# Patient Record
Sex: Male | Born: 1960
Health system: Southern US, Community
[De-identification: ages and names within clinical notes are randomized; demographics above are authoritative.]

## PROBLEM LIST (undated history)

## (undated) DIAGNOSIS — I10 Essential (primary) hypertension: Secondary | ICD-10-CM

## (undated) DIAGNOSIS — F1910 Other psychoactive substance abuse, uncomplicated: Secondary | ICD-10-CM

## (undated) DIAGNOSIS — Z972 Presence of dental prosthetic device (complete) (partial): Secondary | ICD-10-CM

## (undated) DIAGNOSIS — G8929 Other chronic pain: Secondary | ICD-10-CM

## (undated) DIAGNOSIS — E7800 Pure hypercholesterolemia, unspecified: Secondary | ICD-10-CM

## (undated) DIAGNOSIS — K5100 Ulcerative (chronic) pancolitis without complications: Secondary | ICD-10-CM

## (undated) DIAGNOSIS — M7918 Myalgia, other site: Secondary | ICD-10-CM

## (undated) DIAGNOSIS — N189 Chronic kidney disease, unspecified: Secondary | ICD-10-CM

## (undated) HISTORY — DX: Presence of dental prosthetic device (complete) (partial): Z97.2

## (undated) HISTORY — PX: MULTIPLE TOOTH EXTRACTIONS: SHX2053

---

## 2011-08-31 ENCOUNTER — Emergency Department (HOSPITAL_COMMUNITY)
Admission: EM | Admit: 2011-08-31 | Discharge: 2011-08-31 | Disposition: A | Discharge status: Home / Self Care | Payer: Self-pay | Attending: Emergency Medicine | Admitting: Emergency Medicine

## 2011-08-31 ENCOUNTER — Encounter (HOSPITAL_COMMUNITY): Payer: Self-pay | Admitting: Emergency Medicine

## 2011-08-31 DIAGNOSIS — F1010 Alcohol abuse, uncomplicated: Secondary | ICD-10-CM | POA: Insufficient documentation

## 2011-08-31 DIAGNOSIS — E7800 Pure hypercholesterolemia, unspecified: Secondary | ICD-10-CM | POA: Insufficient documentation

## 2011-08-31 DIAGNOSIS — F10929 Alcohol use, unspecified with intoxication, unspecified: Secondary | ICD-10-CM

## 2011-08-31 DIAGNOSIS — I10 Essential (primary) hypertension: Secondary | ICD-10-CM | POA: Insufficient documentation

## 2011-08-31 DIAGNOSIS — Y92410 Unspecified street and highway as the place of occurrence of the external cause: Secondary | ICD-10-CM | POA: Insufficient documentation

## 2011-08-31 HISTORY — DX: Essential (primary) hypertension: I10

## 2011-08-31 HISTORY — DX: Pure hypercholesterolemia, unspecified: E78.00
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
## 2012-10-15 ENCOUNTER — Encounter (HOSPITAL_COMMUNITY): Payer: Self-pay

## 2012-10-15 ENCOUNTER — Emergency Department (HOSPITAL_COMMUNITY)
Admission: EM | Admit: 2012-10-15 | Discharge: 2012-10-15 | Disposition: A | Discharge status: Home Health | Payer: Self-pay | Source: Home / Self Care

## 2012-10-15 DIAGNOSIS — G589 Mononeuropathy, unspecified: Secondary | ICD-10-CM

## 2012-10-15 DIAGNOSIS — G629 Polyneuropathy, unspecified: Secondary | ICD-10-CM

## 2012-10-15 MED ORDER — PREGABALIN 50 MG PO CAPS: 50.0000 mg | ORAL_CAPSULE | Freq: Two times a day (BID) | ORAL | Status: DC

## 2012-10-15 NOTE — ED Provider Notes (Signed)
History     CSN: [id number]  Arrival date & time 10/15/12  1511   First MD Initiated Contact with Patient 10/15/12 1528      Chief Complaint  Patient presents with  . Leg Pain  . Foot Pain    (Consider location/radiation/quality/duration/timing/severity/associated sxs/prior treatment) HPI Patient is 52 year old male who presents to clinic with main concern of lower extremity numbness and tingling that initially started one year prior to this visit. He explains that this gets worse with ambulation and prolonged standing and somewhat relieved with rest. Patient denies any specific focal neurological symptoms, no recent traumas to the areas, no drug use, no medicines over-the-counter, no alcohol or tobacco use. Patient denies fevers and chills, no chest pain or shortness of breath, no abdominal or urinary concerns. Past Medical History  Diagnosis Date  . Hypertension   . High cholesterol     History reviewed. No pertinent past surgical history.  Family history of high blood pressure  History  Substance Use Topics  . Smoking status: Current Some Day Smoker  . Smokeless tobacco: Not on file  . Alcohol Use: Yes      Review of Systems  Constitutional: Negative for fever, chills, diaphoresis, activity change, appetite change and fatigue.  HENT: Negative for ear pain, nosebleeds, congestion, facial swelling, rhinorrhea, neck pain, neck stiffness and ear discharge.   Eyes: Negative for pain, discharge, redness, itching and visual disturbance.  Respiratory: Negative for cough, choking, chest tightness, shortness of breath, wheezing and stridor.   Cardiovascular: Negative for chest pain, palpitations and leg swelling.  Gastrointestinal: Negative for abdominal distention.  Genitourinary: Negative for dysuria, urgency, frequency, hematuria, flank pain, decreased urine volume, difficulty urinating and dyspareunia.  Musculoskeletal: Negative for back pain, joint swelling, arthralgias  and gait problem.  Neurological: Negative for dizziness, tremors, seizures, syncope, facial asymmetry, speech difficulty, weakness, light-headedness, and headaches.  Hematological: Negative for adenopathy. Does not bruise/bleed easily.  Psychiatric/Behavioral: Negative for hallucinations, behavioral problems, confusion, dysphoric mood, decreased concentration and agitation.    Allergies  Review of patient's allergies indicates no known allergies.  Home Medications   Current Outpatient Rx  Name  Route  Sig  Dispense  Refill  . acetaminophen (TYLENOL) 500 MG tablet   Oral   Take 1,000 mg by mouth every 6 (six) hours as needed. For pain         . Multiple Vitamin (MULITIVITAMIN WITH MINERALS) TABS   Oral   Take 1 tablet by mouth daily.         [name] OVER THE COUNTER MEDICATION   Both Eyes   Place 1-2 drops into both eyes daily as needed. For dry/red eyes  [employer] eye drops         . pregabalin (LYRICA) 50 MG capsule   Oral   Take 1 capsule (50 mg total) by mouth 2 (two) times daily.   60 capsule   1     BP 110/78  Pulse 83  Temp(Src) 98 F (36.7 C) (Oral)  Resp 16  SpO2 100%  Physical Exam  Constitutional: Appears well-developed and well-nourished. No distress.  HENT: Normocephalic. External right and left ear normal. Oropharynx is clear and moist.  Eyes: Conjunctivae and EOM are normal. PERRLA, no scleral icterus.  Neck: Normal ROM. Neck supple. No JVD. No tracheal deviation. No thyromegaly.  CVS: RRR, S1/S2 +, no murmurs, no gallops, no carotid bruit.  Pulmonary: Effort and breath sounds normal, no stridor, rhonchi, wheezes, rales.  Abdominal: Soft. BS +,  no distension, tenderness, rebound or guarding.  Musculoskeletal: Normal range of motion. No edema and no tenderness.  Lymphadenopathy: No lymphadenopathy noted, cervical, inguinal. Neuro: Alert. Normal reflexes, muscle tone coordination. No cranial nerve deficit. Skin: Skin is warm and dry. No rash  noted. Not diaphoretic. No erythema. No pallor.  Psychiatric: Normal mood and affect. Behavior, judgment, thought content normal.    ED Course  Procedures (including critical care time)  Labs Reviewed - No data to display No results found.   1. Neuropathy   - patient symptoms suggestive of neuropathy but unclear foot provoking factor is - Referral to neurology will be made, patient started on Lyrica to take twice daily for now    MDM  Neuropathy        Dorothea Ogle, MD 10/15/12 [phone]

## 2012-10-15 NOTE — ED Notes (Signed)
Patient complains of pain in both legs and feet Has been going on for about 6 months

## 2012-10-16 NOTE — ED Notes (Signed)
Patient has an appt [facility] June 26 @ 1:45pm

## 2012-12-14 ENCOUNTER — Emergency Department (HOSPITAL_COMMUNITY)
Admission: EM | Admit: 2012-12-14 | Discharge: 2012-12-14 | Disposition: A | Discharge status: Home / Self Care | Payer: Self-pay | Attending: Emergency Medicine | Admitting: Emergency Medicine

## 2012-12-14 ENCOUNTER — Encounter (HOSPITAL_COMMUNITY): Payer: Self-pay | Admitting: *Deleted

## 2012-12-14 DIAGNOSIS — I10 Essential (primary) hypertension: Secondary | ICD-10-CM | POA: Insufficient documentation

## 2012-12-14 DIAGNOSIS — M795 Residual foreign body in soft tissue: Secondary | ICD-10-CM | POA: Insufficient documentation

## 2012-12-14 DIAGNOSIS — E7800 Pure hypercholesterolemia, unspecified: Secondary | ICD-10-CM | POA: Insufficient documentation

## 2012-12-14 DIAGNOSIS — F17200 Nicotine dependence, unspecified, uncomplicated: Secondary | ICD-10-CM | POA: Insufficient documentation

## 2012-12-14 MED ORDER — CEPHALEXIN 500 MG PO CAPS: 500.0000 mg | ORAL_CAPSULE | Freq: Four times a day (QID) | ORAL | Status: DC

## 2012-12-14 MED ORDER — IBUPROFEN 800 MG PO TABS: 800.0000 mg | ORAL_TABLET | Freq: Three times a day (TID) | ORAL | Status: DC

## 2012-12-14 MED ORDER — TETANUS-DIPHTH-ACELL PERTUSSIS 5-2.5-18.5 LF-MCG/0.5 IM SUSP
0.5000 mL | Freq: Once | INTRAMUSCULAR | Status: AC
  Administered 2012-12-14: 0.5 mL via INTRAMUSCULAR
  Filled 2012-12-14: qty 0.5

## 2012-12-14 NOTE — ED Provider Notes (Signed)
History     CSN: [id number]  Arrival date & time 12/14/12  0435   First MD Initiated Contact with Patient 12/14/12 0544      Chief Complaint  Patient presents with  . Puncture Wound    (Consider location/radiation/quality/duration/timing/severity/associated sxs/prior treatment) HPI HX per PT - getting ready to go fishing ant got a fish hook stuck in his R thumb - unable to remove it at home. Sharp mod pain not radiating, last tet unk, no other injury.  Past Medical History  Diagnosis Date  . Hypertension   . High cholesterol     No past surgical history on file.  No family history on file.  History  Substance Use Topics  . Smoking status: Current Some Day Smoker  . Smokeless tobacco: Not on file  . Alcohol Use: Yes      Review of Systems  Constitutional: Negative for fever and chills.  HENT: Negative for neck pain and neck stiffness.   Respiratory: Negative for shortness of breath.   Cardiovascular: Negative for chest pain.  Gastrointestinal: Negative for abdominal pain.  Musculoskeletal: Negative for back pain.  Skin: Positive for wound. Negative for rash.  Neurological: Negative for headaches.  All other systems reviewed and are negative.    Allergies  Review of patient's allergies indicates no known allergies.  Home Medications   Current Outpatient Rx  Name  Route  Sig  Dispense  Refill  . cephALEXin (KEFLEX) 500 MG capsule   Oral   Take 1 capsule (500 mg total) by mouth 4 (four) times daily.   40 capsule   0   . ibuprofen (ADVIL,MOTRIN) 800 MG tablet   Oral   Take 1 tablet (800 mg total) by mouth 3 (three) times daily.   21 tablet   0     BP 100/62  Pulse 95  Temp(Src) 97.3 F (36.3 C) (Oral)  Resp 14  SpO2 95%  Physical Exam  Constitutional: He is oriented to person, place, and time. He appears well-developed and well-nourished.  HENT:  Head: Normocephalic and atraumatic.  Eyes: EOM are normal. Pupils are equal, round, and  reactive to light.  Neck: Trachea normal. Neck supple.  Cardiovascular: Normal rate, regular rhythm, S1 normal, S2 normal, intact distal pulses and normal pulses.   Pulses:      Radial pulses are 2+ on the right side, and 2+ on the left side.  Pulmonary/Chest: Effort normal and breath sounds normal. No respiratory distress. He has no wheezes. He has no rhonchi. He has no rales. He exhibits no tenderness.  Abdominal: Soft. Normal appearance and bowel sounds are normal. There is no tenderness. There is no CVA tenderness and negative Murphy's sign.  Musculoskeletal:  R thumb with barbed fish hook imbedded in soft tissue distal digit. Cap refill and sensorium intact.  Neurological: He is alert and oriented to person, place, and time.  Skin: Skin is warm and dry. No rash noted. He is not diaphoretic.  Psychiatric: His speech is normal.  Cooperative and appropriate    ED Course  FOREIGN BODY REMOVAL Date/Time: 12/14/2012 6:35 AM Performed by: Sunnie Nielsen Authorized by: Sunnie Nielsen Consent: Verbal consent obtained. Risks and benefits: risks, benefits and alternatives were discussed Consent given by: patient Patient understanding: patient states understanding of the procedure being performed Patient consent: the patient's understanding of the procedure matches consent given Procedure consent: procedure consent matches procedure scheduled Required items: required blood products, implants, devices, and special equipment available Patient identity confirmed: verbally  with patient Time out: Immediately prior to procedure a "time out" was called to verify the correct patient, procedure, equipment, support staff and site/side marked as required. Intake: R thumb. Anesthesia: local infiltration Local anesthetic: lidocaine 1% without epinephrine Anesthetic total: 2 ml Patient cooperative: yes Complexity: simple 1 objects recovered. Objects recovered: fish hook Post-procedure assessment: foreign  body removed Patient tolerance: Patient tolerated the procedure well with no immediate complications.   (including critical care time)  Hook cut with pliers and advanced thru skin - removed without difficulty  1. Soft tissues foreign body     Infx precautions, ABx, PT instructed to keep wound clean, dry and covered.   MDM  R thumb FB removed  Tetanus updated  VS and nursing notes reviewed/ considered        Sunnie Nielsen, MD 12/14/12 [phone]

## 2012-12-14 NOTE — ED Notes (Signed)
Got a fishing line hook stuck in his rt. Thumb, dorsal side. When getting in the truck. No bleeding.

## 2013-06-06 ENCOUNTER — Ambulatory Visit: Payer: Self-pay

## 2013-08-13 ENCOUNTER — Emergency Department (HOSPITAL_COMMUNITY)
Admission: EM | Admit: 2013-08-13 | Discharge: 2013-08-13 | Disposition: A | Discharge status: Home / Self Care | Payer: Self-pay | Attending: Emergency Medicine | Admitting: Emergency Medicine

## 2013-08-13 ENCOUNTER — Emergency Department (HOSPITAL_COMMUNITY): Payer: Self-pay

## 2013-08-13 ENCOUNTER — Encounter (HOSPITAL_COMMUNITY): Payer: Self-pay | Admitting: Emergency Medicine

## 2013-08-13 DIAGNOSIS — E7800 Pure hypercholesterolemia, unspecified: Secondary | ICD-10-CM | POA: Insufficient documentation

## 2013-08-13 DIAGNOSIS — R109 Unspecified abdominal pain: Secondary | ICD-10-CM | POA: Insufficient documentation

## 2013-08-13 DIAGNOSIS — F17200 Nicotine dependence, unspecified, uncomplicated: Secondary | ICD-10-CM | POA: Insufficient documentation

## 2013-08-13 LAB — CBC WITH DIFFERENTIAL/PLATELET
Basophils Absolute: 0 10*3/uL (ref 0.0–0.1)
Basophils Relative: 0 % (ref 0–1)
Eosinophils Absolute: 0.1 10*3/uL (ref 0.0–0.7)
Eosinophils Relative: 1 % (ref 0–5)
HCT: 35.8 % — ABNORMAL LOW (ref 39.0–52.0)
Hemoglobin: 12.3 g/dL — ABNORMAL LOW (ref 13.0–17.0)
Lymphocytes Relative: 21 % (ref 12–46)
Lymphs Abs: 1.9 10*3/uL (ref 0.7–4.0)
MCH: 32.5 pg (ref 26.0–34.0)
MCHC: 34.4 g/dL (ref 30.0–36.0)
MCV: 94.5 fL (ref 78.0–100.0)
Monocytes Absolute: 0.9 10*3/uL (ref 0.1–1.0)
Monocytes Relative: 10 % (ref 3–12)
Neutro Abs: 6.2 10*3/uL (ref 1.7–7.7)
Neutrophils Relative %: 68 % (ref 43–77)
Platelets: 161 10*3/uL (ref 150–400)
RBC: 3.79 MIL/uL — ABNORMAL LOW (ref 4.22–5.81)
RDW: 14.8 % (ref 11.5–15.5)
WBC: 9.1 10*3/uL (ref 4.0–10.5)

## 2013-08-13 LAB — URINALYSIS, ROUTINE W REFLEX MICROSCOPIC
Bilirubin Urine: NEGATIVE
Glucose, UA: NEGATIVE mg/dL
Hgb urine dipstick: NEGATIVE
Ketones, ur: NEGATIVE mg/dL
Leukocytes, UA: NEGATIVE
Nitrite: NEGATIVE
Protein, ur: NEGATIVE mg/dL
Specific Gravity, Urine: 1.012 (ref 1.005–1.030)
Urobilinogen, UA: 0.2 mg/dL (ref 0.0–1.0)
pH: 5.5 (ref 5.0–8.0)

## 2013-08-13 LAB — COMPREHENSIVE METABOLIC PANEL
ALT: 246 U/L — ABNORMAL HIGH (ref 0–53)
AST: 424 U/L — ABNORMAL HIGH (ref 0–37)
Albumin: 3.2 g/dL — ABNORMAL LOW (ref 3.5–5.2)
Alkaline Phosphatase: 32 U/L — ABNORMAL LOW (ref 39–117)
BUN: 11 mg/dL (ref 6–23)
CO2: 21 mEq/L (ref 19–32)
Calcium: 8.1 mg/dL — ABNORMAL LOW (ref 8.4–10.5)
Chloride: 88 mEq/L — ABNORMAL LOW (ref 96–112)
Creatinine, Ser: 0.68 mg/dL (ref 0.50–1.35)
GFR calc Af Amer: >90 mL/min (ref >90)
GFR calc non Af Amer: >90 mL/min (ref >90)
Glucose, Bld: 117 mg/dL — ABNORMAL HIGH (ref 70–99)
Potassium: 7.7 mEq/L (ref 3.7–5.3)
Sodium: 127 mEq/L — ABNORMAL LOW (ref 137–147)
Total Bilirubin: 1.1 mg/dL (ref 0.3–1.2)
Total Protein: 8.5 g/dL — ABNORMAL HIGH (ref 6.0–8.3)

## 2013-08-13 LAB — LIPASE, BLOOD: Lipase: 69 U/L — ABNORMAL HIGH (ref 11–59)

## 2013-08-13 MED ORDER — OXYCODONE-ACETAMINOPHEN 5-325 MG PO TABS: 1.0000 | ORAL_TABLET | ORAL | Status: DC | PRN

## 2013-08-13 MED ORDER — MORPHINE SULFATE 4 MG/ML IJ SOLN
4.0000 mg | Freq: Once | INTRAMUSCULAR | Status: AC
  Administered 2013-08-13: 4 mg via INTRAVENOUS
  Filled 2013-08-13: qty 1

## 2013-08-13 MED ORDER — SODIUM CHLORIDE 0.9 % IV BOLUS (SEPSIS)
1000.0000 mL | Freq: Once | INTRAVENOUS | Status: AC
  Administered 2013-08-13: 1000 mL via INTRAVENOUS

## 2013-08-13 MED ORDER — ONDANSETRON HCL 4 MG/2ML IJ SOLN
4.0000 mg | Freq: Once | INTRAMUSCULAR | Status: AC
  Administered 2013-08-13: 4 mg via INTRAVENOUS
  Filled 2013-08-13: qty 2

## 2013-08-13 MED ORDER — ACETAMINOPHEN 325 MG PO TABS
650.0000 mg | ORAL_TABLET | Freq: Once | ORAL | Status: AC
  Administered 2013-08-13: 650 mg via ORAL
  Filled 2013-08-13: qty 2

## 2013-08-13 NOTE — ED Notes (Signed)
Pt given urinal and made aware of need for urine specimen 

## 2013-08-13 NOTE — ED Notes (Signed)
Pt still unable to void at this time 

## 2013-08-13 NOTE — Discharge Instructions (Signed)
Flank Pain Flank pain refers to pain that is located on the side of the body between the upper abdomen and the back. The pain may occur over a short period of time (acute) or may be long-term or reoccurring (chronic). It may be mild or severe. Flank pain can be caused by many things. CAUSES  Some of the more common causes of flank pain include:  Muscle strains.   Muscle spasms.   A disease of your spine (vertebral disk disease).   A lung infection (pneumonia).   Fluid around your lungs (pulmonary edema).   A kidney infection.   Kidney stones.   A very painful skin rash caused by the chickenpox virus (shingles).   Gallbladder disease.  [place] care will depend on the cause of your pain. In general,  Rest as directed by your caregiver.  Drink enough fluids to keep your urine clear or pale yellow.  Only take over-the-counter or prescription medicines as directed by your caregiver. Some medicines may help relieve the pain.  Tell your caregiver about any changes in your pain.  Follow up with your caregiver as directed. SEEK IMMEDIATE MEDICAL CARE IF:   Your pain is not controlled with medicine.   You have new or worsening symptoms.  Your pain increases.   You have abdominal pain.   You have shortness of breath.   You have persistent nausea or vomiting.   You have swelling in your abdomen.   You feel faint or pass out.   You have blood in your urine.  You have a fever or persistent symptoms for more than 2 3 days.  You have a fever and your symptoms suddenly get worse. MAKE SURE YOU:   Understand these instructions.  Will watch your condition.  Will get help right away if you are not doing well or get worse. Document Released: 08/10/2005 Document Revised: 03/13/2012 Document Reviewed: 02/01/2012 [facility] Patient Information 2014 [place].   Emergency Department Resource Guide 1) Find a Doctor and Pay Out of  Pocket Although you won't have to find out who is covered by your insurance plan, it is a good idea to ask around and get recommendations. You will then need to call the office and see if the doctor you have chosen will accept you as a new patient and what types of options they offer for patients who are self-pay. Some doctors offer discounts or will set up payment plans for their patients who do not have insurance, but you will need to ask so you aren't surprised when you get to your appointment.  2) Contact Your Local Health Department Not all health departments have doctors that can see patients for sick visits, but many do, so it is worth a call to see if yours does. If you don't know where your local health department is, you can check in your phone book. The CDC also has a tool to help you locate your state's health department, and many state websites also have listings of all of their local health departments.  3) Find a [facility] If your illness is not likely to be very severe or complicated, you may want to try a walk in clinic. These are popping up all over the country in pharmacies, drugstores, and shopping centers. They're usually staffed by nurse practitioners or physician assistants that have been trained to treat common illnesses and complaints. They're usually fairly quick and inexpensive. However, if you have serious medical issues or chronic  medical problems, these are probably not your best option.  No Primary Care Doctor: - Call Health Connect at  [phone] - they can help you locate a primary care doctor that  accepts your insurance, provides certain services, etc. - Physician Referral Service- [phone]  Chronic Pain Problems: Organization         Address  Phone   Notes  [facility]  [phone] Patients need to be referred by their primary care doctor.   Medication Assistance: Organization         Address  Phone   Notes  [facility]  Medication [facility] [address] [place], [place] [address] [phone] --Must be a resident of [facility] -- Must have NO insurance coverage whatsoever (no Medicaid/ Medicare, etc.) -- The pt. MUST have a primary care doctor that directs their care regularly and follows them in the community   MedAssist  [phone]   [employer]  [phone]    Agencies that provide inexpensive medical care: Organization         Address  Phone   Notes  [place]  [phone]   [facility]    [phone]   [facility] [place], [place] [address] [phone]   [place] [address], [place] [phone]   Planned Parenthood    [phone]   [facility]    [phone]   [place] and [address], [place] Phone:  [phone], Fax:  [phone] Hours of Operation:  9 am - 6 pm, M-F.  Also accepts Medicaid/Medicare and self-pay.  [facility] for [place], [address], [place] Phone: [phone], Fax: [phone]. Hours of Operation:  8:30 am - 5:30 pm, M-F.  Also accepts Medicaid and self-pay.  [facility] [place] [address] [place] Phone: [phone]   [place], [place], [place] [phone], Ext. [phone] Mondays & Thursdays: 7-9 AM.  First 15 patients are seen on a first come, first serve basis.    [place] Providers:  Organization         Address  Phone   Notes  [facility] [address], [place] [phone] Also accepts self-pay patients.  [facility] [address], [place]  [phone]   [place], [address], [place] [phone]   [facility] [address], [place] [phone]   Lucianne Lei [address], [place]   [phone] Only accepts [place] Access [place] patients after they have their name applied to their card.   Self-Pay (no insurance) in [facility]:  Organization         Address  Phone   Notes  Sickle Cell Patients, [facility] [place] [phone]   [facility] [place] [phone]   [facility] [place]  [place], [place], [place] [phone]   [facility]/Dr. Osei-Bonsu  [address], [employer] or [address], [place] [phone] Phone number for both [place] and [place] locations is the same.  Urgent  [facility] [address] [place] [phone]   [facility] [address], [place] or [address] [phone]   [facility] [address] [place] [phone], phone; [phone], fax Sees patients 1st and 3rd Saturday of every month.  Must not qualify for public or private insurance (i.e. Medicaid, Medicare, [place] Health Choice, Veterans' Benefits)  Household income should be no more than 200% of the poverty level The clinic cannot treat you if you are pregnant or think you are pregnant  Sexually transmitted diseases are not treated at the clinic.    Dental Care: Organization         Address  Phone  Notes  [facility] Department of [facility] [phone] Accepts children up to age 76 who are enrolled in [place] or [place]; pregnant women with a Medicaid card; and children who have applied for Medicaid or [place] Health Choice, but were declined, whose parents can pay a reduced fee at time of service.  [facility] Department of [facility]  [address], [place]  [phone] Accepts children up to age 91 who are enrolled in [place] or [place]; pregnant women with a Medicaid card; and children who have applied for Medicaid or [place] Health Choice, but were declined, whose parents can pay a reduced fee at time of service.  [place] Adult Dental Access PROGRAM  [place] [phone] Patients are seen by appointment only. Walk-ins are not accepted. [place] will see patients 18 years of age and older. Monday - Tuesday (8am-5pm) Most Wednesdays (8:30-5pm) $30 per visit, cash only  [facility] Adult Dental Access PROGRAM  [address], [facility] [phone] Patients are seen by appointment only. Walk-ins are not accepted. [place] will see patients 88 years of age and older. One Wednesday Evening (Monthly: Volunteer Based).  $30 per visit, cash only  [place]  [phone] for adults; Children under age 29, call Graduate Pediatric Dentistry at [phone]. Children aged 52-14, please call [phone] to request a pediatric application.  Dental services are provided in all areas of dental care including fillings, crowns and bridges, complete and partial dentures, implants, gum treatment, root canals, and extractions. Preventive care is also provided. Treatment is provided to both adults and children. Patients are selected via a lottery and there is often a waiting list.   [facility] [address] [place]  [phone] [email or web address]   [facility] [address] [place], [place] [phone], Ext. [phone] Second and Fourth Thursday of each month, opens at 6:30 AM; Clinic ends at 9 AM.  Patients are seen on a first-come first-served basis, and a limited number are seen during each clinic.   [facility]  [address] Hillard Danker [place], [place] [phone]   Eligibility Requirements You must have lived in [place], [place], or [place]  counties for at least the last three months.   You cannot be eligible for state or federal sponsored [employer], including [employer], [place], or [employer].   You generally cannot be eligible for healthcare insurance through your employer.    How to apply: Eligibility screenings are held every Tuesday and Wednesday afternoon from 1:00 pm until 4:00 pm. You do not need an appointment for the interview!  [facility] [address] [place], [place]   [place]  Daniel Department  [place]  [phone]    Behavioral Health Resources in the Community: Intensive Outpatient Programs Organization         Address  Phone  Notes  [place]. [address] [place], [place] [phone]   [facility] [address] [place], [place]   ADS: Alcohol & Drug Svcs [address] [place], [place]   [place] [address]  [place], [place] or [phone]   Substance Abuse Resources Organization         Address  Phone  Notes  Alcohol and Drug Services  [phone]   [place]  [phone]   [facility] [place]   Chinita Pester  [phone]   Residential & Outpatient Substance Abuse Program  [phone]   Psychological Services Organization         Address  Phone  Notes  [facility] [place]  [place]  [phone]   [place] [address], [place] or [phone]    Mobile Crisis Teams Organization         Address  Phone  Notes  [facility], Mobile Crisis Care Unit  [phone]   Assertive Psychotherapeutic Services  [address] [place], [place]   [place] [address], [place] [phone]    Self-Help/Support Groups Organization         Address  Phone             Notes  [place]. of [place] - variety of support groups  [place] Call for more information  Narcotics Anonymous (NA), [facility] [address], [employer] [place]  2 meetings at this location   Special educational needs teacher         Address  Phone  Notes  [facility] Residential Treatment [place],    [place]  [phone]   [facility]  [address], [place] [id number], [place], [place]   [place], [place] [phone] Admissions: 8am-3pm M-F  Incentives Substance [place] [address],    [place], [place] [phone]   [facility] [address] [place], [place], [place]   [facility] [address],  [place], [place]   [facility] Programs - Intensive Outpatient [address] Kristeen Mans [address], [place], [place]   [facility] ([place] [address]  [place], [place] [phone] or [phone]   [facility]idential Treatment Services (RTS) [address], [place], [place] Accepts Medicaid  [facility] [place] [address],  [place] [phone] Substance Abuse/Addiction Treatment   [facility] Organization         Address  Phone  Notes  [facility]  [phone]   Domenic Schwab, PhD [address] Arlis Porta [place], [place]   [phone] or [phone]   Burna [place], [place] [phone]   [place] [address] [place], [place] [phone] Insurance/Medicaid/sponsorship through [employer] and Families [address], [id number]  [place], [place] [phone] [address] [place], [place] [phone]    Dr. Adele Schilder  [phone]   [facility] Dept. 1) [address] [place] 2) [place] 3)  [address], [place] [phone]  [phone]   [place] [phone] or [phone] (After Hours)

## 2013-08-13 NOTE — ED Provider Notes (Signed)
CSN: [id number]     Arrival date & time 08/13/13  2831 History   First MD Initiated Contact with Patient 08/13/13 [phone]     Chief Complaint  Patient presents with  . Abdominal Pain     (Consider location/radiation/quality/duration/timing/severity/associated sxs/prior Treatment) HPI  53 year old male with abdominal pain. Left-sided. Gradual onset on Sunday and progressively worsening. Pain is relatively constant. Worse with coughing and deep inspiration. No fevers or chills. No nausea or vomiting. Initially thought he was constipated took a laxative. He subsequently had a bowel movement without change in his symptoms. No urinary complaints. No history similar symptoms. No history of prior abdominal surgery.    Past Medical History  Diagnosis Date  . High cholesterol    History reviewed. No pertinent past surgical history. No family history on file. History  Substance Use Topics  . Smoking status: Current Some Day Smoker  . Smokeless tobacco: Not on file  . Alcohol Use: Yes    Review of Systems  All systems reviewed and negative, other than as noted in HPI.    Allergies  Review of patient's allergies indicates no known allergies.  Home Medications   Current Outpatient Rx  Name  Route  Sig  Dispense  Refill  . naproxen sodium (ANAPROX) 220 MG tablet   Oral   Take 440 mg by mouth once.          BP 129/87  Pulse 91  Temp(Src) 99.7 F (37.6 C) (Oral)  Resp 20  Ht 6' (1.829 m)  Wt 180 lb (81.647 kg)  BMI 24.41 kg/m2  SpO2 99% Physical Exam  Nursing note and vitals reviewed. Constitutional: He appears well-developed and well-nourished. No distress.  HENT:  Head: Normocephalic and atraumatic.  Eyes: Conjunctivae are normal. Right eye exhibits no discharge. Left eye exhibits no discharge.  Neck: Neck supple.  Cardiovascular: Normal rate, regular rhythm and normal heart sounds.  Exam reveals no gallop and no friction rub.   No murmur heard. Pulmonary/Chest: Effort  normal and breath sounds normal. No respiratory distress.  Abdominal: Soft. He exhibits no distension and no mass. There is tenderness. There is no rebound and no guarding.  L flank tenderness. No rebound or guarding  Genitourinary:  L CVA tenderness  Musculoskeletal: He exhibits no edema and no tenderness.  Neurological: He is alert.  Skin: Skin is warm and dry.  Psychiatric: He has a normal mood and affect. His behavior is normal. Thought content normal.    ED Course  Procedures (including critical care time) Labs Review Labs Reviewed  CBC WITH DIFFERENTIAL - Abnormal; Notable for the following:    RBC 3.79 (*)    Hemoglobin 12.3 (*)    HCT 35.8 (*)    All other components within normal limits  COMPREHENSIVE METABOLIC PANEL - Abnormal; Notable for the following:    Sodium 127 (*)    Potassium 7.7 (*)    Chloride 88 (*)    Glucose, Bld 117 (*)    Calcium 8.1 (*)    Total Protein 8.5 (*)    Albumin 3.2 (*)    AST 424 (*)    ALT 246 (*)    Alkaline Phosphatase 32 (*)    All other components within normal limits  LIPASE, BLOOD - Abnormal; Notable for the following:    Lipase 69 (*)    All other components within normal limits  URINALYSIS, ROUTINE W REFLEX MICROSCOPIC  CBC WITH DIFFERENTIAL   Imaging Review No results found.  EKG Interpretation  Date/Time:  Wednesday August 13 2013 11:06:44 EST Ventricular Rate:  72 PR Interval:  146 QRS Duration: 86 QT Interval:  419 QTC Calculation: 458 R Axis:   72 Text Interpretation:  Sinus rhythm Consider left ventricular hypertrophy ED PHYSICIAN INTERPRETATION AVAILABLE IN [facility] Reconfirmed by Wilson Singer  MD, Jayse Hodkinson ([id number]) on 08/15/2013 2:37:22 PM            MDM   Final diagnoses:  Left lateral abdominal pain    52ym with L abdominal pain. Among of diagnoses, consider renal colic, uti particularly with L CVA tenderness. Consider L lower lobe pulmonary process such as PE or pneumonia with pleuritic nature.  No respiratory complaints. Consider diverticulitis although pain more in flank.  Will check UA, labs and CXR. Symptomatic tx.   W/u significant for hyperK and abnormal LFTs. No EKG changes. Unfortunately hemolyzed specimen. Discussed with pt and that some of abnormalities my be from hemolysis, but may not account for all of it. Recommended repeat blood work. Questioning if necessary. Explained recommendations, but pt would rather go home. Pain significantly better. No R sided pain. No vomiting. I think this is reasonable but discussed the need for follow-up and repeat blood work within the week. Emergent return precautions discussed.     Virgel Manifold, MD 08/15/13 1438

## 2013-08-13 NOTE — ED Notes (Signed)
Pt states that he began to have left sided abd pain that began Sunday morning; pt states that the pain persistent and progressive since Sunday; pt states that he has vomited x 1 since Sunday; denies diarrhea; pt states he is not constipated

## 2013-08-13 NOTE — Progress Notes (Signed)
[facility] CL provided pt with a list of primary care resources, ACA information, and a [facility] [employer] application. Patient had an [employer] with [place] 11/22/12-05/25/13. CL set patient up with a re-enrollment apt on 2/25 at 11:30 am. Patient also asked CL to get him an appointment for [place]. Was unable to get patient an apt set up at this time but did provide him with contact information.

## 2013-08-27 ENCOUNTER — Ambulatory Visit: Payer: Self-pay | Attending: Internal Medicine

## 2014-03-30 ENCOUNTER — Ambulatory Visit: Payer: Self-pay | Attending: Family Medicine | Admitting: Family Medicine

## 2014-03-30 ENCOUNTER — Encounter: Payer: Self-pay | Admitting: Family Medicine

## 2014-03-30 VITALS — BP 116/76 | HR 75 | Temp 98.1°F | Resp 18 | Ht 72.0 in | Wt 203.0 lb

## 2014-03-30 DIAGNOSIS — R220 Localized swelling, mass and lump, head: Secondary | ICD-10-CM | POA: Insufficient documentation

## 2014-03-30 DIAGNOSIS — G629 Polyneuropathy, unspecified: Secondary | ICD-10-CM

## 2014-03-30 DIAGNOSIS — M79609 Pain in unspecified limb: Secondary | ICD-10-CM | POA: Insufficient documentation

## 2014-03-30 DIAGNOSIS — K055 Other periodontal diseases: Secondary | ICD-10-CM | POA: Insufficient documentation

## 2014-03-30 DIAGNOSIS — Z23 Encounter for immunization: Secondary | ICD-10-CM

## 2014-03-30 DIAGNOSIS — R221 Localized swelling, mass and lump, neck: Secondary | ICD-10-CM

## 2014-03-30 DIAGNOSIS — K047 Periapical abscess without sinus: Secondary | ICD-10-CM

## 2014-03-30 DIAGNOSIS — M545 Low back pain, unspecified: Secondary | ICD-10-CM | POA: Insufficient documentation

## 2014-03-30 DIAGNOSIS — R209 Unspecified disturbances of skin sensation: Secondary | ICD-10-CM | POA: Insufficient documentation

## 2014-03-30 DIAGNOSIS — G589 Mononeuropathy, unspecified: Secondary | ICD-10-CM

## 2014-03-30 DIAGNOSIS — K089 Disorder of teeth and supporting structures, unspecified: Secondary | ICD-10-CM | POA: Insufficient documentation

## 2014-03-30 LAB — POCT GLYCOSYLATED HEMOGLOBIN (HGB A1C): Hemoglobin A1C: 5.8

## 2014-03-30 LAB — GLUCOSE, POCT (MANUAL RESULT ENTRY): POC Glucose: 103 mg/dl — AB (ref 70–99)

## 2014-03-30 MED ORDER — AMOXICILLIN-POT CLAVULANATE 875-125 MG PO TABS: 1.0000 | ORAL_TABLET | Freq: Two times a day (BID) | ORAL | Status: DC

## 2014-03-30 MED ORDER — NAPROXEN 500 MG PO TABS: 500.0000 mg | ORAL_TABLET | Freq: Two times a day (BID) | ORAL | Status: DC

## 2014-03-30 NOTE — Progress Notes (Addendum)
   Subjective:    Patient ID: Stephen Bowers, male    DOB: [birth year], 53 y.o.   MRN: [id number] CC: establish care, Headache, dental pain with headache on L side, tingling/numbness in feet an anterior shin pain  HPI 53 year old male presents to establish care discussed the following:  #1 dental pain: Patient with 2-3 months of dental pain on the left side upper molar. Pain is associated with headache. Patient also has bleeding from his gums. Patient reports waking up in the mornings with blood in his mouth. Patient denies fever. Patient has intermittent swelling.  #2 tingling in lower extremities: Tingling and sharp pains are intermittent his both lower legs. Symptoms are from feet to anterior shins. Symptoms started one year ago and worsening. Symptoms started after patient stopped working. Patient does admit to intermittent low back pain. Low back pain is exacerbated by exercise.  Soc Hx: non smoker   Review of Systems As per HPI     Objective:   Physical Exam BP 116/76  Pulse 75  Temp(Src) 98.1 F (36.7 C) (Oral)  Resp 18  Ht 6' (1.829 m)  Wt 203 lb (92.08 kg)  BMI 27.53 kg/m2  SpO2 100% General appearance: alert, cooperative and no distress Throat: abnormal findings: dentition: multiple carries and gingivitis , L upper molar with surrounding gum swelling, gum is friable  Extremities: extremities normal, atraumatic, no cyanosis or edema  Lab Results  Component Value Date   HGBA1C 5.8 03/30/2014        Assessment & Plan:

## 2014-03-30 NOTE — Progress Notes (Signed)
Establish Care Requested Dental Referral Complaining of pain on  Rt  foot and leg, with numbness at time

## 2014-03-30 NOTE — Patient Instructions (Addendum)
Stephen Bowers,  Thank you for coming in today. It was a pleasure meeting you. I look forward to being your primary doctor.  1. For dental pain: Take augmentin due to high risk of abscess Naproxen for pain Referral place to dentistry you will be called wiht appointment details.   2. For foot and leg symptoms: I suspect low back source with referred pains Blood sugar and A1c normal I recommend increased exercise Weight loss, like 10-15 lbs  Oral antiinflammatory when needed  F/u with me when needed I do recommend you schedule a complete physical with me at your earliest convenience   Dr. Adrian Blackwater

## 2014-03-30 NOTE — Assessment & Plan Note (Signed)
1. For dental pain: Take augmentin due to high risk of abscess Naproxen for pain Referral place to dentistry you will be called wiht appointment details.

## 2014-03-30 NOTE — Assessment & Plan Note (Signed)
For foot and leg symptoms: I suspect low back source with referred pains Blood sugar and A1c normal I recommend increased exercise Weight loss, like 10-15 lbs  Oral antiinflammatory when needed

## 2014-06-19 ENCOUNTER — Emergency Department (HOSPITAL_COMMUNITY): Payer: Self-pay

## 2014-06-19 ENCOUNTER — Telehealth: Payer: Self-pay | Admitting: Family Medicine

## 2014-06-19 ENCOUNTER — Emergency Department (INDEPENDENT_AMBULATORY_CARE_PROVIDER_SITE_OTHER)
Admission: EM | Admit: 2014-06-19 | Discharge: 2014-06-19 | Disposition: A | Discharge status: Home / Self Care | Payer: Self-pay | Source: Home / Self Care | Attending: Family Medicine | Admitting: Family Medicine

## 2014-06-19 ENCOUNTER — Emergency Department (HOSPITAL_COMMUNITY)
Admission: EM | Admit: 2014-06-19 | Discharge: 2014-06-19 | Disposition: A | Discharge status: Home / Self Care | Payer: Self-pay | Attending: Emergency Medicine | Admitting: Emergency Medicine

## 2014-06-19 ENCOUNTER — Encounter (HOSPITAL_COMMUNITY): Payer: Self-pay | Admitting: Emergency Medicine

## 2014-06-19 ENCOUNTER — Encounter (HOSPITAL_COMMUNITY): Payer: Self-pay | Admitting: *Deleted

## 2014-06-19 DIAGNOSIS — R1011 Right upper quadrant pain: Secondary | ICD-10-CM

## 2014-06-19 DIAGNOSIS — R630 Anorexia: Secondary | ICD-10-CM | POA: Insufficient documentation

## 2014-06-19 DIAGNOSIS — Z8639 Personal history of other endocrine, nutritional and metabolic disease: Secondary | ICD-10-CM | POA: Insufficient documentation

## 2014-06-19 DIAGNOSIS — R109 Unspecified abdominal pain: Secondary | ICD-10-CM

## 2014-06-19 LAB — CBC WITH DIFFERENTIAL/PLATELET
BASOS ABS: 0.1 10*3/uL (ref 0.0–0.1)
Basophils Relative: 1 % (ref 0–1)
EOS PCT: 0 % (ref 0–5)
Eosinophils Absolute: 0 10*3/uL (ref 0.0–0.7)
HCT: 34.1 % — ABNORMAL LOW (ref 39.0–52.0)
Hemoglobin: 12.4 g/dL — ABNORMAL LOW (ref 13.0–17.0)
LYMPHS ABS: 2.1 10*3/uL (ref 0.7–4.0)
Lymphocytes Relative: 14 % (ref 12–46)
MCH: 29.5 pg (ref 26.0–34.0)
MCHC: 36.4 g/dL — ABNORMAL HIGH (ref 30.0–36.0)
MCV: 81.2 fL (ref 78.0–100.0)
MONOS PCT: 10 % (ref 3–12)
Monocytes Absolute: 1.5 10*3/uL — ABNORMAL HIGH (ref 0.1–1.0)
NEUTROS PCT: 75 % (ref 43–77)
Neutro Abs: 11.1 10*3/uL — ABNORMAL HIGH (ref 1.7–7.7)
PLATELETS: 188 10*3/uL (ref 150–400)
RBC: 4.2 MIL/uL — ABNORMAL LOW (ref 4.22–5.81)
RDW: 22 % — AB (ref 11.5–15.5)
WBC: 14.8 10*3/uL — AB (ref 4.0–10.5)

## 2014-06-19 LAB — URINALYSIS, ROUTINE W REFLEX MICROSCOPIC
BILIRUBIN URINE: NEGATIVE
Glucose, UA: NEGATIVE mg/dL
HGB URINE DIPSTICK: NEGATIVE
Ketones, ur: NEGATIVE mg/dL
Leukocytes, UA: NEGATIVE
NITRITE: NEGATIVE
PROTEIN: NEGATIVE mg/dL
SPECIFIC GRAVITY, URINE: 1.028 (ref 1.005–1.030)
UROBILINOGEN UA: 0.2 mg/dL (ref 0.0–1.0)
pH: 5.5 (ref 5.0–8.0)

## 2014-06-19 LAB — I-STAT CHEM 8, ED
BUN: 18 mg/dL (ref 6–23)
CREATININE: 1.1 mg/dL (ref 0.50–1.35)
Calcium, Ion: 1.04 mmol/L — ABNORMAL LOW (ref 1.12–1.23)
Chloride: 102 mEq/L (ref 96–112)
GLUCOSE: 109 mg/dL — AB (ref 70–99)
HCT: 42 % (ref 39.0–52.0)
Hemoglobin: 14.3 g/dL (ref 13.0–17.0)
POTASSIUM: 4.8 meq/L (ref 3.7–5.3)
SODIUM: 129 meq/L — AB (ref 137–147)
TCO2: 19 mmol/L (ref 0–100)

## 2014-06-19 LAB — LIPASE, BLOOD: Lipase: 224 U/L — ABNORMAL HIGH (ref 11–59)

## 2014-06-19 MED ORDER — HYDROMORPHONE HCL 1 MG/ML IJ SOLN
1.0000 mg | Freq: Once | INTRAMUSCULAR | Status: AC
  Administered 2014-06-19: 1 mg via INTRAVENOUS
  Filled 2014-06-19: qty 1

## 2014-06-19 MED ORDER — ONDANSETRON HCL 4 MG/2ML IJ SOLN
4.0000 mg | Freq: Once | INTRAMUSCULAR | Status: DC
  Filled 2014-06-19: qty 2

## 2014-06-19 MED ORDER — ONDANSETRON HCL 4 MG/2ML IJ SOLN
4.0000 mg | Freq: Once | INTRAMUSCULAR | Status: AC
  Administered 2014-06-19: 4 mg via INTRAVENOUS

## 2014-06-19 MED ORDER — HYDROCODONE-IBUPROFEN 7.5-200 MG PO TABS: 1.0000 | ORAL_TABLET | Freq: Four times a day (QID) | ORAL | Status: DC | PRN

## 2014-06-19 MED ORDER — ONDANSETRON 4 MG PO TBDP: 4.0000 mg | ORAL_TABLET | Freq: Three times a day (TID) | ORAL | Status: DC | PRN

## 2014-06-19 NOTE — ED Notes (Signed)
Reported to [place], [place] the delay in labs. Already called Ulyess Mort in main lab to run CMET stat.

## 2014-06-19 NOTE — ED Notes (Signed)
Provided chem 8 results to Dr. Johnney Killian, and reported patient request for pain medication.

## 2014-06-19 NOTE — ED Notes (Signed)
Blood specimen redrew and sent to lab for CMP and Lipase.

## 2014-06-19 NOTE — ED Provider Notes (Signed)
CSN: [id number]     Arrival date & time 06/19/14  1341 History   First MD Initiated Contact with Patient 06/19/14 1526     Chief Complaint  Patient presents with  . Abdominal Pain     (Consider location/radiation/quality/duration/timing/severity/associated sxs/prior Treatment) Patient is a 53 y.o. male presenting with abdominal pain. The history is provided by the patient. No language interpreter was used.  Abdominal Pain Pain location:  RUQ Pain quality: aching   Pain radiates to:  Does not radiate Pain severity:  Moderate Onset quality:  Gradual Duration:  2 days Timing:  Constant Progression:  Worsening Chronicity:  New Context: not sick contacts   Relieved by:  Nothing Worsened by:  Nothing tried Ineffective treatments:  None tried Associated symptoms: anorexia   Associated symptoms: no fever     Past Medical History  Diagnosis Date  . High cholesterol Dx 2011   History reviewed. No pertinent past surgical history. Family History  Problem Relation Age of Onset  . Liver disease Father   . Alcohol abuse Father   . Diabetes Sister   . Obesity Sister   . Cancer Mother     breast   . Heart disease Neg Hx    History  Substance Use Topics  . Smoking status: Never Smoker   . Smokeless tobacco: Never Used  . Alcohol Use: Yes     Comment: weekends, 3 drinks at a time     Review of Systems  Constitutional: Negative for fever.  Gastrointestinal: Positive for abdominal pain and anorexia.  All other systems reviewed and are negative.     Allergies  Review of patient's allergies indicates no known allergies.  Home Medications   Prior to Admission medications   Medication Sig Start Date End Date Taking? Authorizing Provider  ibuprofen (ADVIL,MOTRIN) 400 MG tablet Take 400 mg by mouth every 6 (six) hours as needed for mild pain or moderate pain.   Yes Historical Provider, MD  amoxicillin-clavulanate (AUGMENTIN) 875-125 MG per tablet Take 1 tablet by mouth 2  (two) times daily. Patient not taking: Reported on 06/19/2014 03/30/14   Minerva Ends, MD  naproxen (NAPROSYN) 500 MG tablet Take 1 tablet (500 mg total) by mouth 2 (two) times daily with a meal. Patient not taking: Reported on 06/19/2014 03/30/14   Josalyn C Funches, MD   BP 135/85 mmHg  Pulse 114  Temp(Src) 98.6 F (37 C) (Oral)  Resp 20  SpO2 97% Physical Exam  Constitutional: He is oriented to person, place, and time. He appears well-developed and well-nourished.  HENT:  Head: Normocephalic.  Right Ear: External ear normal.  Left Ear: External ear normal.  Eyes: Conjunctivae and EOM are normal. Pupils are equal, round, and reactive to light.  Neck: Normal range of motion.  Cardiovascular: Normal rate and normal heart sounds.   Pulmonary/Chest: Effort normal.  Abdominal: Soft. He exhibits no distension. There is tenderness.  Musculoskeletal: Normal range of motion.  Neurological: He is alert and oriented to person, place, and time.  Skin: Skin is warm.  Psychiatric: He has a normal mood and affect.  Nursing note and vitals reviewed.   ED Course  Procedures (including critical care time) Labs Review Labs Reviewed  CBC WITH DIFFERENTIAL - Abnormal; Notable for the following:    WBC 14.8 (*)    RBC 4.20 (*)    Hemoglobin 12.4 (*)    HCT 34.1 (*)    MCHC 36.4 (*)    RDW 22.0 (*)  Neutro Abs 11.1 (*)    Monocytes Absolute 1.5 (*)    All other components within normal limits  URINALYSIS, ROUTINE W REFLEX MICROSCOPIC  COMPREHENSIVE METABOLIC PANEL  LIPASE, BLOOD    Imaging Review No results found.   EKG Interpretation None      MDM  Pt sent here for evaluation of abdominal pain from Urgent care.  Pt has pain in right upper quadrant.   Final diagnoses:  Abdominal pain        Fransico Meadow, PA-C 06/19/14 [place], MD 06/19/14 [phone]

## 2014-06-19 NOTE — Discharge Instructions (Signed)
Please follow up with your primary care physician in 1-2 days. If you do not have one please call the [place] number listed above. Please follow up with the gastroenterologist for evaluation of your abdominal pain to schedule a follow up appointment. Please take pain medication and/or muscle relaxants as prescribed and as needed for pain. Please do not drive on narcotic pain medication or on muscle relaxants. Please read all discharge instructions and return precautions.    Abdominal Pain Many things can cause abdominal pain. Usually, abdominal pain is not caused by a disease and will improve without treatment. It can often be observed and treated at home. Your health care provider will do a physical exam and possibly order blood tests and X-rays to help determine the seriousness of your pain. However, in many cases, more time must pass before a clear cause of the pain can be found. Before that point, your health care provider may not know if you need more testing or further treatment. HOME CARE INSTRUCTIONS  Monitor your abdominal pain for any changes. The following actions may help to alleviate any discomfort you are experiencing:  Only take over-the-counter or prescription medicines as directed by your health care provider.  Do not take laxatives unless directed to do so by your health care provider.  Try a clear liquid diet (broth, tea, or water) as directed by your health care provider. Slowly move to a bland diet as tolerated. SEEK MEDICAL CARE IF:  You have unexplained abdominal pain.  You have abdominal pain associated with nausea or diarrhea.  You have pain when you urinate or have a bowel movement.  You experience abdominal pain that wakes you in the night.  You have abdominal pain that is worsened or improved by eating food.  You have abdominal pain that is worsened with eating fatty foods.  You have a fever. SEEK IMMEDIATE MEDICAL CARE IF:   Your pain  does not go away within 2 hours.  You keep throwing up (vomiting).  Your pain is felt only in portions of the abdomen, such as the right side or the left lower portion of the abdomen.  You pass bloody or black tarry stools. MAKE SURE YOU:  Understand these instructions.   Will watch your condition.   Will get help right away if you are not doing well or get worse.  Document Released: 03/29/2005 Document Revised: 06/24/2013 Document Reviewed: 02/26/2013 [facility] Patient Information 2015 [place], [place]. This information is not intended to replace advice given to you by your health care provider. Make sure you discuss any questions you have with your health care provider.

## 2014-06-19 NOTE — ED Notes (Addendum)
Contacted lab regarding delay in CMET results, states will be approx 10 more minutes until results are available.

## 2014-06-19 NOTE — ED Provider Notes (Signed)
Patient care acquired from Alyse Low, PA-C pending lab results with likely plan to DC home.  Results for orders placed or performed during the hospital encounter of 06/19/14  CBC with Differential  Result Value Ref Range   WBC 14.8 (H) 4.0 - 10.5 K/uL   RBC 4.20 (L) 4.22 - 5.81 MIL/uL   Hemoglobin 12.4 (L) 13.0 - 17.0 g/dL   HCT 34.1 (L) 39.0 - 52.0 %   MCV 81.2 78.0 - 100.0 fL   MCH 29.5 26.0 - 34.0 pg   MCHC 36.4 (H) 30.0 - 36.0 g/dL   RDW 22.0 (H) 11.5 - 15.5 %   Platelets 188 150 - 400 K/uL   Neutrophils Relative % 75 43 - 77 %   Lymphocytes Relative 14 12 - 46 %   Monocytes Relative 10 3 - 12 %   Eosinophils Relative 0 0 - 5 %   Basophils Relative 1 0 - 1 %   Neutro Abs 11.1 (H) 1.7 - 7.7 K/uL   Lymphs Abs 2.1 0.7 - 4.0 K/uL   Monocytes Absolute 1.5 (H) 0.1 - 1.0 K/uL   Eosinophils Absolute 0.0 0.0 - 0.7 K/uL   Basophils Absolute 0.1 0.0 - 0.1 K/uL   RBC Morphology POLYCHROMASIA PRESENT    WBC Morphology VACUOLATED NEUTROPHILS   Urinalysis, Routine w reflex microscopic  Result Value Ref Range   Color, Urine YELLOW YELLOW   APPearance HAZY (A) CLEAR   Specific Gravity, Urine 1.028 1.005 - 1.030   pH 5.5 5.0 - 8.0   Glucose, UA NEGATIVE NEGATIVE mg/dL   Hgb urine dipstick NEGATIVE NEGATIVE   Bilirubin Urine NEGATIVE NEGATIVE   Ketones, ur NEGATIVE NEGATIVE mg/dL   Protein, ur NEGATIVE NEGATIVE mg/dL   Urobilinogen, UA 0.2 0.0 - 1.0 mg/dL   Nitrite NEGATIVE NEGATIVE   Leukocytes, UA NEGATIVE NEGATIVE  Lipase, blood  Result Value Ref Range   Lipase 224 (H) 11 - 59 U/L  I-Stat Chem 8, ED  Result Value Ref Range   Sodium 129 (L) 137 - 147 mEq/L   Potassium 4.8 3.7 - 5.3 mEq/L   Chloride 102 96 - 112 mEq/L   BUN 18 6 - 23 mg/dL   Creatinine, Ser 1.10 0.50 - 1.35 mg/dL   Glucose, Bld 109 (H) 70 - 99 mg/dL   Calcium, Ion 1.04 (L) 1.12 - 1.23 mmol/L   TCO2 19 0 - 100 mmol/L   Hemoglobin 14.3 13.0 - 17.0 g/dL   HCT 42.0 39.0 - 52.0 %   US Abdomen  Complete  06/19/2014   CLINICAL DATA:  Right-sided abdominal pain for 2 days.  EXAM: ULTRASOUND ABDOMEN COMPLETE  COMPARISON:  None.  FINDINGS: Gallbladder: No gallstones or wall thickening visualized. No sonographic Murphy sign noted.  Common bile duct: Diameter: 4 mm.  Liver: No focal lesion identified. Diffusely increased parenchymal echogenicity, liver parenchymal is difficult to penetrate.  IVC: No abnormality visualized.  Pancreas: Visualized portion unremarkable.  Spleen: Size and appearance within normal limits.  Right Kidney: Length: 12.2 cm. Echogenicity within normal limits. No mass or hydronephrosis visualized.  Left Kidney: Length: 11.5 cm. Echogenicity within normal limits. No mass or hydronephrosis visualized.  Abdominal aorta: No aneurysm visualized. Mid and distal aorta are obscured.  Other findings: No ascites.  IMPRESSION: Increased hepatic echogenicity, likely related to hepatic steatosis or chronic liver disease. Normal appearance of the gallbladder.   Electronically Signed   By: Jeb Levering M.D.   On: 06/19/2014 16:33   Unable to obtain CMP,  several lab samples sent to the lab with hemolysis. Chem 8 was obtained to ensure no electrolyte abnormalities.   1. Abdominal pain     I have reviewed nursing notes, vital signs, and all appropriate lab and imaging results for this patient.  Patient requesting discharge home. Advised gastroenterology follow up for evaluation of increased hepatic echogenicity with further evaluation of LFTs. Patient is agreeable to this plan. He is able to tolerate PO intake in the ED without difficulty. Patient is stable at time of discharge   Harlow Mares, PA-C 06/19/14 [place], MD 06/22/14 [phone]

## 2014-06-19 NOTE — ED Notes (Signed)
Called main lab and spoke to [place], she reports the 2nd CMET hemolyzed after recollection.

## 2014-06-19 NOTE — ED Notes (Signed)
Provided water as requested.

## 2014-06-19 NOTE — ED Notes (Signed)
Called nieka in main lab, still running.

## 2014-06-19 NOTE — ED Provider Notes (Signed)
Stephen Bowers is a 53 y.o. male who presents to Urgent Care today for right upper quadrant abdominal pain. Pain present for 2 days and occurs off and on. Pain can be quite severe and worse after eating. No fevers or chills vomiting or diarrhea. No chest pain or palpitations.   Past Medical History  Diagnosis Date  . High cholesterol Dx 2011   No past surgical history on file. History  Substance Use Topics  . Smoking status: Never Smoker   . Smokeless tobacco: Never Used  . Alcohol Use: Yes     Comment: weekends, 3 drinks at a time    ROS as above Medications: No current facility-administered medications for this encounter.   Current Outpatient Prescriptions  Medication Sig Dispense Refill  . amoxicillin-clavulanate (AUGMENTIN) 875-125 MG per tablet Take 1 tablet by mouth 2 (two) times daily. 20 tablet 0  . naproxen (NAPROSYN) 500 MG tablet Take 1 tablet (500 mg total) by mouth 2 (two) times daily with a meal. 30 tablet 0   No Known Allergies   Exam:  BP 104/78 mmHg  Pulse 107  Temp(Src) 99.5 F (37.5 C) (Oral)  Resp 18  SpO2 100% Gen: Well NAD HEENT: EOMI,  MMM Lungs: Normal work of breathing. CTABL Heart: RRR no MRG Abd: NABS, distended abdomen with large firm liver edge palpable about 4 cm below the ribs on the right side. Patient displays a positive Murphy test with palpation of his liver. Exts: Brisk capillary refill, warm and well perfused.   No results found for this or any previous visit (from the past 24 hour(s)). No results found.  Assessment and Plan: 53 y.o. male with right upper quadrant abdominal pain with palpable hepatomegaly and tenderness. Concerning for hepatitis versus cholecystitis. We are unable to obtain a ultrasound of his abdomen at our facility. We'll transfer to the emergency room for further evaluation and management.  Discussed warning signs or symptoms. Please see discharge instructions. Patient expresses understanding.     Gregor Hams, MD 06/19/14 1324

## 2014-06-19 NOTE — Telephone Encounter (Signed)
Pt.'s wife called stating that patient has been having severe abdominal pain for the past two days, pt's wife was advised that PCP was not in clinic and would be back until Dec. 28th, patient stated that she would take her husband to urgent care but would also like to speak to nurse.[name] Please f/u with pt.

## 2014-06-19 NOTE — ED Notes (Signed)
Reported pain to [place], [place].

## 2014-06-19 NOTE — ED Notes (Signed)
Called main lab in regards to missing lab, spoke to [place]. 10 minutes left.

## 2014-06-19 NOTE — ED Notes (Signed)
C/o epigastric pain that radiates to RUQ onset 2 days Pain increases w/pressure Denies fevers, chills, urinary sx Alert, no signs of acute distress.

## 2014-06-19 NOTE — ED Notes (Signed)
Pt sent here from ucc due to right side abd pain x 2 days. Denies fever or n/v/d. ucc noted palpable hepatomegaly and tenderness. No acute distress noted at this time.

## 2014-09-16 ENCOUNTER — Ambulatory Visit: Payer: Self-pay | Attending: Family Medicine | Admitting: Family Medicine

## 2014-09-16 ENCOUNTER — Encounter: Payer: Self-pay | Admitting: Family Medicine

## 2014-09-16 VITALS — BP 141/84 | HR 86 | Temp 98.4°F | Resp 18 | Ht 72.0 in | Wt 214.0 lb

## 2014-09-16 DIAGNOSIS — H109 Unspecified conjunctivitis: Secondary | ICD-10-CM | POA: Insufficient documentation

## 2014-09-16 DIAGNOSIS — Z114 Encounter for screening for human immunodeficiency virus [HIV]: Secondary | ICD-10-CM

## 2014-09-16 DIAGNOSIS — Z0000 Encounter for general adult medical examination without abnormal findings: Secondary | ICD-10-CM | POA: Insufficient documentation

## 2014-09-16 MED ORDER — TOBRAMYCIN 0.3 % OP SOLN: 2.0000 [drp] | OPHTHALMIC | Status: DC

## 2014-09-16 NOTE — Assessment & Plan Note (Signed)
Healthcare maintenance: you are due for screening colonoscopy, referral to GI placed. You are due for screening HIV blood draw today.

## 2014-09-16 NOTE — Patient Instructions (Addendum)
Stephen Bowers,  Thank you for coming in today.  You have conjuctiviits Use tobrex drops for 5 days sent to onsite pharmacy  Take benadryl at night for itching   Return for fever, swelling, severe eye pain, lack of improvement after 3 days of treatment.   Healthcare maintenance: you are due for screening colonoscopy, referral to GI placed. You are due for screening HIV blood draw today.   F/u in 2 weeks for RN BP check  Dr. Adrian Blackwater   Conjunctivitis Conjunctivitis is commonly called "pink eye." Conjunctivitis can be caused by bacterial or viral infection, allergies, or injuries. There is usually redness of the lining of the eye, itching, discomfort, and sometimes discharge. There may be deposits of matter along the eyelids. A viral infection usually causes a watery discharge, while a bacterial infection causes a yellowish, thick discharge. Pink eye is very contagious and spreads by direct contact. You may be given antibiotic eyedrops as part of your treatment. Before using your eye medicine, remove all drainage from the eye by washing gently with warm water and cotton balls. Continue to use the medication until you have awakened 2 mornings in a row without discharge from the eye. Do not rub your eye. This increases the irritation and helps spread infection. Use separate towels from other household members. Wash your hands with soap and water before and after touching your eyes. Use cold compresses to reduce pain and sunglasses to relieve irritation from light. Do not wear contact lenses or wear eye makeup until the infection is gone. SEEK MEDICAL CARE IF:   Your symptoms are not better after 3 days of treatment.  You have increased pain or trouble seeing.  The outer eyelids become very red or swollen. Document Released: 07/27/2004 Document Revised: 09/11/2011 Document Reviewed: 06/19/2005 [facility] Patient Information 2015 [place], [place]. This information is not intended to replace advice given  to you by your health care provider. Make sure you discuss any questions you have with your health care provider.

## 2014-09-16 NOTE — Assessment & Plan Note (Addendum)
A: viral conjunctivitis P: Treat with tobrex

## 2014-09-16 NOTE — Progress Notes (Signed)
   Subjective:    Patient ID: Stephen Bowers, male    DOB: May 09, 1961, 54 y.o.   MRN: [id number] CC: pink eye, R eye   HPI Conjunctivitis Patient presents for evaluation of discharge, erythema, itching and tearing in the right eye. He has noticed the above symptoms for 3  days.  Onset was gradual. Patient denies blurred vision, foreign body sensation, photophobia and visual field deficit. There is a history of girlfriend with pink eye.  Soc Hx: non smoker  Review of Systems As per HPI     Objective:   Physical Exam BP 141/84 mmHg  Pulse 86  Temp(Src) 98.4 F (36.9 C) (Oral)  Resp 18  Ht 6' (1.829 m)  Wt 214 lb (97.07 kg)  BMI 29.02 kg/m2  SpO2 98% General appearance: alert, cooperative and no distress Head: Normocephalic, without obvious abnormality, atraumatic Eyes: positive findings: conjunctiva: 1+ injection on R, PERRLA, EOMI  Ears: normal TM's and external ear canals both ears Throat: lips, mucosa, and tongue normal; teeth and gums normal Lungs: normal WOB       Assessment & Plan:

## 2014-09-16 NOTE — Progress Notes (Signed)
Pt complaining of rt eye burning and redness  Possible pink eye  White discharge in the morning x3 days

## 2014-09-16 NOTE — Assessment & Plan Note (Signed)
Screening HIV

## 2014-09-17 ENCOUNTER — Telehealth: Payer: Self-pay | Admitting: *Deleted

## 2014-09-17 LAB — HIV ANTIBODY (ROUTINE TESTING W REFLEX): HIV 1&2 Ab, 4th Generation: NONREACTIVE

## 2014-09-17 NOTE — Telephone Encounter (Signed)
Left voice message with male to return call 

## 2014-09-17 NOTE — Telephone Encounter (Signed)
-----   Message from Boykin Nearing, MD sent at 09/17/2014  9:19 AM EDT ----- Screening HIV negative

## 2014-10-01 ENCOUNTER — Encounter: Payer: Self-pay | Admitting: Internal Medicine

## 2014-10-05 ENCOUNTER — Ambulatory Visit: Payer: Self-pay | Attending: Family Medicine | Admitting: *Deleted

## 2014-10-05 VITALS — BP 125/82 | HR 92 | Temp 98.3°F | Resp 16

## 2014-10-05 DIAGNOSIS — Z136 Encounter for screening for cardiovascular disorders: Secondary | ICD-10-CM | POA: Insufficient documentation

## 2014-10-05 DIAGNOSIS — Z0130 Encounter for examination of blood pressure without abnormal findings: Secondary | ICD-10-CM

## 2014-10-05 NOTE — Patient Instructions (Signed)
DASH Eating Plan °DASH stands for "Dietary Approaches to Stop Hypertension." The DASH eating plan is a healthy eating plan that has been shown to reduce high blood pressure (hypertension). Additional health benefits may include reducing the risk of type 2 diabetes mellitus, heart disease, and stroke. The DASH eating plan may also help with weight loss. °WHAT DO I NEED TO KNOW ABOUT THE DASH EATING PLAN? °For the DASH eating plan, you will follow these general guidelines: °· Choose foods with a percent daily value for sodium of less than 5% (as listed on the food label). °· Use salt-free seasonings or herbs instead of table salt or sea salt. °· Check with your health care provider or pharmacist before using salt substitutes. °· Eat lower-sodium products, often labeled as "lower sodium" or "no salt added." °· Eat fresh foods. °· Eat more vegetables, fruits, and low-fat dairy products. °· Choose whole grains. Look for the word "whole" as the first word in the ingredient list. °· Choose fish and skinless chicken or turkey more often than red meat. Limit fish, poultry, and meat to 6 oz (170 g) each day. °· Limit sweets, desserts, sugars, and sugary drinks. °· Choose heart-healthy fats. °· Limit cheese to 1 oz (28 g) per day. °· Eat more home-cooked food and less restaurant, buffet, and fast food. °· Limit fried foods. °· Cook foods using methods other than frying. °· Limit canned vegetables. If you do use them, rinse them well to decrease the sodium. °· When eating at a restaurant, ask that your food be prepared with less salt, or no salt if possible. °WHAT FOODS CAN I EAT? °Seek help from a dietitian for individual calorie needs. °Grains °Whole grain or whole wheat bread. Brown rice. Whole grain or whole wheat pasta. Quinoa, bulgur, and whole grain cereals. Low-sodium cereals. Corn or whole wheat flour tortillas. Whole grain cornbread. Whole grain crackers. Low-sodium crackers. °Vegetables °Fresh or frozen vegetables  (raw, steamed, roasted, or grilled). Low-sodium or reduced-sodium tomato and vegetable juices. Low-sodium or reduced-sodium tomato sauce and paste. Low-sodium or reduced-sodium canned vegetables.  °Fruits °All fresh, canned (in natural juice), or frozen fruits. °Meat and Other Protein Products °Ground beef (85% or leaner), grass-fed beef, or beef trimmed of fat. Skinless chicken or turkey. Ground chicken or turkey. Pork trimmed of fat. All fish and seafood. Eggs. Dried beans, peas, or lentils. Unsalted nuts and seeds. Unsalted canned beans. °Dairy °Low-fat dairy products, such as skim or 1% milk, 2% or reduced-fat cheeses, low-fat ricotta or cottage cheese, or plain low-fat yogurt. Low-sodium or reduced-sodium cheeses. °Fats and Oils °Tub margarines without trans fats. Light or reduced-fat mayonnaise and salad dressings (reduced sodium). Avocado. Safflower, olive, or canola oils. Natural peanut or almond butter. °Other °Unsalted popcorn and pretzels. °The items listed above may not be a complete list of recommended foods or beverages. Contact your dietitian for more options. °WHAT FOODS ARE NOT RECOMMENDED? °Grains °White bread. White pasta. White rice. Refined cornbread. Bagels and croissants. Crackers that contain trans fat. °Vegetables °Creamed or fried vegetables. Vegetables in a cheese sauce. Regular canned vegetables. Regular canned tomato sauce and paste. Regular tomato and vegetable juices. °Fruits °Dried fruits. Canned fruit in light or heavy syrup. Fruit juice. °Meat and Other Protein Products °Fatty cuts of meat. Ribs, chicken wings, bacon, sausage, bologna, salami, chitterlings, fatback, hot dogs, bratwurst, and packaged luncheon meats. Salted nuts and seeds. Canned beans with salt. °Dairy °Whole or 2% milk, cream, half-and-half, and cream cheese. Whole-fat or sweetened yogurt. Full-fat   cheeses or blue cheese. Nondairy creamers and whipped toppings. Processed cheese, cheese spreads, or cheese  curds. °Condiments °Onion and garlic salt, seasoned salt, table salt, and sea salt. Canned and packaged gravies. Worcestershire sauce. Tartar sauce. Barbecue sauce. Teriyaki sauce. Soy sauce, including reduced sodium. Steak sauce. Fish sauce. Oyster sauce. Cocktail sauce. Horseradish. Ketchup and mustard. Meat flavorings and tenderizers. Bouillon cubes. Hot sauce. Tabasco sauce. Marinades. Taco seasonings. Relishes. °Fats and Oils °Butter, stick margarine, lard, shortening, ghee, and bacon fat. Coconut, palm kernel, or palm oils. Regular salad dressings. °Other °Pickles and olives. Salted popcorn and pretzels. °The items listed above may not be a complete list of foods and beverages to avoid. Contact your dietitian for more information. °WHERE CAN I FIND MORE INFORMATION? °National Heart, Lung, and [employer] Institute: www.nhlbi.nih.gov/health/health-topics/topics/dash/ °Document Released: 06/08/2011 Document Revised: 11/03/2013 Document Reviewed: 04/23/2013 °Exit[employer]® Patient Information ©2015 [employer]. This information is not intended to replace advice given to you by your health care provider. Make sure you discuss any questions you have with your health care provider. ° °

## 2014-10-05 NOTE — Progress Notes (Signed)
Patient presents for BP check Med list reviewed; states taking no meds at present Discussed need for low sodium diet and using Mrs. Dash as alternative to salt Encouraged to choose foods with 5% or less of daily value for sodium. Discussed walking 30 minutes per day for exercise Patient denies headaches, blurred vision, chest pain or pressure C/o occassional SHOB; patient states he thinks due to being overweight and he is attempting to lose weight.  BP 125/82 P 98.3 R  16 T 98.3 oral SPO2  96%  Patient aware that he is to f/u with PCP as needed  Patient given literature on DASH Eating Plan

## 2014-11-26 ENCOUNTER — Ambulatory Visit (AMBULATORY_SURGERY_CENTER): Payer: Self-pay | Admitting: *Deleted

## 2014-11-26 VITALS — Ht 72.0 in | Wt 219.0 lb

## 2014-11-26 DIAGNOSIS — Z1211 Encounter for screening for malignant neoplasm of colon: Secondary | ICD-10-CM

## 2014-11-26 MED ORDER — NA SULFATE-K SULFATE-MG SULF 17.5-3.13-1.6 GM/177ML PO SOLN: 1.0000 | Freq: Once | ORAL | Status: DC

## 2014-11-26 NOTE — Progress Notes (Signed)
No egg or soy allergy. No anesthesia problems.  No home O2.  No diet meds.  

## 2014-12-02 HISTORY — PX: COLONOSCOPY: SHX174

## 2014-12-10 ENCOUNTER — Encounter: Payer: Self-pay | Admitting: Internal Medicine

## 2014-12-10 ENCOUNTER — Ambulatory Visit (AMBULATORY_SURGERY_CENTER): Payer: Self-pay | Admitting: Internal Medicine

## 2014-12-10 VITALS — BP 140/102 | HR 79 | Temp 97.6°F | Resp 17 | Ht 72.0 in | Wt 219.0 lb

## 2014-12-10 DIAGNOSIS — K635 Polyp of colon: Secondary | ICD-10-CM

## 2014-12-10 DIAGNOSIS — Z1211 Encounter for screening for malignant neoplasm of colon: Secondary | ICD-10-CM

## 2014-12-10 DIAGNOSIS — D120 Benign neoplasm of cecum: Secondary | ICD-10-CM

## 2014-12-10 DIAGNOSIS — D123 Benign neoplasm of transverse colon: Secondary | ICD-10-CM

## 2014-12-10 DIAGNOSIS — D122 Benign neoplasm of ascending colon: Secondary | ICD-10-CM

## 2014-12-10 DIAGNOSIS — D125 Benign neoplasm of sigmoid colon: Secondary | ICD-10-CM

## 2014-12-10 MED ORDER — SODIUM CHLORIDE 0.9 % IV SOLN: 500.0000 mL | INTRAVENOUS | Status: DC

## 2014-12-10 NOTE — Progress Notes (Signed)
Called to room to assist during endoscopic procedure.  Patient ID and intended procedure confirmed with present staff. Received instructions for my participation in the procedure from the performing physician.  

## 2014-12-10 NOTE — Op Note (Signed)
[facility]  [address] [place], [id number]   COLONOSCOPY PROCEDURE REPORT  PATIENT: Stephen, Bowers  MR#: [id number] BIRTHDATE: 1961-05-06 , 80  yrs. old GENDER: male ENDOSCOPIST: Jerene Bears, MD REFERRED QQ:[id number] Funches, MD PROCEDURE DATE:  12/10/2014 PROCEDURE:   Colonoscopy, screening, Colonoscopy with snare polypectomy, and Colonoscopy with cold biopsy polypectomy First Screening Colonoscopy - Avg.  risk and is 50 yrs.  old or older Yes.  Prior Negative Screening - Now for repeat screening. N/A  History of Adenoma - Now for follow-up colonoscopy & has been > or = to 3 yrs.  N/A  Polyps removed today? Yes ASA CLASS:   Class II INDICATIONS:Screening for colonic neoplasia and Colorectal Neoplasm Risk Assessment for this procedure is average risk. MEDICATIONS: Monitored anesthesia care and Propofol 400 mg IV  DESCRIPTION OF PROCEDURE:   After the risks benefits and alternatives of the procedure were thoroughly explained, informed consent was obtained.  The digital rectal exam revealed no rectal mass.   The LB Olympus Loaner [id number]  endoscope was introduced through the anus and advanced to the cecum, which was identified by both the appendix and ileocecal valve. No adverse events experienced.   The quality of the prep was good.  (Suprep was used) The instrument was then slowly withdrawn as the colon was fully examined. Estimated blood loss is zero unless otherwise noted in this procedure report.   COLON FINDINGS: A sessile polyp measuring 12 mm in size was found at the ileocecal valve.  A polypectomy was performed using snare cautery.  The resection was complete, the polyp tissue was completely retrieved and sent to histology.   Three sessile polyps ranging between 3-26mm in size were found at the cecum (1) and in the ascending colon (2).  Polypectomies were performed with cold forceps (1) and with a cold snare (2).  The resection was complete, the  polyp tissue was completely retrieved and sent to histology. Three sessile polyps ranging from 4 to 80mm in size were found in the sigmoid colon.  Polypectomies were performed with a cold snare. The resection was complete, the polyp tissue was completely retrieved and sent to histology.  Retroflexed views revealed no abnormalities. The time to cecum = 2.4 Withdrawal time = 17.8   The scope was withdrawn and the procedure completed.  COMPLICATIONS: There were no immediate complications.   ENDOSCOPIC IMPRESSION: 1.   Sessile polyp was found at the ileocecal valve; polypectomy was performed using snare cautery 2.   Three sessile polyps ranging between 3-61mm in size were found at the cecum and in the ascending colon; polypectomies were performed with cold forceps and with a cold snare 3.   Three sessile polyps ranging from 4 to 31mm in size were found in the sigmoid colon; polypectomies were performed with a cold snare  RECOMMENDATIONS: 1.  Hold Aspirin and all other NSAIDs for 2 weeks. 2.  Await pathology results 3.  Timing of repeat colonoscopy will be determined by pathology findings. 4.  You will receive a letter within 1-2 weeks with the results of your biopsy as well as final recommendations.  Please call my office if you have not received a letter after 3 weeks.  eSigned:  Jerene Bears, MD 12/10/2014 11:27 AM   cc: Boykin Nearing, MD and The Patient   PATIENT NAME:  Stephen, Bowers MR#: [id number]

## 2014-12-10 NOTE — Progress Notes (Signed)
Report to PACU, RN, vss, BBS= Clear.  

## 2014-12-10 NOTE — Progress Notes (Signed)
Josh Monday, CRNA was notified of pt's elevated blood pressure 161/119 rt, 150/110 left arm.  Will proceed with colonoscopy per CRNA. maw

## 2014-12-10 NOTE — Patient Instructions (Signed)
YOU HAD AN ENDOSCOPIC PROCEDURE TODAY AT [facility]:   Refer to the procedure report that was given to you for any specific questions about what was found during the examination.  If the procedure report does not answer your questions, please call your gastroenterologist to clarify.  If you requested that your care partner not be given the details of your procedure findings, then the procedure report has been included in a sealed envelope for you to review at your convenience later.  YOU SHOULD EXPECT: Some feelings of bloating in the abdomen. Passage of more gas than usual.  Walking can help get rid of the air that was put into your GI tract during the procedure and reduce the bloating. If you had a lower endoscopy (such as a colonoscopy or flexible sigmoidoscopy) you may notice spotting of blood in your stool or on the toilet paper. If you underwent a bowel prep for your procedure, you may not have a normal bowel movement for a few days.  Please Note:  You might notice some irritation and congestion in your nose or some drainage.  This is from the oxygen used during your procedure.  There is no need for concern and it should clear up in a day or so.  SYMPTOMS TO REPORT IMMEDIATELY:   Following lower endoscopy (colonoscopy or flexible sigmoidoscopy):  Excessive amounts of blood in the stool  Significant tenderness or worsening of abdominal pains  Swelling of the abdomen that is new, acute  Fever of 100F or higher  For urgent or emergent issues, a gastroenterologist can be reached at any hour by calling [phone].  DIET: Your first meal following the procedure should be a small meal and then it is ok to progress to your normal diet. Heavy or fried foods are harder to digest and may make you feel nauseous or bloated.  Likewise, meals heavy in dairy and vegetables can increase bloating.  Drink plenty of fluids but you should avoid alcoholic beverages for 24 hours.  ACTIVITY:   You should plan to take it easy for the rest of today and you should NOT DRIVE or use heavy machinery until tomorrow (because of the sedation medicines used during the test).    FOLLOW UP: Our staff will call the number listed on your records the next business day following your procedure to check on you and address any questions or concerns that you may have regarding the information given to you following your procedure. If we do not reach you, we will leave a message.  However, if you are feeling well and you are not experiencing any problems, there is no need to return our call.  We will assume that you have returned to your regular daily activities without incident.  If any biopsies were taken you will be contacted by phone or by letter within the next 1-3 weeks.  Please call us at [phone] if you have not heard about the biopsies in 3 weeks.    SIGNATURES/CONFIDENTIALITY: You and/or your care partner have signed paperwork which will be entered into your electronic medical record.  These signatures attest to the fact that that the information above on your After Visit Summary has been reviewed and is understood.  Full responsibility of the confidentiality of this discharge information lies with you and/or your care-partner.  No aspirin, aspirin containing products (BC or Goody powders), or NSAIDs (Ibuprofen, Advil, Aleve, Motrin) for 2 weeks- Tylenol is ok  Please read over handouts  about polyps  Continue your normal medications

## 2014-12-11 ENCOUNTER — Telehealth: Payer: Self-pay | Admitting: *Deleted

## 2014-12-11 NOTE — Telephone Encounter (Signed)
  Follow up Call-  Call back number 12/10/2014  Post procedure Call Back phone  # [phone] cell  Permission to leave phone message Yes     Patient questions:  Do you have a fever, pain , or abdominal swelling? No. Pain Score  0 *  Have you tolerated food without any problems? Yes.    Have you been able to return to your normal activities? Yes.    Do you have any questions about your discharge instructions: Diet   No. Medications  No. Follow up visit  No.  Do you have questions or concerns about your Care? No.  Actions: * If pain score is 4 or above: No action needed, pain <4.

## 2014-12-17 ENCOUNTER — Encounter: Payer: Self-pay | Admitting: Internal Medicine

## 2015-01-18 ENCOUNTER — Ambulatory Visit: Payer: Self-pay | Attending: Family Medicine | Admitting: Family Medicine

## 2015-01-18 ENCOUNTER — Encounter: Payer: Self-pay | Admitting: Family Medicine

## 2015-01-18 VITALS — BP 130/86 | HR 91 | Temp 98.5°F | Resp 16 | Ht 72.0 in | Wt 210.0 lb

## 2015-01-18 DIAGNOSIS — M545 Low back pain, unspecified: Secondary | ICD-10-CM

## 2015-01-18 DIAGNOSIS — E559 Vitamin D deficiency, unspecified: Secondary | ICD-10-CM

## 2015-01-18 DIAGNOSIS — Z87891 Personal history of nicotine dependence: Secondary | ICD-10-CM | POA: Insufficient documentation

## 2015-01-18 DIAGNOSIS — R208 Other disturbances of skin sensation: Secondary | ICD-10-CM

## 2015-01-18 DIAGNOSIS — R200 Anesthesia of skin: Secondary | ICD-10-CM | POA: Insufficient documentation

## 2015-01-18 LAB — POCT GLYCOSYLATED HEMOGLOBIN (HGB A1C): Hemoglobin A1C: 6

## 2015-01-18 MED ORDER — NAPROXEN 500 MG PO TABS: 500.0000 mg | ORAL_TABLET | Freq: Two times a day (BID) | ORAL | Status: DC | PRN

## 2015-01-18 NOTE — Progress Notes (Signed)
Complaining of numbness on legs and feet  Stated low energy

## 2015-01-18 NOTE — Patient Instructions (Signed)
Stephen Bowers,  Thank you for coming in today  1. Numbness in feet and legs, R worse than L Labs Lab Results  Component Value Date   HGBA1C 6.0 01/18/2015  this is normal but higher than last check, continue weight loss, low carb diet.   B12, vit D Refilled naproxen If labs normal plan for neurology referral for nerve conduction test  Avoid the following position:  Prolonged lying, leg crossing, squatting.  F/u in 6 weeks for leg numbness   For your diet:  1. Make sure to eat breakfast, lunch and dinner (also may add one snack mid morning or mid afternoon).  2. Carbs: no more than 2 servings (30 gram/2oz) per meal and 1 serving per snack.  3. Exercise such that you sweat some and your heart rate goes up most days of the week.  4. Water, water, water  5. Check blood sugar 2-3 x per week to get a feel for how different foods effect your blood sugar:  Goal fasting 70-130  Goal after eating < 200 6. Beware of hypoglycemia which is blood sugar < 70 with or without symptoms  Dr. Adrian Blackwater

## 2015-01-18 NOTE — Progress Notes (Signed)
   Subjective:    Patient ID: Stephen Bowers, male    DOB: 10-06-1960, 54 y.o.   MRN: [id number] CC: foot and leg numbness HPI 54 yo M presents for f/u visit:  1. Leg numbness: R >L. X 18 months. Naproxen helped. Weight loss had not helped. R foot feels very numb and painful in AM. Numbness is R foot up to just below R knee. L foot up to distal shin. No trauma. No weakness. Mild to moderate low back pain. Patient is walking for exercise.   Soc Hx: former smoker, quit, drink 3 12 oz beers on Saturday's while fishing   Review of Systems  Constitutional: Negative for fever, chills, fatigue and unexpected weight change.  Eyes: Negative for visual disturbance.  Respiratory: Negative for shortness of breath.   Cardiovascular: Negative for chest pain, palpitations and leg swelling.  Musculoskeletal: Positive for back pain. Negative for myalgias, arthralgias, gait problem and neck pain.  Skin: Negative for rash.  Neurological: Positive for numbness. Negative for tremors and weakness.      Objective:   Physical Exam BP 130/86 mmHg  Pulse 91  Temp(Src) 98.5 F (36.9 C) (Oral)  Resp 16  Ht 6' (1.829 m)  Wt 210 lb (95.255 kg)  BMI 28.47 kg/m2  SpO2 96%  Wt Readings from Last 3 Encounters:  01/18/15 210 lb (95.255 kg)  12/10/14 219 lb (99.338 kg)  11/26/14 219 lb (99.338 kg)  General appearance: alert, cooperative and no distress Lungs: clear to auscultation bilaterally Heart: regular rate and rhythm, S1, S2 normal, no murmur, click, rub or gallop Extremities: extremities normal, atraumatic, no cyanosis or edema thickened on long toenails of both feet  Back; Negative straight leg  Neuro: Pulse: 2+ DP pulses b/l       Assessment & Plan:

## 2015-01-18 NOTE — Assessment & Plan Note (Signed)
Numbness in feet and legs, R worse than L Labs Lab Results  Component Value Date   HGBA1C 6.0 01/18/2015  this is normal but higher than last check, continue weight loss, low carb diet.   B12, vit D Refilled naproxen If labs normal plan for neurology referral for nerve conduction test  Avoid the following position:  Prolonged lying, leg crossing, squatting.

## 2015-01-19 ENCOUNTER — Telehealth: Payer: Self-pay | Admitting: *Deleted

## 2015-01-19 DIAGNOSIS — E559 Vitamin D deficiency, unspecified: Secondary | ICD-10-CM | POA: Insufficient documentation

## 2015-01-19 LAB — VITAMIN D 25 HYDROXY (VIT D DEFICIENCY, FRACTURES): VIT D 25 HYDROXY: 6 ng/mL — AB (ref 30–100)

## 2015-01-19 LAB — VITAMIN B12: VITAMIN B 12: 515 pg/mL (ref 211–911)

## 2015-01-19 MED ORDER — VITAMIN D (ERGOCALCIFEROL) 1.25 MG (50000 UNIT) PO CAPS: 50000.0000 [IU] | ORAL_CAPSULE | ORAL | Status: DC

## 2015-01-19 NOTE — Assessment & Plan Note (Signed)
A: vit D def severe P:  Vit D ordered x 12 weeks

## 2015-01-19 NOTE — Telephone Encounter (Signed)
-----   Message from Boykin Nearing, MD sent at 01/19/2015 10:16 AM EDT ----- Vit D def, severe, vit D ordered 50 K U weekly x 12 weeks Normal B12

## 2015-01-19 NOTE — Telephone Encounter (Signed)
Pt aware of results 

## 2015-01-19 NOTE — Addendum Note (Signed)
Addended by: Boykin Nearing on: 01/19/2015 10:17 AM   Modules accepted: Orders

## 2015-03-02 ENCOUNTER — Encounter: Payer: Self-pay | Admitting: Family Medicine

## 2015-03-02 ENCOUNTER — Ambulatory Visit: Payer: Self-pay | Attending: Family Medicine | Admitting: Family Medicine

## 2015-03-02 VITALS — BP 155/94 | HR 84 | Temp 98.5°F | Resp 16 | Ht 72.0 in | Wt 211.0 lb

## 2015-03-02 DIAGNOSIS — Z87891 Personal history of nicotine dependence: Secondary | ICD-10-CM | POA: Insufficient documentation

## 2015-03-02 DIAGNOSIS — R208 Other disturbances of skin sensation: Secondary | ICD-10-CM

## 2015-03-02 DIAGNOSIS — R030 Elevated blood-pressure reading, without diagnosis of hypertension: Secondary | ICD-10-CM | POA: Insufficient documentation

## 2015-03-02 DIAGNOSIS — I10 Essential (primary) hypertension: Secondary | ICD-10-CM | POA: Insufficient documentation

## 2015-03-02 DIAGNOSIS — E559 Vitamin D deficiency, unspecified: Secondary | ICD-10-CM | POA: Insufficient documentation

## 2015-03-02 DIAGNOSIS — R200 Anesthesia of skin: Secondary | ICD-10-CM | POA: Insufficient documentation

## 2015-03-02 DIAGNOSIS — IMO0001 Reserved for inherently not codable concepts without codable children: Secondary | ICD-10-CM

## 2015-03-02 NOTE — Patient Instructions (Addendum)
Mr. Maggart,  1. Leg numbness: improving with vit D replacement Continue vitamin D for 12 total weeks Will recheck in 4 weeks after course   2. Elevated BP: Low salt diet Regular exercise for weight loss and stress reduction  F/u in September for flu shot and BP check  F/u with me in 3 months for vit D check   Dr. Adrian Blackwater

## 2015-03-02 NOTE — Progress Notes (Signed)
   Subjective:    Patient ID: Stephen Bowers, male    DOB: 10/15/1960, 54 y.o.   MRN: [id number] CC: f.u leg numbness  HPI  54 yo M with leg numbness  1. Numbness: improved. Still with slight numbness on plantar aspect of R foot. Patient found to have vitamin D deficiency and taking vitamin D. His energy is much better. He denies muscles and bone aches.   2. Elevated BP: patient w/o HA, CP, SOB. Under increased stress as his wife will have knee replacement surgery today. He is exercising. No smoking.   Social History  Substance Use Topics  . Smoking status: Former Smoker    Types: Cigarettes  . Smokeless tobacco: Never Used  . Alcohol Use: 2.4 oz/week    0 Standard drinks or equivalent, 4 Cans of beer per week     Comment: weekends, 4/week   Review of Systems  Constitutional: Negative for fever, chills, fatigue and unexpected weight change.  Eyes: Negative for visual disturbance.  Respiratory: Negative for cough and shortness of breath.   Cardiovascular: Negative for chest pain, palpitations and leg swelling.  Gastrointestinal: Negative for nausea, vomiting, abdominal pain, diarrhea, constipation and blood in stool.  Endocrine: Negative for polydipsia, polyphagia and polyuria.  Musculoskeletal: Negative for myalgias, back pain, arthralgias, gait problem and neck pain.  Skin: Negative for rash.  Allergic/Immunologic: Negative for immunocompromised state.  Neurological: Positive for numbness (plantar surface of R foot ).  Hematological: Negative for adenopathy. Does not bruise/bleed easily.  Psychiatric/Behavioral: Negative for suicidal ideas, sleep disturbance and dysphoric mood. The patient is not nervous/anxious.       Objective:   Physical Exam  Constitutional: He appears well-developed and well-nourished. No distress.  HENT:  Head: Normocephalic and atraumatic.  Cardiovascular: Intact distal pulses.   Pulmonary/Chest: Effort normal.  Musculoskeletal: He exhibits no  edema or tenderness.  Neurological: He is alert.  Skin: Skin is warm and dry. No rash noted. No erythema.  Psychiatric: He has a normal mood and affect.  BP 155/94 mmHg  Pulse 84  Temp(Src) 98.5 F (36.9 C) (Oral)  Resp 16  Ht 6' (1.829 m)  Wt 211 lb (95.709 kg)  BMI 28.61 kg/m2  SpO2 98% BP Readings from Last 3 Encounters:  03/02/15 150/99  01/18/15 130/86  12/10/14 140/102   Wt Readings from Last 3 Encounters:  03/02/15 211 lb (95.709 kg)  01/18/15 210 lb (95.255 kg)  12/10/14 219 lb (99.338 kg)          Assessment & Plan:

## 2015-03-02 NOTE — Assessment & Plan Note (Signed)
  Elevated BP: Low salt diet Regular exercise for weight loss and stress reduction  F/u in September for flu shot and BP check

## 2015-03-02 NOTE — Assessment & Plan Note (Signed)
Patient with deficiency, taking treatment

## 2015-03-02 NOTE — Assessment & Plan Note (Signed)
Leg numbness: improving with vit D replacement Continue vitamin D for 12 total weeks Will recheck in 4 weeks after course

## 2015-03-02 NOTE — Progress Notes (Signed)
F/U numbness on legs Stated better now  Medication refill

## 2015-03-23 ENCOUNTER — Encounter: Payer: Self-pay | Admitting: Pharmacist

## 2015-03-23 ENCOUNTER — Ambulatory Visit: Payer: Self-pay | Attending: Family Medicine | Admitting: Pharmacist

## 2015-03-23 VITALS — BP 123/81 | HR 90

## 2015-03-23 DIAGNOSIS — Z23 Encounter for immunization: Secondary | ICD-10-CM | POA: Insufficient documentation

## 2015-03-23 DIAGNOSIS — IMO0001 Reserved for inherently not codable concepts without codable children: Secondary | ICD-10-CM

## 2015-03-23 DIAGNOSIS — R030 Elevated blood-pressure reading, without diagnosis of hypertension: Secondary | ICD-10-CM | POA: Insufficient documentation

## 2015-03-23 MED ORDER — INFLUENZA VAC SPLIT QUAD 0.5 ML IM SUSY
0.5000 mL | PREFILLED_SYRINGE | Freq: Once | INTRAMUSCULAR | Status: AC
  Administered 2015-03-23: 0.5 mL via INTRAMUSCULAR

## 2015-03-23 NOTE — Patient Instructions (Addendum)

## 2015-03-23 NOTE — Patient Instructions (Signed)
Influenza Virus Vaccine injection What is this medicine? INFLUENZA VIRUS VACCINE (in floo EN zuh VAHY ruhs vak SEEN) helps to reduce the risk of getting influenza also known as the flu. The vaccine only helps protect you against some strains of the flu. This medicine may be used for other purposes; ask your health care Livier Hendel or pharmacist if you have questions. COMMON BRAND NAME(S): Afluria, Agriflu, Fluarix, Fluarix Quadrivalent, FLUCELVAX, Flulaval, Fluvirin, Fluzone, Fluzone High-Dose, Fluzone Intradermal What should I tell my health care Tamico Mundo before I take this medicine? They need to know if you have any of these conditions: -bleeding disorder like hemophilia -fever or infection -Guillain-Barre syndrome or other neurological problems -immune system problems -infection with the human immunodeficiency virus (HIV) or AIDS -low blood platelet counts -multiple sclerosis -an unusual or allergic reaction to influenza virus vaccine, latex, other medicines, foods, dyes, or preservatives. Different brands of vaccines contain different allergens. Some may contain latex or eggs. Talk to your doctor about your allergies to make sure that you get the right vaccine. -pregnant or trying to get pregnant -breast-feeding How should I use this medicine? This vaccine is for injection into a muscle or under the skin. It is given by a health care professional. A copy of Vaccine Information Statements will be given before each vaccination. Read this sheet carefully each time. The sheet may change frequently. Talk to your healthcare Adalaide Jaskolski to see which vaccines are right for you. Some vaccines should not be used in all age groups. Overdosage: If you think you have taken too much of this medicine contact a poison control center or emergency room at once. NOTE: This medicine is only for you. Do not share this medicine with others. What if I miss a dose? This does not apply. What may interact with this  medicine? -chemotherapy or radiation therapy -medicines that lower your immune system like etanercept, anakinra, infliximab, and adalimumab -medicines that treat or prevent blood clots like warfarin -phenytoin -steroid medicines like prednisone or cortisone -theophylline -vaccines This list may not describe all possible interactions. Give your health care Bethany Cumming a list of all the medicines, herbs, non-prescription drugs, or dietary supplements you use. Also tell them if you smoke, drink alcohol, or use illegal drugs. Some items may interact with your medicine. What should I watch for while using this medicine? Report any side effects that do not go away within 3 days to your doctor or health care professional. Call your health care Siham Bucaro if any unusual symptoms occur within 6 weeks of receiving this vaccine. You may still catch the flu, but the illness is not usually as bad. You cannot get the flu from the vaccine. The vaccine will not protect against colds or other illnesses that may cause fever. The vaccine is needed every year. What side effects may I notice from receiving this medicine? Side effects that you should report to your doctor or health care professional as soon as possible: -allergic reactions like skin rash, itching or hives, swelling of the face, lips, or tongue Side effects that usually do not require medical attention (report to your doctor or health care professional if they continue or are bothersome): -fever -headache -muscle aches and pains -pain, tenderness, redness, or swelling at the injection site -tiredness This list may not describe all possible side effects. Call your doctor for medical advice about side effects. You may report side effects to FDA at 1-800-FDA-1088. Where should I keep my medicine? The vaccine will be given by a health   care professional in a clinic, pharmacy, doctor's office, or other health care setting. You will not be given vaccine doses  to store at home. NOTE: This sheet is a summary. It may not cover all possible information. If you have questions about this medicine, talk to your doctor, pharmacist, or health care [employer] Carneiro.  2015, Elsevier/Gold Standard. (2011-12-28 13:08:28)  

## 2015-03-23 NOTE — Progress Notes (Signed)
S:    Patient arrives in good spirits for blood pressure evaluation.   O:   Last 3 Office BP readings: BP Readings from Last 3 Encounters:  03/23/15 123/81  03/02/15 155/94  01/18/15 130/86    BMET    Component Value Date/Time   NA 129* 06/19/2014 2121   K 4.8 06/19/2014 2121   CL 102 06/19/2014 2121   CO2 21 08/13/2013 0800   GLUCOSE 109* 06/19/2014 2121   BUN 18 06/19/2014 2121   CREATININE 1.10 06/19/2014 2121   CALCIUM 8.1* 08/13/2013 0800   GFRNONAA >90 08/13/2013 0800   GFRAA >90 08/13/2013 0800    A/P:  History of elevated blood pressure, currently controlled on no medications. No recommendations for any changes at this time except to continue lifestyle modifications. Results reviewed and written information provided. F/U Clinic Visit with Dr. Adrian Blackwater as directed.  Total time in face-to-face counseling 10 minutes.  Patient seen with Stephens November, PharmD Resident.

## 2015-08-02 ENCOUNTER — Other Ambulatory Visit: Payer: Self-pay | Admitting: Family Medicine

## 2015-08-02 ENCOUNTER — Encounter: Payer: Self-pay | Admitting: Family Medicine

## 2015-08-02 ENCOUNTER — Ambulatory Visit: Payer: Self-pay | Attending: Family Medicine | Admitting: Family Medicine

## 2015-08-02 VITALS — BP 144/84 | HR 86 | Temp 98.0°F | Resp 16 | Ht 72.0 in | Wt 205.0 lb

## 2015-08-02 DIAGNOSIS — I10 Essential (primary) hypertension: Secondary | ICD-10-CM | POA: Insufficient documentation

## 2015-08-02 DIAGNOSIS — Z0000 Encounter for general adult medical examination without abnormal findings: Secondary | ICD-10-CM | POA: Insufficient documentation

## 2015-08-02 DIAGNOSIS — B351 Tinea unguium: Secondary | ICD-10-CM | POA: Insufficient documentation

## 2015-08-02 DIAGNOSIS — H1013 Acute atopic conjunctivitis, bilateral: Secondary | ICD-10-CM

## 2015-08-02 DIAGNOSIS — B353 Tinea pedis: Secondary | ICD-10-CM | POA: Insufficient documentation

## 2015-08-02 DIAGNOSIS — Z79899 Other long term (current) drug therapy: Secondary | ICD-10-CM | POA: Insufficient documentation

## 2015-08-02 DIAGNOSIS — E559 Vitamin D deficiency, unspecified: Secondary | ICD-10-CM

## 2015-08-02 DIAGNOSIS — Z1159 Encounter for screening for other viral diseases: Secondary | ICD-10-CM

## 2015-08-02 LAB — POCT URINALYSIS DIPSTICK
BILIRUBIN UA: NEGATIVE
Blood, UA: NEGATIVE
Glucose, UA: NEGATIVE
Ketones, UA: NEGATIVE
LEUKOCYTES UA: NEGATIVE
NITRITE UA: NEGATIVE
Protein, UA: NEGATIVE
Spec Grav, UA: 1.01
Urobilinogen, UA: 0.2
pH, UA: 5.5

## 2015-08-02 LAB — COMPLETE METABOLIC PANEL WITH GFR
ALT: 21 U/L (ref 9–46)
AST: 29 U/L (ref 10–35)
Albumin: 4.3 g/dL (ref 3.6–5.1)
Alkaline Phosphatase: 40 U/L (ref 40–115)
BUN: 8 mg/dL (ref 7–25)
CALCIUM: 9.5 mg/dL (ref 8.6–10.3)
CHLORIDE: 102 mmol/L (ref 98–110)
CO2: 25 mmol/L (ref 20–31)
CREATININE: 0.85 mg/dL (ref 0.70–1.33)
GFR, Est African American: >89 mL/min (ref >=60)
GFR, Est Non African American: >89 mL/min (ref >=60)
Glucose, Bld: 106 mg/dL — ABNORMAL HIGH (ref 65–99)
Potassium: 4.9 mmol/L (ref 3.5–5.3)
Sodium: 139 mmol/L (ref 135–146)
TOTAL PROTEIN: 8 g/dL (ref 6.1–8.1)
Total Bilirubin: 0.2 mg/dL (ref 0.2–1.2)

## 2015-08-02 LAB — HEMOCCULT GUIAC POC 1CARD (OFFICE): Fecal Occult Blood, POC: NEGATIVE

## 2015-08-02 LAB — HEPATITIS C ANTIBODY: HCV AB: NEGATIVE

## 2015-08-02 MED ORDER — KETOTIFEN FUMARATE 0.025 % OP SOLN: 1.0000 [drp] | Freq: Two times a day (BID) | OPHTHALMIC | Status: DC

## 2015-08-02 MED ORDER — TERBINAFINE HCL 1 % EX CREA: 1.0000 "application " | TOPICAL_CREAM | Freq: Two times a day (BID) | CUTANEOUS | Status: DC

## 2015-08-02 MED ORDER — LOSARTAN POTASSIUM 25 MG PO TABS: 25.0000 mg | ORAL_TABLET | Freq: Every day | ORAL | Status: DC

## 2015-08-02 MED FILL — LOSARTAN POTASSIUM 25 MG TA: 25 | 30 days supply | Qty: 30 | Fill #0

## 2015-08-02 NOTE — Assessment & Plan Note (Signed)
A: HTN off norvasc despite weight loss P: Losartan 25 mg daily Continue low salt and weight loss

## 2015-08-02 NOTE — Progress Notes (Signed)
Patient ID: Stephen Bowers, male   DOB: 05-26-1961, 55 y.o.   MRN: [id number] SUBJECTIVE:  Stephen Bowers is a 55 y.o. male presenting for his annual checkup.   Current Outpatient Prescriptions  Medication Sig Dispense Refill  . B Complex-C (SUPER B COMPLEX PO) Take 1 tablet by mouth.    . naproxen (NAPROSYN) 500 MG tablet Take 1 tablet (500 mg total) by mouth 2 (two) times daily as needed for moderate pain (take with food). (Patient not taking: Reported on 08/02/2015) 60 tablet 3  . Vitamin D, Ergocalciferol, (DRISDOL) 50000 UNITS CAPS capsule Take 1 capsule (50,000 Units total) by mouth every 7 (seven) days. For 12 weeks (Patient not taking: Reported on 08/02/2015) 12 capsule 0   No current facility-administered medications for this visit.   Allergies: Review of patient's allergies indicates no known allergies.   ROS:  Feeling well. No dyspnea or chest pain on exertion. No abdominal pain, change in bowel habits, black or bloody stools. No urinary tract or prostatic symptoms. No neurological complaints. MSK back pain. DERM dry skin on feet and thickened and discolored toenails.   OBJECTIVE:  The patient appears well, alert, oriented x 3, in no distress.  BP 144/84 mmHg  Pulse 86  Temp(Src) 98 F (36.7 C) (Oral)  Resp 16  Ht 6' (1.829 m)  Wt 205 lb (92.987 kg)  BMI 27.80 kg/m2  SpO2 98% ENT normal.  Neck supple. No adenopathy or thyromegaly. PERLA. Lungs are clear, good air entry, no wheezes, rhonchi or rales. S1 and S2 normal, no murmurs, regular rate and rhythm. Abdomen is soft without tenderness, guarding, mass or organomegaly. GU exam: no penile lesions or discharge, no testicular masses or tenderness, no hernias, rectum normal, no masses, prostate normal size, symmetric, no nodules or tenderness, guaiac negative brown stool.  Extremities show no edema, normal peripheral pulses. He has dry skin on plantar aspect of feet and flaking between 4th and 5th toes. He has brittle and brown  toenails.  Neurological is normal without focal findings.  ASSESSMENT:  healthy adult male  PLAN:  Well adult Check vit D Start losartan for HTN

## 2015-08-02 NOTE — Patient Instructions (Addendum)
Naven was seen today for annual exam.  Diagnoses and all orders for this visit:  Healthcare maintenance -     POCT urinalysis dipstick  Vitamin D deficiency -     Vitamin D, 25-hydroxy  Need for hepatitis C screening test -     Hepatitis C antibody, reflex  Tinea pedis of both feet -     COMPLETE METABOLIC PANEL WITH GFR -     terbinafine (LAMISIL AT) 1 % cream; Apply 1 application topically 2 (two) times daily.  Onychomycosis of toenail -     COMPLETE METABOLIC PANEL WITH GFR  Essential hypertension -     losartan (COZAAR) 25 MG tablet; Take 1 tablet (25 mg total) by mouth daily.  Allergic conjunctivitis, bilateral -     ketotifen (ZADITOR) 0.025 % ophthalmic solution; Place 1 drop into both eyes 2 (two) times daily.    Please sign up for mychart for lab results  If liver function normal, I will ordered Lamisil for toenail fungus  F.u in 3 weeks for pharmacty BP check  F/u with me in 3 months for HTN   Dr. Adrian Blackwater

## 2015-08-02 NOTE — Assessment & Plan Note (Addendum)
Checking LFTs has hx of ETOH abuse. Denies current Oral lamisil if LFTs normal

## 2015-08-02 NOTE — Progress Notes (Signed)
Annual physical No tobacco user  No pain today  No suicidal thought in the past two weeks

## 2015-08-03 LAB — VITAMIN D 25 HYDROXY (VIT D DEFICIENCY, FRACTURES): Vit D, 25-Hydroxy: 13 ng/mL — ABNORMAL LOW (ref 30–100)

## 2015-08-03 MED ORDER — VITAMIN D (ERGOCALCIFEROL) 1.25 MG (50000 UNIT) PO CAPS: 50000.0000 [IU] | ORAL_CAPSULE | ORAL | Status: DC

## 2015-08-03 MED ORDER — TERBINAFINE HCL 250 MG PO TABS: 250.0000 mg | ORAL_TABLET | Freq: Every day | ORAL | Status: DC

## 2015-08-03 NOTE — Addendum Note (Signed)
Addended by: Boykin Nearing on: 08/03/2015 08:57 AM   Modules accepted: Orders, SmartSet

## 2015-08-06 ENCOUNTER — Telehealth: Payer: Self-pay | Admitting: *Deleted

## 2015-08-06 NOTE — Telephone Encounter (Signed)
-----   Message from Boykin Nearing, MD sent at 08/03/2015  8:56 AM EST ----- Vit D improved but still low at 13 Normalized LFTs lamisil ordered Vit D ordered

## 2015-08-06 NOTE — Telephone Encounter (Signed)
LVM to return call.

## 2015-08-12 MED FILL — TERBINAFINE HCL 250 MG TAB: 250 | 30 days supply | Qty: 30 | Fill #0

## 2015-08-12 MED FILL — VIT D2 1.25 MG (50,000 UNIT: 1.25 MG | 56 days supply | Qty: 8 | Fill #0

## 2015-08-25 ENCOUNTER — Ambulatory Visit: Payer: Self-pay | Attending: Family Medicine | Admitting: Pharmacist

## 2015-08-25 ENCOUNTER — Encounter: Payer: Self-pay | Admitting: Pharmacist

## 2015-08-25 VITALS — BP 136/78 | HR 88

## 2015-08-25 DIAGNOSIS — Z79899 Other long term (current) drug therapy: Secondary | ICD-10-CM | POA: Insufficient documentation

## 2015-08-25 DIAGNOSIS — I10 Essential (primary) hypertension: Secondary | ICD-10-CM | POA: Insufficient documentation

## 2015-08-25 NOTE — Patient Instructions (Signed)
Blood pressure looks better today. Continue your losartan.  Check your blood pressure occasionally at the store and let us know if you get anything higher than 140/90  Follow up with Dr. Adrian Blackwater

## 2015-08-25 NOTE — Progress Notes (Signed)
S:    Patient arrives in good spirits.    Presents to the clinic for hypertension evaluation. Patient was referred on 08/02/15.  Patient was last seen by Primary Care Provider on 08/02/15.   Patient reports adherence with medications.  Current BP Medications include:  Losartan 50 mg daily  Dietary habits include: patient has been trying to cut back on salt intake.   O:   Last 3 Office BP readings: BP Readings from Last 3 Encounters:  08/25/15 136/78  08/02/15 144/84  03/23/15 123/81    BMET    Component Value Date/Time   NA 139 08/02/2015 1127   K 4.9 08/02/2015 1127   CL 102 08/02/2015 1127   CO2 25 08/02/2015 1127   GLUCOSE 106* 08/02/2015 1127   BUN 8 08/02/2015 1127   CREATININE 0.85 08/02/2015 1127   CREATININE 1.10 06/19/2014 2121   CALCIUM 9.5 08/02/2015 1127   GFRNONAA >89 08/02/2015 1127   GFRNONAA >90 08/13/2013 0800   GFRAA >89 08/02/2015 1127   GFRAA >90 08/13/2013 0800    A/P: Hypertension currently controlled on current medications.  Continued losartan 50 mg daily. Patient instructed to get refills on time and to not miss any doses. Also instructed patient to check blood pressure out of the office, at a local store or pharmacy, and let us know if he sees any readings >140/90.   Results reviewed and written information provided.   Total time in face-to-face counseling 10 minutes.   F/U Clinic Visit with Dr. Adrian Blackwater as directed

## 2016-05-02 ENCOUNTER — Ambulatory Visit: Payer: Self-pay | Admitting: Family Medicine

## 2016-05-04 ENCOUNTER — Ambulatory Visit: Payer: Self-pay

## 2016-05-16 ENCOUNTER — Ambulatory Visit: Payer: Self-pay | Admitting: Family Medicine

## 2016-06-12 ENCOUNTER — Encounter: Payer: Self-pay | Admitting: Family Medicine

## 2016-06-12 ENCOUNTER — Ambulatory Visit: Payer: Self-pay | Attending: Family Medicine | Admitting: Family Medicine

## 2016-06-12 VITALS — BP 166/96 | HR 99 | Temp 99.5°F | Ht 72.0 in | Wt 192.0 lb

## 2016-06-12 DIAGNOSIS — M79642 Pain in left hand: Secondary | ICD-10-CM | POA: Insufficient documentation

## 2016-06-12 DIAGNOSIS — M79641 Pain in right hand: Secondary | ICD-10-CM | POA: Insufficient documentation

## 2016-06-12 DIAGNOSIS — Z87891 Personal history of nicotine dependence: Secondary | ICD-10-CM | POA: Insufficient documentation

## 2016-06-12 DIAGNOSIS — L918 Other hypertrophic disorders of the skin: Secondary | ICD-10-CM | POA: Insufficient documentation

## 2016-06-12 DIAGNOSIS — Z79899 Other long term (current) drug therapy: Secondary | ICD-10-CM | POA: Insufficient documentation

## 2016-06-12 DIAGNOSIS — I10 Essential (primary) hypertension: Secondary | ICD-10-CM | POA: Insufficient documentation

## 2016-06-12 DIAGNOSIS — E559 Vitamin D deficiency, unspecified: Secondary | ICD-10-CM | POA: Insufficient documentation

## 2016-06-12 DIAGNOSIS — R200 Anesthesia of skin: Secondary | ICD-10-CM | POA: Insufficient documentation

## 2016-06-12 LAB — POCT GLYCOSYLATED HEMOGLOBIN (HGB A1C): HEMOGLOBIN A1C: 5.5

## 2016-06-12 MED ORDER — GABAPENTIN 100 MG PO CAPS: 100.0000 mg | ORAL_CAPSULE | Freq: Every day | ORAL | 3 refills | Status: DC

## 2016-06-12 MED ORDER — LOSARTAN POTASSIUM 25 MG PO TABS: 25.0000 mg | ORAL_TABLET | Freq: Every day | ORAL | 5 refills | Status: DC

## 2016-06-12 MED ORDER — NAPROXEN 500 MG PO TABS: 500.0000 mg | ORAL_TABLET | Freq: Every day | ORAL | 5 refills | Status: DC

## 2016-06-12 MED FILL — NAPROXEN 500 MG TABLET: 500 | 30 days supply | Qty: 30 | Fill #0

## 2016-06-12 MED FILL — GABAPENTIN 100 MG CAPSULE: 100 | 30 days supply | Qty: 90 | Fill #0

## 2016-06-12 NOTE — Progress Notes (Signed)
Subjective:  Patient ID: Stephen Bowers, male    DOB: 11-Sep-1960  Age: 55 y.o. MRN: [id number]  CC: Hand Pain   HPI Stephen Bowers has HTN he presents for   1. Finger tip pain: x 2 months. 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 He is having numbness at the finger tips and achiness in his hands. He has hx of similar symptoms in his right foot last year and was found to have vit D deficiency. Symptoms improved with vitamin D supplement. He denies rash and hand weakness.   2. Skin tag: under L axilla. X 2 years. Enlarging. Painless. Firm.   3. HTN: out of losartan. Has lost weight at work. No leg swelling. No CP or SOB.   Social History  Substance Use Topics  . Smoking status: Former Smoker    Types: Cigarettes  . Smokeless tobacco: Never Used  . Alcohol use 2.4 oz/week    4 Cans of beer per week     Comment: weekends, 4/week   Outpatient Medications Prior to Visit  Medication Sig Dispense Refill  . B Complex-C (SUPER B COMPLEX PO) Take 1 tablet by mouth.    [name] ketotifen (ZADITOR) 0.025 % ophthalmic solution Place 1 drop into both eyes 2 (two) times daily. 10 mL 1  . losartan (COZAAR) 25 MG tablet Take 1 tablet (25 mg total) by mouth daily. 30 tablet 5  . terbinafine (LAMISIL AT) 1 % cream Apply 1 application topically 2 (two) times daily. 30 g 3  . terbinafine (LAMISIL) 250 MG tablet Take 1 tablet (250 mg total) by mouth daily. 30 tablet 2  . Vitamin D, Ergocalciferol, (DRISDOL) 50000 units CAPS capsule Take 1 capsule (50,000 Units total) by mouth every 7 (seven) days. For 8 weeks 8 capsule 0   No facility-administered medications prior to visit.     ROS Review of Systems  Constitutional: Negative for chills, fatigue, fever and unexpected weight change.  Eyes: Negative for visual disturbance.  Respiratory: Negative for cough and shortness of breath.   Cardiovascular: Negative for chest pain, palpitations and leg swelling.    Gastrointestinal: Negative for abdominal pain, blood in stool, constipation, diarrhea, nausea and vomiting.  Endocrine: Negative for polydipsia, polyphagia and polyuria.  Musculoskeletal: Negative for arthralgias, back pain, gait problem, myalgias and neck pain.  Skin: Negative for rash.  Allergic/Immunologic: Negative for immunocompromised state.  Neurological: Positive for numbness (fingertips ).  Hematological: Negative for adenopathy. Does not bruise/bleed easily.  Psychiatric/Behavioral: Negative for dysphoric mood, sleep disturbance and suicidal ideas. The patient is not nervous/anxious.     Objective:  BP (!) 166/96 (BP Location: Left Arm, Patient Position: Sitting, Cuff Size: Small)   Pulse 99   Temp 99.5 F (37.5 C) (Oral)   Ht 6' (1.829 m)   Wt 192 lb (87.1 kg)   SpO2 97%   BMI 26.04 kg/m   BP/Weight 06/12/2016 08/25/2015 [id number]  Systolic BP [id number] [id number]  Diastolic BP 96 78 84  Wt. (Lbs) 192 - 205  BMI 26.04 - 27.8   Wt Readings from Last 3 Encounters:  06/12/16 192 lb (87.1 kg)  08/02/15 205 lb (93 kg)  03/02/15 211 lb (95.7 kg)    Physical Exam  Constitutional: He appears well-developed and well-nourished. No distress.  HENT:  Head: Normocephalic and atraumatic.  Cardiovascular: Intact distal pulses.   Pulmonary/Chest: Effort normal.  Musculoskeletal: He exhibits no edema or tenderness.  Right hand: Normal.       Left hand: Normal.  Neurological: He is alert.  Skin: Skin is warm and dry. No rash noted. No erythema.     Psychiatric: He has a normal mood and affect.    Lab Results  Component Value Date   HGBA1C 5.5 06/12/2016    Assessment & Plan:  Stephen Bowers was seen today for hand pain.  Diagnoses and all orders for this visit:  Essential hypertension -     COMPLETE METABOLIC PANEL WITH GFR -     losartan (COZAAR) 25 MG tablet; Take 1 tablet (25 mg total) by mouth daily.  Numbness of fingers of both hands -     HgB A1c -     gabapentin  (NEURONTIN) 100 MG capsule; Take 1-3 capsules (100-300 mg total) by mouth at bedtime. -     naproxen (NAPROSYN) 500 MG tablet; Take 1 tablet (500 mg total) by mouth daily with supper.  Vitamin D deficiency -     Vitamin D, 25-hydroxy  Vitamin D insufficiency -     Vitamin D, Ergocalciferol, (DRISDOL) 50000 units CAPS capsule; Take 1 capsule (50,000 Units total) by mouth every 30 (thirty) days. For 8 weeks   There are no diagnoses linked to this encounter.  No orders of the defined types were placed in this encounter.   Follow-up: Return in about 3 weeks (around 07/03/2016) for removal of L axilla skin tag .   Boykin Nearing MD

## 2016-06-12 NOTE — Progress Notes (Signed)
Pt is here today for numbness in hands and fingertips. Pt states that he has seen blood in his seamen.  Pt is getting flu shot today.

## 2016-06-12 NOTE — Patient Instructions (Addendum)
Stephen Bowers was seen today for hand pain.  Diagnoses and all orders for this visit:  Essential hypertension -     COMPLETE METABOLIC PANEL WITH GFR -     losartan (COZAAR) 25 MG tablet; Take 1 tablet (25 mg total) by mouth daily.  Numbness of fingers of both hands -     HgB A1c -     gabapentin (NEURONTIN) 100 MG capsule; Take 1-3 capsules (100-300 mg total) by mouth at bedtime. -     naproxen (NAPROSYN) 500 MG tablet; Take 1 tablet (500 mg total) by mouth daily with supper.  Vitamin D deficiency -     Vitamin D, 25-hydroxy   Take gabapentin 100 mg nightly the first week  Then 200 mg nightly the second week  Then 300 mg nightly the third week  F/u in 3 weeks for skin tags removal under L arm, (friday afternoon preferred) 30 minute procedure visit   Dr. Adrian Blackwater

## 2016-06-14 DIAGNOSIS — R200 Anesthesia of skin: Secondary | ICD-10-CM | POA: Insufficient documentation

## 2016-06-14 DIAGNOSIS — E559 Vitamin D deficiency, unspecified: Secondary | ICD-10-CM | POA: Insufficient documentation

## 2016-06-14 LAB — COMPLETE METABOLIC PANEL WITH GFR
ALBUMIN: 4.6 g/dL (ref 3.6–5.1)
ALK PHOS: 28 U/L — AB (ref 40–115)
ALT: 46 U/L (ref 9–46)
AST: 69 U/L — ABNORMAL HIGH (ref 10–35)
BILIRUBIN TOTAL: 0.3 mg/dL (ref 0.2–1.2)
BUN: 16 mg/dL (ref 7–25)
CALCIUM: 9.6 mg/dL (ref 8.6–10.3)
CO2: 25 mmol/L (ref 20–31)
Chloride: 100 mmol/L (ref 98–110)
Creat: 0.95 mg/dL (ref 0.70–1.33)
Glucose, Bld: 92 mg/dL (ref 65–99)
POTASSIUM: 4.8 mmol/L (ref 3.5–5.3)
SODIUM: 137 mmol/L (ref 135–146)
TOTAL PROTEIN: 8.2 g/dL — AB (ref 6.1–8.1)

## 2016-06-14 LAB — VITAMIN D 25 HYDROXY (VIT D DEFICIENCY, FRACTURES): Vit D, 25-Hydroxy: 20 ng/mL — ABNORMAL LOW (ref 30–100)

## 2016-06-14 MED ORDER — VITAMIN D (ERGOCALCIFEROL) 1.25 MG (50000 UNIT) PO CAPS: 50000.0000 [IU] | ORAL_CAPSULE | ORAL | 3 refills | Status: DC

## 2016-06-14 NOTE — Assessment & Plan Note (Signed)
Elevate Med non compliant Restart losartan 25 mg daily

## 2016-06-14 NOTE — Assessment & Plan Note (Signed)
Neuropathic pains in fingers both hands  Normal A1c Low vit D  Plan: Gabapentin, naproxen Vit D supplement

## 2016-06-14 NOTE — Assessment & Plan Note (Signed)
Improved with supplement Continue q 30 days vit D

## 2016-06-16 ENCOUNTER — Telehealth: Payer: Self-pay

## 2016-06-16 NOTE — Telephone Encounter (Signed)
Pt was called but there was no VM set up to leave a message.

## 2016-06-21 ENCOUNTER — Telehealth: Payer: Self-pay

## 2016-06-21 NOTE — Telephone Encounter (Signed)
Pt was called but there is no VM set up to leave a message.

## 2016-06-30 ENCOUNTER — Encounter: Payer: Self-pay | Admitting: Family Medicine

## 2016-06-30 ENCOUNTER — Ambulatory Visit: Payer: Self-pay | Attending: Family Medicine | Admitting: Family Medicine

## 2016-06-30 ENCOUNTER — Ambulatory Visit: Payer: Self-pay | Admitting: Family Medicine

## 2016-06-30 VITALS — BP 152/84 | HR 71 | Temp 98.2°F | Ht 72.0 in | Wt 188.4 lb

## 2016-06-30 DIAGNOSIS — L821 Other seborrheic keratosis: Secondary | ICD-10-CM | POA: Insufficient documentation

## 2016-06-30 DIAGNOSIS — I10 Essential (primary) hypertension: Secondary | ICD-10-CM

## 2016-06-30 DIAGNOSIS — L918 Other hypertrophic disorders of the skin: Secondary | ICD-10-CM | POA: Insufficient documentation

## 2016-06-30 MED ORDER — LOSARTAN POTASSIUM 50 MG PO TABS: 50.0000 mg | ORAL_TABLET | Freq: Every day | ORAL | 5 refills | Status: DC

## 2016-06-30 NOTE — Progress Notes (Signed)
Skin Tag Removal Procedure Note  Pre-operative Diagnosis: Classic skin tags (acrochordon)  Post-operative Diagnosis: Classic skin tags (acrochordon)  Locations:left axilla  Indications: relief of discomfort   Anesthesia: Lidocaine 1% with epinephrine without added sodium bicarbonate  Procedure Details  The risks (including bleeding and infection) and benefits of the procedure and Written informed consent obtained. Using sterile iris scissors, multiple skin tags were snipped off at their bases after cleansing with Betadine.  Bleeding was controlled by pressure.   Findings: Hard lesion lesions sent for pathological exam.  Condition: Stable  Complications: none.  Plan: 1. Instructed to keep the wounds dry and covered for 24-48h and clean thereafter. 2. Warning signs of infection were reviewed.   3. Recommended that the patient use OTC acetaminophen and OTC ibuprofen as needed for pain.  4. Return as needed.

## 2016-06-30 NOTE — Patient Instructions (Signed)
Stephen Bowers was seen today for skin problem.  Diagnoses and all orders for this visit:  Cutaneous skin tags -     Dermatology pathology  Essential hypertension -     losartan (COZAAR) 50 MG tablet; Take 1 tablet (50 mg total) by mouth daily.   1. Keep the wounds dry and covered for 24-48h and clean thereafter. 2. Warning signs of infection were reviewed.   3. Recommended that the patient use OTC acetaminophen and OTC ibuprofen as needed for pain.  4. Return as needed.  F/u in 6 weeks for HTN  Dr. Adrian Blackwater

## 2016-06-30 NOTE — Assessment & Plan Note (Signed)
Hypertensive today Increase losartan to 50 mg daily

## 2016-07-06 ENCOUNTER — Telehealth: Payer: Self-pay

## 2016-07-06 NOTE — Telephone Encounter (Addendum)
Unsuccessful call attempt, phone rang greater than 8 times/voicemail option When  Patient calls please advise:-  ----- Benign pathology from removed skin tag

## 2016-07-07 ENCOUNTER — Telehealth: Payer: Self-pay

## 2016-07-07 NOTE — Telephone Encounter (Signed)
Pt' s wife was called and informed of lab results.

## 2016-07-21 MED FILL — GABAPENTIN 100 MG CAPSULE: 100 | 30 days supply | Qty: 90 | Fill #1

## 2016-07-21 MED FILL — NAPROXEN 500 MG TABLET: 500 | 30 days supply | Qty: 30 | Fill #1

## 2016-09-04 MED FILL — NAPROXEN 500 MG TABLET: 500 | 30 days supply | Qty: 30 | Fill #2

## 2016-09-04 MED FILL — ?GABAPENTIN 100 MG CAP: 30 days supply | Qty: 90 | Fill #2

## 2016-10-16 ENCOUNTER — Encounter (HOSPITAL_COMMUNITY): Payer: Self-pay | Admitting: Emergency Medicine

## 2016-10-16 ENCOUNTER — Observation Stay (HOSPITAL_COMMUNITY): Payer: Self-pay

## 2016-10-16 ENCOUNTER — Emergency Department (HOSPITAL_COMMUNITY): Payer: Self-pay

## 2016-10-16 ENCOUNTER — Inpatient Hospital Stay (HOSPITAL_COMMUNITY)
Admission: EM | Admit: 2016-10-16 | Discharge: 2016-10-19 | DRG: 683 | Disposition: A | Discharge status: Home / Self Care | Payer: Self-pay | Attending: Internal Medicine | Admitting: Internal Medicine

## 2016-10-16 DIAGNOSIS — Z803 Family history of malignant neoplasm of breast: Secondary | ICD-10-CM

## 2016-10-16 DIAGNOSIS — Z833 Family history of diabetes mellitus: Secondary | ICD-10-CM

## 2016-10-16 DIAGNOSIS — E871 Hypo-osmolality and hyponatremia: Secondary | ICD-10-CM | POA: Diagnosis present

## 2016-10-16 DIAGNOSIS — Z841 Family history of disorders of kidney and ureter: Secondary | ICD-10-CM

## 2016-10-16 DIAGNOSIS — D6959 Other secondary thrombocytopenia: Secondary | ICD-10-CM | POA: Diagnosis present

## 2016-10-16 DIAGNOSIS — K760 Fatty (change of) liver, not elsewhere classified: Secondary | ICD-10-CM | POA: Diagnosis present

## 2016-10-16 DIAGNOSIS — M791 Myalgia: Secondary | ICD-10-CM

## 2016-10-16 DIAGNOSIS — D649 Anemia, unspecified: Secondary | ICD-10-CM | POA: Diagnosis present

## 2016-10-16 DIAGNOSIS — N189 Chronic kidney disease, unspecified: Secondary | ICD-10-CM | POA: Diagnosis present

## 2016-10-16 DIAGNOSIS — E559 Vitamin D deficiency, unspecified: Secondary | ICD-10-CM | POA: Diagnosis present

## 2016-10-16 DIAGNOSIS — G8929 Other chronic pain: Secondary | ICD-10-CM | POA: Diagnosis present

## 2016-10-16 DIAGNOSIS — I129 Hypertensive chronic kidney disease with stage 1 through stage 4 chronic kidney disease, or unspecified chronic kidney disease: Secondary | ICD-10-CM | POA: Diagnosis present

## 2016-10-16 DIAGNOSIS — E875 Hyperkalemia: Secondary | ICD-10-CM | POA: Diagnosis present

## 2016-10-16 DIAGNOSIS — G629 Polyneuropathy, unspecified: Secondary | ICD-10-CM | POA: Diagnosis present

## 2016-10-16 DIAGNOSIS — Z87891 Personal history of nicotine dependence: Secondary | ICD-10-CM

## 2016-10-16 DIAGNOSIS — Z8379 Family history of other diseases of the digestive system: Secondary | ICD-10-CM

## 2016-10-16 DIAGNOSIS — Z811 Family history of alcohol abuse and dependence: Secondary | ICD-10-CM

## 2016-10-16 DIAGNOSIS — N179 Acute kidney failure, unspecified: Principal | ICD-10-CM | POA: Insufficient documentation

## 2016-10-16 DIAGNOSIS — E785 Hyperlipidemia, unspecified: Secondary | ICD-10-CM

## 2016-10-16 DIAGNOSIS — M7918 Myalgia, other site: Secondary | ICD-10-CM

## 2016-10-16 DIAGNOSIS — I700 Atherosclerosis of aorta: Secondary | ICD-10-CM | POA: Diagnosis present

## 2016-10-16 DIAGNOSIS — M545 Low back pain, unspecified: Secondary | ICD-10-CM | POA: Diagnosis present

## 2016-10-16 DIAGNOSIS — R1012 Left upper quadrant pain: Secondary | ICD-10-CM

## 2016-10-16 DIAGNOSIS — I10 Essential (primary) hypertension: Secondary | ICD-10-CM

## 2016-10-16 DIAGNOSIS — R7989 Other specified abnormal findings of blood chemistry: Secondary | ICD-10-CM | POA: Diagnosis present

## 2016-10-16 DIAGNOSIS — R200 Anesthesia of skin: Secondary | ICD-10-CM

## 2016-10-16 DIAGNOSIS — R109 Unspecified abdominal pain: Secondary | ICD-10-CM

## 2016-10-16 DIAGNOSIS — E7800 Pure hypercholesterolemia, unspecified: Secondary | ICD-10-CM | POA: Diagnosis present

## 2016-10-16 HISTORY — DX: Other chronic pain: G89.29

## 2016-10-16 HISTORY — DX: Myalgia, other site: M79.18

## 2016-10-16 LAB — URINALYSIS, ROUTINE W REFLEX MICROSCOPIC
BILIRUBIN URINE: NEGATIVE
Glucose, UA: NEGATIVE mg/dL
Hgb urine dipstick: NEGATIVE
KETONES UR: 5 mg/dL — AB
LEUKOCYTES UA: NEGATIVE
Nitrite: NEGATIVE
PROTEIN: 30 mg/dL — AB
Specific Gravity, Urine: 1.018 (ref 1.005–1.030)
pH: 5 (ref 5.0–8.0)

## 2016-10-16 LAB — BASIC METABOLIC PANEL
ANION GAP: 18 — AB (ref 5–15)
BUN: 19 mg/dL (ref 6–20)
CHLORIDE: 95 mmol/L — AB (ref 101–111)
CO2: 16 mmol/L — ABNORMAL LOW (ref 22–32)
Calcium: 8.5 mg/dL — ABNORMAL LOW (ref 8.9–10.3)
Creatinine, Ser: 2.35 mg/dL — ABNORMAL HIGH (ref 0.61–1.24)
GFR calc Af Amer: 34 mL/min — ABNORMAL LOW (ref >60)
GFR, EST NON AFRICAN AMERICAN: 30 mL/min — AB (ref >60)
GLUCOSE: 97 mg/dL (ref 65–99)
POTASSIUM: 5.4 mmol/L — AB (ref 3.5–5.1)
Sodium: 129 mmol/L — ABNORMAL LOW (ref 135–145)

## 2016-10-16 LAB — I-STAT CG4 LACTIC ACID, ED
LACTIC ACID, VENOUS: 1.04 mmol/L (ref 0.5–1.9)
Lactic Acid, Venous: 1.76 mmol/L (ref 0.5–1.9)

## 2016-10-16 LAB — CBC WITH DIFFERENTIAL/PLATELET
BASOS PCT: 0 %
Basophils Absolute: 0 10*3/uL (ref 0.0–0.1)
EOS ABS: 0 10*3/uL (ref 0.0–0.7)
EOS PCT: 0 %
HCT: 43.3 % (ref 39.0–52.0)
HEMOGLOBIN: 15.4 g/dL (ref 13.0–17.0)
Lymphocytes Relative: 18 %
Lymphs Abs: 1.8 10*3/uL (ref 0.7–4.0)
MCH: 34.8 pg — AB (ref 26.0–34.0)
MCHC: 35.6 g/dL (ref 30.0–36.0)
MCV: 98 fL (ref 78.0–100.0)
Monocytes Absolute: 0.5 10*3/uL (ref 0.1–1.0)
Monocytes Relative: 5 %
NEUTROS PCT: 77 %
Neutro Abs: 7.9 10*3/uL — ABNORMAL HIGH (ref 1.7–7.7)
PLATELETS: 102 10*3/uL — AB (ref 150–400)
RBC: 4.42 MIL/uL (ref 4.22–5.81)
RDW: 13.9 % (ref 11.5–15.5)
WBC: 10.3 10*3/uL (ref 4.0–10.5)

## 2016-10-16 LAB — I-STAT CHEM 8, ED
BUN: 23 mg/dL — AB (ref 6–20)
CHLORIDE: 104 mmol/L (ref 101–111)
Calcium, Ion: 1.01 mmol/L — ABNORMAL LOW (ref 1.15–1.40)
Creatinine, Ser: 2 mg/dL — ABNORMAL HIGH (ref 0.61–1.24)
GLUCOSE: 94 mg/dL (ref 65–99)
HCT: 39 % (ref 39.0–52.0)
Hemoglobin: 13.3 g/dL (ref 13.0–17.0)
POTASSIUM: 4.7 mmol/L (ref 3.5–5.1)
Sodium: 134 mmol/L — ABNORMAL LOW (ref 135–145)
TCO2: 19 mmol/L (ref 0–100)

## 2016-10-16 LAB — COMPREHENSIVE METABOLIC PANEL
ALBUMIN: 3.5 g/dL (ref 3.5–5.0)
ALT: 65 U/L — ABNORMAL HIGH (ref 17–63)
ANION GAP: 18 — AB (ref 5–15)
AST: 148 U/L — AB (ref 15–41)
Alkaline Phosphatase: 58 U/L (ref 38–126)
BUN: 18 mg/dL (ref 6–20)
CHLORIDE: 96 mmol/L — AB (ref 101–111)
CO2: 15 mmol/L — AB (ref 22–32)
Calcium: 8.7 mg/dL — ABNORMAL LOW (ref 8.9–10.3)
Creatinine, Ser: 2.37 mg/dL — ABNORMAL HIGH (ref 0.61–1.24)
GFR calc Af Amer: 34 mL/min — ABNORMAL LOW (ref >60)
GFR calc non Af Amer: 29 mL/min — ABNORMAL LOW (ref >60)
GLUCOSE: 101 mg/dL — AB (ref 65–99)
POTASSIUM: 6.2 mmol/L — AB (ref 3.5–5.1)
SODIUM: 129 mmol/L — AB (ref 135–145)
Total Bilirubin: 3.5 mg/dL — ABNORMAL HIGH (ref 0.3–1.2)

## 2016-10-16 LAB — HEPATIC FUNCTION PANEL
ALT: 57 U/L (ref 17–63)
AST: 104 U/L — AB (ref 15–41)
Albumin: 3.1 g/dL — ABNORMAL LOW (ref 3.5–5.0)
Alkaline Phosphatase: 51 U/L (ref 38–126)
BILIRUBIN DIRECT: 0.5 mg/dL (ref 0.1–0.5)
Indirect Bilirubin: 1.5 mg/dL — ABNORMAL HIGH (ref 0.3–0.9)
TOTAL PROTEIN: 7.8 g/dL (ref 6.5–8.1)
Total Bilirubin: 2 mg/dL — ABNORMAL HIGH (ref 0.3–1.2)

## 2016-10-16 LAB — PROTEIN / CREATININE RATIO, URINE
Creatinine, Urine: 430 mg/dL
Protein Creatinine Ratio: 0.08 mg/mg{Cre} (ref 0.00–0.15)
Total Protein, Urine: 33 mg/dL

## 2016-10-16 LAB — PHOSPHORUS: Phosphorus: 4 mg/dL (ref 2.5–4.6)

## 2016-10-16 LAB — PROTIME-INR
INR: 1.06
PROTHROMBIN TIME: 13.8 s (ref 11.4–15.2)

## 2016-10-16 LAB — MAGNESIUM: MAGNESIUM: 2.5 mg/dL — AB (ref 1.7–2.4)

## 2016-10-16 MED ORDER — ONDANSETRON HCL 4 MG PO TABS: 4.0000 mg | ORAL_TABLET | Freq: Four times a day (QID) | ORAL | Status: DC | PRN

## 2016-10-16 MED ORDER — VITAMIN D (ERGOCALCIFEROL) 1.25 MG (50000 UNIT) PO CAPS: 50000.0000 [IU] | ORAL_CAPSULE | ORAL | Status: DC

## 2016-10-16 MED ORDER — ONDANSETRON HCL 4 MG/2ML IJ SOLN
4.0000 mg | Freq: Once | INTRAMUSCULAR | Status: AC
  Administered 2016-10-16: 4 mg via INTRAVENOUS
  Filled 2016-10-16: qty 2

## 2016-10-16 MED ORDER — DEXTROSE 5 % IV SOLN
1.0000 g | INTRAVENOUS | Status: DC
  Administered 2016-10-16 – 2016-10-17 (×2): 1 g via INTRAVENOUS
  Filled 2016-10-16 (×2): qty 10

## 2016-10-16 MED ORDER — BISACODYL 10 MG RE SUPP: 10.0000 mg | Freq: Every day | RECTAL | Status: DC | PRN

## 2016-10-16 MED ORDER — HYDRALAZINE HCL 20 MG/ML IJ SOLN: 5.0000 mg | Freq: Three times a day (TID) | INTRAMUSCULAR | Status: DC | PRN

## 2016-10-16 MED ORDER — GUAIFENESIN ER 600 MG PO TB12: 600.0000 mg | ORAL_TABLET | Freq: Two times a day (BID) | ORAL | Status: DC | PRN

## 2016-10-16 MED ORDER — SODIUM CHLORIDE 0.9 % IV BOLUS (SEPSIS)
1000.0000 mL | Freq: Once | INTRAVENOUS | Status: AC
  Administered 2016-10-16: 1000 mL via INTRAVENOUS

## 2016-10-16 MED ORDER — HEPARIN SODIUM (PORCINE) 5000 UNIT/ML IJ SOLN: 5000.0000 [IU] | Freq: Three times a day (TID) | INTRAMUSCULAR | Status: DC

## 2016-10-16 MED ORDER — SODIUM CHLORIDE 0.9 % IV SOLN
INTRAVENOUS | Status: DC
  Administered 2016-10-16 – 2016-10-17 (×2): via INTRAVENOUS

## 2016-10-16 MED ORDER — HYDROCODONE-ACETAMINOPHEN 5-325 MG PO TABS
1.0000 | ORAL_TABLET | ORAL | Status: DC | PRN
Start: 2016-10-16 — End: 2016-10-18
  Administered 2016-10-16 – 2016-10-17 (×2): 2 via ORAL
  Filled 2016-10-16 (×2): qty 2

## 2016-10-16 MED ORDER — ACETAMINOPHEN 650 MG RE SUPP: 650.0000 mg | Freq: Four times a day (QID) | RECTAL | Status: DC | PRN

## 2016-10-16 MED ORDER — MAGNESIUM CITRATE PO SOLN: 1.0000 | Freq: Once | ORAL | Status: DC | PRN

## 2016-10-16 MED ORDER — ACETAMINOPHEN 325 MG PO TABS: 650.0000 mg | ORAL_TABLET | Freq: Four times a day (QID) | ORAL | Status: DC | PRN

## 2016-10-16 MED ORDER — GABAPENTIN 100 MG PO CAPS
300.0000 mg | ORAL_CAPSULE | Freq: Every day | ORAL | Status: DC
  Administered 2016-10-16 – 2016-10-18 (×3): 300 mg via ORAL
  Filled 2016-10-16 (×3): qty 3

## 2016-10-16 MED ORDER — LOSARTAN POTASSIUM 50 MG PO TABS: 50.0000 mg | ORAL_TABLET | Freq: Every day | ORAL | Status: DC

## 2016-10-16 MED ORDER — MORPHINE SULFATE (PF) 4 MG/ML IV SOLN
4.0000 mg | Freq: Once | INTRAVENOUS | Status: AC
  Administered 2016-10-16: 4 mg via INTRAVENOUS
  Filled 2016-10-16: qty 1

## 2016-10-16 MED ORDER — SENNOSIDES-DOCUSATE SODIUM 8.6-50 MG PO TABS
1.0000 | ORAL_TABLET | Freq: Every evening | ORAL | Status: DC | PRN
  Administered 2016-10-18: 1 via ORAL
  Filled 2016-10-16: qty 1

## 2016-10-16 MED ORDER — ONDANSETRON HCL 4 MG/2ML IJ SOLN: 4.0000 mg | Freq: Four times a day (QID) | INTRAMUSCULAR | Status: DC | PRN

## 2016-10-16 NOTE — ED Notes (Signed)
Pt states he took a laxative on Saturday due to constipation from home meds. Belly is tender LLQ. Denies fevers.

## 2016-10-16 NOTE — Progress Notes (Signed)
Pharmacy Antibiotic Note  Stephen Bowers is a 56 y.o. male admitted on 10/16/2016 with possible pyelonephritis.  Pharmacy has been consulted for ceftriaxone dosing.  Plan: 1) Ceftriaxone 1g IV q24 2) Pharmacy will sign off as no dose adjustments needed  Height: 6' (182.9 cm) Weight: 188 lb (85.3 kg) IBW/kg (Calculated) : 77.6  Temp (24hrs), Avg:97.8 F (36.6 C), Min:97.8 F (36.6 C), Max:97.8 F (36.6 C)   Recent Labs Lab 10/16/16 1106 10/16/16 1134 10/16/16 1159 10/16/16 1239 10/16/16 1338 10/16/16 1339  WBC 10.3  --   --   --   --   --   CREATININE  --   --  2.37* 2.35* 2.00*  --   LATICACIDVEN  --  1.76  --   --   --  1.04    Estimated Creatinine Clearance: 46.3 mL/min (A) (by C-G formula based on SCr of 2 mg/dL (H)).    No Known Allergies  Antimicrobials this admission: 4/16 Ceftriaxone >>  Dose adjustments this admission: n/a  Microbiology results: None  Thank you for allowing pharmacy to be a part of this patient's care.  Deboraha Sprang 10/16/2016 4:40 PM

## 2016-10-16 NOTE — Plan of Care (Signed)
Received c/s for this pt.  Briefly, AKI with baseline serum cr 0.95--> 2.00.  HTN on losartan.   Have ordered ANA complements, hep B/C (HIV already ordered), SPEP, UP/C and culture.  CT shows some perinephric stranding and cyst.    [place] pending.    Full c/s to follow.  Madelon Lips MD [facility] pgr [phone]

## 2016-10-16 NOTE — ED Triage Notes (Signed)
Pt states Friday he did not feel well and Saturday he woke up with left lower quadrant pain that goes into his left flank. Pt reports nausea and vomiting.

## 2016-10-16 NOTE — ED Provider Notes (Signed)
[facility] DEPT Provider Note   CSN: [id number] Arrival date & time: 10/16/16  1023     History   Chief Complaint Chief Complaint  Patient presents with  . Abdominal Pain    HPI Stephen Bowers is a 56 y.o. male.  The history is provided by the patient and medical records. No language interpreter was used.  Abdominal Pain   Associated symptoms include nausea, vomiting and constipation. Pertinent negatives include fever.   Stephen Bowers is a 56 y.o. male  with a PMH of HTN, HLD who presents to the Emergency Department complaining of worsening left-sided abdominal pain x 3 days. Associated with nausea and 2 episodes of emesis. He notes decreased appetite due to symptoms. He has tolerated fluids with no emesis, but after eating solid foods, threw up. No fevers/chills. Endorses frequent constipation - typically relieved with laxative. Had a normal BM with no blood on Saturday, but has not had BM since. Did take laxative on Saturday. No alleviating or aggravating factors noted.   Past Medical History:  Diagnosis Date  . High cholesterol Dx 2011    Patient Active Problem List   Diagnosis Date Noted  . Acute renal failure (ARF) ([place]) 10/16/2016  . Hyperlipidemia 10/16/2016  . Elevated serum creatinine 10/16/2016  . Abdominal pain 10/16/2016  . Vitamin D insufficiency 06/14/2016  . Numbness of fingers of both hands 06/14/2016  . Tinea pedis 08/02/2015  . Onychomycosis of toenail 08/02/2015  . HTN (hypertension) 03/02/2015  . Low back pain potentially associated with radiculopathy 03/30/2014    No past surgical history on file.     Home Medications    Prior to Admission medications   Medication Sig Start Date End Date Taking? Authorizing Provider  B Complex-C (SUPER B COMPLEX PO) Take 1 tablet by mouth.    Historical Provider, MD  gabapentin (NEURONTIN) 100 MG capsule Take 1-3 capsules (100-300 mg total) by mouth at bedtime. 06/12/16   Josalyn Funches, MD    ketotifen (ZADITOR) 0.025 % ophthalmic solution Place 1 drop into both eyes 2 (two) times daily. 08/02/15   Josalyn Funches, MD  losartan (COZAAR) 50 MG tablet Take 1 tablet (50 mg total) by mouth daily. 06/30/16   Josalyn Funches, MD  naproxen (NAPROSYN) 500 MG tablet Take 1 tablet (500 mg total) by mouth daily with supper. 06/12/16   Josalyn Funches, MD  Vitamin D, Ergocalciferol, (DRISDOL) 50000 units CAPS capsule Take 1 capsule (50,000 Units total) by mouth every 30 (thirty) days. For 8 weeks 06/14/16   Boykin Nearing, MD    Family History Family History  Problem Relation Age of Onset  . Liver disease Father   . Alcohol abuse Father   . Diabetes Sister   . Obesity Sister   . Cancer Mother     breast   . Colon cancer Neg Hx   . Esophageal cancer Neg Hx   . Rectal cancer Neg Hx   . Stomach cancer Neg Hx     Social History Social History  Substance Use Topics  . Smoking status: Former Smoker    Types: Cigarettes  . Smokeless tobacco: Never Used  . Alcohol use 2.4 oz/week    4 Cans of beer per week     Comment: weekends, 4/week     Allergies   Patient has no known allergies.   Review of Systems Review of Systems  Constitutional: Negative for fever.  Gastrointestinal: Positive for abdominal pain, constipation, nausea and vomiting. Negative for blood in stool.  All other systems reviewed and are negative.    Physical Exam Updated Vital Signs BP 101/73   Pulse 85   Temp 97.8 F (36.6 C) (Oral)   Resp 16   Ht 6' (1.829 m)   Wt 85.3 kg   SpO2 99%   BMI 25.50 kg/m   Physical Exam  Constitutional: He is oriented to person, place, and time. He appears well-developed and well-nourished. No distress.  HENT:  Head: Normocephalic and atraumatic.  Cardiovascular: Normal rate, regular rhythm and normal heart sounds.   No murmur heard. Pulmonary/Chest: Effort normal and breath sounds normal. No respiratory distress.  Abdominal: Soft. Bowel sounds are normal. He  exhibits no distension. There is tenderness. There is guarding. There is no rebound.    Musculoskeletal: He exhibits no edema.  Neurological: He is alert and oriented to person, place, and time.  Skin: Skin is warm and dry.  Nursing note and vitals reviewed.    ED Treatments / Results  Labs (all labs ordered are listed, but only abnormal results are displayed) Labs Reviewed  CBC WITH DIFFERENTIAL/PLATELET - Abnormal; Notable for the following:       Result Value   MCH 34.8 (*)    Platelets 102 (*)    Neutro Abs 7.9 (*)    All other components within normal limits  URINALYSIS, ROUTINE W REFLEX MICROSCOPIC - Abnormal; Notable for the following:    Color, Urine AMBER (*)    APPearance HAZY (*)    Ketones, ur 5 (*)    Protein, ur 30 (*)    Bacteria, UA RARE (*)    Squamous Epithelial / LPF 0-5 (*)    All other components within normal limits  COMPREHENSIVE METABOLIC PANEL - Abnormal; Notable for the following:    Sodium 129 (*)    Potassium 6.2 (*)    Chloride 96 (*)    CO2 15 (*)    Glucose, Bld 101 (*)    Creatinine, Ser 2.37 (*)    Calcium 8.7 (*)    AST 148 (*)    ALT 65 (*)    Total Bilirubin 3.5 (*)    GFR calc non Af Amer 29 (*)    GFR calc Af Amer 34 (*)    Anion gap 18 (*)    All other components within normal limits  BASIC METABOLIC PANEL - Abnormal; Notable for the following:    Sodium 129 (*)    Potassium 5.4 (*)    Chloride 95 (*)    CO2 16 (*)    Creatinine, Ser 2.35 (*)    Calcium 8.5 (*)    GFR calc non Af Amer 30 (*)    GFR calc Af Amer 34 (*)    Anion gap 18 (*)    All other components within normal limits  I-STAT CHEM 8, ED - Abnormal; Notable for the following:    Sodium 134 (*)    BUN 23 (*)    Creatinine, Ser 2.00 (*)    Calcium, Ion 1.01 (*)    All other components within normal limits  URINE CULTURE  CULTURE, BLOOD (ROUTINE X 2)  CULTURE, BLOOD (ROUTINE X 2)  PROTIME-INR  HEPATIC FUNCTION PANEL  HIV ANTIBODY (ROUTINE TESTING)    PROTEIN ELECTROPHORESIS, SERUM  IMMUNOFIXATION ELECTROPHORESIS  IMMUNOFIXATION, URINE  MAGNESIUM  PHOSPHORUS  ANTINUCLEAR ANTIBODIES, IFA  C3 COMPLEMENT  C4 COMPLEMENT  PROTEIN / CREATININE RATIO, URINE  HEPATITIS B SURFACE ANTIGEN  HEPATITIS C ANTIBODY (REFLEX)  I-STAT CG4 LACTIC  ACID, ED  I-STAT CG4 LACTIC ACID, ED    EKG  EKG Interpretation None       Radiology Ct Abdomen Pelvis Wo Contrast  Result Date: 10/16/2016 CLINICAL DATA:  56 y/o M; left lower quadrant pain radiating to the left flank. EXAM: CT ABDOMEN AND PELVIS WITHOUT CONTRAST TECHNIQUE: Multidetector CT imaging of the abdomen and pelvis was performed following the standard protocol without IV contrast. COMPARISON:  06/19/2014 abdominal ultrasound FINDINGS: Lower chest: No acute abnormality. Hepatobiliary: Well-circumscribed 13 mm low-attenuation focus within segment 4B each likely representing a cyst. Hepatic steatosis. Normal gallbladder. No intra or extrahepatic biliary ductal dilatation. Pancreas: Unremarkable. No pancreatic ductal dilatation or surrounding inflammatory changes. Spleen: Normal in size without focal abnormality. Adrenals/Urinary Tract: Mild motion artifact through the kidneys partially obscures renal parenchyma. Left kidney interpolar cortical defect with fat attenuation probably representing scarring. 13 mm intermediate attenuation well-circumscribed exophytic lesion of the left interpolar kidney. (series 301, image 33). Moderate bilateral perinephric stranding is nonspecific. No urinary stone disease or hydronephrosis. Normal bladder. Stomach/Bowel: Stomach is within normal limits. Appendix appears normal. No evidence of bowel wall thickening, distention, or inflammatory changes. Vascular/Lymphatic: Aortic atherosclerosis. No enlarged abdominal or pelvic lymph nodes. Reproductive: Prostate is unremarkable. Other: No abdominal wall hernia or abnormality. No abdominopelvic ascites. Musculoskeletal: No  fracture is seen. IMPRESSION: 1. Motion artifact at level of the kidneys partially obscures renal parenchyma. No hydronephrosis or urinary stone disease. 2. Moderate bilateral perinephric stranding of uncertain significance, possibly related to the kidney injury or infection of the renal collecting system. 3. 13 mm left kidney interpolar exophytic motion degraded indeterminate lesion, possibly hemorrhagic cyst. Consider renal ultrasound for further characterization. 4. Aortic atherosclerosis. 5. Hepatic steatosis. Electronically Signed   By: Kristine Garbe M.D.   On: 10/16/2016 14:33    Procedures Procedures (including critical care time)  Medications Ordered in ED Medications  losartan (COZAAR) tablet 50 mg (not administered)  gabapentin (NEURONTIN) capsule 100-300 mg (not administered)  Vitamin D (Ergocalciferol) (DRISDOL) capsule 50,000 Units (not administered)  0.9 %  sodium chloride infusion (not administered)  acetaminophen (TYLENOL) tablet 650 mg (not administered)    Or  acetaminophen (TYLENOL) suppository 650 mg (not administered)  HYDROcodone-acetaminophen (NORCO/VICODIN) 5-325 MG per tablet 1-2 tablet (not administered)  senna-docusate (Senokot-S) tablet 1 tablet (not administered)  bisacodyl (DULCOLAX) suppository 10 mg (not administered)  magnesium citrate solution 1 Bottle (not administered)  ondansetron (ZOFRAN) tablet 4 mg (not administered)    Or  ondansetron (ZOFRAN) injection 4 mg (not administered)  guaiFENesin (MUCINEX) 12 hr tablet 600 mg (not administered)  heparin injection 5,000 Units (not administered)  cefTRIAXone (ROCEPHIN) 1 g in dextrose 5 % 50 mL IVPB (not administered)  sodium chloride 0.9 % bolus 1,000 mL (0 mLs Intravenous Stopped 10/16/16 1610)  ondansetron (ZOFRAN) injection 4 mg (4 mg Intravenous Given 10/16/16 1250)  morphine 4 MG/ML injection 4 mg (4 mg Intravenous Given 10/16/16 1250)     Initial Impression / Assessment and Plan / ED  Course  I have reviewed the triage vital signs and the nursing notes.  Pertinent labs & imaging results that were available during my care of the patient were reviewed by me and considered in my medical decision making (see chart for details).    Stephen Bowers is a 56 y.o. male who presents to ED for left-sided abdominal abdominal pain x 3 days. Afebrile, hemodynamically stable with tenderness to palpation of left-sided abdomen. Initial CMP hemolyzed. Repeat chem-8 with normal K+. Na  124. Creatinine of 2 and BUN 23. Patient had normal Cr 0.95 in December 2017. Patient has never been told he has poor kidney function in the past. UA with 30 protein and 5 ketones - no signs of infection. CT abd/pelvis reviewed:   IMPRESSION: 1. Motion artifact at level of the kidneys partially obscures renal parenchyma. No hydronephrosis or urinary stone disease. 2. Moderate bilateral perinephric stranding of uncertain significance, possibly related to the kidney injury or infection of the renal collecting system. 3. 13 mm left kidney interpolar exophytic motion degraded indeterminate lesion, possibly hemorrhagic cyst. Consider renal ultrasound for further characterization. 4. Aortic atherosclerosis. 5. Hepatic steatosis.  Consulted hospitalist for admission given acute renal insufficiency. Will likely need renal ultrasound for further evaluation.   Consulted nephrology at hospitalist request. Spoke with Dr. Hollie Salk. Nephrology will see patient in consultation.   Final Clinical Impressions(s) / ED Diagnoses   Final diagnoses:  Left sided abdominal pain  Abdominal pain  AKI (acute kidney injury) [facility])    New Prescriptions New Prescriptions   No medications on file     [facility] Evyn Kooyman, PA-C 10/16/16 1644    Charlesetta Shanks, MD 10/17/16 1718

## 2016-10-16 NOTE — ED Notes (Signed)
First sample CMP was hemolyzied

## 2016-10-16 NOTE — ED Notes (Signed)
Lab resulted sample that was hemolyzed; RN called and spoke to lab to clarify that RN just sent a new sample and that one needs to be run now. RN updated Stephen Bowers, EDP

## 2016-10-16 NOTE — ED Notes (Signed)
Patient transported to Ultrasound 

## 2016-10-16 NOTE — H&P (Signed)
History and Physical    Stephen Bowers WUJ:[id number] DOB: 01-Dec-1960 DOA: 10/16/2016   PCP: Minerva Ends, MD   Patient coming from:  Home    Chief Complaint: Abdominal pain   HPI: Stephen Bowers is a 56 y.o. male with medical history significant for HTN, presenting with 3-4 day history of moderate to severe LUQ pain, non radiating No alleviating factors. Associated with  Intermittent nausea and 3 episodes of non bloody emesis one day prior No fevers. Able to pass gas. Denies constipation, denies diarrhea. Denies blood in the stools. Denies shortness of breath or cough. Recent trip to [place] about 6 weeks ago. No food poisoning. Eats salt rich foods. No changes in medication or over the counter products. 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 Denies a history of hepatitis or prior renal disease. +Fam hx renal disease in his mother He does not drink ETOH or recreational drug use.  Appetite is normal. Denies any dysuria or hematuria. Denies abnormal skin rashes,  Denies any bleeding issues such as epistaxis, hematemesis Denies lower extremity swelling. No confusion    ED Course:  BP 101/73   Pulse 85   Temp 97.8 F (36.6 C) (Oral)   Resp 16   Ht 6' (1.829 m)   Wt 85.3 kg (188 lb)   SpO2 99%   BMI 25.50 kg/m    Urine amber, + protein 30 CMET results are pending as prior were hemolyzed. Most recent labs showed Cr 2.35 now at 2 (was normal at 0.95 in Dec 2017)  GFR in the 30s.  Hb 13.3  Lactic acid 1.04   Review of Systems: As per HPI otherwise 10 point review of systems negative.   Past Medical History:  Diagnosis Date  . Chronic musculoskeletal pain   . High cholesterol Dx 2011  . Hypertension     No past surgical history on file.  Social History Social History   Social History  . Marital status: Married    Spouse name: N/A  . Number of children: 1   . Years of education: 58    Occupational History  . Unemployed     Social  History Main Topics  . Smoking status: Former Smoker    Types: Cigarettes  . Smokeless tobacco: Never Used  . Alcohol use 2.4 oz/week    4 Cans of beer per week     Comment: weekends, 4/week  . Drug use: No  . Sexual activity: Yes   Other Topics Concern  . Not on file   Social History Narrative   Lives in an [place], family house.    Child [facility] to [facility] in [place], 74 yo F,            No Known Allergies  Family History  Problem Relation Age of Onset  . Liver disease Father   . Alcohol abuse Father   . Diabetes Sister   . Obesity Sister   . Cancer Mother     breast   . Kidney disease Mother   . Colon cancer Neg Hx   . Esophageal cancer Neg Hx   . Rectal cancer Neg Hx   . Stomach cancer Neg Hx       Prior to Admission medications   Medication Sig Start Date End Date Taking? Authorizing Provider  B Complex-C (SUPER B COMPLEX PO) Take 1 tablet by mouth.    Historical Provider, MD  gabapentin (NEURONTIN) 100 MG capsule  Take 1-3 capsules (100-300 mg total) by mouth at bedtime. 06/12/16   Josalyn Funches, MD  ketotifen (ZADITOR) 0.025 % ophthalmic solution Place 1 drop into both eyes 2 (two) times daily. 08/02/15   Josalyn Funches, MD  losartan (COZAAR) 50 MG tablet Take 1 tablet (50 mg total) by mouth daily. 06/30/16   Josalyn Funches, MD  naproxen (NAPROSYN) 500 MG tablet Take 1 tablet (500 mg total) by mouth daily with supper. 06/12/16   Josalyn Funches, MD  Vitamin D, Ergocalciferol, (DRISDOL) 50000 units CAPS capsule Take 1 capsule (50,000 Units total) by mouth every 30 (thirty) days. For 8 weeks 06/14/16   Boykin Nearing, MD    Physical Exam:  Vitals:   10/16/16 1345 10/16/16 1600 10/16/16 1615 10/16/16 1630  BP: 100/73 107/87 105/72 101/73  Pulse: 85 87 84 85  Resp:      Temp:      TempSrc:      SpO2: 99% 100% 100% 99%  Weight:      Height:       Constitutional: NAD, calm, comfortable   Eyes: PERRL, lids and conjunctivae normal ENMT: Mucous membranes  are moist, without exudate or lesions  Neck: normal, supple, no masses, no thyromegaly Respiratory: clear to auscultation bilaterally, no wheezing, no crackles. Normal respiratory effort  Cardiovascular: Regular rate and rhythm, no murmurs, rubs or gallops. No extremity edema. 2+ pedal pulses. No carotid bruits.  Abdomen: Soft, LUQ tenderness without guarding  No hepatosplenomegaly. Bowel sounds positive.  Musculoskeletal: no clubbing / cyanosis. Moves all extremities Skin: no jaundice, No lesions.  Neurologic:Known neuropathy in all extremities  Strength equal in all extremities Psychiatric:   Alert and oriented x 3. Normal mood.     Labs on Admission: I have personally reviewed following labs and imaging studies  CBC:  Recent Labs Lab 10/16/16 1106 10/16/16 1338  WBC 10.3  --   NEUTROABS 7.9*  --   HGB 15.4 13.3  HCT 43.3 39.0  MCV 98.0  --   PLT 102*  --     Basic Metabolic Panel:  Recent Labs Lab 10/16/16 1159 10/16/16 1239 10/16/16 1338  NA 129* 129* 134*  K 6.2* 5.4* 4.7  CL 96* 95* 104  CO2 15* 16*  --   GLUCOSE 101* 97 94  BUN 18 19 23*  CREATININE 2.37* 2.35* 2.00*  CALCIUM 8.7* 8.5*  --     GFR: Estimated Creatinine Clearance: 46.3 mL/min (A) (by C-G formula based on SCr of 2 mg/dL (H)).  Liver Function Tests:  Recent Labs Lab 10/16/16 1159  AST 148*  ALT 65*  ALKPHOS 58  BILITOT 3.5*  PROT RESULTS UNAVAILABLE DUE TO INTERFERING SUBSTANCE  ALBUMIN 3.5   No results for input(s): LIPASE, AMYLASE in the last 168 hours. No results for input(s): AMMONIA in the last 168 hours.  Coagulation Profile:  Recent Labs Lab 10/16/16 1106  INR 1.06    Cardiac Enzymes: No results for input(s): CKTOTAL, CKMB, CKMBINDEX, TROPONINI in the last 168 hours.  BNP (last 3 results) No results for input(s): PROBNP in the last 8760 hours.  HbA1C: No results for input(s): HGBA1C in the last 72 hours.  CBG: No results for input(s): GLUCAP in the last 168  hours.  Lipid Profile: No results for input(s): CHOL, HDL, LDLCALC, TRIG, CHOLHDL, LDLDIRECT in the last 72 hours.  Thyroid Function Tests: No results for input(s): TSH, T4TOTAL, FREET4, T3FREE, THYROIDAB in the last 72 hours.  Anemia Panel: No results for input(s): VITAMINB12, FOLATE,  FERRITIN, TIBC, IRON, RETICCTPCT in the last 72 hours.  Urine analysis:    Component Value Date/Time   COLORURINE AMBER (A) 10/16/2016 1242   APPEARANCEUR HAZY (A) 10/16/2016 1242   LABSPEC 1.018 10/16/2016 1242   PHURINE 5.0 10/16/2016 1242   GLUCOSEU NEGATIVE 10/16/2016 1242   HGBUR NEGATIVE 10/16/2016 1242   BILIRUBINUR NEGATIVE 10/16/2016 1242   BILIRUBINUR neg 08/02/2015 1039   KETONESUR 5 (A) 10/16/2016 1242   PROTEINUR 30 (A) 10/16/2016 1242   UROBILINOGEN 0.2 08/02/2015 1039   UROBILINOGEN 0.2 06/19/2014 1556   NITRITE NEGATIVE 10/16/2016 1242   LEUKOCYTESUR NEGATIVE 10/16/2016 1242    Sepsis Labs: @LABRCNTIP (procalcitonin:4,lacticidven:4) )No results found for this or any previous visit (from the past 240 hour(s)).   Radiological Exams on Admission: Ct Abdomen Pelvis Wo Contrast  Result Date: 10/16/2016 CLINICAL DATA:  56 y/o M; left lower quadrant pain radiating to the left flank. EXAM: CT ABDOMEN AND PELVIS WITHOUT CONTRAST TECHNIQUE: Multidetector CT imaging of the abdomen and pelvis was performed following the standard protocol without IV contrast. COMPARISON:  06/19/2014 abdominal ultrasound FINDINGS: Lower chest: No acute abnormality. Hepatobiliary: Well-circumscribed 13 mm low-attenuation focus within segment 4B each likely representing a cyst. Hepatic steatosis. Normal gallbladder. No intra or extrahepatic biliary ductal dilatation. Pancreas: Unremarkable. No pancreatic ductal dilatation or surrounding inflammatory changes. Spleen: Normal in size without focal abnormality. Adrenals/Urinary Tract: Mild motion artifact through the kidneys partially obscures renal parenchyma. Left  kidney interpolar cortical defect with fat attenuation probably representing scarring. 13 mm intermediate attenuation well-circumscribed exophytic lesion of the left interpolar kidney. (series 301, image 33). Moderate bilateral perinephric stranding is nonspecific. No urinary stone disease or hydronephrosis. Normal bladder. Stomach/Bowel: Stomach is within normal limits. Appendix appears normal. No evidence of bowel wall thickening, distention, or inflammatory changes. Vascular/Lymphatic: Aortic atherosclerosis. No enlarged abdominal or pelvic lymph nodes. Reproductive: Prostate is unremarkable. Other: No abdominal wall hernia or abnormality. No abdominopelvic ascites. Musculoskeletal: No fracture is seen. IMPRESSION: 1. Motion artifact at level of the kidneys partially obscures renal parenchyma. No hydronephrosis or urinary stone disease. 2. Moderate bilateral perinephric stranding of uncertain significance, possibly related to the kidney injury or infection of the renal collecting system. 3. 13 mm left kidney interpolar exophytic motion degraded indeterminate lesion, possibly hemorrhagic cyst. Consider renal ultrasound for further characterization. 4. Aortic atherosclerosis. 5. Hepatic steatosis. Electronically Signed   By: Kristine Garbe M.D.   On: 10/16/2016 14:33    EKG: Independently reviewed.  Assessment/Plan Active Problems:   Low back pain potentially associated with radiculopathy   HTN (hypertension)   Vitamin D insufficiency   Acute renal failure (ARF) (HCC)   Hyperlipidemia   Elevated serum creatinine   Abdominal pain  Acute LUQ pain, complicated with elevated creatinine, unclear etiology, which may include infection, acute kidney injury versus renal failure, versus parenchymal abnormalities.  CT abdomen and pelvis  Negative for  hydronephrosis or urinary stone disease.  Moderate bilateral perinephric stranding seen. A 13 mm left kidney   indeterminate lesion, possibly hemorrhagic  cyst was noted .Renal ultrasound for further characterization was ordered CMET pending, as prior labs were hemolyzed  Most recent Cr was Cr 2.35 now at 2 (was normal at 0.95 in Dec 2017)  Lactic acid 1.07  GFR in the 30s. Received IVF, 1 L at the ED. Afebrile. WBC normal.  Admit to medsurg obs Renal Utrasound as above Renal consult requested.  Urine and blood cultures Mg and Phosphorus Rocephin IV  SPEP/UPEP  C3/C4, ANA , Hep  panel pending  IVF   Hypertension BP 101/73   Pulse 85    Controlled  Will hold Cozaar due to acute kidney injury  Hydralazine PRN    Chronic low back pain with neuropathy. No acute issues  Continue Neurontin   Thrombocytopenia, plt 102, this is being verified as plrior blood drawn was hemolized . No ETOH history. LFTs and Hep panel pending  No transfusion is indicated at this time No anticoagulation indicated until repeat labs confirm values    DVT prophylaxis:  SCDs  Code Status:   Full    Family Communication:  Discussed with patient Disposition Plan: Expect patient to be discharged to home after condition improves Consults called:    Renal  Admission status:  [place], Kaytlynne Neace J, PA-C [facility]   10/16/2016, 5:39 PM     Attending MD note  Patient was seen, examined,treatment plan was discussed with the  Advance Practice Provider.  I have personally reviewed the clinical findings, lab,EKG, imaging studies and management of this patient in detail.I have also reviewed the orders written for this patient which were under my direction. I agree with the documentation, as recorded by the Advance Practice Provider.   Stephen Bowers is a 56 y.o. male who presents with abdominal pain found to have AKI of unknown etiology. Appreciate Nephrology insight. Will start CTX empirically in case of infectious component not initially seen w/ UA and unclear on CT.     Cortney Beissel, [place] Congress, MD Family Medicine See Amion for pager # [phone]

## 2016-10-16 NOTE — ED Notes (Signed)
Patient transported to CT 

## 2016-10-17 ENCOUNTER — Encounter (HOSPITAL_COMMUNITY): Payer: Self-pay

## 2016-10-17 LAB — CBC
HEMATOCRIT: 32.6 % — AB (ref 39.0–52.0)
HEMOGLOBIN: 11.1 g/dL — AB (ref 13.0–17.0)
MCH: 33.5 pg (ref 26.0–34.0)
MCHC: 34 g/dL (ref 30.0–36.0)
MCV: 98.5 fL (ref 78.0–100.0)
Platelets: 97 10*3/uL — ABNORMAL LOW (ref 150–400)
RBC: 3.31 MIL/uL — AB (ref 4.22–5.81)
RDW: 13.7 % (ref 11.5–15.5)
WBC: 7.4 10*3/uL (ref 4.0–10.5)

## 2016-10-17 LAB — PROTEIN ELECTROPHORESIS, SERUM
A/G RATIO SPE: 0.7 (ref 0.7–1.7)
Albumin ELP: 3.2 g/dL (ref 2.9–4.4)
Alpha-1-Globulin: 0.4 g/dL (ref 0.0–0.4)
Alpha-2-Globulin: 0.9 g/dL (ref 0.4–1.0)
Beta Globulin: 1.5 g/dL — ABNORMAL HIGH (ref 0.7–1.3)
GLOBULIN, TOTAL: 4.7 g/dL — AB (ref 2.2–3.9)
Gamma Globulin: 1.9 g/dL — ABNORMAL HIGH (ref 0.4–1.8)
TOTAL PROTEIN ELP: 7.9 g/dL (ref 6.0–8.5)

## 2016-10-17 LAB — DIC (DISSEMINATED INTRAVASCULAR COAGULATION) PANEL
APTT: 30 s (ref 24–36)
D DIMER QUANT: 3.07 ug{FEU}/mL — AB (ref 0.00–0.50)
FIBRINOGEN: 693 mg/dL — AB (ref 210–475)
PLATELETS: 107 10*3/uL — AB (ref 150–400)
PROTHROMBIN TIME: 12.4 s (ref 11.4–15.2)

## 2016-10-17 LAB — COMPREHENSIVE METABOLIC PANEL
ALBUMIN: 2.5 g/dL — AB (ref 3.5–5.0)
ALT: 42 U/L (ref 17–63)
ANION GAP: 10 (ref 5–15)
AST: 72 U/L — AB (ref 15–41)
Alkaline Phosphatase: 49 U/L (ref 38–126)
BUN: 22 mg/dL — ABNORMAL HIGH (ref 6–20)
CHLORIDE: 101 mmol/L (ref 101–111)
CO2: 22 mmol/L (ref 22–32)
Calcium: 7.6 mg/dL — ABNORMAL LOW (ref 8.9–10.3)
Creatinine, Ser: 2.67 mg/dL — ABNORMAL HIGH (ref 0.61–1.24)
GFR calc Af Amer: 30 mL/min — ABNORMAL LOW (ref >60)
GFR, EST NON AFRICAN AMERICAN: 25 mL/min — AB (ref >60)
Glucose, Bld: 99 mg/dL (ref 65–99)
Potassium: 3.9 mmol/L (ref 3.5–5.1)
Sodium: 133 mmol/L — ABNORMAL LOW (ref 135–145)
Total Bilirubin: 1.2 mg/dL (ref 0.3–1.2)
Total Protein: 6.6 g/dL (ref 6.5–8.1)

## 2016-10-17 LAB — HEPATITIS C ANTIBODY (REFLEX)

## 2016-10-17 LAB — HEPATITIS B SURFACE ANTIGEN: HEP B S AG: NEGATIVE

## 2016-10-17 LAB — DIC (DISSEMINATED INTRAVASCULAR COAGULATION)PANEL
INR: 0.93
Smear Review: NONE SEEN

## 2016-10-17 LAB — IMMUNOFIXATION, URINE

## 2016-10-17 LAB — HIV ANTIBODY (ROUTINE TESTING W REFLEX): HIV SCREEN 4TH GENERATION: NONREACTIVE

## 2016-10-17 LAB — URINE CULTURE: Culture: NO GROWTH

## 2016-10-17 LAB — HCV COMMENT:

## 2016-10-17 LAB — C3 COMPLEMENT: C3 Complement: 194 mg/dL — ABNORMAL HIGH (ref 82–167)

## 2016-10-17 LAB — LACTATE DEHYDROGENASE: LDH: 263 U/L — ABNORMAL HIGH (ref 98–192)

## 2016-10-17 LAB — CK: CK TOTAL: 224 U/L (ref 49–397)

## 2016-10-17 LAB — ANTINUCLEAR ANTIBODIES, IFA: ANTINUCLEAR ANTIBODIES, IFA: NEGATIVE

## 2016-10-17 LAB — C4 COMPLEMENT: COMPLEMENT C4, BODY FLUID: 30 mg/dL (ref 14–44)

## 2016-10-17 NOTE — Consult Note (Signed)
HPI: Stephen Bowers is a 56 y.o. male with history of hypertension who presented to ED with two days of LLQ pain and conistipation and found to to have AKI on admission. Patient denies recent illness or new medication. Denies fever, nausea, vomiting or diarrhea. Denies history of kidney disease. Says his urine color changes frequently due to the multivitamins he takes. He reports taking losartan for his blood pressure religiously. Denies over the counter pain medicine except tylenol. Denies easy bruising or epistaxis. He reports eating and drinking as usual. Denies cough, shortness of breath or epistaxis.  Nephrology was consulted due to his elevated sCr to 2.37. His baseline sCr is about 0.9.   Past Medical History:  Diagnosis Date  . Chronic musculoskeletal pain   . High cholesterol Dx 2011  . Hypertension    History reviewed. No pertinent surgical history. Social History:  reports that he has quit smoking. His smoking use included Cigarettes. He has never used smokeless tobacco. He reports that he drinks about 2.4 oz of alcohol per week . He reports that he does not use drugs. Allergies: No Known Allergies Family History  Problem Relation Age of Onset  . Liver disease Father   . Alcohol abuse Father   . Diabetes Sister   . Obesity Sister   . Cancer Mother     breast   . Kidney disease Mother   . Colon cancer Neg Hx   . Esophageal cancer Neg Hx   . Rectal cancer Neg Hx   . Stomach cancer Neg Hx     ROS:  Review of systems negative except for pertinent positives and negatives in history of present illness above.    Blood pressure 98/69, pulse 74, temperature 98.3 F (36.8 C), temperature source Oral, resp. rate 18, height '6\' 3"'  (1.905 m), weight 166 lb 9.6 oz (75.6 kg), SpO2 100 %.  GEN: lying in bed,  appears well, no apparent distress. Eyes: conjunctiva without injection, sclera anicteric CVS: RRR, nl s1 & s2, no murmurs, no edema RESP: speaks in full sentence, no IWOB,  good air movement bilaterally, CTAB GI: BS present & normal, soft, NTND, no guarding, no rebound, no mass GU: no suprapubic MSK: no focal tenderness or notable swelling SKIN: no apparent skin lesion or jauncice NEURO: alert and oiented appropriately, no gross defecits  PSYCH: euthymic mood with congruent affect Results for orders placed or performed during the hospital encounter of 10/16/16 (from the past 48 hour(s))  CBC with Differential     Status: Abnormal   Collection Time: 10/16/16 11:06 AM  Result Value Ref Range   WBC 10.3 4.0 - 10.5 K/uL   RBC 4.42 4.22 - 5.81 MIL/uL   Hemoglobin 15.4 13.0 - 17.0 g/dL   HCT 43.3 39.0 - 52.0 %   MCV 98.0 78.0 - 100.0 fL   MCH 34.8 (H) 26.0 - 34.0 pg   MCHC 35.6 30.0 - 36.0 g/dL   RDW 13.9 11.5 - 15.5 %   Platelets 102 (L) 150 - 400 K/uL    Comment: PLATELET COUNT CONFIRMED BY SMEAR   Neutrophils Relative % 77 %   Neutro Abs 7.9 (H) 1.7 - 7.7 K/uL   Lymphocytes Relative 18 %   Lymphs Abs 1.8 0.7 - 4.0 K/uL   Monocytes Relative 5 %   Monocytes Absolute 0.5 0.1 - 1.0 K/uL   Eosinophils Relative 0 %   Eosinophils Absolute 0.0 0.0 - 0.7 K/uL   Basophils Relative 0 %   Basophils  Absolute 0.0 0.0 - 0.1 K/uL  Protime-INR     Status: None   Collection Time: 10/16/16 11:06 AM  Result Value Ref Range   Prothrombin Time 13.8 11.4 - 15.2 seconds   INR 1.06   I-Stat CG4 Lactic Acid, ED     Status: None   Collection Time: 10/16/16 11:34 AM  Result Value Ref Range   Lactic Acid, Venous 1.76 0.5 - 1.9 mmol/L  Comprehensive metabolic panel     Status: Abnormal   Collection Time: 10/16/16 11:59 AM  Result Value Ref Range   Sodium 129 (L) 135 - 145 mmol/L   Potassium 6.2 (H) 3.5 - 5.1 mmol/L    Comment: HEMOLYSIS AT THIS LEVEL MAY AFFECT RESULT   Chloride 96 (L) 101 - 111 mmol/L   CO2 15 (L) 22 - 32 mmol/L   Glucose, Bld 101 (H) 65 - 99 mg/dL   BUN 18 6 - 20 mg/dL   Creatinine, Ser 2.37 (H) 0.61 - 1.24 mg/dL   Calcium 8.7 (L) 8.9 - 10.3 mg/dL    Total Protein RESULTS UNAVAILABLE DUE TO INTERFERING SUBSTANCE 6.5 - 8.1 g/dL    Comment: HEMOLYSIS AT THIS LEVEL MAY AFFECT RESULT   Albumin 3.5 3.5 - 5.0 g/dL   AST 148 (H) 15 - 41 U/L   ALT 65 (H) 17 - 63 U/L   Alkaline Phosphatase 58 38 - 126 U/L   Total Bilirubin 3.5 (H) 0.3 - 1.2 mg/dL   GFR calc non Af Amer 29 (L) >60 mL/min   GFR calc Af Amer 34 (L) >60 mL/min    Comment: (NOTE) The eGFR has been calculated using the CKD EPI equation. This calculation has not been validated in all clinical situations. eGFR's persistently <60 mL/min signify possible Chronic Kidney Disease.    Anion gap 18 (H) 5 - 15  Basic metabolic panel     Status: Abnormal   Collection Time: 10/16/16 12:39 PM  Result Value Ref Range   Sodium 129 (L) 135 - 145 mmol/L   Potassium 5.4 (H) 3.5 - 5.1 mmol/L    Comment: HEMOLYSIS AT THIS LEVEL MAY AFFECT RESULT   Chloride 95 (L) 101 - 111 mmol/L   CO2 16 (L) 22 - 32 mmol/L   Glucose, Bld 97 65 - 99 mg/dL   BUN 19 6 - 20 mg/dL   Creatinine, Ser 2.35 (H) 0.61 - 1.24 mg/dL    Comment: REPEATED TO VERIFY   Calcium 8.5 (L) 8.9 - 10.3 mg/dL   GFR calc non Af Amer 30 (L) >60 mL/min   GFR calc Af Amer 34 (L) >60 mL/min    Comment: (NOTE) The eGFR has been calculated using the CKD EPI equation. This calculation has not been validated in all clinical situations. eGFR's persistently <60 mL/min signify possible Chronic Kidney Disease.    Anion gap 18 (H) 5 - 15  Urinalysis, Routine w reflex microscopic     Status: Abnormal   Collection Time: 10/16/16 12:42 PM  Result Value Ref Range   Color, Urine AMBER (A) YELLOW    Comment: BIOCHEMICALS MAY BE AFFECTED BY COLOR   APPearance HAZY (A) CLEAR   Specific Gravity, Urine 1.018 1.005 - 1.030   pH 5.0 5.0 - 8.0   Glucose, UA NEGATIVE NEGATIVE mg/dL   Hgb urine dipstick NEGATIVE NEGATIVE   Bilirubin Urine NEGATIVE NEGATIVE   Ketones, ur 5 (A) NEGATIVE mg/dL   Protein, ur 30 (A) NEGATIVE mg/dL   Nitrite NEGATIVE  NEGATIVE  Leukocytes, UA NEGATIVE NEGATIVE   RBC / HPF 0-5 0 - 5 RBC/hpf   WBC, UA 0-5 0 - 5 WBC/hpf   Bacteria, UA RARE (A) NONE SEEN   Squamous Epithelial / LPF 0-5 (A) NONE SEEN   Hyaline Casts, UA PRESENT   Protein / creatinine ratio, urine     Status: None   Collection Time: 10/16/16 12:42 PM  Result Value Ref Range   Creatinine, Urine 430 mg/dL    Comment: RESULTS CONFIRMED BY MANUAL DILUTION   Total Protein, Urine 33 mg/dL    Comment: NO NORMAL RANGE ESTABLISHED FOR THIS TEST   Protein Creatinine Ratio 0.08 0.00 - 0.15 mg/mg[Cre]  I-stat Chem 8, ED     Status: Abnormal   Collection Time: 10/16/16  1:38 PM  Result Value Ref Range   Sodium 134 (L) 135 - 145 mmol/L   Potassium 4.7 3.5 - 5.1 mmol/L   Chloride 104 101 - 111 mmol/L   BUN 23 (H) 6 - 20 mg/dL   Creatinine, Ser 2.00 (H) 0.61 - 1.24 mg/dL   Glucose, Bld 94 65 - 99 mg/dL   Calcium, Ion 1.01 (L) 1.15 - 1.40 mmol/L   TCO2 19 0 - 100 mmol/L   Hemoglobin 13.3 13.0 - 17.0 g/dL   HCT 39.0 39.0 - 52.0 %  I-Stat CG4 Lactic Acid, ED     Status: None   Collection Time: 10/16/16  1:39 PM  Result Value Ref Range   Lactic Acid, Venous 1.04 0.5 - 1.9 mmol/L  Hepatic function panel     Status: Abnormal   Collection Time: 10/16/16  4:16 PM  Result Value Ref Range   Total Protein 7.8 6.5 - 8.1 g/dL   Albumin 3.1 (L) 3.5 - 5.0 g/dL   AST 104 (H) 15 - 41 U/L   ALT 57 17 - 63 U/L   Alkaline Phosphatase 51 38 - 126 U/L   Total Bilirubin 2.0 (H) 0.3 - 1.2 mg/dL   Bilirubin, Direct 0.5 0.1 - 0.5 mg/dL   Indirect Bilirubin 1.5 (H) 0.3 - 0.9 mg/dL  Culture, blood (Routine X 2) w Reflex to ID Panel     Status: None (Preliminary result)   Collection Time: 10/16/16  5:26 PM  Result Value Ref Range   Specimen Description BLOOD LEFT FOREARM    Special Requests      BOTTLES DRAWN AEROBIC ONLY Blood Culture adequate volume   Culture NO GROWTH < 24 HOURS    Report Status PENDING   Culture, blood (Routine X 2) w Reflex to ID Panel      Status: None (Preliminary result)   Collection Time: 10/16/16  5:38 PM  Result Value Ref Range   Specimen Description BLOOD LEFT ANTECUBITAL    Special Requests      BOTTLES DRAWN AEROBIC ONLY Blood Culture adequate volume   Culture NO GROWTH < 24 HOURS    Report Status PENDING   HIV antibody (Routine Testing)     Status: None   Collection Time: 10/16/16  5:47 PM  Result Value Ref Range   HIV Screen 4th Generation wRfx Non Reactive Non Reactive    Comment: (NOTE) Performed At: [facility] [place], [place] [id number] Lindon Romp MD HE:[id number]   Magnesium     Status: Abnormal   Collection Time: 10/16/16  5:47 PM  Result Value Ref Range   Magnesium 2.5 (H) 1.7 - 2.4 mg/dL  Phosphorus     Status: None   Collection  Time: 10/16/16  5:47 PM  Result Value Ref Range   Phosphorus 4.0 2.5 - 4.6 mg/dL  Antinuclear Antibodies, IFA     Status: None   Collection Time: 10/16/16  5:47 PM  Result Value Ref Range   ANA Ab, IFA Negative     Comment: (NOTE)                                     Negative   <1:80                                     Borderline  1:80                                     Positive   >1:80 Performed At: [facility] [address] [place], [place] [id number] Lindon Romp MD [id number]   C3 complement     Status: Abnormal   Collection Time: 10/16/16  5:47 PM  Result Value Ref Range   C3 Complement 194 (H) 82 - 167 mg/dL    Comment: (NOTE) Performed At: [facility] [address] [place], [place] [id number] Lindon Romp MD OI:[id number]   C4 complement     Status: None   Collection Time: 10/16/16  5:47 PM  Result Value Ref Range   Complement C4, Body Fluid 30 14 - 44 mg/dL    Comment: (NOTE) Performed At: [facility] [place], [place] [id number] Lindon Romp MD CM:[id number]   Hepatitis B surface antigen     Status: None   Collection Time: 10/16/16  5:47 PM  Result Value  Ref Range   Hepatitis B Surface Ag Negative Negative    Comment: (NOTE) Performed At: [facility] [place], [place] [id number] Lindon Romp MD [id number]   Hepatitis c antibody (reflex)     Status: None   Collection Time: 10/16/16  5:47 PM  Result Value Ref Range   HCV Ab <0.1 0.0 - 0.9 s/co ratio    Comment: (NOTE) Performed At: [facility] [place], [place] [id number] Lindon Romp MD EO:[id number]   HCV Comment:     Status: None   Collection Time: 10/16/16  5:47 PM  Result Value Ref Range   Comment: Comment     Comment: (NOTE) Non reactive HCV antibody screen is consistent with no HCV infection, unless recent infection is suspected or other evidence exists to indicate HCV infection. Performed At: [facility] [place], [place] [id number] Lindon Romp MD RA:[id number]   Comprehensive metabolic panel     Status: Abnormal   Collection Time: 10/17/16  4:42 AM  Result Value Ref Range   Sodium 133 (L) 135 - 145 mmol/L   Potassium 3.9 3.5 - 5.1 mmol/L   Chloride 101 101 - 111 mmol/L   CO2 22 22 - 32 mmol/L   Glucose, Bld 99 65 - 99 mg/dL   BUN 22 (H) 6 - 20 mg/dL   Creatinine, Ser 2.67 (H) 0.61 - 1.24 mg/dL   Calcium 7.6 (L) 8.9 - 10.3 mg/dL   Total Protein 6.6 6.5 - 8.1 g/dL   Albumin 2.5 (L) 3.5 - 5.0 g/dL   AST 72 (H) 15 -  41 U/L   ALT 42 17 - 63 U/L   Alkaline Phosphatase 49 38 - 126 U/L   Total Bilirubin 1.2 0.3 - 1.2 mg/dL   GFR calc non Af Amer 25 (L) >60 mL/min   GFR calc Af Amer 30 (L) >60 mL/min    Comment: (NOTE) The eGFR has been calculated using the CKD EPI equation. This calculation has not been validated in all clinical situations. eGFR's persistently <60 mL/min signify possible Chronic Kidney Disease.    Anion gap 10 5 - 15  CBC     Status: Abnormal   Collection Time: 10/17/16  4:42 AM  Result Value Ref Range   WBC 7.4 4.0 - 10.5 K/uL   RBC 3.31 (L) 4.22 - 5.81  MIL/uL   Hemoglobin 11.1 (L) 13.0 - 17.0 g/dL   HCT 32.6 (L) 39.0 - 52.0 %   MCV 98.5 78.0 - 100.0 fL   MCH 33.5 26.0 - 34.0 pg   MCHC 34.0 30.0 - 36.0 g/dL   RDW 13.7 11.5 - 15.5 %   Platelets 97 (L) 150 - 400 K/uL    Comment: CONSISTENT WITH PREVIOUS RESULT  DIC (disseminated intravasc coag) panel     Status: Abnormal   Collection Time: 10/17/16  9:52 AM  Result Value Ref Range   Prothrombin Time 12.4 11.4 - 15.2 seconds   INR 0.93    aPTT 30 24 - 36 seconds   Fibrinogen 693 (H) 210 - 475 mg/dL   D-Dimer, Quant 3.07 (H) 0.00 - 0.50 ug/mL-FEU    Comment: (NOTE) At the manufacturer cut-off of 0.50 ug/mL FEU, this assay has been documented to exclude PE with a sensitivity and negative predictive value of 97 to 99%.  At this time, this assay has not been approved by the FDA to exclude DVT/VTE. Results should be correlated with clinical presentation.    Platelets 107 (L) 150 - 400 K/uL    Comment: CONSISTENT WITH PREVIOUS RESULT   Smear Review NO SCHISTOCYTES SEEN   Lactate dehydrogenase     Status: Abnormal   Collection Time: 10/17/16  9:52 AM  Result Value Ref Range   LDH 263 (H) 98 - 192 U/L  CK     Status: None   Collection Time: 10/17/16  9:52 AM  Result Value Ref Range   Total CK 224 49 - 397 U/L   Ct Abdomen Pelvis Wo Contrast  Result Date: 10/16/2016 CLINICAL DATA:  56 y/o M; left lower quadrant pain radiating to the left flank. EXAM: CT ABDOMEN AND PELVIS WITHOUT CONTRAST TECHNIQUE: Multidetector CT imaging of the abdomen and pelvis was performed following the standard protocol without IV contrast. COMPARISON:  06/19/2014 abdominal ultrasound FINDINGS: Lower chest: No acute abnormality. Hepatobiliary: Well-circumscribed 13 mm low-attenuation focus within segment 4B each likely representing a cyst. Hepatic steatosis. Normal gallbladder. No intra or extrahepatic biliary ductal dilatation. Pancreas: Unremarkable. No pancreatic ductal dilatation or surrounding inflammatory  changes. Spleen: Normal in size without focal abnormality. Adrenals/Urinary Tract: Mild motion artifact through the kidneys partially obscures renal parenchyma. Left kidney interpolar cortical defect with fat attenuation probably representing scarring. 13 mm intermediate attenuation well-circumscribed exophytic lesion of the left interpolar kidney. (series 301, image 33). Moderate bilateral perinephric stranding is nonspecific. No urinary stone disease or hydronephrosis. Normal bladder. Stomach/Bowel: Stomach is within normal limits. Appendix appears normal. No evidence of bowel wall thickening, distention, or inflammatory changes. Vascular/Lymphatic: Aortic atherosclerosis. No enlarged abdominal or pelvic lymph nodes. Reproductive: Prostate is unremarkable. Other: No abdominal  wall hernia or abnormality. No abdominopelvic ascites. Musculoskeletal: No fracture is seen. IMPRESSION: 1. Motion artifact at level of the kidneys partially obscures renal parenchyma. No hydronephrosis or urinary stone disease. 2. Moderate bilateral perinephric stranding of uncertain significance, possibly related to the kidney injury or infection of the renal collecting system. 3. 13 mm left kidney interpolar exophytic motion degraded indeterminate lesion, possibly hemorrhagic cyst. Consider renal ultrasound for further characterization. 4. Aortic atherosclerosis. 5. Hepatic steatosis. Electronically Signed   By: Kristine Garbe M.D.   On: 10/16/2016 14:33   US Renal  Result Date: 10/16/2016 CLINICAL DATA:  Acute renal failure. Exophytic left renal lesion on motion degraded CTs earlier today. EXAM: RENAL / URINARY TRACT ULTRASOUND COMPLETE COMPARISON:  CT scan 10/16/2016 FINDINGS: Right Kidney: Length: 13.3 cm.  No hydronephrosis. Left Kidney: Length: 12.1 cm. No hydronephrosis. Exophytic interpolar left renal lesion not well seen by ultrasound but not definitely cystic. Bladder: Decompressed. IMPRESSION: 11 mm interpolar left  exophytic renal lesion. Ultrasound imaging indeterminate. After resolution of acute symptoms and when the patient can participate in reproducible breath holding, outpatient abdominal MRI without and with contrast may prove helpful to further evaluate. Electronically Signed   By: Misty Stanley M.D.   On: 10/16/2016 17:33    Assessment/plan:  AKI/?CKD: sCr elevated to 2.37 on admission. Baseline 0.9, about 4 months ago. BUN 18. BUN/Cr ratio <20 suggestive for ATN. He has been on losartan for his blood pressure which could be an offending factor. No recent illness. CK within normal limit, so unlikely rhabdo. Urine PCR 0.08 arguing against nephrotic syndrome. Renal US with 11 mm interpolar left exophytic renal lesion.   f/u other autoimmune labs: ANCA & SPEP  Renal biopsy if no improvement in his renal function.   Continue IV NaCl.   Agree holding losartan  Avoid nephrotoxic drugs  Gabapentin dosed renally  Renal panel daily  Strict I&O  Out patient follow up for renal lesion on CT and [place].   f/u urine culture. UA not convincing for UTI. Doubt utility of antibiotics.   Hyperkalemia: resolved. K elevated to 6.2 on admission. Most recent K 3.9. HyperK Likely due to AKI.   Monitor renal function  Hyponatremia: improving. Na 129 on admission, likely beer potomania.   Daily renal function.  Transaminases: AST/ALT 148/65 on admission. This is down trending. Likely alcohol induced in the setting of normal CK. Hepatitis viral panel within normal.  Thrombocytopenia: likely alcohol induced. Alcoholic pattern of AST and ALT elevation. CT abdomen with hepatic steatosis as well.Elevated D-dimer on DIC panel likely due to his AKI vs thrombotic process as fibrinogen is elevated as well.   monitor with daily CBC Keonta Alsip T Deaven Barron 10/17/2016, 1:28 PM

## 2016-10-17 NOTE — Care Management Note (Addendum)
Case Management Note  Patient Details  Name: Stephen Bowers MRN: [id number] Date of Birth: June 13, 1961  Subjective/Objective:              Admitted with HTN, UTI. From home with wife. Pt's PCP is Dr Funches/[facility]. Pt is without insurance. States independent with ADL's, PTA, no DME usage.  Jacub Waiters (Spouse)     [phone]       Action/Plan: Plan is to d/c to home when medically stable. CM to f/u with disposition needs.  Pt's post hospital  f/u appointment scheduled for 4/24/ 2018 @3pm  with Dr. Adrian Blackwater [place]. CM made pt aware.  Expected Discharge Date:                  Expected Discharge Plan:  Home/Self Care  In-House Referral:     Discharge planning Services  CM Consult  Post Acute Care Choice:    Choice offered to:     DME Arranged:    DME Agency:     HH Arranged:    HH Agency:     Status of Service:  In process, will continue to follow  If discussed at Long Length of Stay Meetings, dates discussed:    Additional Comments:  Sharin Mons, RN 10/17/2016, 10:19 AM

## 2016-10-17 NOTE — Progress Notes (Signed)
PROGRESS NOTE    Stephen Bowers  WGY:[id number] DOB: 1960/12/08 DOA: 10/16/2016 PCP: Minerva Ends, MD    Brief Narrative:  56 y.o. male with medical history significant for HTN, presenting with 3-4 day history of moderate to severe LUQ pain, non radiating No alleviating factors. Associated with  Intermittent nausea and 3 episodes of non bloody emesis one day prior No fevers.  Currently undergoing work up for AKI and abdominal discomfort.  Assessment & Plan:   Abdominal pain - CT of abdomen and pelvis for today following : 13 mm left kidney interpolar exophytic motion degraded indeterminate lesion, possibly hemorrhagic cyst. Consider renal ultrasound for further characterization. - currently on Rocephin IV, may consider holding antibiotic soon. There is no clear evidence that patient has uti. - ? Whether this is related to current renal scenario  Active Problems:   Low back pain potentially associated with radiculopathy - Currently well controlled with when necessary medication regimen. Will continue - Ultrasound evaluation of lesion indeterminate as well. Radiologist recommending outpatient MRI evaluation    HTN (hypertension) - BP relatively well controlled off antihypertensive medication.    Vitamin D insufficiency   Acute renal failure (ARF) [facility]) - Nephrology on board and currently patient is undergoing investigation. - continue to monitor BMPs    Hyperlipidemia   Elevated serum creatinine     DVT prophylaxis: scd's Code Status: Full Family Communication: None at bedside Disposition Plan: pending improvement in condition.   Consultants:   Nephrology   Procedures: None   Antimicrobials: Rocephin   Subjective: Pt has no new complaints.  Objective: Vitals:   10/16/16 1838 10/16/16 2154 10/17/16 0535 10/17/16 1501  BP: 113/78 96/69 98/69  110/69  Pulse: 86 87 74 82  Resp: 19 18 18 20   Temp: 98.5 F (36.9 C) 99 F (37.2 C) 98.3 F (36.8 C)  98.7 F (37.1 C)  TempSrc:   Oral   SpO2: 100% 100% 100% 100%  Weight: 85.3 kg (188 lb 0.8 oz)  75.6 kg (166 lb 9.6 oz)   Height: 6\' 3"  (1.905 m)       Intake/Output Summary (Last 24 hours) at 10/17/16 1522 Last data filed at 10/17/16 0400  Gross per 24 hour  Intake             1675 ml  Output                0 ml  Net             1675 ml   Filed Weights   10/16/16 1047 10/16/16 1838 10/17/16 0535  Weight: 85.3 kg (188 lb) 85.3 kg (188 lb 0.8 oz) 75.6 kg (166 lb 9.6 oz)    Examination:  General exam: Appears calm and comfortable, in nad. Respiratory system: Clear to auscultation. Respiratory effort normal. Cardiovascular system: S1 & S2 heard, RRR. No JVD Gastrointestinal system: Abdomen is nondistended, soft and nontender.  Central nervous system: Alert and oriented. No focal neurological deficits. Extremities: Symmetric 5 x 5 power.  Skin: No rashes, lesions or ulcersOn limited exam. Psychiatry:  Mood & affect appropriate.     Data Reviewed: I have personally reviewed following labs and imaging studies  CBC:  Recent Labs Lab 10/16/16 1106 10/16/16 1338 10/17/16 0442 10/17/16 0952  WBC 10.3  --  7.4  --   NEUTROABS 7.9*  --   --   --   HGB 15.4 13.3 11.1*  --   HCT 43.3 39.0 32.6*  --  MCV 98.0  --  98.5  --   PLT 102*  --  97* 644*   Basic Metabolic Panel:  Recent Labs Lab 10/16/16 1159 10/16/16 1239 10/16/16 1338 10/16/16 1747 10/17/16 0442  NA 129* 129* 134*  --  133*  K 6.2* 5.4* 4.7  --  3.9  CL 96* 95* 104  --  101  CO2 15* 16*  --   --  22  GLUCOSE 101* 97 94  --  99  BUN 18 19 23*  --  22*  CREATININE 2.37* 2.35* 2.00*  --  2.67*  CALCIUM 8.7* 8.5*  --   --  7.6*  MG  --   --   --  2.5*  --   PHOS  --   --   --  4.0  --    GFR: Estimated Creatinine Clearance: 33.8 mL/min (A) (by C-G formula based on SCr of 2.67 mg/dL (H)). Liver Function Tests:  Recent Labs Lab 10/16/16 1159 10/16/16 1616 10/17/16 0442  AST 148* 104* 72*  ALT  65* 57 42  ALKPHOS 58 51 49  BILITOT 3.5* 2.0* 1.2  PROT RESULTS UNAVAILABLE DUE TO INTERFERING SUBSTANCE 7.8 6.6  ALBUMIN 3.5 3.1* 2.5*   No results for input(s): LIPASE, AMYLASE in the last 168 hours. No results for input(s): AMMONIA in the last 168 hours. Coagulation Profile:  Recent Labs Lab 10/16/16 1106 10/17/16 0952  INR 1.06 0.93   Cardiac Enzymes:  Recent Labs Lab 10/17/16 0952  CKTOTAL 224   BNP (last 3 results) No results for input(s): PROBNP in the last 8760 hours. HbA1C: No results for input(s): HGBA1C in the last 72 hours. CBG: No results for input(s): GLUCAP in the last 168 hours. Lipid Profile: No results for input(s): CHOL, HDL, LDLCALC, TRIG, CHOLHDL, LDLDIRECT in the last 72 hours. Thyroid Function Tests: No results for input(s): TSH, T4TOTAL, FREET4, T3FREE, THYROIDAB in the last 72 hours. Anemia Panel: No results for input(s): VITAMINB12, FOLATE, FERRITIN, TIBC, IRON, RETICCTPCT in the last 72 hours. Sepsis Labs:  Recent Labs Lab 10/16/16 1134 10/16/16 1339  LATICACIDVEN 1.76 1.04    Recent Results (from the past 240 hour(s))  Culture, Urine     Status: None   Collection Time: 10/16/16 12:42 PM  Result Value Ref Range Status   Specimen Description URINE, CLEAN CATCH  Final   Special Requests NONE  Final   Culture NO GROWTH  Final   Report Status 10/17/2016 FINAL  Final  Culture, blood (Routine X 2) w Reflex to ID Panel     Status: None (Preliminary result)   Collection Time: 10/16/16  5:26 PM  Result Value Ref Range Status   Specimen Description BLOOD LEFT FOREARM  Final   Special Requests   Final    BOTTLES DRAWN AEROBIC ONLY Blood Culture adequate volume   Culture NO GROWTH < 24 HOURS  Final   Report Status PENDING  Incomplete  Culture, blood (Routine X 2) w Reflex to ID Panel     Status: None (Preliminary result)   Collection Time: 10/16/16  5:38 PM  Result Value Ref Range Status   Specimen Description BLOOD LEFT ANTECUBITAL   Final   Special Requests   Final    BOTTLES DRAWN AEROBIC ONLY Blood Culture adequate volume   Culture NO GROWTH < 24 HOURS  Final   Report Status PENDING  Incomplete         Radiology Studies: Ct Abdomen Pelvis Wo Contrast  Result Date: 10/16/2016  CLINICAL DATA:  56 y/o M; left lower quadrant pain radiating to the left flank. EXAM: CT ABDOMEN AND PELVIS WITHOUT CONTRAST TECHNIQUE: Multidetector CT imaging of the abdomen and pelvis was performed following the standard protocol without IV contrast. COMPARISON:  06/19/2014 abdominal ultrasound FINDINGS: Lower chest: No acute abnormality. Hepatobiliary: Well-circumscribed 13 mm low-attenuation focus within segment 4B each likely representing a cyst. Hepatic steatosis. Normal gallbladder. No intra or extrahepatic biliary ductal dilatation. Pancreas: Unremarkable. No pancreatic ductal dilatation or surrounding inflammatory changes. Spleen: Normal in size without focal abnormality. Adrenals/Urinary Tract: Mild motion artifact through the kidneys partially obscures renal parenchyma. Left kidney interpolar cortical defect with fat attenuation probably representing scarring. 13 mm intermediate attenuation well-circumscribed exophytic lesion of the left interpolar kidney. (series 301, image 33). Moderate bilateral perinephric stranding is nonspecific. No urinary stone disease or hydronephrosis. Normal bladder. Stomach/Bowel: Stomach is within normal limits. Appendix appears normal. No evidence of bowel wall thickening, distention, or inflammatory changes. Vascular/Lymphatic: Aortic atherosclerosis. No enlarged abdominal or pelvic lymph nodes. Reproductive: Prostate is unremarkable. Other: No abdominal wall hernia or abnormality. No abdominopelvic ascites. Musculoskeletal: No fracture is seen. IMPRESSION: 1. Motion artifact at level of the kidneys partially obscures renal parenchyma. No hydronephrosis or urinary stone disease. 2. Moderate bilateral perinephric  stranding of uncertain significance, possibly related to the kidney injury or infection of the renal collecting system. 3. 13 mm left kidney interpolar exophytic motion degraded indeterminate lesion, possibly hemorrhagic cyst. Consider renal ultrasound for further characterization. 4. Aortic atherosclerosis. 5. Hepatic steatosis. Electronically Signed   By: Kristine Garbe M.D.   On: 10/16/2016 14:33   US Renal  Result Date: 10/16/2016 CLINICAL DATA:  Acute renal failure. Exophytic left renal lesion on motion degraded CTs earlier today. EXAM: RENAL / URINARY TRACT ULTRASOUND COMPLETE COMPARISON:  CT scan 10/16/2016 FINDINGS: Right Kidney: Length: 13.3 cm.  No hydronephrosis. Left Kidney: Length: 12.1 cm. No hydronephrosis. Exophytic interpolar left renal lesion not well seen by ultrasound but not definitely cystic. Bladder: Decompressed. IMPRESSION: 11 mm interpolar left exophytic renal lesion. Ultrasound imaging indeterminate. After resolution of acute symptoms and when the patient can participate in reproducible breath holding, outpatient abdominal MRI without and with contrast may prove helpful to further evaluate. Electronically Signed   By: Misty Stanley M.D.   On: 10/16/2016 17:33   Scheduled Meds: . gabapentin  300 mg Oral QHS   Continuous Infusions: . sodium chloride 75 mL/hr at 10/16/16 1737  . cefTRIAXone (ROCEPHIN)  IV 1 g (10/16/16 1739)     LOS: 0 days   Time spent: > 35 minutes  Velvet Bathe, MD [facility] Pager [phone]  If 7PM-7AM, please contact night-coverage [email or web address] Password [email or web address] 10/17/2016, 3:22 PM

## 2016-10-18 DIAGNOSIS — D696 Thrombocytopenia, unspecified: Secondary | ICD-10-CM

## 2016-10-18 DIAGNOSIS — D649 Anemia, unspecified: Secondary | ICD-10-CM

## 2016-10-18 LAB — BASIC METABOLIC PANEL
Anion gap: 6 (ref 5–15)
BUN: 22 mg/dL — ABNORMAL HIGH (ref 6–20)
CO2: 22 mmol/L (ref 22–32)
CREATININE: 3.34 mg/dL — AB (ref 0.61–1.24)
Calcium: 7.6 mg/dL — ABNORMAL LOW (ref 8.9–10.3)
Chloride: 102 mmol/L (ref 101–111)
GFR calc Af Amer: 23 mL/min — ABNORMAL LOW (ref >60)
GFR, EST NON AFRICAN AMERICAN: 19 mL/min — AB (ref >60)
GLUCOSE: 111 mg/dL — AB (ref 65–99)
Potassium: 3.5 mmol/L (ref 3.5–5.1)
SODIUM: 130 mmol/L — AB (ref 135–145)

## 2016-10-18 LAB — HAPTOGLOBIN: HAPTOGLOBIN: 148 mg/dL (ref 34–200)

## 2016-10-18 MED ORDER — SODIUM CHLORIDE 0.9 % IV SOLN
INTRAVENOUS | Status: DC
  Administered 2016-10-18: 18:00:00 via INTRAVENOUS

## 2016-10-18 NOTE — Progress Notes (Signed)
[facility] Progress Note    Assessment/ Plan:   AKI/?CKD: sCr rose to 3.34; 2.37 on admission. Baseline 0.9, about 4 months ago. BUN 18-.>22. He has been on losartan for his blood pressure. He also admitted using BC powder about two times a week. CK, autoimmune labs, UA and urine sediments not revealing much. UOP 0.53ml./kg/hr but her reports urinating more.  Renal US with 11 mm interpolar left exophytic renal lesion. Doubt this is contributing to his AKI.   Continue IV NaCl@75 . He has good PO intake as well.  Agree holding losartan  Avoid nephrotoxic drugs  Gabapentin dosed renally  Renal panel daily  Strict I&O  Renal biopsy if no improvement in his renal function.   Out patient follow up for renal lesion on CT and [place].   f/u urine culture. UA not convincing for UTI. Agree discontinuing Antibiotics.   Hyperkalemia: resolved.    Monitor renal function  Hyponatremia: improving. Na 130.  Continue IVF as above  Daily renal function.  Transaminases: improved. AST/ALT 148/65-->42/42.. Likely alcohol induced in the setting of normal CK. Hepatitis viral panel within normal.  Thrombocytopenia: likely alcohol induced. Alcoholic pattern of AST and ALT elevation. CT abdomen with hepatic steatosis as well.Elevated D-dimer on DIC panel likely due to his AKI vs thrombotic process as fibrinogen is elevated as well.   Check CBC  Subjective:   No complaint this morning. Feels well. He was standing and shaving. Wife at bedside. Question answered.    Objective:   BP 105/74 (BP Location: Left Arm)   Pulse 74   Temp 98.5 F (36.9 C)   Resp 18   Ht 6\' 3"  (1.905 m)   Wt 166 lb 9.6 oz (75.6 kg)   SpO2 100%   BMI 20.82 kg/m   Intake/Output Summary (Last 24 hours) at 10/18/16 0938 Last data filed at 10/17/16 2158  Gross per 24 hour  Intake           1212.5 ml  Output              275 ml  Net            937.5 ml   Weight change:   Physical Exam: GEN: up by  water faucet shaving.  CVS: RRR, nl s1 & s2, no murmurs, no edema RESP: speaks in full sentence, no IWOB GI: BS present & normal, soft, NTND, no guarding, no rebound, no mass MSK: no focal tenderness or notable swelling SKIN: no apparent skin lesion or jauncice NEURO: alert and oiented appropriately, no gross defecits  PSYCH: euthymic mood with congruent affect Imaging: Ct Abdomen Pelvis Wo Contrast  Result Date: 10/16/2016 CLINICAL DATA:  56 y/o M; left lower quadrant pain radiating to the left flank. EXAM: CT ABDOMEN AND PELVIS WITHOUT CONTRAST TECHNIQUE: Multidetector CT imaging of the abdomen and pelvis was performed following the standard protocol without IV contrast. COMPARISON:  06/19/2014 abdominal ultrasound FINDINGS: Lower chest: No acute abnormality. Hepatobiliary: Well-circumscribed 13 mm low-attenuation focus within segment 4B each likely representing a cyst. Hepatic steatosis. Normal gallbladder. No intra or extrahepatic biliary ductal dilatation. Pancreas: Unremarkable. No pancreatic ductal dilatation or surrounding inflammatory changes. Spleen: Normal in size without focal abnormality. Adrenals/Urinary Tract: Mild motion artifact through the kidneys partially obscures renal parenchyma. Left kidney interpolar cortical defect with fat attenuation probably representing scarring. 13 mm intermediate attenuation well-circumscribed exophytic lesion of the left interpolar kidney. (series 301, image 33). Moderate bilateral perinephric stranding is nonspecific. No urinary stone disease  or hydronephrosis. Normal bladder. Stomach/Bowel: Stomach is within normal limits. Appendix appears normal. No evidence of bowel wall thickening, distention, or inflammatory changes. Vascular/Lymphatic: Aortic atherosclerosis. No enlarged abdominal or pelvic lymph nodes. Reproductive: Prostate is unremarkable. Other: No abdominal wall hernia or abnormality. No abdominopelvic ascites. Musculoskeletal: No fracture is  seen. IMPRESSION: 1. Motion artifact at level of the kidneys partially obscures renal parenchyma. No hydronephrosis or urinary stone disease. 2. Moderate bilateral perinephric stranding of uncertain significance, possibly related to the kidney injury or infection of the renal collecting system. 3. 13 mm left kidney interpolar exophytic motion degraded indeterminate lesion, possibly hemorrhagic cyst. Consider renal ultrasound for further characterization. 4. Aortic atherosclerosis. 5. Hepatic steatosis. Electronically Signed   By: Kristine Garbe M.D.   On: 10/16/2016 14:33   US Renal  Result Date: 10/16/2016 CLINICAL DATA:  Acute renal failure. Exophytic left renal lesion on motion degraded CTs earlier today. EXAM: RENAL / URINARY TRACT ULTRASOUND COMPLETE COMPARISON:  CT scan 10/16/2016 FINDINGS: Right Kidney: Length: 13.3 cm.  No hydronephrosis. Left Kidney: Length: 12.1 cm. No hydronephrosis. Exophytic interpolar left renal lesion not well seen by ultrasound but not definitely cystic. Bladder: Decompressed. IMPRESSION: 11 mm interpolar left exophytic renal lesion. Ultrasound imaging indeterminate. After resolution of acute symptoms and when the patient can participate in reproducible breath holding, outpatient abdominal MRI without and with contrast may prove helpful to further evaluate. Electronically Signed   By: Misty Stanley M.D.   On: 10/16/2016 17:33    Labs: BMET  Recent Labs Lab 10/16/16 1159 10/16/16 1239 10/16/16 1338 10/16/16 1747 10/17/16 0442 10/18/16 0514  NA 129* 129* 134*  --  133* 130*  K 6.2* 5.4* 4.7  --  3.9 3.5  CL 96* 95* 104  --  101 102  CO2 15* 16*  --   --  22 22  GLUCOSE 101* 97 94  --  99 111*  BUN 18 19 23*  --  22* 22*  CREATININE 2.37* 2.35* 2.00*  --  2.67* 3.34*  CALCIUM 8.7* 8.5*  --   --  7.6* 7.6*  PHOS  --   --   --  4.0  --   --    CBC  Recent Labs Lab 10/16/16 1106 10/16/16 1338 10/17/16 0442 10/17/16 0952  WBC 10.3  --  7.4  --    NEUTROABS 7.9*  --   --   --   HGB 15.4 13.3 11.1*  --   HCT 43.3 39.0 32.6*  --   MCV 98.0  --  98.5  --   PLT 102*  --  97* 107*    Medications:    . gabapentin  300 mg Oral QHS      Wendee Beavers, MD.  PGY-2 Pager [phone] 10/18/16  9:38 AM

## 2016-10-18 NOTE — Progress Notes (Signed)
PROGRESS NOTE   Stephen Bowers  RWE:[id number]    DOB: 1961/04/28    DOA: 10/16/2016  PCP: Minerva Ends, MD   I have briefly reviewed patients previous medical records in [facility].  Brief Narrative:  56 year old male with PMH of HTN, HLD, works moving heavy objects daily, presented with 3-4 days history of gradual onset of moderate to severe LUQ pain without radiation, ? worse with movement, mild nausea and self-limited 3 episodes of nonbloody emesis without fever or chills, dysuria, urinary frequency, decreased urine output or change in urinary color. Takes NSAIDs/Naprosyn for neuropathy and twice-weekly BC powder. Lab work consistent with acute kidney injury. Etiology of LUQ pain unclear but resolved. Nephrology consulting.   Assessment & Plan:   Active Problems:   Low back pain potentially associated with radiculopathy   HTN (hypertension)   Vitamin D insufficiency   Acute renal failure (ARF) (HCC)   Hyperlipidemia   Elevated serum creatinine   Abdominal pain   1. LUQ abdominal pain: Urine microscopy and symptomatology not consistent with UTI. Urine culture negative. CT abdomen and pelvis without contrast 4/16: Motion artifact at level of kidneys partially obscures renal parenchyma but no hydronephrosis or urinary stone disease. Moderate bilateral perinephric stranding of unclear significance. 13 mm left kidney interpolar exophytic motion degraded indeterminate lesion, possibly hemorrhagic cyst. Abdominal pain resolved.? Related to acute kidney injury.? Musculoskeletal pain related to occupation. Rest incidental findings. 2. Left exophytic renal lesion: On CT abdomen: 13 mm left kidney interpolar exophytic lesion, possibly hemorrhagic cyst. On renal ultrasound, 11 mm interpolar left exophytic renal lesion and indeterminate. Outpatient abdominal MRI without and with contrast recommended. 3. Acute kidney injury: Normal creatinine of 0.95 in December 2017. Now presented with  creatinine of 2.37 which has increased to 3.34. Urine microscopy shows hyaline casts. No hydronephrosis by imaging. CK and haptoglobin normal. LDH mildly elevated. HCV antibody and hepatitis B surface antigen negative. C3 elevated. C4 normal. ANA negative. SPEP: Polyclonal increase in Her globulin. UPEP: Pending. HIV antibodies: Nonreactive. Blood cultures 2: Negative. Urine culture: Negative. Discontinued ceftriaxone.? Related to ARB/NSAIDs-held. IV fluids. No hydronephrosis. Nephrology consultation and follow-up appreciated and considering renal biopsy if no improvement. Follow BMP. 4. Hyperkalemia: Resolved. 5. Hyponatremia: Clinically euvolemic.? Related to acute kidney injury. Stable. 6. Essential hypertension: Controlled. 7. Anemia: Follow CBCs. 8. Thrombocytopenia: Unclear history. No alcohol history. HCV antibody and hepatitis B surface antigen negative. Transient bilirubin elevation, resolved and mild AST elevation of unclear significance. Stable. No schistocytes on smear. Not suggestive of hemolysis. Fibrinogen elevated. Coags normal. 9. Chronic low back pain with neuropathy: Continue Neurontin. 10. Fatty liver: Seen on imaging.   DVT prophylaxis: SCDs Code Status: Full Family Communication: None at bedside Disposition: DC home when medically improved   Consultants:  Nephrology   Procedures:  None  Antimicrobials:  IV ceftriaxone-discontinued    Subjective: States no abdominal pain for greater than 48 hours. No recurrence of nausea and vomiting in the hospital. Tolerating diet. Good urine output. Denies any other complaints.   ROS: No urinary frequency, dysuria or change in normal urinary bladder. Volunteers to taking BC powder twice weekly. States that he lifts and moves heavy objects at his workplace and may have sprained his left side and has happened similarly in the past.  Objective:  Vitals:   10/18/16 0244 10/18/16 0516 10/18/16 0900 10/18/16 1354  BP: 120/78  105/74 116/78 118/77  Pulse: 78 74 84 78  Resp:  18 18 20   Temp: 97.5 F (  36.4 C) 98.5 F (36.9 C) 97.5 F (36.4 C) 99.8 F (37.7 C)  TempSrc: Oral  Oral Oral  SpO2: 100% 100% 99% 100%  Weight:      Height:        Examination:  General exam: Pleasant middle-aged male sitting up comfortably in bed. Respiratory system: Clear to auscultation. Respiratory effort normal. Cardiovascular system: S1 & S2 heard, RRR. No JVD, murmurs, rubs, gallops or clicks. No pedal edema. Gastrointestinal system: Abdomen is nondistended, soft and nontender. No organomegaly or masses felt. Normal bowel sounds heard. Central nervous system: Alert and oriented. No focal neurological deficits. Extremities: Symmetric 5 x 5 power. Skin: No rashes, lesions or ulcers Psychiatry: Judgement and insight appear normal. Mood & affect appropriate.     Data Reviewed: I have personally reviewed following labs and imaging studies  CBC:  Recent Labs Lab 10/16/16 1106 10/16/16 1338 10/17/16 0442 10/17/16 0952  WBC 10.3  --  7.4  --   NEUTROABS 7.9*  --   --   --   HGB 15.4 13.3 11.1*  --   HCT 43.3 39.0 32.6*  --   MCV 98.0  --  98.5  --   PLT 102*  --  97* 401*   Basic Metabolic Panel:  Recent Labs Lab 10/16/16 1159 10/16/16 1239 10/16/16 1338 10/16/16 1747 10/17/16 0442 10/18/16 0514  NA 129* 129* 134*  --  133* 130*  K 6.2* 5.4* 4.7  --  3.9 3.5  CL 96* 95* 104  --  101 102  CO2 15* 16*  --   --  22 22  GLUCOSE 101* 97 94  --  99 111*  BUN 18 19 23*  --  22* 22*  CREATININE 2.37* 2.35* 2.00*  --  2.67* 3.34*  CALCIUM 8.7* 8.5*  --   --  7.6* 7.6*  MG  --   --   --  2.5*  --   --   PHOS  --   --   --  4.0  --   --    Liver Function Tests:  Recent Labs Lab 10/16/16 1159 10/16/16 1616 10/17/16 0442  AST 148* 104* 72*  ALT 65* 57 42  ALKPHOS 58 51 49  BILITOT 3.5* 2.0* 1.2  PROT RESULTS UNAVAILABLE DUE TO INTERFERING SUBSTANCE 7.8 6.6  ALBUMIN 3.5 3.1* 2.5*   Coagulation  Profile:  Recent Labs Lab 10/16/16 1106 10/17/16 0952  INR 1.06 0.93   Cardiac Enzymes:  Recent Labs Lab 10/17/16 0952  CKTOTAL 224     Recent Results (from the past 240 hour(s))  Culture, Urine     Status: None   Collection Time: 10/16/16 12:42 PM  Result Value Ref Range Status   Specimen Description URINE, CLEAN CATCH  Final   Special Requests NONE  Final   Culture NO GROWTH  Final   Report Status 10/17/2016 FINAL  Final  Culture, blood (Routine X 2) w Reflex to ID Panel     Status: None (Preliminary result)   Collection Time: 10/16/16  5:26 PM  Result Value Ref Range Status   Specimen Description BLOOD LEFT FOREARM  Final   Special Requests   Final    BOTTLES DRAWN AEROBIC ONLY Blood Culture adequate volume   Culture NO GROWTH < 24 HOURS  Final   Report Status PENDING  Incomplete  Culture, blood (Routine X 2) w Reflex to ID Panel     Status: None (Preliminary result)   Collection Time: 10/16/16  5:38  PM  Result Value Ref Range Status   Specimen Description BLOOD LEFT ANTECUBITAL  Final   Special Requests   Final    BOTTLES DRAWN AEROBIC ONLY Blood Culture adequate volume   Culture NO GROWTH < 24 HOURS  Final   Report Status PENDING  Incomplete         Radiology Studies: US Renal  Result Date: 10/16/2016 CLINICAL DATA:  Acute renal failure. Exophytic left renal lesion on motion degraded CTs earlier today. EXAM: RENAL / URINARY TRACT ULTRASOUND COMPLETE COMPARISON:  CT scan 10/16/2016 FINDINGS: Right Kidney: Length: 13.3 cm.  No hydronephrosis. Left Kidney: Length: 12.1 cm. No hydronephrosis. Exophytic interpolar left renal lesion not well seen by ultrasound but not definitely cystic. Bladder: Decompressed. IMPRESSION: 11 mm interpolar left exophytic renal lesion. Ultrasound imaging indeterminate. After resolution of acute symptoms and when the patient can participate in reproducible breath holding, outpatient abdominal MRI without and with contrast may prove  helpful to further evaluate. Electronically Signed   By: Misty Stanley M.D.   On: 10/16/2016 17:33        Scheduled Meds: . gabapentin  300 mg Oral QHS   Continuous Infusions:   LOS: 1 day     HONGALGI,ANAND, MD, FACP, FHM. [facility] Pager [phone]  If 7PM-7AM, please contact night-coverage [email or web address] Password [email or web address] 10/18/2016, 2:51 PM

## 2016-10-19 LAB — CBC
HCT: 30.1 % — ABNORMAL LOW (ref 39.0–52.0)
Hemoglobin: 10.2 g/dL — ABNORMAL LOW (ref 13.0–17.0)
MCH: 33.4 pg (ref 26.0–34.0)
MCHC: 33.9 g/dL (ref 30.0–36.0)
MCV: 98.7 fL (ref 78.0–100.0)
PLATELETS: 147 10*3/uL — AB (ref 150–400)
RBC: 3.05 MIL/uL — AB (ref 4.22–5.81)
RDW: 13.1 % (ref 11.5–15.5)
WBC: 5.2 10*3/uL (ref 4.0–10.5)

## 2016-10-19 LAB — COMPREHENSIVE METABOLIC PANEL
ALBUMIN: 2.2 g/dL — AB (ref 3.5–5.0)
ALT: 32 U/L (ref 17–63)
AST: 47 U/L — ABNORMAL HIGH (ref 15–41)
Alkaline Phosphatase: 45 U/L (ref 38–126)
Anion gap: 9 (ref 5–15)
BUN: 20 mg/dL (ref 6–20)
CHLORIDE: 101 mmol/L (ref 101–111)
CO2: 19 mmol/L — AB (ref 22–32)
Calcium: 7.7 mg/dL — ABNORMAL LOW (ref 8.9–10.3)
Creatinine, Ser: 2.8 mg/dL — ABNORMAL HIGH (ref 0.61–1.24)
GFR calc Af Amer: 28 mL/min — ABNORMAL LOW (ref >60)
GFR calc non Af Amer: 24 mL/min — ABNORMAL LOW (ref >60)
Glucose, Bld: 109 mg/dL — ABNORMAL HIGH (ref 65–99)
Potassium: 3.7 mmol/L (ref 3.5–5.1)
SODIUM: 129 mmol/L — AB (ref 135–145)
TOTAL PROTEIN: 5.8 g/dL — AB (ref 6.5–8.1)
Total Bilirubin: 0.6 mg/dL (ref 0.3–1.2)

## 2016-10-19 LAB — IMMUNOFIXATION ELECTROPHORESIS
IGA: 511 mg/dL — AB (ref 90–386)
IGG (IMMUNOGLOBIN G), SERUM: 1445 mg/dL (ref 700–1600)
IGM, SERUM: 457 mg/dL — AB (ref 20–172)
TOTAL PROTEIN ELP: 7.5 g/dL (ref 6.0–8.5)

## 2016-10-19 MED ORDER — GABAPENTIN 100 MG PO CAPS: 300.0000 mg | ORAL_CAPSULE | Freq: Every day | ORAL | Status: DC

## 2016-10-19 NOTE — Discharge Summary (Signed)
Physician Discharge Summary  Stephen Bowers MLY:[id number] DOB: 1960/10/16  PCP: Minerva Ends, MD  Admit date: 10/16/2016 Discharge date: 10/19/2016  Recommendations for Outpatient Follow-up:  1. Dr. Riki Rusk, PCP at the [facility] on 10/24/16 at 3 PM. To be seen with repeat labs (CBC & BMP). Please follow final blood cultures that were sent from the hospital. Will need further evaluation of renal lesion seen on CT/ultrasound while hospitalized. 2. Dr. Madelon Lips, Nephrology: MDs office will arrange follow-up in 4-6 weeks including evaluation of renal lesion.  Home Health: None Equipment/Devices: None    Discharge Condition: Improved and stable  CODE STATUS: Full  Diet recommendation: Heart healthy diet.  Discharge Diagnoses:  Active Problems:   Low back pain potentially associated with radiculopathy   HTN (hypertension)   Vitamin D insufficiency   Acute renal failure (ARF) (HCC)   Hyperlipidemia   Elevated serum creatinine   Abdominal pain   Brief Summary: 56 year old male with PMH of HTN, HLD, works moving heavy objects daily, presented with 3-4 days history of gradual onset of moderate to severe LUQ pain without radiation, ? worse with movement, mild nausea and self-limited 3 episodes of nonbloody emesis without fever or chills, dysuria, urinary frequency, decreased urine output or change in urinary color. Takes NSAIDs/Naprosyn for neuropathy and twice-weekly BC powder. Lab work consistent with acute kidney injury. Etiology of LUQ pain unclear but resolved. Nephrology consulted.   Assessment & Plan:   1. LUQ abdominal pain: Urine microscopy and symptomatology not consistent with UTI. Urine culture negative. CT abdomen and pelvis without contrast 4/16: Motion artifact at level of kidneys partially obscures renal parenchyma but no hydronephrosis or urinary stone disease. Moderate bilateral perinephric stranding of unclear  significance. 13 mm left kidney interpolar exophytic motion degraded indeterminate lesion, possibly hemorrhagic cyst. Abdominal pain resolved.? Related to acute kidney injury.? Musculoskeletal pain related to occupation.  2. Left exophytic renal lesion: On CT abdomen: 13 mm left kidney interpolar exophytic lesion, possibly hemorrhagic cyst. On renal ultrasound, 11 mm interpolar left exophytic renal lesion and indeterminate. Outpatient abdominal MRI without and with contrast recommended. I discussed imaging results with urologist on call on 4/18 and he recommended OP follow up imaging as above. Discussed with nephrologist at DC and will arrange OP follow in office where will further evaluate. 3. Acute kidney injury: Normal creatinine of 0.95 in December 2017. Now presented with creatinine of 2.37 which had increased to 3.34. Urine microscopy showed hyaline casts. No hydronephrosis by imaging. CK and haptoglobin normal. LDH mildly elevated. HCV antibody and hepatitis B surface antigen negative. C3 elevated. C4 normal. ANA negative. SPEP: Polyclonal increase in gamma globulin. 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 HIV antibodies: Nonreactive. Blood cultures 2: Negative to date. Urine culture: Negative. Discontinued ceftriaxone.? Related to ARB/NSAIDs-held. IV fluids. Creatinine improved to 2.8. As per Nephrology follow up and discussion with Dr. Hollie Salk, [place] to [place] home with OP follow up.  4. Hyperkalemia: Resolved. 5. Hyponatremia: Clinically euvolemic.? Related to acute kidney injury. Stable. 6. Essential hypertension: Controlled. ARB DC'ed. Currently controlled off medications> follow up in office. May consider Amlodipine if needed. 7. Anemia: Gradual drop in Hb. No overt bleeding reported. Follow CBCs.  8. Thrombocytopenia: Unclear history. No alcohol history. HCV antibody and hepatitis B surface antigen negative. Transient bilirubin elevation,  resolved and mild AST elevation of unclear significance. Stable. No schistocytes on smear. Not suggestive of hemolysis. Fibrinogen elevated.  Coags normal. Improving. 9. Chronic low back pain with neuropathy: Continue Neurontin > renally dosed. 10. Fatty liver: Seen on imaging. OP follow up    Consultants:  Nephrology   Procedures:  None   Discharge Instructions  Discharge Instructions    Call MD for:  difficulty breathing, headache or visual disturbances    Complete by:  As directed    Call MD for:  extreme fatigue    Complete by:  As directed    Call MD for:  persistant dizziness or light-headedness    Complete by:  As directed    Call MD for:  persistant nausea and vomiting    Complete by:  As directed    Call MD for:  severe uncontrolled pain    Complete by:  As directed    Diet - low sodium heart healthy    Complete by:  As directed    Increase activity slowly    Complete by:  As directed        Medication List    STOP taking these medications   losartan 50 MG tablet Commonly known as:  COZAAR   naproxen 500 MG tablet Commonly known as:  NAPROSYN   Vitamin D (Ergocalciferol) 50000 units Caps capsule Commonly known as:  DRISDOL     TAKE these medications   acetaminophen 500 MG tablet Commonly known as:  TYLENOL Take 500-1,000 mg by mouth every 6 (six) hours as needed (for pain or headaches).   gabapentin 100 MG capsule Commonly known as:  NEURONTIN Take 3 capsules (300 mg total) by mouth at bedtime.   ketotifen 0.025 % ophthalmic solution Commonly known as:  ZADITOR Place 1 drop into both eyes 2 (two) times daily.      Follow-up Information    [facility]. Go on 10/24/2016.   Why:  post hospital follow up appointment scheduled for 10/24/2016 with Dr.Funches at 3:00pm. To be seen with repeat labs (CBC & BMP). Will need further evaluation of renal lesion seen on CT/ultrasound while hospitalized.` Contact  information: [place] [address] [phone]       Madelon Lips, MD Follow up.   Specialty:  Nephrology Why:  MDs office will arrange follow-up in 4-6 weeks. Contact information: [place] [address] [phone]          No Known Allergies    Procedures/Studies: Ct Abdomen Pelvis Wo Contrast  Result Date: 10/16/2016 CLINICAL DATA:  56 y/o M; left lower quadrant pain radiating to the left flank. EXAM: CT ABDOMEN AND PELVIS WITHOUT CONTRAST TECHNIQUE: Multidetector CT imaging of the abdomen and pelvis was performed following the standard protocol without IV contrast. COMPARISON:  06/19/2014 abdominal ultrasound FINDINGS: Lower chest: No acute abnormality. Hepatobiliary: Well-circumscribed 13 mm low-attenuation focus within segment 4B each likely representing a cyst. Hepatic steatosis. Normal gallbladder. No intra or extrahepatic biliary ductal dilatation. Pancreas: Unremarkable. No pancreatic ductal dilatation or surrounding inflammatory changes. Spleen: Normal in size without focal abnormality. Adrenals/Urinary Tract: Mild motion artifact through the kidneys partially obscures renal parenchyma. Left kidney interpolar cortical defect with fat attenuation probably representing scarring. 13 mm intermediate attenuation well-circumscribed exophytic lesion of the left interpolar kidney. (series 301, image 33). Moderate bilateral perinephric stranding is nonspecific. No urinary stone disease or hydronephrosis. Normal bladder. Stomach/Bowel: Stomach is within normal limits. Appendix appears normal. No evidence of bowel wall thickening, distention, or inflammatory changes. Vascular/Lymphatic: Aortic atherosclerosis. No enlarged abdominal or pelvic lymph nodes. Reproductive: Prostate  is unremarkable. Other: No abdominal wall hernia or abnormality. No abdominopelvic ascites. Musculoskeletal: No fracture is seen. IMPRESSION: 1. Motion artifact at  level of the kidneys partially obscures renal parenchyma. No hydronephrosis or urinary stone disease. 2. Moderate bilateral perinephric stranding of uncertain significance, possibly related to the kidney injury or infection of the renal collecting system. 3. 13 mm left kidney interpolar exophytic motion degraded indeterminate lesion, possibly hemorrhagic cyst. Consider renal ultrasound for further characterization. 4. Aortic atherosclerosis. 5. Hepatic steatosis. Electronically Signed   By: Kristine Garbe M.D.   On: 10/16/2016 14:33   US Renal  Result Date: 10/16/2016 CLINICAL DATA:  Acute renal failure. Exophytic left renal lesion on motion degraded CTs earlier today. EXAM: RENAL / URINARY TRACT ULTRASOUND COMPLETE COMPARISON:  CT scan 10/16/2016 FINDINGS: Right Kidney: Length: 13.3 cm.  No hydronephrosis. Left Kidney: Length: 12.1 cm. No hydronephrosis. Exophytic interpolar left renal lesion not well seen by ultrasound but not definitely cystic. Bladder: Decompressed. IMPRESSION: 11 mm interpolar left exophytic renal lesion. Ultrasound imaging indeterminate. After resolution of acute symptoms and when the patient can participate in reproducible breath holding, outpatient abdominal MRI without and with contrast may prove helpful to further evaluate. Electronically Signed   By: Misty Stanley M.D.   On: 10/16/2016 17:33      Subjective: No pain reported . Denies any complaints.  Discharge Exam:  Vitals:   10/19/16 0848 10/19/16 0900 10/19/16 1401 10/19/16 1433  BP:  120/78 122/85 134/88  Pulse: 70 78 78 76  Resp:  20 20   Temp:  99.1 F (37.3 C) 98.6 F (37 C) 98.3 F (36.8 C)  TempSrc:  Oral  Oral  SpO2: 99% 100% 100% 100%  Weight:  83.1 kg (183 lb 3.2 oz)    Height:        General exam: Pleasant middle-aged male sitting up comfortably in bed. Respiratory system: Clear to auscultation. Respiratory effort normal. Cardiovascular system: S1 & S2 heard, RRR. No JVD, murmurs,  rubs, gallops or clicks. No pedal edema. Gastrointestinal system: Abdomen is nondistended, soft and nontender. No organomegaly or masses felt. Normal bowel sounds heard. Central nervous system: Alert and oriented. No focal neurological deficits. Extremities: Symmetric 5 x 5 power. Skin: No rashes, lesions or ulcers Psychiatry: Judgement and insight appear normal. Mood & affect appropriate.     The results of significant diagnostics from this hospitalization (including imaging, microbiology, ancillary and laboratory) are listed below for reference.     Microbiology: Recent Results (from the past 240 hour(s))  Culture, Urine     Status: None   Collection Time: 10/16/16 12:42 PM  Result Value Ref Range Status   Specimen Description URINE, CLEAN CATCH  Final   Special Requests NONE  Final   Culture NO GROWTH  Final   Report Status 10/17/2016 FINAL  Final  Culture, blood (Routine X 2) w Reflex to ID Panel     Status: None (Preliminary result)   Collection Time: 10/16/16  5:26 PM  Result Value Ref Range Status   Specimen Description BLOOD LEFT FOREARM  Final   Special Requests   Final    BOTTLES DRAWN AEROBIC ONLY Blood Culture adequate volume   Culture NO GROWTH 3 DAYS  Final   Report Status PENDING  Incomplete  Culture, blood (Routine X 2) w Reflex to ID Panel     Status: None (Preliminary result)   Collection Time: 10/16/16  5:38 PM  Result Value Ref Range Status   Specimen Description BLOOD LEFT  ANTECUBITAL  Final   Special Requests   Final    BOTTLES DRAWN AEROBIC ONLY Blood Culture adequate volume   Culture NO GROWTH 3 DAYS  Final   Report Status PENDING  Incomplete     Labs: CBC:  Recent Labs Lab 10/16/16 1106 10/16/16 1338 10/17/16 0442 10/17/16 0952 10/19/16 0350  WBC 10.3  --  7.4  --  5.2  NEUTROABS 7.9*  --   --   --   --   HGB 15.4 13.3 11.1*  --  10.2*  HCT 43.3 39.0 32.6*  --  30.1*  MCV 98.0  --  98.5  --  98.7  PLT 102*  --  97* 107* 147*   Basic  Metabolic Panel:  Recent Labs Lab 10/16/16 1159 10/16/16 1239 10/16/16 1338 10/16/16 1747 10/17/16 0442 10/18/16 0514 10/19/16 0350  NA 129* 129* 134*  --  133* 130* 129*  K 6.2* 5.4* 4.7  --  3.9 3.5 3.7  CL 96* 95* 104  --  101 102 101  CO2 15* 16*  --   --  22 22 19*  GLUCOSE 101* 97 94  --  99 111* 109*  BUN 18 19 23*  --  22* 22* 20  CREATININE 2.37* 2.35* 2.00*  --  2.67* 3.34* 2.80*  CALCIUM 8.7* 8.5*  --   --  7.6* 7.6* 7.7*  MG  --   --   --  2.5*  --   --   --   PHOS  --   --   --  4.0  --   --   --    Liver Function Tests:  Recent Labs Lab 10/16/16 1159 10/16/16 1616 10/17/16 0442 10/19/16 0350  AST 148* 104* 72* 47*  ALT 65* 57 42 32  ALKPHOS 58 51 49 45  BILITOT 3.5* 2.0* 1.2 0.6  PROT RESULTS UNAVAILABLE DUE TO INTERFERING SUBSTANCE 7.8 6.6 5.8*  ALBUMIN 3.5 3.1* 2.5* 2.2*   Cardiac Enzymes:  Recent Labs Lab 10/17/16 0952  CKTOTAL 224   Urinalysis    Component Value Date/Time   COLORURINE AMBER (A) 10/16/2016 1242   APPEARANCEUR HAZY (A) 10/16/2016 1242   LABSPEC 1.018 10/16/2016 1242   PHURINE 5.0 10/16/2016 1242   GLUCOSEU NEGATIVE 10/16/2016 1242   HGBUR NEGATIVE 10/16/2016 1242   BILIRUBINUR NEGATIVE 10/16/2016 1242   BILIRUBINUR neg 08/02/2015 1039   KETONESUR 5 (A) 10/16/2016 1242   PROTEINUR 30 (A) 10/16/2016 1242   UROBILINOGEN 0.2 08/02/2015 1039   UROBILINOGEN 0.2 06/19/2014 1556   NITRITE NEGATIVE 10/16/2016 1242   LEUKOCYTESUR NEGATIVE 10/16/2016 1242      Time coordinating discharge: Over 30 minutes  SIGNED:  Vernell Leep, MD, FACP, FHM. [facility] Pager [phone]  If 7PM-7AM, please contact night-coverage [email or web address] Password [facility] 10/19/2016, 3:28 PM

## 2016-10-19 NOTE — Progress Notes (Signed)
[facility] Progress Note    Assessment/ Plan:   MPN:TIRWERXV. sCr 0.9 (b/l)-->2.37 (admission)-->3.34-->2.8. AKI likely due to ARB, BC powder and hypoperfusion. CK, autoimmune labs, UA and urine sediments not revealing much. UOP 0.51ml./kg/hr but he reports good UOP.  Renal US with 11 mm interpolar left exophytic renal lesion. Doubt this is contributing to his AKI. UA and Urine culture are negative. Antibiotics d/cd 4/18.   Stop IVF. He has good PO intake.  Agree holding losartan  Avoid nephrotoxic drugs  Gabapentin dosed renally  Renal biopsy if no improvement in his renal function.   Out patient follow up for renal lesion on CT and [place].   Can be discharged and followed outpatient from renal stand point.   Hypertension: normotensive this hospitalization.   No ACEi or ARB until follow up with PCP  Recommend starting with amlodipine if he needs BP control  Hyperkalemia: resolved.    Hyponatremia: hypotonic hypoNa. Stable. Na 130.   Transaminases: resolved.  AST/ALT 148/65-->47/32.. Likely alcohol induced in the setting of normal CK. Hepatitis viral panel within normal.  Thrombocytopenia: improved. Plt 147. Likely alcohol induced. Alcoholic pattern of AST and ALT elevation on admission although he denies drinking recently. CT abdomen with hepatic steatosis as well.  Check CBC as needed  Subjective:   No complaint this morning. Feels well. Abdominal pain resolved. Reports good urine output. Very eager to go home.    Objective:   BP 128/79 (BP Location: Left Arm)   Pulse 70   Temp 99.6 F (37.6 C)   Resp 18   Ht 6\' 3"  (1.905 m)   Wt 183 lb 3.2 oz (83.1 kg)   SpO2 99%   BMI 22.90 kg/m   Intake/Output Summary (Last 24 hours) at 10/19/16 0919 Last data filed at 10/18/16 1838  Gross per 24 hour  Intake              934 ml  Output              300 ml  Net              634 ml   Weight change:   Physical Exam: GEN: sitting on the edge of the bed  eating his breakfast. CVS: RRR, nl s1 & s2, no murmurs, no edema RESP: speaks in full sentence, no IWOB GI: BS present & normal, soft, NTND, no guarding, no rebound, no mass MSK: no focal tenderness or notable swelling SKIN: no apparent skin lesion or jauncice NEURO: alert and oiented appropriately, no gross defecits  PSYCH: euthymic mood with congruent affect  Imaging: No results found.  Labs: BMET  Recent Labs Lab 10/16/16 1159 10/16/16 1239 10/16/16 1338 10/16/16 1747 10/17/16 0442 10/18/16 0514 10/19/16 0350  NA 129* 129* 134*  --  133* 130* 129*  K 6.2* 5.4* 4.7  --  3.9 3.5 3.7  CL 96* 95* 104  --  101 102 101  CO2 15* 16*  --   --  22 22 19*  GLUCOSE 101* 97 94  --  99 111* 109*  BUN 18 19 23*  --  22* 22* 20  CREATININE 2.37* 2.35* 2.00*  --  2.67* 3.34* 2.80*  CALCIUM 8.7* 8.5*  --   --  7.6* 7.6* 7.7*  PHOS  --   --   --  4.0  --   --   --    CBC  Recent Labs Lab 10/16/16 1106 10/16/16 1338 10/17/16 0442 10/17/16 0952 10/19/16  0350  WBC 10.3  --  7.4  --  5.2  NEUTROABS 7.9*  --   --   --   --   HGB 15.4 13.3 11.1*  --  10.2*  HCT 43.3 39.0 32.6*  --  30.1*  MCV 98.0  --  98.5  --  98.7  PLT 102*  --  97* 107* 147*    Medications:    . gabapentin  300 mg Oral QHS    Wendee Beavers, MD.  PGY-2 Pager [phone] 10/19/16  9:19 AM

## 2016-10-21 LAB — CULTURE, BLOOD (ROUTINE X 2)
Culture: NO GROWTH
Culture: NO GROWTH
SPECIAL REQUESTS: ADEQUATE
Special Requests: ADEQUATE

## 2016-10-24 ENCOUNTER — Encounter: Payer: Self-pay | Admitting: Family Medicine

## 2016-10-24 ENCOUNTER — Ambulatory Visit: Payer: Self-pay | Attending: Family Medicine | Admitting: Family Medicine

## 2016-10-24 VITALS — BP 143/87 | HR 79 | Temp 98.2°F | Ht 72.0 in | Wt 190.6 lb

## 2016-10-24 DIAGNOSIS — R1012 Left upper quadrant pain: Secondary | ICD-10-CM

## 2016-10-24 DIAGNOSIS — G8929 Other chronic pain: Secondary | ICD-10-CM | POA: Insufficient documentation

## 2016-10-24 DIAGNOSIS — Z87891 Personal history of nicotine dependence: Secondary | ICD-10-CM | POA: Insufficient documentation

## 2016-10-24 DIAGNOSIS — D649 Anemia, unspecified: Secondary | ICD-10-CM | POA: Insufficient documentation

## 2016-10-24 DIAGNOSIS — R200 Anesthesia of skin: Secondary | ICD-10-CM | POA: Insufficient documentation

## 2016-10-24 DIAGNOSIS — N179 Acute kidney failure, unspecified: Secondary | ICD-10-CM | POA: Insufficient documentation

## 2016-10-24 DIAGNOSIS — I10 Essential (primary) hypertension: Secondary | ICD-10-CM | POA: Insufficient documentation

## 2016-10-24 DIAGNOSIS — H1013 Acute atopic conjunctivitis, bilateral: Secondary | ICD-10-CM | POA: Insufficient documentation

## 2016-10-24 DIAGNOSIS — E559 Vitamin D deficiency, unspecified: Secondary | ICD-10-CM | POA: Insufficient documentation

## 2016-10-24 MED ORDER — AMLODIPINE BESYLATE 2.5 MG PO TABS: 2.5000 mg | ORAL_TABLET | Freq: Every day | ORAL | 3 refills | Status: DC

## 2016-10-24 MED ORDER — ACETAMINOPHEN-CODEINE #3 300-30 MG PO TABS: 1.0000 | ORAL_TABLET | Freq: Two times a day (BID) | ORAL | 0 refills | Status: DC | PRN

## 2016-10-24 MED ORDER — KETOTIFEN FUMARATE 0.025 % OP SOLN: 1.0000 [drp] | Freq: Two times a day (BID) | OPHTHALMIC | 1 refills | Status: DC

## 2016-10-24 NOTE — Progress Notes (Signed)
Pt needs refill on all medications.

## 2016-10-24 NOTE — Patient Instructions (Addendum)
Stephen Bowers was seen today for hospitalization follow-up.  Diagnoses and all orders for this visit:  Essential hypertension -     amLODipine (NORVASC) 2.5 MG tablet; Take 1 tablet (2.5 mg total) by mouth daily.  Allergic conjunctivitis, bilateral -     ketotifen (ZADITOR) 0.025 % ophthalmic solution; Place 1 drop into both eyes 2 (two) times daily.  Numbness of fingers of both hands -     acetaminophen-codeine (TYLENOL #3) 300-30 MG tablet; Take 1 tablet by mouth 2 (two) times daily as needed for moderate pain.  AKI (acute kidney injury) ([place]) -     Basic Metabolic Panel -     CBC  Vitamin D deficiency -     Vitamin D, 25-hydroxy   f/u in  6 weeks for HTN and acute kidney injury  Dr. Adrian Blackwater

## 2016-10-24 NOTE — Progress Notes (Signed)
Subjective:  Patient ID: Stephen Bowers, male    DOB: [birth year]  Age: 56 y.o. MRN: [id number]  CC: Hospitalization Follow-up   HPI Stephen Bowers presents for   1. Acute kidney injury: patient hospitalized from 4/16-4/19/18 for abdominal pain. Found to have acute kidney injury with Cr of 2.4 up from baseline of 0.9 and GFR of 34, potassium 6.2,  in setting of  Losartan 50 mg daily,  Naproxen 50 mg nightly use and abdominal pain. He denies high dose NSAID, emesis and diarrhea. CT abdomen/pelvis without contrast and renal ultrasound done. A 13 mm L kidney lesion consistent with hemorrhagic cyst noted. His losartan 50 mg was held. He was advised to avoid NSAIDs.   Today he reports abdominal pain has resolved. He denies HA, CP, SOB and leg swelling. He has f/u with nephrology. He is avoiding NSAIDs. He has chronic pain in his hands.   Social History  Substance Use Topics  . Smoking status: Former Smoker    Types: Cigarettes  . Smokeless tobacco: Never Used  . Alcohol use 2.4 oz/week    4 Cans of beer per week     Comment: weekends, 4/week    Outpatient Medications Prior to Visit  Medication Sig Dispense Refill  . acetaminophen (TYLENOL) 500 MG tablet Take 500-1,000 mg by mouth every 6 (six) hours as needed (for pain or headaches).    . gabapentin (NEURONTIN) 100 MG capsule Take 3 capsules (300 mg total) by mouth at bedtime.    [name] ketotifen (ZADITOR) 0.025 % ophthalmic solution Place 1 drop into both eyes 2 (two) times daily. 10 mL 1   No facility-administered medications prior to visit.     ROS Review of Systems  Constitutional: Negative for chills, fatigue, fever and unexpected weight change.  Eyes: Negative for visual disturbance.  Respiratory: Negative for cough and shortness of breath.   Cardiovascular: Negative for chest pain, palpitations and leg swelling.  Gastrointestinal: Negative for abdominal pain, blood in stool, constipation, diarrhea, nausea and vomiting.    Endocrine: Negative for polydipsia, polyphagia and polyuria.  Musculoskeletal: Negative for arthralgias, back pain, gait problem, myalgias and neck pain.  Skin: Negative for rash.  Allergic/Immunologic: Negative for immunocompromised state.  Hematological: Negative for adenopathy. Does not bruise/bleed easily.  Psychiatric/Behavioral: Negative for dysphoric mood, sleep disturbance and suicidal ideas. The patient is not nervous/anxious.     Objective:  BP (!) 143/87   Pulse 79   Temp 98.2 F (36.8 C) (Oral)   Ht 6' (1.829 m)   Wt 190 lb 9.6 oz (86.5 kg)   SpO2 100%   BMI 25.85 kg/m   BP/Weight 10/24/2016 10/19/2016 63/78/5885  Systolic BP 027 741 287  Diastolic BP 87 88 84  Wt. (Lbs) 190.6 183.2 188.4  BMI 25.85 22.9 25.55    Physical Exam  Constitutional: He appears well-developed and well-nourished. No distress.  HENT:  Head: Normocephalic and atraumatic.  Eyes: Left conjunctiva is injected.  Neck: Normal range of motion. Neck supple.  Cardiovascular: Normal rate, regular rhythm, normal heart sounds and intact distal pulses.   Pulmonary/Chest: Effort normal and breath sounds normal.  Musculoskeletal: He exhibits no edema.  Neurological: He is alert.  Skin: Skin is warm and dry. No rash noted. No erythema.  Psychiatric: He has a normal mood and affect.     Chemistry      Component Value Date/Time   NA 136 10/24/2016 1631   K 4.8 10/24/2016 1631   CL 98 10/24/2016 1631  CO2 22 10/24/2016 1631   BUN 16 10/24/2016 1631   CREATININE 1.60 (H) 10/24/2016 1631   CREATININE 0.95 06/12/2016 1733      Component Value Date/Time   CALCIUM 9.1 10/24/2016 1631   ALKPHOS 45 10/19/2016 0350   AST 47 (H) 10/19/2016 0350   ALT 32 10/19/2016 0350   BILITOT 0.6 10/19/2016 0350     Lab Results  Component Value Date   WBC 7.9 10/24/2016   HGB 10.2 (L) 10/19/2016   HCT 29.3 (L) 10/24/2016   MCV 95 10/24/2016   PLT 693 (H) 10/24/2016     Assessment & Plan:  Stephen Bowers was  seen today for hospitalization follow-up.  Diagnoses and all orders for this visit:  Essential hypertension -     amLODipine (NORVASC) 2.5 MG tablet; Take 1 tablet (2.5 mg total) by mouth daily.  Allergic conjunctivitis, bilateral -     ketotifen (ZADITOR) 0.025 % ophthalmic solution; Place 1 drop into both eyes 2 (two) times daily.  Numbness of fingers of both hands -     acetaminophen-codeine (TYLENOL #3) 300-30 MG tablet; Take 1 tablet by mouth 2 (two) times daily as needed for moderate pain.  AKI (acute kidney injury) ([place]) -     Basic Metabolic Panel -     CBC  Vitamin D deficiency -     Vitamin D, 25-hydroxy -     Vitamin D, Ergocalciferol, (DRISDOL) 50000 units CAPS capsule; Take 1 capsule (50,000 Units total) by mouth every 7 (seven) days. For 8 weeks  Anemia, unspecified type -     CBC -     ferrous sulfate 325 (65 FE) MG tablet; Take 1 tablet (325 mg total) by mouth 2 (two) times daily with a meal.   There are no diagnoses linked to this encounter.  No orders of the defined types were placed in this encounter.   Follow-up: Return in about 6 weeks (around 12/05/2016) for HTN and AKI.   Boykin Nearing MD

## 2016-10-25 LAB — CBC
HEMATOCRIT: 29.3 % — AB (ref 37.5–51.0)
HEMOGLOBIN: 10.1 g/dL — AB (ref 13.0–17.7)
MCH: 32.9 pg (ref 26.6–33.0)
MCHC: 34.5 g/dL (ref 31.5–35.7)
MCV: 95 fL (ref 79–97)
Platelets: 693 10*3/uL — ABNORMAL HIGH (ref 150–379)
RBC: 3.07 x10E6/uL — AB (ref 4.14–5.80)
RDW: 13.6 % (ref 12.3–15.4)
WBC: 7.9 10*3/uL (ref 3.4–10.8)

## 2016-10-25 LAB — BASIC METABOLIC PANEL
BUN/Creatinine Ratio: 10 (ref 9–20)
BUN: 16 mg/dL (ref 6–24)
CALCIUM: 9.1 mg/dL (ref 8.7–10.2)
CO2: 22 mmol/L (ref 18–29)
CREATININE: 1.6 mg/dL — AB (ref 0.76–1.27)
Chloride: 98 mmol/L (ref 96–106)
GFR calc Af Amer: 56 mL/min/{1.73_m2} — ABNORMAL LOW (ref >59)
GFR calc non Af Amer: 48 mL/min/{1.73_m2} — ABNORMAL LOW (ref >59)
Glucose: 103 mg/dL — ABNORMAL HIGH (ref 65–99)
Potassium: 4.8 mmol/L (ref 3.5–5.2)
Sodium: 136 mmol/L (ref 134–144)

## 2016-10-25 LAB — VITAMIN D 25 HYDROXY (VIT D DEFICIENCY, FRACTURES): Vit D, 25-Hydroxy: 18.7 ng/mL — ABNORMAL LOW (ref 30.0–100.0)

## 2016-10-26 MED ORDER — FERROUS SULFATE 325 (65 FE) MG PO TABS: 325.0000 mg | ORAL_TABLET | Freq: Two times a day (BID) | ORAL | 3 refills | Status: DC

## 2016-10-26 MED ORDER — VITAMIN D (ERGOCALCIFEROL) 1.25 MG (50000 UNIT) PO CAPS: 50000.0000 [IU] | ORAL_CAPSULE | ORAL | 0 refills | Status: DC

## 2016-10-26 NOTE — Assessment & Plan Note (Signed)
Resolving Plan to treat mild HTN with norvasc Continue to avoid NSAIDs, no ACE/ARB

## 2016-10-26 NOTE — Assessment & Plan Note (Signed)
Chronic Will treat mild anemia with iron Tylenol #3 for associated pain  Avoiding NSAIDs

## 2016-10-26 NOTE — Assessment & Plan Note (Signed)
Resolved

## 2016-10-26 NOTE — Assessment & Plan Note (Signed)
Mild elevated BP norvasc 2.5 mg daily

## 2016-11-01 ENCOUNTER — Telehealth: Payer: Self-pay

## 2016-11-01 NOTE — Telephone Encounter (Signed)
Pt was called and informed of lab results. 

## 2016-11-15 ENCOUNTER — Encounter: Payer: Self-pay | Admitting: Family Medicine

## 2016-11-29 MED FILL — VIT D2 1.25 MG (50,000 UNIT: 1.25 MG | 84 days supply | Qty: 8 | Fill #0

## 2016-11-29 MED FILL — FERROUS SULFATE 325 MG TAB: 325 (65 FE) | 30 days supply | Qty: 60 | Fill #0

## 2016-11-29 MED FILL — LOSARTAN POTASSIUM 25 MG TA: 25 | 30 days supply | Qty: 30 | Fill #0

## 2016-11-29 MED FILL — ACETAMINOPHEN/COD #3 TABLET: 300-30 | 30 days supply | Qty: 60 | Fill #0

## 2016-11-29 MED FILL — ?AMLODIPINE BESYLATE 2.5MG: 2.5 | 30 days supply | Qty: 30 | Fill #0

## 2016-12-01 ENCOUNTER — Other Ambulatory Visit: Payer: Self-pay | Admitting: Nephrology

## 2016-12-01 DIAGNOSIS — N179 Acute kidney failure, unspecified: Secondary | ICD-10-CM

## 2016-12-24 ENCOUNTER — Ambulatory Visit
Admission: RE | Admit: 2016-12-24 | Discharge: 2016-12-24 | Disposition: A | Discharge status: Home / Self Care | Payer: No Typology Code available for payment source | Source: Ambulatory Visit | Attending: Nephrology | Admitting: Nephrology

## 2016-12-24 DIAGNOSIS — N179 Acute kidney failure, unspecified: Secondary | ICD-10-CM

## 2016-12-24 MED ORDER — GADOBENATE DIMEGLUMINE 529 MG/ML IV SOLN
18.0000 mL | Freq: Once | INTRAVENOUS | Status: AC | PRN
  Administered 2016-12-24: 18 mL via INTRAVENOUS

## 2017-03-20 ENCOUNTER — Telehealth: Payer: Self-pay

## 2017-03-20 DIAGNOSIS — I10 Essential (primary) hypertension: Secondary | ICD-10-CM

## 2017-03-20 DIAGNOSIS — R200 Anesthesia of skin: Secondary | ICD-10-CM

## 2017-03-20 DIAGNOSIS — D649 Anemia, unspecified: Secondary | ICD-10-CM

## 2017-03-20 DIAGNOSIS — E559 Vitamin D deficiency, unspecified: Secondary | ICD-10-CM

## 2017-03-20 MED ORDER — GABAPENTIN 100 MG PO CAPS: 300.0000 mg | ORAL_CAPSULE | Freq: Every day | ORAL | 0 refills | Status: DC

## 2017-03-20 MED ORDER — FERROUS SULFATE 325 (65 FE) MG PO TABS: 325.0000 mg | ORAL_TABLET | Freq: Two times a day (BID) | ORAL | 0 refills | Status: DC

## 2017-03-20 MED ORDER — AMLODIPINE BESYLATE 2.5 MG PO TABS: 2.5000 mg | ORAL_TABLET | Freq: Every day | ORAL | 1 refills | Status: DC

## 2017-03-20 NOTE — Telephone Encounter (Signed)
Pt contacted the office and is requesting medication refill for all medicines. Pt is requesting rx's to be sent to our pharmacy. Please f/u

## 2017-04-26 ENCOUNTER — Ambulatory Visit: Payer: No Typology Code available for payment source | Admitting: Internal Medicine

## 2017-06-08 ENCOUNTER — Encounter: Payer: Self-pay | Admitting: Internal Medicine

## 2017-06-08 ENCOUNTER — Ambulatory Visit: Payer: Self-pay | Attending: Internal Medicine | Admitting: Internal Medicine

## 2017-06-08 VITALS — BP 133/74 | HR 85 | Temp 98.7°F | Resp 18 | Ht 72.0 in | Wt 191.0 lb

## 2017-06-08 DIAGNOSIS — E785 Hyperlipidemia, unspecified: Secondary | ICD-10-CM | POA: Insufficient documentation

## 2017-06-08 DIAGNOSIS — N289 Disorder of kidney and ureter, unspecified: Secondary | ICD-10-CM

## 2017-06-08 DIAGNOSIS — I10 Essential (primary) hypertension: Secondary | ICD-10-CM

## 2017-06-08 DIAGNOSIS — N179 Acute kidney failure, unspecified: Secondary | ICD-10-CM | POA: Insufficient documentation

## 2017-06-08 DIAGNOSIS — Z79899 Other long term (current) drug therapy: Secondary | ICD-10-CM | POA: Insufficient documentation

## 2017-06-08 DIAGNOSIS — E559 Vitamin D deficiency, unspecified: Secondary | ICD-10-CM

## 2017-06-08 DIAGNOSIS — Z87891 Personal history of nicotine dependence: Secondary | ICD-10-CM | POA: Insufficient documentation

## 2017-06-08 DIAGNOSIS — Z23 Encounter for immunization: Secondary | ICD-10-CM

## 2017-06-08 DIAGNOSIS — D649 Anemia, unspecified: Secondary | ICD-10-CM

## 2017-06-08 DIAGNOSIS — G629 Polyneuropathy, unspecified: Secondary | ICD-10-CM | POA: Insufficient documentation

## 2017-06-08 DIAGNOSIS — R200 Anesthesia of skin: Secondary | ICD-10-CM

## 2017-06-08 MED ORDER — CARVEDILOL 12.5 MG PO TABS: 12.5000 mg | ORAL_TABLET | Freq: Two times a day (BID) | ORAL | 3 refills | Status: DC

## 2017-06-08 MED ORDER — FERROUS SULFATE 325 (65 FE) MG PO TABS: 325.0000 mg | ORAL_TABLET | Freq: Every day | ORAL | 1 refills | Status: DC

## 2017-06-08 MED ORDER — VITAMIN D 1000 UNITS PO TABS: 1000.0000 [IU] | ORAL_TABLET | Freq: Every day | ORAL | 1 refills | Status: DC

## 2017-06-08 MED ORDER — AMLODIPINE BESYLATE 10 MG PO TABS: 10.0000 mg | ORAL_TABLET | Freq: Every day | ORAL | 3 refills | Status: DC

## 2017-06-08 MED FILL — ?CARVEDILOL 12.5 MG TABLET: 12.5 | 30 days supply | Qty: 60 | Fill #0

## 2017-06-08 MED FILL — FERROUS SULFATE 325 MG TAB: 325 (65 FE) | 30 days supply | Qty: 30 | Fill #0

## 2017-06-08 MED FILL — AMLODIPINE BESYLATE 10 MG T: 10 | 30 days supply | Qty: 30 | Fill #0

## 2017-06-08 NOTE — Progress Notes (Signed)
Patient ID: Stephen Bowers, male    DOB: 07-Apr-1961  MRN: [id number]  CC: Hypertension   Subjective: Stephen Bowers is a 56 y.o. male who presents for chronic ds management. Last saw Dr. Adrian Blackwater 10/2016 His concerns today include:  HTN, AKI, vit D def, unspecified anemia , neuropathy in fingers  HTN:  Patient was hospitalized in April of this year with acute kidney injury questionably secondary to Naprosyn and ARB.  Also found to have left kidney lesion that turned out to be a left hemorrhagic kidney cyst on MRI, no further follow-up recommended.Stephen Bowers  His creatinine and GFR improved.  He patient has been seeing Dr. Hollie Salk at [facility]  Last seen 05/16/2017.  Blood pressure was found to be elevated.  Norvasc increased to 10 mg and started Coreg 12.5 mg twice daily -Patient would like to get these medicines at our pharmacy  -just purchase device to check BP. Checks BP twice a day.  Reports levels have been good. -no CP/LE/SOB/HA/ dizziness  2.  Numbness in hands have resolved.  No longer on gabapentin or Tylenol #3 Patient Active Problem List   Diagnosis Date Noted  . Hyperlipidemia 10/16/2016  . Elevated serum creatinine 10/16/2016  . AKI (acute kidney injury) ([place])   . Chronic musculoskeletal pain   . Numbness of fingers of both hands 06/14/2016  . Tinea pedis 08/02/2015  . Onychomycosis of toenail 08/02/2015  . HTN (hypertension) 03/02/2015  . Vitamin D deficiency 01/19/2015  . Low back pain potentially associated with radiculopathy 03/30/2014     Current Outpatient Medications on File Prior to Visit  Medication Sig Dispense Refill  . acetaminophen (TYLENOL) 500 MG tablet Take 500-1,000 mg by mouth every 6 (six) hours as needed (for pain or headaches).    Stephen Bowers acetaminophen-codeine (TYLENOL #3) 300-30 MG tablet Take 1 tablet by mouth 2 (two) times daily as needed for moderate pain. 60 tablet 0  . amLODipine (NORVASC) 2.5 MG tablet Take 1 tablet (2.5 mg total) by  mouth daily. 30 tablet 1  . ferrous sulfate 325 (65 FE) MG tablet Take 1 tablet (325 mg total) by mouth 2 (two) times daily with a meal. 60 tablet 0  . gabapentin (NEURONTIN) 100 MG capsule Take 3 capsules (300 mg total) by mouth at bedtime. 30 capsule 0  . ketotifen (ZADITOR) 0.025 % ophthalmic solution Place 1 drop into both eyes 2 (two) times daily. 10 mL 1  . Vitamin D, Ergocalciferol, (DRISDOL) 50000 units CAPS capsule Take 1 capsule (50,000 Units total) by mouth every 7 (seven) days. For 8 weeks 8 capsule 0   No current facility-administered medications on file prior to visit.     No Known Allergies  Social History   Socioeconomic History  . Marital status: Married    Spouse name: Not on file  . Number of children: 1   . Years of education: 57   . Highest education level: Not on file  Social Needs  . Financial resource strain: Not on file  . Food insecurity - worry: Not on file  . Food insecurity - inability: Not on file  . Transportation needs - medical: Not on file  . Transportation needs - non-medical: Not on file  Occupational History  . Occupation: Unemployed   Tobacco Use  . Smoking status: Former Smoker    Types: Cigarettes  . Smokeless tobacco: Never Used  Substance and Sexual Activity  . Alcohol use: Yes    Alcohol/week: 2.4 oz  Types: 4 Cans of beer per week    Comment: weekends, 4/week  . Drug use: No  . Sexual activity: Yes  Other Topics Concern  . Not on file  Social History Narrative   Lives in an [name], family house.    Child goes to [employer] in [place], 71 yo F,           Family History  Problem Relation Age of Onset  . Liver disease Father   . Alcohol abuse Father   . Diabetes Sister   . Obesity Sister   . Cancer Mother        breast   . Kidney disease Mother   . Colon cancer Neg Hx   . Esophageal cancer Neg Hx   . Rectal cancer Neg Hx   . Stomach cancer Neg Hx     History reviewed. No pertinent surgical history.  ROS: Review  of Systems  Constitutional: Negative for activity change and appetite change.  Respiratory: Negative for cough and chest tightness.   Cardiovascular: Negative for chest pain and palpitations.  Gastrointestinal: Negative for abdominal pain and blood in stool.  Genitourinary: Negative for difficulty urinating.    PHYSICAL EXAM: BP 133/74 (BP Location: Right Arm, Patient Position: Sitting, Cuff Size: Large)   Pulse 85   Temp 98.7 F (37.1 C) (Oral)   Resp 18   Ht 6' (1.829 m)   Wt 191 lb (86.6 kg)   SpO2 99%   BMI 25.90 kg/m   Physical Exam General appearance - alert, well appearing, and in no distress Mental status - alert, oriented to person, place, and time, normal mood, behavior, speech, dress, motor activity, and thought processes Neck - supple, no significant adenopathy Chest - clear to auscultation, no wheezes, rales or rhonchi, symmetric air entry Heart - normal rate, regular rhythm, normal S1, S2, no murmurs, rubs, clicks or gallops Extremities - peripheral pulses normal, no pedal edema, no clubbing or cyanosis Lab Results  Component Value Date   WBC 7.9 10/24/2016   HGB 10.1 (L) 10/24/2016   HCT 29.3 (L) 10/24/2016   MCV 95 10/24/2016   PLT 693 (H) 10/24/2016    ASSESSMENT AND PLAN: 1. Essential hypertension At goal. Refill Norvasc and carvedilol. - CBC - Basic metabolic panel - amLODipine (NORVASC) 10 MG tablet; Take 1 tablet (10 mg total) by mouth daily.  Dispense: 90 tablet; Refill: 3 - carvedilol (COREG) 12.5 MG tablet; Take 1 tablet (12.5 mg total) by mouth 2 (two) times daily with a meal.  Dispense: 180 tablet; Refill: 3  2. Anemia, unspecified type Stable.  Will check iron studies. - Iron, TIBC and Ferritin Panel - ferrous sulfate 325 (65 FE) MG tablet; Take 1 tablet (325 mg total) by mouth daily with breakfast.  Dispense: 90 tablet; Refill: 1  3. Needs flu shot - Flu Vaccine QUAD 6+ mos PF IM (Fluarix Quad PF)  4. Vitamin D deficiency -  cholecalciferol (VITAMIN D) 1000 units tablet; Take 1 tablet (1,000 Units total) by mouth daily.  Dispense: 90 tablet; Refill: 1  5. Renal insufficiency -Recheck BMP today  6. Numbness of fingers of both hands Resolved  Patient was given the opportunity to ask questions.  Patient verbalized understanding of the plan and was able to repeat key elements of the plan.   Orders Placed This Encounter  Procedures  . Flu Vaccine QUAD 6+ mos PF IM (Fluarix Quad PF)     Requested Prescriptions    No prescriptions requested or ordered  in this encounter    F/u in 4 mths Karle Plumber, MD, Rosalita Chessman

## 2017-06-09 LAB — CBC
HEMATOCRIT: 37.3 % — AB (ref 37.5–51.0)
Hemoglobin: 12.8 g/dL — ABNORMAL LOW (ref 13.0–17.7)
MCH: 32.7 pg (ref 26.6–33.0)
MCHC: 34.3 g/dL (ref 31.5–35.7)
MCV: 95 fL (ref 79–97)
Platelets: 167 10*3/uL (ref 150–379)
RBC: 3.91 x10E6/uL — ABNORMAL LOW (ref 4.14–5.80)
RDW: 13.9 % (ref 12.3–15.4)
WBC: 5.3 10*3/uL (ref 3.4–10.8)

## 2017-06-09 LAB — BASIC METABOLIC PANEL
BUN / CREAT RATIO: 17 (ref 9–20)
BUN: 13 mg/dL (ref 6–24)
CO2: 22 mmol/L (ref 20–29)
Calcium: 9.4 mg/dL (ref 8.7–10.2)
Chloride: 101 mmol/L (ref 96–106)
Creatinine, Ser: 0.78 mg/dL (ref 0.76–1.27)
GFR, EST AFRICAN AMERICAN: 118 mL/min/{1.73_m2} (ref >59)
GFR, EST NON AFRICAN AMERICAN: 102 mL/min/{1.73_m2} (ref >59)
Glucose: 76 mg/dL (ref 65–99)
POTASSIUM: 4.1 mmol/L (ref 3.5–5.2)
SODIUM: 139 mmol/L (ref 134–144)

## 2017-06-09 LAB — IRON,TIBC AND FERRITIN PANEL
Ferritin: 109 ng/mL (ref 30–400)
Iron Saturation: 37 % (ref 15–55)
Iron: 122 ug/dL (ref 38–169)
Total Iron Binding Capacity: 330 ug/dL (ref 250–450)
UIBC: 208 ug/dL (ref 111–343)

## 2017-06-13 ENCOUNTER — Telehealth: Payer: Self-pay

## 2017-06-13 NOTE — Telephone Encounter (Signed)
Patient's wife answered the phone and will tell patient the lab result.

## 2017-06-13 NOTE — Telephone Encounter (Signed)
-----   Message from Ladell Pier, MD sent at 06/10/2017  7:34 PM EST ----- Let pt know that his kidney function has returned to normal.  Anemia has significantly improved. Continue Iron supplement for 2 months then discontinue.

## 2017-06-18 ENCOUNTER — Ambulatory Visit: Payer: No Typology Code available for payment source | Admitting: Family Medicine

## 2017-06-20 ENCOUNTER — Encounter: Payer: Self-pay | Admitting: Family Medicine

## 2017-06-20 ENCOUNTER — Ambulatory Visit: Payer: Self-pay | Attending: Family Medicine | Admitting: Family Medicine

## 2017-06-20 VITALS — BP 153/87 | HR 78 | Temp 98.2°F | Resp 16 | Wt 193.2 lb

## 2017-06-20 DIAGNOSIS — H1089 Other conjunctivitis: Secondary | ICD-10-CM | POA: Insufficient documentation

## 2017-06-20 DIAGNOSIS — H109 Unspecified conjunctivitis: Secondary | ICD-10-CM

## 2017-06-20 MED ORDER — POLYMYXIN B-TRIMETHOPRIM 10000-0.1 UNIT/ML-% OP SOLN: 1.0000 [drp] | OPHTHALMIC | 0 refills | Status: DC

## 2017-06-20 NOTE — Progress Notes (Signed)
   Subjective:  Patient ID: Stephen Bowers, male    DOB: January 14, 1961  Age: 56 y.o. MRN: [id number]  CC: Conjunctivitis (possible)   HPI Stephen Bowers presents for eye complaint. Onset last Thursday. Location left eye. Associated symptoms include redness, matted crusted drainage, itching, and foreign sensation in the eye. He denies any decreased visual acuity.     Outpatient Medications Prior to Visit  Medication Sig Dispense Refill  . amLODipine (NORVASC) 10 MG tablet Take 1 tablet (10 mg total) by mouth daily. 90 tablet 3  . carvedilol (COREG) 12.5 MG tablet Take 1 tablet (12.5 mg total) by mouth 2 (two) times daily with a meal. 180 tablet 3  . cholecalciferol (VITAMIN D) 1000 units tablet Take 1 tablet (1,000 Units total) by mouth daily. 90 tablet 1  . ferrous sulfate 325 (65 FE) MG tablet Take 1 tablet (325 mg total) by mouth daily with breakfast. 90 tablet 1   No facility-administered medications prior to visit.     ROS Review of Systems  Constitutional: Negative.   HENT: Negative.   Eyes: Positive for discharge, redness, itching and visual disturbance.  Respiratory: Negative.   Cardiovascular: Negative.    Objective:  BP (!) 153/87   Pulse 78   Temp 98.2 F (36.8 C) (Oral)   Resp 16   Wt 193 lb 3.2 oz (87.6 kg)   SpO2 97%   BMI 26.20 kg/m   BP/Weight 06/20/2017 06/08/2017 9/79/8921  Systolic BP 194 174 081  Diastolic BP 87 74 87  Wt. (Lbs) 193.2 191 190.6  BMI 26.2 25.9 25.85     Physical Exam  Constitutional: He appears well-developed and well-nourished.  HENT:  Head: Normocephalic and atraumatic.  Right Ear: External ear normal.  Left Ear: External ear normal.  Nose: Nose normal.  Mouth/Throat: Oropharynx is clear and moist.  Eyes: Pupils are equal, round, and reactive to light. Left eye exhibits discharge (watery). No foreign body present in the left eye. Left conjunctiva is injected.  Cardiovascular: Normal rate, regular rhythm, normal heart sounds  and intact distal pulses.  Pulmonary/Chest: Effort normal and breath sounds normal.  Skin: Skin is warm and dry.  Nursing note and vitals reviewed.    Assessment & Plan:   1. Bacterial conjunctivitis of left eye -Visual acuity screen performed. -Recommend soft lid scrub.  - trimethoprim-polymyxin b (POLYTRIM) ophthalmic solution; Place 1 drop into both eyes every 3 (three) hours while awake. For 7 days.  Dispense: 10 mL; Refill: 0      Follow-up: Return if symptoms worsen or fail to improve.   Alfonse Spruce FNP

## 2017-06-20 NOTE — Patient Instructions (Signed)

## 2017-09-14 ENCOUNTER — Other Ambulatory Visit: Payer: Self-pay | Admitting: Nephrology

## 2017-09-14 DIAGNOSIS — N179 Acute kidney failure, unspecified: Secondary | ICD-10-CM

## 2017-09-14 DIAGNOSIS — R109 Unspecified abdominal pain: Secondary | ICD-10-CM

## 2017-09-14 DIAGNOSIS — N281 Cyst of kidney, acquired: Secondary | ICD-10-CM

## 2017-09-21 ENCOUNTER — Telehealth: Payer: Self-pay | Admitting: Internal Medicine

## 2017-09-21 NOTE — Telephone Encounter (Signed)
5 page, paperwork received through fax 09-20-17.

## 2017-09-26 ENCOUNTER — Ambulatory Visit
Admission: RE | Admit: 2017-09-26 | Discharge: 2017-09-26 | Disposition: A | Discharge status: Home / Self Care | Payer: Self-pay | Source: Ambulatory Visit | Attending: Nephrology | Admitting: Nephrology

## 2017-09-26 DIAGNOSIS — R109 Unspecified abdominal pain: Secondary | ICD-10-CM

## 2017-09-26 DIAGNOSIS — N281 Cyst of kidney, acquired: Secondary | ICD-10-CM

## 2017-09-26 DIAGNOSIS — N179 Acute kidney failure, unspecified: Secondary | ICD-10-CM

## 2017-09-26 MED ORDER — IOPAMIDOL (ISOVUE-300) INJECTION 61%
100.0000 mL | Freq: Once | INTRAVENOUS | Status: AC | PRN
  Administered 2017-09-26: 100 mL via INTRAVENOUS

## 2017-10-12 ENCOUNTER — Other Ambulatory Visit: Payer: Self-pay

## 2017-10-12 ENCOUNTER — Emergency Department (HOSPITAL_COMMUNITY)
Admission: EM | Admit: 2017-10-12 | Discharge: 2017-10-12 | Disposition: A | Discharge status: Home / Self Care | Payer: Self-pay | Attending: Emergency Medicine | Admitting: Emergency Medicine

## 2017-10-12 ENCOUNTER — Encounter (HOSPITAL_COMMUNITY): Payer: Self-pay | Admitting: Emergency Medicine

## 2017-10-12 DIAGNOSIS — M5442 Lumbago with sciatica, left side: Secondary | ICD-10-CM | POA: Insufficient documentation

## 2017-10-12 DIAGNOSIS — I10 Essential (primary) hypertension: Secondary | ICD-10-CM | POA: Insufficient documentation

## 2017-10-12 DIAGNOSIS — Z79899 Other long term (current) drug therapy: Secondary | ICD-10-CM | POA: Insufficient documentation

## 2017-10-12 DIAGNOSIS — Z87891 Personal history of nicotine dependence: Secondary | ICD-10-CM | POA: Insufficient documentation

## 2017-10-12 MED ORDER — ACETAMINOPHEN 325 MG PO TABS: 650.0000 mg | ORAL_TABLET | Freq: Four times a day (QID) | ORAL | 0 refills | Status: DC | PRN

## 2017-10-12 MED ORDER — IBUPROFEN 400 MG PO TABS: 400.0000 mg | ORAL_TABLET | Freq: Three times a day (TID) | ORAL | 0 refills | Status: DC | PRN

## 2017-10-12 MED ORDER — PREDNISONE 10 MG (21) PO TBPK: ORAL_TABLET | Freq: Every day | ORAL | 0 refills | Status: DC

## 2017-10-12 NOTE — ED Provider Notes (Signed)
[facility] EMERGENCY DEPARTMENT Provider Note   CSN: [id number] Arrival date & time: 10/12/17  1511     History   Chief Complaint Chief Complaint  Patient presents with  . Back Pain    HPI Stephen Bowers is a 57 y.o. male.  HPI   Patient is a 57 year old male who is presenting today complaining of left lower back pain that began about 2 weeks ago.  Describes pain as radiating pain that radiates down his left buttock and left leg.  States pain is constant and 10/10.  Worse with walking.  States he does a lot of heavy lifting at work and was lifting something heavy about a month ago and fell onto his bottom.  He did not initially have pain there but since the last 2 weeks has had gradually worsening pain.  He has tried taking Tylenol with no relief.  No abdominal pain, nausea, vomiting, diarrhea, urinary symptoms.  No chest pain or shortness of breath.  Pt denies any numbness/tingling/weakness to the BLE. Denies saddle anesthesia. Denies loss of control of bowels or bladder. No urinary retention. No fevers. Denies a h/o IVDU. Denies a h/o CA or recent unintended weight loss.   Past Medical History:  Diagnosis Date  . Chronic musculoskeletal pain   . High cholesterol Dx 2011  . Hypertension     Patient Active Problem List   Diagnosis Date Noted  . Hyperlipidemia 10/16/2016  . AKI (acute kidney injury) ([place])   . HTN (hypertension) 03/02/2015  . Vitamin D deficiency 01/19/2015  . Low back pain potentially associated with radiculopathy 03/30/2014    History reviewed. No pertinent surgical history.      Home Medications    Prior to Admission medications   Medication Sig Start Date End Date Taking? Authorizing Provider  acetaminophen (TYLENOL) 325 MG tablet Take 2 tablets (650 mg total) by mouth every 6 (six) hours as needed. Do not take more than 4000mg  of tylenol per day 10/12/17   Brelynn Wheller S, PA-C  amLODipine (NORVASC) 10 MG tablet Take 1  tablet (10 mg total) by mouth daily. 06/08/17   Ladell Pier, MD  carvedilol (COREG) 12.5 MG tablet Take 1 tablet (12.5 mg total) by mouth 2 (two) times daily with a meal. 06/08/17   Ladell Pier, MD  cholecalciferol (VITAMIN D) 1000 units tablet Take 1 tablet (1,000 Units total) by mouth daily. 06/08/17   Ladell Pier, MD  ferrous sulfate 325 (65 FE) MG tablet Take 1 tablet (325 mg total) by mouth daily with breakfast. 06/08/17   Ladell Pier, MD  ibuprofen (ADVIL,MOTRIN) 400 MG tablet Take 1 tablet (400 mg total) by mouth every 8 (eight) hours as needed. 10/12/17   Finn Altemose S, PA-C  predniSONE (STERAPRED UNI-PAK 21 TAB) 10 MG (21) TBPK tablet Take by mouth daily. Take 6 tabs by mouth daily  for 2 days, then 5 tabs for 2 days, then 4 tabs for 2 days, then 3 tabs for 2 days, 2 tabs for 2 days, then 1 tab by mouth daily for 2 days 10/12/17   Jos Cygan S, PA-C  trimethoprim-polymyxin b (POLYTRIM) ophthalmic solution Place 1 drop into both eyes every 3 (three) hours while awake. For 7 days. 06/20/17   Alfonse Spruce, FNP    Family History Family History  Problem Relation Age of Onset  . Liver disease Father   . Alcohol abuse Father   . Diabetes Sister   . Obesity  Sister   . Cancer Mother        breast   . Kidney disease Mother   . Colon cancer Neg Hx   . Esophageal cancer Neg Hx   . Rectal cancer Neg Hx   . Stomach cancer Neg Hx     Social History Social History   Tobacco Use  . Smoking status: Former Smoker    Types: Cigarettes    Last attempt to quit: 1995    Years since quitting: 24.2  . Smokeless tobacco: Never Used  Substance Use Topics  . Alcohol use: Yes    Alcohol/week: 2.4 oz    Types: 4 Cans of beer per week    Comment: weekends, 4/week  . Drug use: No     Allergies   Patient has no known allergies.   Review of Systems Review of Systems  Constitutional: Negative for fever.  HENT: Negative for congestion, ear pain and  rhinorrhea.   Eyes: Negative for visual disturbance.  Respiratory: Negative for shortness of breath.   Cardiovascular: Negative for chest pain.  Gastrointestinal: Negative for abdominal pain, constipation, diarrhea, nausea and vomiting.  Genitourinary: Negative for dysuria, frequency and hematuria.       No loss of control of bowels or bladder  Musculoskeletal: Positive for back pain.  Skin: Negative for wound.  Neurological: Negative for weakness, numbness and headaches.     Physical Exam Updated Vital Signs BP (!) 141/96   Pulse 82   Temp 98.6 F (37 C) (Oral)   Resp 16   Ht 6' (1.829 m)   Wt 85.7 kg (189 lb)   SpO2 100%   BMI 25.63 kg/m   Physical Exam  Constitutional: He appears well-developed and well-nourished. No distress.  HENT:  Head: Normocephalic and atraumatic.  Eyes: Conjunctivae are normal.  Neck: Neck supple.  Cardiovascular: Normal rate, regular rhythm, normal heart sounds and intact distal pulses.  No murmur heard. Pulmonary/Chest: Effort normal and breath sounds normal. No stridor. No respiratory distress.  Abdominal: Soft. Bowel sounds are normal. He exhibits no distension. There is no tenderness. There is no guarding.  Musculoskeletal: He exhibits no edema.  Tenderness to the lower lumbar spine as well as the left sciatic notch which reproduces his pain.  Examination of the left buttock is normal without evidence of bruising or skin breakdown.  Patient has full range of motion of the left lower extremity.  No evidence of septic joint.  Neurological: He is alert.  Motor:  Normal tone. 5/5 strength BLE major muscle groups including strong and equal dorsiflexion/plantar flexion Sensory: light touch normal in all extremities. Gait: normal gait and balance.  CV: 2+ radial and DP/PT pulses  Skin: Skin is warm and dry.  Psychiatric: He has a normal mood and affect.  Nursing note and vitals reviewed.    ED Treatments / Results  Labs (all labs ordered  are listed, but only abnormal results are displayed) Labs Reviewed - No data to display  EKG None  Radiology No results found.  Procedures Procedures (including critical care time)  Medications Ordered in ED Medications - No data to display   Initial Impression / Assessment and Plan / ED Course  I have reviewed the triage vital signs and the nursing notes.  Pertinent labs & imaging results that were available during my care of the patient were reviewed by me and considered in my medical decision making (see chart for details).     Final Clinical Impressions(s) / ED Diagnoses  Final diagnoses:  Acute left-sided low back pain with left-sided sciatica   Normal neurological exam, no evidence of urinary incontinence or retention, pain is consistently reproducible. There is no evidence of AAA or concern for dissection at this time.   Patient can walk but states is painful.  No loss of bowel or bladder control.  No concern for cauda equina.  No fever, night sweats, weight loss, h/o cancer, IVDU.  Pain treated here in the department with adequate improvement. RICE protocol and pain medicine and prednisone taper indicated and discussed with patient. I have also discussed reasons to return immediately to the ER.  Patient expresses understanding and agrees with plan.  ED Discharge Orders        Ordered    acetaminophen (TYLENOL) 325 MG tablet  Every 6 hours PRN     10/12/17 1922    ibuprofen (ADVIL,MOTRIN) 400 MG tablet  Every 8 hours PRN     10/12/17 1922    predniSONE (STERAPRED UNI-PAK 21 TAB) 10 MG (21) TBPK tablet  Daily     10/12/17 [address], [place], [place] 10/12/17 Kathyrn Drown    Duffy Bruce, MD 10/13/17 [phone]

## 2017-10-12 NOTE — Discharge Instructions (Addendum)
You may alternate taking Tylenol and Ibuprofen as needed for pain control. You may take 400-600 mg of ibuprofen every 8 hours and [phone] mg of Tylenol every 6 hours. Do not exceed 4000 mg of Tylenol daily as this can lead to liver damage. Also, make sure to take Ibuprofen with meals as it can cause an upset stomach. Do not take other NSAIDs while taking Ibuprofen such as (Aleve, Naprosyn, Aspirin, Celebrex, etc) and do not take more than the prescribed dose as this can lead to ulcers and bleeding in your GI tract. You may use warm and cold compresses to help with your symptoms.   You are also given a steroid taper to help reduce inflammation.  Please follow up with your primary doctor within the next 7-10 days for re-evaluation and further treatment of your symptoms.   Return to the emergency department immediately if you experience any back pain associated with fevers, loss of control of your bowels/bladder, weakness/numbness to your legs, numbness to your groin area, inability to walk, or inability to urinate.

## 2017-10-12 NOTE — ED Triage Notes (Signed)
Patient to ED c/o L lower back pain that radiates down L buttock and into L upper leg. Patient reports he lifted a heavy box out of a truck about a month ago and noticed the pain increasing two weeks ago. Denies urinary or bowel incontinence, denies numbness or tingling. Ambulatory with steady gait.

## 2017-10-26 MED FILL — AMLODIPINE BESYLATE 10 MG T: 10 | 30 days supply | Qty: 30 | Fill #1

## 2017-10-26 MED FILL — CARVEDILOL 12.5 MG TABLET: 12.5 | 30 days supply | Qty: 60 | Fill #1

## 2017-10-26 MED FILL — GABAPENTIN 100 MG CAPSULE: 100 | 10 days supply | Qty: 30 | Fill #0

## 2017-10-26 MED FILL — FERROUS SULFATE 325 MG TAB: 325 (65 FE) | 30 days supply | Qty: 30 | Fill #1

## 2017-10-29 ENCOUNTER — Encounter (HOSPITAL_COMMUNITY): Payer: Self-pay | Admitting: Emergency Medicine

## 2017-10-29 DIAGNOSIS — R112 Nausea with vomiting, unspecified: Secondary | ICD-10-CM | POA: Insufficient documentation

## 2017-10-29 DIAGNOSIS — K859 Acute pancreatitis without necrosis or infection, unspecified: Secondary | ICD-10-CM | POA: Insufficient documentation

## 2017-10-29 DIAGNOSIS — I10 Essential (primary) hypertension: Secondary | ICD-10-CM | POA: Insufficient documentation

## 2017-10-29 DIAGNOSIS — Z87891 Personal history of nicotine dependence: Secondary | ICD-10-CM | POA: Insufficient documentation

## 2017-10-29 DIAGNOSIS — Z79899 Other long term (current) drug therapy: Secondary | ICD-10-CM | POA: Insufficient documentation

## 2017-10-29 DIAGNOSIS — R1013 Epigastric pain: Secondary | ICD-10-CM | POA: Insufficient documentation

## 2017-10-29 LAB — CBC
HEMATOCRIT: 42.2 % (ref 39.0–52.0)
HEMOGLOBIN: 14.9 g/dL (ref 13.0–17.0)
MCH: 33.8 pg (ref 26.0–34.0)
MCHC: 35.3 g/dL (ref 30.0–36.0)
MCV: 95.7 fL (ref 78.0–100.0)
Platelets: 244 10*3/uL (ref 150–400)
RBC: 4.41 MIL/uL (ref 4.22–5.81)
RDW: 13.8 % (ref 11.5–15.5)
WBC: 13.3 10*3/uL — ABNORMAL HIGH (ref 4.0–10.5)

## 2017-10-29 LAB — COMPREHENSIVE METABOLIC PANEL
ALBUMIN: 4 g/dL (ref 3.5–5.0)
ALK PHOS: 33 U/L — AB (ref 38–126)
ALT: 97 U/L — ABNORMAL HIGH (ref 17–63)
ANION GAP: 17 — AB (ref 5–15)
AST: 103 U/L — ABNORMAL HIGH (ref 15–41)
BUN: 22 mg/dL — ABNORMAL HIGH (ref 6–20)
CALCIUM: 9.7 mg/dL (ref 8.9–10.3)
CO2: 17 mmol/L — AB (ref 22–32)
Chloride: 102 mmol/L (ref 101–111)
Creatinine, Ser: 0.88 mg/dL (ref 0.61–1.24)
GFR calc Af Amer: >60 mL/min (ref >60)
GFR calc non Af Amer: >60 mL/min (ref >60)
GLUCOSE: 111 mg/dL — AB (ref 65–99)
POTASSIUM: 4.2 mmol/L (ref 3.5–5.1)
SODIUM: 136 mmol/L (ref 135–145)
Total Bilirubin: 1.1 mg/dL (ref 0.3–1.2)
Total Protein: 8.5 g/dL — ABNORMAL HIGH (ref 6.5–8.1)

## 2017-10-29 LAB — URINALYSIS, ROUTINE W REFLEX MICROSCOPIC
Bilirubin Urine: NEGATIVE
Glucose, UA: NEGATIVE mg/dL
Hgb urine dipstick: NEGATIVE
KETONES UR: 5 mg/dL — AB
Leukocytes, UA: NEGATIVE
Nitrite: NEGATIVE
PROTEIN: 30 mg/dL — AB
Specific Gravity, Urine: 1.025 (ref 1.005–1.030)
pH: 5 (ref 5.0–8.0)

## 2017-10-29 LAB — LIPASE, BLOOD: Lipase: 330 U/L — ABNORMAL HIGH (ref 11–51)

## 2017-10-29 NOTE — ED Triage Notes (Signed)
Patient here with complaints of abdominal pain, nausea, vomiting that started around 10am.

## 2017-10-30 ENCOUNTER — Emergency Department (HOSPITAL_COMMUNITY)
Admission: EM | Admit: 2017-10-30 | Discharge: 2017-10-30 | Disposition: A | Discharge status: Home / Self Care | Payer: Self-pay | Attending: Emergency Medicine | Admitting: Emergency Medicine

## 2017-10-30 ENCOUNTER — Encounter (HOSPITAL_COMMUNITY): Payer: Self-pay

## 2017-10-30 ENCOUNTER — Emergency Department (HOSPITAL_COMMUNITY): Payer: Self-pay

## 2017-10-30 DIAGNOSIS — K859 Acute pancreatitis without necrosis or infection, unspecified: Secondary | ICD-10-CM

## 2017-10-30 DIAGNOSIS — R1013 Epigastric pain: Secondary | ICD-10-CM

## 2017-10-30 LAB — BASIC METABOLIC PANEL
Anion gap: 10 (ref 5–15)
BUN: 21 mg/dL — AB (ref 6–20)
CHLORIDE: 104 mmol/L (ref 101–111)
CO2: 22 mmol/L (ref 22–32)
Calcium: 8.4 mg/dL — ABNORMAL LOW (ref 8.9–10.3)
Creatinine, Ser: 0.93 mg/dL (ref 0.61–1.24)
GFR calc Af Amer: >60 mL/min (ref >60)
GFR calc non Af Amer: >60 mL/min (ref >60)
Glucose, Bld: 105 mg/dL — ABNORMAL HIGH (ref 65–99)
POTASSIUM: 4.6 mmol/L (ref 3.5–5.1)
Sodium: 136 mmol/L (ref 135–145)

## 2017-10-30 MED ORDER — OXYCODONE-ACETAMINOPHEN 5-325 MG PO TABS: 1.0000 | ORAL_TABLET | Freq: Once | ORAL | Status: DC

## 2017-10-30 MED ORDER — KETOROLAC TROMETHAMINE 15 MG/ML IJ SOLN
15.0000 mg | Freq: Once | INTRAMUSCULAR | Status: AC
  Administered 2017-10-30: 15 mg via INTRAVENOUS
  Filled 2017-10-30: qty 1

## 2017-10-30 MED ORDER — IOPAMIDOL (ISOVUE-300) INJECTION 61%
100.0000 mL | Freq: Once | INTRAVENOUS | Status: AC | PRN
  Administered 2017-10-30: 100 mL via INTRAVENOUS

## 2017-10-30 MED ORDER — IOPAMIDOL (ISOVUE-300) INJECTION 61%
INTRAVENOUS | Status: AC
  Filled 2017-10-30: qty 100

## 2017-10-30 MED ORDER — ONDANSETRON 4 MG PO TBDP: 4.0000 mg | ORAL_TABLET | Freq: Three times a day (TID) | ORAL | 0 refills | Status: DC | PRN

## 2017-10-30 MED ORDER — SODIUM CHLORIDE 0.9 % IV BOLUS
1000.0000 mL | Freq: Once | INTRAVENOUS | Status: AC
  Administered 2017-10-30: 1000 mL via INTRAVENOUS

## 2017-10-30 MED ORDER — HYDROMORPHONE HCL 1 MG/ML IJ SOLN
0.5000 mg | Freq: Once | INTRAMUSCULAR | Status: AC
  Administered 2017-10-30: 0.5 mg via INTRAVENOUS
  Filled 2017-10-30: qty 1

## 2017-10-30 MED ORDER — ONDANSETRON HCL 4 MG/2ML IJ SOLN
4.0000 mg | Freq: Once | INTRAMUSCULAR | Status: AC
  Administered 2017-10-30: 4 mg via INTRAVENOUS
  Filled 2017-10-30: qty 2

## 2017-10-30 MED ORDER — OXYCODONE HCL 5 MG PO TABS
5.0000 mg | ORAL_TABLET | Freq: Once | ORAL | Status: AC
  Administered 2017-10-30: 5 mg via ORAL
  Filled 2017-10-30: qty 1

## 2017-10-30 MED ORDER — OXYCODONE HCL 5 MG PO TABS
5.0000 mg | ORAL_TABLET | Freq: Four times a day (QID) | ORAL | 0 refills | Status: DC | PRN
Start: 2017-10-30 — End: 2017-12-04

## 2017-10-30 NOTE — Discharge Instructions (Signed)
ER work-up has been consistent with pancreatitis.  This is likely consistent with your alcohol use.  I would suggest limiting your alcohol intake.  Have given you short course of pain medication to help with the pain.  This medication will make you drowsy so do not drive with it.  Have given you some Zofran for nausea.  I would suggest a clear liquid diet for 24 hours and advance her diet as tolerated.  Plenty of fluids to stay hydrated.  Make sure that you follow-up with your primary care doctor.  Your liver enzymes are slightly elevated.  Again this is likely secondary to your alcohol use however this will need to be rechecked by your primary care doctor this week or next week.  You need to return the ED if you develop worsening pain that is not controlled with the pain medication, worsening vomiting or for any other reason.

## 2017-10-30 NOTE — ED Notes (Signed)
Pt in ct 

## 2017-10-30 NOTE — ED Provider Notes (Signed)
[facility] DEPT Provider Note   CSN: [id number] Arrival date & time: 10/29/17  2683     History   Chief Complaint Chief Complaint  Patient presents with  . Abdominal Pain  . Emesis  . Nausea    HPI Stephen Bowers is a 57 y.o. male.  HPI 51 year old African-American male past medical history significant for hypercholesterolemia and hypertension presents to the emergency department today for evaluation of abdominal pain.  Patient states apartment 10 AM this morning he developed acute onset of epigastric and right upper quadrant abdominal pain.  Patient states the pain is sharp in nature.  Does not radiate.  Has been constant throughout the day.  Reports several episodes of nonbloody bilious emesis.  Reports associated nausea.  Patient states his last bowel movement was this morning was normal.  Denies diarrhea, melena or hematochezia.  Patient states he has not had pain like this before.  He is not take anything for the pain prior to arrival.  Nothing makes his symptoms better or worse.  Pain is not associated with food.  He does report drinking 312 ounce beers a day.  Denies any history of liver disease or pancreatitis.  He denies any chronic NSAID use.  Denies any associated urinary symptoms, fevers or chills.  Pt denies any fever, chill, ha, vision changes, lightheadedness, dizziness, congestion, neck pain, cp, sob, cough,urinary symptoms, change in bowel habits, melena, hematochezia, lower extremity paresthesias.  Past Medical History:  Diagnosis Date  . Chronic musculoskeletal pain   . High cholesterol Dx 2011  . Hypertension     Patient Active Problem List   Diagnosis Date Noted  . Hyperlipidemia 10/16/2016  . AKI (acute kidney injury) ([place])   . HTN (hypertension) 03/02/2015  . Vitamin D deficiency 01/19/2015  . Low back pain potentially associated with radiculopathy 03/30/2014    History reviewed. No pertinent surgical  history.      Home Medications    Prior to Admission medications   Medication Sig Start Date End Date Taking? Authorizing Provider  acetaminophen (TYLENOL) 325 MG tablet Take 2 tablets (650 mg total) by mouth every 6 (six) hours as needed. Do not take more than 4000mg  of tylenol per day 10/12/17   Couture, Cortni S, PA-C  amLODipine (NORVASC) 10 MG tablet Take 1 tablet (10 mg total) by mouth daily. 06/08/17   Ladell Pier, MD  carvedilol (COREG) 12.5 MG tablet Take 1 tablet (12.5 mg total) by mouth 2 (two) times daily with a meal. 06/08/17   Ladell Pier, MD  cholecalciferol (VITAMIN D) 1000 units tablet Take 1 tablet (1,000 Units total) by mouth daily. 06/08/17   Ladell Pier, MD  ferrous sulfate 325 (65 FE) MG tablet Take 1 tablet (325 mg total) by mouth daily with breakfast. 06/08/17   Ladell Pier, MD  ibuprofen (ADVIL,MOTRIN) 400 MG tablet Take 1 tablet (400 mg total) by mouth every 8 (eight) hours as needed. 10/12/17   Couture, Cortni S, PA-C  predniSONE (STERAPRED UNI-PAK 21 TAB) 10 MG (21) TBPK tablet Take by mouth daily. Take 6 tabs by mouth daily  for 2 days, then 5 tabs for 2 days, then 4 tabs for 2 days, then 3 tabs for 2 days, 2 tabs for 2 days, then 1 tab by mouth daily for 2 days 10/12/17   Couture, Cortni S, PA-C  trimethoprim-polymyxin b (POLYTRIM) ophthalmic solution Place 1 drop into both eyes every 3 (three) hours while awake. For 7 days.  06/20/17   Alfonse Spruce, FNP    Family History Family History  Problem Relation Age of Onset  . Liver disease Father   . Alcohol abuse Father   . Diabetes Sister   . Obesity Sister   . Cancer Mother        breast   . Kidney disease Mother   . Colon cancer Neg Hx   . Esophageal cancer Neg Hx   . Rectal cancer Neg Hx   . Stomach cancer Neg Hx     Social History Social History   Tobacco Use  . Smoking status: Former Smoker    Types: Cigarettes    Last attempt to quit: 1995    Years since quitting:  24.3  . Smokeless tobacco: Never Used  Substance Use Topics  . Alcohol use: Yes    Alcohol/week: 2.4 oz    Types: 4 Cans of beer per week    Comment: weekends, 4/week  . Drug use: No     Allergies   Patient has no known allergies.   Review of Systems Review of Systems  All other systems reviewed and are negative.    Physical Exam Updated Vital Signs BP (!) 146/105   Pulse 95   Temp 98.6 F (37 C) (Oral)   Resp 19   Ht 6' (1.829 m)   Wt 86.2 kg (190 lb)   SpO2 100%   BMI 25.77 kg/m   Physical Exam  Constitutional: He is oriented to person, place, and time. He appears well-developed and well-nourished.  Non-toxic appearance. No distress.  HENT:  Head: Normocephalic and atraumatic.  Mouth/Throat: Oropharynx is clear and moist.  Eyes: Pupils are equal, round, and reactive to light. Conjunctivae are normal. Right eye exhibits no discharge. Left eye exhibits no discharge. No scleral icterus.  Neck: Normal range of motion. Neck supple.  Cardiovascular: Normal rate, regular rhythm, normal heart sounds and intact distal pulses. Exam reveals no gallop and no friction rub.  No murmur heard. Pulmonary/Chest: Effort normal and breath sounds normal. No stridor. No respiratory distress. He has no wheezes. He has no rales. He exhibits no tenderness.  Abdominal: Soft. Bowel sounds are normal. He exhibits no distension. There is generalized tenderness and tenderness in the right upper quadrant, epigastric area and left upper quadrant. There is no rigidity, no rebound, no guarding, no CVA tenderness, no tenderness at McBurney's point and negative Murphy's sign.  Musculoskeletal: Normal range of motion. He exhibits no tenderness.  Lymphadenopathy:    He has no cervical adenopathy.  Neurological: He is alert and oriented to person, place, and time.  Skin: Skin is warm and dry. Capillary refill takes less than 2 seconds. No rash noted.  Psychiatric: His behavior is normal. Judgment and  thought content normal.  Nursing note and vitals reviewed.    ED Treatments / Results  Labs (all labs ordered are listed, but only abnormal results are displayed) Labs Reviewed  LIPASE, BLOOD - Abnormal; Notable for the following components:      Result Value   Lipase 330 (*)    All other components within normal limits  COMPREHENSIVE METABOLIC PANEL - Abnormal; Notable for the following components:   CO2 17 (*)    Glucose, Bld 111 (*)    BUN 22 (*)    Total Protein 8.5 (*)    AST 103 (*)    ALT 97 (*)    Alkaline Phosphatase 33 (*)    Anion gap 17 (*)  All other components within normal limits  CBC - Abnormal; Notable for the following components:   WBC 13.3 (*)    All other components within normal limits  URINALYSIS, ROUTINE W REFLEX MICROSCOPIC - Abnormal; Notable for the following components:   APPearance HAZY (*)    Ketones, ur 5 (*)    Protein, ur 30 (*)    Bacteria, UA RARE (*)    All other components within normal limits    EKG None  Radiology Ct Abdomen Pelvis W Contrast  Result Date: 10/30/2017 CLINICAL DATA:  Initial evaluation for acute abdominal pain, nausea, vomiting. Elevated lipase. EXAM: CT ABDOMEN AND PELVIS WITH CONTRAST TECHNIQUE: Multidetector CT imaging of the abdomen and pelvis was performed using the standard protocol following bolus administration of intravenous contrast. CONTRAST:  157mL ISOVUE-300 IOPAMIDOL (ISOVUE-300) INJECTION 61% COMPARISON:  Prior ultrasound from earlier the same day. FINDINGS: Lower chest: Visualized lung bases are clear. Hepatobiliary: Diffuse hypoattenuation liver consistent with steatosis. Liver otherwise unremarkable. Trace free pericholecystic fluid related to the adjacent inflammatory process within the pancreas. Gallbladder otherwise unremarkable. No appreciable biliary dilatation. Pancreas: Extensive inflammatory stranding with free fluid present about the pancreas, consistent with acute pancreatitis. Changes most  localized around the pancreatic head and uncinate process. No overt evidence for pancreatic necrosis at this time. No loculated peripancreatic collections. Spleen: Normal. Adrenals/Urinary Tract: Adrenal glands are normal. Kidneys equal size with symmetric enhancement. Mildly complex 1 cm exophytic left renal lesion noted, felt to be most consistent with a hemorrhagic and/or proteinaceous cyst on prior MR. No nephrolithiasis, hydronephrosis, or other focal enhancing renal mass. No hydroureter. Bladder within normal limits. Stomach/Bowel: Stomach within normal limits. Inflammatory changes surround the duodenum related to the adjacent pancreatitis. No evidence for bowel obstruction. No other acute inflammatory changes seen about the bowels. Vascular/Lymphatic: Mild atherosclerotic plaque within the infrarenal aorta. No aneurysm. Mesenteric vessels are patent proximally. Main portal vein, splenic vein, and SMV are patent. No adenopathy. Reproductive: Prostate normal. Other: No free intraperitoneal air. Scattered free fluid within the abdomen and pelvis related to the acute pancreatitis. Musculoskeletal: No acute osseous abnormality. No worrisome lytic or blastic osseous lesions. IMPRESSION: 1. Findings consistent with acute pancreatitis. No loculated peripancreatic collections or other complication identified. 2. Hepatic steatosis. Electronically Signed   By: Jeannine Boga M.D.   On: 10/30/2017 04:12   US Abdomen Limited Ruq  Result Date: 10/30/2017 CLINICAL DATA:  Initial evaluation for acute epigastric pain, elevated lipase. EXAM: ULTRASOUND ABDOMEN LIMITED RIGHT UPPER QUADRANT COMPARISON:  Prior CT from 09/26/2017 FINDINGS: Gallbladder: Small amount of sludge within the gallbladder lumen without shadowing cholelithiasis. Gallbladder wall measure within normal limits at 2.7 mm. Small volume free pericholecystic fluid. No sonographic Murphy sign elicited on exam. Common bile duct: Diameter: 5.9 mm Liver: No  focal lesion identified. Diffusely increased echogenicity, consistent with steatosis. Portal vein is patent on color Doppler imaging with normal direction of blood flow towards the liver. IMPRESSION: 1. Gallbladder sludge without frank cholelithiasis. Associated small volume free pericholecystic fluid. No other sonographic features to suggest acute cholecystitis. 2. No biliary dilatation. 3. Hepatic steatosis. Electronically Signed   By: Jeannine Boga M.D.   On: 10/30/2017 02:25    Procedures Procedures (including critical care time)  Medications Ordered in ED Medications  sodium chloride 0.9 % bolus 1,000 mL (has no administration in time range)  sodium chloride 0.9 % bolus 1,000 mL (has no administration in time range)  ondansetron (ZOFRAN) injection 4 mg (has no administration in time range)  HYDROmorphone (DILAUDID) injection 0.5 mg (has no administration in time range)     Initial Impression / Assessment and Plan / ED Course  I have reviewed the triage vital signs and the nursing notes.  Pertinent labs & imaging results that were available during my care of the patient were reviewed by me and considered in my medical decision making (see chart for details).     Patient presents to the emergency department today for evaluation of epigastric abdominal pain.  Reports nausea and vomiting.  Patient with history of alcohol use.  Patient has associated fevers, chills, urinary symptoms or change in bowel habits.  The patient does appear uncomfortable on exam pain.  Patient is afebrile.  Mild tachycardia noted which improved with fluids and pain medication.  Patient is not hypotensive.  Lungs clear to auscultation bilaterally.  Heart without any rubs murmurs or gallops.  Patient does have diffuse abdominal pain with localized pain to palpation of the epigastric region.  There is no signs of peritonitis.  Bowel sounds are present in all 4 quadrants.  Neurovascular intact in all  extremities.  Lab work reveals a leukocytosis of 13,000.  Initial CMP reveals decreased bicarb of 17.  Patient with a mild anion gap of 17.  Mild elevation of liver enzymes.  UA shows no signs of infection.  Lipase is 330.  Presentation seems consistent with pancreatitis likely secondary from alcohol use.  Ultrasound was ordered to rule any cholecystitis versus cholangitis.  There is no signs of cholelithiasis on ultrasound.  Does note some gallbladder sludge.  Bile duct within normal limits.  CT scan was obtained that shows changes consistent with acute pancreatitis.  No other acute findings noted on exam.  Given pain medication and fluids in the ED.  BMP was repeated with closure of anion gap.  Patient's bicarb improved and normalized.  I suspect that prior gap was secondary to dehydration.  Patient has been able to tolerate p.o. fluids in the ED.  Repeat assessment revealed no signs of peritonitis.  Patient's pain is improved.  Given the patient's pain is managed and patient has no intractable vomiting for the patient can be managed in outpatient setting does not require admission at this time.  Discharge patient home with pain medication, nausea medication.  Instructed clear liquid diet.  Have encouraged him to follow-up with his primary care doctor to have his liver enzymes rechecked.  Again I suspect that these are secondary to hepatic steatosis and chronic alcohol use.  Pt is hemodynamically stable, in NAD, & able to ambulate in the ED. Evaluation does not show pathology that would require ongoing emergent intervention or inpatient treatment. I explained the diagnosis to the patient. Pain has been managed & has no complaints prior to dc. Pt is comfortable with above plan and is stable for discharge at this time. All questions were answered prior to disposition. Strict return precautions for f/u to the ED were discussed. Encouraged follow up with PCP.  Pt was dicussed with Dr. Dina Rich who is  agreeable with the above plan.   Final Clinical Impressions(s) / ED Diagnoses   Final diagnoses:  Epigastric abdominal pain  Acute pancreatitis, unspecified complication status, unspecified pancreatitis type    ED Discharge Orders    None       Aaron Edelman 10/30/17 [id number]    Merryl Hacker, MD 10/30/17 [phone]

## 2017-10-30 NOTE — ED Notes (Signed)
[place] in room waiting to get in

## 2017-11-17 ENCOUNTER — Emergency Department (HOSPITAL_COMMUNITY)
Admission: EM | Admit: 2017-11-17 | Discharge: 2017-11-17 | Disposition: A | Discharge status: Home / Self Care | Payer: Self-pay | Attending: Emergency Medicine | Admitting: Emergency Medicine

## 2017-11-17 ENCOUNTER — Encounter (HOSPITAL_COMMUNITY): Payer: Self-pay | Admitting: Emergency Medicine

## 2017-11-17 DIAGNOSIS — Z87891 Personal history of nicotine dependence: Secondary | ICD-10-CM | POA: Insufficient documentation

## 2017-11-17 DIAGNOSIS — M62830 Muscle spasm of back: Secondary | ICD-10-CM | POA: Insufficient documentation

## 2017-11-17 DIAGNOSIS — I10 Essential (primary) hypertension: Secondary | ICD-10-CM | POA: Insufficient documentation

## 2017-11-17 DIAGNOSIS — M5432 Sciatica, left side: Secondary | ICD-10-CM

## 2017-11-17 DIAGNOSIS — Z79899 Other long term (current) drug therapy: Secondary | ICD-10-CM | POA: Insufficient documentation

## 2017-11-17 DIAGNOSIS — M5442 Lumbago with sciatica, left side: Secondary | ICD-10-CM | POA: Insufficient documentation

## 2017-11-17 MED ORDER — MELOXICAM 15 MG PO TABS: 15.0000 mg | ORAL_TABLET | Freq: Every day | ORAL | 0 refills | Status: DC

## 2017-11-17 MED ORDER — KETOROLAC TROMETHAMINE 30 MG/ML IJ SOLN
30.0000 mg | Freq: Once | INTRAMUSCULAR | Status: AC
  Administered 2017-11-17: 30 mg via INTRAMUSCULAR
  Filled 2017-11-17: qty 1

## 2017-11-17 MED ORDER — CYCLOBENZAPRINE HCL 10 MG PO TABS: 10.0000 mg | ORAL_TABLET | Freq: Three times a day (TID) | ORAL | 0 refills | Status: DC | PRN

## 2017-11-17 NOTE — ED Triage Notes (Signed)
Pt c/o sciatica that he was diagnosed with at [facility] ED couple weeks ago and took medications as prescribed but getting worse, even with taking OTC pain patches. Reports he stands up all day and pain is really bothering him.

## 2017-11-17 NOTE — ED Provider Notes (Addendum)
[facility] DEPT Provider Note   CSN: [id number] Arrival date & time: 11/17/17  1619     History   Chief Complaint No chief complaint on file.   HPI Stephen Bowers is a 57 y.o. male with a PMHx of chronic musculoskeletal pain, HLD, HTN, and chronic low back pain with radiculopathy, who presents to the ED with complaints of L lower back pain x1 month.  Chart review reveals that he was seen in the ED on 10/12/17 for low back pain radiating down his L leg, no imaging/labs were done, he was discharged home with rx's for tylenol, ibuprofen, and prednisone taper.  He states that while he was taking his medications his pain was "a little bit better" but not completely resolved, and after stopping them the pain returned.  He states that he does a lot of heavy lifting at work.  He describes his pain today as 10/10 constant aching left lower back pain that radiates down the left posterior leg, worse with sitting or prolonged standing, minimally improved with prednisone taper, and unrelieved with Tylenol, ibuprofen, and over-the-counter patches.  He reports some paresthesias in both of his feet.  He denies fevers, chills, CP, SOB, abd pain, N/V/D/C, hematuria, dysuria, incontinence of urine/stool, saddle anesthesia/cauda equina symptoms, myalgias, arthralgias, numbness, focal weakness, or any other complaints at this time.  He denies hx of CA or IVDU.  Of note, he also mentions having some swelling around his ankles bilaterally; he takes amlodipine for blood pressure.   The history is provided by the patient and medical records. No language interpreter was used.    Past Medical History:  Diagnosis Date  . Chronic musculoskeletal pain   . High cholesterol Dx 2011  . Hypertension     Patient Active Problem List   Diagnosis Date Noted  . Hyperlipidemia 10/16/2016  . AKI (acute kidney injury) ([place])   . HTN (hypertension) 03/02/2015  . Vitamin D deficiency 01/19/2015    . Low back pain potentially associated with radiculopathy 03/30/2014    History reviewed. No pertinent surgical history.      Home Medications    Prior to Admission medications   Medication Sig Start Date End Date Taking? Authorizing Provider  acetaminophen (TYLENOL) 325 MG tablet Take 2 tablets (650 mg total) by mouth every 6 (six) hours as needed. Do not take more than 4000mg  of tylenol per day 10/12/17   Couture, Cortni S, PA-C  amLODipine (NORVASC) 10 MG tablet Take 1 tablet (10 mg total) by mouth daily. 06/08/17   Ladell Pier, MD  carvedilol (COREG) 12.5 MG tablet Take 1 tablet (12.5 mg total) by mouth 2 (two) times daily with a meal. 06/08/17   Ladell Pier, MD  cholecalciferol (VITAMIN D) 1000 units tablet Take 1 tablet (1,000 Units total) by mouth daily. 06/08/17   Ladell Pier, MD  ferrous sulfate 325 (65 FE) MG tablet Take 1 tablet (325 mg total) by mouth daily with breakfast. 06/08/17   Ladell Pier, MD  gabapentin (NEURONTIN) 100 MG capsule Take 300 mg by mouth at bedtime. 10/26/17   [provider]  ibuprofen (ADVIL,MOTRIN) 400 MG tablet Take 1 tablet (400 mg total) by mouth every 8 (eight) hours as needed. Patient taking differently: Take 400 mg by mouth every 8 (eight) hours as needed for moderate pain.  10/12/17   Couture, Cortni S, PA-C  ondansetron (ZOFRAN ODT) 4 MG disintegrating tablet Take 1 tablet (4 mg total) by mouth every  8 (eight) hours as needed for nausea or vomiting. 10/30/17   Leaphart, Zack Seal, PA-C  oxyCODONE (ROXICODONE) 5 MG immediate release tablet Take 1 tablet (5 mg total) by mouth every 6 (six) hours as needed for severe pain. 10/30/17   Doristine Devoid, PA-C  predniSONE (STERAPRED UNI-PAK 21 TAB) 10 MG (21) TBPK tablet Take by mouth daily. Take 6 tabs by mouth daily  for 2 days, then 5 tabs for 2 days, then 4 tabs for 2 days, then 3 tabs for 2 days, 2 tabs for 2 days, then 1 tab by mouth daily for 2 days Patient not  taking: Reported on 10/30/2017 10/12/17   Couture, Cortni S, PA-C  trimethoprim-polymyxin b (POLYTRIM) ophthalmic solution Place 1 drop into both eyes every 3 (three) hours while awake. For 7 days. Patient not taking: Reported on 10/30/2017 06/20/17   Alfonse Spruce, FNP    Family History Family History  Problem Relation Age of Onset  . Liver disease Father   . Alcohol abuse Father   . Diabetes Sister   . Obesity Sister   . Cancer Mother        breast   . Kidney disease Mother   . Colon cancer Neg Hx   . Esophageal cancer Neg Hx   . Rectal cancer Neg Hx   . Stomach cancer Neg Hx     Social History Social History   Tobacco Use  . Smoking status: Former Smoker    Types: Cigarettes    Last attempt to quit: 1995    Years since quitting: 24.3  . Smokeless tobacco: Never Used  Substance Use Topics  . Alcohol use: Yes    Alcohol/week: 2.4 oz    Types: 4 Cans of beer per week    Comment: weekends, 4/week  . Drug use: No     Allergies   Patient has no known allergies.   Review of Systems Review of Systems  Constitutional: Negative for chills and fever.  Respiratory: Negative for shortness of breath.   Cardiovascular: Negative for chest pain.  Gastrointestinal: Negative for abdominal pain, constipation, diarrhea, nausea and vomiting.  Genitourinary: Negative for difficulty urinating (no incontinence), dysuria and hematuria.  Musculoskeletal: Positive for back pain. Negative for arthralgias and myalgias.  Skin: Negative for color change.  Allergic/Immunologic: Negative for immunocompromised state.  Neurological: Negative for weakness and numbness.  Psychiatric/Behavioral: Negative for confusion.   All other systems reviewed and are negative for acute change except as noted in the HPI.    Physical Exam Updated Vital Signs BP 124/84 (BP Location: Right Arm)   Pulse 86   Temp 97.8 F (36.6 C) (Oral)   Resp 17   SpO2 98%   Physical Exam  Constitutional: He is  oriented to person, place, and time. Vital signs are normal. He appears well-developed and well-nourished.  Non-toxic appearance. No distress.  Afebrile, nontoxic, NAD  HENT:  Head: Normocephalic and atraumatic.  Mouth/Throat: Mucous membranes are normal.  Eyes: Conjunctivae and EOM are normal. Right eye exhibits no discharge. Left eye exhibits no discharge.  Neck: Normal range of motion. Neck supple.  Cardiovascular: Normal rate and intact distal pulses.  Pulmonary/Chest: Effort normal. No respiratory distress.  Abdominal: Normal appearance. He exhibits no distension.  Musculoskeletal: Normal range of motion.       Lumbar back: He exhibits tenderness and spasm. He exhibits normal range of motion, no bony tenderness and no deformity.       Back:  Lumbosacral spine with  FROM intact without spinous process TTP, no bony stepoffs or deformities, with mild L sided paraspinous muscle TTP extending into the mid-buttock region, with palpable muscle spasms. Strength and sensation grossly intact in all extremities, +SLR on left leg but neg on right, gait steady and nonantalgic. No overlying skin changes. Distal pulses intact.  Trace edema around the ankles bilaterally, no calf swelling and neg homan's sign bilaterally.   Neurological: He is alert and oriented to person, place, and time. He has normal strength. No sensory deficit.  Skin: Skin is warm, dry and intact. No rash noted.  Psychiatric: He has a normal mood and affect.  Nursing note and vitals reviewed.    ED Treatments / Results  Labs (all labs ordered are listed, but only abnormal results are displayed) Labs Reviewed - No data to display  EKG None  Radiology No results found.  Procedures Procedures (including critical care time)  Medications Ordered in ED Medications  ketorolac (TORADOL) 30 MG/ML injection 30 mg (30 mg Intramuscular Given 11/17/17 1821)     Initial Impression / Assessment and Plan / ED Course  I have  reviewed the triage vital signs and the nursing notes.  Pertinent labs & imaging results that were available during my care of the patient were reviewed by me and considered in my medical decision making (see chart for details).     57 y.o. male here with L lower back pain x1 month, was seen 10/12/17 for similar complaints and diagnosed with sciatica, given rx for prednisone taper which helped slightly but pain returned after stopping that. Does a lot of heavy lifting for work. On exam, moderate TTP to L lumbar paraspinous muscles into mid-buttock area on L side, +SLR on L side, no midline spinal tenderness. No red flag s/s of low back pain. No s/s of central cord compression or cauda equina. Lower extremities are neurovascularly intact and patient is ambulating without difficulty. No urinary complaints. Doubt need for imaging/labs, likely sciatica and possible muscular strain/tightness from heavy lifting.  Patient was counseled on back pain precautions and told to do activity as tolerated but do not lift, push, or pull heavy objects more than 10 pounds for the next week. Patient counseled to use ice or heat on back for no longer than 20 minutes every hour.   Rx given for muscle relaxer and counseled on proper use of muscle relaxant medication. Urged patient not to drink alcohol, drive, or perform any other activities that requires focus while taking these medications. Rx for mobic given. Advised tylenol use as well.   Doubt need for repeat steroid dosing.  Of note, he also mentions b/l ankle swelling; he's on amlodipine, this is likely a side effect of that medication; he denies any other complaints related to this. Exam reveals trace swelling around ankles but none into the calf and neg homan's sign bilaterally. Discussed f/up with PCP regarding this side effect from his amlodipine.  Patient urged to follow-up with orthopedist in 1-2wks for recheck of symptoms and ongoing management of his somewhat  chronic low back pain, or sooner if pain does not improve with treatment and rest or if pain becomes recurrent. Urged to return with worsening severe pain, loss of bowel or bladder control, trouble walking, or other worsening of symptoms; explicit return precautions given. The patient verbalizes understanding and agrees with the plan, I have answered their questions. Discharge instructions concerning home care and prescriptions have been given. The patient is STABLE and is discharged to  home in good condition.     Final Clinical Impressions(s) / ED Diagnoses   Final diagnoses:  Acute left-sided low back pain with left-sided sciatica  Back muscle spasm  Sciatica of left side    ED Discharge Orders        Ordered    meloxicam (MOBIC) 15 MG tablet  Daily     11/17/17 1817    cyclobenzaprine (FLEXERIL) 10 MG tablet  3 times daily PRN     11/17/17 [address] [place], [place] 11/17/17 1827    Lajean Saver, MD 11/17/17 [id number]

## 2017-11-17 NOTE — Discharge Instructions (Signed)
Back Pain: Your back pain should be treated with medicines such as ibuprofen or aleve and this back pain should get better over the next 2-4 weeks.  However if you develop severe or worsening pain, low back pain with fever, numbness, weakness or inability to walk or urinate, you should return to the ER immediately.  Please follow up with your doctor this week for a recheck if still having symptoms.  Avoid heavy lifting over 10 pounds over the next two weeks.  Low back pain is discomfort in the lower back that may be due to injuries to muscles and ligaments around the spine.  Occasionally, it may be caused by a a problem to a part of the spine called a disc.  The pain may last several days or a week;  However, most patients get completely well in 4-6 weeks.  Self - care:  The application of heat can help soothe the pain.  Maintaining your daily activities, including walking, is encourged, as it will help you get better faster than just staying in bed. Perform gentle stretching as discussed. Drink plenty of fluids.  Medications are also useful to help with pain control.  A commonly prescribed medication includes over the counter Tylenol; take as directed on the bottle.   Non steroidal anti inflammatory medications including mobic;  These medications help both pain and swelling and are very useful in treating back pain.  They should be taken with food, as they can cause stomach upset, and more seriously, stomach bleeding.  Use mobic daily as directed, don't use additional NSAIDs like ibuprofen/aleve/etc while taking mobic.     Muscle relaxants:  These medications can help with muscle tightness that is a cause of lower back pain.  Most of these medications can cause drowsiness, and it is not safe to drive or use dangerous machinery while taking them.  SEEK IMMEDIATE MEDICAL ATTENTION IF: New numbness, tingling, weakness, or problem with the use of your arms or legs.  Severe back pain not relieved with  medications.  Difficulty with or loss of control of your bowel or bladder control.  Increasing pain in any areas of the body (such as chest or abdominal pain).  Shortness of breath, dizziness or fainting.  Nausea (feeling sick to your stomach), vomiting, fever, or sweats.  You will need to follow up with  the orthopedist in 1-2 weeks for reassessment and ongoing management of your back pain.

## 2017-11-17 NOTE — ED Notes (Signed)
Patient declined d/c vitals.

## 2017-11-20 ENCOUNTER — Encounter: Payer: Self-pay | Admitting: Internal Medicine

## 2017-12-04 ENCOUNTER — Ambulatory Visit: Payer: Self-pay | Attending: Internal Medicine | Admitting: Internal Medicine

## 2017-12-04 ENCOUNTER — Encounter: Payer: Self-pay | Admitting: Internal Medicine

## 2017-12-04 VITALS — BP 126/91 | HR 110 | Temp 98.5°F | Resp 16 | Wt 190.8 lb

## 2017-12-04 DIAGNOSIS — K859 Acute pancreatitis without necrosis or infection, unspecified: Secondary | ICD-10-CM | POA: Insufficient documentation

## 2017-12-04 DIAGNOSIS — Z811 Family history of alcohol abuse and dependence: Secondary | ICD-10-CM | POA: Insufficient documentation

## 2017-12-04 DIAGNOSIS — G629 Polyneuropathy, unspecified: Secondary | ICD-10-CM | POA: Insufficient documentation

## 2017-12-04 DIAGNOSIS — I10 Essential (primary) hypertension: Secondary | ICD-10-CM

## 2017-12-04 DIAGNOSIS — R7989 Other specified abnormal findings of blood chemistry: Secondary | ICD-10-CM

## 2017-12-04 DIAGNOSIS — E559 Vitamin D deficiency, unspecified: Secondary | ICD-10-CM | POA: Insufficient documentation

## 2017-12-04 DIAGNOSIS — Z8379 Family history of other diseases of the digestive system: Secondary | ICD-10-CM | POA: Insufficient documentation

## 2017-12-04 DIAGNOSIS — D649 Anemia, unspecified: Secondary | ICD-10-CM | POA: Insufficient documentation

## 2017-12-04 DIAGNOSIS — Z833 Family history of diabetes mellitus: Secondary | ICD-10-CM | POA: Insufficient documentation

## 2017-12-04 DIAGNOSIS — M5432 Sciatica, left side: Secondary | ICD-10-CM

## 2017-12-04 DIAGNOSIS — Z860100 Personal history of colon polyps, unspecified: Secondary | ICD-10-CM

## 2017-12-04 DIAGNOSIS — Z79899 Other long term (current) drug therapy: Secondary | ICD-10-CM | POA: Insufficient documentation

## 2017-12-04 DIAGNOSIS — Z841 Family history of disorders of kidney and ureter: Secondary | ICD-10-CM | POA: Insufficient documentation

## 2017-12-04 DIAGNOSIS — R945 Abnormal results of liver function studies: Secondary | ICD-10-CM

## 2017-12-04 DIAGNOSIS — Z87891 Personal history of nicotine dependence: Secondary | ICD-10-CM | POA: Insufficient documentation

## 2017-12-04 MED ORDER — CARVEDILOL 6.25 MG PO TABS: 6.2500 mg | ORAL_TABLET | Freq: Two times a day (BID) | ORAL | 6 refills | Status: DC

## 2017-12-04 MED ORDER — MELOXICAM 15 MG PO TABS: 15.0000 mg | ORAL_TABLET | Freq: Every day | ORAL | 2 refills | Status: DC

## 2017-12-04 MED ORDER — CYCLOBENZAPRINE HCL 10 MG PO TABS: 10.0000 mg | ORAL_TABLET | Freq: Two times a day (BID) | ORAL | 0 refills | Status: DC | PRN

## 2017-12-04 NOTE — Patient Instructions (Addendum)
Restart Carvedilol for your blood pressure.  Avoid any heavy lifting.

## 2017-12-04 NOTE — Progress Notes (Signed)
Patient ID: Stephen Bowers, male    DOB: 10-Jan-1961  MRN: [id number]  CC: Hospitalization Follow-up (ed)   Subjective: Stephen Bowers is a 57 y.o. male who presents for ER f/u His concerns today include:  HTN, AKI, vit D def, unspecified anemia , neuropathy in fingers, alcoholic pancreatitis  Patient seen in the emergency room on 10/30/2017 with abdominal pain thought to be due to mild pancreatitis.  Is noted to have lipase in the 300s and elevated AST and ALT.  He reported drinking 3 16 oz beers daily.  Gallbladder ultrasound was negative for gallstones.  CT scan of the abdomen revealed changes consistent with acute pancreatitis.  He has not had any further abdominal pains.  Reports that he is drinking less. Now 2-3 16 oz beers several times a week.  Main complaint today is sciatic nerve pain in the left leg.  He works in a [employer] and several weeks ago he was pulling on a mattress and broke the handle.  He immediately felt some soreness in the left buttock got progressively worse over 3 weeks.  He started having some shooting pains down the back of the left leg.  No numbness.  He was seen in the emergency room and was given ibuprofen and prednisone taper.  The pain decreased while he was on prednisone but returned after he finished the taper.  He was given a note to be out of work for 3 days but he took only 1 day off because he could not afford to be out for days.  He was seen again in the ER on 11/17/2017 for the same.  This time he was placed on meloxicam and Flexeril.  He reports that the pain is better and not as constant as it was before.  However with certain movements he gets some tightness in the left calf and still has some soreness at the base of the left buttock.  He denies any lower back pain.  HTN: He has been off of Norvasc and carvedilol x1 month.  He states that 1 of the medicines was discontinued by the ER doctor because it was causing swelling in his lower legs.  He  could not remember which one so he stopped taking both.  He denies any chest pains or shortness of breath at this time  Patient received a letter from Dr. [name] informing him that he is due for repeat colonoscopy.  He has history of polyps Patient Active Problem List   Diagnosis Date Noted  . Hyperlipidemia 10/16/2016  . AKI (acute kidney injury) ([place])   . HTN (hypertension) 03/02/2015  . Vitamin D deficiency 01/19/2015  . Low back pain potentially associated with radiculopathy 03/30/2014     Current Outpatient Medications on File Prior to Visit  Medication Sig Dispense Refill  . trimethoprim-polymyxin b (POLYTRIM) ophthalmic solution Place 1 drop into both eyes every 3 (three) hours while awake. For 7 days. 10 mL 0  . acetaminophen (TYLENOL) 325 MG tablet Take 2 tablets (650 mg total) by mouth every 6 (six) hours as needed. Do not take more than 4000mg  of tylenol per day 30 tablet 0  . cholecalciferol (VITAMIN D) 1000 units tablet Take 1 tablet (1,000 Units total) by mouth daily. 90 tablet 1  . ferrous sulfate 325 (65 FE) MG tablet Take 1 tablet (325 mg total) by mouth daily with breakfast. 90 tablet 1  . gabapentin (NEURONTIN) 100 MG capsule Take 300 mg by mouth at bedtime.  0  No current facility-administered medications on file prior to visit.     No Known Allergies  Social History   Socioeconomic History  . Marital status: Married    Spouse name: Not on file  . Number of children: 1   . Years of education: 40   . Highest education level: Not on file  Occupational History  . Occupation: Unemployed   Social Needs  . Financial resource strain: Not on file  . Food insecurity:    Worry: Not on file    Inability: Not on file  . Transportation needs:    Medical: Not on file    Non-medical: Not on file  Tobacco Use  . Smoking status: Former Smoker    Types: Cigarettes    Last attempt to quit: 1995    Years since quitting: 24.4  . Smokeless tobacco: Never Used    Substance and Sexual Activity  . Alcohol use: Yes    Alcohol/week: 2.4 oz    Types: 4 Cans of beer per week    Comment: weekends, 4/week  . Drug use: No  . Sexual activity: Yes  Lifestyle  . Physical activity:    Days per week: Not on file    Minutes per session: Not on file  . Stress: Not on file  Relationships  . Social connections:    Talks on phone: Not on file    Gets together: Not on file    Attends religious service: Not on file    Active member of club or organization: Not on file    Attends meetings of clubs or organizations: Not on file    Relationship status: Not on file  . Intimate partner violence:    Fear of current or ex partner: Not on file    Emotionally abused: Not on file    Physically abused: Not on file    Forced sexual activity: Not on file  Other Topics Concern  . Not on file  Social History Narrative   Lives in an [place], family house.    Child [facility] to [employer] in [place], 39 yo F,           Family History  Problem Relation Age of Onset  . Liver disease Father   . Alcohol abuse Father   . Diabetes Sister   . Obesity Sister   . Cancer Mother        breast   . Kidney disease Mother   . Colon cancer Neg Hx   . Esophageal cancer Neg Hx   . Rectal cancer Neg Hx   . Stomach cancer Neg Hx     No past surgical history on file.  ROS: Review of Systems Negative except as stated above PHYSICAL EXAM: BP (!) 126/91   Pulse (!) 110   Temp 98.5 F (36.9 C) (Oral)   Resp 16   Wt 190 lb 12.8 oz (86.5 kg)   SpO2 97%   BMI 25.88 kg/m   Physical Exam Repeat blood pressure 130/100   General appearance - alert, well appearing, middle-aged African-American male in NAD and in no distress Mental status - alert, oriented to person, place, and time Neck - supple, no significant adenopathy Chest - clear to auscultation, no wheezes, rales or rhonchi, symmetric air entry Heart -mild tachycardia.  Regular rhythm Neurological -power in both lower  extremities 5/5 bilaterally.  Gross sensation intact.  Knee jerk reflexes are normal bilaterally. Musculoskeletal -no tenderness on palpation over lumbar spine. Extremities -no lower extremity edema.  ASSESSMENT AND PLAN: 1. Essential hypertension Not at goal.  Restart carvedilol but at a lower dose - carvedilol (COREG) 6.25 MG tablet; Take 1 tablet (6.25 mg total) by mouth 2 (two) times daily with a meal.  Dispense: 60 tablet; Refill: 6  2. Sciatica of left side By his report this is improving.  Try to avoid any heavy lifting or aggravating activities - meloxicam (MOBIC) 15 MG tablet; Take 1 tablet (15 mg total) by mouth daily. TAKE WITH MEALS  Dispense: 30 tablet; Refill: 2 - cyclobenzaprine (FLEXERIL) 10 MG tablet; Take 1 tablet (10 mg total) by mouth 2 (two) times daily as needed for muscle spasms.  Dispense: 30 tablet; Refill: 0  3. Abnormal LFTs Likely EtOH induced.  Encourage patient to cut back on the amount that he drinks especially in light of ER visit in April for pancreatitis - Hepatic Function Panel - Hepatitis C Antibody  4. History of colon polyps - Ambulatory referral to Gastroenterology  Patient was given the opportunity to ask questions.  Patient verbalized understanding of the plan and was able to repeat key elements of the plan.   Orders Placed This Encounter  Procedures  . Hepatic Function Panel  . Hepatitis C Antibody  . Ambulatory referral to Gastroenterology     Requested Prescriptions   Signed Prescriptions Disp Refills  . carvedilol (COREG) 6.25 MG tablet 60 tablet 6    Sig: Take 1 tablet (6.25 mg total) by mouth 2 (two) times daily with a meal.  . meloxicam (MOBIC) 15 MG tablet 30 tablet 2    Sig: Take 1 tablet (15 mg total) by mouth daily. TAKE WITH MEALS  . cyclobenzaprine (FLEXERIL) 10 MG tablet 30 tablet 0    Sig: Take 1 tablet (10 mg total) by mouth 2 (two) times daily as needed for muscle spasms.    Return in about 2 months (around  02/03/2018).  Karle Plumber, MD, FACP

## 2017-12-05 ENCOUNTER — Telehealth: Payer: Self-pay

## 2017-12-05 LAB — HEPATIC FUNCTION PANEL
ALT: 32 IU/L (ref 0–44)
AST: 51 IU/L — AB (ref 0–40)
Albumin: 4.6 g/dL (ref 3.5–5.5)
Alkaline Phosphatase: 39 IU/L (ref 39–117)
BILIRUBIN TOTAL: 0.2 mg/dL (ref 0.0–1.2)
BILIRUBIN, DIRECT: 0.09 mg/dL (ref 0.00–0.40)
Total Protein: 8.1 g/dL (ref 6.0–8.5)

## 2017-12-05 LAB — HEPATITIS C ANTIBODY: Hep C Virus Ab: <0.1 s/co ratio (ref 0.0–0.9)

## 2017-12-05 NOTE — Telephone Encounter (Signed)
Contacted pt to go over lab results pt is aware of results and doesn't have any questions or concerns 

## 2017-12-20 ENCOUNTER — Encounter (HOSPITAL_COMMUNITY): Payer: Self-pay | Admitting: Emergency Medicine

## 2017-12-20 ENCOUNTER — Other Ambulatory Visit: Payer: Self-pay

## 2017-12-20 ENCOUNTER — Inpatient Hospital Stay (HOSPITAL_COMMUNITY)
Admission: EM | Admit: 2017-12-20 | Discharge: 2017-12-24 | DRG: 438 | Disposition: A | Discharge status: Home / Self Care | Payer: Self-pay | Attending: Internal Medicine | Admitting: Internal Medicine

## 2017-12-20 ENCOUNTER — Emergency Department (HOSPITAL_COMMUNITY): Payer: Self-pay

## 2017-12-20 DIAGNOSIS — E872 Acidosis: Secondary | ICD-10-CM

## 2017-12-20 DIAGNOSIS — K852 Alcohol induced acute pancreatitis without necrosis or infection: Principal | ICD-10-CM | POA: Diagnosis present

## 2017-12-20 DIAGNOSIS — Z79899 Other long term (current) drug therapy: Secondary | ICD-10-CM

## 2017-12-20 DIAGNOSIS — Z6826 Body mass index (BMI) 26.0-26.9, adult: Secondary | ICD-10-CM

## 2017-12-20 DIAGNOSIS — E876 Hypokalemia: Secondary | ICD-10-CM | POA: Diagnosis present

## 2017-12-20 DIAGNOSIS — J9811 Atelectasis: Secondary | ICD-10-CM | POA: Diagnosis present

## 2017-12-20 DIAGNOSIS — N179 Acute kidney failure, unspecified: Secondary | ICD-10-CM

## 2017-12-20 DIAGNOSIS — K760 Fatty (change of) liver, not elsewhere classified: Secondary | ICD-10-CM | POA: Diagnosis present

## 2017-12-20 DIAGNOSIS — K859 Acute pancreatitis without necrosis or infection, unspecified: Secondary | ICD-10-CM | POA: Diagnosis present

## 2017-12-20 DIAGNOSIS — E7800 Pure hypercholesterolemia, unspecified: Secondary | ICD-10-CM | POA: Diagnosis present

## 2017-12-20 DIAGNOSIS — R6520 Severe sepsis without septic shock: Secondary | ICD-10-CM

## 2017-12-20 DIAGNOSIS — Z841 Family history of disorders of kidney and ureter: Secondary | ICD-10-CM

## 2017-12-20 DIAGNOSIS — K567 Ileus, unspecified: Secondary | ICD-10-CM | POA: Diagnosis present

## 2017-12-20 DIAGNOSIS — R571 Hypovolemic shock: Secondary | ICD-10-CM | POA: Diagnosis present

## 2017-12-20 DIAGNOSIS — E781 Pure hyperglyceridemia: Secondary | ICD-10-CM | POA: Diagnosis present

## 2017-12-20 DIAGNOSIS — R6511 Systemic inflammatory response syndrome (SIRS) of non-infectious origin with acute organ dysfunction: Secondary | ICD-10-CM | POA: Diagnosis present

## 2017-12-20 DIAGNOSIS — E874 Mixed disorder of acid-base balance: Secondary | ICD-10-CM | POA: Diagnosis present

## 2017-12-20 DIAGNOSIS — Z811 Family history of alcohol abuse and dependence: Secondary | ICD-10-CM

## 2017-12-20 DIAGNOSIS — E8729 Other acidosis: Secondary | ICD-10-CM | POA: Insufficient documentation

## 2017-12-20 DIAGNOSIS — A419 Sepsis, unspecified organism: Secondary | ICD-10-CM

## 2017-12-20 DIAGNOSIS — E785 Hyperlipidemia, unspecified: Secondary | ICD-10-CM | POA: Diagnosis present

## 2017-12-20 DIAGNOSIS — Z791 Long term (current) use of non-steroidal anti-inflammatories (NSAID): Secondary | ICD-10-CM

## 2017-12-20 DIAGNOSIS — I10 Essential (primary) hypertension: Secondary | ICD-10-CM

## 2017-12-20 DIAGNOSIS — F10239 Alcohol dependence with withdrawal, unspecified: Secondary | ICD-10-CM | POA: Diagnosis not present

## 2017-12-20 DIAGNOSIS — Z87891 Personal history of nicotine dependence: Secondary | ICD-10-CM

## 2017-12-20 DIAGNOSIS — I129 Hypertensive chronic kidney disease with stage 1 through stage 4 chronic kidney disease, or unspecified chronic kidney disease: Secondary | ICD-10-CM | POA: Diagnosis present

## 2017-12-20 DIAGNOSIS — E669 Obesity, unspecified: Secondary | ICD-10-CM | POA: Diagnosis present

## 2017-12-20 HISTORY — DX: Chronic kidney disease, unspecified: N18.9

## 2017-12-20 LAB — CBC WITH DIFFERENTIAL/PLATELET
BASOS PCT: 0 %
Basophils Absolute: 0 10*3/uL (ref 0.0–0.1)
EOS PCT: 0 %
Eosinophils Absolute: 0 10*3/uL (ref 0.0–0.7)
HEMATOCRIT: 51.4 % (ref 39.0–52.0)
Hemoglobin: 17.9 g/dL — ABNORMAL HIGH (ref 13.0–17.0)
LYMPHS ABS: 1.8 10*3/uL (ref 0.7–4.0)
Lymphocytes Relative: 14 %
MCH: 34.4 pg — ABNORMAL HIGH (ref 26.0–34.0)
MCHC: 34.8 g/dL (ref 30.0–36.0)
MCV: 98.7 fL (ref 78.0–100.0)
Monocytes Absolute: 0.6 10*3/uL (ref 0.1–1.0)
Monocytes Relative: 5 %
Neutro Abs: 10.5 10*3/uL — ABNORMAL HIGH (ref 1.7–7.7)
Neutrophils Relative %: 81 %
Platelets: 378 10*3/uL (ref 150–400)
RBC: 5.21 MIL/uL (ref 4.22–5.81)
RDW: 14 % (ref 11.5–15.5)
WBC MORPHOLOGY: INCREASED
WBC: 12.9 10*3/uL — AB (ref 4.0–10.5)

## 2017-12-20 LAB — I-STAT ARTERIAL BLOOD GAS, ED
ACID-BASE DEFICIT: 11 mmol/L — AB (ref 0.0–2.0)
Bicarbonate: 15.2 mmol/L — ABNORMAL LOW (ref 20.0–28.0)
O2 SAT: 95 %
PH ART: 7.252 — AB (ref 7.350–7.450)
TCO2: 16 mmol/L — AB (ref 22–32)
pCO2 arterial: 34.9 mmHg (ref 32.0–48.0)
pO2, Arterial: 89 mmHg (ref 83.0–108.0)

## 2017-12-20 LAB — COMPREHENSIVE METABOLIC PANEL
ALT: 132 U/L — AB (ref 17–63)
AST: 386 U/L — AB (ref 15–41)
Albumin: 3.8 g/dL (ref 3.5–5.0)
Alkaline Phosphatase: 28 U/L — ABNORMAL LOW (ref 38–126)
Anion gap: 23 — ABNORMAL HIGH (ref 5–15)
BUN: 12 mg/dL (ref 6–20)
CHLORIDE: 100 mmol/L — AB (ref 101–111)
CO2: 11 mmol/L — AB (ref 22–32)
Calcium: 8.1 mg/dL — ABNORMAL LOW (ref 8.9–10.3)
Creatinine, Ser: 3.03 mg/dL — ABNORMAL HIGH (ref 0.61–1.24)
GFR calc Af Amer: 25 mL/min — ABNORMAL LOW (ref >60)
GFR, EST NON AFRICAN AMERICAN: 22 mL/min — AB (ref >60)
Glucose, Bld: 143 mg/dL — ABNORMAL HIGH (ref 65–99)
Potassium: 6.2 mmol/L — ABNORMAL HIGH (ref 3.5–5.1)
SODIUM: 134 mmol/L — AB (ref 135–145)
Total Bilirubin: 4.6 mg/dL — ABNORMAL HIGH (ref 0.3–1.2)

## 2017-12-20 LAB — PROTIME-INR
INR: 1.12
PROTHROMBIN TIME: 14.3 s (ref 11.4–15.2)

## 2017-12-20 LAB — I-STAT VENOUS BLOOD GAS, ED
ACID-BASE DEFICIT: 9 mmol/L — AB (ref 0.0–2.0)
BICARBONATE: 18.2 mmol/L — AB (ref 20.0–28.0)
O2 Saturation: 52 %
PH VEN: 7.237 — AB (ref 7.250–7.430)
TCO2: 20 mmol/L — ABNORMAL LOW (ref 22–32)
pCO2, Ven: 42.9 mmHg — ABNORMAL LOW (ref 44.0–60.0)
pO2, Ven: 32 mmHg (ref 32.0–45.0)

## 2017-12-20 LAB — BASIC METABOLIC PANEL
Anion gap: 17 — ABNORMAL HIGH (ref 5–15)
BUN: 12 mg/dL (ref 6–20)
CHLORIDE: 106 mmol/L (ref 101–111)
CO2: 14 mmol/L — AB (ref 22–32)
CREATININE: 2.74 mg/dL — AB (ref 0.61–1.24)
Calcium: 7.6 mg/dL — ABNORMAL LOW (ref 8.9–10.3)
GFR calc Af Amer: 28 mL/min — ABNORMAL LOW (ref >60)
GFR calc non Af Amer: 24 mL/min — ABNORMAL LOW (ref >60)
Glucose, Bld: 126 mg/dL — ABNORMAL HIGH (ref 65–99)
POTASSIUM: 4.3 mmol/L (ref 3.5–5.1)
SODIUM: 137 mmol/L (ref 135–145)

## 2017-12-20 LAB — I-STAT CG4 LACTIC ACID, ED
LACTIC ACID, VENOUS: 9.24 mmol/L — AB (ref 0.5–1.9)
Lactic Acid, Venous: 7.35 mmol/L (ref 0.5–1.9)

## 2017-12-20 LAB — CBG MONITORING, ED: Glucose-Capillary: 110 mg/dL — ABNORMAL HIGH (ref 65–99)

## 2017-12-20 LAB — I-STAT TROPONIN, ED: Troponin i, poc: 0.09 ng/mL (ref 0.00–0.08)

## 2017-12-20 LAB — ETHANOL

## 2017-12-20 LAB — LIPASE, BLOOD: LIPASE: 1753 U/L — AB (ref 11–51)

## 2017-12-20 MED ORDER — ALBUTEROL SULFATE (2.5 MG/3ML) 0.083% IN NEBU
10.0000 mg | INHALATION_SOLUTION | Freq: Once | RESPIRATORY_TRACT | Status: AC
  Administered 2017-12-20: 10 mg via RESPIRATORY_TRACT
  Filled 2017-12-20: qty 12

## 2017-12-20 MED ORDER — SODIUM BICARBONATE 8.4 % IV SOLN
50.0000 meq | Freq: Once | INTRAVENOUS | Status: AC
  Administered 2017-12-20: 50 meq via INTRAVENOUS
  Filled 2017-12-20: qty 50

## 2017-12-20 MED ORDER — INSULIN ASPART 100 UNIT/ML IV SOLN
10.0000 [IU] | Freq: Once | INTRAVENOUS | Status: AC
  Administered 2017-12-20: 10 [IU] via INTRAVENOUS
  Filled 2017-12-20: qty 0.1

## 2017-12-20 MED ORDER — DEXTROSE 50 % IV SOLN
1.0000 | Freq: Once | INTRAVENOUS | Status: AC
  Administered 2017-12-20: 50 mL via INTRAVENOUS
  Filled 2017-12-20: qty 50

## 2017-12-20 MED ORDER — DEXTROSE 10 % IV SOLN
Freq: Once | INTRAVENOUS | Status: AC
  Administered 2017-12-20: 21:00:00 via INTRAVENOUS

## 2017-12-20 MED ORDER — ASPIRIN 81 MG PO CHEW: 324.0000 mg | CHEWABLE_TABLET | ORAL | Status: AC

## 2017-12-20 MED ORDER — INSULIN ASPART 100 UNIT/ML ~~LOC~~ SOLN: 1.0000 [IU] | SUBCUTANEOUS | Status: DC

## 2017-12-20 MED ORDER — THIAMINE HCL 100 MG/ML IJ SOLN
Freq: Once | INTRAVENOUS | Status: AC
  Administered 2017-12-21: via INTRAVENOUS
  Filled 2017-12-20: qty 1000

## 2017-12-20 MED ORDER — FENTANYL CITRATE (PF) 100 MCG/2ML IJ SOLN
50.0000 ug | Freq: Once | INTRAMUSCULAR | Status: AC
Start: 2017-12-20 — End: 2017-12-20
  Administered 2017-12-20: 50 ug via INTRAVENOUS
  Filled 2017-12-20: qty 2

## 2017-12-20 MED ORDER — ASPIRIN 300 MG RE SUPP: 300.0000 mg | RECTAL | Status: AC

## 2017-12-20 MED ORDER — SODIUM CHLORIDE 0.9 % IV BOLUS
1000.0000 mL | Freq: Once | INTRAVENOUS | Status: AC
  Administered 2017-12-20: 1000 mL via INTRAVENOUS

## 2017-12-20 MED ORDER — PIPERACILLIN-TAZOBACTAM 3.375 G IVPB 30 MIN
3.3750 g | Freq: Once | INTRAVENOUS | Status: AC
Start: 2017-12-20 — End: 2017-12-20
  Administered 2017-12-20: 3.375 g via INTRAVENOUS
  Filled 2017-12-20: qty 50

## 2017-12-20 MED ORDER — DEXTROSE-NACL 5-0.45 % IV SOLN
INTRAVENOUS | Status: AC
  Administered 2017-12-21: 01:00:00 via INTRAVENOUS

## 2017-12-20 MED ORDER — SODIUM CHLORIDE 0.9 % IV SOLN: 250.0000 mL | INTRAVENOUS | Status: DC | PRN

## 2017-12-20 MED ORDER — SODIUM CHLORIDE 0.9 % IV BOLUS: 1000.0000 mL | Freq: Once | INTRAVENOUS | Status: DC

## 2017-12-20 MED ORDER — CALCIUM GLUCONATE 10 % IV SOLN
1.0000 g | Freq: Once | INTRAVENOUS | Status: AC
Start: 2017-12-20 — End: 2017-12-20
  Administered 2017-12-20: 1 g via INTRAVENOUS
  Filled 2017-12-20: qty 10

## 2017-12-20 MED ORDER — HEPARIN SODIUM (PORCINE) 5000 UNIT/ML IJ SOLN
5000.0000 [IU] | Freq: Three times a day (TID) | INTRAMUSCULAR | Status: DC
  Administered 2017-12-21 – 2017-12-24 (×10): 5000 [IU] via SUBCUTANEOUS
  Filled 2017-12-20 (×10): qty 1

## 2017-12-20 MED ORDER — SODIUM CHLORIDE 0.9 % IV BOLUS
30.0000 mL/kg | Freq: Once | INTRAVENOUS | Status: AC
Start: 2017-12-20 — End: 2017-12-20
  Administered 2017-12-20: 2586 mL via INTRAVENOUS

## 2017-12-20 NOTE — ED Provider Notes (Signed)
[facility] EMERGENCY DEPARTMENT Provider Note   CSN: [id number] Arrival date & time: 12/20/17  1931     History   Chief Complaint Chief Complaint  Patient presents with  . Abdominal Pain  . Emesis    HPI Stephen Bowers is a 57 y.o. male.  The history is provided by the patient. No language interpreter was used.  Abdominal Pain   This is a new problem. Associated symptoms include vomiting.  Emesis   Associated symptoms include abdominal pain.   Stephen Bowers is a 57 y.o. male who presents to the Emergency Department complaining of abdominal pain. He reports severe abdominal pain that began this morning. Pain is across his lower abdomen and radiates to his right lower quadrant. It was sudden onset and he has associated nausea and vomiting - four episodes of nonbloody emesis. He denies any fevers, chest pain, diarrhea, dysuria. He has a history of hypertension. No prior similar symptoms. He does drink alcohol, about four beers a week. Denies any drug use.  Pain is constant and worsening.   Past Medical History:  Diagnosis Date  . Chronic musculoskeletal pain   . High cholesterol Dx 2011  . Hypertension     Patient Active Problem List   Diagnosis Date Noted  . Acute pancreatitis 12/20/2017  . Increased anion gap metabolic acidosis   . Severe sepsis ([place])   . Abnormal LFTs 12/04/2017  . Pancreatitis 12/04/2017  . Hyperlipidemia 10/16/2016  . AKI (acute kidney injury) ([place])   . HTN (hypertension) 03/02/2015  . Vitamin D deficiency 01/19/2015  . Low back pain potentially associated with radiculopathy 03/30/2014    History reviewed. No pertinent surgical history.      Home Medications    Prior to Admission medications   Medication Sig Start Date End Date Taking? Authorizing Provider  acetaminophen (TYLENOL) 325 MG tablet Take 2 tablets (650 mg total) by mouth every 6 (six) hours as needed. Do not take more than 4000mg  of tylenol per day 10/12/17   Yes Couture, Cortni S, PA-C  amLODipine (NORVASC) 10 MG tablet Take 10 mg by mouth daily.   Yes [provider]  carvedilol (COREG) 6.25 MG tablet Take 1 tablet (6.25 mg total) by mouth 2 (two) times daily with a meal. 12/04/17  Yes Ladell Pier, MD  cyclobenzaprine (FLEXERIL) 10 MG tablet Take 1 tablet (10 mg total) by mouth 2 (two) times daily as needed for muscle spasms. Patient taking differently: Take 10 mg by mouth 3 (three) times daily as needed for muscle spasms.  12/04/17  Yes Ladell Pier, MD  ferrous sulfate 325 (65 FE) MG tablet Take 1 tablet (325 mg total) by mouth daily with breakfast. 06/08/17  Yes Ladell Pier, MD  meloxicam (MOBIC) 15 MG tablet Take 1 tablet (15 mg total) by mouth daily. TAKE WITH MEALS 12/04/17  Yes Ladell Pier, MD  trimethoprim-polymyxin b (POLYTRIM) ophthalmic solution Place 1 drop into both eyes every 3 (three) hours while awake. For 7 days. Patient taking differently: Place 1 drop into both eyes every 3 (three) hours as needed (bacterial conjunctivitis).  06/20/17  Yes Fredia Beets R, FNP  cholecalciferol (VITAMIN D) 1000 units tablet Take 1 tablet (1,000 Units total) by mouth daily. Patient not taking: Reported on 12/20/2017 06/08/17   Ladell Pier, MD    Family History Family History  Problem Relation Age of Onset  . Liver disease Father   . Alcohol abuse Father   .  Diabetes Sister   . Obesity Sister   . Cancer Mother        breast   . Kidney disease Mother   . Colon cancer Neg Hx   . Esophageal cancer Neg Hx   . Rectal cancer Neg Hx   . Stomach cancer Neg Hx     Social History Social History   Tobacco Use  . Smoking status: Former Smoker    Types: Cigarettes    Last attempt to quit: 1995    Years since quitting: 24.4  . Smokeless tobacco: Never Used  Substance Use Topics  . Alcohol use: Yes    Alcohol/week: 2.4 oz    Types: 4 Cans of beer per week    Comment: weekends, 4/week  . Drug use: No      Allergies   Patient has no known allergies.   Review of Systems Review of Systems  Gastrointestinal: Positive for abdominal pain and vomiting.  All other systems reviewed and are negative.    Physical Exam Updated Vital Signs BP 101/76   Pulse (!) 117   Temp (!) 97.5 F (36.4 C) (Oral)   Resp (!) 24   Wt 88 kg (194 lb 0.1 oz)   SpO2 96%   BMI 26.31 kg/m   Physical Exam  Constitutional: He is oriented to person, place, and time. He appears well-developed and well-nourished. He appears distressed.  HENT:  Head: Normocephalic and atraumatic.  Cardiovascular: Regular rhythm.  No murmur heard. tachycardic  Pulmonary/Chest: Effort normal and breath sounds normal. No respiratory distress.  Abdominal:  Distended abdomen, severe diffuse tenderness with voluntary guarding  Musculoskeletal: He exhibits no edema or tenderness.  Neurological: He is alert and oriented to person, place, and time.  Skin: Skin is warm. He is diaphoretic.  Psychiatric: He has a normal mood and affect. His behavior is normal.  Nursing note and vitals reviewed.    ED Treatments / Results  Labs (all labs ordered are listed, but only abnormal results are displayed) Labs Reviewed  COMPREHENSIVE METABOLIC PANEL - Abnormal; Notable for the following components:      Result Value   Sodium 134 (*)    Potassium 6.2 (*)    Chloride 100 (*)    CO2 11 (*)    Glucose, Bld 143 (*)    Creatinine, Ser 3.03 (*)    Calcium 8.1 (*)    AST 386 (*)    ALT 132 (*)    Alkaline Phosphatase 28 (*)    Total Bilirubin 4.6 (*)    GFR calc non Af Amer 22 (*)    GFR calc Af Amer 25 (*)    Anion gap 23 (*)    All other components within normal limits  CBC WITH DIFFERENTIAL/PLATELET - Abnormal; Notable for the following components:   WBC 12.9 (*)    Hemoglobin 17.9 (*)    MCH 34.4 (*)    Neutro Abs 10.5 (*)    All other components within normal limits  LIPASE, BLOOD - Abnormal; Notable for the following  components:   Lipase 1,753 (*)    All other components within normal limits  BASIC METABOLIC PANEL - Abnormal; Notable for the following components:   CO2 14 (*)    Glucose, Bld 126 (*)    Creatinine, Ser 2.74 (*)    Calcium 7.6 (*)    GFR calc non Af Amer 24 (*)    GFR calc Af Amer 28 (*)    Anion gap 17 (*)  All other components within normal limits  LACTIC ACID, PLASMA - Abnormal; Notable for the following components:   Lactic Acid, Venous 5.5 (*)    All other components within normal limits  POTASSIUM - Abnormal; Notable for the following components:   Potassium 5.8 (*)    All other components within normal limits  GLUCOSE, CAPILLARY - Abnormal; Notable for the following components:   Glucose-Capillary 144 (*)    All other components within normal limits  I-STAT CG4 LACTIC ACID, ED - Abnormal; Notable for the following components:   Lactic Acid, Venous 9.24 (*)    All other components within normal limits  I-STAT TROPONIN, ED - Abnormal; Notable for the following components:   Troponin i, poc 0.09 (*)    All other components within normal limits  CBG MONITORING, ED - Abnormal; Notable for the following components:   Glucose-Capillary 110 (*)    All other components within normal limits  I-STAT CG4 LACTIC ACID, ED - Abnormal; Notable for the following components:   Lactic Acid, Venous 7.35 (*)    All other components within normal limits  I-STAT VENOUS BLOOD GAS, ED - Abnormal; Notable for the following components:   pH, Ven 7.237 (*)    pCO2, Ven 42.9 (*)    Bicarbonate 18.2 (*)    TCO2 20 (*)    Acid-base deficit 9.0 (*)    All other components within normal limits  I-STAT ARTERIAL BLOOD GAS, ED - Abnormal; Notable for the following components:   pH, Arterial 7.252 (*)    Bicarbonate 15.2 (*)    TCO2 16 (*)    Acid-base deficit 11.0 (*)    All other components within normal limits  CULTURE, BLOOD (ROUTINE X 2)  CULTURE, BLOOD (ROUTINE X 2)  MRSA PCR SCREENING    PROTIME-INR  ETHANOL  URINALYSIS, ROUTINE W REFLEX MICROSCOPIC  HIV ANTIBODY (ROUTINE TESTING)  BLOOD GAS, ARTERIAL  LACTIC ACID, PLASMA  LIPID PANEL  I-STAT CHEM 8, ED  I-STAT CG4 LACTIC ACID, ED  I-STAT CG4 LACTIC ACID, ED    EKG EKG Interpretation  Date/Time:  Thursday December 20 2017 19:49:38 EDT Ventricular Rate:  125 PR Interval:    QRS Duration: 86 QT Interval:  337 QTC Calculation: 486 R Axis:   68 Text Interpretation:  A-V dual-paced complexes w/ some inhibition No further analysis attempted due to paced rhythm Confirmed by Quintella Reichert [phone] on 12/20/2017 8:00:52 PM   Radiology Ct Abdomen Pelvis Wo Contrast  Result Date: 12/20/2017 CLINICAL DATA:  57 year old male with vomiting and abdominal pain since 0300 hours. EXAM: CT ABDOMEN AND PELVIS WITHOUT CONTRAST TECHNIQUE: Multidetector CT imaging of the abdomen and pelvis was performed following the standard protocol without IV contrast. COMPARISON:  CT Abdomen and Pelvis 10/30/2017 and earlier. FINDINGS: Lower chest: Dependent and subsegmental atelectasis in both lower lobes. No pleural effusion. No pericardial effusion. Calcified coronary artery atherosclerosis is evident. Hepatobiliary: Hepatic steatosis. No discrete liver lesion. The gallbladder is mildly dilated. Pancreas: Tremendous inflammatory stranding in the upper abdomen which seems to be emanating from the pancreas and lesser sac (series 3, image 37). In the absence of contrast the pancreatic parenchyma is highly indistinct. Layering and irregular fluid tracking around the spleen, in the left gutter and about both pararenal spaces. Severe stranding at the root of the mesentery. Spleen: Negative aside from surrounding fluid and inflammation. Adrenals/Urinary Tract: Normal adrenal glands aside from surrounding fluid and inflammation. Bilateral pararenal space inflammation and/or fluid. The kidneys appear stable, including  a small chronic hyperdense left renal midpole  cyst. No hydronephrosis or proximal hydroureter. Unremarkable urinary bladder. Stomach/Bowel: Decompressed rectosigmoid colon. The sigmoid is redundant. Decompressed descending colon and splenic flexure with mild to moderate wall thickening which appears secondary to the peripancreatic inflammation (series 3, image 40). The mid transverse colon appears normal. There is similar decompression and mild wall thickening at the hepatic flexure which appears secondary. The right colon has a more normal appearance. The cecum is on a lax mesentery located anteriorly. Negative terminal ileum. Normal appendix aside from the presence of appendicoliths seen on coronal image 52. Fluid-filled small bowel at the upper limits of normal throughout much of the abdomen. Mild secondary appearing gastric and duodenal wall thickening. No abdominal free air. Vascular/Lymphatic: Aortoiliac calcified atherosclerosis. Vascular patency is not evaluated in the absence of IV contrast. No definite lymphadenopathy. Reproductive: Negative. Other: Small volume of free fluid in the pelvis with simple fluid density (series 3, image 90). Musculoskeletal: No acute osseous abnormality identified. IMPRESSION: 1. Severe Acute Pancreatitis. Tremendous inflammatory stranding emanating from the pancreas in the upper abdomen. Scattered bilateral abdominal and pelvic free fluid, with no organized or drainable fluid collection at this time. 2. Secondary inflammation of bowel with associated ileus. 3. Fatty liver disease. 4. Lung base atelectasis. 5.  Aortic Atherosclerosis (ICD10-I70.0). Electronically Signed   By: Genevie Ann M.D.   On: 12/20/2017 21:54   Dg Chest Port 1 View  Result Date: 12/20/2017 CLINICAL DATA:  Fever vomiting EXAM: PORTABLE CHEST 1 VIEW COMPARISON:  08/13/2013 FINDINGS: Decreased lung volume with mild right lower lobe atelectasis. Negative for heart failure or pneumonia. Negative for pleural effusion IMPRESSION: Mild right lower lobe  atelectasis. Electronically Signed   By: Franchot Gallo M.D.   On: 12/20/2017 20:04    Procedures Procedures (including critical care time) CRITICAL CARE Performed by: Quintella Reichert   Total critical care time: 45 minutes  Critical care time was exclusive of separately billable procedures and treating other patients.  Critical care was necessary to treat or prevent imminent or life-threatening deterioration.  Critical care was time spent personally by me on the following activities: development of treatment plan with patient and/or surrogate as well as nursing, discussions with consultants, evaluation of patient's response to treatment, examination of patient, obtaining history from patient or surrogate, ordering and performing treatments and interventions, ordering and review of laboratory studies, ordering and review of radiographic studies, pulse oximetry and re-evaluation of patient's condition.  Medications Ordered in ED Medications  sodium chloride 0.9 % bolus 1,000 mL (has no administration in time range)  0.9 %  sodium chloride infusion (has no administration in time range)  aspirin chewable tablet 324 mg (has no administration in time range)    Or  aspirin suppository 300 mg (has no administration in time range)  heparin injection 5,000 Units (has no administration in time range)  dextrose 5 %-0.45 % sodium chloride infusion ( Intravenous New Bag/Given 12/21/17 0031)  lactated ringers bolus 2,000 mL (2,000 mLs Intravenous New Bag/Given 12/21/17 0012)  norepinephrine (LEVOPHED) 4mg  in D5W 252mL premix infusion (has no administration in time range)  fentaNYL (SUBLIMAZE) injection 25-100 mcg (has no administration in time range)  sodium chloride 0.9 % bolus 2,586 mL (0 mL/kg  86.2 kg Intravenous Stopped 12/20/17 2143)  piperacillin-tazobactam (ZOSYN) IVPB 3.375 g (0 g Intravenous Stopped 12/20/17 2143)  calcium gluconate inj 10% (1 g) URGENT USE ONLY! (1 g Intravenous Given 12/20/17  2030)  albuterol (PROVENTIL) (2.5 MG/3ML) 0.083% nebulizer  solution 10 mg (10 mg Nebulization Given 12/20/17 2047)  insulin aspart (novoLOG) injection 10 Units (10 Units Intravenous Given 12/20/17 2044)  dextrose 50 % solution 50 mL (50 mLs Intravenous Given 12/20/17 2034)  dextrose 10 % infusion ( Intravenous Stopped 12/21/17 0030)  sodium bicarbonate injection 50 mEq (50 mEq Intravenous Given 12/20/17 2037)  fentaNYL (SUBLIMAZE) injection 50 mcg (50 mcg Intravenous Given 12/20/17 2152)  sodium chloride 0.9 % bolus 1,000 mL (1,000 mLs Intravenous Transfusing/Transfer 12/20/17 2323)  sodium chloride 0.9 % 1,000 mL with thiamine 507 mg, folic acid 1 mg, multivitamins adult 10 mL infusion ( Intravenous New Bag/Given 12/21/17 0023)     Initial Impression / Assessment and Plan / ED Course  I have reviewed the triage vital signs and the nursing notes.  Pertinent labs & imaging results that were available during my care of the patient were reviewed by me and considered in my medical decision making (see chart for details).     Here for evaluation of abdominal pain starting this morning. Patient hypotensive on ED arrival with severe abdominal tenderness. He was treated with aggressive IV fluid hydration, antibiotics for possible infection. Initial labs demonstrate significant hyperkalemia, he was treated with calcium, insulin, dextrose, sodium bicarbonate, albuterol. CT demonstrates acute severe pancreatitis. Critical care consulted for admission for further treatment. Patient updated findings of studies recommendation for admission and he is in agreement with treatment plan.  Final Clinical Impressions(s) / ED Diagnoses   Final diagnoses:  None    ED Discharge Orders    None       Quintella Reichert, MD 12/21/17 [phone]

## 2017-12-20 NOTE — ED Notes (Signed)
Dr. Ralene Bathe states it is ok to start antibiotics before second set of blood cultures drawn

## 2017-12-20 NOTE — H&P (Addendum)
.. ..  Name: Stephen Bowers MRN: [id number] DOB: December 25, 1960    ADMISSION DATE:  12/20/2017 CONSULTATION DATE:  12/20/17  REFERRING MD :  Ralene Bathe MD  CHIEF COMPLAINT:  Abdominal pain  BRIEF PATIENT DESCRIPTION: 57 yr old male with PMHx HTN, HLD, p/w abdominal pain, nausea, vomiting and fevers. CTA/P shows severe pancreatitis. LA 9. PCCM consulted.  SIGNIFICANT EVENTS  Hypotension  STUDIES:  CT Ab/pelvis:  1. Severe Acute Pancreatitis. Tremendous inflammatory stranding emanating from the pancreas in the upper abdomen. Scattered bilateral abdominal and pelvic free fluid, with no organized or drainable fluid collection at this time. 2. Secondary inflammation of bowel with associated ileus. 3. Fatty liver disease. 4. Lung base atelectasis. 5.  Aortic Atherosclerosis    HISTORY OF PRESENT ILLNESS:  57 yr old male with PMHx of HTN, HLD,Low back pain with radiculopathy, presents to [facility] with complaints of vomiting and abdominal pain that began at 3AM on 12/20/17.  He describes that abdominal pain as sharp and located in his lowerr abdomen. He states that he has had a similar episode like this several months ago.  Pt describes non bloody non bilious vomiting and reports 3 episodes preceded by nausea.  He was only able to keep water down throughout the day.  He denies any reports of diarrhea, constipation, melena, or dysuria.  He denies any headaches, visual changes, changes in taste, numbness or tingling.He denies sick contacts or travel.  He denies h/o tobacco use but states that he drinks ETOH daily mostly beer and brandy but had his last drink 2 days ago. He is accompanied by his nephew.  PAST MEDICAL HISTORY :   has a past medical history of Chronic musculoskeletal pain, High cholesterol (Dx 2011), and Hypertension.  has no past surgical history on file. Prior to Admission medications   Medication Sig Start Date End Date Taking? Authorizing Provider  acetaminophen (TYLENOL) 325 MG tablet  Take 2 tablets (650 mg total) by mouth every 6 (six) hours as needed. Do not take more than 4000mg  of tylenol per day 10/12/17  Yes Couture, Cortni S, PA-C  amLODipine (NORVASC) 10 MG tablet Take 10 mg by mouth daily.   Yes [provider]  carvedilol (COREG) 6.25 MG tablet Take 1 tablet (6.25 mg total) by mouth 2 (two) times daily with a meal. 12/04/17  Yes Ladell Pier, MD  cyclobenzaprine (FLEXERIL) 10 MG tablet Take 1 tablet (10 mg total) by mouth 2 (two) times daily as needed for muscle spasms. Patient taking differently: Take 10 mg by mouth 3 (three) times daily as needed for muscle spasms.  12/04/17  Yes Ladell Pier, MD  ferrous sulfate 325 (65 FE) MG tablet Take 1 tablet (325 mg total) by mouth daily with breakfast. 06/08/17  Yes Ladell Pier, MD  meloxicam (MOBIC) 15 MG tablet Take 1 tablet (15 mg total) by mouth daily. TAKE WITH MEALS 12/04/17  Yes Ladell Pier, MD  trimethoprim-polymyxin b (POLYTRIM) ophthalmic solution Place 1 drop into both eyes every 3 (three) hours while awake. For 7 days. Patient taking differently: Place 1 drop into both eyes every 3 (three) hours as needed (bacterial conjunctivitis).  06/20/17  Yes Fredia Beets R, FNP  cholecalciferol (VITAMIN D) 1000 units tablet Take 1 tablet (1,000 Units total) by mouth daily. Patient not taking: Reported on 12/20/2017 06/08/17   Ladell Pier, MD   No Known Allergies  FAMILY HISTORY:  family history includes Alcohol abuse in his father; Cancer in his  mother; Diabetes in his sister; Kidney disease in his mother; Liver disease in his father; Obesity in his sister. SOCIAL HISTORY:  reports that he quit smoking about 24 years ago. His smoking use included cigarettes. He has never used smokeless tobacco. He reports that he drinks about 2.4 oz of alcohol per week. He reports that he does not use drugs.  REVIEW OF SYSTEMS:  ( bolded items are pertinent positives) Constitutional: Negative for  fever, chills, weight loss, malaise/fatigue and diaphoresis.  HENT: Negative for hearing loss, ear pain, nosebleeds, congestion, sore throat, neck pain, tinnitus and ear discharge.   Eyes: Negative for blurred vision, double vision, photophobia, pain, discharge and redness.  Respiratory: Negative for cough, hemoptysis, sputum production, shortness of breath, wheezing and stridor.   Cardiovascular: Negative for chest pain, palpitations, orthopnea, claudication, leg swelling and PND.  Gastrointestinal: Negative for heartburn, nausea, vomiting, abdominal pain, diarrhea, constipation, blood in stool and melena.  Genitourinary: Negative for dysuria, urgency, frequency, hematuria and flank pain.  Musculoskeletal: Negative for myalgias, back pain, joint pain and falls.  Skin: Negative for itching and rash.  Neurological: Negative for dizziness, tingling, tremors, sensory change, speech change, focal weakness, seizures, loss of consciousness, weakness and headaches.  Endo/Heme/Allergies: Negative for environmental allergies and polydipsia. Does not bruise/bleed easily.  SUBJECTIVE:   VITAL SIGNS: Temp:  [100.4 F (38 C)] 100.4 F (38 C) (06/20 1935) Pulse Rate:  [32-124] 118 (06/20 2230) Resp:  [19-30] 23 (06/20 2230) BP: (60-115)/(49-78) 109/78 (06/20 2230) SpO2:  [95 %-100 %] 98 % (06/20 2230) Weight:  [86.2 kg (190 lb)] 86.2 kg (190 lb) (06/20 1944)  PHYSICAL EXAMINATION: General:  Alert, pleasant male in no distress just mild discomfort Neuro:  GCS 15 no focal deficits HEENT:  NCAT, PERRLA Cardiovascular:  S1 and S2 RRR no M/R/G appreciated Lungs:  B/l air entry decreased at right base no rhonci or wheezing appreciated Abdomen:  Diffusely tender obese Musculoskeletal:  No lower ext edema, no gross deformities Skin:  Grossly intact  Recent Labs  Lab 12/20/17 1945  NA 134*  K 6.2*  CL 100*  CO2 11*  BUN 12  CREATININE 3.03*  GLUCOSE 143*   Recent Labs  Lab 12/20/17 1945  HGB  17.9*  HCT 51.4  WBC 12.9*  PLT 378   Ct Abdomen Pelvis Wo Contrast  Result Date: 12/20/2017 CLINICAL DATA:  57 year old male with vomiting and abdominal pain since 0300 hours. EXAM: CT ABDOMEN AND PELVIS WITHOUT CONTRAST TECHNIQUE: Multidetector CT imaging of the abdomen and pelvis was performed following the standard protocol without IV contrast. COMPARISON:  CT Abdomen and Pelvis 10/30/2017 and earlier. FINDINGS: Lower chest: Dependent and subsegmental atelectasis in both lower lobes. No pleural effusion. No pericardial effusion. Calcified coronary artery atherosclerosis is evident. Hepatobiliary: Hepatic steatosis. No discrete liver lesion. The gallbladder is mildly dilated. Pancreas: Tremendous inflammatory stranding in the upper abdomen which seems to be emanating from the pancreas and lesser sac (series 3, image 37). In the absence of contrast the pancreatic parenchyma is highly indistinct. Layering and irregular fluid tracking around the spleen, in the left gutter and about both pararenal spaces. Severe stranding at the root of the mesentery. Spleen: Negative aside from surrounding fluid and inflammation. Adrenals/Urinary Tract: Normal adrenal glands aside from surrounding fluid and inflammation. Bilateral pararenal space inflammation and/or fluid. The kidneys appear stable, including a small chronic hyperdense left renal midpole cyst. No hydronephrosis or proximal hydroureter. Unremarkable urinary bladder. Stomach/Bowel: Decompressed rectosigmoid colon. The sigmoid is redundant. Decompressed  descending colon and splenic flexure with mild to moderate wall thickening which appears secondary to the peripancreatic inflammation (series 3, image 40). The mid transverse colon appears normal. There is similar decompression and mild wall thickening at the hepatic flexure which appears secondary. The right colon has a more normal appearance. The cecum is on a lax mesentery located anteriorly. Negative terminal  ileum. Normal appendix aside from the presence of appendicoliths seen on coronal image 52. Fluid-filled small bowel at the upper limits of normal throughout much of the abdomen. Mild secondary appearing gastric and duodenal wall thickening. No abdominal free air. Vascular/Lymphatic: Aortoiliac calcified atherosclerosis. Vascular patency is not evaluated in the absence of IV contrast. No definite lymphadenopathy. Reproductive: Negative. Other: Small volume of free fluid in the pelvis with simple fluid density (series 3, image 90). Musculoskeletal: No acute osseous abnormality identified. IMPRESSION: 1. Severe Acute Pancreatitis. Tremendous inflammatory stranding emanating from the pancreas in the upper abdomen. Scattered bilateral abdominal and pelvic free fluid, with no organized or drainable fluid collection at this time. 2. Secondary inflammation of bowel with associated ileus. 3. Fatty liver disease. 4. Lung base atelectasis. 5.  Aortic Atherosclerosis (ICD10-I70.0). Electronically Signed   By: Genevie Ann M.D.   On: 12/20/2017 21:54   Dg Chest Port 1 View  Result Date: 12/20/2017 CLINICAL DATA:  Fever vomiting EXAM: PORTABLE CHEST 1 VIEW COMPARISON:  08/13/2013 FINDINGS: Decreased lung volume with mild right lower lobe atelectasis. Negative for heart failure or pneumonia. Negative for pleural effusion IMPRESSION: Mild right lower lobe atelectasis. Electronically Signed   By: Franchot Gallo M.D.   On: 12/20/2017 20:04    ASSESSMENT / PLAN: NEURO: High risk for ETOH withdrawal\ 48 hrs since last ETOH use GCS 15 Not on sedation Analgesia with morphine prn  No seizures, fasciculations or hallucinations on my evaluation Start CIWA protocol Prn Ativan if CIWA is elevated   CARDIAC: Severe sepsis--> Shock source most likely severe pancreatitis IVF resuscitation- fluid responsive MAP goal >59mmHg If pt remains hypotensive despite fluids will start Vasopressors 2D ECHO to assess LVEF EKG reviewed  paced rhythm  QTC 486   PULMONARY: ABG: 7.266/33.4/83/15 Anion Gap Me\tabolic acidosis with respiratory alkalosis On nasal cannula Not currently in resp distress Mild right lower lobe atelectasis most likely due to abdominal pain Continue to monitor.  ID: Severe Sepsis--> Shock 1st set of blood cx obtained in ED prior to abx Patient given empiric Zosyn IV UA negative Lactate 9 will continue to trend  Most likely secondary to hypoperfusion from sepsis 2/2 pancreatitis  Endocrine: Check TSH Check cortisol No H/o insulin dependence if BG exceeds 180mg /dl will start on Phase 1 glycemic protocol  GI: Severe Alcoholic Pancreatitis Tremendous inflammatory stranding emanating from the pancreas in the upper abdomen. Scattered bilateral abdominal and pelvic free fluid, with no organized or drainable fluid collection at this time. No signs of necrosis mentioned. BUN 12 Galbladder distended but no stones mentioned.  Liver- hepatic steatosis Plan: Keep NPO Daily lipase levels Continues on IVF resuscitation  Secondary inflammation of bowel with associated ileus Plan: Insert NGT On Pepcid for prophylaxis  Heme: No acute issues ..hemoglobin 17.9 ..platelets 378 CBC appears hemoconcentrated No active signs of bleeding If Hgb<7 type and cross and  transfuse PRBCs No  h/o coagulopathy DVT PPx->heparin [place] and SCDs  RENAL Acute Kidney Injury Elevated creatinine from baseline Most likely dehydration plus hypoperfusion Continues on IVF Avoid nephrotoxic medications  Hyperkalemia-6.2 Specimen was hemolyzed Repeat potassium  Lab Results  Component Value  Date   CREATININE 3.03 (H) 12/20/2017   CREATININE 0.93 10/30/2017   CREATININE 0.88 10/29/2017  Anion Gap Metabolic Acidosis AG 23 Secondary to Lactate + ketones Lactic acidosis Alcoholic ketoacidosis Trend LA post IVF resuscitation     I, Dr Seward Carol have personally reviewed patient's available data,  including medical history, events of note, physical examination and test results as part of my evaluation. I have discussed with PA Shearon Stalls and other care providers such as pharmacist, RN and Elink. The patient is critically ill with multiple organ systems failure and requires high complexity decision making for assessment and support, frequent evaluation and titration of therapies, application of advanced monitoring technologies and extensive interpretation of multiple databases. Critical Care Time devoted to patient care services described in this note is 67 Minutes. This time reflects time of care of this signee Dr Seward Carol. This critical care time does not reflect procedure time, or teaching time or supervisory time of PA but could involve care discussion time    DISPOSITION: ICU  CC TIME: 66 ins PROGNOSIS: Guarded FAMILY:Nephew at bedside at tie of evaluation CODE STATUS: Full   Signed Dr Seward Carol Pulmonary Critical Care Locums 12/20/2017, 10:41 PM

## 2017-12-20 NOTE — ED Triage Notes (Signed)
Pt here for vomiting and abdominal pain since 3 am today. Denies taking HTN meds. HR 130 on arrival and unable to palpate BP. CBG 116. AO x 4. Elevation and depression in leads per EMS, but didn't call it here in ED. States pt denies being sick lately.

## 2017-12-21 ENCOUNTER — Encounter (HOSPITAL_COMMUNITY): Payer: Self-pay | Admitting: Pulmonary Disease

## 2017-12-21 ENCOUNTER — Inpatient Hospital Stay (HOSPITAL_COMMUNITY): Payer: Self-pay

## 2017-12-21 ENCOUNTER — Ambulatory Visit: Payer: Self-pay | Admitting: Internal Medicine

## 2017-12-21 DIAGNOSIS — I5030 Unspecified diastolic (congestive) heart failure: Secondary | ICD-10-CM

## 2017-12-21 LAB — LIPID PANEL
CHOLESTEROL: 378 mg/dL — AB (ref 0–200)
HDL: 19 mg/dL — ABNORMAL LOW (ref >40)
LDL Cholesterol: UNDETERMINED mg/dL (ref 0–99)
TRIGLYCERIDES: 1948 mg/dL — AB (ref <150)
VLDL: UNDETERMINED mg/dL (ref 0–40)

## 2017-12-21 LAB — BASIC METABOLIC PANEL
ANION GAP: 14 (ref 5–15)
BUN: 16 mg/dL (ref 6–20)
CO2: 16 mmol/L — ABNORMAL LOW (ref 22–32)
Calcium: 6.7 mg/dL — ABNORMAL LOW (ref 8.9–10.3)
Chloride: 106 mmol/L (ref 101–111)
Creatinine, Ser: 2.47 mg/dL — ABNORMAL HIGH (ref 0.61–1.24)
GFR calc Af Amer: 32 mL/min — ABNORMAL LOW (ref >60)
GFR, EST NON AFRICAN AMERICAN: 28 mL/min — AB (ref >60)
Glucose, Bld: 88 mg/dL (ref 65–99)
Potassium: 4.4 mmol/L (ref 3.5–5.1)
SODIUM: 136 mmol/L (ref 135–145)

## 2017-12-21 LAB — ECHOCARDIOGRAM COMPLETE
HEIGHTINCHES: 72 in
WEIGHTICAEL: 3104.08 [oz_av]

## 2017-12-21 LAB — HIV ANTIBODY (ROUTINE TESTING W REFLEX): HIV Screen 4th Generation wRfx: NONREACTIVE

## 2017-12-21 LAB — GLUCOSE, CAPILLARY
GLUCOSE-CAPILLARY: 104 mg/dL — AB (ref 65–99)
Glucose-Capillary: 144 mg/dL — ABNORMAL HIGH (ref 65–99)

## 2017-12-21 LAB — LACTIC ACID, PLASMA
Lactic Acid, Venous: 4.8 mmol/L (ref 0.5–1.9)
Lactic Acid, Venous: 5.5 mmol/L (ref 0.5–1.9)

## 2017-12-21 LAB — MRSA PCR SCREENING: MRSA BY PCR: POSITIVE — AB

## 2017-12-21 LAB — POTASSIUM: POTASSIUM: 5.8 mmol/L — AB (ref 3.5–5.1)

## 2017-12-21 MED ORDER — NOREPINEPHRINE 4 MG/250ML-% IV SOLN
0.0000 ug/min | INTRAVENOUS | Status: DC
  Filled 2017-12-21: qty 250

## 2017-12-21 MED ORDER — LACTATED RINGERS IV SOLN
INTRAVENOUS | Status: DC
  Administered 2017-12-21 – 2017-12-23 (×6): via INTRAVENOUS

## 2017-12-21 MED ORDER — LACTATED RINGERS IV BOLUS
2000.0000 mL | Freq: Once | INTRAVENOUS | Status: AC
  Administered 2017-12-21: 2000 mL via INTRAVENOUS

## 2017-12-21 MED ORDER — CHLORHEXIDINE GLUCONATE CLOTH 2 % EX PADS
6.0000 | MEDICATED_PAD | Freq: Every day | CUTANEOUS | Status: DC
  Administered 2017-12-21 – 2017-12-24 (×4): 6 via TOPICAL

## 2017-12-21 MED ORDER — FENTANYL CITRATE (PF) 100 MCG/2ML IJ SOLN
25.0000 ug | INTRAMUSCULAR | Status: DC | PRN
  Administered 2017-12-21 (×2): 100 ug via INTRAVENOUS
  Administered 2017-12-21 (×3): 50 ug via INTRAVENOUS
  Administered 2017-12-22 – 2017-12-23 (×4): 100 ug via INTRAVENOUS
  Filled 2017-12-21 (×9): qty 2

## 2017-12-21 MED ORDER — THIAMINE HCL 100 MG/ML IJ SOLN
100.0000 mg | Freq: Every day | INTRAMUSCULAR | Status: DC
Start: 2017-12-21 — End: 2017-12-23
  Administered 2017-12-22: 100 mg via INTRAVENOUS
  Filled 2017-12-21 (×2): qty 2

## 2017-12-21 MED ORDER — MUPIROCIN 2 % EX OINT
1.0000 "application " | TOPICAL_OINTMENT | Freq: Two times a day (BID) | CUTANEOUS | Status: DC
  Administered 2017-12-21 – 2017-12-24 (×8): 1 via NASAL
  Filled 2017-12-21 (×2): qty 22

## 2017-12-21 MED ORDER — LORAZEPAM 2 MG/ML IJ SOLN
1.0000 mg | Freq: Three times a day (TID) | INTRAMUSCULAR | Status: DC
  Administered 2017-12-21: 1 mg via INTRAVENOUS
  Filled 2017-12-21: qty 1

## 2017-12-21 MED ORDER — DEXMEDETOMIDINE HCL IN NACL 400 MCG/100ML IV SOLN
0.2000 ug/kg/h | INTRAVENOUS | Status: DC
  Administered 2017-12-21: 0.7 ug/kg/h via INTRAVENOUS
  Administered 2017-12-22: 0.6 ug/kg/h via INTRAVENOUS
  Filled 2017-12-21 (×2): qty 100

## 2017-12-21 NOTE — Progress Notes (Signed)
PULMONARY / CRITICAL CARE MEDICINE   Name: Stephen Bowers MRN: [id number] DOB: 04-09-1961    ADMISSION DATE:  12/20/2017 CONSULTATION DATE:  12/20/2017  REFERRING MD:  Ralene Bathe MD  CHIEF COMPLAINT:  Abdominal pain  HISTORY OF PRESENT ILLNESS:   57 y/o male with hypertension admitted for severe acute alcoholic pancreatitis.     SUBJECTIVE:  Says that his belly pain is better but pain meds not quite strong enough Making urine Not anxious   VITAL SIGNS: BP 112/84   Pulse (!) 120   Temp 99.4 F (37.4 C) (Oral)   Resp (!) 27   Ht 6' (1.829 m)   Wt 88 kg (194 lb 0.1 oz)   SpO2 93%   BMI 26.31 kg/m   HEMODYNAMICS:    VENTILATOR SETTINGS:    INTAKE / OUTPUT: I/O last 3 completed shifts: In: 3526.6 [I.V.:1526.6; IV Piggyback:2000] Out: 250 [Urine:250]  PHYSICAL EXAMINATION:  General:  Resting comfortably in bed HENT: NCAT OP clear PULM: CTA B, normal effort CV: Tachy, no mgr GI: BS+, soft, nontender MSK: normal bulk and tone Neuro: awake, alert, no distress, MAEW   LABS:  BMET Recent Labs  Lab 12/20/17 1945 12/20/17 2204 12/20/17 2345  NA 134* 137  --   K 6.2* 4.3 5.8*  CL 100* 106  --   CO2 11* 14*  --   BUN 12 12  --   CREATININE 3.03* 2.74*  --   GLUCOSE 143* 126*  --     Electrolytes Recent Labs  Lab 12/20/17 1945 12/20/17 2204  CALCIUM 8.1* 7.6*    CBC Recent Labs  Lab 12/20/17 1945  WBC 12.9*  HGB 17.9*  HCT 51.4  PLT 378    Coag's Recent Labs  Lab 12/20/17 1945  INR 1.12    Sepsis Markers Recent Labs  Lab 12/20/17 2228 12/20/17 2345 12/21/17 0232  LATICACIDVEN 7.35* 5.5* 4.8*    ABG Recent Labs  Lab 12/20/17 2240  PHART 7.252*  PCO2ART 34.9  PO2ART 89.0    Liver Enzymes Recent Labs  Lab 12/20/17 1945  AST 386*  ALT 132*  ALKPHOS 28*  BILITOT 4.6*  ALBUMIN 3.8    Cardiac Enzymes No results for input(s): TROPONINI, PROBNP in the last 168 hours.  Glucose Recent Labs  Lab 12/20/17 2037  12/21/17 0043  GLUCAP 110* 144*    Imaging Ct Abdomen Pelvis Wo Contrast  Result Date: 12/20/2017 CLINICAL DATA:  56 year old male with vomiting and abdominal pain since 0300 hours. EXAM: CT ABDOMEN AND PELVIS WITHOUT CONTRAST TECHNIQUE: Multidetector CT imaging of the abdomen and pelvis was performed following the standard protocol without IV contrast. COMPARISON:  CT Abdomen and Pelvis 10/30/2017 and earlier. FINDINGS: Lower chest: Dependent and subsegmental atelectasis in both lower lobes. No pleural effusion. No pericardial effusion. Calcified coronary artery atherosclerosis is evident. Hepatobiliary: Hepatic steatosis. No discrete liver lesion. The gallbladder is mildly dilated. Pancreas: Tremendous inflammatory stranding in the upper abdomen which seems to be emanating from the pancreas and lesser sac (series 3, image 37). In the absence of contrast the pancreatic parenchyma is highly indistinct. Layering and irregular fluid tracking around the spleen, in the left gutter and about both pararenal spaces. Severe stranding at the root of the mesentery. Spleen: Negative aside from surrounding fluid and inflammation. Adrenals/Urinary Tract: Normal adrenal glands aside from surrounding fluid and inflammation. Bilateral pararenal space inflammation and/or fluid. The kidneys appear stable, including a small chronic hyperdense left renal midpole cyst. No hydronephrosis or proximal hydroureter. Unremarkable  urinary bladder. Stomach/Bowel: Decompressed rectosigmoid colon. The sigmoid is redundant. Decompressed descending colon and splenic flexure with mild to moderate wall thickening which appears secondary to the peripancreatic inflammation (series 3, image 40). The mid transverse colon appears normal. There is similar decompression and mild wall thickening at the hepatic flexure which appears secondary. The right colon has a more normal appearance. The cecum is on a lax mesentery located anteriorly. Negative  terminal ileum. Normal appendix aside from the presence of appendicoliths seen on coronal image 52. Fluid-filled small bowel at the upper limits of normal throughout much of the abdomen. Mild secondary appearing gastric and duodenal wall thickening. No abdominal free air. Vascular/Lymphatic: Aortoiliac calcified atherosclerosis. Vascular patency is not evaluated in the absence of IV contrast. No definite lymphadenopathy. Reproductive: Negative. Other: Small volume of free fluid in the pelvis with simple fluid density (series 3, image 90). Musculoskeletal: No acute osseous abnormality identified. IMPRESSION: 1. Severe Acute Pancreatitis. Tremendous inflammatory stranding emanating from the pancreas in the upper abdomen. Scattered bilateral abdominal and pelvic free fluid, with no organized or drainable fluid collection at this time. 2. Secondary inflammation of bowel with associated ileus. 3. Fatty liver disease. 4. Lung base atelectasis. 5.  Aortic Atherosclerosis (ICD10-I70.0). Electronically Signed   By: Genevie Ann M.D.   On: 12/20/2017 21:54   Dg Chest Port 1 View  Result Date: 12/20/2017 CLINICAL DATA:  Fever vomiting EXAM: PORTABLE CHEST 1 VIEW COMPARISON:  08/13/2013 FINDINGS: Decreased lung volume with mild right lower lobe atelectasis. Negative for heart failure or pneumonia. Negative for pleural effusion IMPRESSION: Mild right lower lobe atelectasis. Electronically Signed   By: Franchot Gallo M.D.   On: 12/20/2017 20:04     STUDIES:  6/20 CT Ab/pelvis: 1. Severe Acute Pancreatitis. Tremendous inflammatory stranding emanating from the pancreas in the upper abdomen. Scattered bilateral abdominal and pelvic free fluid, with no organized or drainable fluid collection at this time. 2. Secondary inflammation of bowel with associated ileus. 3. Fatty liver disease. 4. Lung base atelectasis. 5. Aortic Atherosclerosis     CULTURES: 9/20 blood >   ANTIBIOTICS:   SIGNIFICANT  EVENTS:   LINES/TUBES:   DISCUSSION: 57 y/o male with severe acute alcoholic pancreatitis.  ASSESSMENT / PLAN:  PULMONARY A: No acute issues P:   Monitor respiratory status  CARDIOVASCULAR A:  Shock due to hypovolemia, no sign of infection P:  Repeat lactic acid Add back crystallid  RENAL A:   AKI due to shock, hypoperfusion Baseline CKD P:   Monitor BMET and UOP Replace electrolytes as needed Continue fluids If renal function worse consider calling renal as they have followed him in the past  GASTROINTESTINAL A:   Severe acute pancreatitis P:   NPO except sips/chips Pain control IV fluids   HEMATOLOGIC A:   No acute issues P:  Monitor for bleeding Transfuse PRBC for Hgb < 7 gm/dL   INFECTIOUS A:   Systemic inflammatory response due to acute pancreatitis P:   Monitor off of antibiotics  ENDOCRINE A:   No acute issues P:   Monitor glucose  NEUROLOGIC A:   EtOH abuse, at risk for withdrawal P:   Thiamine Folate CIWA with prn ativan Add scheduled ativan Continue fentanyl prn   FAMILY  - Updates: wife, daughters, mother and aunts updated bedside   - Inter-disciplinary family meet or Palliative Care meeting due by:  Day 7  To [facility]  Pulmonary and [place] Pager: [phone]  12/21/2017, 11:34 AM

## 2017-12-21 NOTE — Progress Notes (Signed)
Pt again attempting to get OOB . Attempting to pull out PIVs and had already pulled pulse Ox, BP Cuff and EKG wires.   Pt was oriented to person place and time. But was unsure why he was attempting to get OOB. Pt reoriented and promised to not get up without assistance  Pt assisted back onto bed and reconnected to monitor where HR was greater tahn 155.

## 2017-12-21 NOTE — Progress Notes (Signed)
[facility] PCCM  Became agitated after ativan  On exam no tremuolousness, no diaphoresis, so doubt EtOH withdrawal, suspect this was ativan related  Hold ativan  If agitation again> precedex  Roselie Awkward, MD [place] P[facility] Pager: [phone] Cell: [phone] After 3pm or if no response, call [phone]

## 2017-12-21 NOTE — Progress Notes (Signed)
eLink Physician-Brief Progress Note Patient Name: Stephen Bowers DOB: 11/03/1960 MRN: [id number]   Date of Service  12/21/2017  HPI/Events of Note  abd pain from pancreatitis  eICU Interventions  fent prn     Intervention Category Intermediate Interventions: Pain - evaluation and management  Brand Males 12/21/2017, 12:47 AM

## 2017-12-21 NOTE — Progress Notes (Signed)
  Echocardiogram 2D Echocardiogram has been performed.  Darlina Sicilian M 12/21/2017, 9:19 AM

## 2017-12-21 NOTE — Progress Notes (Signed)
eLink Physician-Brief Progress Note Patient Name: Stephen Bowers DOB: [birth year] MRN: [id number]   Date of Service  12/21/2017  HPI/Events of Note  hypotnesive  eICU Interventions  2L LR Bolus Start levophed     Intervention Category Major Interventions: Shock - evaluation and management  Philopateer Strine 12/21/2017, 12:08 AM

## 2017-12-21 NOTE — Progress Notes (Signed)
Pt attempting to get OOB . Pulled out 2x PIVs one was just disconnected at the hub of the catheter causing the patient to bleed from an open vein (IV catheter).  Pt also pulled off pulse Ox, BP Cuff and EKG wires.   Pt was oriented to person place and time. But was unsure why he was attempting to get OOB. Pt reoriented and promised to not get up without assistance  Pt assisted back onto bed and reconnected to monitor where HR was in the 145-150 range

## 2017-12-22 LAB — BASIC METABOLIC PANEL
Anion gap: 13 (ref 5–15)
BUN: 18 mg/dL (ref 6–20)
CALCIUM: 6.9 mg/dL — AB (ref 8.9–10.3)
CO2: 17 mmol/L — ABNORMAL LOW (ref 22–32)
Chloride: 107 mmol/L (ref 101–111)
Creatinine, Ser: 2.05 mg/dL — ABNORMAL HIGH (ref 0.61–1.24)
GFR calc Af Amer: 40 mL/min — ABNORMAL LOW (ref >60)
GFR, EST NON AFRICAN AMERICAN: 35 mL/min — AB (ref >60)
GLUCOSE: 99 mg/dL (ref 65–99)
Potassium: 3.3 mmol/L — ABNORMAL LOW (ref 3.5–5.1)
SODIUM: 137 mmol/L (ref 135–145)

## 2017-12-22 LAB — CBC WITH DIFFERENTIAL/PLATELET
BASOS ABS: 0.1 10*3/uL (ref 0.0–0.1)
Basophils Relative: 1 %
EOS ABS: 0.1 10*3/uL (ref 0.0–0.7)
Eosinophils Relative: 1 %
HCT: 32 % — ABNORMAL LOW (ref 39.0–52.0)
Hemoglobin: 10.9 g/dL — ABNORMAL LOW (ref 13.0–17.0)
LYMPHS ABS: 1.1 10*3/uL (ref 0.7–4.0)
Lymphocytes Relative: 11 %
MCH: 32.9 pg (ref 26.0–34.0)
MCHC: 34.1 g/dL (ref 30.0–36.0)
MCV: 96.7 fL (ref 78.0–100.0)
MONO ABS: 0.5 10*3/uL (ref 0.1–1.0)
Monocytes Relative: 5 %
NEUTROS ABS: 8.4 10*3/uL — AB (ref 1.7–7.7)
Neutrophils Relative %: 82 %
PLATELETS: 145 10*3/uL — AB (ref 150–400)
RBC: 3.31 MIL/uL — ABNORMAL LOW (ref 4.22–5.81)
RDW: 12.9 % (ref 11.5–15.5)
WBC Morphology: INCREASED
WBC: 10.2 10*3/uL (ref 4.0–10.5)

## 2017-12-22 MED ORDER — IBUPROFEN 100 MG/5ML PO SUSP: 400.0000 mg | Freq: Once | ORAL | Status: DC

## 2017-12-22 MED ORDER — SODIUM CHLORIDE 0.9 % IV BOLUS: 1000.0000 mL | Freq: Once | INTRAVENOUS | Status: DC

## 2017-12-22 MED ORDER — IBUPROFEN 200 MG PO TABS
400.0000 mg | ORAL_TABLET | Freq: Once | ORAL | Status: AC
  Administered 2017-12-22: 400 mg via ORAL
  Filled 2017-12-22: qty 2

## 2017-12-22 NOTE — Progress Notes (Addendum)
PULMONARY / CRITICAL CARE MEDICINE   Name: Stephen Bowers MRN: [id number] DOB: 1961-02-24    ADMISSION DATE:  12/20/2017 CONSULTATION DATE:  12/20/2017  REFERRING MD:  Ralene Bathe MD  CHIEF COMPLAINT:  Abdominal pain  HISTORY OF PRESENT ILLNESS:   57 y/o male with hypertension admitted for severe acute alcoholic pancreatitis.     SUBJECTIVE:  Agitated overnight, placed on precedex > has periods of explosive confusion, typically calm most of the night Making urine Some diarrhea  VITAL SIGNS: BP 109/79   Pulse 91   Temp (!) 97.4 F (36.3 C) (Oral)   Resp (!) 33   Ht 6' (1.829 m)   Wt 89.8 kg (197 lb 15.6 oz)   SpO2 94%   BMI 26.85 kg/m   HEMODYNAMICS:    VENTILATOR SETTINGS:    INTAKE / OUTPUT: I/O last 3 completed shifts: In: 6124.2 [I.V.:4124.2; IV Piggyback:2000] Out: 800 [Urine:800]  PHYSICAL EXAMINATION:  General: Sitting up on bedside commode HENT: NCAT OP clear PULM: respirations even, non-labored CV: extremities well perfused GI: non distended MSK: normal bulk and tone Neuro: awake, alert, no distress    LABS:  BMET Recent Labs  Lab 12/20/17 2204 12/20/17 2345 12/21/17 1216 12/22/17 0413  NA 137  --  136 137  K 4.3 5.8* 4.4 3.3*  CL 106  --  106 107  CO2 14*  --  16* 17*  BUN 12  --  16 18  CREATININE 2.74*  --  2.47* 2.05*  GLUCOSE 126*  --  88 99    Electrolytes Recent Labs  Lab 12/20/17 2204 12/21/17 1216 12/22/17 0413  CALCIUM 7.6* 6.7* 6.9*    CBC Recent Labs  Lab 12/20/17 1945 12/22/17 0637  WBC 12.9* 10.2  HGB 17.9* 10.9*  HCT 51.4 32.0*  PLT 378 145*    Coag's Recent Labs  Lab 12/20/17 1945  INR 1.12    Sepsis Markers Recent Labs  Lab 12/20/17 2228 12/20/17 2345 12/21/17 0232  LATICACIDVEN 7.35* 5.5* 4.8*    ABG Recent Labs  Lab 12/20/17 2240  PHART 7.252*  PCO2ART 34.9  PO2ART 89.0    Liver Enzymes Recent Labs  Lab 12/20/17 1945  AST 386*  ALT 132*  ALKPHOS 28*  BILITOT 4.6*   ALBUMIN 3.8    Cardiac Enzymes No results for input(s): TROPONINI, PROBNP in the last 168 hours.  Glucose Recent Labs  Lab 12/20/17 2037 12/21/17 0043 12/21/17 1938  GLUCAP 110* 144* 104*    Imaging No results found.   STUDIES:  6/20 CT Ab/pelvis: 1. Severe Acute Pancreatitis. Tremendous inflammatory stranding emanating from the pancreas in the upper abdomen. Scattered bilateral abdominal and pelvic free fluid, with no organized or drainable fluid collection at this time. 2. Secondary inflammation of bowel with associated ileus. 3. Fatty liver disease. 4. Lung base atelectasis. 5. Aortic Atherosclerosis     CULTURES: 9/20 blood >   ANTIBIOTICS:   SIGNIFICANT EVENTS:   LINES/TUBES:   DISCUSSION: 57 y/o male with severe acute alcoholic pancreatitis.  ASSESSMENT / PLAN:  PULMONARY A: No acute issues P:   Monitor respiratory status  CARDIOVASCULAR A:  Shock due to hypovolemia, no sign of infection > resolved P:  Tele Continue fluids  RENAL A:   AKI improved Baseline CKD P:   Monitor BMET and UOP Replace electrolytes as needed Continue LR  GASTROINTESTINAL A:   Severe acute pancreatitis > improving P:   Try clears today Continue IV pain control and IV fluids   HEMATOLOGIC  A:   No acute issues P:  Monitor for bleeding Transfuse PRBC for Hgb < 7 gm/dL   INFECTIOUS A:   Systemic inflammatory response due to acute pancreatitis P:   Monitor off of antibiotics  ENDOCRINE A:   No acute issues P:   Monitor glucose  NEUROLOGIC A:   EtOH abuse> intermittent agitation P:   Thiamine Folate precedex > try to wean off   FAMILY  - Updates: wife, daughters updated bedside  - Inter-disciplinary family meet or Palliative Care meeting due by:  Day 7  My cc time 33 minutes  Pulmonary and [place] Pager: [phone]  12/22/2017, 10:32 AM

## 2017-12-23 ENCOUNTER — Other Ambulatory Visit: Payer: Self-pay

## 2017-12-23 DIAGNOSIS — F10239 Alcohol dependence with withdrawal, unspecified: Secondary | ICD-10-CM

## 2017-12-23 DIAGNOSIS — E876 Hypokalemia: Secondary | ICD-10-CM

## 2017-12-23 MED ORDER — FOLIC ACID 1 MG PO TABS
1.0000 mg | ORAL_TABLET | Freq: Every day | ORAL | Status: DC
  Administered 2017-12-23 – 2017-12-24 (×2): 1 mg via ORAL
  Filled 2017-12-23 (×2): qty 1

## 2017-12-23 MED ORDER — VITAMIN B-1 100 MG PO TABS
100.0000 mg | ORAL_TABLET | Freq: Every day | ORAL | Status: DC
  Administered 2017-12-23 – 2017-12-24 (×2): 100 mg via ORAL
  Filled 2017-12-23 (×2): qty 1

## 2017-12-23 MED ORDER — CARVEDILOL 6.25 MG PO TABS
6.2500 mg | ORAL_TABLET | Freq: Two times a day (BID) | ORAL | Status: DC
  Administered 2017-12-23 – 2017-12-24 (×2): 6.25 mg via ORAL
  Filled 2017-12-23 (×2): qty 1

## 2017-12-23 MED ORDER — POTASSIUM CHLORIDE CRYS ER 20 MEQ PO TBCR
40.0000 meq | EXTENDED_RELEASE_TABLET | Freq: Once | ORAL | Status: AC
  Administered 2017-12-23: 40 meq via ORAL
  Filled 2017-12-23: qty 2

## 2017-12-23 MED ORDER — FENTANYL CITRATE (PF) 100 MCG/2ML IJ SOLN
25.0000 ug | INTRAMUSCULAR | Status: DC | PRN
  Administered 2017-12-23 – 2017-12-24 (×5): 50 ug via INTRAVENOUS
  Filled 2017-12-23 (×5): qty 2

## 2017-12-23 MED ORDER — AMLODIPINE BESYLATE 10 MG PO TABS
10.0000 mg | ORAL_TABLET | Freq: Every day | ORAL | Status: DC
  Administered 2017-12-23 – 2017-12-24 (×2): 10 mg via ORAL
  Filled 2017-12-23 (×2): qty 1

## 2017-12-23 NOTE — Progress Notes (Addendum)
Patient received from 80M. VSS. Contact isolation in place for positive MRSA screen. Telemetry box 21 placed on patient. Skin assessed by 2 RNs. Will continue to monitor. Bartholomew Crews, RN

## 2017-12-23 NOTE — Progress Notes (Signed)
RN called to give report and was asked to leave name and number for return call.

## 2017-12-23 NOTE — Progress Notes (Signed)
Report called to nurse, Abigail Butts. Patient's vitals are stable. Patient will be transferred in wheelchair. Wife at bedside and will come along on transfer.

## 2017-12-23 NOTE — Progress Notes (Signed)
[facility] TEAM 1 - Stepdown/ICU TEAM  Stephen Bowers  QBH:[id number] DOB: August 22, 1960 DOA: 12/20/2017 PCP: Ladell Pier, MD    Brief Narrative:  57 y/o M w/ a hx of HTN who was admitted for severe acute alcoholic pancreatitis.   Significant Events: 6/20 admit 6/21 TTE - EF 65-70% - no WMA - grade 1 DD 6/23 [facility] assumed care   Subjective: The patient is resting comfortably in bed.  He reports some mild generalized crampy-type abdominal pain but denies sharp epigastric pain.  He denies chest pain shortness breath fever or chills.  He is tolerating a clear liquid diet without any difficulty.  He is alert oriented and not presently agitated.  Assessment & Plan:  SIRS due to Severe acute pancreatitis SIRS physiology has resolved - clinically stabilized - slowly advancing diet - mobilize  Shock due to hypovolemia Resolved with volume resuscitation  Acute kidney failure  Baseline crt 0.88 as of April 2019 - crt is slowly improving - likely related to hypovolemic shock  Recent Labs  Lab 12/20/17 1945 12/20/17 2204 12/21/17 1216 12/22/17 0413  CREATININE 3.03* 2.74* 2.47* 2.05*    Hypokalemia Supplement and follow - check magnesium  EtOH abuse w/ withdrawal No evidence of active withdrawal at this time - monitor   HTN Blood pressure climbing - adjust medical treatment and follow  MRSA screen +  DVT prophylaxis: SQ heparin  Code Status: FULL CODE Family Communication: no family present at time of exam  Disposition Plan: transfer to tele bed   Consultants:  PCCM  Antimicrobials:  Zosyn 6/20  Objective: Blood pressure (!) 143/89, pulse 90, temperature 98.4 F (36.9 C), temperature source Oral, resp. rate (!) 31, height 6' (1.829 m), weight 89.7 kg (197 lb 12 oz), SpO2 91 %.  Intake/Output Summary (Last 24 hours) at 12/23/2017 0937 Last data filed at 12/23/2017 0900 Gross per 24 hour  Intake 3124.47 ml  Output 2350 ml  Net 774.47 ml   Filed Weights   12/21/17 0438 12/22/17 0437 12/23/17 0258  Weight: 88 kg (194 lb 0.1 oz) 89.8 kg (197 lb 15.6 oz) 89.7 kg (197 lb 12 oz)    Examination: General: No acute respiratory distress Lungs: Clear to auscultation bilaterally without wheezes or crackles Cardiovascular: Regular rate and rhythm without murmur gallop or rub normal S1 and S2 Abdomen: Mildly tender diffusely, nondistended, soft, bowel sounds positive, no rebound, no ascites, no appreciable mass Extremities: No significant cyanosis, clubbing, or edema bilateral lower extremities  CBC: Recent Labs  Lab 12/20/17 1945 12/22/17 0637  WBC 12.9* 10.2  NEUTROABS 10.5* 8.4*  HGB 17.9* 10.9*  HCT 51.4 32.0*  MCV 98.7 96.7  PLT 378 097*   Basic Metabolic Panel: Recent Labs  Lab 12/20/17 2204 12/20/17 2345 12/21/17 1216 12/22/17 0413  NA 137  --  136 137  K 4.3 5.8* 4.4 3.3*  CL 106  --  106 107  CO2 14*  --  16* 17*  GLUCOSE 126*  --  88 99  BUN 12  --  16 18  CREATININE 2.74*  --  2.47* 2.05*  CALCIUM 7.6*  --  6.7* 6.9*   GFR: Estimated Creatinine Clearance: 44.7 mL/min (A) (by C-G formula based on SCr of 2.05 mg/dL (H)).  Liver Function Tests: Recent Labs  Lab 12/20/17 1945  AST 386*  ALT 132*  ALKPHOS 28*  BILITOT 4.6*  PROT RESULTS UNAVAILABLE DUE TO INTERFERING SUBSTANCE  ALBUMIN 3.8   Recent Labs  Lab 12/20/17 1945  LIPASE 1,753*    Coagulation Profile: Recent Labs  Lab 12/20/17 1945  INR 1.12    Recent Results (from the past 240 hour(s))  Blood Culture (routine x 2)     Status: None (Preliminary result)   Collection Time: 12/20/17  9:01 PM  Result Value Ref Range Status   Specimen Description BLOOD LEFT ANTECUBITAL  Final   Special Requests   Final    BOTTLES DRAWN AEROBIC AND ANAEROBIC Blood Culture adequate volume   Culture   Final    NO GROWTH 2 DAYS Performed at [facility], [address] [place], [place] [address]    Report Status PENDING  Incomplete  Blood Culture (routine x  2)     Status: None (Preliminary result)   Collection Time: 12/20/17  9:01 PM  Result Value Ref Range Status   Specimen Description BLOOD SITE NOT SPECIFIED  Final   Special Requests   Final    BOTTLES DRAWN AEROBIC AND ANAEROBIC Blood Culture results may not be optimal due to an inadequate volume of blood received in culture bottles   Culture   Final    NO GROWTH 1 DAY Performed at [facility], [place] [address] [place], [place] [address]    Report Status PENDING  Incomplete  MRSA PCR Screening     Status: Abnormal   Collection Time: 12/21/17  1:03 AM  Result Value Ref Range Status   MRSA by PCR POSITIVE (A) NEGATIVE Final    Comment: RESULT CALLED TO, READ BACK BY AND VERIFIED WITH: Ellison Carwin RN 12/21/17 0226 JDW        The GeneXpert MRSA Assay (FDA approved for NASAL specimens only), is one component of a comprehensive MRSA colonization surveillance program. It is not intended to diagnose MRSA infection nor to guide or monitor treatment for MRSA infections. Performed at [facility], [place] [address] [place], [place] [address]      Scheduled Meds: . Chlorhexidine Gluconate Cloth  6 each Topical Q0600  . heparin  5,000 Units Subcutaneous Q8H  . mupirocin ointment  1 application Nasal BID  . thiamine  100 mg Intravenous Daily     LOS: 3 days   Cherene Altes, MD [facility] Office  [phone] Pager - Text Page per Shea Evans as per below:  On-Call/Text Page:      [email or web address]      password [email or web address]  If 7PM-7AM, please contact night-coverage [email or web address] Password [email or web address] 12/23/2017, 9:37 AM

## 2017-12-24 DIAGNOSIS — K859 Acute pancreatitis without necrosis or infection, unspecified: Secondary | ICD-10-CM

## 2017-12-24 DIAGNOSIS — I10 Essential (primary) hypertension: Secondary | ICD-10-CM

## 2017-12-24 LAB — FOLATE RBC
Folate, Hemolysate: 416.8 ng/mL
Folate, RBC: 1077 ng/mL (ref >498)
HEMATOCRIT: 38.7 % (ref 37.5–51.0)

## 2017-12-24 LAB — COMPREHENSIVE METABOLIC PANEL
ALT: 35 U/L (ref 17–63)
AST: 73 U/L — AB (ref 15–41)
Albumin: 2.9 g/dL — ABNORMAL LOW (ref 3.5–5.0)
Alkaline Phosphatase: 27 U/L — ABNORMAL LOW (ref 38–126)
Anion gap: 13 (ref 5–15)
BILIRUBIN TOTAL: 1.2 mg/dL (ref 0.3–1.2)
BUN: 9 mg/dL (ref 6–20)
CHLORIDE: 103 mmol/L (ref 101–111)
CO2: 21 mmol/L — ABNORMAL LOW (ref 22–32)
CREATININE: 1.17 mg/dL (ref 0.61–1.24)
Calcium: 8.7 mg/dL — ABNORMAL LOW (ref 8.9–10.3)
GFR calc Af Amer: >60 mL/min (ref >60)
Glucose, Bld: 92 mg/dL (ref 65–99)
Potassium: 2.9 mmol/L — ABNORMAL LOW (ref 3.5–5.1)
Sodium: 137 mmol/L (ref 135–145)
TOTAL PROTEIN: 6.9 g/dL (ref 6.5–8.1)

## 2017-12-24 LAB — POTASSIUM: POTASSIUM: 3.9 mmol/L (ref 3.5–5.1)

## 2017-12-24 LAB — CBC
HEMATOCRIT: 32.8 % — AB (ref 39.0–52.0)
HEMOGLOBIN: 11.3 g/dL — AB (ref 13.0–17.0)
MCH: 32.5 pg (ref 26.0–34.0)
MCHC: 34.5 g/dL (ref 30.0–36.0)
MCV: 94.3 fL (ref 78.0–100.0)
Platelets: 149 10*3/uL — ABNORMAL LOW (ref 150–400)
RBC: 3.48 MIL/uL — AB (ref 4.22–5.81)
RDW: 12.2 % (ref 11.5–15.5)
WBC: 9.5 10*3/uL (ref 4.0–10.5)

## 2017-12-24 LAB — MAGNESIUM: MAGNESIUM: 1.5 mg/dL — AB (ref 1.7–2.4)

## 2017-12-24 MED ORDER — POTASSIUM CHLORIDE 10 MEQ/100ML IV SOLN
10.0000 meq | INTRAVENOUS | Status: AC
  Administered 2017-12-24 (×2): 10 meq via INTRAVENOUS
  Filled 2017-12-24 (×2): qty 100

## 2017-12-24 MED ORDER — ROSUVASTATIN CALCIUM 10 MG PO TABS: 10.0000 mg | ORAL_TABLET | Freq: Every day | ORAL | Status: DC

## 2017-12-24 MED ORDER — BISACODYL 5 MG PO TBEC
5.0000 mg | DELAYED_RELEASE_TABLET | Freq: Every day | ORAL | Status: DC | PRN
  Administered 2017-12-24: 5 mg via ORAL
  Filled 2017-12-24: qty 1

## 2017-12-24 MED ORDER — SODIUM CHLORIDE 0.9 % IV SOLN
INTRAVENOUS | Status: DC | PRN
  Administered 2017-12-24: 08:00:00 via INTRAVENOUS

## 2017-12-24 MED ORDER — POTASSIUM CHLORIDE CRYS ER 20 MEQ PO TBCR
40.0000 meq | EXTENDED_RELEASE_TABLET | ORAL | Status: AC
  Administered 2017-12-24 (×2): 40 meq via ORAL
  Filled 2017-12-24 (×2): qty 2

## 2017-12-24 MED ORDER — THIAMINE HCL 100 MG PO TABS: 100.0000 mg | ORAL_TABLET | Freq: Every day | ORAL | 0 refills | Status: DC

## 2017-12-24 MED ORDER — ONDANSETRON 4 MG PO TBDP: 4.0000 mg | ORAL_TABLET | Freq: Three times a day (TID) | ORAL | 0 refills | Status: DC | PRN

## 2017-12-24 MED ORDER — ROSUVASTATIN CALCIUM 10 MG PO TABS: 10.0000 mg | ORAL_TABLET | Freq: Every day | ORAL | 3 refills | Status: DC

## 2017-12-24 MED ORDER — POTASSIUM CHLORIDE ER 20 MEQ PO TBCR: 20.0000 meq | EXTENDED_RELEASE_TABLET | Freq: Every day | ORAL | 0 refills | Status: DC

## 2017-12-24 MED ORDER — FOLIC ACID 1 MG PO TABS: 1.0000 mg | ORAL_TABLET | Freq: Every day | ORAL | 0 refills | Status: DC

## 2017-12-24 MED ORDER — MAGNESIUM OXIDE -MG SUPPLEMENT 400 (240 MG) MG PO TABS: 1.0000 | ORAL_TABLET | Freq: Every day | ORAL | 0 refills | Status: AC

## 2017-12-24 MED ORDER — GEMFIBROZIL 600 MG PO TABS: 600.0000 mg | ORAL_TABLET | Freq: Two times a day (BID) | ORAL | Status: DC

## 2017-12-24 MED ORDER — CARVEDILOL 12.5 MG PO TABS: 12.5000 mg | ORAL_TABLET | Freq: Two times a day (BID) | ORAL | Status: DC

## 2017-12-24 MED ORDER — FENOFIBRATE 160 MG PO TABS: 160.0000 mg | ORAL_TABLET | Freq: Every day | ORAL | 4 refills | Status: DC

## 2017-12-24 MED ORDER — FENOFIBRATE 160 MG PO TABS: 160.0000 mg | ORAL_TABLET | Freq: Every day | ORAL | Status: DC

## 2017-12-24 MED ORDER — CARVEDILOL 12.5 MG PO TABS: 12.5000 mg | ORAL_TABLET | Freq: Two times a day (BID) | ORAL | 3 refills | Status: DC

## 2017-12-24 MED ORDER — TRAMADOL HCL 50 MG PO TABS: 50.0000 mg | ORAL_TABLET | Freq: Four times a day (QID) | ORAL | 0 refills | Status: AC | PRN

## 2017-12-24 MED ORDER — DEXTROSE 5 % IV SOLN
3.0000 g | Freq: Once | INTRAVENOUS | Status: AC
  Administered 2017-12-24: 3 g via INTRAVENOUS
  Filled 2017-12-24: qty 6

## 2017-12-24 MED FILL — ROSUVASTATIN CALCIUM 10 MG: 10 | 30 days supply | Qty: 30 | Fill #0

## 2017-12-24 MED FILL — CARVEDILOL 12.5 MG TABLET: 12.5 | 30 days supply | Qty: 60 | Fill #2

## 2017-12-24 MED FILL — FENOFIBRATE 160 MG TABLET: 160 | 30 days supply | Qty: 30 | Fill #0

## 2017-12-24 MED FILL — POTASSIUM CL ER 20 MEQ TAB: 20 | 3 days supply | Qty: 3 | Fill #0

## 2017-12-24 MED FILL — POLYMYXIN B/TMP EYE DROPS: 10000-0.1 | 7 days supply | Qty: 10 | Fill #0

## 2017-12-24 MED FILL — MAGNESIUM OXIDE 400 MG TAB: 400 (240 MG | 7 days supply | Qty: 7 | Fill #0

## 2017-12-24 MED FILL — CYCLOBENZAPRINE 10 MG TAB: 10 | 15 days supply | Qty: 30 | Fill #0

## 2017-12-24 MED FILL — FOLIC ACID 1 MG TABS: 1 | 30 days supply | Qty: 30 | Fill #0

## 2017-12-24 MED FILL — traMADol HCL 50 MG TABS: 50 | 5 days supply | Qty: 20 | Fill #0

## 2017-12-24 MED FILL — MELOXICAM 15 MG TABLET: 15 | 30 days supply | Qty: 30 | Fill #0

## 2017-12-24 NOTE — Discharge Summary (Signed)
Physician Discharge Summary   Patient ID: Stephen Bowers MRN: [id number] DOB/AGE: [birth year] 55 y.o.  Admit date: 12/20/2017 Discharge date: 12/24/2017  Primary Care Physician:  Ladell Pier, MD   Recommendations for Outpatient Follow-up:  1. Follow up with PCP in 1-2 weeks 2. Please obtain BMP at the follow-up appointment 3. Please check LFTs, lipid panel at the follow-up appointment and in 1 month  Home Health: None  Equipment/Devices: None  Discharge Condition: stable  CODE STATUS: FULL  Diet recommendation: Heart healthy diet   Discharge Diagnoses:    . Acute severe pancreatitis likely due to alcohol and  hypertriglyceridemia SIRS due to severe acute pancreatitis with hypovolemic shock resolved:  Acute kidney injury Hypokalemia Hypomagnesemia   alcohol dependence with acute withdrawal Essential hypertension Severe hypertriglyceridemia  Consults:  Patient was admitted by critical care medicine  Allergies:  No Known Allergies   DISCHARGE MEDICATIONS: Allergies as of 12/24/2017   No Known Allergies     Medication List    TAKE these medications   acetaminophen 325 MG tablet Commonly known as:  TYLENOL Take 2 tablets (650 mg total) by mouth every 6 (six) hours as needed. Do not take more than 4021m of tylenol per day   amLODipine 10 MG tablet Commonly known as:  NORVASC Take 10 mg by mouth daily.   carvedilol 12.5 MG tablet Commonly known as:  COREG Take 1 tablet (12.5 mg total) by mouth 2 (two) times daily with a meal. What changed:    medication strength  how much to take   cholecalciferol 1000 units tablet Commonly known as:  VITAMIN D Take 1 tablet (1,000 Units total) by mouth daily.   cyclobenzaprine 10 MG tablet Commonly known as:  FLEXERIL Take 1 tablet (10 mg total) by mouth 2 (two) times daily as needed for muscle spasms. What changed:  when to take this   fenofibrate 160 MG tablet Take 1 tablet (160 mg total) by mouth  daily.   ferrous sulfate 325 (65 FE) MG tablet Take 1 tablet (325 mg total) by mouth daily with breakfast.   folic acid 1 MG tablet Commonly known as:  FOLVITE Take 1 tablet (1 mg total) by mouth daily. Start taking on:  12/25/2017   Magnesium Oxide 400 (240 Mg) MG Tabs Take 1 tablet (400 mg total) by mouth daily for 7 days.   meloxicam 15 MG tablet Commonly known as:  MOBIC Take 1 tablet (15 mg total) by mouth daily. TAKE WITH MEALS   ondansetron 4 MG disintegrating tablet Commonly known as:  ZOFRAN ODT Take 1 tablet (4 mg total) by mouth every 8 (eight) hours as needed for nausea or vomiting.   Potassium Chloride ER 20 MEQ Tbcr Take 20 mEq by mouth daily for 3 days.   rosuvastatin 10 MG tablet Commonly known as:  CRESTOR Take 1 tablet (10 mg total) by mouth at bedtime.   thiamine 100 MG tablet Take 1 tablet (100 mg total) by mouth daily. Start taking on:  12/25/2017   traMADol 50 MG tablet Commonly known as:  ULTRAM Take 1 tablet (50 mg total) by mouth every 6 (six) hours as needed for up to 5 days for moderate pain or severe pain.   trimethoprim-polymyxin b ophthalmic solution Commonly known as:  POLYTRIM Place 1 drop into both eyes every 3 (three) hours while awake. For 7 days. What changed:    when to take this  reasons to take this  additional instructions  Brief H and P: For complete details please refer to admission H and P, but in brief patient is a 56 year old male with hypertension, hyperlipidemia, alcohol use presented with abdominal pain, nausea, vomiting and fevers.  CT angiogram o abdomen and pelvis showed severe acute pancreatitis.  Describes abdominal pain as sharp, located in the lower abdomen, nonbloody nonbilious vomiting, only able to keep water down throughout the day.  Drinks alcohol daily mostly beer and brandy, last drink 2 days prior to admission.  At the time of admission 100.4 degree Fahrenheit, heart rate 118, respiratory rate 23,  BP 64/52 at the time of admission.  Patient was admitted to ICU by critical care medicine.  Hospital Course:  Acute pancreatitis likely due to alcohol abuse and hypertriglyceridemia, SIRS with hypovolemic shock Lipase 1753 at the time of admission, patient met Sirs criteria with fevers, hypotension, tachycardia, source likely due to pancreatitis CT abdomen showed severe acute pancreatitis with tremendous inflammatory stranding emanating from the pancreas in the upper abdomen, no drainable fluid collection.  Secondary inflammation of bowel with associated ileus. Currently has improved, patient was aggressively resuscitated with IV fluids.  Triglycerides 1948, cholesterol 378, LDL unable to calculate, placed on fenofibrate 160 mg daily with Crestor 10 mg daily Currently tolerating solid diet without any difficulty  Acute kidney injury Patient presented with creatinine functional 3.0 due to hypovolemic shock from acute pancreatitis Patient was aggressively hydrated with IV fluids, creatinine has improved to 1.1 at the time of discharge  Acute transaminitis: Due to acute pancreatitis, fatty liver, alcohol use - improved, AST elevated at 73 at discharge, patient recommended to quit alcohol  Acute hypertriglyceridemia -Unclear what type, triglycerides 1948, placed on fenofibrate 160 mg daily, Crestor 10 mg daily  Essential hypertension BP somewhat uncontrolled, increased Coreg to 12.5 mg twice daily and continue amlodipine  Alcohol abuse with intermittent agitation/withdrawals Currently resolved, patient was placed on thiamine, folate, Precedex drip and admitted to ICU Currently alert and oriented x3, family at the bedside   Day of Discharge S: Tolerating solid diet without any difficulty, eager to go home  BP (!) 149/96 (BP Location: Right Arm)   Pulse (!) 110   Temp 98.7 F (37.1 C) (Oral)   Resp 18   Ht 6' (1.829 m)   Wt 89.7 kg (197 lb 12 oz)   SpO2 95%   BMI 26.82 kg/m    Physical Exam: General: Alert and awake oriented x3 not in any acute distress. HEENT: anicteric sclera, pupils reactive to light and accommodation CVS: S1-S2 clear no murmur rubs or gallops Chest: clear to auscultation bilaterally, no wheezing rales or rhonchi Abdomen: soft nontender, nondistended, normal bowel sounds Extremities: no cyanosis, clubbing or edema noted bilaterally Neuro: Cranial nerves II-XII intact, no focal neurological deficits   The results of significant diagnostics from this hospitalization (including imaging, microbiology, ancillary and laboratory) are listed below for reference.      Procedures/Studies:  Study Conclusions  - Left ventricle: The cavity size was normal. Systolic function was   vigorous. The estimated ejection fraction was in the range of 65%   to 70%. Wall motion was normal; there were no regional wall   motion abnormalities. Doppler parameters are consistent with   abnormal left ventricular relaxation (grade 1 diastolic   dysfunction). - Ventricular septum: Septal motion showed abnormal function and   dyssynergy. - Aortic valve: No obvious vegetation seen. Consider TEE for   further clarification.  Ct Abdomen Pelvis Wo Contrast  Result Date: 12/20/2017 CLINICAL  DATA:  57 year old male with vomiting and abdominal pain since 0300 hours. EXAM: CT ABDOMEN AND PELVIS WITHOUT CONTRAST TECHNIQUE: Multidetector CT imaging of the abdomen and pelvis was performed following the standard protocol without IV contrast. COMPARISON:  CT Abdomen and Pelvis 10/30/2017 and earlier. FINDINGS: Lower chest: Dependent and subsegmental atelectasis in both lower lobes. No pleural effusion. No pericardial effusion. Calcified coronary artery atherosclerosis is evident. Hepatobiliary: Hepatic steatosis. No discrete liver lesion. The gallbladder is mildly dilated. Pancreas: Tremendous inflammatory stranding in the upper abdomen which seems to be emanating from the  pancreas and lesser sac (series 3, image 37). In the absence of contrast the pancreatic parenchyma is highly indistinct. Layering and irregular fluid tracking around the spleen, in the left gutter and about both pararenal spaces. Severe stranding at the root of the mesentery. Spleen: Negative aside from surrounding fluid and inflammation. Adrenals/Urinary Tract: Normal adrenal glands aside from surrounding fluid and inflammation. Bilateral pararenal space inflammation and/or fluid. The kidneys appear stable, including a small chronic hyperdense left renal midpole cyst. No hydronephrosis or proximal hydroureter. Unremarkable urinary bladder. Stomach/Bowel: Decompressed rectosigmoid colon. The sigmoid is redundant. Decompressed descending colon and splenic flexure with mild to moderate wall thickening which appears secondary to the peripancreatic inflammation (series 3, image 40). The mid transverse colon appears normal. There is similar decompression and mild wall thickening at the hepatic flexure which appears secondary. The right colon has a more normal appearance. The cecum is on a lax mesentery located anteriorly. Negative terminal ileum. Normal appendix aside from the presence of appendicoliths seen on coronal image 52. Fluid-filled small bowel at the upper limits of normal throughout much of the abdomen. Mild secondary appearing gastric and duodenal wall thickening. No abdominal free air. Vascular/Lymphatic: Aortoiliac calcified atherosclerosis. Vascular patency is not evaluated in the absence of IV contrast. No definite lymphadenopathy. Reproductive: Negative. Other: Small volume of free fluid in the pelvis with simple fluid density (series 3, image 90). Musculoskeletal: No acute osseous abnormality identified. IMPRESSION: 1. Severe Acute Pancreatitis. Tremendous inflammatory stranding emanating from the pancreas in the upper abdomen. Scattered bilateral abdominal and pelvic free fluid, with no organized or  drainable fluid collection at this time. 2. Secondary inflammation of bowel with associated ileus. 3. Fatty liver disease. 4. Lung base atelectasis. 5.  Aortic Atherosclerosis (ICD10-I70.0). Electronically Signed   By: Genevie Ann M.D.   On: 12/20/2017 21:54   Dg Chest Port 1 View  Result Date: 12/20/2017 CLINICAL DATA:  Fever vomiting EXAM: PORTABLE CHEST 1 VIEW COMPARISON:  08/13/2013 FINDINGS: Decreased lung volume with mild right lower lobe atelectasis. Negative for heart failure or pneumonia. Negative for pleural effusion IMPRESSION: Mild right lower lobe atelectasis. Electronically Signed   By: Franchot Gallo M.D.   On: 12/20/2017 20:04      LAB RESULTS: Basic Metabolic Panel: Recent Labs  Lab 12/22/17 0413 12/24/17 0515 12/24/17 1137  NA 137 137  --   K 3.3* 2.9* 3.9  CL 107 103  --   CO2 17* 21*  --   GLUCOSE 99 92  --   BUN 18 9  --   CREATININE 2.05* 1.17  --   CALCIUM 6.9* 8.7*  --   MG  --  1.5*  --    Liver Function Tests: Recent Labs  Lab 12/20/17 1945 12/24/17 0515  AST 386* 73*  ALT 132* 35  ALKPHOS 28* 27*  BILITOT 4.6* 1.2  PROT RESULTS UNAVAILABLE DUE TO INTERFERING SUBSTANCE 6.9  ALBUMIN 3.8 2.9*  Recent Labs  Lab 12/20/17 1945  LIPASE 1,753*   No results for input(s): AMMONIA in the last 168 hours. CBC: Recent Labs  Lab 12/22/17 0637 12/24/17 0515  WBC 10.2 9.5  NEUTROABS 8.4*  --   HGB 10.9* 11.3*  HCT 32.0* 32.8*  MCV 96.7 94.3  PLT 145* 149*   Cardiac Enzymes: No results for input(s): CKTOTAL, CKMB, CKMBINDEX, TROPONINI in the last 168 hours. BNP: Invalid input(s): POCBNP CBG: Recent Labs  Lab 12/21/17 0043 12/21/17 1938  GLUCAP 144* 104*      Disposition and Follow-up: Discharge Instructions    Diet - low sodium heart healthy   Complete by:  As directed    Increase activity slowly   Complete by:  As directed        DISPOSITION: [place] Follow up.   Why:  January 08, 2018 at 1:50 pm Contact information: [address] [place] [address] [phone]           Time coordinating discharge:  35 minutes Signed:   Estill Cotta M.D. [facility] 12/24/2017, 12:32 PM Pager: [phone]

## 2017-12-24 NOTE — Care Management Note (Signed)
Case Management Note  Patient Details  Name: Stephen Bowers MRN: [id number] Date of Birth: [birth year]  Subjective/Objective:                    Action/Plan:  Patient active with [facility] has appointment January 08, 2018 at 1:50 pm. Can use [facility] for assistance to have prescriptions filled.  Expected Discharge Date:  12/24/17               Expected Discharge Plan:  Home/Self Care  In-House Referral:  Financial Counselor  Discharge planning Services     Post Acute Care Choice:  NA Choice offered to:  NA  DME Arranged:  N/A DME Agency:  NA  HH Arranged:  NA HH Agency:  NA  Status of Service:  Completed, signed off  If discussed at [place] of Stay Meetings, dates discussed:    Additional Comments:  Marilu Favre, RN 12/24/2017, 10:32 AM

## 2017-12-25 LAB — CULTURE, BLOOD (ROUTINE X 2)
Culture: NO GROWTH
SPECIAL REQUESTS: ADEQUATE

## 2017-12-26 LAB — CULTURE, BLOOD (ROUTINE X 2): Culture: NO GROWTH

## 2017-12-31 ENCOUNTER — Encounter: Payer: Self-pay | Admitting: Internal Medicine

## 2018-01-02 ENCOUNTER — Ambulatory Visit: Payer: Self-pay

## 2018-01-08 ENCOUNTER — Inpatient Hospital Stay: Payer: Self-pay | Admitting: Internal Medicine

## 2018-01-22 MED FILL — ROSUVASTATIN CALCIUM 10 MG: 10 | 30 days supply | Qty: 30 | Fill #1

## 2018-01-22 MED FILL — ONDANSETRON ODT 4 MG TABLET: 4 | 6 days supply | Qty: 20 | Fill #0

## 2018-01-22 MED FILL — FENOFIBRATE 160 MG TABLET: 160 | 30 days supply | Qty: 30 | Fill #1

## 2018-01-29 ENCOUNTER — Other Ambulatory Visit: Payer: Self-pay

## 2018-01-30 MED ORDER — FOLIC ACID 1 MG PO TABS: 1.0000 mg | ORAL_TABLET | Freq: Every day | ORAL | 0 refills | Status: DC

## 2018-02-15 ENCOUNTER — Inpatient Hospital Stay: Payer: Self-pay | Admitting: Internal Medicine

## 2018-02-21 ENCOUNTER — Ambulatory Visit (AMBULATORY_SURGERY_CENTER): Payer: Self-pay | Admitting: *Deleted

## 2018-02-21 ENCOUNTER — Other Ambulatory Visit: Payer: Self-pay | Admitting: Internal Medicine

## 2018-02-21 ENCOUNTER — Other Ambulatory Visit: Payer: Self-pay

## 2018-02-21 ENCOUNTER — Encounter (INDEPENDENT_AMBULATORY_CARE_PROVIDER_SITE_OTHER): Payer: Self-pay

## 2018-02-21 VITALS — Ht 70.0 in | Wt 186.2 lb

## 2018-02-21 DIAGNOSIS — M5432 Sciatica, left side: Secondary | ICD-10-CM

## 2018-02-21 DIAGNOSIS — Z86010 Personal history of colonic polyps: Secondary | ICD-10-CM

## 2018-02-21 MED ORDER — PEG 3350-KCL-NABCB-NACL-NASULF 236 G PO SOLR: 4000.0000 mL | Freq: Once | ORAL | 0 refills | Status: AC

## 2018-02-21 MED FILL — CYCLOBENZAPRINE 10 MG TAB: 10 | 15 days supply | Qty: 30 | Fill #0

## 2018-02-21 MED FILL — MELOXICAM 15 MG TABLET: 15 | 30 days supply | Qty: 30 | Fill #1

## 2018-02-21 MED FILL — CARVEDILOL 12.5 MG TABLET: 12.5 | 30 days supply | Qty: 60 | Fill #3

## 2018-02-21 MED FILL — FENOFIBRATE 160 MG TABLET: 160 | 30 days supply | Qty: 30 | Fill #2

## 2018-02-21 MED FILL — GOLYTELY SOLUTION: 236 | 1 days supply | Qty: 4000 | Fill #0

## 2018-02-21 MED FILL — ROSUVASTATIN CALCIUM 10 MG: 10 | 30 days supply | Qty: 30 | Fill #2

## 2018-02-21 NOTE — Progress Notes (Signed)
Patient denies any allergies to egg or soy products. Patient denies complications with anesthesia/sedation.  Patient denies oxygen use at home and denies diet medications. Pamphlet given on colonoscopy. 

## 2018-03-07 ENCOUNTER — Ambulatory Visit (AMBULATORY_SURGERY_CENTER): Payer: Self-pay | Admitting: Internal Medicine

## 2018-03-07 ENCOUNTER — Encounter: Payer: Self-pay | Admitting: Internal Medicine

## 2018-03-07 VITALS — BP 127/95 | HR 67 | Temp 99.1°F | Resp 11 | Ht 70.0 in | Wt 186.0 lb

## 2018-03-07 DIAGNOSIS — Z86010 Personal history of colonic polyps: Secondary | ICD-10-CM

## 2018-03-07 DIAGNOSIS — K621 Rectal polyp: Secondary | ICD-10-CM

## 2018-03-07 DIAGNOSIS — D128 Benign neoplasm of rectum: Secondary | ICD-10-CM

## 2018-03-07 DIAGNOSIS — K635 Polyp of colon: Secondary | ICD-10-CM

## 2018-03-07 DIAGNOSIS — D123 Benign neoplasm of transverse colon: Secondary | ICD-10-CM

## 2018-03-07 DIAGNOSIS — D125 Benign neoplasm of sigmoid colon: Secondary | ICD-10-CM

## 2018-03-07 DIAGNOSIS — D127 Benign neoplasm of rectosigmoid junction: Secondary | ICD-10-CM

## 2018-03-07 DIAGNOSIS — D129 Benign neoplasm of anus and anal canal: Secondary | ICD-10-CM

## 2018-03-07 MED ORDER — SODIUM CHLORIDE 0.9 % IV SOLN: 500.0000 mL | Freq: Once | INTRAVENOUS | Status: DC

## 2018-03-07 NOTE — Progress Notes (Signed)
Alert and oriented x3, pleased with MAC, report to RN  

## 2018-03-07 NOTE — Progress Notes (Signed)
Called to room to assist during endoscopic procedure.  Patient ID and intended procedure confirmed with present staff. Received instructions for my participation in the procedure from the performing physician.  

## 2018-03-07 NOTE — Progress Notes (Signed)
Pt's states no medical or surgical changes since previsit or office visit. 

## 2018-03-07 NOTE — Patient Instructions (Signed)
Handouts: Polyps  YOU HAD AN ENDOSCOPIC PROCEDURE TODAY AT THE [facility]:   Refer to the procedure report that was given to you for any specific questions about what was found during the examination.  If the procedure report does not answer your questions, please call your gastroenterologist to clarify.  If you requested that your care partner not be given the details of your procedure findings, then the procedure report has been included in a sealed envelope for you to review at your convenience later.  YOU SHOULD EXPECT: Some feelings of bloating in the abdomen. Passage of more gas than usual.  Walking can help get rid of the air that was put into your GI tract during the procedure and reduce the bloating. If you had a lower endoscopy (such as a colonoscopy or flexible sigmoidoscopy) you may notice spotting of blood in your stool or on the toilet paper. If you underwent a bowel prep for your procedure, you may not have a normal bowel movement for a few days.  Please Note:  You might notice some irritation and congestion in your nose or some drainage.  This is from the oxygen used during your procedure.  There is no need for concern and it should clear up in a day or so.  SYMPTOMS TO REPORT IMMEDIATELY:   Following lower endoscopy (colonoscopy or flexible sigmoidoscopy):  Excessive amounts of blood in the stool  Significant tenderness or worsening of abdominal pains  Swelling of the abdomen that is new, acute  Fever of 100F or higher  For urgent or emergent issues, a gastroenterologist can be reached at any hour by calling [phone].   DIET:  We do recommend a small meal at first, but then you may proceed to your regular diet.  Drink plenty of fluids but you should avoid alcoholic beverages for 24 hours.  ACTIVITY:  You should plan to take it easy for the rest of today and you should NOT DRIVE or use heavy machinery until tomorrow (because of the sedation medicines used  during the test).    FOLLOW UP: Our staff will call the number listed on your records the next business day following your procedure to check on you and address any questions or concerns that you may have regarding the information given to you following your procedure. If we do not reach you, we will leave a message.  However, if you are feeling well and you are not experiencing any problems, there is no need to return our call.  We will assume that you have returned to your regular daily activities without incident.  If any biopsies were taken you will be contacted by phone or by letter within the next 1-3 weeks.  Please call us at [phone] if you have not heard about the biopsies in 3 weeks.    SIGNATURES/CONFIDENTIALITY: You and/or your care partner have signed paperwork which will be entered into your electronic medical record.  These signatures attest to the fact that that the information above on your After Visit Summary has been reviewed and is understood.  Full responsibility of the confidentiality of this discharge information lies with you and/or your care-partner. 

## 2018-03-07 NOTE — Op Note (Signed)
[facility] Patient Name: Stephen Bowers Procedure Date: 03/07/2018 4:25 PM MRN: [id number] Endoscopist: Jerene Bears , MD Age: 57 Referring MD:  Date of Birth: 1960/11/19 Gender: Male Account #: [id number] Procedure:                Colonoscopy Indications:              Surveillance: Personal history of adenomatous                            polyps on last colonoscopy 3 years ago Medicines:                Monitored Anesthesia Care Procedure:                Pre-Anesthesia Assessment:                           - Prior to the procedure, a History and Physical                            was performed, and patient medications and                            allergies were reviewed. The patient's tolerance of                            previous anesthesia was also reviewed. The risks                            and benefits of the procedure and the sedation                            options and risks were discussed with the patient.                            All questions were answered, and informed consent                            was obtained. Prior Anticoagulants: The patient has                            taken no previous anticoagulant or antiplatelet                            agents. ASA Grade Assessment: II - A patient with                            mild systemic disease. After reviewing the risks                            and benefits, the patient was deemed in                            satisfactory condition to undergo the procedure.  After obtaining informed consent, the colonoscope                            was passed under direct vision. Throughout the                            procedure, the patient's blood pressure, pulse, and                            oxygen saturations were monitored continuously. The                            Colonoscope was introduced through the anus and                            advanced to the cecum,  identified by appendiceal                            orifice and ileocecal valve. The colonoscopy was                            performed without difficulty. The patient tolerated                            the procedure well. The quality of the bowel                            preparation was fair clearing to adequate after                            copious irrigation and lavage in the right colon                            (though noted the patient ate solid food                            yesterday). The ileocecal valve, appendiceal                            orifice, and rectum were photographed. Scope In: 4:35:21 PM Scope Out: 4:50:30 PM Scope Withdrawal Time: 0 hours 11 minutes 53 seconds  Total Procedure Duration: 0 hours 15 minutes 9 seconds  Findings:                 The digital rectal exam was normal.                           Three sessile polyps were found in the rectum,                            sigmoid colon and transverse colon. The polyps were                            2 to 5 mm in size. These polyps  were removed with a                            cold snare. Resection and retrieval were complete.                           Internal hemorrhoids were found during                            retroflexion. The hemorrhoids were small. Complications:            No immediate complications. Estimated Blood Loss:     Estimated blood loss was minimal. Impression:               - Three 2 to 5 mm polyps in the rectum, in the                            sigmoid colon and in the transverse colon, removed                            with a cold snare. Resected and retrieved.                           - Small internal hemorrhoids. Recommendation:           - Patient has a contact number available for                            emergencies. The signs and symptoms of potential                            delayed complications were discussed with the                            patient.  Return to normal activities tomorrow.                            Written discharge instructions were provided to the                            patient.                           - Resume previous diet.                           - Continue present medications.                           - Await pathology results.                           - Repeat colonoscopy is recommended for                            surveillance. The colonoscopy date will be  determined after pathology results from today's                            exam become available for review. Jerene Bears, MD 03/07/2018 4:59:06 PM This report has been signed electronically.

## 2018-03-08 ENCOUNTER — Telehealth: Payer: Self-pay | Admitting: *Deleted

## 2018-03-08 ENCOUNTER — Telehealth: Payer: Self-pay

## 2018-03-08 NOTE — Telephone Encounter (Signed)
No answer for post procedure call back. Left message and will attempt to call back later this afternoon. SM 

## 2018-03-08 NOTE — Telephone Encounter (Signed)
Second post procedure follow up call, no answer 

## 2018-03-12 ENCOUNTER — Encounter: Payer: Self-pay | Admitting: Internal Medicine

## 2018-03-25 MED FILL — CYCLOBENZAPRINE 10 MG TAB: 10 | 15 days supply | Qty: 30 | Fill #1

## 2018-04-01 MED FILL — CARVEDILOL 12.5 MG TABLET: 12.5 | 30 days supply | Qty: 60 | Fill #4

## 2018-05-06 MED FILL — CYCLOBENZAPRINE 10 MG TAB: 10 | 15 days supply | Qty: 30 | Fill #1

## 2018-05-06 MED FILL — ROSUVASTATIN CALCIUM 10 MG: 10 | 30 days supply | Qty: 30 | Fill #3

## 2018-05-06 MED FILL — MELOXICAM 15 MG TABLET: 15 | 30 days supply | Qty: 30 | Fill #2

## 2018-05-06 MED FILL — CARVEDILOL 12.5 MG TABLET: 12.5 | 30 days supply | Qty: 60 | Fill #5

## 2018-05-06 MED FILL — FENOFIBRATE 160 MG TABLET: 160 | 30 days supply | Qty: 30 | Fill #3

## 2018-06-09 IMAGING — CT CT ABD-PELV W/O CM
2 of 3 series · 11 of 46 positions shown, 12 images · non-contrast
Comparison: 06/19/2014 abdominal ultrasound

CLINICAL DATA: 56 y/o M; left lower quadrant pain radiating to the
left flank.

EXAM:
CT ABDOMEN AND PELVIS WITHOUT CONTRAST
TECHNIQUE: Multidetector CT imaging of the abdomen and pelvis was performed
following the standard protocol without IV contrast.

[Series 301: routine, idose (2) · axial · 0.78mm/px · z∈[-911,-506]mm · 8 of 95 slices shown, 9 images]
[im 7/95  soft-tissue]
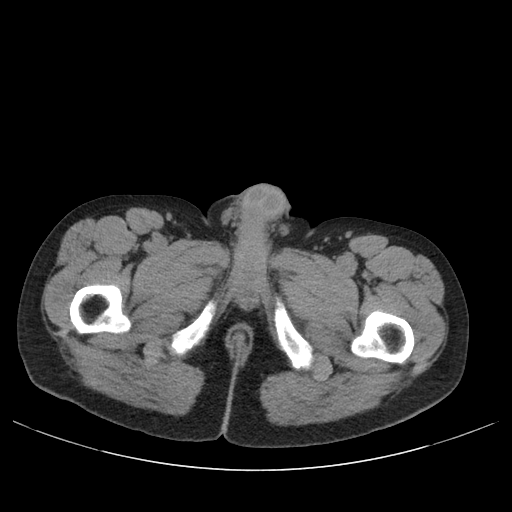
[im 7/95  bone]
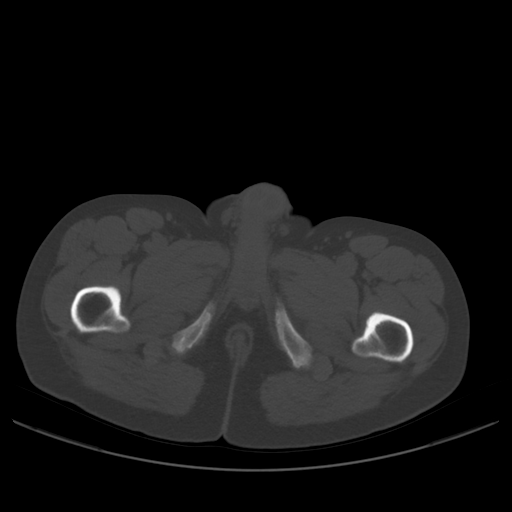
[im 19/95  soft-tissue]
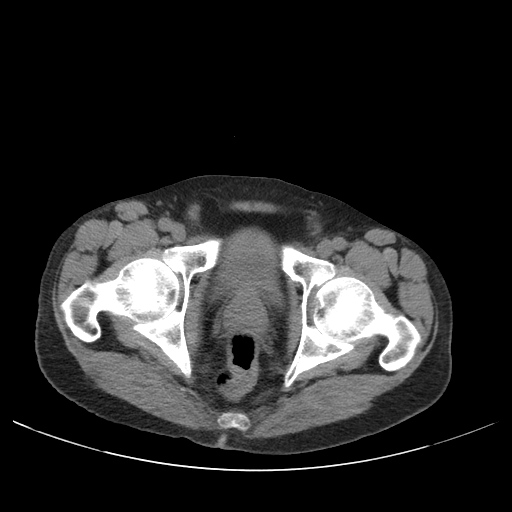
[im 31/95  soft-tissue]
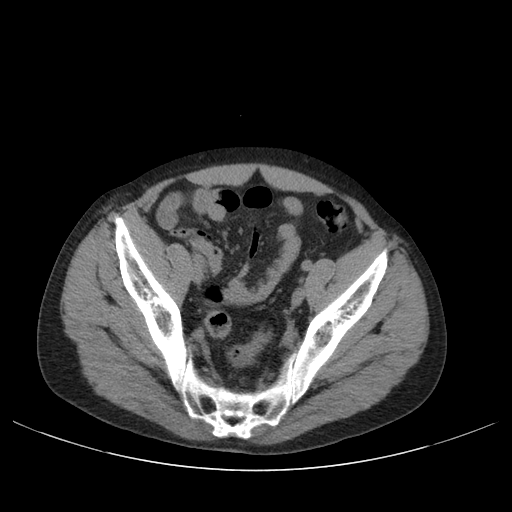
[im 43/95  soft-tissue]
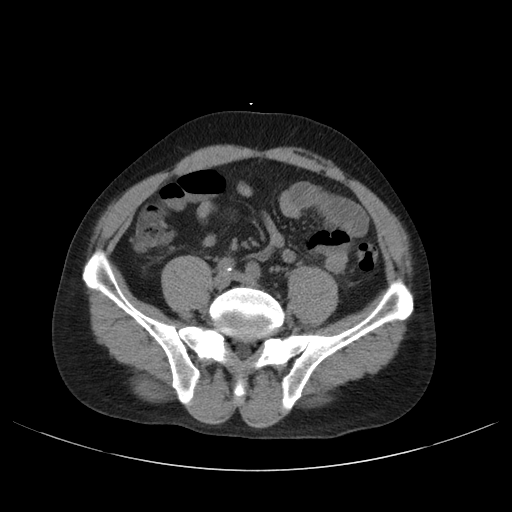
[im 52/95  soft-tissue]
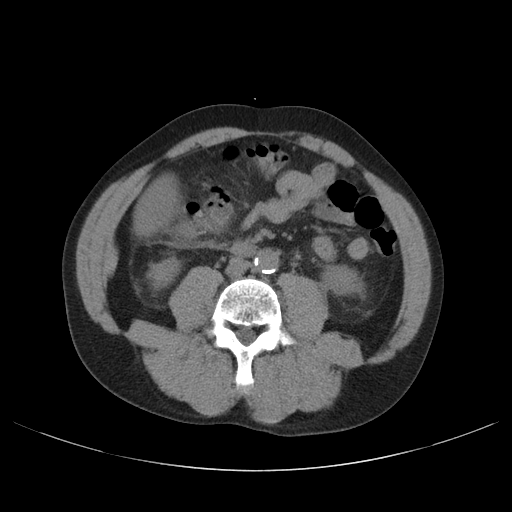
[im 64/95  soft-tissue]
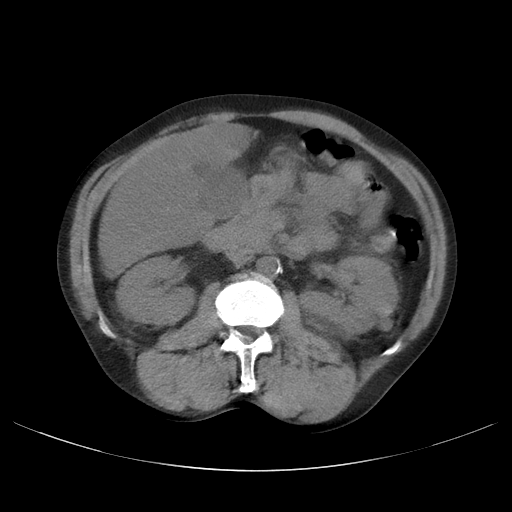
[im 76/95  soft-tissue]
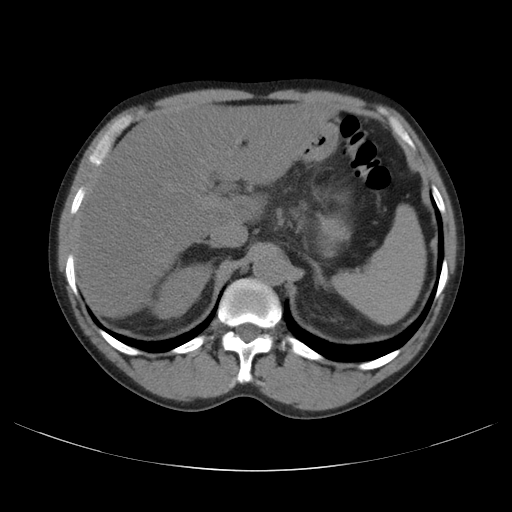
[im 88/95  soft-tissue]
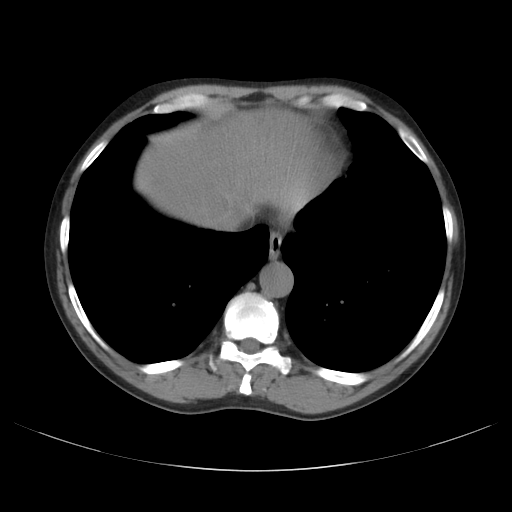

[Series 303: coronals, idose (2) · coronal · 0.45mm/px · 3 of 128 slices shown]
[im 43/128  soft-tissue]
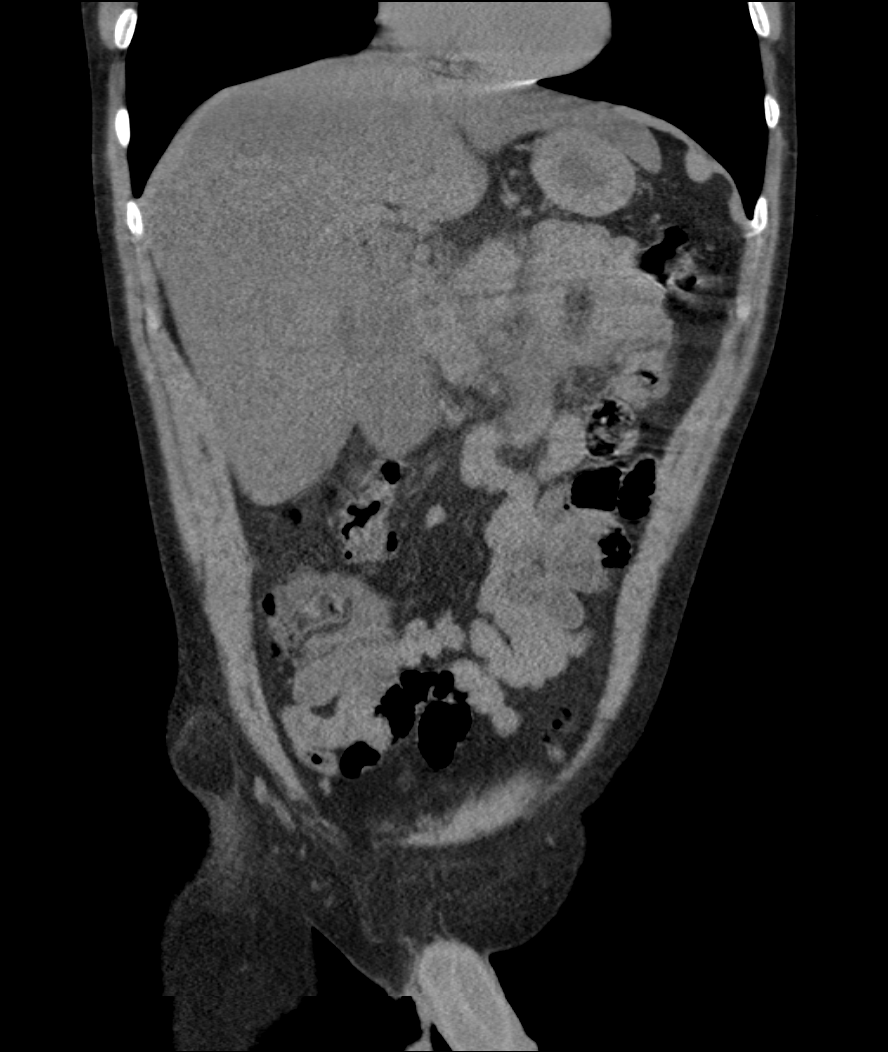
[im 57/128  soft-tissue]
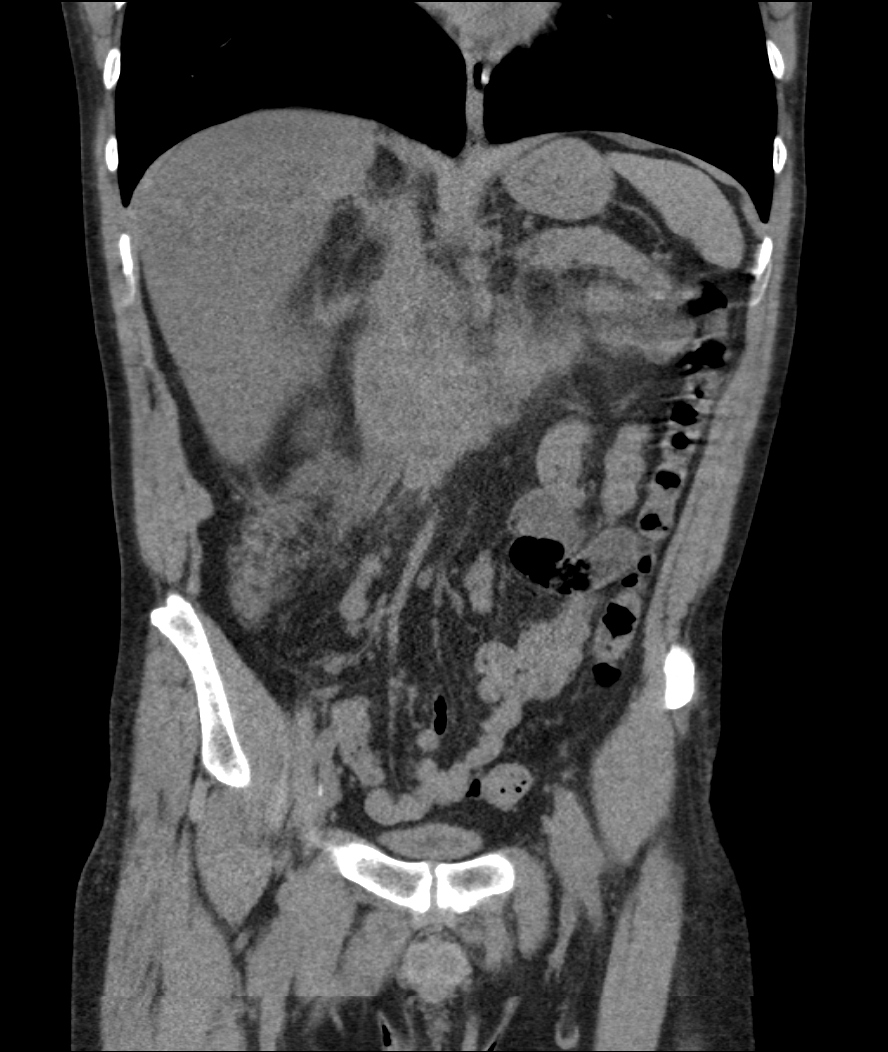
[im 71/128  soft-tissue]
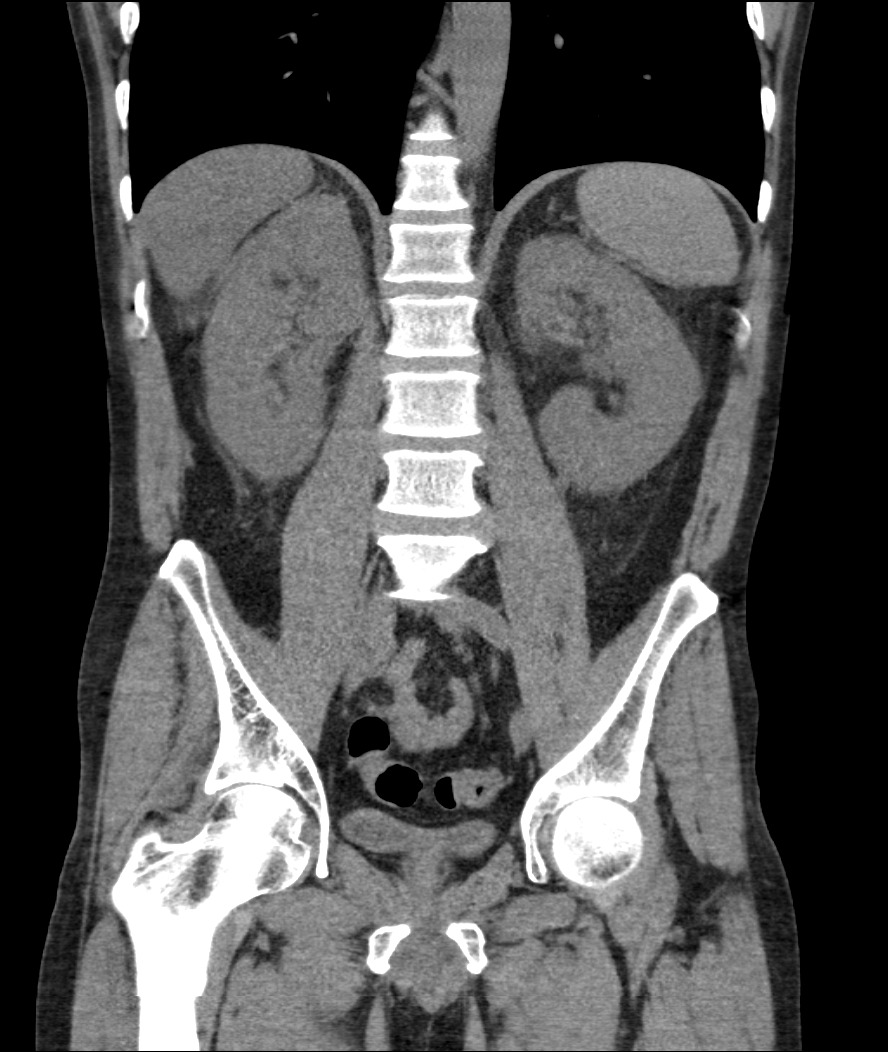

[11 of 46 positions shown; findings below may reference images not displayed]

FINDINGS: Lower chest: No acute abnormality.

Hepatobiliary: Well-circumscribed 13 mm low-attenuation focus within
segment 4B each likely representing a cyst. Hepatic steatosis.
Normal gallbladder. No intra or extrahepatic biliary ductal
dilatation.

Pancreas: Unremarkable. No pancreatic ductal dilatation or
surrounding inflammatory changes.

Spleen: Normal in size without focal abnormality.

Adrenals/Urinary Tract: Mild motion artifact through the kidneys
partially obscures renal parenchyma. Left kidney interpolar cortical
defect with fat attenuation probably representing scarring. 13 mm
intermediate attenuation well-circumscribed exophytic lesion of the
left interpolar kidney. (series 301, image 33). Moderate bilateral
perinephric stranding is nonspecific. No urinary stone disease or
hydronephrosis. Normal bladder.

Stomach/Bowel: Stomach is within normal limits. Appendix appears
normal. No evidence of bowel wall thickening, distention, or
inflammatory changes.

Vascular/Lymphatic: Aortic atherosclerosis. No enlarged abdominal or
pelvic lymph nodes.

Reproductive: Prostate is unremarkable.

Other: No abdominal wall hernia or abnormality. No abdominopelvic
ascites.

Musculoskeletal: No fracture is seen.
IMPRESSION: 1. Motion artifact at level of the kidneys partially obscures renal
parenchyma. No hydronephrosis or urinary stone disease.
2. Moderate bilateral perinephric stranding of uncertain
significance, possibly related to the kidney injury or infection of
the renal collecting system.
3. 13 mm left kidney interpolar exophytic motion degraded
indeterminate lesion, possibly hemorrhagic cyst. Consider renal
ultrasound for further characterization.
4. Aortic atherosclerosis.
5. Hepatic steatosis.

By: Bet Tennyson M.D.

## 2018-06-17 ENCOUNTER — Other Ambulatory Visit: Payer: Self-pay | Admitting: Internal Medicine

## 2018-06-17 DIAGNOSIS — M5432 Sciatica, left side: Secondary | ICD-10-CM

## 2018-06-19 ENCOUNTER — Other Ambulatory Visit: Payer: Self-pay

## 2018-06-22 MED ORDER — ROSUVASTATIN CALCIUM 10 MG PO TABS: 10.0000 mg | ORAL_TABLET | Freq: Every day | ORAL | 1 refills | Status: DC

## 2018-06-24 MED FILL — ROSUVASTATIN CALCIUM 10 MG: 10 | 30 days supply | Qty: 30 | Fill #0

## 2018-06-24 NOTE — Telephone Encounter (Signed)
Called 3 times line is busy

## 2018-06-24 NOTE — Telephone Encounter (Signed)
Schedule pt an appointment

## 2018-07-01 MED FILL — CARVEDILOL 12.5 MG TABLET: 12.5 | 30 days supply | Qty: 60 | Fill #0

## 2018-07-15 MED FILL — ROSUVASTATIN CALCIUM 10 MG: 10 | 30 days supply | Qty: 30 | Fill #0

## 2018-07-15 MED FILL — CARVEDILOL 12.5 MG TABLET: 12.5 | 30 days supply | Qty: 60 | Fill #0

## 2018-09-10 MED FILL — CARVEDILOL 12.5 MG TABLET: 12.5 | 30 days supply | Qty: 60 | Fill #1

## 2018-09-10 MED FILL — ROSUVASTATIN CALCIUM 10 MG: 10 | 30 days supply | Qty: 30 | Fill #1

## 2018-10-03 ENCOUNTER — Ambulatory Visit: Payer: Self-pay | Admitting: Internal Medicine

## 2018-11-13 ENCOUNTER — Other Ambulatory Visit: Payer: Self-pay | Admitting: Internal Medicine

## 2018-11-13 MED FILL — CARVEDILOL 12.5 MG TABLET: 12.5 | 30 days supply | Qty: 60 | Fill #2

## 2018-11-13 MED FILL — FENOFIBRATE 160 MG TABLET: 160 | 30 days supply | Qty: 30 | Fill #4

## 2018-11-22 ENCOUNTER — Encounter: Payer: Self-pay | Admitting: Family Medicine

## 2018-11-22 ENCOUNTER — Telehealth: Payer: Self-pay | Admitting: *Deleted

## 2018-11-22 ENCOUNTER — Ambulatory Visit: Payer: Self-pay | Attending: Family Medicine | Admitting: Family Medicine

## 2018-11-22 ENCOUNTER — Other Ambulatory Visit: Payer: Self-pay

## 2018-11-22 VITALS — BP 126/92 | HR 96 | Temp 99.9°F | Ht 70.0 in | Wt 189.8 lb

## 2018-11-22 DIAGNOSIS — R1030 Lower abdominal pain, unspecified: Secondary | ICD-10-CM

## 2018-11-22 DIAGNOSIS — R630 Anorexia: Secondary | ICD-10-CM

## 2018-11-22 DIAGNOSIS — R509 Fever, unspecified: Secondary | ICD-10-CM

## 2018-11-22 DIAGNOSIS — R350 Frequency of micturition: Secondary | ICD-10-CM

## 2018-11-22 LAB — POCT URINALYSIS DIP (CLINITEK)
Blood, UA: NEGATIVE
Glucose, UA: NEGATIVE mg/dL
Leukocytes, UA: NEGATIVE
Nitrite, UA: NEGATIVE
POC PROTEIN,UA: 30 — AB
Spec Grav, UA: 1.015
Urobilinogen, UA: 1 U/dL
pH, UA: 5.5

## 2018-11-22 NOTE — Telephone Encounter (Signed)
Spoke with patient wife to see if patient went to the ED like he was told to. Per patient wife, when they left the office, they went home. Per pt wife, patient never told her when they left the office that they needed to go to the ED. Per pt wife, she would like to know what's going on because she's his wife and is on his Emergency Contact and she's the one that will be taking him to the ED. Informed patient wife that when patient saw the doctor this morning, patient was c/o of left lower stomach pain and patient also had a fever. Patient wife then stated she will get patient to the ED immediately as soon as she can. Staff informed provider with information that wife stated.

## 2018-11-22 NOTE — Progress Notes (Signed)
Established Patient Office Visit  Subjective:  Patient ID: Stephen Bowers, male    DOB: [birth year]  Age: 58 y.o. MRN: [id number]  CC:  Chief Complaint  Patient presents with  . Abdominal Pain  . Medication Refill    HPI Stephen Bowers presents due to the complaint of lower abdominal pain for at least a week. Pain is in his left lower abdomen and the pain comes and goes. Yesterday and today, pain was a 10 on a 0-10 scale. Pain lasts at least 2 hours and nothing makes the pain better or worse. He denies N/V/D/C. He has had loss of appetite and claims to not have eaten any solid foods for the past 2 days. He has also had a fever for 2 days. He states that he did not tell the person at the clinic entrance about his fever. He has been out of work all week. He thinks that the issues is his kidneys as he has been urinating frequently and has complaint of pain in his left mid-back for about 3 days. Back pain is dull. He denies any chest pain or palpitations, no SOB or cough. No sick contacts.   Past Medical History:  Diagnosis Date  . Chronic musculoskeletal pain   . CKD (chronic kidney disease)    follows with [facility])  . High cholesterol Dx 2011  . Hypertension   . Wears partial dentures    upper only  On review of chart, patient with hospitalization 12/20/2017 due to acute severe pancreatitis likely secondary to alcohol and hypertriglyceridemia, acute kidney injury, hypokalemia, hypomagnesemia, alcohol dependence with acute withdrawal, essential hypertension and severe hypertriglyceridemia.  Patient also with anemia on CBC done on 12/24/2017 with hemoglobin of 11.3 and reduced platelet count of 149.  Past Surgical History:  Procedure Laterality Date  . COLONOSCOPY  12/2014   hx polyps - Pyrtle  . MULTIPLE TOOTH EXTRACTIONS      Family History  Problem Relation Age of Onset  . Liver disease Father   . Alcohol abuse Father   . Diabetes Sister   . Obesity Sister   .  Cancer Mother        breast   . Kidney disease Mother   . Colon cancer Neg Hx   . Esophageal cancer Neg Hx   . Rectal cancer Neg Hx   . Stomach cancer Neg Hx   . Colon polyps Neg Hx     Social History   Tobacco Use  . Smoking status: Former Smoker    Packs/day: 0.25    Years: 6.00    Pack years: 1.50    Types: Cigarettes    Last attempt to quit: 1995    Years since quitting: 25.4  . Smokeless tobacco: Never Used  Substance Use Topics  . Alcohol use: Yes    Alcohol/week: 6.0 standard drinks    Types: 6 Cans of beer per week    Comment: 6 drinks Beer /Liquor  . Drug use: No    Outpatient Medications Prior to Visit  Medication Sig Dispense Refill  . acetaminophen (TYLENOL) 325 MG tablet Take 2 tablets (650 mg total) by mouth every 6 (six) hours as needed. Do not take more than 4000mg  of tylenol per day 30 tablet 0  . carvedilol (COREG) 12.5 MG tablet Take 1 tablet (12.5 mg total) by mouth 2 (two) times daily with a meal. 60 tablet 3  . cholecalciferol (VITAMIN D) 1000 units tablet Take 1 tablet (1,000 Units  total) by mouth daily. 90 tablet 1  . diphenhydramine-acetaminophen (TYLENOL PM) 25-500 MG TABS tablet Take 1 tablet by mouth at bedtime as needed.    . ferrous sulfate 325 (65 FE) MG tablet Take 1 tablet (325 mg total) by mouth daily with breakfast. 90 tablet 1  . trimethoprim-polymyxin b (POLYTRIM) ophthalmic solution Place 1 drop into both eyes every 3 (three) hours while awake. For 7 days. 10 mL 0  . amLODipine (NORVASC) 10 MG tablet Take 10 mg by mouth daily.    . bisacodyl (BISACODYL) 5 MG EC tablet Take 5 mg by mouth daily as needed for moderate constipation. Dulcolax 5 mg tab take as directed for colonoscopy prep.    . fenofibrate 160 MG tablet Take 1 tablet (160 mg total) by mouth daily. (Patient not taking: Reported on 11/22/2018) 30 tablet 4  . folic acid (FOLVITE) 1 MG tablet Take 1 tablet (1 mg total) by mouth daily. (Patient not taking: Reported on 02/21/2018) 90  tablet 0  . meloxicam (MOBIC) 15 MG tablet Take 1 tablet (15 mg total) by mouth daily. TAKE WITH MEALS (Patient not taking: Reported on 02/21/2018) 30 tablet 2  . potassium chloride 20 MEQ TBCR Take 20 mEq by mouth daily for 3 days. 3 tablet 0  . rosuvastatin (CRESTOR) 10 MG tablet Take 1 tablet (10 mg total) by mouth at bedtime. (Patient not taking: Reported on 11/22/2018) 30 tablet 1  . thiamine 100 MG tablet Take 1 tablet (100 mg total) by mouth daily. (Patient not taking: Reported on 02/21/2018) 30 tablet 0   No facility-administered medications prior to visit.     No Known Allergies  ROS Review of Systems  Constitutional: Positive for appetite change, diaphoresis, fatigue and fever. Negative for chills.  HENT: Negative for sore throat and trouble swallowing.   Respiratory: Negative for cough and shortness of breath.   Cardiovascular: Negative for chest pain, palpitations and leg swelling.  Gastrointestinal: Positive for abdominal pain. Negative for blood in stool, constipation, diarrhea, nausea and vomiting.  Endocrine: Positive for polyuria. Negative for polydipsia and polyphagia.  Genitourinary: Positive for frequency. Negative for dysuria.  Musculoskeletal: Positive for back pain. Negative for gait problem.  Neurological: Positive for dizziness, light-headedness and headaches.  Hematological: Negative for adenopathy. Does not bruise/bleed easily.      Objective:    Physical Exam  Constitutional: He is oriented to person, place, and time. He appears well-developed and well-nourished.  Patient became diaphoretic during his visit; patient was wearing a mask but mask was recurrently below his nose and he had to be asked to pull up his mask; no cough  Neck: Neck supple.  Cardiovascular: Normal rate and regular rhythm.  Pulmonary/Chest: Effort normal and breath sounds normal.  Abdominal: Soft. There is no abdominal tenderness (no reproducible left lower quadrant pain; abdomen was  slightly firm throughtout). There is no rebound and no guarding.  Recurrent burping  Musculoskeletal:        General: Tenderness (minimal left mid back discomfort with percussion) present.  Lymphadenopathy:    He has no cervical adenopathy.  Neurological: He is alert and oriented to person, place, and time.  Skin: Skin is warm. He is diaphoretic.  Psychiatric: He has a normal mood and affect. His behavior is normal.  Nursing note and vitals reviewed.   BP (!) 126/92 (BP Location: Right Arm, Patient Position: Sitting, Cuff Size: Normal)   Pulse 96   Temp 99.9 F (37.7 C) (Oral)   Ht 5\' 10"  (  1.778 m)   Wt 189 lb 12.8 oz (86.1 kg)   SpO2 99%   BMI 27.23 kg/m   Initial temperature of 100.5 with recheck of 99.9  Wt Readings from Last 3 Encounters:  03/07/18 186 lb (84.4 kg)  02/21/18 186 lb 3.2 oz (84.5 kg)  12/23/17 197 lb 12 oz (89.7 kg)       No results found for: TSH Lab Results  Component Value Date   WBC 9.5 12/24/2017   HGB 11.3 (L) 12/24/2017   HCT 32.8 (L) 12/24/2017   MCV 94.3 12/24/2017   PLT 149 (L) 12/24/2017   Lab Results  Component Value Date   NA 137 12/24/2017   K 3.9 12/24/2017   CO2 21 (L) 12/24/2017   GLUCOSE 92 12/24/2017   BUN 9 12/24/2017   CREATININE 1.17 12/24/2017   BILITOT 1.2 12/24/2017   ALKPHOS 27 (L) 12/24/2017   AST 73 (H) 12/24/2017   ALT 35 12/24/2017   PROT 6.9 12/24/2017   ALBUMIN 2.9 (L) 12/24/2017   CALCIUM 8.7 (L) 12/24/2017   ANIONGAP 13 12/24/2017   Lab Results  Component Value Date   CHOL 378 (H) 12/20/2017   Lab Results  Component Value Date   HDL 19 (L) 12/20/2017   Lab Results  Component Value Date   LDLCALC UNABLE TO CALCULATE IF TRIGLYCERIDE OVER 400 mg/dL 12/20/2017   Lab Results  Component Value Date   TRIG 1,948 (H) 12/20/2017   Lab Results  Component Value Date   CHOLHDL NOT REPORTED DUE TO HIGH TRIGLYCERIDES 12/20/2017   Lab Results  Component Value Date   HGBA1C 5.5 06/12/2016        Assessment & Plan:   1. Lower abdominal pain Patient with complaint of abdominal pain that is a 10 on a 0-10 scale at times and has occurred over the past week. In exam room, patient is diaphoretic but reports no significant pain. UA did not have leukocytes or nitrites, and no hematuria. He denies constipation or other GI issues and no history of diverticulitis. Patient was asked to go to the ED for further evaluation as he will likely need labs and imaging done within a reasonable time frame which cannot be accomplished here in this office.  - POCT URINALYSIS DIP (CLINITEK) - Urine Culture  2. Fever, unspecified fever cause UA negative for infection. He may also need COVID-19 testing. Due to his fever and left lower quadrant pain, he has been asked to go to the ED for further evaluation - Urine Culture  3. Urinary frequency UA done due to patient's back pain and urinary frequency. UA did not have leukocytes, nitirites or blood. Urine will be sent for culture. - Urine Culture  4. Loss of appetite Due to his loss of appetite, fever and abdominal pain he was asked to go to the ED for further evaluation. Patient also likely with dehydration as he reports being dizzy/lightheaded.     Follow-up: Return for go to the Emergency Department for further evaluation.   Antony Blackbird, MD

## 2018-11-22 NOTE — Progress Notes (Signed)
Per pt his kidneys hurt, per pt the left lower part of his stomach hurts for the past 3 days now

## 2018-11-23 ENCOUNTER — Inpatient Hospital Stay (HOSPITAL_COMMUNITY)
Admission: EM | Admit: 2018-11-23 | Discharge: 2018-11-24 | DRG: 439 | Disposition: A | Discharge status: Home / Self Care | Payer: Self-pay | Attending: Family Medicine | Admitting: Family Medicine

## 2018-11-23 ENCOUNTER — Encounter (HOSPITAL_COMMUNITY): Payer: Self-pay | Admitting: Emergency Medicine

## 2018-11-23 ENCOUNTER — Other Ambulatory Visit: Payer: Self-pay

## 2018-11-23 ENCOUNTER — Emergency Department (HOSPITAL_COMMUNITY): Payer: Self-pay

## 2018-11-23 DIAGNOSIS — D696 Thrombocytopenia, unspecified: Secondary | ICD-10-CM | POA: Diagnosis present

## 2018-11-23 DIAGNOSIS — K760 Fatty (change of) liver, not elsewhere classified: Secondary | ICD-10-CM | POA: Diagnosis present

## 2018-11-23 DIAGNOSIS — E785 Hyperlipidemia, unspecified: Secondary | ICD-10-CM | POA: Diagnosis present

## 2018-11-23 DIAGNOSIS — Z811 Family history of alcohol abuse and dependence: Secondary | ICD-10-CM

## 2018-11-23 DIAGNOSIS — K859 Acute pancreatitis without necrosis or infection, unspecified: Secondary | ICD-10-CM

## 2018-11-23 DIAGNOSIS — R7401 Elevation of levels of liver transaminase levels: Secondary | ICD-10-CM

## 2018-11-23 DIAGNOSIS — N189 Chronic kidney disease, unspecified: Secondary | ICD-10-CM | POA: Diagnosis present

## 2018-11-23 DIAGNOSIS — I129 Hypertensive chronic kidney disease with stage 1 through stage 4 chronic kidney disease, or unspecified chronic kidney disease: Secondary | ICD-10-CM | POA: Diagnosis present

## 2018-11-23 DIAGNOSIS — R7303 Prediabetes: Secondary | ICD-10-CM | POA: Diagnosis present

## 2018-11-23 DIAGNOSIS — Z79899 Other long term (current) drug therapy: Secondary | ICD-10-CM

## 2018-11-23 DIAGNOSIS — K852 Alcohol induced acute pancreatitis without necrosis or infection: Principal | ICD-10-CM | POA: Diagnosis present

## 2018-11-23 DIAGNOSIS — F1010 Alcohol abuse, uncomplicated: Secondary | ICD-10-CM | POA: Diagnosis present

## 2018-11-23 DIAGNOSIS — Z1159 Encounter for screening for other viral diseases: Secondary | ICD-10-CM

## 2018-11-23 DIAGNOSIS — Z833 Family history of diabetes mellitus: Secondary | ICD-10-CM

## 2018-11-23 DIAGNOSIS — E871 Hypo-osmolality and hyponatremia: Secondary | ICD-10-CM | POA: Diagnosis present

## 2018-11-23 DIAGNOSIS — K709 Alcoholic liver disease, unspecified: Secondary | ICD-10-CM | POA: Diagnosis present

## 2018-11-23 DIAGNOSIS — Z87891 Personal history of nicotine dependence: Secondary | ICD-10-CM

## 2018-11-23 DIAGNOSIS — E7800 Pure hypercholesterolemia, unspecified: Secondary | ICD-10-CM | POA: Diagnosis present

## 2018-11-23 DIAGNOSIS — E876 Hypokalemia: Secondary | ICD-10-CM | POA: Diagnosis present

## 2018-11-23 HISTORY — DX: Ulcerative (chronic) pancolitis without complications: K51.00

## 2018-11-23 LAB — COMPREHENSIVE METABOLIC PANEL
ALT: 130 U/L — ABNORMAL HIGH (ref 0–44)
AST: 192 U/L — ABNORMAL HIGH (ref 15–41)
Albumin: 3.5 g/dL (ref 3.5–5.0)
Alkaline Phosphatase: 51 U/L (ref 38–126)
Anion gap: 15 (ref 5–15)
BUN: 11 mg/dL (ref 6–20)
CO2: 21 mmol/L — ABNORMAL LOW (ref 22–32)
Calcium: 9.2 mg/dL (ref 8.9–10.3)
Chloride: 95 mmol/L — ABNORMAL LOW (ref 98–111)
Creatinine, Ser: 0.82 mg/dL (ref 0.61–1.24)
GFR calc Af Amer: >60 mL/min (ref >60)
GFR calc non Af Amer: >60 mL/min (ref >60)
Glucose, Bld: 128 mg/dL — ABNORMAL HIGH (ref 70–99)
Potassium: 3.4 mmol/L — ABNORMAL LOW (ref 3.5–5.1)
Sodium: 131 mmol/L — ABNORMAL LOW (ref 135–145)
Total Bilirubin: 1.3 mg/dL — ABNORMAL HIGH (ref 0.3–1.2)
Total Protein: 8.5 g/dL — ABNORMAL HIGH (ref 6.5–8.1)

## 2018-11-23 LAB — CBC WITH DIFFERENTIAL/PLATELET
Abs Immature Granulocytes: 0.12 10*3/uL — ABNORMAL HIGH (ref 0.00–0.07)
Basophils Absolute: 0 10*3/uL (ref 0.0–0.1)
Basophils Relative: 1 %
Eosinophils Absolute: 0.1 10*3/uL (ref 0.0–0.5)
Eosinophils Relative: 1 %
HCT: 36.1 % — ABNORMAL LOW (ref 39.0–52.0)
Hemoglobin: 13 g/dL (ref 13.0–17.0)
Immature Granulocytes: 1 %
Lymphocytes Relative: 13 %
Lymphs Abs: 1.1 10*3/uL (ref 0.7–4.0)
MCH: 33.6 pg (ref 26.0–34.0)
MCHC: 36 g/dL (ref 30.0–36.0)
MCV: 93.3 fL (ref 80.0–100.0)
Monocytes Absolute: 1.1 10*3/uL — ABNORMAL HIGH (ref 0.1–1.0)
Monocytes Relative: 13 %
Neutro Abs: 5.9 10*3/uL (ref 1.7–7.7)
Neutrophils Relative %: 71 %
Platelets: 126 10*3/uL — ABNORMAL LOW (ref 150–400)
RBC: 3.87 MIL/uL — ABNORMAL LOW (ref 4.22–5.81)
RDW: 12.2 % (ref 11.5–15.5)
WBC: 8.3 10*3/uL (ref 4.0–10.5)
nRBC: 0 % (ref 0.0–0.2)

## 2018-11-23 LAB — URINALYSIS, ROUTINE W REFLEX MICROSCOPIC
Bilirubin Urine: NEGATIVE
Glucose, UA: NEGATIVE mg/dL
Hgb urine dipstick: NEGATIVE
Ketones, ur: NEGATIVE mg/dL
Leukocytes,Ua: NEGATIVE
Nitrite: NEGATIVE
Protein, ur: 30 mg/dL — AB
Specific Gravity, Urine: 1.018 (ref 1.005–1.030)
pH: 5 (ref 5.0–8.0)

## 2018-11-23 LAB — LIPASE, BLOOD: Lipase: 188 U/L — ABNORMAL HIGH (ref 11–51)

## 2018-11-23 LAB — SARS CORONAVIRUS 2 BY RT PCR (HOSPITAL ORDER, PERFORMED IN ~~LOC~~ HOSPITAL LAB): SARS Coronavirus 2: NEGATIVE

## 2018-11-23 MED ORDER — ACETAMINOPHEN 325 MG PO TABS: 650.0000 mg | ORAL_TABLET | Freq: Four times a day (QID) | ORAL | Status: DC | PRN

## 2018-11-23 MED ORDER — HYDROMORPHONE HCL 1 MG/ML IJ SOLN
1.0000 mg | Freq: Once | INTRAMUSCULAR | Status: AC
  Administered 2018-11-23: 1 mg via INTRAVENOUS
  Filled 2018-11-23: qty 1

## 2018-11-23 MED ORDER — POTASSIUM CHLORIDE CRYS ER 20 MEQ PO TBCR
40.0000 meq | EXTENDED_RELEASE_TABLET | Freq: Once | ORAL | Status: AC
  Administered 2018-11-23: 40 meq via ORAL
  Filled 2018-11-23: qty 2

## 2018-11-23 MED ORDER — MORPHINE SULFATE (PF) 4 MG/ML IV SOLN
4.0000 mg | Freq: Once | INTRAVENOUS | Status: AC
  Administered 2018-11-23: 4 mg via INTRAVENOUS
  Filled 2018-11-23: qty 1

## 2018-11-23 MED ORDER — HYDROMORPHONE HCL 1 MG/ML IJ SOLN
0.5000 mg | INTRAMUSCULAR | Status: DC | PRN
  Administered 2018-11-24: 0.5 mg via INTRAVENOUS
  Filled 2018-11-23: qty 1

## 2018-11-23 MED ORDER — SODIUM CHLORIDE 0.9 % IV BOLUS
1000.0000 mL | Freq: Once | INTRAVENOUS | Status: AC
  Administered 2018-11-23: 1000 mL via INTRAVENOUS

## 2018-11-23 MED ORDER — THIAMINE HCL 100 MG/ML IJ SOLN: 100.0000 mg | Freq: Every day | INTRAMUSCULAR | Status: DC

## 2018-11-23 MED ORDER — VITAMIN B-1 100 MG PO TABS
100.0000 mg | ORAL_TABLET | Freq: Every day | ORAL | Status: DC
  Administered 2018-11-24: 100 mg via ORAL
  Filled 2018-11-23: qty 1

## 2018-11-23 MED ORDER — POLYETHYLENE GLYCOL 3350 17 G PO PACK: 17.0000 g | PACK | Freq: Every day | ORAL | Status: DC | PRN

## 2018-11-23 MED ORDER — IOHEXOL 300 MG/ML  SOLN
100.0000 mL | Freq: Once | INTRAMUSCULAR | Status: AC | PRN
  Administered 2018-11-23: 100 mL via INTRAVENOUS

## 2018-11-23 MED ORDER — ENOXAPARIN SODIUM 40 MG/0.4ML ~~LOC~~ SOLN
40.0000 mg | SUBCUTANEOUS | Status: DC
  Administered 2018-11-23: 40 mg via SUBCUTANEOUS
  Filled 2018-11-23: qty 0.4

## 2018-11-23 MED ORDER — LORAZEPAM 1 MG PO TABS: 0.0000 mg | ORAL_TABLET | Freq: Two times a day (BID) | ORAL | Status: DC

## 2018-11-23 MED ORDER — HYDROMORPHONE HCL 1 MG/ML IJ SOLN: 0.5000 mg | INTRAMUSCULAR | Status: DC

## 2018-11-23 MED ORDER — LORAZEPAM 1 MG PO TABS
0.0000 mg | ORAL_TABLET | Freq: Four times a day (QID) | ORAL | Status: DC
  Administered 2018-11-23: 1 mg via ORAL
  Filled 2018-11-23: qty 1

## 2018-11-23 MED ORDER — ADULT MULTIVITAMIN W/MINERALS CH
1.0000 | ORAL_TABLET | Freq: Every day | ORAL | Status: DC
  Administered 2018-11-24: 1 via ORAL
  Filled 2018-11-23: qty 1

## 2018-11-23 MED ORDER — CARVEDILOL 12.5 MG PO TABS
12.5000 mg | ORAL_TABLET | Freq: Two times a day (BID) | ORAL | Status: DC
  Administered 2018-11-24 (×2): 12.5 mg via ORAL
  Filled 2018-11-23 (×2): qty 1

## 2018-11-23 MED ORDER — LORAZEPAM 1 MG PO TABS: 1.0000 mg | ORAL_TABLET | Freq: Four times a day (QID) | ORAL | Status: DC | PRN

## 2018-11-23 MED ORDER — FOLIC ACID 1 MG PO TABS
1.0000 mg | ORAL_TABLET | Freq: Every day | ORAL | Status: DC
  Administered 2018-11-24: 1 mg via ORAL
  Filled 2018-11-23: qty 1

## 2018-11-23 MED ORDER — ACETAMINOPHEN 650 MG RE SUPP: 650.0000 mg | Freq: Four times a day (QID) | RECTAL | Status: DC | PRN

## 2018-11-23 MED ORDER — TETRAHYDROZOLINE HCL 0.05 % OP SOLN
1.0000 [drp] | Freq: Every day | OPHTHALMIC | Status: DC
  Administered 2018-11-24: 1 [drp] via OPHTHALMIC
  Filled 2018-11-23: qty 15

## 2018-11-23 MED ORDER — ONDANSETRON HCL 4 MG PO TABS: 4.0000 mg | ORAL_TABLET | ORAL | Status: DC | PRN

## 2018-11-23 MED ORDER — LORAZEPAM 2 MG/ML IJ SOLN: 1.0000 mg | Freq: Four times a day (QID) | INTRAMUSCULAR | Status: DC | PRN

## 2018-11-23 MED ORDER — AMLODIPINE BESYLATE 10 MG PO TABS
10.0000 mg | ORAL_TABLET | Freq: Every day | ORAL | Status: DC
  Administered 2018-11-24: 10 mg via ORAL
  Filled 2018-11-23: qty 1

## 2018-11-23 NOTE — ED Notes (Signed)
Report given to [phone]

## 2018-11-23 NOTE — ED Triage Notes (Addendum)
Pt here for eval of 3 days of lower back pain. Pt denies dysuria. Pt also reports LLQ abdominal tenderness, denies hx of diverticulitis, no diarrhea. PT diaphoretic at check in. States "I have been sweating like this." febrile at doctors office. [phone] Sheryl.   *Per wife pt does have pancolitis.

## 2018-11-23 NOTE — ED Provider Notes (Signed)
[facility] Provider Note   CSN: [id number] Arrival date & time: 11/23/18  1607    History   Chief Complaint Chief Complaint  Patient presents with   Abdominal Pain   Back Pain    HPI    Stephen Bowers is a 58 y.o. male with a PMHx of CKD, chronic musculoskeletal pain, HTN, HLD, pancolitis, pancreatitis, and other conditions listed below, who presents to the ED with complaints of LLQ pain that began 3 days ago.  He describes his pain as 7/10 intermittent aching left lower quadrant pain that radiates into the left lower back, worse with bending or lifting anything, and unrelieved with Tylenol.  He reports associated increased urinary frequency.  He denies having any fevers, chills, cough, URI symptoms, chest pain, shortness of breath, nausea, vomiting, diarrhea, constipation, melena, hematochezia, obstipation, dysuria, hematuria, malodorous urine, numbness, tingling, focal weakness, or any other complaints at this time.  Of note, chart review reveals he was seen at the [facility] (PCP) yesterday for these complaints, had a temp of 99.9 there, they performed a U/A which was not indicative of UTI, UCx was sent and is in process, they instructed him to go to the ED. He states he could not come at that time, but came today instead.  He mentions he had 5 bowel movements this morning which were all "normal", denies diarrhea.  He drank 2-3 beers 3 days ago, but denies drinking frequently, states drinks "weekly".  He denies any recent travel, sick contacts, suspicious food intake, NSAID use, or prior abdominal surgeries.  He denies history of diverticulitis.    The history is provided by the patient and medical records. No language interpreter was used.  Abdominal Pain  Associated symptoms: no chest pain, no chills, no constipation, no cough, no diarrhea, no dysuria, no fever, no hematuria, no nausea, no shortness of breath, no sore throat and no vomiting   Back  Pain  Associated symptoms: abdominal pain   Associated symptoms: no chest pain, no dysuria, no fever, no numbness and no weakness     Past Medical History:  Diagnosis Date   Chronic musculoskeletal pain    CKD (chronic kidney disease)    follows with [facility] Hollie Salk)   High cholesterol Dx 2011   Hypertension    Pancolitis ([place])    Wears partial dentures    upper only    Patient Active Problem List   Diagnosis Date Noted   Acute pancreatitis 12/20/2017   Increased anion gap metabolic acidosis    Severe sepsis ([facility])    Abnormal LFTs 12/04/2017   Pancreatitis 12/04/2017   Hyperlipidemia 10/16/2016   AKI (acute kidney injury) ([place])    HTN (hypertension) 03/02/2015   Vitamin D deficiency 01/19/2015   Low back pain potentially associated with radiculopathy 03/30/2014    Past Surgical History:  Procedure Laterality Date   COLONOSCOPY  12/2014   hx polyps - [place]rt[place]   MULTIPLE TOOTH EXTRACTIONS          Home Medications    Prior to Admission medications   Medication Sig Start Date End Date Taking? Authorizing Provider  acetaminophen (TYLENOL) 325 MG tablet Take 2 tablets (650 mg total) by mouth every 6 (six) hours as needed. Do not take more than 4000mg  of tylenol per day 10/12/17   Couture, Cortni S, PA-C  amLODipine (NORVASC) 10 MG tablet Take 10 mg by mouth daily.    [provider]  bisacodyl (BISACODYL) 5 MG EC  tablet Take 5 mg by mouth daily as needed for moderate constipation. Dulcolax 5 mg tab take as directed for colonoscopy prep.    [provider]  carvedilol (COREG) 12.5 MG tablet Take 1 tablet (12.5 mg total) by mouth 2 (two) times daily with a meal. 12/24/17   Rai, Ripudeep K, MD  cholecalciferol (VITAMIN D) 1000 units tablet Take 1 tablet (1,000 Units total) by mouth daily. 06/08/17   Ladell Pier, MD  diphenhydramine-acetaminophen (TYLENOL PM) 25-500 MG TABS tablet Take 1 tablet by mouth at bedtime as needed.     [provider]  fenofibrate 160 MG tablet Take 1 tablet (160 mg total) by mouth daily. Patient not taking: Reported on 11/22/2018 12/24/17   Mendel Corning, MD  ferrous sulfate 325 (65 FE) MG tablet Take 1 tablet (325 mg total) by mouth daily with breakfast. 06/08/17   Ladell Pier, MD  folic acid (FOLVITE) 1 MG tablet Take 1 tablet (1 mg total) by mouth daily. Patient not taking: Reported on 02/21/2018 01/30/18   Ladell Pier, MD  meloxicam (MOBIC) 15 MG tablet Take 1 tablet (15 mg total) by mouth daily. TAKE WITH MEALS Patient not taking: Reported on 02/21/2018 12/04/17   Ladell Pier, MD  potassium chloride 20 MEQ TBCR Take 20 mEq by mouth daily for 3 days. 12/24/17 12/27/17  Rai, Vernelle Emerald, MD  rosuvastatin (CRESTOR) 10 MG tablet Take 1 tablet (10 mg total) by mouth at bedtime. Patient not taking: Reported on 11/22/2018 06/22/18   Ladell Pier, MD  thiamine 100 MG tablet Take 1 tablet (100 mg total) by mouth daily. Patient not taking: Reported on 02/21/2018 12/25/17   Mendel Corning, MD  trimethoprim-polymyxin b (POLYTRIM) ophthalmic solution Place 1 drop into both eyes every 3 (three) hours while awake. For 7 days. 06/20/17   Alfonse Spruce, FNP    Family History Family History  Problem Relation Age of Onset   Liver disease Father    Alcohol abuse Father    Diabetes Sister    Obesity Sister    Cancer Mother        breast    Kidney disease Mother    Colon cancer Neg Hx    Esophageal cancer Neg Hx    Rectal cancer Neg Hx    Stomach cancer Neg Hx    Colon polyps Neg Hx     Social History Social History   Tobacco Use   Smoking status: Former Smoker    Packs/day: 0.25    Years: 6.00    Pack years: 1.50    Types: Cigarettes    Last attempt to quit: 1995    Years since quitting: 25.4   Smokeless tobacco: Never Used  Substance Use Topics   Alcohol use: Yes    Alcohol/week: 6.0 standard drinks    Types: 6 Cans of beer per week     Comment: 6 drinks Beer /Liquor   Drug use: No     Allergies   Patient has no known allergies.   Review of Systems Review of Systems  Constitutional: Negative for chills and fever.  HENT: Negative for rhinorrhea and sore throat.   Respiratory: Negative for cough and shortness of breath.   Cardiovascular: Negative for chest pain.  Gastrointestinal: Positive for abdominal pain. Negative for blood in stool, constipation, diarrhea, nausea and vomiting.  Genitourinary: Positive for frequency. Negative for dysuria and hematuria.       No malodorous urine  Musculoskeletal: Positive  for back pain. Negative for arthralgias and myalgias.  Skin: Negative for color change.  Allergic/Immunologic: Negative for immunocompromised state.  Neurological: Negative for weakness and numbness.  Psychiatric/Behavioral: Negative for confusion.   All other systems reviewed and are negative for acute change except as noted in the HPI.    Physical Exam Updated Vital Signs BP (!) 138/94    Pulse 99    Temp 98.7 F (37.1 C) (Oral)    Resp 18    Ht 5\' 11"  (1.803 m)    Wt 85.7 kg    SpO2 99%    BMI 26.36 kg/m   Physical Exam Vitals signs and nursing note reviewed.  Constitutional:      General: He is not in acute distress.    Appearance: Normal appearance. He is well-developed. He is not toxic-appearing.     Comments: Afebrile, nontoxic, NAD, not diaphoretic on exam, well appearing  HENT:     Head: Normocephalic and atraumatic.  Eyes:     General:        Right eye: No discharge.        Left eye: No discharge.     Conjunctiva/sclera: Conjunctivae normal.  Neck:     Musculoskeletal: Normal range of motion and neck supple.  Cardiovascular:     Rate and Rhythm: Normal rate and regular rhythm.     Pulses: Normal pulses.     Heart sounds: Normal heart sounds, S1 normal and S2 normal. No murmur. No friction rub. No gallop.   Pulmonary:     Effort: Pulmonary effort is normal. No respiratory distress.      Breath sounds: Normal breath sounds. No decreased breath sounds, wheezing, rhonchi or rales.  Abdominal:     General: Bowel sounds are normal. There is no distension.     Palpations: Abdomen is soft. Abdomen is not rigid.     Tenderness: There is abdominal tenderness in the left lower quadrant. There is guarding. There is no right CVA tenderness, left CVA tenderness or rebound. Negative signs include Murphy's sign and McBurney's sign.       Comments: Soft, protuberant but nondistended, +BS throughout, with moderate LLQ TTP tracking towards the L flank area with some slight guarding but no rebound or rigidity, neg murphy's, neg mcburney's, no focal CVA TTP but tenderness extends to the flank region.    Musculoskeletal: Normal range of motion.  Skin:    General: Skin is warm and dry.     Findings: No rash.  Neurological:     Mental Status: He is alert and oriented to person, place, and time.     Sensory: Sensation is intact. No sensory deficit.     Motor: Motor function is intact.  Psychiatric:        Mood and Affect: Mood and affect normal.        Behavior: Behavior normal.      ED Treatments / Results  Labs (all labs ordered are listed, but only abnormal results are displayed) Labs Reviewed  CBC WITH DIFFERENTIAL/PLATELET - Abnormal; Notable for the following components:      Result Value   RBC 3.87 (*)    HCT 36.1 (*)    Platelets 126 (*)    Monocytes Absolute 1.1 (*)    Abs Immature Granulocytes 0.12 (*)    All other components within normal limits  COMPREHENSIVE METABOLIC PANEL - Abnormal; Notable for the following components:   Sodium 131 (*)    Potassium 3.4 (*)    Chloride  95 (*)    CO2 21 (*)    Glucose, Bld 128 (*)    Total Protein 8.5 (*)    AST 192 (*)    ALT 130 (*)    Total Bilirubin 1.3 (*)    All other components within normal limits  LIPASE, BLOOD - Abnormal; Notable for the following components:   Lipase 188 (*)    All other components within normal  limits  URINALYSIS, ROUTINE W REFLEX MICROSCOPIC - Abnormal; Notable for the following components:   Color, Urine AMBER (*)    APPearance HAZY (*)    Protein, ur 30 (*)    Bacteria, UA RARE (*)    Non Squamous Epithelial 0-5 (*)    All other components within normal limits  URINE CULTURE    EKG None  Radiology Ct Abdomen Pelvis W Contrast  Result Date: 11/23/2018 CLINICAL DATA:  Three days of low back pain and LEFT lower quadrant abdominal tenderness. EXAM: CT ABDOMEN AND PELVIS WITH CONTRAST TECHNIQUE: Multidetector CT imaging of the abdomen and pelvis was performed using the standard protocol following bolus administration of intravenous contrast. CONTRAST:  143mL OMNIPAQUE IOHEXOL 300 MG/ML  SOLN COMPARISON:  CT abdomen dated 12/20/2017. FINDINGS: Lower chest: No acute abnormality. Hepatobiliary: Liver is diffusely low in density indicating fatty infiltration. Gallbladder appears normal. No bile duct dilatation. Pancreas: Ill-defined fluid/edema about the pancreatic body and pancreatic tail, consistent with acute pancreatitis. Spleen: Spleen appears normal. Adrenals/Urinary Tract: Adrenal glands appear normal. Kidneys are unremarkable without mass, stone or hydronephrosis. No ureteral or bladder calculi identified. Bladder appears normal. Stomach/Bowel: No dilated large or small bowel loops. No evidence of bowel wall inflammation. Appendix is normal. Stomach is unremarkable. Vascular/Lymphatic: Aortic atherosclerosis. No acute appearing vascular abnormality. No enlarged lymph nodes seen. Reproductive: Prostate is unremarkable. Other: No abscess collection seen. No free intraperitoneal air. Musculoskeletal: No acute or suspicious osseous finding. IMPRESSION: 1. Acute pancreatitis. Ill-defined fluid/edema about the pancreatic body and pancreatic tail. No evidence of pancreatic necrosis. No pseudocyst formation seen. 2. Fatty infiltration of the liver. Aortic Atherosclerosis (ICD10-I70.0).  Electronically Signed   By: Franki Cabot M.D.   On: 11/23/2018 18:25    Procedures Procedures (including critical care time)  Medications Ordered in ED Medications  HYDROmorphone (DILAUDID) injection 1 mg (has no administration in time range)  morphine 4 MG/ML injection 4 mg (4 mg Intravenous Given 11/23/18 1720)  sodium chloride 0.9 % bolus 1,000 mL (0 mLs Intravenous Stopped 11/23/18 1853)  iohexol (OMNIPAQUE) 300 MG/ML solution 100 mL (100 mLs Intravenous Contrast Given 11/23/18 1810)     Initial Impression / Assessment and Plan / ED Course  I have reviewed the triage vital signs and the nursing notes.  Pertinent labs & imaging results that were available during my care of the patient were reviewed by me and considered in my medical decision making (see chart for details).        58 y.o. male here with left lower quadrant pain that began 3 days ago radiating into his left lower back.  Was seen by his PCP yesterday and had a temp of 99.9, urinalysis was not indicative of UTI, urine culture pending.  They advised him to come to the ER at that time but he did not, he decided to come today.  On exam, afebrile, nontoxic-appearing, with moderate LLQ TTP with slight guarding, no rebound or rigidity, mild tenderness tracking towards the left flank area but no CVA tenderness.  Differential diagnosis includes diverticulitis versus kidney  stone versus pancreatitis, etc. Will get labs and CT abd/pelv, give fluids/pain meds, and reassess shortly.   7:23 PM CBC w/diff with mild thrombocytopenia similar to prior (plt 126) but otherwise unremarkable. CMP with Na 131, K 3.4, Cl 95, AST 192, ALT 130, Tbili 1.3. Lipase 188. U/A without evidence of UTI. CT abd/pelv with acute pancreatitis and ill defined fluid/edema about the pancreatic body and tail, no evidence of necrosis, no pseudocyst formation seen. Pt still having pain. Will give more pain meds but admit for pain control of his acute pancreatitis with  ill defined fluid/edema and also hyponatremia. Discussed case with my attending Dr. Sedonia Small who agrees with plan.   7:34 PM Dr. Andy Gauss of [facility] residency returning page and will admit. Holding orders to be placed by admitting team. Please see their notes for further documentation of care. I appreciate their help with this pleasant pt's care. Pt stable at time of admission.    Final Clinical Impressions(s) / ED Diagnoses   Final diagnoses:  Alcohol-induced acute pancreatitis, unspecified complication status  Transaminitis  Hyponatremia  Thrombocytopenia [facility])    ED Discharge Orders    [address], [place], [place] 11/23/18 1934    Maudie Flakes, MD 11/24/18 1104

## 2018-11-23 NOTE — H&P (Addendum)
[facility] Admission History and Physical Service Pager: [phone]  Patient name: Stephen Bowers Medical record number: [id number] Date of birth: 1960/09/22 Age: 58 y.o. Gender: male  Primary Care Provider: Ladell Pier, MD Consultants: none Code Status: Full Preferred Emergency Contact: [phone]  Chief Complaint: Left sided abdominal pain  Assessment and Plan: Stephen Bowers is a 58 y.o. male replacement history significant for hypertension, hyperlipidemia, history of pancreatitis, alcohol abuse who presented today complaining of left-sided abdominal pain radiating to his back consistent with acute presentation of pancreatitis.  #Left-sided abdominal pain 2/2 acute pancreatitis Patient presents with 3 days of left-sided abdominal pain radiating to his back.  Patient reports decreased p.o. intake and has been drinking more water without any significant change in abdominal pain.  Denies any nausea, vomiting, diarrhea but does report increased bowel movements.  Patient has been taking Tylenol with no relief in pain.  On admission lipase was elevated to 188 and CT abdomen showed fluid and edema around the pancreatic body and tail with no evidence of necrosis or pseudocyst.  Finding consistent with acute pancreatitis.  No other red flags concerning for systemic infection except for some mild tachycardia initially.  WBC 8.3.  Patient endorses history of alcohol use which is most likely etiology.  AST and ALT as well as bilirubin level were mildly elevated consistent with fatty liver with possible component of alcoholic liver disease. Lipid panel from June 2019 showed a fairly elevated triglyceride level 1948.  Suspect presentation is likely multifactorial with alcohol abuse and likely abnormal lipid panel with possible hypertriglyceridemia. Patient still complaining of 7/10 abdominal pain.  Will admit for pain control.  --Admit to FMTS, admitting physician Dr.  Owens Shark --Place patient MedSurg --Clear liquid diet as patient is requesting food, will advance as tolerated --Status post Dilaudid and morphine in the ED --Dilaudid 0.5 q4 prn for 3 doses, likely transition to p.o. in the morning --Follow-up on lipid panel, CMP, CBC, A1c, TSH --MiraLAX as needed --Acetaminophen 650 mg q6 as needed --Zofran 4 mg every 4 as needed  #Hypertension, chronic Patient with history of hypertension currently on amlodipine and carvedilol at home.  On admission blood pressure was 143/107 and continue to be elevated throughout the evening.  Patient did not take blood pressure meds this morning.  Suspect elevated blood pressure is secondary to medication nonadherence and pain. --Amlodipine 10 mg nightly --Carvedilol 12.5 mg twice daily restarted on 5/24 --Continue to monitor  #Transaminitis AST 192, ALT 133 bili 1.3.  Evidence of fatty liver on CT.  Patient also with a history of alcohol use.  Transaminitis likely secondary to acute illness in the setting of pancreatitis in combination with fatty liver disease.  No evidence of jaundice, asterixis or other stigmata of ALD. --Follow-up on a.m. CMP  #Electrolyte abnormalities Patient with initial sodium of 131 and potassium of 3.4.  Patient reports that he has had very poor p.o. intake for the past 3 to 4 days since the pain started.  He has been drinking a lot of water to make up for decreased p.o. intake.  Suspect hyponatremia is likely secondary to dilution.  Mild hypokalemia in the setting of poor p.o. --K-Dur 40 mEq x 1 --Follow-up on magnesium and phosphorus --Follow-up on a.m. CMP and CBC --Repeat as needed  #Thrombocytopenia, mild Patient is platelets of 126 on admission.  Remaining CBC within normal limits.  Most likely etiology alcohol abuse.  Also given our differential possible progression and fatty liver  disease, dillution. --Follow-up on a.m. CBC   #Hyperlipidemia Patient last lipid panel showed a  triglyceride of 1948 and a cholesterol of 378.  Patient was previously on Crestor and fenofibrate but reports that PCP took him off because he was improving.  Given history of pancreatitis and acute presentation suspect lipid panel to be likely abnormal. --Follow-up on lipid panel --Follow-up on TSH  #Alcohol abuse Patient reports that he drinks 3 beer and some brandy 3-4 times a week.  He has been doing so for many years.  No history of alcohol withdrawal.  Last drink was Thursday evening. --We will place patient on CIWA protocol --Alcohol cessation counseling prior to discharge   FEN/GI: Clear liquid diet Prophylaxis: Lovenox  Disposition: Home pending clinical improvement  History of Present Illness:  Stephen Bowers is a 58 y.o. male with a past medical history significant for hypertension, hyperlipidemia, history of pancreatitis, alcohol abuse who presents today complaining of left-sided abdominal pain.  Patient reports that symptoms started 3 days ago.  Pain is mostly located on the left side of his abdomen radiating to his back.  Patient rates pain 10/10 on admission.  Patient does report a history of pancreatitis with most recent admission a year ago in June 2019.Patient has tried Tylenol for pain control with no relief in symptoms.  Patient also endorses poor p.o. intake but has been drinking plenty of water.  He denies any nausea, vomiting, diarrhea but reports increased bowel movement of normal consistency.  Patient last alcohol use was on Thursday evening (3 beers and small glass of brandy).  Patient denies any fevers, chills, chest pain, shortness of breath, dizziness or headache. In the ED, patient received morphine and Dilaudid for pain.  Abdominal CT showed evidence of fluid and edema around the body and tail of the pancreas with no evidence of pseudocyst or necrosis.  Patient with mild electrolytes abnormalities and mildly elevated lipase.  COVID was negative.  Patient was also  mildly elevated blood pressure with some initial tachycardia.  Patient was admitted to medicine team for pain control and further management of acute pancreatitis. Review Of Systems: Per HPI with the following additions  ROS  Patient Active Problem List   Diagnosis Date Noted  . Acute pancreatitis 12/20/2017  . Increased anion gap metabolic acidosis   . Severe sepsis ([place])   . Abnormal LFTs 12/04/2017  . Pancreatitis 12/04/2017  . Hyperlipidemia 10/16/2016  . AKI (acute kidney injury) ([place])   . HTN (hypertension) 03/02/2015  . Vitamin D deficiency 01/19/2015  . Low back pain potentially associated with radiculopathy 03/30/2014    Past Medical History: Past Medical History:  Diagnosis Date  . Chronic musculoskeletal pain   . CKD (chronic kidney disease)    follows with [facility])  . High cholesterol Dx 2011  . Hypertension   . Pancolitis ([place])   . Wears partial dentures    upper only    Past Surgical History: Past Surgical History:  Procedure Laterality Date  . COLONOSCOPY  12/2014   hx polyps - [place]  . MULTIPLE TOOTH EXTRACTIONS      Social History: Social History   Tobacco Use  . Smoking status: Former Smoker    Packs/day: 0.25    Years: 6.00    Pack years: 1.50    Types: Cigarettes    Last attempt to quit: 1995    Years since quitting: 25.4  . Smokeless tobacco: Never Used  Substance Use Topics  . Alcohol use:  Yes    Alcohol/week: 6.0 standard drinks    Types: 6 Cans of beer per week    Comment: 6 drinks Beer /Liquor  . Drug use: No   Additional social history:  Please also refer to relevant sections of EMR.  Family History: Family History  Problem Relation Age of Onset  . Liver disease Father   . Alcohol abuse Father   . Diabetes Sister   . Obesity Sister   . Cancer Mother        breast   . Kidney disease Mother   . Colon cancer Neg Hx   . Esophageal cancer Neg Hx   . Rectal cancer Neg Hx   . Stomach cancer Neg Hx   . Colon  polyps Neg Hx    (If not completed, MUST add something in)  Allergies and Medications: No Known Allergies No current facility-administered medications on file prior to encounter.    Current Outpatient Medications on File Prior to Encounter  Medication Sig Dispense Refill  . acetaminophen (TYLENOL) 500 MG tablet Take 1,000 mg by mouth daily.    [name] amLODipine (NORVASC) 10 MG tablet Take 10 mg by mouth daily.    . carvedilol (COREG) 12.5 MG tablet Take 1 tablet (12.5 mg total) by mouth 2 (two) times daily with a meal. 60 tablet 3  . cholecalciferol (VITAMIN D) 1000 units tablet Take 1 tablet (1,000 Units total) by mouth daily. (Patient taking differently: Take 1,000 Units by mouth 2 (two) times a week. ) 90 tablet 1  . diphenhydramine-acetaminophen (TYLENOL PM) 25-500 MG TABS tablet Take 1 tablet by mouth at bedtime.     . Multiple Vitamin (MULTIVITAMIN WITH MINERALS) TABS tablet Take 1 tablet by mouth daily.    . Tetrahydrozoline HCl (VISINE OP) Place 1 drop into both eyes daily.    [name] acetaminophen (TYLENOL) 325 MG tablet Take 2 tablets (650 mg total) by mouth every 6 (six) hours as needed. Do not take more than 4000mg  of tylenol per day (Patient not taking: Reported on 11/23/2018) 30 tablet 0  . fenofibrate 160 MG tablet Take 1 tablet (160 mg total) by mouth daily. (Patient not taking: Reported on 11/22/2018) 30 tablet 4  . ferrous sulfate 325 (65 FE) MG tablet Take 1 tablet (325 mg total) by mouth daily with breakfast. (Patient not taking: Reported on 11/23/2018) 90 tablet 1  . folic acid (FOLVITE) 1 MG tablet Take 1 tablet (1 mg total) by mouth daily. (Patient not taking: Reported on 02/21/2018) 90 tablet 0  . meloxicam (MOBIC) 15 MG tablet Take 1 tablet (15 mg total) by mouth daily. TAKE WITH MEALS (Patient not taking: Reported on 02/21/2018) 30 tablet 2  . potassium chloride 20 MEQ TBCR Take 20 mEq by mouth daily for 3 days. (Patient not taking: Reported on 11/23/2018) 3 tablet 0  . rosuvastatin  (CRESTOR) 10 MG tablet Take 1 tablet (10 mg total) by mouth at bedtime. (Patient not taking: Reported on 11/22/2018) 30 tablet 1  . thiamine 100 MG tablet Take 1 tablet (100 mg total) by mouth daily. (Patient not taking: Reported on 02/21/2018) 30 tablet 0    Objective: BP 129/87   Pulse 83   Temp 98.7 F (37.1 C) (Oral)   Resp 18   Ht 5\' 11"  (1.803 m)   Wt 85.7 kg   SpO2 98%   BMI 26.36 kg/m    Physical exam: General: Mildly diaphoretic, NAD, pleasant, able to participate in exam Cardiac: RRR, normal heart sounds,  no murmurs. 2+ radial and PT pulses bilaterally Respiratory: CTAB, normal effort, No wheezes, rales or rhonchi Abdomen: Left upper quadrant tenderness on palpation, mildly distended, no hepatic or splenomegaly, +BS Extremities: no edema or cyanosis. WWP. Skin: warm and dry, no rashes noted Neuro: alert and oriented x4, no focal deficits Psych: Normal affect and mood   Labs and Imaging: CBC BMET  Recent Labs  Lab 11/23/18 1715  WBC 8.3  HGB 13.0  HCT 36.1*  PLT 126*   Recent Labs  Lab 11/23/18 1715  NA 131*  K 3.4*  CL 95*  CO2 21*  BUN 11  CREATININE 0.82  GLUCOSE 128*  CALCIUM 9.2     Lipase 188 AST 192 ALT 130 T bili 1.3  Ct Abdomen Pelvis W Contrast Impression: Acute pancreatitis. Ill-defined fluid/edema about the pancreatic body and pancreatic tail. No evidence of pancreatic necrosis. No pseudocyst formation seen. 2. Fatty infiltration of the liver. Aortic Atherosclerosis (ICD10-I70.0). Electronically Signed   By: Franki Cabot M.D.   On: 11/23/2018 18:25     Marjie Skiff, MD 11/23/2018, 9:36 PM PGY-3, [place] Intern pager: [phone], text pages welcome

## 2018-11-23 NOTE — ED Notes (Signed)
Please call Armari Fussell (Daughter) at [phone] to give her an update on patient.

## 2018-11-24 ENCOUNTER — Inpatient Hospital Stay (HOSPITAL_COMMUNITY): Payer: Self-pay

## 2018-11-24 ENCOUNTER — Encounter (HOSPITAL_COMMUNITY): Payer: Self-pay | Admitting: Family Medicine

## 2018-11-24 DIAGNOSIS — D696 Thrombocytopenia, unspecified: Secondary | ICD-10-CM

## 2018-11-24 DIAGNOSIS — K852 Alcohol induced acute pancreatitis without necrosis or infection: Principal | ICD-10-CM

## 2018-11-24 DIAGNOSIS — E871 Hypo-osmolality and hyponatremia: Secondary | ICD-10-CM

## 2018-11-24 DIAGNOSIS — E785 Hyperlipidemia, unspecified: Secondary | ICD-10-CM

## 2018-11-24 DIAGNOSIS — I10 Essential (primary) hypertension: Secondary | ICD-10-CM

## 2018-11-24 DIAGNOSIS — R740 Nonspecific elevation of levels of transaminase and lactic acid dehydrogenase [LDH]: Secondary | ICD-10-CM

## 2018-11-24 LAB — CBC
HCT: 32.2 % — ABNORMAL LOW (ref 39.0–52.0)
Hemoglobin: 11.1 g/dL — ABNORMAL LOW (ref 13.0–17.0)
MCH: 33.1 pg (ref 26.0–34.0)
MCHC: 34.5 g/dL (ref 30.0–36.0)
MCV: 96.1 fL (ref 80.0–100.0)
Platelets: 135 10*3/uL — ABNORMAL LOW (ref 150–400)
RBC: 3.35 MIL/uL — ABNORMAL LOW (ref 4.22–5.81)
RDW: 13 % (ref 11.5–15.5)
WBC: 7.5 10*3/uL (ref 4.0–10.5)
nRBC: 0 % (ref 0.0–0.2)

## 2018-11-24 LAB — COMPREHENSIVE METABOLIC PANEL
ALT: 98 U/L — ABNORMAL HIGH (ref 0–44)
AST: 132 U/L — ABNORMAL HIGH (ref 15–41)
Albumin: 3 g/dL — ABNORMAL LOW (ref 3.5–5.0)
Alkaline Phosphatase: 41 U/L (ref 38–126)
Anion gap: 13 (ref 5–15)
BUN: 8 mg/dL (ref 6–20)
CO2: 23 mmol/L (ref 22–32)
Calcium: 8.8 mg/dL — ABNORMAL LOW (ref 8.9–10.3)
Chloride: 96 mmol/L — ABNORMAL LOW (ref 98–111)
Creatinine, Ser: 0.74 mg/dL (ref 0.61–1.24)
GFR calc Af Amer: >60 mL/min (ref >60)
GFR calc non Af Amer: >60 mL/min (ref >60)
Glucose, Bld: 98 mg/dL (ref 70–99)
Potassium: 3.7 mmol/L (ref 3.5–5.1)
Sodium: 132 mmol/L — ABNORMAL LOW (ref 135–145)
Total Bilirubin: 1.3 mg/dL — ABNORMAL HIGH (ref 0.3–1.2)
Total Protein: 7.2 g/dL (ref 6.5–8.1)

## 2018-11-24 LAB — LIPID PANEL
Cholesterol: 289 mg/dL — ABNORMAL HIGH (ref 0–200)
HDL: 37 mg/dL — ABNORMAL LOW (ref >40)
LDL Cholesterol: 188 mg/dL — ABNORMAL HIGH (ref 0–99)
Total CHOL/HDL Ratio: 7.8 RATIO
Triglycerides: 319 mg/dL — ABNORMAL HIGH (ref <150)
VLDL: 64 mg/dL — ABNORMAL HIGH (ref 0–40)

## 2018-11-24 LAB — TSH: TSH: 3.926 u[IU]/mL (ref 0.350–4.500)

## 2018-11-24 LAB — HEMOGLOBIN A1C
Hgb A1c MFr Bld: 6.1 % — ABNORMAL HIGH (ref 4.8–5.6)
Mean Plasma Glucose: 128.37 mg/dL

## 2018-11-24 LAB — MAGNESIUM: Magnesium: 2.1 mg/dL (ref 1.7–2.4)

## 2018-11-24 LAB — MRSA PCR SCREENING: MRSA by PCR: NEGATIVE

## 2018-11-24 LAB — PHOSPHORUS: Phosphorus: 2.3 mg/dL — ABNORMAL LOW (ref 2.5–4.6)

## 2018-11-24 MED ORDER — ROSUVASTATIN CALCIUM 20 MG PO TABS: 20.0000 mg | ORAL_TABLET | Freq: Every day | ORAL | 0 refills | Status: DC

## 2018-11-24 MED ORDER — ROSUVASTATIN CALCIUM 20 MG PO TABS
20.0000 mg | ORAL_TABLET | Freq: Every day | ORAL | Status: DC
  Administered 2018-11-24: 20 mg via ORAL
  Filled 2018-11-24: qty 1

## 2018-11-24 MED ORDER — LACTATED RINGERS IV SOLN
INTRAVENOUS | Status: DC
  Administered 2018-11-24: 13:00:00 via INTRAVENOUS

## 2018-11-24 MED ORDER — OXYCODONE HCL 5 MG PO TABS
5.0000 mg | ORAL_TABLET | ORAL | Status: DC | PRN
  Administered 2018-11-24: 5 mg via ORAL
  Filled 2018-11-24: qty 1

## 2018-11-24 NOTE — Discharge Summary (Signed)
Stephen Bowers Discharge Summary  Patient name: Stephen Bowers Medical record number: [id number] Date of birth: 06-14-1961 Age: 58 y.o. Gender: male Date of Admission: 11/23/2018  Date of Discharge: 11/24/2018 Admitting Physician: Martyn Malay, MD  Primary Care Provider: Ladell Pier, MD Consultants: none  Indication for Hospitalization:  Acute pancreatitis  Discharge Diagnoses/Problem List:  Acute pancreatitis Upper quadrant pain Hypertension Transaminitis Hyponatremia Thrombocytopenia HLD Alcohol abuse Prediabetes  Disposition: Home  Discharge Condition: Stable  Discharge Exam:  General: NAD, pleasant, able to participate in exam Cardiac: RRR, normal heart sounds, no murmurs. 2+ radial and PT pulses bilaterally Respiratory: CTAB, normal effort, No wheezes, rales or rhonchi Abdomen: soft, LUQ and RUQ tenderness to palpation, nondistended, no hepatic or splenomegaly, +BS Extremities: no edema or cyanosis. WWP. Skin: warm and dry, no rashes noted Neuro: alert and oriented x4, no focal deficits Psych: Normal affect and mood  Exam per Dr. Carmelina Dane a.m. progress note  Brief Bowers Course:  58 year old African-American male who presented on 11/23/2018 with 3-day history of left-sided abdominal pain radiating to his back.  On admission lipase noted to be 188 and CT scan of abdomen showing fluid and edema around the pancreatic body and tail  Acute pancreatitis Patient started on clear liquid diet, initially received IV Dilaudid for 3 doses and then was transitioned to oxycodone 5 mg.  Patient able to tolerate lunch with no issue and but the afternoon of 5/24 he was feeling great.  Patient does report drinking 3-4 beers per day and brandy "3-4 times per week".  Patient also history of hyperlipidemia.  Lipid panel from June showed triglycerides in the thousand range.  Repeat cholesterol panel at admission shows LDL 188, TG 319, VLDL 64.  Right  upper quadrant ultrasound was performed which showed no biliary stones or sludge.  Patient was restarted on Crestor 20 mg/day discharge.  He is to follow-up with his PCP to discuss if he needs restart his Zetia.  He is able to walk and tolerate dinner with no issues prior to leaving.  Hypertension BP ranging in the 132G systolic range.  Continued on home amlodipine and carvedilol.  Can follow-up with PCP for further management  Transaminitis AST 182, ALT 133 on admission.  Evidence of fatty liver on CT, and hepatic steatosis on right upper quadrant ultrasound.  Likely secondary to chronic alcohol abuse.  Counseled patient on alcohol cessation.  Thrombocytopenia Platelet 126 on admission.  Likely secondary to fatty liver disease secondary to alcohol abuse.  Alcohol abuse Per patient report he drinks 3 beers per day and brandy 3-4 times per week.  Gave alcohol cessation counseling.  Issues for Follow Up:  1. Consider restarting Zetia on top of Crestor given elevated LDL and cholesterol and TG 2. Follow-up blood pressure, consider increasing dose of current medications if still high 3. To follow-up PCP for continued alcohol cessation measures  Significant Procedures:   Significant Labs and Imaging:  Recent Labs  Lab 11/23/18 1715 11/24/18 0606  WBC 8.3 7.5  HGB 13.0 11.1*  HCT 36.1* 32.2*  PLT 126* 135*   Recent Labs  Lab 11/23/18 1715 11/24/18 0606  NA 131* 132*  K 3.4* 3.7  CL 95* 96*  CO2 21* 23  GLUCOSE 128* 98  BUN 11 8  CREATININE 0.82 0.74  CALCIUM 9.2 8.8*  MG  --  2.1  PHOS  --  2.3*  ALKPHOS 51 41  AST 192* 132*  ALT 130* 98*  ALBUMIN 3.5  3.0*    Results/Tests Pending at Time of Discharge:  Discharge Medications:  Allergies as of 11/24/2018   No Known Allergies     Medication List    STOP taking these medications   diphenhydramine-acetaminophen 25-500 MG Tabs tablet Commonly known as:  TYLENOL PM   fenofibrate 160 MG tablet   ferrous sulfate 325  (65 FE) MG tablet   folic acid 1 MG tablet Commonly known as:  FOLVITE   meloxicam 15 MG tablet Commonly known as:  MOBIC   Potassium Chloride ER 20 MEQ Tbcr     TAKE these medications   acetaminophen 500 MG tablet Commonly known as:  TYLENOL Take 1,000 mg by mouth daily.   acetaminophen 325 MG tablet Commonly known as:  Tylenol Take 2 tablets (650 mg total) by mouth every 6 (six) hours as needed. Do not take more than 4000mg  of tylenol per day   amLODipine 10 MG tablet Commonly known as:  NORVASC Take 10 mg by mouth daily.   carvedilol 12.5 MG tablet Commonly known as:  COREG Take 1 tablet (12.5 mg total) by mouth 2 (two) times daily with a meal.   cholecalciferol 1000 units tablet Commonly known as:  VITAMIN D Take 1 tablet (1,000 Units total) by mouth daily. What changed:  when to take this   multivitamin with minerals Tabs tablet Take 1 tablet by mouth daily.   rosuvastatin 20 MG tablet Commonly known as:  CRESTOR Take 1 tablet (20 mg total) by mouth daily at 6 PM. Start taking on:  Nov 25, 2018 What changed:    medication strength  how much to take  when to take this   thiamine 100 MG tablet Take 1 tablet (100 mg total) by mouth daily.   VISINE OP Place 1 drop into both eyes daily.       Discharge Instructions: Please refer to Patient Instructions section of EMR for full details.  Patient was counseled important signs and symptoms that should prompt return to medical care, changes in medications, dietary instructions, activity restrictions, and follow up appointments.   Follow-Up Appointments:   Guadalupe Dawn, MD 11/24/2018, 4:17 PM PGY-2, [place]

## 2018-11-24 NOTE — Progress Notes (Addendum)
Family Medicine Teaching Service Daily Progress Note Intern Pager: [phone]  Patient name: Stephen Bowers Medical record number: [id number] Date of birth: 1961/05/08 Age: 58 y.o. Gender: male  Primary Care Provider: Ladell Pier, MD Consultants: None Code Status: Full  Pt Overview and Major Events to Date:  Admitted 5/24  Assessment and Plan: Stephen Bowers is a 58 y.o. male replacement history significant for hypertension, hyperlipidemia, history of pancreatitis, alcohol abuse who presented today complaining of left-sided abdominal pain radiating to his back consistent with acute presentation of pancreatitis.  #Acute pancreatitis, improving Patient admitted for left-sided abdominal pain radiating to his back with elevated lipase and CT abdomen findings showing edema and inflammation of the pancreas consistent with pancreatitis.  Overnight pain has improved, and patient has been able to tolerate clear liquid diet.  Lipid panel showed cholesterol 289, LDL 188, Triglycerides 319. No nausea, vomiting reported overnight. --Transition patient to oxycodone 5 mg every 4 as needed --NPO see below  --Continue acetaminophen 650 mg every 6 as needed  --Continue Zofran 4 mg every 4 as needed --MiraLAX 17 g daily as needed  #RUQ, acute Paitient with RUQ tenderness this am on palpation. Given history of alcohol use pain could be secondary to possible cholelithiasis. CT has poor sensitivity for cholelithiasis, will order RUQ [place] to further characterize pain.  --F/u on RUQ pain --NPO, reassess in the afternoon  #Hypertension BP this morning is 129/99.  Will continue home regimen. --Amlodipine 10 mg nightly --Carvedilol 12.5 mg twice daily  #Transaminitis, improving This a.m. AST s 132, ALT 98.  T bili 1.3.  Improvement noted, likely multifactorial from alcoholic and fatty liver disease.   --Follow up on am CMP  #Electrolyte abnormalities This a.m. Na+132,K+3.7.  Suspect poor p.o. and  fluid dilution as reason for abnormalities on initial presentation.  Should continue to improve with increased p.o. intake and repletion. --Follow-up on a.m. CMP  #Thrombocytopenia, mild, improving This a.m. platelets 135.  Likely secondary to alcohol abuse, liver disease and acute illness.  We will continue to monitor.  No signs of bleeding --Follow-up on a.m. CBC  #Hyperlipidemia Lipid panel showed cholesterol 289, LDL 188, HDL 37, Triglycerides 319.  Patient previously on statin therapy which was discontinued by PCP per patient.  Given recurrent bout of pancreatitis and elevated triglyceride patient will need to resume statin therapy. --Restart Crestor 40 mg daily --Discuss restarting fenofibrate with PCP  #Alcohol abuse CIWA score overnight 8.   We will continue to monitor --Continue CIWA scoring --Ativan prn --Counseling prior to discharge  #Prediabetes A1c 6.1. Will likely need to be started on metformin in the outpatient setting in addition to therapeutic lifestyle changes.  FEN/GI: Full liquid advance to soft PPx: Lovenox  Disposition: Improving, likely home on 5/25  Subjective:  Pain has improved overnight. Patient able to tolerate clear liquid diet and requesting solids. No fever, palpitations, diaphoresis overnight. Patient is ambulating.  Objective: Temp:  [98.7 F (37.1 C)] 98.7 F (37.1 C) (05/23 2154) Pulse Rate:  [83-116] 89 (05/23 2154) Resp:  [18-19] 19 (05/23 2154) BP: (107-160)/(76-109) 130/102 (05/23 2154) SpO2:  [94 %-100 %] 100 % (05/23 2154) Weight:  [85.7 kg] 85.7 kg (05/23 1615)   Physical Exam: General: NAD, pleasant, able to participate in exam Cardiac: RRR, normal heart sounds, no murmurs. 2+ radial and PT pulses bilaterally Respiratory: CTAB, normal effort, No wheezes, rales or rhonchi Abdomen: soft, LUQ and RUQ tenderness to palpation, nondistended, no hepatic or splenomegaly, +BS Extremities: no edema  or cyanosis. WWP. Skin: warm and dry,  no rashes noted Neuro: alert and oriented x4, no focal deficits Psych: Normal affect and mood   Laboratory: Recent Labs  Lab 11/23/18 1715  WBC 8.3  HGB 13.0  HCT 36.1*  PLT 126*   Recent Labs  Lab 11/23/18 1715  NA 131*  K 3.4*  CL 95*  CO2 21*  BUN 11  CREATININE 0.82  CALCIUM 9.2  PROT 8.5*  BILITOT 1.3*  ALKPHOS 51  ALT 130*  AST 192*  GLUCOSE 128*     Imaging/Diagnostic Tests: Ct Abdomen Pelvis W Contrast IMPRESSION: 1. Acute pancreatitis. Ill-defined fluid/edema about the pancreatic body and pancreatic tail. No evidence of pancreatic necrosis. No pseudocyst formation seen. 2. Fatty infiltration of the liver. Aortic Atherosclerosis (ICD10-I70.0). Electronically Signed   By: Franki Cabot M.D.   On: 11/23/2018 18:25    Marjie Skiff, MD 11/24/2018, 12:05 AM PGY-3, [place] Intern pager: [phone], text pages welcome

## 2018-11-24 NOTE — Progress Notes (Signed)
DISCHARGE NOTE HOME Stephen Bowers to be discharged Home per MD order. Discussed prescriptions and follow up appointments with the patient. Prescriptions given to patient; medication list explained in detail. Patient verbalized understanding.  Skin clean, dry and intact without evidence of skin break down, no evidence of skin tears noted. IV catheter discontinued intact. Site without signs and symptoms of complications. Dressing and pressure applied. Pt denies pain at the site currently. No complaints noted.  Patient free of lines, drains, and wounds.   An After Visit Summary (AVS) was printed and given to the patient. Patient escorted via wheelchair, and discharged home via private auto.  Arlyss Repress, RN

## 2018-11-25 LAB — URINE CULTURE: Culture: NO GROWTH

## 2018-11-26 ENCOUNTER — Other Ambulatory Visit: Payer: Self-pay | Admitting: Internal Medicine

## 2018-11-26 MED FILL — ROSUVASTATIN CALCIUM 20 MG: 20 | 30 days supply | Qty: 30 | Fill #0

## 2018-12-03 MED FILL — ?AMLODIPINE BESYLATE 10 MG: 10 | 30 days supply | Qty: 30 | Fill #0

## 2018-12-16 ENCOUNTER — Telehealth: Payer: Self-pay | Admitting: Internal Medicine

## 2018-12-16 NOTE — Telephone Encounter (Signed)
Called to make the hospital fu and lvm for the pt to call back set for 2 weeks

## 2018-12-16 NOTE — Telephone Encounter (Signed)
-----   Message from Ladell Pier, MD sent at 11/27/2018 11:11 AM EDT ----- Regarding: needs hosp f/u in person within the next 1-2 wks

## 2018-12-24 ENCOUNTER — Inpatient Hospital Stay: Payer: Self-pay | Admitting: Internal Medicine

## 2019-01-01 ENCOUNTER — Other Ambulatory Visit: Payer: Self-pay

## 2019-01-01 DIAGNOSIS — I10 Essential (primary) hypertension: Secondary | ICD-10-CM

## 2019-01-01 MED ORDER — ROSUVASTATIN CALCIUM 20 MG PO TABS: 20.0000 mg | ORAL_TABLET | Freq: Every day | ORAL | 0 refills | Status: DC

## 2019-01-01 MED ORDER — CARVEDILOL 12.5 MG PO TABS: 12.5000 mg | ORAL_TABLET | Freq: Two times a day (BID) | ORAL | 0 refills | Status: DC

## 2019-01-01 MED FILL — ?AMLODIPINE BESYLATE 10 MG: 10 | 30 days supply | Qty: 30 | Fill #1

## 2019-01-01 MED FILL — CARVEDILOL 12.5 MG TABLET: 12.5 | 30 days supply | Qty: 60 | Fill #0

## 2019-01-01 MED FILL — ?ROSUVASTATIN CALCIUM 20MG: 20 | 30 days supply | Qty: 30 | Fill #0

## 2019-01-01 NOTE — Telephone Encounter (Signed)
Patient's caregiver contacted the pharmacy for refills on the following medications, she stated that the patient was in the hospital and set to be discharged soon. He does not seem to be at a [facility] facility and I cannot seem to access his CareEverywhere to see where he is admitted. If appropriate please authorize the refills requested.

## 2019-01-16 ENCOUNTER — Inpatient Hospital Stay: Payer: Self-pay | Admitting: Internal Medicine

## 2019-02-24 ENCOUNTER — Encounter: Payer: Self-pay | Admitting: Internal Medicine

## 2019-02-24 ENCOUNTER — Other Ambulatory Visit: Payer: Self-pay

## 2019-02-24 ENCOUNTER — Ambulatory Visit: Payer: Self-pay | Attending: Internal Medicine | Admitting: Internal Medicine

## 2019-02-24 VITALS — BP 105/74 | HR 80 | Temp 98.3°F | Resp 16 | Wt 174.6 lb

## 2019-02-24 DIAGNOSIS — R7303 Prediabetes: Secondary | ICD-10-CM | POA: Insufficient documentation

## 2019-02-24 DIAGNOSIS — D649 Anemia, unspecified: Secondary | ICD-10-CM | POA: Insufficient documentation

## 2019-02-24 DIAGNOSIS — K700 Alcoholic fatty liver: Secondary | ICD-10-CM

## 2019-02-24 DIAGNOSIS — E782 Mixed hyperlipidemia: Secondary | ICD-10-CM

## 2019-02-24 DIAGNOSIS — I10 Essential (primary) hypertension: Secondary | ICD-10-CM

## 2019-02-24 DIAGNOSIS — Z23 Encounter for immunization: Secondary | ICD-10-CM

## 2019-02-24 DIAGNOSIS — R5383 Other fatigue: Secondary | ICD-10-CM

## 2019-02-24 DIAGNOSIS — K760 Fatty (change of) liver, not elsewhere classified: Secondary | ICD-10-CM | POA: Insufficient documentation

## 2019-02-24 DIAGNOSIS — F1010 Alcohol abuse, uncomplicated: Secondary | ICD-10-CM

## 2019-02-24 DIAGNOSIS — E559 Vitamin D deficiency, unspecified: Secondary | ICD-10-CM

## 2019-02-24 MED ORDER — CARVEDILOL 12.5 MG PO TABS: 12.5000 mg | ORAL_TABLET | Freq: Two times a day (BID) | ORAL | 6 refills | Status: DC

## 2019-02-24 MED ORDER — AMLODIPINE BESYLATE 10 MG PO TABS: ORAL_TABLET | ORAL | 6 refills | Status: DC

## 2019-02-24 MED ORDER — ROSUVASTATIN CALCIUM 20 MG PO TABS: 20.0000 mg | ORAL_TABLET | Freq: Every day | ORAL | 6 refills | Status: DC

## 2019-02-24 MED FILL — ?CARVEDILOL 12.5 MG TABLET: 12.5 | 30 days supply | Qty: 60 | Fill #0

## 2019-02-24 MED FILL — ?AMLODIPINE BESYLATE 10 MG: 10 | 30 days supply | Qty: 30 | Fill #2

## 2019-02-24 MED FILL — ?ROSUVASTATIN CALCIUM 20MG: 20 | 30 days supply | Qty: 30 | Fill #0

## 2019-02-24 NOTE — Progress Notes (Signed)
Patient ID: TAJMIR GLOMB, male    DOB: 1960-11-27  MRN: [id number]  CC: Fatigue   Subjective: Stephen Bowers is a 58 y.o. male who presents for chronic ds management. His concerns today include:  HTN, vit Ddef, unspecified anemia , neuropathy in fingers, alcoholic pancreatitis, ETOH use disorder, HL, pre-DM  EtOH pancreatitis: Patient hospitalized 11/2018 with acute pancreatitis.  CT scan revealed acute pancreatitis with ill-defined fluid about the pancreatic body and pancreatic tail.  No evidence of pancreatic necrosis or pseudocyst was seen.  Did show fatty infiltration of the liver. - since hosp, he reports low energy; wonders about vit that may help.  He currently takes an over-the-counter One-A-Day men's vitamin. -sleeping 7-8 hrs at nights.  Gets in bed about 8-9 p.m, gets up at 5 a.m for work.  Does not snore and wakes feeling refresh.  Works 12-13 hrs a day.  He feels working the long hours contributes to his feeling of fatigue.  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  He does a lot of lifting and unloading -cut back on ETOH consumption.  Down to 2 shots and 2 beers 2 days on the wkend.  Does not drink during the week because he has to go to work.  He plans to quit completely -former smoker.  Quit 30 yrs ago  HYPERTENSION Currently taking: see medication list Med Adherence: [x]  Yes -with carvedilol and amlodipine Medication side effects: []  Yes    [x]  No Adherence with salt restriction: [x]  Yes    []  No Home Monitoring?: [x]  Yes    []  No Monitoring Frequency:  Wife checks daily Home BP results range: reports # have been good SOB? [x]  Yes -a times Chest Pain?: []  Yes    [x]  No Leg swelling?: []  Yes    [x]  No Headaches?: []  Yes    [x]  No Dizziness? []  Yes    [x]  No Comments:  No PND or ortthopnea  HL/TG:  Reports he is taking the Crestor.  Not sure if he is out of it.  Wife fills med box.  Last RF sent 01/01/2019 without RF so he should be out of it.  Patient Active  Problem List   Diagnosis Date Noted  . Acute pancreatitis 12/20/2017  . Increased anion gap metabolic acidosis   . Severe sepsis ([place])   . Abnormal LFTs 12/04/2017  . Pancreatitis 12/04/2017  . Hyperlipidemia 10/16/2016  . AKI (acute kidney injury) ([place])   . HTN (hypertension) 03/02/2015  . Vitamin D deficiency 01/19/2015  . Low back pain potentially associated with radiculopathy 03/30/2014     Current Outpatient Medications on File Prior to Visit  Medication Sig Dispense Refill  . acetaminophen (TYLENOL) 325 MG tablet Take 2 tablets (650 mg total) by mouth every 6 (six) hours as needed. Do not take more than 4000mg  of tylenol per day (Patient not taking: Reported on 11/23/2018) 30 tablet 0  . acetaminophen (TYLENOL) 500 MG tablet Take 1,000 mg by mouth daily.    [name] amLODipine (NORVASC) 10 MG tablet TAKE 1 TABLET (10 MG TOTAL) BY MOUTH DAILY. 30 tablet 3  . carvedilol (COREG) 12.5 MG tablet Take 1 tablet (12.5 mg total) by mouth 2 (two) times daily with a meal. 60 tablet 0  . cholecalciferol (VITAMIN D) 1000 units tablet Take 1 tablet (1,000 Units total) by mouth daily. (Patient taking differently: Take 1,000 Units by mouth 2 (two) times a week. ) 90 tablet 1  . Multiple Vitamin (MULTIVITAMIN WITH MINERALS)  TABS tablet Take 1 tablet by mouth daily.    . rosuvastatin (CRESTOR) 20 MG tablet Take 1 tablet (20 mg total) by mouth daily at 6 PM. 30 tablet 0  . Tetrahydrozoline HCl (VISINE OP) Place 1 drop into both eyes daily.    [name] thiamine 100 MG tablet Take 1 tablet (100 mg total) by mouth daily. (Patient not taking: Reported on 02/21/2018) 30 tablet 0   No current facility-administered medications on file prior to visit.     No Known Allergies  Social History   Socioeconomic History  . Marital status: Married    Spouse name: Malachy Mood  . Number of children: 1   . Years of education: 33   . Highest education level: Not on file  Occupational History  . Occupation: Unemployed   Social  Needs  . Financial resource strain: Not on file  . Food insecurity    Worry: Not on file    Inability: Not on file  . Transportation needs    Medical: Not on file    Non-medical: Not on file  Tobacco Use  . Smoking status: Former Smoker    Packs/day: 0.25    Years: 6.00    Pack years: 1.50    Types: Cigarettes    Quit date: 1995    Years since quitting: 25.6  . Smokeless tobacco: Never Used  Substance and Sexual Activity  . Alcohol use: Yes    Alcohol/week: 6.0 standard drinks    Types: 6 Cans of beer per week    Comment: 6 drinks Beer /Liquor  . Drug use: No  . Sexual activity: Yes  Lifestyle  . Physical activity    Days per week: Not on file    Minutes per session: Not on file  . Stress: Not on file  Relationships  . Social Herbalist on phone: Not on file    Gets together: Not on file    Attends religious service: Not on file    Active member of club or organization: Not on file    Attends meetings of clubs or organizations: Not on file    Relationship status: Not on file  . Intimate partner violence    Fear of current or ex partner: Not on file    Emotionally abused: Not on file    Physically abused: Not on file    Forced sexual activity: Not on file  Other Topics Concern  . Not on file  Social History Narrative   Lives in an [place], family house.    Child goes to [employer] in [place], 24 yo F,           Family History  Problem Relation Age of Onset  . Liver disease Father   . Alcohol abuse Father   . Diabetes Sister   . Obesity Sister   . Cancer Mother        breast   . Kidney disease Mother   . Colon cancer Neg Hx   . Esophageal cancer Neg Hx   . Rectal cancer Neg Hx   . Stomach cancer Neg Hx   . Colon polyps Neg Hx     Past Surgical History:  Procedure Laterality Date  . COLONOSCOPY  12/2014   hx polyps - Pyrtle  . MULTIPLE TOOTH EXTRACTIONS      ROS: Review of Systems Negative except as stated above  PHYSICAL EXAM: BP  105/74   Pulse 80   Temp 98.3 F (36.8 C) (Oral)  Resp 16   Wt 174 lb 9.6 oz (79.2 kg)   SpO2 98%   BMI 24.35 kg/m   Wt Readings from Last 3 Encounters:  02/24/19 174 lb 9.6 oz (79.2 kg)  11/23/18 189 lb 6 oz (85.9 kg)  11/22/18 189 lb 12.8 oz (86.1 kg)   Physical Exam  General appearance - alert, well appearing, middle-aged African-American male and in no distress Mental status - normal mood, behavior, speech, dress, motor activity, and thought processes Eyes - pupils equal and reactive, extraocular eye movements intact Mouth - mucous membranes moist, pharynx normal without lesions Neck - supple, no significant adenopathy Chest - clear to auscultation, no wheezes, rales or rhonchi, symmetric air entry Heart - normal rate, regular rhythm, normal S1, S2, no murmurs, rubs, clicks or gallops Abdomen: nl bowel sounds, non-distended, no hepatomegaly Extremities - peripheral pulses normal, no pedal edema, no clubbing or cyanosis  Lab Results  Component Value Date   HGBA1C 6.1 (H) 11/23/2018    CMP Latest Ref Rng & Units 11/24/2018 11/23/2018 12/24/2017  Glucose 70 - 99 mg/dL 98 128(H) -  BUN 6 - 20 mg/dL 8 11 -  Creatinine 0.61 - 1.24 mg/dL 0.74 0.82 -  Sodium 135 - 145 mmol/L 132(L) 131(L) -  Potassium 3.5 - 5.1 mmol/L 3.7 3.4(L) 3.9  Chloride 98 - 111 mmol/L 96(L) 95(L) -  CO2 22 - 32 mmol/L 23 21(L) -  Calcium 8.9 - 10.3 mg/dL 8.8(L) 9.2 -  Total Protein 6.5 - 8.1 g/dL 7.2 8.5(H) -  Total Bilirubin 0.3 - 1.2 mg/dL 1.3(H) 1.3(H) -  Alkaline Phos 38 - 126 U/L 41 51 -  AST 15 - 41 U/L 132(H) 192(H) -  ALT 0 - 44 U/L 98(H) 130(H) -   Lipid Panel     Component Value Date/Time   CHOL 289 (H) 11/23/2018 2230   TRIG 319 (H) 11/23/2018 2230   HDL 37 (L) 11/23/2018 2230   CHOLHDL 7.8 11/23/2018 2230   VLDL 64 (H) 11/23/2018 2230   LDLCALC 188 (H) 11/23/2018 2230    CBC    Component Value Date/Time   WBC 7.5 11/24/2018 0606   RBC 3.35 (L) 11/24/2018 0606   HGB 11.1 (L)  11/24/2018 0606   HGB 12.8 (L) 06/08/2017 1601   HCT 32.2 (L) 11/24/2018 0606   HCT 38.7 12/21/2017 1830   PLT 135 (L) 11/24/2018 0606   PLT 167 06/08/2017 1601   MCV 96.1 11/24/2018 0606   MCV 95 06/08/2017 1601   MCH 33.1 11/24/2018 0606   MCHC 34.5 11/24/2018 0606   RDW 13.0 11/24/2018 0606   RDW 13.9 06/08/2017 1601   LYMPHSABS 1.1 11/23/2018 1715   MONOABS 1.1 (H) 11/23/2018 1715   EOSABS 0.1 11/23/2018 1715   BASOSABS 0.0 11/23/2018 1715    ASSESSMENT AND PLAN: 1. Essential hypertension At goal Cont current meds and DASH diet - carvedilol (COREG) 12.5 MG tablet; Take 1 tablet (12.5 mg total) by mouth 2 (two) times daily with a meal.  Dispense: 60 tablet; Refill: 6 - amLODipine (NORVASC) 10 MG tablet; TAKE 1 TABLET (10 MG TOTAL) BY MOUTH DAILY.  Dispense: 30 tablet; Refill: 6 - Comprehensive metabolic panel  2. Alcohol use disorder, mild, abuse 3. Alcoholic fatty liver -commended him on cutting back.  Encouraged him to quit completely to prevent further injury to liver and pancreas   4. Prediabetes Discussed dx and importance of healthy eating habits and regular exercise.  Dietary counseling given.  Printed info given  5. Mixed hyperlipidemia - rosuvastatin (CRESTOR) 20 MG tablet; Take 1 tablet (20 mg total) by mouth daily at 6 PM.  Dispense: 30 tablet; Refill: 6  6. Fatigue, unspecified type - TSH - Testosterone  7. Normocytic anemia - CBC - Iron, TIBC and Ferritin Panel - Vitamin B12 - Folate  8. Need for influenza vaccination given  9. Vitamin D deficiency - VITAMIN D 25 Hydroxy (Vit-D Deficiency, Fractures)   Patient was given the opportunity to ask questions.  Patient verbalized understanding of the plan and was able to repeat key elements of the plan.   No orders of the defined types were placed in this encounter.    Requested Prescriptions    No prescriptions requested or ordered in this encounter    No follow-ups on file.  Karle Plumber, MD, FACP

## 2019-02-24 NOTE — Patient Instructions (Addendum)
Prediabetes Prediabetes is the condition of having a blood sugar (blood glucose) level that is higher than it should be, but not high enough for you to be diagnosed with type 2 diabetes. Having prediabetes puts you at risk for developing type 2 diabetes (type 2 diabetes mellitus). Prediabetes may be called impaired glucose tolerance or impaired fasting glucose. Prediabetes usually does not cause symptoms. Your health care provider can diagnose this condition with blood tests. You may be tested for prediabetes if you are overweight and if you have at least one other risk factor for prediabetes. What is blood glucose, and how is it measured? Blood glucose refers to the amount of glucose in your bloodstream. Glucose comes from eating foods that contain sugars and starches (carbohydrates), which the body breaks down into glucose. Your blood glucose level may be measured in mg/dL (milligrams per deciliter) or mmol/L (millimoles per liter). Your blood glucose may be checked with one or more of the following blood tests:  A fasting blood glucose (FBG) test. You will not be allowed to eat (you will fast) for 8 hours or longer before a blood sample is taken. ? A normal range for FBG is 70-100 mg/dl (3.9-5.6 mmol/L).  An A1c (hemoglobin A1c) blood test. This test provides information about blood glucose control over the previous 2?3months.  An oral glucose tolerance test (OGTT). This test measures your blood glucose at two times: ? After fasting. This is your baseline level. ? Two hours after you drink a beverage that contains glucose. You may be diagnosed with prediabetes:  If your FBG is 100?125 mg/dL (5.6-6.9 mmol/L).  If your A1c level is 5.7?6.4%.  If your OGTT result is 140?199 mg/dL (7.8-11 mmol/L). These blood tests may be repeated to confirm your diagnosis. How can this condition affect me? The pancreas produces a hormone (insulin) that helps to move glucose from the bloodstream into cells.  When cells in the body do not respond properly to insulin that the body makes (insulin resistance), excess glucose builds up in the blood instead of going into cells. As a result, high blood glucose (hyperglycemia) can develop, which can cause many complications. Hyperglycemia is a symptom of prediabetes. Having high blood glucose for a long time is dangerous. Too much glucose in your blood can damage your nerves and blood vessels. Long-term damage can lead to complications from diabetes, which may include:  Heart disease.  Stroke.  Blindness.  Kidney disease.  Depression.  Poor circulation in the feet and legs, which could lead to surgical removal (amputation) in severe cases. What can increase my risk? Risk factors for prediabetes include:  Having a family member with type 2 diabetes.  Being overweight or obese.  Being older than age 45.  Being of American Indian, African-American, Hispanic/Latino, or Asian/Pacific Islander descent.  Having an inactive (sedentary) lifestyle.  Having a history of heart disease.  History of gestational diabetes or polycystic ovary syndrome (PCOS), in women.  Having low levels of good cholesterol (HDL-C) or high levels of blood fats (triglycerides).  Having high blood pressure. What actions can I take to prevent diabetes?      Be physically active. ? Do moderate-intensity physical activity for 30 or more minutes on 5 or more days of the week, or as much as told by your health care provider. This could be brisk walking, biking, or water aerobics. ? Ask your health care provider what activities are safe for you. A mix of physical activities may be best, such as   walking, swimming, cycling, and strength training.  Lose weight as told by your health care provider. ? Losing 5-7% of your body weight can reverse insulin resistance. ? Your health care provider can determine how much weight loss is best for you and can help you lose weight  safely.  Follow a healthy meal plan. This includes eating lean proteins, complex carbohydrates, fresh fruits and vegetables, low-fat dairy products, and healthy fats. ? Follow instructions from your health care provider about eating or drinking restrictions. ? Make an appointment to see a diet and nutrition specialist (registered dietitian) to help you create a healthy eating plan that is right for you.  Do not smoke or use any tobacco products, such as cigarettes, chewing tobacco, and e-cigarettes. If you need help quitting, ask your health care provider.  Take over-the-counter and prescription medicines as told by your health care provider. You may be prescribed medicines that help lower the risk of type 2 diabetes.  Keep all follow-up visits as told by your health care provider. This is important. Summary  Prediabetes is the condition of having a blood sugar (blood glucose) level that is higher than it should be, but not high enough for you to be diagnosed with type 2 diabetes.  Having prediabetes puts you at risk for developing type 2 diabetes (type 2 diabetes mellitus).  To help prevent type 2 diabetes, make lifestyle changes such as being physically active and eating a healthy diet. Lose weight as told by your health care provider. This information is not intended to replace advice given to you by your health care provider. Make sure you discuss any questions you have with your health care provider. Document Released: 10/11/2015 Document Revised: 10/11/2018 Document Reviewed: 08/10/2015 Elsevier Patient Education  [place].   Influenza Virus Vaccine injection (Fluarix) What is this medicine? INFLUENZA VIRUS VACCINE (in floo EN zuh VAHY ruhs vak SEEN) helps to reduce the risk of getting influenza also known as the flu. This medicine may be used for other purposes; ask your health care provider or pharmacist if you have questions. COMMON BRAND NAME(S): Fluarix, Fluzone What  should I tell my health care provider before I take this medicine? They need to know if you have any of these conditions:  bleeding disorder like hemophilia  fever or infection  Guillain-Barre syndrome or other neurological problems  immune system problems  infection with the human immunodeficiency virus (HIV) or AIDS  low blood platelet counts  multiple sclerosis  an unusual or allergic reaction to influenza virus vaccine, eggs, chicken proteins, latex, gentamicin, other medicines, foods, dyes or preservatives  pregnant or trying to get pregnant  breast-feeding How should I use this medicine? This vaccine is for injection into a muscle. It is given by a health care professional. A copy of Vaccine Information Statements will be given before each vaccination. Read this sheet carefully each time. The sheet may change frequently. Talk to your pediatrician regarding the use of this medicine in children. Special care may be needed. Overdosage: If you think you have taken too much of this medicine contact a poison control center or emergency room at once. NOTE: This medicine is only for you. Do not share this medicine with others. What if I miss a dose? This does not apply. What may interact with this medicine?  chemotherapy or radiation therapy  medicines that lower your immune system like etanercept, anakinra, infliximab, and adalimumab  medicines that treat or prevent blood clots like warfarin  phenytoin  steroid medicines like prednisone or cortisone  theophylline  vaccines This list may not describe all possible interactions. Give your health care provider a list of all the medicines, herbs, non-prescription drugs, or dietary supplements you use. Also tell them if you smoke, drink alcohol, or use illegal drugs. Some items may interact with your medicine. What should I watch for while using this medicine? Report any side effects that do not go away within 3 days to your  doctor or health care professional. Call your health care provider if any unusual symptoms occur within 6 weeks of receiving this vaccine. You may still catch the flu, but the illness is not usually as bad. You cannot get the flu from the vaccine. The vaccine will not protect against colds or other illnesses that may cause fever. The vaccine is needed every year. What side effects may I notice from receiving this medicine? Side effects that you should report to your doctor or health care professional as soon as possible:  allergic reactions like skin rash, itching or hives, swelling of the face, lips, or tongue Side effects that usually do not require medical attention (report to your doctor or health care professional if they continue or are bothersome):  fever  headache  muscle aches and pains  pain, tenderness, redness, or swelling at site where injected  weak or tired This list may not describe all possible side effects. Call your doctor for medical advice about side effects. You may report side effects to FDA at 1-800-FDA-1088. Where should I keep my medicine? This vaccine is only given in a clinic, pharmacy, doctor's office, or other health care setting and will not be stored at home. NOTE: This sheet is a summary. It may not cover all possible information. If you have questions about this medicine, talk to your doctor, pharmacist, or health care provider.  2020 Elsevier/Gold Standard (2008-01-15 09:30:40)

## 2019-02-25 LAB — CBC
Hematocrit: 36.1 % — ABNORMAL LOW (ref 37.5–51.0)
Hemoglobin: 12.6 g/dL — ABNORMAL LOW (ref 13.0–17.7)
MCH: 33.1 pg — ABNORMAL HIGH (ref 26.6–33.0)
MCHC: 34.9 g/dL (ref 31.5–35.7)
MCV: 95 fL (ref 79–97)
Platelets: 607 10*3/uL — ABNORMAL HIGH (ref 150–450)
RBC: 3.81 x10E6/uL — ABNORMAL LOW (ref 4.14–5.80)
RDW: 13.1 % (ref 11.6–15.4)
WBC: 7.9 10*3/uL (ref 3.4–10.8)

## 2019-02-25 LAB — COMPREHENSIVE METABOLIC PANEL
ALT: 53 IU/L — ABNORMAL HIGH (ref 0–44)
AST: 68 IU/L — ABNORMAL HIGH (ref 0–40)
Albumin/Globulin Ratio: 1 — ABNORMAL LOW (ref 1.2–2.2)
Albumin: 4 g/dL (ref 3.8–4.9)
Alkaline Phosphatase: 42 IU/L (ref 39–117)
BUN/Creatinine Ratio: 13 (ref 9–20)
BUN: 8 mg/dL (ref 6–24)
Bilirubin Total: 0.3 mg/dL (ref 0.0–1.2)
CO2: 19 mmol/L — ABNORMAL LOW (ref 20–29)
Calcium: 9.7 mg/dL (ref 8.7–10.2)
Chloride: 101 mmol/L (ref 96–106)
Creatinine, Ser: 0.6 mg/dL — ABNORMAL LOW (ref 0.76–1.27)
GFR calc Af Amer: 130 mL/min/{1.73_m2} (ref >59)
GFR calc non Af Amer: 112 mL/min/{1.73_m2} (ref >59)
Globulin, Total: 3.9 g/dL (ref 1.5–4.5)
Glucose: 112 mg/dL — ABNORMAL HIGH (ref 65–99)
Potassium: 4.3 mmol/L (ref 3.5–5.2)
Sodium: 135 mmol/L (ref 134–144)
Total Protein: 7.9 g/dL (ref 6.0–8.5)

## 2019-02-25 LAB — IRON,TIBC AND FERRITIN PANEL
Ferritin: 594 ng/mL — ABNORMAL HIGH (ref 30–400)
Iron Saturation: 37 % (ref 15–55)
Iron: 88 ug/dL (ref 38–169)
Total Iron Binding Capacity: 235 ug/dL — ABNORMAL LOW (ref 250–450)
UIBC: 147 ug/dL (ref 111–343)

## 2019-02-25 LAB — VITAMIN B12: Vitamin B-12: 1504 pg/mL — ABNORMAL HIGH (ref 232–1245)

## 2019-02-25 LAB — TESTOSTERONE: Testosterone: 869 ng/dL (ref 264–916)

## 2019-02-25 LAB — TSH: TSH: 1.39 u[IU]/mL (ref 0.450–4.500)

## 2019-02-25 LAB — FOLATE: Folate: 10.9 ng/mL (ref >3.0)

## 2019-02-25 LAB — VITAMIN D 25 HYDROXY (VIT D DEFICIENCY, FRACTURES): Vit D, 25-Hydroxy: 27.5 ng/mL — ABNORMAL LOW (ref 30.0–100.0)

## 2019-02-26 ENCOUNTER — Telehealth: Payer: Self-pay

## 2019-02-26 NOTE — Telephone Encounter (Signed)
Contacted pt to go over lab results pt didn't answer lvm asking pt to give me a call at her earliest convenience  

## 2019-03-05 ENCOUNTER — Telehealth: Payer: Self-pay | Admitting: *Deleted

## 2019-03-05 NOTE — Telephone Encounter (Signed)
Patient has received his flu vaccine this season

## 2019-04-14 MED FILL — ?CARVEDILOL 12.5 MG TABLET: 12.5 | 30 days supply | Qty: 60 | Fill #1

## 2019-04-14 MED FILL — ?AMLODIPINE BESYLATE 10 MG: 10 | 30 days supply | Qty: 30 | Fill #3

## 2019-05-26 ENCOUNTER — Ambulatory Visit: Payer: Self-pay | Attending: Internal Medicine | Admitting: Internal Medicine

## 2019-05-26 ENCOUNTER — Other Ambulatory Visit: Payer: Self-pay

## 2019-05-26 DIAGNOSIS — E559 Vitamin D deficiency, unspecified: Secondary | ICD-10-CM

## 2019-05-26 DIAGNOSIS — R945 Abnormal results of liver function studies: Secondary | ICD-10-CM

## 2019-05-26 DIAGNOSIS — F1010 Alcohol abuse, uncomplicated: Secondary | ICD-10-CM

## 2019-05-26 DIAGNOSIS — R7989 Other specified abnormal findings of blood chemistry: Secondary | ICD-10-CM

## 2019-05-26 DIAGNOSIS — D75839 Thrombocytosis, unspecified: Secondary | ICD-10-CM | POA: Insufficient documentation

## 2019-05-26 DIAGNOSIS — E782 Mixed hyperlipidemia: Secondary | ICD-10-CM

## 2019-05-26 DIAGNOSIS — D473 Essential (hemorrhagic) thrombocythemia: Secondary | ICD-10-CM

## 2019-05-26 DIAGNOSIS — I10 Essential (primary) hypertension: Secondary | ICD-10-CM

## 2019-05-26 NOTE — Progress Notes (Signed)
Virtual Visit via Telephone Note Due to current restrictions/limitations of in-office visits due to the COVID-19 pandemic, this scheduled clinical appointment was converted to a telehealth visit  I connected with Stephen Bowers on 05/26/19 at 2:35 p.m by telephone and verified that I am speaking with the correct person using two identifiers. I am in my office.  The patient is at home.  Only the patient and myself participated in this encounter.  I discussed the limitations, risks, security and privacy concerns of performing an evaluation and management service by telephone and the availability of in person appointments. I also discussed with the patient that there may be a patient responsible charge related to this service. The patient expressed understanding and agreed to proceed.   History of Present Illness: HTN, vit Ddef, unspecified anemia , neuropathy in fingers,alcoholic pancreatitis, ETOH use disorder, HL, pre-DM.  Patient was last seen in August of this year.  Purpose of today's visit is chronic disease management.  HYPERTENSION Currently taking: see medication list Med Adherence: [x]  Yes - Norvasc, Coreg   []  No Medication side effects: []  Yes    [x]  No Adherence with salt restriction: []  Yes    [x]  No Home Monitoring?: [x]  Yes    Monitoring Frequency:  Wife checks for him 1-2 x a wk. Reports readings have been good but does not have his log with him to share Home BP results range: []  Yes    []  No SOB? []  Yes    [x]  No Chest Pain?: []  Yes    [x]  No Leg swelling?: []  Yes    [x]  No Headaches?: []  Yes    [x]  No Dizziness? []  Yes    [x]  No Comments:    ETOH:  "I have slowed it down to almost nothing."  Has 2 beers about 4 x a wk  Vit D def: last vit d level  was 27.5.  Taking OTC Vit 400 IU 2 daily.   PLT count elev at 607 on last CBC.  Anemia had improved  HL:  Has RF on Crestor but says he is out of the.  His wife fills his med box.  On last blood test, AST and ALT were  still elevated but had decreased compared to previous.   Outpatient Encounter Medications as of 05/26/2019  Medication Sig  . acetaminophen (TYLENOL) 500 MG tablet Take 1,000 mg by mouth daily.  [name] amLODipine (NORVASC) 10 MG tablet TAKE 1 TABLET (10 MG TOTAL) BY MOUTH DAILY.  . carvedilol (COREG) 12.5 MG tablet Take 1 tablet (12.5 mg total) by mouth 2 (two) times daily with a meal.  . cholecalciferol (VITAMIN D) 1000 units tablet Take 1 tablet (1,000 Units total) by mouth daily. (Patient taking differently: Take 1,000 Units by mouth 2 (two) times a week. )  . Multiple Vitamin (MULTIVITAMIN WITH MINERALS) TABS tablet Take 1 tablet by mouth daily.  . rosuvastatin (CRESTOR) 20 MG tablet Take 1 tablet (20 mg total) by mouth daily at 6 PM.  . Tetrahydrozoline HCl (VISINE OP) Place 1 drop into both eyes daily.  [name] thiamine 100 MG tablet Take 1 tablet (100 mg total) by mouth daily. (Patient not taking: Reported on 02/21/2018)   No facility-administered encounter medications on file as of 05/26/2019.     Observations/Objective:  Results for orders placed or performed in visit on 02/24/19  CBC  Result Value Ref Range   WBC 7.9 3.4 - 10.8 x10E3/uL   RBC 3.81 (L) 4.14 - 5.80 x10E6/uL  Hemoglobin 12.6 (L) 13.0 - 17.7 g/dL   Hematocrit 36.1 (L) 37.5 - 51.0 %   MCV 95 79 - 97 fL   MCH 33.1 (H) 26.6 - 33.0 pg   MCHC 34.9 31.5 - 35.7 g/dL   RDW 13.1 11.6 - 15.4 %   Platelets 607 (H) 150 - 450 x10E3/uL  Iron, TIBC and Ferritin Panel  Result Value Ref Range   Total Iron Binding Capacity 235 (L) 250 - 450 ug/dL   UIBC 147 111 - 343 ug/dL   Iron 88 38 - 169 ug/dL   Iron Saturation 37 15 - 55 %   Ferritin 594 (H) 30 - 400 ng/mL  VITAMIN D 25 Hydroxy (Vit-D Deficiency, Fractures)  Result Value Ref Range   Vit D, 25-Hydroxy 27.5 (L) 30.0 - 100.0 ng/mL  TSH  Result Value Ref Range   TSH 1.390 0.450 - 4.500 uIU/mL  Vitamin B12  Result Value Ref Range   Vitamin B-12 1,504 (H) 232 - 1,245 pg/mL   Folate  Result Value Ref Range   Folate 10.9 >3.0 ng/mL  Comprehensive metabolic panel  Result Value Ref Range   Glucose 112 (H) 65 - 99 mg/dL   BUN 8 6 - 24 mg/dL   Creatinine, Ser 0.60 (L) 0.76 - 1.27 mg/dL   GFR calc non Af Amer 112 >59 mL/min/1.73   GFR calc Af Amer 130 >59 mL/min/1.73   BUN/Creatinine Ratio 13 9 - 20   Sodium 135 134 - 144 mmol/L   Potassium 4.3 3.5 - 5.2 mmol/L   Chloride 101 96 - 106 mmol/L   CO2 19 (L) 20 - 29 mmol/L   Calcium 9.7 8.7 - 10.2 mg/dL   Total Protein 7.9 6.0 - 8.5 g/dL   Albumin 4.0 3.8 - 4.9 g/dL   Globulin, Total 3.9 1.5 - 4.5 g/dL   Albumin/Globulin Ratio 1.0 (L) 1.2 - 2.2   Bilirubin Total 0.3 0.0 - 1.2 mg/dL   Alkaline Phosphatase 42 39 - 117 IU/L   AST 68 (H) 0 - 40 IU/L   ALT 53 (H) 0 - 44 IU/L  Testosterone  Result Value Ref Range   Testosterone 869 264 - 916 ng/dL    Assessment and Plan: 1. Essential hypertension Patient reports blood pressure have been at goal.  Continue to check blood pressure at least twice a week with goal being 130/80 or lower.  Advised to write down the readings to share with me on next visit.  2. Alcohol use disorder, mild, abuse Strongly advised to quit given his history of pancreatitis.  3. Abnormal LFTs Most likely due to continued EtOH use.  Advised to abstain completely - Hepatic Function Panel; Future  4. Thrombocytosis (HCC) - CBC; Future  5. Mixed hyperlipidemia Hold off on taking Crestor given his continued use of EtOH and elevated liver enzymes.  I have told patient to let his wife know not to put it in his med box anymore  6. Vitamin D deficiency Advised to cut back to 400 IU daily   Follow Up Instructions: 4 mths   I discussed the assessment and treatment plan with the patient. The patient was provided an opportunity to ask questions and all were answered. The patient agreed with the plan and demonstrated an understanding of the instructions.   The patient was advised to call  back or seek an in-person evaluation if the symptoms worsen or if the condition fails to improve as anticipated.  I provided 10 minutes of non-face-to-face time  during this encounter.   Karle Plumber, MD

## 2019-05-27 ENCOUNTER — Ambulatory Visit: Payer: Self-pay | Admitting: Internal Medicine

## 2019-05-27 ENCOUNTER — Telehealth: Payer: Self-pay

## 2019-05-27 NOTE — Telephone Encounter (Signed)
Contacted pt to schedule 4 month f/u and lab visit for Tuesday pt didn't answer lvm asking pt to give a call back to schedule

## 2019-06-03 MED FILL — ?CARVEDILOL 12.5 MG TABLET: 12.5 | 30 days supply | Qty: 60 | Fill #2

## 2019-06-17 MED FILL — ?CARVEDILOL 12.5 MG TABLET: 12.5 | 30 days supply | Qty: 60 | Fill #2

## 2019-06-17 MED FILL — ?AMLODIPINE BESYLATE 10 MG: 10 | 30 days supply | Qty: 30 | Fill #0

## 2019-06-23 IMAGING — US US ABDOMEN LIMITED
1 series · 14 of 25 positions shown · non-contrast
Comparison: Prior CT from 09/26/2017

CLINICAL DATA: Initial evaluation for acute epigastric pain,
elevated lipase.

EXAM:
ULTRASOUND ABDOMEN LIMITED RIGHT UPPER QUADRANT

[Series 1: us abdomen limited · 14 of 66 slices shown]
[im 1/66]
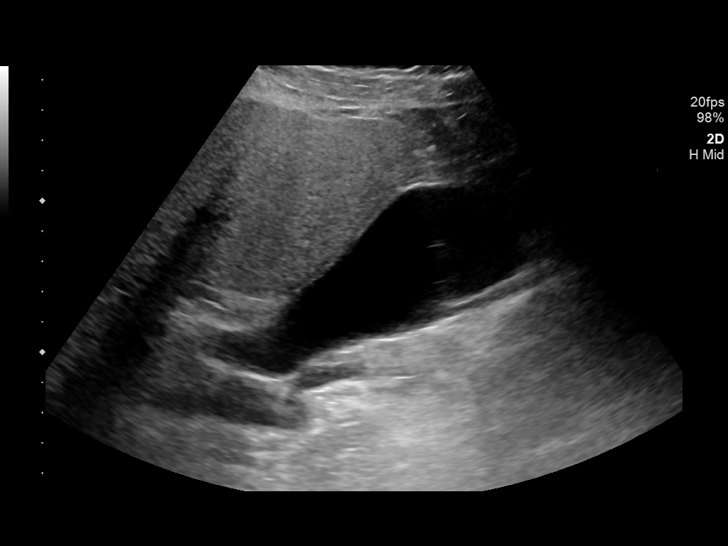
[im 6/66]
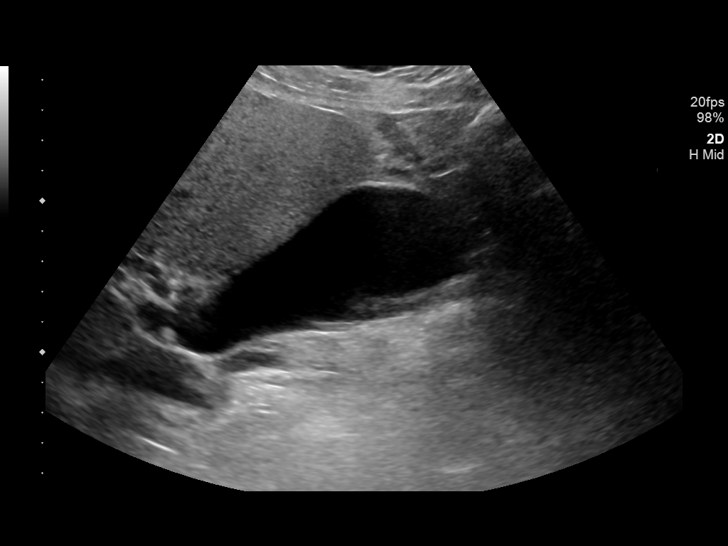
[im 11/66]
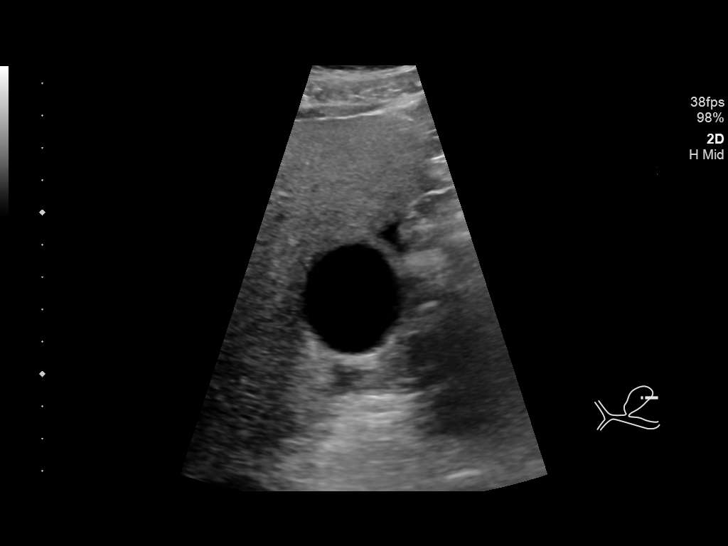
[im 17/66]
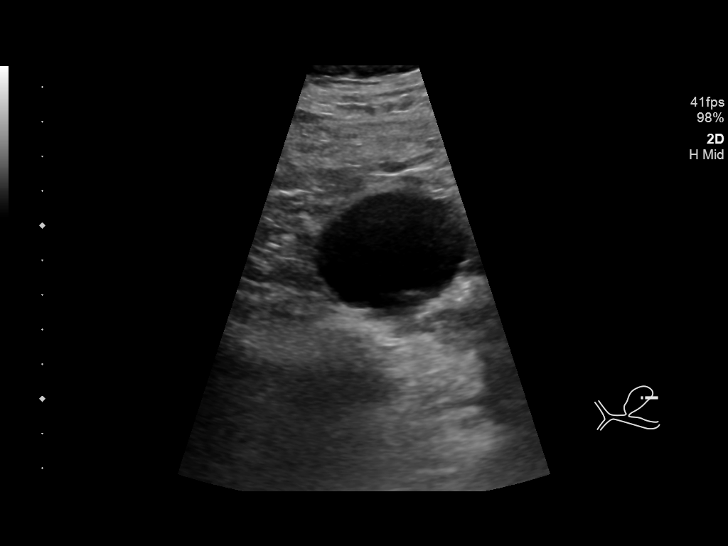
[im 22/66]
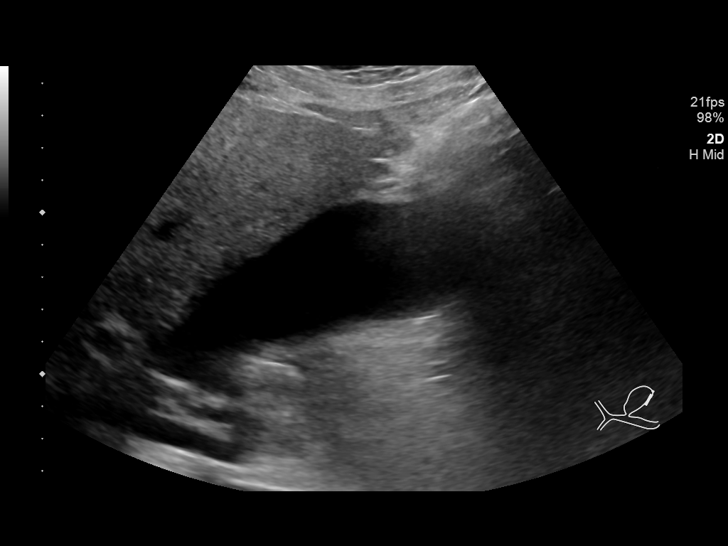
[im 25/66]
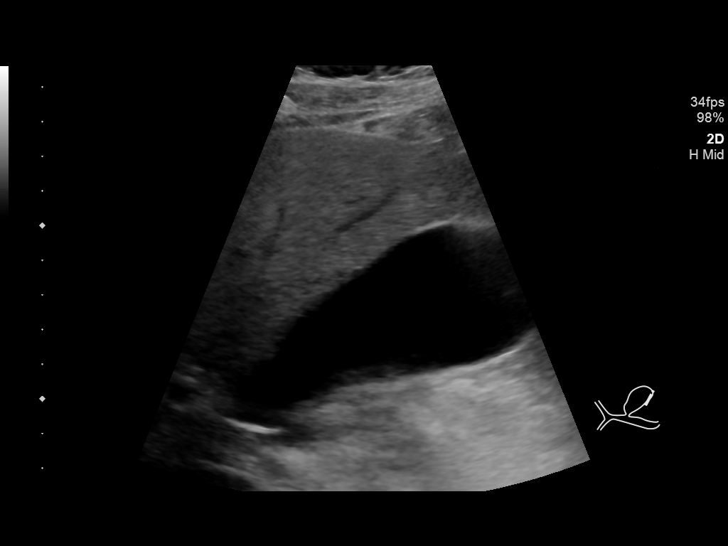
[im 30/66]
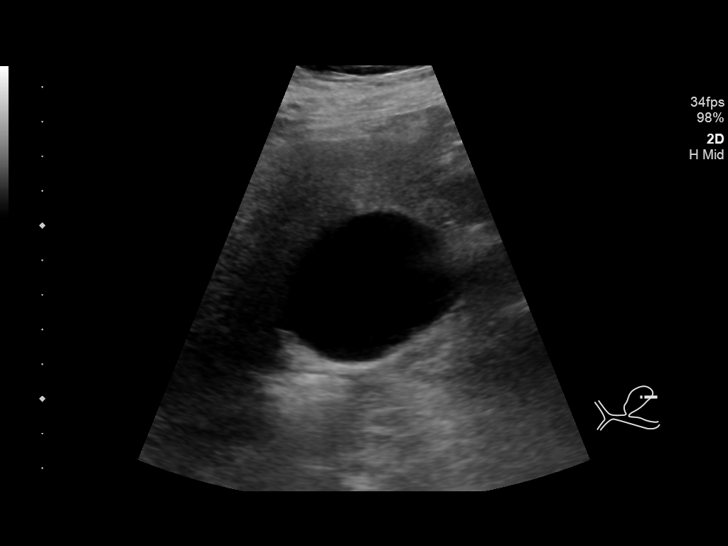
[im 36/66]
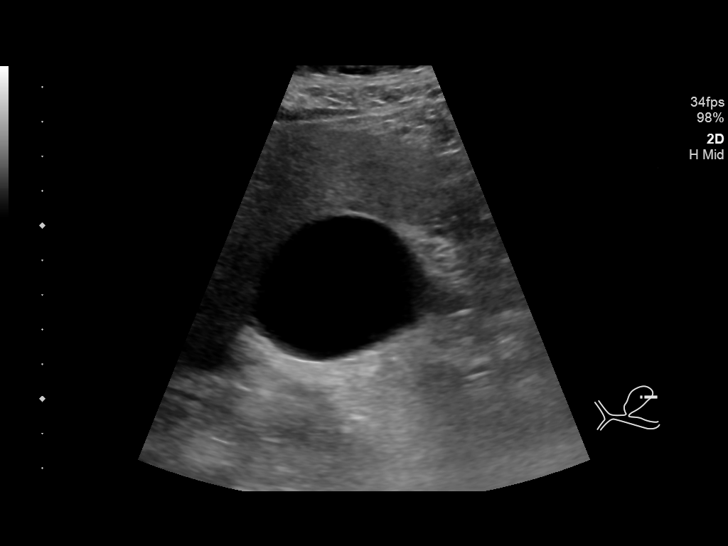
[im 41/66]
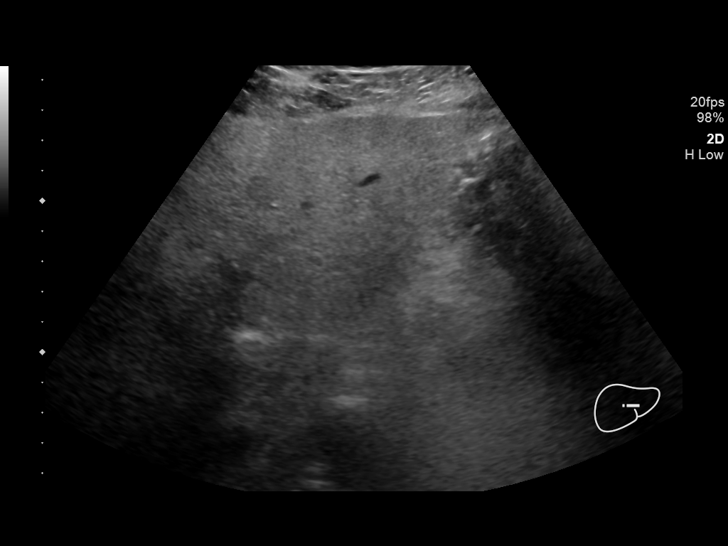
[im 44/66]
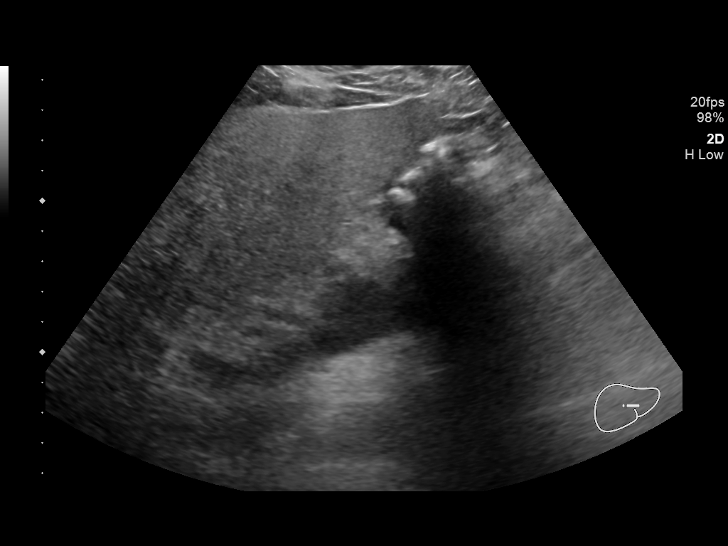
[im 49/66]
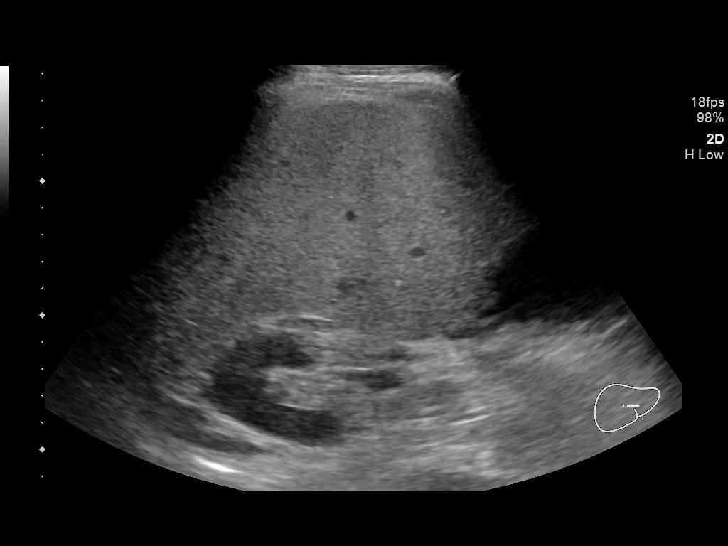
[im 55/66]
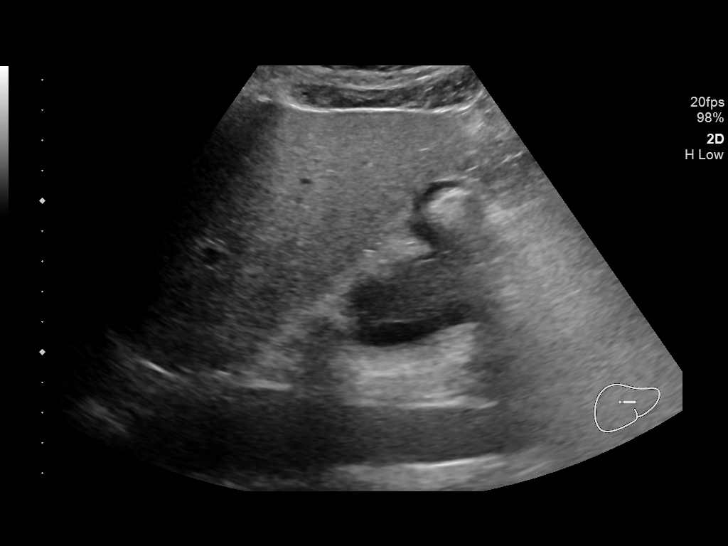
[im 60/66]
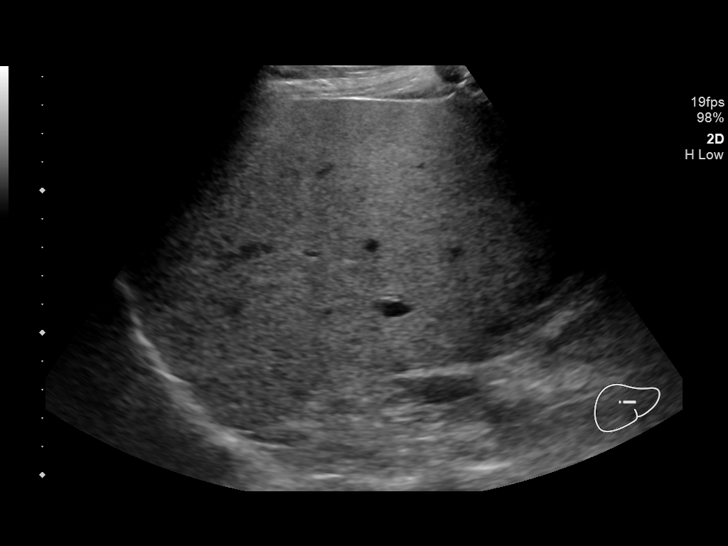
[im 66/66]
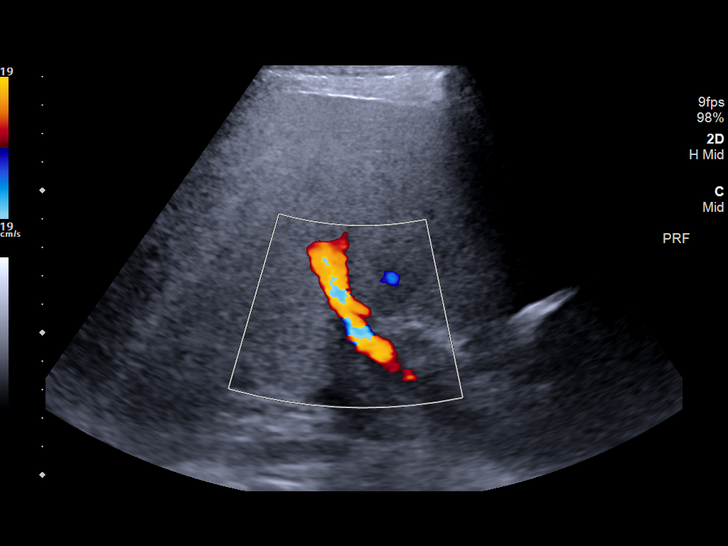

[14 of 25 positions shown; findings below may reference images not displayed]

FINDINGS: Gallbladder:

Small amount of sludge within the gallbladder lumen without
shadowing cholelithiasis. Gallbladder wall measure within normal
limits at 2.7 mm. Small volume free pericholecystic fluid. No
sonographic Murphy sign elicited on exam.

Common bile duct:

Diameter: 5.9 mm

Liver:

No focal lesion identified. Diffusely increased echogenicity,
consistent with steatosis. Portal vein is patent on color Doppler
imaging with normal direction of blood flow towards the liver.
IMPRESSION: 1. Gallbladder sludge without frank cholelithiasis. Associated small
volume free pericholecystic fluid. No other sonographic features to
suggest acute cholecystitis.
2. No biliary dilatation.
3. Hepatic steatosis.

## 2019-07-28 MED FILL — AMLODIPINE BESYLATE 10 MG T: 10 | 30 days supply | Qty: 30 | Fill #1

## 2019-07-28 MED FILL — ?CARVEDILOL 12.5 MG TABLET: 12.5 | 30 days supply | Qty: 60 | Fill #3

## 2019-09-08 MED FILL — ?CARVEDILOL 12.5 MG TABLET: 12.5 | 30 days supply | Qty: 60 | Fill #4

## 2019-09-08 MED FILL — AMLODIPINE BESYLATE 10 MG T: 10 | 30 days supply | Qty: 30 | Fill #2

## 2019-10-23 MED FILL — AMLODIPINE BESYLATE 10 MG T: 10 | 30 days supply | Qty: 30 | Fill #3

## 2019-10-23 MED FILL — ?CARVEDILOL 12.5 MG TABLET: 12.5 | 30 days supply | Qty: 60 | Fill #5

## 2020-01-08 ENCOUNTER — Ambulatory Visit: Payer: Self-pay | Admitting: Internal Medicine

## 2020-01-20 MED FILL — ?CARVEDILOL 12.5 MG TABLET: 12.5 | 30 days supply | Qty: 60 | Fill #6

## 2020-01-20 MED FILL — AMLODIPINE BESYLATE 10 MG T: 10 | 30 days supply | Qty: 30 | Fill #4

## 2020-04-11 ENCOUNTER — Other Ambulatory Visit: Payer: Self-pay

## 2020-04-11 ENCOUNTER — Encounter (HOSPITAL_COMMUNITY): Payer: Self-pay | Admitting: Emergency Medicine

## 2020-04-11 ENCOUNTER — Inpatient Hospital Stay (HOSPITAL_COMMUNITY)
Admission: EM | Admit: 2020-04-11 | Discharge: 2020-04-17 | DRG: 897 | Disposition: A | Discharge status: Home / Self Care | Payer: Self-pay | Attending: Family Medicine | Admitting: Family Medicine

## 2020-04-11 ENCOUNTER — Emergency Department (HOSPITAL_COMMUNITY): Payer: Self-pay

## 2020-04-11 DIAGNOSIS — F10230 Alcohol dependence with withdrawal, uncomplicated: Secondary | ICD-10-CM

## 2020-04-11 DIAGNOSIS — M25521 Pain in right elbow: Secondary | ICD-10-CM

## 2020-04-11 DIAGNOSIS — R600 Localized edema: Secondary | ICD-10-CM

## 2020-04-11 DIAGNOSIS — Z841 Family history of disorders of kidney and ureter: Secondary | ICD-10-CM

## 2020-04-11 DIAGNOSIS — E785 Hyperlipidemia, unspecified: Secondary | ICD-10-CM | POA: Diagnosis present

## 2020-04-11 DIAGNOSIS — Z9119 Patient's noncompliance with other medical treatment and regimen: Secondary | ICD-10-CM

## 2020-04-11 DIAGNOSIS — I129 Hypertensive chronic kidney disease with stage 1 through stage 4 chronic kidney disease, or unspecified chronic kidney disease: Secondary | ICD-10-CM | POA: Diagnosis present

## 2020-04-11 DIAGNOSIS — R569 Unspecified convulsions: Principal | ICD-10-CM

## 2020-04-11 DIAGNOSIS — N189 Chronic kidney disease, unspecified: Secondary | ICD-10-CM | POA: Diagnosis present

## 2020-04-11 DIAGNOSIS — R443 Hallucinations, unspecified: Secondary | ICD-10-CM | POA: Diagnosis present

## 2020-04-11 DIAGNOSIS — Y900 Blood alcohol level of less than 20 mg/100 ml: Secondary | ICD-10-CM | POA: Diagnosis present

## 2020-04-11 DIAGNOSIS — E559 Vitamin D deficiency, unspecified: Secondary | ICD-10-CM | POA: Diagnosis present

## 2020-04-11 DIAGNOSIS — E871 Hypo-osmolality and hyponatremia: Secondary | ICD-10-CM

## 2020-04-11 DIAGNOSIS — R509 Fever, unspecified: Secondary | ICD-10-CM

## 2020-04-11 DIAGNOSIS — I10 Essential (primary) hypertension: Secondary | ICD-10-CM

## 2020-04-11 DIAGNOSIS — E7800 Pure hypercholesterolemia, unspecified: Secondary | ICD-10-CM | POA: Diagnosis present

## 2020-04-11 DIAGNOSIS — F1010 Alcohol abuse, uncomplicated: Secondary | ICD-10-CM

## 2020-04-11 DIAGNOSIS — R7303 Prediabetes: Secondary | ICD-10-CM | POA: Diagnosis present

## 2020-04-11 DIAGNOSIS — F10239 Alcohol dependence with withdrawal, unspecified: Principal | ICD-10-CM | POA: Diagnosis present

## 2020-04-11 DIAGNOSIS — G312 Degeneration of nervous system due to alcohol: Secondary | ICD-10-CM | POA: Diagnosis present

## 2020-04-11 DIAGNOSIS — E860 Dehydration: Secondary | ICD-10-CM | POA: Diagnosis present

## 2020-04-11 DIAGNOSIS — M10021 Idiopathic gout, right elbow: Secondary | ICD-10-CM | POA: Diagnosis present

## 2020-04-11 DIAGNOSIS — D696 Thrombocytopenia, unspecified: Secondary | ICD-10-CM

## 2020-04-11 DIAGNOSIS — R4182 Altered mental status, unspecified: Secondary | ICD-10-CM

## 2020-04-11 DIAGNOSIS — Z87891 Personal history of nicotine dependence: Secondary | ICD-10-CM

## 2020-04-11 DIAGNOSIS — E876 Hypokalemia: Secondary | ICD-10-CM | POA: Diagnosis present

## 2020-04-11 DIAGNOSIS — Z811 Family history of alcohol abuse and dependence: Secondary | ICD-10-CM

## 2020-04-11 DIAGNOSIS — D638 Anemia in other chronic diseases classified elsewhere: Secondary | ICD-10-CM | POA: Diagnosis present

## 2020-04-11 DIAGNOSIS — F10939 Alcohol use, unspecified with withdrawal, unspecified: Secondary | ICD-10-CM

## 2020-04-11 DIAGNOSIS — N179 Acute kidney failure, unspecified: Secondary | ICD-10-CM | POA: Diagnosis present

## 2020-04-11 DIAGNOSIS — Z20822 Contact with and (suspected) exposure to covid-19: Secondary | ICD-10-CM | POA: Diagnosis present

## 2020-04-11 DIAGNOSIS — Z781 Physical restraint status: Secondary | ICD-10-CM

## 2020-04-11 DIAGNOSIS — R441 Visual hallucinations: Secondary | ICD-10-CM | POA: Diagnosis not present

## 2020-04-11 DIAGNOSIS — Z833 Family history of diabetes mellitus: Secondary | ICD-10-CM

## 2020-04-11 DIAGNOSIS — M109 Gout, unspecified: Secondary | ICD-10-CM

## 2020-04-11 DIAGNOSIS — K700 Alcoholic fatty liver: Secondary | ICD-10-CM | POA: Diagnosis present

## 2020-04-11 DIAGNOSIS — D649 Anemia, unspecified: Secondary | ICD-10-CM | POA: Diagnosis present

## 2020-04-11 DIAGNOSIS — Z79899 Other long term (current) drug therapy: Secondary | ICD-10-CM

## 2020-04-11 DIAGNOSIS — R000 Tachycardia, unspecified: Secondary | ICD-10-CM | POA: Diagnosis present

## 2020-04-11 DIAGNOSIS — D6959 Other secondary thrombocytopenia: Secondary | ICD-10-CM | POA: Diagnosis present

## 2020-04-11 DIAGNOSIS — E872 Acidosis: Secondary | ICD-10-CM | POA: Diagnosis present

## 2020-04-11 DIAGNOSIS — M25421 Effusion, right elbow: Secondary | ICD-10-CM | POA: Diagnosis present

## 2020-04-11 DIAGNOSIS — K219 Gastro-esophageal reflux disease without esophagitis: Secondary | ICD-10-CM | POA: Diagnosis present

## 2020-04-11 LAB — COMPREHENSIVE METABOLIC PANEL
ALT: 73 U/L — ABNORMAL HIGH (ref 0–44)
AST: 242 U/L — ABNORMAL HIGH (ref 15–41)
Albumin: 3.7 g/dL (ref 3.5–5.0)
Alkaline Phosphatase: 39 U/L (ref 38–126)
Anion gap: 19 — ABNORMAL HIGH (ref 5–15)
BUN: 13 mg/dL (ref 6–20)
CO2: 20 mmol/L — ABNORMAL LOW (ref 22–32)
Calcium: 8.6 mg/dL — ABNORMAL LOW (ref 8.9–10.3)
Chloride: 89 mmol/L — ABNORMAL LOW (ref 98–111)
Creatinine, Ser: 1.97 mg/dL — ABNORMAL HIGH (ref 0.61–1.24)
GFR, Estimated: 37 mL/min — ABNORMAL LOW (ref >60)
Glucose, Bld: 144 mg/dL — ABNORMAL HIGH (ref 70–99)
Potassium: 3.3 mmol/L — ABNORMAL LOW (ref 3.5–5.1)
Sodium: 128 mmol/L — ABNORMAL LOW (ref 135–145)
Total Bilirubin: 1.4 mg/dL — ABNORMAL HIGH (ref 0.3–1.2)
Total Protein: 8.2 g/dL — ABNORMAL HIGH (ref 6.5–8.1)

## 2020-04-11 LAB — CBC WITH DIFFERENTIAL/PLATELET
Abs Immature Granulocytes: 0.05 10*3/uL (ref 0.00–0.07)
Basophils Absolute: 0.1 10*3/uL (ref 0.0–0.1)
Basophils Relative: 1 %
Eosinophils Absolute: 0 10*3/uL (ref 0.0–0.5)
Eosinophils Relative: 0 %
HCT: 33.8 % — ABNORMAL LOW (ref 39.0–52.0)
Hemoglobin: 11.7 g/dL — ABNORMAL LOW (ref 13.0–17.0)
Immature Granulocytes: 1 %
Lymphocytes Relative: 9 %
Lymphs Abs: 0.7 10*3/uL (ref 0.7–4.0)
MCH: 33.3 pg (ref 26.0–34.0)
MCHC: 34.6 g/dL (ref 30.0–36.0)
MCV: 96.3 fL (ref 80.0–100.0)
Monocytes Absolute: 0.9 10*3/uL (ref 0.1–1.0)
Monocytes Relative: 11 %
Neutro Abs: 6.4 10*3/uL (ref 1.7–7.7)
Neutrophils Relative %: 78 %
Platelets: 50 10*3/uL — ABNORMAL LOW (ref 150–400)
RBC: 3.51 MIL/uL — ABNORMAL LOW (ref 4.22–5.81)
RDW: 13.6 % (ref 11.5–15.5)
WBC: 8.2 10*3/uL (ref 4.0–10.5)
nRBC: 0.5 % — ABNORMAL HIGH (ref 0.0–0.2)

## 2020-04-11 LAB — BASIC METABOLIC PANEL
Anion gap: 19 — ABNORMAL HIGH (ref 5–15)
BUN: 13 mg/dL (ref 6–20)
CO2: 19 mmol/L — ABNORMAL LOW (ref 22–32)
Calcium: 8.3 mg/dL — ABNORMAL LOW (ref 8.9–10.3)
Chloride: 94 mmol/L — ABNORMAL LOW (ref 98–111)
Creatinine, Ser: 1.27 mg/dL — ABNORMAL HIGH (ref 0.61–1.24)
GFR, Estimated: >60 mL/min (ref >60)
Glucose, Bld: 100 mg/dL — ABNORMAL HIGH (ref 70–99)
Potassium: 3.2 mmol/L — ABNORMAL LOW (ref 3.5–5.1)
Sodium: 132 mmol/L — ABNORMAL LOW (ref 135–145)

## 2020-04-11 LAB — LIPASE, BLOOD: Lipase: 31 U/L (ref 11–51)

## 2020-04-11 LAB — RESPIRATORY PANEL BY RT PCR (FLU A&B, COVID)
Influenza A by PCR: NEGATIVE
Influenza B by PCR: NEGATIVE
SARS Coronavirus 2 by RT PCR: NEGATIVE

## 2020-04-11 LAB — PROTIME-INR
INR: 1.2 (ref 0.8–1.2)
Prothrombin Time: 15 seconds (ref 11.4–15.2)

## 2020-04-11 LAB — HEPATITIS PANEL, ACUTE
HCV Ab: NONREACTIVE
Hep A IgM: NONREACTIVE
Hep B C IgM: NONREACTIVE
Hepatitis B Surface Ag: NONREACTIVE

## 2020-04-11 LAB — APTT: aPTT: 31 seconds (ref 24–36)

## 2020-04-11 LAB — ETHANOL: Alcohol, Ethyl (B): <10 mg/dL (ref <10)

## 2020-04-11 LAB — LACTIC ACID, PLASMA: Lactic Acid, Venous: 1.4 mmol/L (ref 0.5–1.9)

## 2020-04-11 LAB — CBG MONITORING, ED: Glucose-Capillary: 169 mg/dL — ABNORMAL HIGH (ref 70–99)

## 2020-04-11 MED ORDER — HEPARIN SODIUM (PORCINE) 5000 UNIT/ML IJ SOLN
5000.0000 [IU] | Freq: Three times a day (TID) | INTRAMUSCULAR | Status: DC
  Administered 2020-04-11: 5000 [IU] via SUBCUTANEOUS
  Filled 2020-04-11: qty 1

## 2020-04-11 MED ORDER — LACTATED RINGERS IV SOLN: INTRAVENOUS | Status: DC

## 2020-04-11 MED ORDER — THIAMINE HCL 100 MG/ML IJ SOLN
100.0000 mg | Freq: Every day | INTRAMUSCULAR | Status: DC
  Administered 2020-04-11 – 2020-04-12 (×3): 100 mg via INTRAVENOUS
  Filled 2020-04-11 (×4): qty 2

## 2020-04-11 MED ORDER — SODIUM CHLORIDE 0.9 % IV BOLUS
1000.0000 mL | Freq: Once | INTRAVENOUS | Status: AC
  Administered 2020-04-11: 1000 mL via INTRAVENOUS

## 2020-04-11 MED ORDER — LORAZEPAM 2 MG/ML IJ SOLN: 1.0000 mg | Freq: Four times a day (QID) | INTRAMUSCULAR | Status: DC

## 2020-04-11 MED ORDER — THIAMINE HCL 100 MG PO TABS
100.0000 mg | ORAL_TABLET | Freq: Every day | ORAL | Status: DC
  Administered 2020-04-13 – 2020-04-17 (×5): 100 mg via ORAL
  Filled 2020-04-11 (×5): qty 1

## 2020-04-11 MED ORDER — POLYETHYLENE GLYCOL 3350 17 G PO PACK: 17.0000 g | PACK | Freq: Every day | ORAL | Status: DC | PRN

## 2020-04-11 MED ORDER — AMLODIPINE BESYLATE 10 MG PO TABS
10.0000 mg | ORAL_TABLET | Freq: Every day | ORAL | Status: DC
  Administered 2020-04-11 – 2020-04-17 (×6): 10 mg via ORAL
  Filled 2020-04-11 (×3): qty 1
  Filled 2020-04-11 (×2): qty 2
  Filled 2020-04-11 (×2): qty 1

## 2020-04-11 MED ORDER — ACETAMINOPHEN 650 MG RE SUPP: 650.0000 mg | Freq: Four times a day (QID) | RECTAL | Status: DC | PRN

## 2020-04-11 MED ORDER — ACETAMINOPHEN 325 MG PO TABS
650.0000 mg | ORAL_TABLET | Freq: Four times a day (QID) | ORAL | Status: DC | PRN
  Administered 2020-04-16 – 2020-04-17 (×3): 650 mg via ORAL
  Filled 2020-04-11 (×3): qty 2

## 2020-04-11 MED ORDER — LORAZEPAM 2 MG/ML IJ SOLN: 0.0000 mg | Freq: Two times a day (BID) | INTRAMUSCULAR | Status: DC

## 2020-04-11 MED ORDER — LORAZEPAM 1 MG PO TABS: 0.0000 mg | ORAL_TABLET | Freq: Two times a day (BID) | ORAL | Status: DC

## 2020-04-11 MED ORDER — FOLIC ACID 1 MG PO TABS
1.0000 mg | ORAL_TABLET | Freq: Every day | ORAL | Status: DC
  Administered 2020-04-11 – 2020-04-17 (×6): 1 mg via ORAL
  Filled 2020-04-11 (×6): qty 1

## 2020-04-11 MED ORDER — PANTOPRAZOLE SODIUM 20 MG PO TBEC
20.0000 mg | DELAYED_RELEASE_TABLET | Freq: Every day | ORAL | Status: DC
  Administered 2020-04-11: 20 mg via ORAL
  Filled 2020-04-11 (×2): qty 1

## 2020-04-11 MED ORDER — ADULT MULTIVITAMIN W/MINERALS CH
1.0000 | ORAL_TABLET | Freq: Every day | ORAL | Status: DC
  Administered 2020-04-11 – 2020-04-17 (×6): 1 via ORAL
  Filled 2020-04-11 (×6): qty 1

## 2020-04-11 MED ORDER — LORAZEPAM 2 MG/ML IJ SOLN
0.0000 mg | Freq: Four times a day (QID) | INTRAMUSCULAR | Status: DC
  Administered 2020-04-11: 1 mg via INTRAVENOUS
  Filled 2020-04-11: qty 1

## 2020-04-11 MED ORDER — LORAZEPAM 2 MG/ML IJ SOLN
2.0000 mg | Freq: Four times a day (QID) | INTRAMUSCULAR | Status: AC
  Administered 2020-04-11 – 2020-04-12 (×3): 2 mg via INTRAVENOUS
  Filled 2020-04-11 (×4): qty 1

## 2020-04-11 MED ORDER — LORAZEPAM 1 MG PO TABS: 0.0000 mg | ORAL_TABLET | Freq: Four times a day (QID) | ORAL | Status: DC

## 2020-04-11 NOTE — ED Notes (Signed)
Pt urinated himself. Pt now cleaned. New gown and bed sheets provided. Pt placed on male purwick.

## 2020-04-11 NOTE — ED Provider Notes (Addendum)
[facility] EMERGENCY DEPARTMENT Provider Note   CSN: [id number] Arrival date & time: 04/11/20  1233     History Chief Complaint  Patient presents with  . Seizures    Stephen Bowers is a 59 y.o. male with a past medical history of hypertension, CKD, alcohol abuse presenting to the ED with a chief complaint of seizure. History provided by wife at the bedside provides history. States that patient drinks alcohol daily, "a pint of liquor and a couple beers every day." This morning he drank 1 beer, she thought he was going to drink liquor as well but then he "put it back, I will think he drank it." She witness a seizure-like episode where "his whole body was shaking while he was sitting down." States that afterwards he was "confused, asking me where he was. We have been married for over 20 years and he has never had a seizure before so I was worried." She then called 911. Has noticed for the past 2 days, several episodes of nonbloody, nonbilious emesis and decreased appetite. Patient states that he remembers sleeping and then "next thing I knew I was in the ambulance."  Denies daily alcohol use, states that he only drinks on the weekend.  Drink about 4 beers yesterday and had 1 beer this morning at 10 to 10:30 AM.  No history of alcohol withdrawal seizures or other seizures in the past.  Denies any benzo use.  He denies chest pain, abdominal pain, diarrhea or sick contacts with similar symptoms.  HPI     Past Medical History:  Diagnosis Date  . Chronic musculoskeletal pain   . CKD (chronic kidney disease)    follows with [facility])  . High cholesterol Dx 2011  . Hypertension   . Pancolitis ([place])   . Wears partial dentures    upper only    Patient Active Problem List   Diagnosis Date Noted  . Seizure-like activity ([place]) 04/11/2020  . Thrombocytosis 05/26/2019  . Alcohol use disorder, mild, abuse 02/24/2019  . Alcoholic fatty liver 03/47/4259  .  Prediabetes 02/24/2019  . Normocytic anemia 02/24/2019  . Acute pancreatitis 12/20/2017  . Increased anion gap metabolic acidosis   . Severe sepsis ([place])   . Abnormal LFTs 12/04/2017  . Pancreatitis 12/04/2017  . Hyperlipidemia 10/16/2016  . AKI (acute kidney injury) ([place])   . HTN (hypertension) 03/02/2015  . Vitamin D deficiency 01/19/2015  . Low back pain potentially associated with radiculopathy 03/30/2014    Past Surgical History:  Procedure Laterality Date  . COLONOSCOPY  12/2014   hx polyps - [place]  . MULTIPLE TOOTH EXTRACTIONS         Family History  Problem Relation Age of Onset  . Liver disease Father   . Alcohol abuse Father   . Diabetes Sister   . Obesity Sister   . Cancer Mother        breast   . Kidney disease Mother   . Colon cancer Neg Hx   . Esophageal cancer Neg Hx   . Rectal cancer Neg Hx   . Stomach cancer Neg Hx   . Colon polyps Neg Hx     Social History   Tobacco Use  . Smoking status: Former Smoker    Packs/day: 0.25    Years: 6.00    Pack years: 1.50    Types: Cigarettes    Quit date: 1995    Years since quitting: 26.7  . Smokeless tobacco: Never Used  Vaping Use  . Vaping Use: Never used  Substance Use Topics  . Alcohol use: Yes    Alcohol/week: 6.0 standard drinks    Types: 6 Cans of beer per week    Comment: 6 drinks Beer /Liquor  . Drug use: No    Home Medications Prior to Admission medications   Medication Sig Start Date End Date Taking? Authorizing Provider  acetaminophen (TYLENOL) 500 MG tablet Take 1,000 mg by mouth daily.    [provider]  amLODipine (NORVASC) 10 MG tablet TAKE 1 TABLET (10 MG TOTAL) BY MOUTH DAILY. 02/24/19   Ladell Pier, MD  carvedilol (COREG) 12.5 MG tablet Take 1 tablet (12.5 mg total) by mouth 2 (two) times daily with a meal. 02/24/19   Ladell Pier, MD  Multiple Vitamin (MULTIVITAMIN WITH MINERALS) TABS tablet Take 1 tablet by mouth daily.    [provider]    Tetrahydrozoline HCl (VISINE OP) Place 1 drop into both eyes daily.    [provider]    Allergies    Patient has no known allergies.  Review of Systems   Review of Systems  Constitutional: Positive for appetite change. Negative for chills and fever.  HENT: Negative for ear pain, rhinorrhea, sneezing and sore throat.   Eyes: Negative for photophobia and visual disturbance.  Respiratory: Negative for cough, chest tightness, shortness of breath and wheezing.   Cardiovascular: Negative for chest pain and palpitations.  Gastrointestinal: Positive for nausea and vomiting. Negative for abdominal pain, blood in stool, constipation and diarrhea.  Genitourinary: Negative for dysuria, hematuria and urgency.  Musculoskeletal: Negative for myalgias.  Skin: Negative for rash.  Neurological: Positive for seizures. Negative for dizziness, weakness and light-headedness.    Physical Exam Updated Vital Signs BP 122/83   Pulse (!) 109   Temp 99 F (37.2 C) (Oral)   Resp 18   Ht 5\' 11"  (1.803 m)   Wt 85.7 kg   SpO2 96%   BMI 26.36 kg/m   Physical Exam Vitals and nursing note reviewed.  Constitutional:      General: He is not in acute distress.    Appearance: He is well-developed.  HENT:     Head: Normocephalic and atraumatic.     Nose: Nose normal.  Eyes:     General: No scleral icterus.       Right eye: No discharge.        Left eye: No discharge.     Conjunctiva/sclera: Conjunctivae normal.     Pupils: Pupils are equal, round, and reactive to light.  Cardiovascular:     Rate and Rhythm: Regular rhythm. Tachycardia present.     Heart sounds: Normal heart sounds. No murmur heard.  No friction rub. No gallop.   Pulmonary:     Effort: Pulmonary effort is normal. No respiratory distress.     Breath sounds: Normal breath sounds.  Abdominal:     General: Bowel sounds are normal. There is no distension.     Palpations: Abdomen is soft.     Tenderness: There is no abdominal  tenderness. There is no guarding.  Musculoskeletal:        General: Normal range of motion.     Cervical back: Normal range of motion and neck supple.  Skin:    General: Skin is warm and dry.     Findings: No rash.  Neurological:     General: No focal deficit present.     Mental Status: He is alert and oriented  to person, place, and time.     Cranial Nerves: No cranial nerve deficit.     Sensory: No sensory deficit.     Motor: No weakness or abnormal muscle tone.     Coordination: Coordination normal.     Comments: Pupils reactive. No facial asymmetry noted. Cranial nerves appear grossly intact. Sensation intact to light touch on face, BUE and BLE. Strength 5/5 in BUE and BLE.      ED Results / Procedures / Treatments   Labs (all labs ordered are listed, but only abnormal results are displayed) Labs Reviewed  CBC WITH DIFFERENTIAL/PLATELET - Abnormal; Notable for the following components:      Result Value   RBC 3.51 (*)    Hemoglobin 11.7 (*)    HCT 33.8 (*)    Platelets 50 (*)    nRBC 0.5 (*)    All other components within normal limits  COMPREHENSIVE METABOLIC PANEL - Abnormal; Notable for the following components:   Sodium 128 (*)    Potassium 3.3 (*)    Chloride 89 (*)    CO2 20 (*)    Glucose, Bld 144 (*)    Creatinine, Ser 1.97 (*)    Calcium 8.6 (*)    Total Protein 8.2 (*)    AST 242 (*)    ALT 73 (*)    Total Bilirubin 1.4 (*)    GFR, Estimated 37 (*)    Anion gap 19 (*)    All other components within normal limits  CBG MONITORING, ED - Abnormal; Notable for the following components:   Glucose-Capillary 169 (*)    All other components within normal limits  RESPIRATORY PANEL BY RT PCR (FLU A&B, COVID)  ETHANOL    EKG EKG Interpretation  Date/Time:  Sunday April 11 2020 12:45:37 EDT Ventricular Rate:  120 PR Interval:    QRS Duration: 83 QT Interval:  344 QTC Calculation: 486 R Axis:   65 Text Interpretation: Sinus tachycardia Atrial premature  complex Probable left atrial enlargement Probable left ventricular hypertrophy Borderline prolonged QT interval Since last tracing rate faster Confirmed by Isla Pence [phone]) on 04/11/2020 1:15:23 PM   Radiology CT Head Wo Contrast  Result Date: 04/11/2020 CLINICAL DATA:  Seizure EXAM: CT HEAD WITHOUT CONTRAST TECHNIQUE: Contiguous axial images were obtained from the base of the skull through the vertex without intravenous contrast. COMPARISON:  None. FINDINGS: Brain: No evidence of acute infarction, hemorrhage, hydrocephalus, extra-axial collection or mass lesion/mass effect. Vascular: No hyperdense vessel or unexpected calcification. Skull: Normal. Negative for fracture or focal lesion. Sinuses/Orbits: Mucosal thickening of the RIGHT maxillary sinus. Other: None. IMPRESSION: 1. No acute intracranial abnormality. Electronically Signed   By: Valentino Saxon MD   On: 04/11/2020 14:45    Procedures .Critical Care Performed by: Delia Heady, PA-C Authorized by: Delia Heady, PA-C   Critical care provider statement:    Critical care time (minutes):  35   Critical care was necessary to treat or prevent imminent or life-threatening deterioration of the following conditions:  Circulatory failure, CNS failure or compromise and renal failure   Critical care was time spent personally by me on the following activities:  Development of treatment plan with patient or surrogate, discussions with consultants, evaluation of patient's response to treatment, examination of patient, obtaining history from patient or surrogate, ordering and performing treatments and interventions, ordering and review of laboratory studies, ordering and review of radiographic studies, pulse oximetry, re-evaluation of patient's condition and review of old charts  I assumed direction of critical care for this patient from another provider in my specialty: no     (including critical care time)  Medications Ordered in  ED Medications  LORazepam (ATIVAN) injection 0-4 mg (2 mg Intravenous Given 04/11/20 1301)    Or  LORazepam (ATIVAN) tablet 0-4 mg ( Oral See Alternative 04/11/20 1301)  LORazepam (ATIVAN) injection 0-4 mg (has no administration in time range)    Or  LORazepam (ATIVAN) tablet 0-4 mg (has no administration in time range)  thiamine tablet 100 mg ( Oral See Alternative 04/11/20 1304)    Or  thiamine (B-1) injection 100 mg (100 mg Intravenous Given 04/11/20 1304)  sodium chloride 0.9 % bolus 1,000 mL (has no administration in time range)    ED Course  I have reviewed the triage vital signs and the nursing notes.  Pertinent labs & imaging results that were available during my care of the patient were reviewed by me and considered in my medical decision making (see chart for details).  Clinical Course as of Apr 11 1500  Sun Apr 11, 2020  1431 Baseline is less than 1  Creatinine(!): 1.97 [HK]  1432 Anion gap(!): 19 [HK]    Clinical Course User Index [HK] Delia Heady, PA-C   MDM Rules/Calculators/A&P                          59 year old male with past medical history of hypertension, alcohol abuse presenting to the ED with a chief complaint of seizure.  Wife witnessed seizure-like activity that lasted for about 1 minute just prior to arrival.  No history of seizures in the past.  Patient drinks alcohol including liquor and several beers daily.  Patient does not recall this event.  Appears that he was postictal.  Last drink was at 10 AM this morning.  Patient without any neurological deficits here.  He is alert and oriented x4.  No signs of trauma.  EKG shows sinus tachycardia without any ischemic changes.  Will need to obtain lab work and imaging.  Suspect seizure in the setting of alcohol withdrawal.  CT of the head is negative for acute abnormality.  Lab work significant for AKI, creatinine of 1.9 with his baseline is around 0.8.  He also has elevation in LFTs slightly higher than usual.   Elevated anion gap of 19.  CBC unremarkable.  Ethanol level is 0.  Have ordered IV fluids and CIWA protocol.  Patient will need to be admitted for ongoing management of his alcohol withdrawal seizures and AKI.  Consult to neuro: Dr. Cheral Marker who will contact family medicine admitting service for recommendations.  All imaging, if done today, including plain films, CT scans, and ultrasounds, independently reviewed by me, and interpretations confirmed via formal radiology reads.   Portions of this note were generated with L[employer]. Dictation errors may occur despite best attempts at proofreading.  Final Clinical Impression(s) / ED Diagnoses Final diagnoses:  Seizure-like activity ([place])  Alcohol abuse  AKI (acute kidney injury) [facility])    Rx / [place] Orders ED Discharge Orders    None         Delia Heady, PA-C 04/11/20 1539    Isla Pence, MD 04/11/20 1658    Isla Pence, MD 04/11/20 1659

## 2020-04-11 NOTE — ED Notes (Signed)
Labs not collect by previous clinician. Collected Lactic, BMP, and HIV labs at this time

## 2020-04-11 NOTE — H&P (Addendum)
[facility] Admission History and Physical Service Pager: [phone]  Patient name: Stephen Bowers Medical record number: [id number] Date of birth: Aug 28, 1960 Age: 59 y.o. Gender: male  Primary Care Provider: Ladell Pier, MD Consultants: neurology Code Status: FULL Emergency contact: [phone], Malachy Mood (wife)  Chief Complaint: Seizure-like activity  Assessment and Plan: Stephen Bowers is a 59 y.o. male presenting with seizure-like activity. PMH is significant for alcohol use disorder, pancreatitis, HTN, CKD, HLD.  Seizure-like activity Suspect alcohol withdrawal seizure given history of alcohol abuse and ethanol normal on admission.  Though there is question of postictal state, do not suspect primary seizure given absence of other signs (tongue biting, incontinence) and seizure lasting less than 1 minute. He reports tremors in his right hand sometimes which is improved with alcohol (though this could also represent essential tremor rather than alcohol withdrawal symptoms).  No prior history of alcohol withdrawal seizures. Pt is afebrile and without signs/sx of meningitis. Denies recent trauma/falls. No intracranial bleed or mass on CT head. BUN 13 and Glucose 144. Not likely uremia or hypoglycemic. No UDS obtained. Neurology curbsided, Dr. Cheral Marker who did not recommended seizure work up as cause is most likely secondary to alcohol withdrawal. He recommended scheduled lorazepam for the first 3 days, then transition to as needed dosing per CIWA protocol. Patient wants to stop drinking EtOH. Currently intact neurologically and appears to be at baseline. - admit to FPTS, med-tele, Dr. Owens Shark attending - labs: lipase, PT/INR, aPTT, hepatitis panel - scheduled IV lorazepam 2 mg q6h x 4 doses, then 1 mg q6h x 8 doses (per neurology recommendation), then prn per CIWA - monitor CIWA - seizure precautions - up with assistance - vitals per unit routine - PT / OT  evaluate and treat  - TOC consult: EtOH substance abuse - if seizure activity returns: consider EEG - Consider UDS   AKI Cr 1.97 on admission, baseline appears to be around 0.8.  Suspect prerenal, though could represent rhabdomyolysis given seizure (though not prolonged). - gentle IVF  - CK - AM CMP - avoid nephrotoxic agents  - s/p 1L in ED  - gentle IVF @ 1/2 maintenance  - encourage oral hydration   Anion gap metabolic acidosis With bicarb of 20 and anion gap of 19 on admission.  Ethanol induced lactic acidosis is a possibility. - follow up lactic acid   Hyponatremia Na 128 on admission.  Suspect component of dehydration given history of vomiting and beer potomania. - monitor on CMP  Hypokalemia K 3.3 on admission. Suspect GI losses secondary to vomiting. - monitor on CMP  HTN Home meds: amlodipine 10 mg, carvedilol 12.5 mg BID.  Per med rec, these were last refilled 7/20 for a 30-day supply and wife reporting noncompliance. BP on admission 136/92. - continue home amlodipine - holding carvedilol for now, consider restarting if BP still elevated  Transaminitis AST/ALT 242/73 on admission, likely alcohol-related given AST:ALT ratio > 2.  Also with history of alcoholic fatty liver. MELD score 23.  - f/u hepatitis panel, PT/INR, aPTT - Consider RUQ ultrasound vs CT ABD - f/u lipase  Thrombocytopenia Platelets 50 on admission, likely secondary to alcohol use.  Baseline appears to be 125-150.  Will continue to monitor. - AM CBC - Hold VTE ppx - SCDs  Normocytic anemia Hemoglobin 11.7 on admission. MCV 96.3.  Appears to be near his baseline.  Suspect component of myelosuppression from alcohol use.  Last colonoscopy in September 2019 with findings of  3 sessile polyps (2 to 5 mm) in the rectum, sigmoid colon, and transverse colon.  Repeat in 5 years. - labs: ferritin, iron, TIBC, vitamin B12  Alcohol use disorder Per wife, patient drinks alcohol daily (a pint of liquor and  a couple of beers every day). Pt experiences tremors and admitted new onset seizure 2/2 to alcohol withdrawl.  Patient does express interest in quitting alcohol. - folate, thiamine, multivitamin - alcohol cessation counseling - consider starting medication for alcohol cessation such as naltrexone -SW consult for EtOH abuse resources   Covid vaccine Unvaccinated, interested in receiving. - will coordinate vaccination  FEN/GI: heart healthy diet, LR @ 1/2 maintenance Prophylaxis: SCDs  Disposition: med-tele  History of Present Illness:  Stephen Bowers is a 59 y.o. male presenting with seizure-like activity.  History provided by ED provider and patient.   Patient states he was sleep and right hand was twitching.  Witnessed by wife and apparently his whole body was shaking and was confused afterwards.  Sx lasted about a minute. He states he was not confused or disoriented; he remembers gaining consciousness in the ambulance and reported feeling fine. Denies tongue biting, bowel and bladder incontinence. He drank 2-3 airplane bottles of brandy and 4-5 beers on Saturday night (last night). Denies drinking any EtOH today. No auditory or visual hallucinations. This is a typical day for him. Drinks "not too tough" on the weekdays. About 1-2 beers 1-2 days during the week. Nothing like this previously. Right hand tremors but resolves after drinking alcohol. Has had some vomiting. Denies recent sick contacts. Denies history of seizures.  Patient is not vaccinated against COVID but would like to be vaccinated.   Denies illicit drug use. Quit smoking >30 years ago.    Review Of Systems: Per HPI with the following additions:   Review of Systems  Constitutional: Negative for fever and weight loss.  HENT: Negative for sore throat.   Eyes: Negative for blurred vision.  Respiratory: Negative for cough and shortness of breath.   Cardiovascular: Negative for chest pain.  Gastrointestinal: Positive  for vomiting. Negative for abdominal pain, diarrhea and nausea.  Genitourinary: Negative for dysuria.  Musculoskeletal: Negative for falls.  Skin: Negative for rash.  Neurological: Positive for tremors and seizures. Negative for dizziness and headaches.       Hiccups  Psychiatric/Behavioral: Positive for substance abuse.    Patient Active Problem List   Diagnosis Date Noted  . Seizure-like activity ([place]) 04/11/2020  . Thrombocytosis 05/26/2019  . Alcohol use disorder, mild, abuse 02/24/2019  . Alcoholic fatty liver 51/70/0174  . Prediabetes 02/24/2019  . Normocytic anemia 02/24/2019  . Acute pancreatitis 12/20/2017  . Increased anion gap metabolic acidosis   . Severe sepsis ([place])   . Abnormal LFTs 12/04/2017  . Pancreatitis 12/04/2017  . Hyperlipidemia 10/16/2016  . AKI (acute kidney injury) ([place])   . HTN (hypertension) 03/02/2015  . Vitamin D deficiency 01/19/2015  . Low back pain potentially associated with radiculopathy 03/30/2014    Past Medical History: Past Medical History:  Diagnosis Date  . Chronic musculoskeletal pain   . CKD (chronic kidney disease)    follows with [facility]ie [facility]alk)  . High cholesterol Dx 2011  . Hypertension   . Pancolitis ([place])   . Wears partial dentures    upper only    Past Surgical History: Past Surgical History:  Procedure Laterality Date  . COLONOSCOPY  12/2014   hx polyps - [place]  . MULTIPLE TOOTH EXTRACTIONS  Social History: Social History   Tobacco Use  . Smoking status: Former Smoker    Packs/day: 0.25    Years: 6.00    Pack years: 1.50    Types: Cigarettes    Quit date: 1995    Years since quitting: 26.7  . Smokeless tobacco: Never Used  Vaping Use  . Vaping Use: Never used  Substance Use Topics  . Alcohol use: Yes    Alcohol/week: 6.0 standard drinks    Types: 6 Cans of beer per week    Comment: 6 drinks Beer /Liquor  . Drug use: No   Additional social history:  Please also refer to relevant  sections of EMR.  Family History: Family History  Problem Relation Age of Onset  . Liver disease Father   . Alcohol abuse Father   . Diabetes Sister   . Obesity Sister   . Cancer Mother        breast   . Kidney disease Mother   . Colon cancer Neg Hx   . Esophageal cancer Neg Hx   . Rectal cancer Neg Hx   . Stomach cancer Neg Hx   . Colon polyps Neg Hx     Allergies and Medications: No Known Allergies No current facility-administered medications on file prior to encounter.   Current Outpatient Medications on File Prior to Encounter  Medication Sig Dispense Refill  . acetaminophen (TYLENOL) 500 MG tablet Take 1,000 mg by mouth daily.    [name] amLODipine (NORVASC) 10 MG tablet TAKE 1 TABLET (10 MG TOTAL) BY MOUTH DAILY. 30 tablet 6  . carvedilol (COREG) 12.5 MG tablet Take 1 tablet (12.5 mg total) by mouth 2 (two) times daily with a meal. 60 tablet 6  . Multiple Vitamin (MULTIVITAMIN WITH MINERALS) TABS tablet Take 1 tablet by mouth daily.    . Tetrahydrozoline HCl (VISINE OP) Place 1 drop into both eyes daily.      Objective: BP 122/83   Pulse (!) 109   Temp 99 F (37.2 C) (Oral)   Resp 18   Ht 5\' 11"  (1.803 m)   Wt 85.7 kg   SpO2 96%   BMI 26.36 kg/m  Exam: General: alert and oriented, Middle-aged male, NAD Eyes: PERRL, EOMI, no scleral icterus ENTM: MMM, posterior oropharynx clear, no visible tongue trauma Neck: supple, normal ROM, no midline C-spine tenderness Cardiovascular: RRR, no murmurs Respiratory: CTAB, no increased work of breathing Gastrointestinal: soft, non-tender, mildly distended, +BS, no heptomegaly MSK: moves all extremities, no LE edema Derm: no rash seen, warm, well perfused, no telangiectasias Neuro: alert, oriented, conversant, CN II-XII intact, 5/5 strength bilaterally upper and lower extremities Psych: affect appropriate  Labs and Imaging: CBC BMET  Recent Labs  Lab 04/11/20 1346  WBC 8.2  HGB 11.7*  HCT 33.8*  PLT 50*   Recent Labs   Lab 04/11/20 1346  NA 128*  K 3.3*  CL 89*  CO2 20*  BUN 13  CREATININE 1.97*  GLUCOSE 144*  CALCIUM 8.6*     CT Head Wo Contrast  Result Date: 04/11/2020 CLINICAL DATA:  Seizure EXAM: CT HEAD WITHOUT CONTRAST TECHNIQUE: Contiguous axial images were obtained from the base of the skull through the vertex without intravenous contrast. COMPARISON:  None. FINDINGS: Brain: No evidence of acute infarction, hemorrhage, hydrocephalus, extra-axial collection or mass lesion/mass effect. Vascular: No hyperdense vessel or unexpected calcification. Skull: Normal. Negative for fracture or focal lesion. Sinuses/Orbits: Mucosal thickening of the RIGHT maxillary sinus. Other: None. IMPRESSION: 1. No acute  intracranial abnormality. Electronically Signed   By: Valentino Saxon MD   On: 04/11/2020 14:45     Zola Button, MD 04/11/2020, 3:15 PM PGY-1, [place] Intern pager: [phone], text pages welcome   FPTS Upper-Level Resident Addendum   I have independently interviewed and examined the patient. I have discussed the above with the original author and agree with their documentation. My edits for correction/addition/clarification are in blue Please see also any attending notes.   Lyndee Hensen, DO PGY-2, [facility] 04/11/2020 6:21 PM  FPTS Service pager: [phone] (text pages welcome through [place])

## 2020-04-11 NOTE — ED Triage Notes (Signed)
Arrived via EMS patient at home sitting on a couch family witnessed seizure like activity for 3-5 minutes. EMS reported initial BP by fire department 82/50 confused and EMS arrived repeat BP 120/76 ax4. Patient alert answering and following commands appropriate upon arrival. Denies pain states drink beer on weekends only and had 3 beers yesterday and 1 today.

## 2020-04-11 NOTE — Progress Notes (Signed)
   FMTS Attending Note: Stephen Singh, MD  Personal pager:  [phone] [place] Service Pager:  [phone]   I  have seen and examined this patient, reviewed their chart. I have discussed this patient with the resident. Will sign history and physical as available.   59 year old man presenting with new onset seizure at home likely due to alcohol withdrawal.   Patient seen alone. He reports a many year history of alcohol use. He has quit before--for as long as a month. Prior complications of alcohol use include pancreatitis and severe withdrawal requiring Precedex. He endorses several days of feeling ill, starting Thursday. He has several episodes of non-blood emesis. He decided to cut down on alcohol and reports he had only a two beers yesterday. He has been limiting oral intake due to nausea. Denies severe abdominal pain, chest pain, fevers, chills, sick exposures, headaches, new medications, falls, or substance use. He denies a family history of seizures.   PMH- HTN, HLD, hypertriglyceridemia in setting of pancreatitis, alcohol induced pancreatitis, hepatic steatosis, CKD (unclear stage)   PSH-Colonoscopy with Dr. [name] in past  FHx- no family history of seizures Social history- children are grown, lives with wife   HEENT: Sclera with slight icterus + under tongue . Dentition is moderate. Appears well hydrated. Neck: Supple Cardiac: Regular rate and rhythm. Normal S1/S2. No murmurs, rubs, or gallops appreciated. Lungs: Clear bilaterally to ascultation.  Abdomen: Normoactive bowel sounds distended, no fluid wave, non tender  Extremities: Warm, well perfused without edema.   Labs reviewed Notable for  Hyponatremia, hypokalemia, elevated creatinine, anion gap acidosis, elevated AST>>ALT, mildly elevated bilirubin, thrombocytopenia, INR negative, lipase negative.   EKG shows LVH, NSR.    1. New onset seizure, likely due to alcohol withdrawal. He has stopped alcohol before without seizures. CT  negative for acute findings, will discuss with Neurology--we appreciate their guidance. No fevers or signs of meningitis. No new medications or other substance ingestion.  2. History of complicated withdrawal--CIWAs, standing ativan, has previously required Precedex for agitation. If persistently >20 despite lorazepam, will discuss with CCM.   3. Hyponatremia--likely due to low solute + dehydration. Repeat in BMP, monitor closely. 4. Hypokalemia- replete 5. Thrombocytopenia-likely due to underlying liver disease, monitor for bleeding, hold heparin if platelets <50,000.  6. Alcohol use disorder- discussed, recommend AA and consideration of either gabapentin or naltrexone. He is amenable.    Will sign resident note as available.

## 2020-04-11 NOTE — ED Notes (Signed)
Pt confused asking where he is and attempting to exit bed. Removed male external cathater and urinated on self. Pt cleaned. New gown and pad provided. Reattached to external catheter and suction, mesh brief placed to secure device. Pt given fall risk bracelet, yellow socks, and fall risk magnet placed outside room. Pt visible from nurses station.

## 2020-04-11 NOTE — Progress Notes (Addendum)
FPTS Interim Progress Note  S: Patient denies any generalized or localized pain. States that he has been hospitalized for alcohol withdrawal in the past but has never required an ICU admission, per patient. This seems inconsistent with previous records demonstrated that patient was previously hospitalized for alcohol withdrawal and placed in the ICU requiring Precedex. Endorses last alcohol consumption was yesterday (10/9) but per ED note wife states that last drink was earlier this morning. Denies dyspnea or other symptoms. Has no concerns at this time.   O: BP (!) 131/98   Pulse (!) 101   Temp 99 F (37.2 C) (Oral)   Resp 16   Ht 5\' 11"  (1.803 m)   Wt 85.7 kg   SpO2 97%   BMI 26.36 kg/m   General: Patient sitting upright in bed, in no acute distress. HEENT: normocephalic, atraumatic, PERRLA  Resp: breathing comfortably on room air without signs of respiratory distress MSK: brief, mild twitching of right foot  Neuro: AOx3, no evidence of asterixis, mumbling speech, appropriately conversational, follows all commands appropriately  Psych: mood appropriate, no aggression noted   A/P: -continue current plan in place   Donney Dice, DO 04/11/2020, 9:13 PM PGY-1, [place] pager [phone]

## 2020-04-12 ENCOUNTER — Other Ambulatory Visit (HOSPITAL_COMMUNITY): Payer: Self-pay

## 2020-04-12 ENCOUNTER — Inpatient Hospital Stay (HOSPITAL_COMMUNITY): Payer: Self-pay

## 2020-04-12 ENCOUNTER — Encounter (HOSPITAL_COMMUNITY): Payer: Self-pay | Admitting: Family Medicine

## 2020-04-12 DIAGNOSIS — F10231 Alcohol dependence with withdrawal delirium: Secondary | ICD-10-CM

## 2020-04-12 DIAGNOSIS — E871 Hypo-osmolality and hyponatremia: Secondary | ICD-10-CM

## 2020-04-12 DIAGNOSIS — F1010 Alcohol abuse, uncomplicated: Secondary | ICD-10-CM

## 2020-04-12 LAB — LIPID PANEL
Cholesterol: 293 mg/dL — ABNORMAL HIGH (ref 0–200)
HDL: 95 mg/dL (ref >40)
LDL Cholesterol: 181 mg/dL — ABNORMAL HIGH (ref 0–99)
Total CHOL/HDL Ratio: 3.1 RATIO
Triglycerides: 87 mg/dL (ref <150)
VLDL: 17 mg/dL (ref 0–40)

## 2020-04-12 LAB — COMPREHENSIVE METABOLIC PANEL
ALT: 80 U/L — ABNORMAL HIGH (ref 0–44)
AST: 257 U/L — ABNORMAL HIGH (ref 15–41)
Albumin: 3.7 g/dL (ref 3.5–5.0)
Alkaline Phosphatase: 40 U/L (ref 38–126)
Anion gap: 21 — ABNORMAL HIGH (ref 5–15)
BUN: 9 mg/dL (ref 6–20)
CO2: 18 mmol/L — ABNORMAL LOW (ref 22–32)
Calcium: 8.7 mg/dL — ABNORMAL LOW (ref 8.9–10.3)
Chloride: 95 mmol/L — ABNORMAL LOW (ref 98–111)
Creatinine, Ser: 1.17 mg/dL (ref 0.61–1.24)
GFR, Estimated: >60 mL/min (ref >60)
Glucose, Bld: 102 mg/dL — ABNORMAL HIGH (ref 70–99)
Potassium: 3.1 mmol/L — ABNORMAL LOW (ref 3.5–5.1)
Sodium: 134 mmol/L — ABNORMAL LOW (ref 135–145)
Total Bilirubin: 1.8 mg/dL — ABNORMAL HIGH (ref 0.3–1.2)
Total Protein: 8.1 g/dL (ref 6.5–8.1)

## 2020-04-12 LAB — I-STAT ARTERIAL BLOOD GAS, ED
Acid-base deficit: 3 mmol/L — ABNORMAL HIGH (ref 0.0–2.0)
Bicarbonate: 21 mmol/L (ref 20.0–28.0)
Calcium, Ion: 1.13 mmol/L — ABNORMAL LOW (ref 1.15–1.40)
HCT: 35 % — ABNORMAL LOW (ref 39.0–52.0)
Hemoglobin: 11.9 g/dL — ABNORMAL LOW (ref 13.0–17.0)
O2 Saturation: 97 %
Patient temperature: 98.3
Potassium: 3.5 mmol/L (ref 3.5–5.1)
Sodium: 134 mmol/L — ABNORMAL LOW (ref 135–145)
TCO2: 22 mmol/L (ref 22–32)
pCO2 arterial: 31.7 mmHg — ABNORMAL LOW (ref 32.0–48.0)
pH, Arterial: 7.428 (ref 7.350–7.450)
pO2, Arterial: 87 mmHg (ref 83.0–108.0)

## 2020-04-12 LAB — CBC
HCT: 34.5 % — ABNORMAL LOW (ref 39.0–52.0)
Hemoglobin: 11.5 g/dL — ABNORMAL LOW (ref 13.0–17.0)
MCH: 32.8 pg (ref 26.0–34.0)
MCHC: 33.3 g/dL (ref 30.0–36.0)
MCV: 98.3 fL (ref 80.0–100.0)
Platelets: 47 10*3/uL — ABNORMAL LOW (ref 150–400)
RBC: 3.51 MIL/uL — ABNORMAL LOW (ref 4.22–5.81)
RDW: 13.9 % (ref 11.5–15.5)
WBC: 8.7 10*3/uL (ref 4.0–10.5)
nRBC: 0.2 % (ref 0.0–0.2)

## 2020-04-12 LAB — RETICULOCYTES
Immature Retic Fract: 24.5 % — ABNORMAL HIGH (ref 2.3–15.9)
RBC.: 3.51 MIL/uL — ABNORMAL LOW (ref 4.22–5.81)
Retic Count, Absolute: 78.3 10*3/uL (ref 19.0–186.0)
Retic Ct Pct: 2.2 % (ref 0.4–3.1)

## 2020-04-12 LAB — FERRITIN: Ferritin: 165 ng/mL (ref 24–336)

## 2020-04-12 LAB — IRON AND TIBC
Iron: 66 ug/dL (ref 45–182)
Saturation Ratios: 20 % (ref 17.9–39.5)
TIBC: 326 ug/dL (ref 250–450)
UIBC: 260 ug/dL

## 2020-04-12 LAB — MAGNESIUM: Magnesium: 1.5 mg/dL — ABNORMAL LOW (ref 1.7–2.4)

## 2020-04-12 LAB — HEMOGLOBIN A1C
Hgb A1c MFr Bld: 5.8 % — ABNORMAL HIGH (ref 4.8–5.6)
Mean Plasma Glucose: 119.76 mg/dL

## 2020-04-12 LAB — CBG MONITORING, ED: Glucose-Capillary: 103 mg/dL — ABNORMAL HIGH (ref 70–99)

## 2020-04-12 LAB — HIV ANTIBODY (ROUTINE TESTING W REFLEX): HIV Screen 4th Generation wRfx: NONREACTIVE

## 2020-04-12 LAB — VITAMIN B12: Vitamin B-12: 1087 pg/mL — ABNORMAL HIGH (ref 180–914)

## 2020-04-12 LAB — CK: Total CK: 645 U/L — ABNORMAL HIGH (ref 49–397)

## 2020-04-12 MED ORDER — MAGNESIUM SULFATE 2 GM/50ML IV SOLN: 2.0000 g | Freq: Once | INTRAVENOUS | Status: DC

## 2020-04-12 MED ORDER — LORAZEPAM 1 MG PO TABS
1.0000 mg | ORAL_TABLET | ORAL | Status: AC | PRN
  Administered 2020-04-12 – 2020-04-13 (×3): 1 mg via ORAL
  Filled 2020-04-12 (×3): qty 1

## 2020-04-12 MED ORDER — MAGNESIUM SULFATE 4 GM/100ML IV SOLN
4.0000 g | Freq: Once | INTRAVENOUS | Status: AC
  Administered 2020-04-12: 4 g via INTRAVENOUS
  Filled 2020-04-12 (×2): qty 100

## 2020-04-12 MED ORDER — POTASSIUM CHLORIDE 10 MEQ/100ML IV SOLN
10.0000 meq | INTRAVENOUS | Status: AC
  Administered 2020-04-12 (×4): 10 meq via INTRAVENOUS
  Filled 2020-04-12: qty 100

## 2020-04-12 MED ORDER — PANTOPRAZOLE SODIUM 40 MG IV SOLR
40.0000 mg | Freq: Every day | INTRAVENOUS | Status: DC
  Administered 2020-04-12 – 2020-04-13 (×2): 40 mg via INTRAVENOUS
  Filled 2020-04-12 (×2): qty 40

## 2020-04-12 MED ORDER — INFLUENZA VAC SPLIT QUAD 0.5 ML IM SUSY: 0.5000 mL | PREFILLED_SYRINGE | INTRAMUSCULAR | Status: DC | PRN

## 2020-04-12 MED ORDER — LORAZEPAM 2 MG/ML IJ SOLN
1.0000 mg | INTRAMUSCULAR | Status: AC | PRN
  Administered 2020-04-12: 4 mg via INTRAVENOUS
  Administered 2020-04-12: 1 mg via INTRAVENOUS
  Administered 2020-04-12 (×3): 2 mg via INTRAVENOUS
  Administered 2020-04-12: 3 mg via INTRAVENOUS
  Administered 2020-04-13 (×2): 2 mg via INTRAVENOUS
  Administered 2020-04-13: 1 mg via INTRAVENOUS
  Administered 2020-04-14 (×2): 4 mg via INTRAVENOUS
  Administered 2020-04-14 (×2): 2 mg via INTRAVENOUS
  Filled 2020-04-12 (×10): qty 1
  Filled 2020-04-12: qty 2
  Filled 2020-04-12 (×4): qty 1

## 2020-04-12 NOTE — Hospital Course (Addendum)
Stephen Bowers is a 59 y.o. male presenting with seizure-like activity. PMH is significant for alcohol use disorder, pancreatitis, HTN, CKD, HLD.  Seizure-like activity likely 2/2 alcohol withdrawal  Patient has a history of alcohol abuse. Normal EtOH on admission. Suspect alcohol withdrawal seizure lasting less than 1 minute given history. Question of postictal state, but unlikely d/t lack of any signs of seizure such as tongue biting or incontinence. CT head was negative for any intracranial bleed or mass lesion. Neurology did not recommend seizure work up as cause was most likely secondary to alcohol withdrawal. Neurology advised to place on scheduled lorazepam initially and then transition to prn with CIWA protocol. Patient became combative overnight and demonstrated harmful behaviors towards himself, pulling at condom catheter and at IV lines. He was placed in restraints and then moved to soft restraints the next morning. On 10/11, he remained somnolent throughout the day and was unable to cooperate with questions or exam. Scheduled Ativan was discontinued. CBGs were normal. EEG was ordered d/t concern for subclinical seizure. EEG, CT head, CXR, and ABG were all WNL. RUQ ultrasound significant for fatty liver. Overnight he became much more responsive. He denied any dyspnea, pain, auditory or visual hallucinations. Patient continued to clinically improve over the course of the following day. On 10/14, he only required 1 mg of Ativan over the course of the entire 24 hours.   Fever of unknown origin Fever initially on 10/14. Noted at 100.1>99.9>100.6 before returning to baseline. CXR and UA unremarkable. Urine and blood cultures showing no growth at 24 hours. Patient fevering this morning at 102.2>102.4. Treated with tylenol 650 mg. Patient evaluated overnight with no symptoms. He continues to deny N/V, congestion, rhinorrhea, cough, chills, or any pain this morning on exam. No active skin lesions on  extensive exam.  RUE pain All RUE imaging negative for acute fractures. IV transferred to left arm. Patient notes tenderness over the lateral epicondyle. Swollen, erythematous, warm to the touch, and tender to palpation on exam. RUE DVT US ordered on 10/15.  AKI, resolved Cr 1.97 on admission. Patient's baseline appears to be around 0.8. CK 645. Patient received 1 L NS in ED. Patient was started on LR at 63 mL/hr for one day. Repeat CMP showed Cr 0.95. Patient became much more arousable and was able to tolerate po. IVF were discontinued and he continued to clinically improve. Last Cr measured 0.65 on 10/15.  Hypokalemia, resolved K 3.3 on admission. Likely secondary to GI losses d/t vomiting. Repeat 3.1. Repleted with IV KCl 10 mEq x4. Repeat CMP the next day showed K 3.4. Repleted once again on 10/12 with Klor-con 40 mEq since he was tolerating po well. CMP on 10/15 measured 3.6.  Hyponatremia, resolved Na 128 on admission. Likely secondary to dehydration with history of vomiting and beer potomania. Patient received 1 L NS in ED. He was started on LR at 63 mL/hr for one day. His Na improved and was last measured at 131 on 10/15.  HTN Home medications include amlodipine 10 mg qd and carvedilol 12.5 mg BID. Patient's wife reports that he has been noncompliant with taking his antihypertensive medications. BP on admission measured 136/92. His home medications were held d/t his NPO status initially. As he became more arousable, his amlodipine was restarted. His BP and heart rate continued to be elevated. He was restarted on his home Coreg 12.5 mg BID on 10/15.  Transaminitis, improving AST/ALT 242/73 on admission, likely alcohol-related given AST:ALT ratio > 2. AST/ALT  elevated most recently at 95/69 on 10/15. MELD score 23. Lipase 31. Hepatitis panel negative. RUQ ultrasound significant for fatty liver, but no focal lesions or biliary pathology.  Thrombocytopenia, improving Platelets 50 on  admission, likely secondary to alcohol use.  Baseline appears to be 125-150. Platelets most recently 142 on 10/15, returned to baseline.  Normocytic anemia Hemoglobin 11.7 on admission. Appears to be near his baseline. Suspect component of myelosuppression from alcohol use. Hgb 11.5 today. Last colonoscopy in September 2019 with findings of 3 sessile polyps (2 to 5 mm) in the rectum, sigmoid colon, and transverse colon.  Repeat in 5 years. Iron 66, TIBC 326, ferritin 165, B12 1087.      Follow up  - prediabetes a1c 5.8

## 2020-04-12 NOTE — Progress Notes (Signed)
EEG complete - results pending 

## 2020-04-12 NOTE — ED Notes (Addendum)
Paged family medicine resident [facility]) about increased confusion. Also paged Larae Grooms, Family Medicine Intern about same

## 2020-04-12 NOTE — ED Notes (Signed)
Pt restraints removed briefly, 2 at a time, to change pt bedding.

## 2020-04-12 NOTE — ED Notes (Signed)
The pt opens his eyes when spoken to all 4 restraints non restricting no wounds

## 2020-04-12 NOTE — Progress Notes (Signed)
Checked on patient periodically throughout the afternoon.  He has remained somnolent throughout most of the morning.  Would briefly respond to sternal rub but did not open eyes.  Moving all extremities.  No focal neural deficits appreciated.  Pupils were equal bilaterally.  Vitals improving.  Most likely a result of the amount of Ativan in his system that was needed for his severe withdrawals overnight.  CBGs were normal.  Unlikely to be new onset stroke.  Possibility this is a subclinical seizure.  We will get ABG, CT head, CXR, and EEG.  Will stop standing Ativan.  Will reevaluate patient throughout the day.

## 2020-04-12 NOTE — Progress Notes (Signed)
Came to do EEG but patient is not in room and secretary is unsure. Will attempt later as schedule permits.

## 2020-04-12 NOTE — ED Notes (Signed)
Spoke with family medicine.  Will attempt soft restraints.  Requested IV meds over oral meds.

## 2020-04-12 NOTE — ED Notes (Signed)
Report received from [place], [place] remains in 4 point restraints, no injury noted. Pt is oriented to self, place and timing. He is anxious, resisting against restraints, attempting to pull at wires. He has no complaints. Tachy on the monitor.

## 2020-04-12 NOTE — ED Notes (Addendum)
Pt second dose of ATIVAN 3 mg verified with Admitting DO [place] and pharmacy.

## 2020-04-12 NOTE — ED Notes (Signed)
Pt is now resting, eyes closed, mumbling

## 2020-04-12 NOTE — Progress Notes (Addendum)
FPTS Interim Progress Note  S:Patient seen and examined at bedside. Denies dyspnea and chest pain. Denies any generalized or localized pain. Denies auditory or visual hallucinations. Reports no complaints or concerns at this time. Expresses that he wants to go home and feels that he is ready to do so. Explained to him that he requires more monitoring and is still on medication. He reports that his wife came to visit him earlier in the day.   O: BP (!) 151/106 (BP Location: Right Arm) Comment: Not. Nurse  Pulse 100   Temp 98.6 F (37 C) (Oral)   Resp 20   Ht 5\' 11"  (1.803 m)   Wt 85.7 kg   SpO2 98%   BMI 26.36 kg/m   General: Patient laying comfortably in bed, in no acute distress. HEENT: normocephalic, atraumatic, PERRLA CV: RRR, no murmurs or gallops auscultated  Resp: lungs clear to auscultation bilaterally, breathing comfortably on room air Ext: radial pulses strong and equal bilaterally  Neuro: AOx4, mumbling speech, no evidence of asterixis, follows all commands appropriately Psych: mood appropriate, no agitation noted   A/P: -continue to monitor CIWAs -Ativan per CIWA protocol -only if indicated, consider Ativan 1mg  q6h for agitation, per attending  -continue current plan   Donney Dice, DO 04/12/2020, 8:24 PM PGY-1, [place] [facility] Service pager [phone]

## 2020-04-12 NOTE — ED Notes (Signed)
Pt to CT

## 2020-04-12 NOTE — Progress Notes (Addendum)
FPTS Interim Progress Note  Went to reassess patient after receiving additional 3mg  dose of ativan for CIWA score of 18. He is currently calm and appears to be resting comfortably. Occasionally wakes up and looks around and then falls back to sleep. No longer diaphoretic. No obvious tremors on assessment. Continues to be tachycardic.   Still currently no sitter available but ED nurses are working to arrange one for patient in hopes to remove soft restraints as soon as able.    Plan: Per my assessment, CIWA score of ~5 at this time. Will hold of on additional ativan dose. Recommend reassessment of CIWA per protocol or if change in clinical status.  Will hold off on CCM consult at this time given improvement, however low threshold to consult if severe symptoms recur. Plan for removal of soft restraints once sitter can be arranged, sooner if clinically improves and patient no longer deemed unsafe to himself or others. Will continue to frequently assess indication.   Mina Marble Winchester, DO 04/12/2020, 3:01 AM PGY-3, [place] [facility] pager [phone]

## 2020-04-12 NOTE — ED Notes (Signed)
Per Dr Tarry Kos at bedside continue CIWA protocol repeat CIWA at 2 am if CIWA >20 please page admitting. Currently pt continues to be agitated become diaphoretic as he attempts to pull self from restraints. Admitting aware of pt's behavior.    Continue violent restraints d/t pt risk for injury to self and others. Goal to continue restraints till safely able to discontinue and have Air cabin crew. Order for sitter placed at this time.

## 2020-04-12 NOTE — ED Notes (Signed)
Mullis, DO at bedside. Pt is resting and calm at this time. DO recommends holding off next dose of ativan.

## 2020-04-12 NOTE — ED Notes (Signed)
Updated spouse.  Notified Family medicine that I did not give 11:30 ativan d/t pt CIWA being low, vs improving, etc.  Await response.

## 2020-04-12 NOTE — Progress Notes (Addendum)
FPTS Interim Progress Note  S: Received page from nurse that patient was having increased agitation, confusion, hallucination, and tachycardia. He is attempting to get out of bed and is pulling off all wires and IV's. They note that it was requiring 6 people to keep him safe. They fear he is going to hurt himself. They note that there is no current sitter available for the time being. They are requesting some sort of restraints to help ensure safety of patient.   O: BP (!) 158/109    Pulse (!) 147    Temp 99 F (37.2 C) (Oral)    Resp (!) 21    Ht 5\' 11"  (1.803 m)    Wt 85.7 kg    SpO2 99%    BMI 26.36 kg/m   On assessment, patient is attempting to repeatedly get out of bed.  He is only A&Ox1.  He is agitated and confused.  Diaphoretic. Obvious tremors throughout.  He is not redirectable.  He occasionally mumbles coherent speech.   A/P: Alcohol Withdrawal with seizures: Clinically worsening since evening evaluation. CIWA noted to be 24 on most recent check (previously 5-6). Now only alert to person. Agitated, diaphoretic, tremulous, tachycardic, hypertensive, and noted to have hallucinations. He is at high risk for development of seizure. Last drink on 10/9. Received scheduled 2mg  Ativan at 11:30pm, additional 2mg  at 12:30pm and then an additional 2mg  at 1AM (sum of 4mg  for elevated CIWA score). Given lack of sitter available for patient at time and fear of patient and staff safety, it was decided to initiate soft restraints until a sitter could become available. Per chart review, was admitted to ICU in June 2020 requiring Precedex for periods of explosive confusion. Will plan to reassess CIWA at 2am. If remains elevated >20 or if remains agitated, will consult CCM for further assistance.  Will also transfer patient to progressive status with more frequent CIWA monitoring.   Danna Hefty, DO 04/12/2020, 1:25 AM PGY-3, [place] [facility] Service pager [phone]

## 2020-04-12 NOTE — Progress Notes (Addendum)
Family Medicine Teaching Service Daily Progress Note Intern Pager: [phone]  Patient name: Stephen Bowers Medical record number: [id number] Date of birth: [birth year] Age: 59 y.o. Gender: male  Primary Care Provider: Ladell Pier, MD Consultants: Neurology Code Status: Full  Pt Overview and Major Events to Date:  Patient admitted 10/10  Assessment and Plan:  Stephen Bowers is a 59 y.o. male presenting with seizure-like activity. PMH is significant for alcohol use disorder, pancreatitis, HTN, CKD, HLD.  Seizure-like activity likely 2/2 alcohol withdrawal Patient has a history of alcohol abuse. EtOH was normal on admission. No incontinence or tongue biting. Seizure lasting less than 1 minute. He previously reported tremors in his right hand that improves with alcohol. No history of alcohol withdrawal seizures. Neurology did not recommend seizure work up as cause is most likely secondary to alcohol withdrawal. CT negative for any acute intracranial findings. Patient non responsive to questioning this morning at the bedside. Moans on sternal rub. Most recent CIWA 45, RN notes just giving patient scheduled Ativan prior to exam. Soft restraints in place. Labs this morning significant for Na 134, K 3.1, glucose 102, A1c 5.8, AST 257, ALT 80, T. Bili 1.8, CK 645, Cholesterol 293, HDL 95, LDL 181, triglycerides 87.  - Continue monitoring CIWA - Seizure precautions - PT/OT consult - Consider EEG if seizure activity resumes - TOC consult  - Continue scheduled Ativan per CIWA protocol  AKI, resolving Cr 1.97 on admission. Baseline appears to be around 0.8. CK 645. Repeat CMP shows Cr 1.17 and improving. - Continue gentile IVF with LR 63 mL/hr  Anion gap metabolic acidosis Bicarb at 20 and gap of 19 on admission. Ethanol induced lactic acidosis initially suspected. Lactic acid came back at 1.4. Anion gap at 21 this morning.  - Continue to monitor  Hypokalemia K 3.3 on admission. Likely  secondary to GI losses d/t vomiting. Most recent K 3.1 this morning. - Replete with IV KCl 10 mEq x4  Hyponatremia, resolving Na 128 on admission. Likely secondary to dehydration with history or vomiting and beer potomania. Last Na 134 this morning - NS bolus given in ED - Continue gentle IVF with LR 63 mL/hr - Continue to monitor  HTN Home medications include amlodipine 10 mg qd and carvedilol 12.5 mg BID. Wife reports that patient has been noncompliant. BP on admission 136/92. Last BP 161/97. - holding home meds while NPO  Tansaminitis AST/ALT 242/73 on admission, likely alcohol-related given AST:ALT ratio > 2. AST elevated most recently at 257. MELD score 23. Lipase 31. Hepatitis panel negative.  - RUQ ultrasound - Repeat am CMP  Thrombocytopenia Platelets 50 on admission, likely secondary to alcohol use.  Baseline appears to be 125-150. Platelets most recently 18. Will continue to monitor clinically. - Repeat am CBC - SCDs   Normocytic anemia Hemoglobin 11.7 on admission. Appears to be near his baseline. Suspect component of myelosuppression from alcohol use. Hgb 11.5 today. Last colonoscopy in September 2019 with findings of 3 sessile polyps (2 to 5 mm) in the rectum, sigmoid colon, and transverse colon.  Repeat in 5 years. Iron 66, TIBC 326, ferritin 165, B12 1087. Will continue to monitor clinically. - Repeat am CBC  Alcohol use disorder Per wife, patient drinks alcohol daily (a pint of liquor and a couple of beers every day).  - Patient not currently able to tolerate po intake - Holding po medications and supplements - IV thiamine 100 mg qd - Alcohol cessation counseling - Consider starting  medication for alcohol cessation such as naltrexone - Consult SW regarding EtOH use  GERD Patient home medications include pantoprazole 40 mg qd. Will continue home medications. - IV Protonix 40 mg qd  FEN/GI: NPO PPx: SCDs  Disposition: Med-tele  Subjective:  Patient with  increased agitation, confusion, hallucinations, and tachycardia overnight. He was attempting to get out of bed and pulling at wires and IVs. RN noted requiring 6 people to keep him safe, with fears that he is going to hurt himself. No sitters currently available. Soft restraints were put in place. Patient was initially unresponsive to verbal commands this morning on evaluation. RN noted that he was just recently given 1 mg Ativan d/t agitation. Repeat evaluation after rounds preformed. Patient still only arousable to sternal rub and only mumbling no. Not able to answer questions or comply with full exam. Soft restraints still in place. Objective: Temp:  [98.3 F (36.8 C)-98.6 F (37 C)] 98.3 F (36.8 C) (10/11 0402) Pulse Rate:  [89-148] 104 (10/11 1239) Resp:  [10-25] 21 (10/11 1100) BP: (113-177)/(80-146) 151/95 (10/11 1239) SpO2:  [92 %-100 %] 99 % (10/11 1100) Physical Exam: General: Patient is a somnolent male, lying supine in bed, soft restraints in place, in no apparent distress. Cardiovascular:  Normal S1 and S2 with no murmurs, rubs, or gallops. RRR. Respiratory:  CTA in all fields with no wheezing, rales, or rhonchi. Chest rise is symmetrical with no increased labor of breathing.  Abdomen:  Soft, non distended. No hepatosplenomegaly appreciated. Bowel sounds normoactive. Extremities:  No lower extremity edema or cyanosis appreciated. Soft restraints in place. Neurological: Somnolent and nonrespondent on exam.  Laboratory: Recent Labs  Lab 04/11/20 1346 04/12/20 0646  WBC 8.2 8.7  HGB 11.7* 11.5*  HCT 33.8* 34.5*  PLT 50* 47*   Recent Labs  Lab 04/11/20 1346 04/11/20 2017 04/12/20 0646  NA 128* 132* 134*  K 3.3* 3.2* 3.1*  CL 89* 94* 95*  CO2 20* 19* 18*  BUN 13 13 9   CREATININE 1.97* 1.27* 1.17  CALCIUM 8.6* 8.3* 8.7*  PROT 8.2*  --  8.1  BILITOT 1.4*  --  1.8*  ALKPHOS 39  --  40  ALT 73*  --  80*  AST 242*  --  257*  GLUCOSE 144* 100* 102*     Imaging/Diagnostic Tests: CT Head Wo Contrast  Result Date: 04/11/2020 CLINICAL DATA:  Seizure EXAM: CT HEAD WITHOUT CONTRAST TECHNIQUE: Contiguous axial images were obtained from the base of the skull through the vertex without intravenous contrast. COMPARISON:  None. FINDINGS: Brain: No evidence of acute infarction, hemorrhage, hydrocephalus, extra-axial collection or mass lesion/mass effect. Vascular: No hyperdense vessel or unexpected calcification. Skull: Normal. Negative for fracture or focal lesion. Sinuses/Orbits: Mucosal thickening of the RIGHT maxillary sinus. Other: None. IMPRESSION: 1. No acute intracranial abnormality. Electronically Signed   By: Valentino Saxon MD   On: 04/11/2020 14:45     Beryle Lathe, Medical Student 04/12/2020, 12:52 PM [place] Intern pager: [phone], text pages welcome  Resident Addendum I have separately seen and examined the patient.  I have discussed the findings and exam with the student and agree with the above note.  I helped develop the management plan that is described in the student's note and I agree with the content.     Addison Naegeli, MD PGY-3 [facility] residency program

## 2020-04-12 NOTE — Progress Notes (Signed)
FPTS Interim Progress Note  S:Received page from nurse, Claiborne Billings, regarding plan for patient. Updated nurse on plan thus far. She shares that patient is waking up and trying to resist against the soft restraints placed. Tachycardic in 140s. Patient is still not able to get sitter at this time.Upon my arrival, patient sleeping with improved tachycardia in the 120s.  O: BP (!) 163/103   Pulse (!) 142   Temp 98.3 F (36.8 C) (Oral)   Resp (!) 21   Ht 5\' 11"  (1.803 m)   Wt 85.7 kg   SpO2 98%   BMI 26.36 kg/m   General: Patient comfortably sleeping, in no acute distress. Resp: breathing comfortably on room air and satting at 99% Ext: soft restraints in place Neuro: asleep but easily arousable to voice, mild tremor of right hand   A/P: -continue with current plan -CIWAs improving, continue to monitor  -Ativan scheduled and prn per CIWA protocol -soft restraints reordered, will remove once patient is able to get a sitter and safe to himself and others   Donney Dice, DO 04/12/2020, 5:41 AM PGY-1, [place] pager [phone]

## 2020-04-12 NOTE — ED Notes (Signed)
Pt resiting against restraints, saying "given me my keys, give me my keys". He says he is at the doctors office, able to tell me his name. Mumbling intermittently. Skin is moist. Tachycardic. Updated admitting team on status, requested reorder of restraints, and update on plan

## 2020-04-12 NOTE — ED Notes (Signed)
Larae Grooms, Family Medicine Intern spoke with this RN. Family Medicine coming down to evaluate patient and increase CIWA checks. Larae Grooms made aware of increased confusion, hallucinations and agitation.  This RN is at bedside with the pt at this time.

## 2020-04-12 NOTE — Discharge Summary (Addendum)
[facility] Discharge Summary  Patient name: Stephen Bowers Medical record number: [id number] Date of birth: [birth year] Age: 59 y.o. Gender: male Date of Admission: 04/11/2020  Date of Discharge: 04/17/20 Admitting Physician: Danna Hefty, DO  Primary Care Provider: Ladell Pier, MD Consultants: Neurology  Indication for Hospitalization: Seizure-like activity 2/2 alcohol withdrawl  Discharge Diagnoses/Problem List:  Alcohol withdrawal AKI Anion gap metabolic acidosis Hypokalemia Hyponatremia HTN Transaminitis Thrombocytopenia Normocytic anemia Alcohol use disorder GERD HLD Alcoholic fatty liver Prediabetes  Disposition: Home  Discharge Condition: Stable and improved  Discharge Exam:   Physical Exam: BP 137/88 (BP Location: Left Arm)   Pulse 99   Temp 99.4 F (37.4 C) (Oral)   Resp 18   Ht 5\' 11"  (1.803 m)   Wt 85.7 kg   SpO2 97%   BMI 26.36 kg/m   General: Patient sitting upright in bed, in no acute distress. Cardiovascular: RRR, no murmurs or gallops  Respiratory: lungs clear to auscultation bilaterally, breathing comfortably on room air  Abdomen: soft, nontender, presence of active bowel sounds Neuro: AOx4, appropriately conversational  Extremities: radial and distal pulses intact bilaterally, no LE edema noted  Taken from Dr. Gloriajean Dell progress note on the day of discharge.   Brief Hospital Course:  Stephen Bowers a 44 y.o.malepresenting with seizure-like activity. PMH is significant foralcohol use disorder, pancreatitis, HTN, CKD, HLD.  Seizure-like activity likely 2/2 alcohol withdrawal  Patient has a history of alcohol abuse. Normal EtOH on admission. Suspect alcohol withdrawal seizure lasting less than 1 minute given history. CT head was negative for any intracranial bleed or mass lesion. Neurology did not recommend seizure work up as cause was most likely secondary to alcohol withdrawal. Neurology  advised to place on scheduled lorazepam initially and then transition to prn with CIWA protocol. EEG was ordered d/t concern for subclinical seizure. EEG, CT head, CXR, and ABG were all WNL. RUQ ultrasound significant for fatty liver. Patient required no Ativan >24 hours prior to discharge.   AKI Cr 1.97 on admission. Patient's baseline appears to be around 0.8. Last creatine on the day of discharge.   Hyponatremia Hyponatremia secondary to dehydration with history of vomiting and beer potomania. His Na improved and was last measured at 131 on 10/15.  HTN Home medications include amlodipine 10 mg qd and carvedilol 12.5 mg BID. Medications were re-started and prescribed at discharge.   Transaminitis AST/ALT 242/73 on admission, likely alcohol-related. Most recently at95/69 on 10/15. Hepatitis panel negative.RUQ ultrasound significant for fatty liver, but no focal lesions or biliary pathology.  Thrombocytopenia Platelets 50 on admission, likely secondary to alcohol use.Baseline appears to be 125-150.Platelets most recently142 on 10/15,returned to baseline.  Normocytic anemia Hemoglobin 11.7 on admission which is near his baseline. Suspect component of myelosuppression from alcohol use.  Anemia panel revealedIron 66, TIBC 326, ferritin 165, B12 1087.Hemoglobin stable on the day of discharge.  Issues for Follow Up:  1. Follow up with PCP regarding LFTs and hemoglobin. 2. Continue to encourage alcohol cessation.  3. F/u pending blood culture results. Assess pt for fever/signs of infection.  4. Consider CBC and BMP  Significant Procedures:  None  Significant Labs and Imaging:  Recent Labs  Lab 04/15/20 0328 04/16/20 0220 04/17/20 0205  WBC 8.7 10.0 8.7  HGB 11.7* 11.5* 10.2*  HCT 33.2* 32.5* 29.1*  PLT 108* 142* 171   Recent Labs  Lab 04/12/20 0646 04/12/20 1512 04/13/20 0122 04/13/20 0122 04/14/20 0524 04/14/20 0524 04/14/20 1244 04/15/20  5364 04/15/20 0328  04/16/20 0220 04/17/20 0205  NA 134*   < > 134*  --  131*  --   --  131*  --  131* 132*  K 3.1*   < > 3.4*   < > 3.4*   < >  --  3.3*   < > 3.6 3.4*  CL 95*  --  97*  --  96*  --   --  97*  --  98 97*  CO2 18*  --  21*  --  19*  --   --  22  --  23 23  GLUCOSE 102*  --  92  --  148*  --   --  119*  --  118* 112*  BUN 9  --  6  --  7  --   --  7  --  5* 7  CREATININE 1.17  --  0.95  --  0.87  --   --  0.68  --  0.65 0.64  CALCIUM 8.7*  --  8.7*  --  9.0  --   --  8.8*  --  9.0 9.1  MG 1.5*  --   --   --  1.3*  --   --  1.9  --   --   --   PHOS  --   --   --   --   --   --  3.2  --   --   --   --   ALKPHOS 40  --  43  --  38  --   --  41  --  39 32*  AST 257*  --  259*  --  234*  --   --  126*  --  95* 74*  ALT 80*  --  93*  --  104*  --   --  83*  --  69* 55*  ALBUMIN 3.7  --  3.4*  --  3.3*  --   --  2.9*  --  2.9* 2.7*   < > = values in this interval not displayed.    DG Chest 2 View  Result Date: 04/15/2020 CLINICAL DATA:  Fever. EXAM: CHEST - 2 VIEW COMPARISON:  April 12, 2020. FINDINGS: The heart size and mediastinal contours are within normal limits. Both lungs are clear. The visualized skeletal structures are unremarkable. IMPRESSION: No active cardiopulmonary disease. Electronically Signed   By: Marijo Conception M.D.   On: 04/15/2020 08:51   DG Shoulder Right  Result Date: 04/14/2020 CLINICAL DATA:  Right upper extremity edema.  Pain.  No known injury EXAM: RIGHT SHOULDER - 2+ VIEW COMPARISON:  None. FINDINGS: No acute bony abnormality. Specifically, no fracture, subluxation, or dislocation. Slightly high-riding appearance of the right humeral head could reflect some underlying rotator cuff insufficiency or possibly related to nonstandard projection given patient difficulty with positioning. Mild glenohumeral and acromioclavicular arthrosis. Mild edematous changes in the soft tissues. Included portion of the right chest is unremarkable. IMPRESSION: 1. No acute osseous abnormality.  2. Mildly high-riding appearance of the right humeral head could reflect some underlying rotator cuff insufficiency or possibly projectional given patient difficulty with positioning. Electronically Signed   By: Lovena Le M.D.   On: 04/14/2020 15:55   DG ELBOW COMPLETE RIGHT (3+VIEW)  Result Date: 04/16/2020 CLINICAL DATA:  Pain and redness EXAM: RIGHT ELBOW - COMPLETE 3+ VIEW COMPARISON:  None. FINDINGS: Frontal, lateral, and bilateral oblique views were obtained. There is no  fracture or dislocation. Equivocal joint effusion. No joint space narrowing or erosion. IMPRESSION: Equivocal joint effusion. This is a finding that potentially may have arthropathic etiology or could be secondary to recent trauma. No fracture is evident. No dislocation. No joint space narrowing or erosion. Electronically Signed   By: Lowella Grip III M.D.   On: 04/16/2020 12:26   DG Wrist Complete Right  Result Date: 04/14/2020 CLINICAL DATA:  Upper extremity swelling, no injury EXAM: RIGHT WRIST - COMPLETE 3+ VIEW COMPARISON:  None. FINDINGS: No acute bony abnormality. Specifically, no fracture, subluxation, or dislocation. Minimal degenerative arthrosis without features of an underlying arthropathy. Remote posttraumatic deformity of the fifth metacarpal. Soft tissues are unremarkable. IMPRESSION: 1. No acute osseous abnormality. 2. Minimal degenerative arthrosis without features of an underlying arthropathy. 3. Remote posttraumatic deformity of the fifth metacarpal. Electronically Signed   By: Lovena Le M.D.   On: 04/14/2020 15:51   CT HEAD WO CONTRAST  Result Date: 04/12/2020 CLINICAL DATA:  Altered mental status. EXAM: CT HEAD WITHOUT CONTRAST TECHNIQUE: Contiguous axial images were obtained from the base of the skull through the vertex without intravenous contrast. COMPARISON:  April 11, 2020. FINDINGS: Brain: No evidence of acute infarction, hemorrhage, hydrocephalus, extra-axial collection or mass  lesion/mass effect. Vascular: No hyperdense vessel or unexpected calcification. Skull: Normal. Negative for fracture or focal lesion. Sinuses/Orbits: Right maxillary mucous retention cyst is noted. Other: None. IMPRESSION: No acute intracranial abnormality seen. Electronically Signed   By: Marijo Conception M.D.   On: 04/12/2020 16:49   CT Head Wo Contrast  Result Date: 04/11/2020 CLINICAL DATA:  Seizure EXAM: CT HEAD WITHOUT CONTRAST TECHNIQUE: Contiguous axial images were obtained from the base of the skull through the vertex without intravenous contrast. COMPARISON:  None. FINDINGS: Brain: No evidence of acute infarction, hemorrhage, hydrocephalus, extra-axial collection or mass lesion/mass effect. Vascular: No hyperdense vessel or unexpected calcification. Skull: Normal. Negative for fracture or focal lesion. Sinuses/Orbits: Mucosal thickening of the RIGHT maxillary sinus. Other: None. IMPRESSION: 1. No acute intracranial abnormality. Electronically Signed   By: Valentino Saxon MD   On: 04/11/2020 14:45   DG CHEST PORT 1 VIEW  Result Date: 04/12/2020 CLINICAL DATA:  59 year old male with altered mental status and seizure. EXAM: PORTABLE CHEST 1 VIEW COMPARISON:  Chest radiograph dated 12/20/2017 FINDINGS: Mild chronic interstitial coarsening. No focal consolidation, pleural effusion, or pneumothorax. The cardiac silhouette is within limits. No acute osseous pathology. IMPRESSION: No active disease. Electronically Signed   By: Anner Crete M.D.   On: 04/12/2020 15:05   DG Humerus Right  Result Date: 04/14/2020 CLINICAL DATA:  Right upper extremity edema, no known injury EXAM: RIGHT HUMERUS - 2+ VIEW COMPARISON:  None. FINDINGS: Diffuse mild edematous changes of the soft tissues. Humerus is intact. No acute bony abnormality. Specifically, no fracture, subluxation, or dislocation. IV cannula seen in the medial to the elbow with focal thickening and small focus of soft tissue gas near the  insertion site. IMPRESSION: 1. No acute osseous abnormality. 2. Diffuse mild edematous changes of the soft tissues. 3. IV cannula seen in the medial antecubital fossa with focal thickening and small focus of soft tissue gas near the insertion site. Correlate with visual inspection. Electronically Signed   By: Lovena Le M.D.   On: 04/14/2020 15:50   EEG adult  Result Date: 04/13/2020 Lora Havens, MD     04/13/2020  9:42 AM Patient Name: DIRON HADDON MRN: [id number] Epilepsy Attending: Lora Havens Referring Physician/Provider:  Dr. Addison Naegeli Date: 04/12/2020 Duration: 23.58 minutes Patient history: 59 year old male with alcohol withdrawal seizures.  EEG to evaluate for seizures. Level of alertness: Awake, asleep AEDs during EEG study: Ativan Technical aspects: This EEG study was done with scalp electrodes positioned according to the 10-20 International system of electrode placement. Electrical activity was acquired at a sampling rate of 500Hz  and reviewed with a high frequency filter of 70Hz  and a low frequency filter of 1Hz . EEG data were recorded continuously and digitally stored. Description: During awake state, no clear posterior dominant rhythm was seen.  Sleep was characterized by vertex waves, sleep spindles (12 to 14 Hz), maximal frontocentral region.  There is an excessive amount of 15 to 18 Hz beta activity distributed symmetrically and diffusely.  Hyperventilation and photic stimulation were not performed.   ABNORMALITY -Excessive beta, generalized IMPRESSION: This study is within normal limits. No seizures or epileptiform discharges were seen throughout the recording. The excessive beta activity seen in the background is most likely due to the effect of benzodiazepine and is a benign EEG pattern. Priyanka O Yadav   VAS [place] UPPER EXTREMITY VENOUS DUPLEX  Result Date: 04/17/2020 UPPER VENOUS STUDY  Indications: Pain Limitations: Poor ultrasound/tissue interface and patient  positioning. Comparison Study: No prior studies. Performing Technologist: Oliver Hum RVT  Examination Guidelines: A complete evaluation includes B-mode imaging, spectral Doppler, color Doppler, and power Doppler as needed of all accessible portions of each vessel. Bilateral testing is considered an integral part of a complete examination. Limited examinations for reoccurring indications may be performed as noted.  Right Findings: +----------+------------+---------+-----------+----------+-------+ RIGHT     CompressiblePhasicitySpontaneousPropertiesSummary +----------+------------+---------+-----------+----------+-------+ IJV           Full       Yes       Yes                      +----------+------------+---------+-----------+----------+-------+ Subclavian    Full       Yes       Yes                      +----------+------------+---------+-----------+----------+-------+ Axillary      Full       Yes       Yes                      +----------+------------+---------+-----------+----------+-------+ Brachial      Full       Yes       Yes                      +----------+------------+---------+-----------+----------+-------+ Radial        Full                                          +----------+------------+---------+-----------+----------+-------+ Ulnar         Full                                          +----------+------------+---------+-----------+----------+-------+ Cephalic      Full                                          +----------+------------+---------+-----------+----------+-------+  Basilic       Full                                          +----------+------------+---------+-----------+----------+-------+  Left Findings: +----------+------------+---------+-----------+----------+-------+ LEFT      CompressiblePhasicitySpontaneousPropertiesSummary +----------+------------+---------+-----------+----------+-------+ Subclavian    Full        Yes       Yes                      +----------+------------+---------+-----------+----------+-------+  Summary:  Right: No evidence of deep vein thrombosis in the upper extremity. No evidence of superficial vein thrombosis in the upper extremity.  Left: No evidence of thrombosis in the subclavian.  *See table(s) above for measurements and observations.  Diagnosing physician: Monica Martinez MD Electronically signed by Monica Martinez MD on 04/17/2020 at 3:31:47 PM.    Final    US Abdomen Limited RUQ  Result Date: 04/12/2020 CLINICAL DATA:  Alcohol abuse. EXAM: ULTRASOUND ABDOMEN LIMITED RIGHT UPPER QUADRANT COMPARISON:  Ultrasound abdomen 11/24/2018 FINDINGS: Gallbladder: No gallstones or wall thickening visualized. No sonographic Murphy sign noted by sonographer. Common bile duct: Diameter: 5 mm Liver: Diffusely increased echogenicity of the liver without focal liver lesion. Portal vein is patent on color Doppler imaging with normal direction of blood flow towards the liver. Other: Negative for ascites IMPRESSION: Negative for gallstones. Fatty infiltration of the liver without focal liver lesion. Electronically Signed   By: Franchot Gallo M.D.   On: 04/12/2020 14:56     Results/Tests Pending at Time of Discharge:   Discharge Medications:  Allergies as of 04/17/2020   No Known Allergies     Medication List    TAKE these medications   acetaminophen 650 MG CR tablet Commonly known as: TYLENOL Take 1,300 mg by mouth every 8 (eight) hours as needed for pain.   amLODipine 10 MG tablet Commonly known as: NORVASC TAKE 1 TABLET (10 MG TOTAL) BY MOUTH DAILY. What changed:   how much to take  how to take this  when to take this  additional instructions   carvedilol 12.5 MG tablet Commonly known as: COREG Take 1 tablet (12.5 mg total) by mouth 2 (two) times daily with a meal.   cholecalciferol 25 MCG (1000 UNIT) tablet Commonly known as: VITAMIN D3 Take 1,000 Units by  mouth daily.   colchicine 0.6 MG tablet Take 1 tablet (0.6 mg total) by mouth 2 (two) times daily for 2 doses.   Cyanocobalamin 3000 MCG Caps Take 3,000 mcg by mouth daily. Vitamin B12   ELDERBERRY PO Take 1 tablet by mouth daily.   multivitamin with minerals Tabs tablet Take 1 tablet by mouth daily.   NON-ASPIRIN PM PO Take 1 tablet by mouth at bedtime.   omeprazole 20 MG tablet Commonly known as: PRILOSEC OTC Take 20 mg by mouth daily as needed (acid reflux/indigestion).   TUMS PO Take 2-3 tablets by mouth 3 (three) times daily as needed (acid reflux/indigestion).   VISINE OP Place 1 drop into both eyes daily as needed (dry eyes).   VITAMIN C PO Take 1 tablet by mouth daily.   vitamin E 1000 UNIT capsule Take 1,000 Units by mouth daily.   XYZAL PO Take 1 tablet by mouth daily as needed (allergies).   ZINC PO Take 1 tablet by mouth daily.  Durable Medical Equipment  (From admission, onward)         Start     Ordered   04/17/20 1534  For home use only DME 3 n 1  Once        04/17/20 1534   04/17/20 1531  For home use only DME Walker  Once       Question:  Patient needs a walker to treat with the following condition  Answer:  Seizure ([place])   04/17/20 1530   04/17/20 1518  For home use only DME Shower stool  Once        04/17/20 1523          Discharge Instructions: Please refer to Patient Instructions section of EMR for full details.  Patient was counseled important signs and symptoms that should prompt return to medical care, changes in medications, dietary instructions, activity restrictions, and follow up appointments.   Follow-Up Appointments:  Follow-up Information    [facility] Follow up.   Why: RW, 3/1 Contact information: [address] [place] [address] [phone]        Ladell Pier, MD. Schedule an appointment as soon as possible for a visit.   Specialty: Internal Medicine Contact  information: [place] [address] [phone]               Lyndee Hensen, DO 04/17/2020, 4:13 PM

## 2020-04-13 DIAGNOSIS — D696 Thrombocytopenia, unspecified: Secondary | ICD-10-CM

## 2020-04-13 DIAGNOSIS — R4182 Altered mental status, unspecified: Secondary | ICD-10-CM

## 2020-04-13 LAB — CBC
HCT: 33.1 % — ABNORMAL LOW (ref 39.0–52.0)
Hemoglobin: 11.5 g/dL — ABNORMAL LOW (ref 13.0–17.0)
MCH: 33.9 pg (ref 26.0–34.0)
MCHC: 34.7 g/dL (ref 30.0–36.0)
MCV: 97.6 fL (ref 80.0–100.0)
Platelets: 60 10*3/uL — ABNORMAL LOW (ref 150–400)
RBC: 3.39 MIL/uL — ABNORMAL LOW (ref 4.22–5.81)
RDW: 14 % (ref 11.5–15.5)
WBC: 8 10*3/uL (ref 4.0–10.5)
nRBC: 0 % (ref 0.0–0.2)

## 2020-04-13 LAB — COMPREHENSIVE METABOLIC PANEL
ALT: 93 U/L — ABNORMAL HIGH (ref 0–44)
AST: 259 U/L — ABNORMAL HIGH (ref 15–41)
Albumin: 3.4 g/dL — ABNORMAL LOW (ref 3.5–5.0)
Alkaline Phosphatase: 43 U/L (ref 38–126)
Anion gap: 16 — ABNORMAL HIGH (ref 5–15)
BUN: 6 mg/dL (ref 6–20)
CO2: 21 mmol/L — ABNORMAL LOW (ref 22–32)
Calcium: 8.7 mg/dL — ABNORMAL LOW (ref 8.9–10.3)
Chloride: 97 mmol/L — ABNORMAL LOW (ref 98–111)
Creatinine, Ser: 0.95 mg/dL (ref 0.61–1.24)
GFR, Estimated: >60 mL/min (ref >60)
Glucose, Bld: 92 mg/dL (ref 70–99)
Potassium: 3.4 mmol/L — ABNORMAL LOW (ref 3.5–5.1)
Sodium: 134 mmol/L — ABNORMAL LOW (ref 135–145)
Total Bilirubin: 1.9 mg/dL — ABNORMAL HIGH (ref 0.3–1.2)
Total Protein: 7.8 g/dL (ref 6.5–8.1)

## 2020-04-13 MED ORDER — ENOXAPARIN SODIUM 40 MG/0.4ML ~~LOC~~ SOLN
40.0000 mg | SUBCUTANEOUS | Status: DC
  Administered 2020-04-13 – 2020-04-17 (×5): 40 mg via SUBCUTANEOUS
  Filled 2020-04-13 (×4): qty 0.4

## 2020-04-13 MED ORDER — PANTOPRAZOLE SODIUM 40 MG PO TBEC
40.0000 mg | DELAYED_RELEASE_TABLET | Freq: Every day | ORAL | Status: DC
  Administered 2020-04-14 – 2020-04-17 (×4): 40 mg via ORAL
  Filled 2020-04-13 (×4): qty 1

## 2020-04-13 MED ORDER — POTASSIUM CHLORIDE 20 MEQ PO PACK
40.0000 meq | PACK | Freq: Once | ORAL | Status: AC
  Administered 2020-04-13: 40 meq via ORAL
  Filled 2020-04-13: qty 2

## 2020-04-13 NOTE — Progress Notes (Signed)
Patient assessed.  Reviewed LPN documentation, and agree with the findings.  

## 2020-04-13 NOTE — Progress Notes (Addendum)
FPTS Interim Progress Note  S:Per chart review, CIWAs increased from 1 to 11. Went to go check on patient. Patient sleeping comfortably but awakens upon my arrival. Denies any nausea or vomiting. Denies any generalized or localized pain. Denies both visual or auditory hallucinations. Has no other concerns at this time. Expresses wanting to call his wife so she can pick him up to go home.   O: BP 140/88 (BP Location: Left Arm)   Pulse (!) 102   Temp 98.7 F (37.1 C) (Oral)   Resp 20   Ht 5\' 11"  (1.803 m)   Wt 85.7 kg   SpO2 97%   BMI 26.36 kg/m   General" Laying comfortably in bed, in no acute distress. No diaphoresis visualized. HEENT: normocephalic, atraumatic CV: mildly tachycardic, no murmurs or gallops auscultated  Resp: lungs clear to auscultation bilaterally, no signs of respiratory distress, breathing comfortably on room air Ext: radial pulses intact bilaterally  Neuro: AOx3, able to tell me that he is at [facility] but states that it is located in [place]. Aware that he is here for a seizure. Patient unable to complete cerebellar testing. Mumbling speech but appropriate conversation. Psych: mood appropriate , does not appear anxious, denies visual and auditory hallucinations, no agitation or aggression observed  A/P: -continue to monitor CIWAs -continue with current plan   Donney Dice, DO 04/13/2020, 11:09 PM PGY-1, [facility] pager [phone]

## 2020-04-13 NOTE — Procedures (Signed)
Patient Name: Stephen Bowers  MRN: [id number]  Epilepsy Attending: Lora Havens  Referring Physician/Provider: Dr. Addison Naegeli Date: 04/12/2020 Duration: 23.58 minutes  Patient history: 59 year old male with alcohol withdrawal seizures.  EEG to evaluate for seizures.  Level of alertness: Awake, asleep  AEDs during EEG study: Ativan  Technical aspects: This EEG study was done with scalp electrodes positioned according to the 10-20 International system of electrode placement. Electrical activity was acquired at a sampling rate of 500Hz  and reviewed with a high frequency filter of 70Hz  and a low frequency filter of 1Hz . EEG data were recorded continuously and digitally stored.   Description: During awake state, no clear posterior dominant rhythm was seen.  Sleep was characterized by vertex waves, sleep spindles (12 to 14 Hz), maximal frontocentral region.  There is an excessive amount of 15 to 18 Hz beta activity distributed symmetrically and diffusely.  Hyperventilation and photic stimulation were not performed.     ABNORMALITY -Excessive beta, generalized  IMPRESSION: This study is within normal limits. No seizures or epileptiform discharges were seen throughout the recording. The excessive beta activity seen in the background is most likely due to the effect of benzodiazepine and is a benign EEG pattern.   Kallen Delatorre Barbra Sarks

## 2020-04-13 NOTE — Evaluation (Addendum)
Occupational Therapy Evaluation Patient Details Name: Stephen Bowers MRN: [id number] DOB: [birth year] Today's Date: 04/13/2020    History of Present Illness Pt is a 59 y/o male presenting with seizure like activity likely due to alcohol withdrawl.  CIWA protocol. Found with AKI, hypokalemia. EEG negative.  PMH significant for alcohol use disorder, pancreatitis, HTN, CKD, HLD.    Clinical Impression   PTA patient reports independent, working and driving. Admitted for above and limited by problem list below, including impaired activity tolerance, elevated HR with minimal activity, impaired cognition.  Patient disoriented to time (date), follows simple commands but requires increased time for processing and initiation, presenting with decreased STM and perseverating on asking therapist to find out if his IV can come out. Session limited to EOB, HR increasing from 104 to 135 with minimal activity; requires min guard to min assist for bed mobility, setup to mod assist for ADLs, transfers not tested.  He reports living at home with his spouse who is available PRN.  Based on performance today, he will benefit from continued OT services while admitted and after dc at [facility] level, given 24/7 support and pending progress, to optimize independence and safety with ADls, mobility. Will follow.     Follow Up Recommendations  Home health OT;Supervision/Assistance - 24 hour (pending progress- may need SNF if doesn't progress  )    Equipment Recommendations  3 in 1 bedside commode    Recommendations for Other Services       Precautions / Restrictions Precautions Precautions: Fall Precaution Comments: watch HR  Restrictions Weight Bearing Restrictions: No      Mobility Bed Mobility Overal bed mobility: Needs Assistance Bed Mobility: Supine to Sit;Sit to Supine     Supine to sit: Min guard Sit to supine: Min assist   General bed mobility comments: min guard with increased time to EOB, returned to  supine with min assist for LB mgmt   Transfers                 General transfer comment: deferred due to elevated HR with minimal activity     Balance Overall balance assessment: Needs assistance Sitting-balance support: No upper extremity supported;Feet supported Sitting balance-Leahy Scale: Fair                                     ADL either performed or assessed with clinical judgement   ADL Overall ADL's : Needs assistance/impaired     Grooming: Set up;Bed level;Wash/dry face   Upper Body Bathing: Supervision/ safety;Sitting   Lower Body Bathing: Moderate assistance;Sitting/lateral leans   Upper Body Dressing : Set up;Sitting   Lower Body Dressing: Moderate assistance;Sitting/lateral leans     Toilet Transfer Details (indicate cue type and reason): deferred          Functional mobility during ADLs: Min guard (limited to EOB ) General ADL Comments: pt limited by elevated HR with activity, decreased activity tolerance, cognition     Vision Baseline Vision/History: Wears glasses Wears Glasses: Reading only Patient Visual Report: No change from baseline Vision Assessment?: No apparent visual deficits     Perception     Praxis      Pertinent Vitals/Pain Pain Assessment: No/denies pain     Hand Dominance Right   Extremity/Trunk Assessment Upper Extremity Assessment Upper Extremity Assessment: Overall WFL for tasks assessed   Lower Extremity Assessment Lower Extremity Assessment: Defer to PT evaluation  Communication Communication Communication: Other (comment) (slurred speech )   Cognition Arousal/Alertness: Awake/alert Behavior During Therapy: WFL for tasks assessed/performed Overall Cognitive Status: Impaired/Different from baseline Area of Impairment: Orientation;Awareness;Problem solving;Memory                 Orientation Level: Disoriented to;Time   Memory: Decreased short-term memory     Awareness:  Emergent Problem Solving: Slow processing;Difficulty sequencing;Requires verbal cues;Decreased initiation General Comments: pt with decreased orientation to time (date), follows simple commands but requries increased time to process and initate; perseverating on asking therapist to take his IV out    General Comments  HR ranged from 104- 135 while seated EOB, maintained HR at EOB     Exercises     Shoulder Instructions      Home Living Family/patient expects to be discharged to:: Private residence Living Arrangements: Spouse/significant other Available Help at Discharge: Family;Available PRN/intermittently Type of Home: Apartment Home Access: Level entry     Home Layout: One level     Bathroom Shower/Tub: Occupational psychologist: Standard     Home Equipment: None          Prior Functioning/Environment Level of Independence: Independent        Comments: working, drives        OT Problem List: Decreased strength;Impaired balance (sitting and/or standing);Decreased activity tolerance;Decreased safety awareness;Decreased cognition;Decreased knowledge of precautions;Decreased knowledge of use of DME or AE;Cardiopulmonary status limiting activity      OT Treatment/Interventions: Self-care/ADL training;DME and/or AE instruction;Therapeutic activities;Cognitive remediation/compensation;Balance training;Patient/family education;Energy conservation    OT Goals(Current goals can be found in the care plan section) Acute Rehab OT Goals Patient Stated Goal: to get home  OT Goal Formulation: With patient Time For Goal Achievement: 04/27/20 Potential to Achieve Goals: Good  OT Frequency: Min 2X/week   Barriers to D/C:            Co-evaluation              AM-PAC OT "6 Clicks" Daily Activity     Outcome Measure Help from another person eating meals?: A Little Help from another person taking care of personal grooming?: A Little Help from another person  toileting, which includes using toliet, bedpan, or urinal?: A Lot Help from another person bathing (including washing, rinsing, drying)?: A Lot Help from another person to put on and taking off regular upper body clothing?: A Little Help from another person to put on and taking off regular lower body clothing?: A Lot 6 Click Score: 15   End of Session Nurse Communication: Mobility status  Activity Tolerance: Other (comment) (elevated HR ) Patient left: in bed;with call bell/phone within reach;with bed alarm set  OT Visit Diagnosis: Other abnormalities of gait and mobility (R26.89);Muscle weakness (generalized) (M62.81);Other symptoms and signs involving cognitive function                Time: 7654-6503 OT Time Calculation (min): 18 min Charges:  OT General Charges $OT Visit: 1 Visit OT Evaluation $OT Eval Moderate Complexity: 1 Mod  Jolaine Artist, OT Acute Rehabilitation Services Pager [phone] Office [phone]   Delight Stare 04/13/2020, 2:07 PM

## 2020-04-13 NOTE — Progress Notes (Addendum)
Notified MD of tachycardia of 120-130 and bp 126/84, no new orders at this time, will continue with CIWA protocol per MD order.

## 2020-04-13 NOTE — Evaluation (Signed)
Physical Therapy Evaluation Patient Details Name: Stephen Bowers MRN: [id number] DOB: June 19, 1961 Today's Date: 04/13/2020   History of Present Illness  Pt is a 59 y/o male presenting with seizure like activity likely due to alcohol withdrawl.  CIWA protocol. Found with AKI, hypokalemia. EEG negative.  PMH significant for alcohol use disorder, pancreatitis, HTN, CKD, HLD.   Clinical Impression  Pt was assessed after nursing had assisted him to the chair, and was a bit lethargic and sleepy.  He asked to return to bed with PT, but is requiring repetitive and dense cues to sequence and get up to just stand.  Further, he is demonstrating a struggle to move LLE to step on walker to the bed.  Pt is unsafe, and unable to control his standing balance right now.  Recommending rehab to give pt time to recover his balance and restore mobility with safer motor planning and functional use of equipment for his deficits.  Resting HR was 113 and post steps to bed was 143.    Follow Up Recommendations SNF    Equipment Recommendations  None recommended by PT    Recommendations for Other Services       Precautions / Restrictions Precautions Precautions: Fall Precaution Comments: watch HR  Restrictions Weight Bearing Restrictions: No      Mobility  Bed Mobility Overal bed mobility: Needs Assistance Bed Mobility: Sit to Supine     Supine to sit: Min assist Sit to supine: Min assist   General bed mobility comments: min guard with increased time to EOB, returned to supine with min assist for LB mgmt   Transfers Overall transfer level: Needs assistance Equipment used: Rolling walker (2 wheeled);1 person hand held assist Transfers: Sit to/from Stand Sit to Stand: Mod assist         General transfer comment: had elevation of HR to 143 with the effort to return to bed  Ambulation/Gait Ambulation/Gait assistance: Mod assist Gait Distance (Feet): 4 Feet Assistive device: Rolling walker (2  wheeled);1 person hand held assist;2 person hand held assist Gait Pattern/deviations: Step-to pattern;Decreased stride length;Decreased weight shift to left;Shuffle;Wide base of support Gait velocity: reduced   General Gait Details: struggled to move and step with walker, could barely move LLE to get back to bed from chair  Stairs            Wheelchair Mobility    Modified Rankin (Stroke Patients Only)       Balance Overall balance assessment: Needs assistance Sitting-balance support: Feet supported Sitting balance-Leahy Scale: Fair     Standing balance support: Bilateral upper extremity supported;During functional activity Standing balance-Leahy Scale: Poor                               Pertinent Vitals/Pain Pain Assessment: No/denies pain    Home Living Family/patient expects to be discharged to:: Private residence Living Arrangements: Spouse/significant other Available Help at Discharge: Family;Available PRN/intermittently Type of Home: Apartment Home Access: Level entry     Home Layout: One level Home Equipment: None Additional Comments: working delivering mattresses    Prior Function Level of Independence: Independent         Comments: working, drives     Journalist, newspaper   Dominant Hand: Right    Extremity/Trunk Assessment   Upper Extremity Assessment Upper Extremity Assessment: Defer to OT evaluation    Lower Extremity Assessment Lower Extremity Assessment: Overall WFL for tasks assessed    Cervical /  Trunk Assessment Cervical / Trunk Assessment: Normal  Communication   Communication: Other (comment) (slurred speech )  Cognition Arousal/Alertness: Awake/alert Behavior During Therapy: WFL for tasks assessed/performed Overall Cognitive Status: Impaired/Different from baseline Area of Impairment: Orientation;Following commands;Safety/judgement;Problem solving;Awareness;Attention                 Orientation Level:  Time Current Attention Level: Selective Memory: Decreased short-term memory Following Commands: Follows one step commands inconsistently;Follows one step commands with increased time Safety/Judgement: Decreased awareness of safety;Decreased awareness of deficits Awareness: Anticipatory Problem Solving: Slow processing;Decreased initiation;Difficulty sequencing;Requires verbal cues;Requires tactile cues General Comments: pt with decreased orientation to time (date), follows simple commands but requries increased time to process and initate; perseverating on asking therapist to take his IV out       General Comments General comments (skin integrity, edema, etc.): HR was initially 113 in chair but elevated to 143 while returning to bed    Exercises     Assessment/Plan    PT Assessment Patient needs continued PT services  PT Problem List Decreased strength;Decreased range of motion;Decreased activity tolerance;Decreased balance;Decreased mobility;Decreased coordination;Decreased cognition;Decreased knowledge of use of DME;Decreased safety awareness;Decreased knowledge of precautions;Cardiopulmonary status limiting activity       PT Treatment Interventions DME instruction;Gait training;Stair training;Functional mobility training;Therapeutic activities;Therapeutic exercise;Neuromuscular re-education;Balance training;Patient/family education    PT Goals (Current goals can be found in the Care Plan section)  Acute Rehab PT Goals Patient Stated Goal: to get home  PT Goal Formulation: With patient Time For Goal Achievement: 04/27/20 Potential to Achieve Goals: Good    Frequency Min 3X/week   Barriers to discharge Decreased caregiver support home with just wife to assist him    Co-evaluation               AM-PAC PT "6 Clicks" Mobility  Outcome Measure Help needed turning from your back to your side while in a flat bed without using bedrails?: A Little Help needed moving from  lying on your back to sitting on the side of a flat bed without using bedrails?: A Little Help needed moving to and from a bed to a chair (including a wheelchair)?: A Lot Help needed standing up from a chair using your arms (e.g., wheelchair or bedside chair)?: A Lot Help needed to walk in hospital room?: A Lot Help needed climbing 3-5 steps with a railing? : Total 6 Click Score: 13    End of Session Equipment Utilized During Treatment: Gait belt Activity Tolerance: Patient limited by fatigue;Treatment limited secondary to medical complications (Comment) Patient left: in bed;with call bell/phone within reach;with bed alarm set Nurse Communication: Mobility status PT Visit Diagnosis: Unsteadiness on feet (R26.81);Muscle weakness (generalized) (M62.81);Difficulty in walking, not elsewhere classified (R26.2);Adult, failure to thrive (R62.7)    Time: 4650-3546 PT Time Calculation (min) (ACUTE ONLY): 22 min   Charges:   PT Evaluation $PT Eval Moderate Complexity: 1 Mod         Ramond Dial 04/13/2020, 2:44 PM  Mee Hives, PT MS Acute Rehab Dept. Number: [place]

## 2020-04-13 NOTE — Progress Notes (Addendum)
FMTS Attending Daily Note: [name], MD  Team Pager [phone] Pager [phone]  I have seen and examined this patient, reviewed their chart. I have discussed this patient with the resident. I agree with the resident's findings, assessment and care plan.   PT recommending SNF---I suspect he will improve enough to go home.  Has exertional tachycardia. Likely in part related to withdrawal. Monitor, repeat EKG. If persists after withdrawal resolves, consider CTPA.   Of note restraints have been off since 10/11 at 415 PM.   Status is: Inpatient  Remains inpatient appropriate because:IV treatments appropriate due to intensity of illness or inability to take PO   Dispo:  Patient From: Home  Planned Disposition: Home  Expected discharge date: 04/15/20  Medically stable for discharge: No         Family Medicine Teaching Service Daily Progress Note Intern Pager: [phone]  Patient name: Stephen Bowers Medical record number: [id number] Date of birth: Dec 01, 1960 Age: 59 y.o. Gender: male  Primary Care Provider: Ladell Pier, MD Consultants: Neurology Code Status: Full  Pt Overview and Major Events to Date:  Patient admitted 10/10  Assessment and Plan:  [name] a 59 y.o.malepresenting with seizure likely related to alcohol withdrawal. PMH is significant foralcohol use disorder, pancreatitis, HTN, CKD, HLD.  Seizure-like activity likely 2/2 alcohol withdrawal Patient with history of alcohol abuse. No initial EEG per neurology recommendation. Symptoms were most likely related to alcohol withdrawal and no history of seizures. Patient remained largely unresponsive to questioning throughout the day yesterday. Likely d/t Ativan dose required to control withdrawal symptoms. EEG ordered to rule out subclinical seizure. Results pending. Repeat CT head and CXR unremarkable. RUQ ultrasound significant for fatty liver and no biliary pathology. ABG without hypercarbia.  PRN Ativan discontinued d/t somnolence yesterday. Per night team, patient more arousable overnight and expresses interest in going home.  - PRN Ativan per CIWA protocol - PT/OT consult - TOC consult - Continue thiamine 100 mg qd   AKI, resolving Cr 1.97 on admission. Baseline appears to be around 0.8. CK 645. Repeat CMP shows Cr 0.95. - Discontinue LR  - Encourage po  - Continue to monitor clinically - am CMP  Anion gap metabolic acidosis Bicarb at 20 and gap of 19 on admission. Ethanol induced lactic acidosis initially suspected. Lactic acid came back at 1.4. Anion gap at 16 this morning.  - Continue to monitor clinically - am CMP  Hypokalemia K 3.3 on admission, likely 2/2 GI losses and vomiting, and dropped to 3.1 yesterday. Repleted with IV KCl 10 mEq x4. Most recent K 3.4 this morning. - Continue to monitor clinically - am CMP  Hyponatremia, resolving Na 128 on admission. Likely secondary to dehydration with history or vomiting and beer potomania. Last Na 134 this morning - NS bolus given in ED - Discontinued LR - Encourage po - Continue to monitor clinically - am CMP  HTN Home medications include amlodipine 10 mg qd and carvedilol 12.5 mg BID. Wife reports that patient has been noncompliant with medications. BP on admission 136/92. Last BP 164/102. - Restart home amlodipine 10 mg qd  Transaminitis AST/ALT 242/73 on admission, likely alcohol-related given AST:ALT ratio > 2. AST elevated most recently at 259. MELD score 23. Lipase 31. Hepatitis panel negative. RUQ ultrasound significant for fatty liver, but no focal lesions or biliary pathology. - Continue to monitor clinically - am CMP  Thrombocytopenia Platelets 50 on admission, likely secondary to alcohol use.Baseline appears to be 125-150. Platelets  most recently 31, improving.  - am CBC [facility]   Normocytic anemia Hemoglobin 11.7 on admission.Appears to be near his baseline. Suspect component of  myelosuppression from alcohol use. Hgb 11.5 today and unchanged from yesterday. Last colonoscopy in September 2019 with findings of 3 sessile polyps (2 to 5 mm) in the rectum, sigmoid colon, and transverse colon.Repeat in 5 years. Iron 66, TIBC 326, ferritin 165, B12 1087. Will continue to monitor clinically. - am CBC  Alcohol use disorder Per wife, patient drinks alcohol daily (a pint of liquor and a couple of beers every day).  - Thiamine 100 mg qd - Alcohol cessation counseling - Consider starting medication for alcohol cessation such as naltrexone - Consult SW regarding EtOH use  GERD Patient home medications include pantoprazole 40 mg qd. Will continue home medications. - Protonix 40 mg qd  FEN/GI: Heart healthy diet PPx: [facility]   Disposition: Home pending continued clinical improvement.  Subjective:  No acute overnight events. Per night team, patient more arousable, able to answer questions, and participate with exam. Patient evaluated at the bedside this morning. He is respondent and eager to be discharged home. He states that he is feeling much better than he did on admission. He is eager to eat and denies any difficulty swallowing.   Objective: Temp:  [98 F (36.7 C)-99.2 F (37.3 C)] 98 F (36.7 C) (10/12 0308) Pulse Rate:  [94-140] 99 (10/12 0308) Resp:  [12-26] 19 (10/12 0551) BP: (113-173)/(80-117) 164/102 (10/12 0400) SpO2:  [91 %-100 %] 97 % (10/12 0308) Physical Exam: General:  Patient is a pleasant male, lying supine in bed, resting comfortably, in no apparent distress. Cardiovascular:  Normal S1 and S2 with no murmurs, rubs, or gallops. Tachycardic. Regular rhythm. Distal pulses 2+ and symmetrical. Respiratory:  CTA in all fields with no wheezing, rales, or rhonchi. Chest rise is symmetrical with no increased labor of breathing. Abdomen:  Soft, non tender, non distended. No hepatosplenomegaly appreciated. Bowel sounds normoactive. Extremities:  No lower  extremity edema or cyanosis. Neurological: Alert and oriented x4. Normal mentation.   Laboratory: Recent Labs  Lab 04/11/20 1346 04/11/20 1346 04/12/20 0646 04/12/20 1512 04/13/20 0122  WBC 8.2  --  8.7  --  8.0  HGB 11.7*   < > 11.5* 11.9* 11.5*  HCT 33.8*   < > 34.5* 35.0* 33.1*  PLT 50*  --  47*  --  60*   < > = values in this interval not displayed.   Recent Labs  Lab 04/11/20 1346 04/11/20 1346 04/11/20 2017 04/11/20 2017 04/12/20 0646 04/12/20 1512 04/13/20 0122  NA 128*   < > 132*   < > 134* 134* 134*  K 3.3*   < > 3.2*   < > 3.1* 3.5 3.4*  CL 89*   < > 94*  --  95*  --  97*  CO2 20*   < > 19*  --  18*  --  21*  BUN 13   < > 13  --  9  --  6  CREATININE 1.97*   < > 1.27*  --  1.17  --  0.95  CALCIUM 8.6*   < > 8.3*  --  8.7*  --  8.7*  PROT 8.2*  --   --   --  8.1  --  7.8  BILITOT 1.4*  --   --   --  1.8*  --  1.9*  ALKPHOS 39  --   --   --  40  --  43  ALT 73*  --   --   --  80*  --  93*  AST 242*  --   --   --  257*  --  259*  GLUCOSE 144*   < > 100*  --  102*  --  92   < > = values in this interval not displayed.    Imaging/Diagnostic Tests: CT HEAD WO CONTRAST  Result Date: 04/12/2020 CLINICAL DATA:  Altered mental status. EXAM: CT HEAD WITHOUT CONTRAST TECHNIQUE: Contiguous axial images were obtained from the base of the skull through the vertex without intravenous contrast. COMPARISON:  April 11, 2020. FINDINGS: Brain: No evidence of acute infarction, hemorrhage, hydrocephalus, extra-axial collection or mass lesion/mass effect. Vascular: No hyperdense vessel or unexpected calcification. Skull: Normal. Negative for fracture or focal lesion. Sinuses/Orbits: Right maxillary mucous retention cyst is noted. Other: None. IMPRESSION: No acute intracranial abnormality seen. Electronically Signed   By: Marijo Conception M.D.   On: 04/12/2020 16:49   DG CHEST PORT 1 VIEW  Result Date: 04/12/2020 CLINICAL DATA:  59 year old male with altered mental status and  seizure. EXAM: PORTABLE CHEST 1 VIEW COMPARISON:  Chest radiograph dated 12/20/2017 FINDINGS: Mild chronic interstitial coarsening. No focal consolidation, pleural effusion, or pneumothorax. The cardiac silhouette is within limits. No acute osseous pathology. IMPRESSION: No active disease. Electronically Signed   By: Anner Crete M.D.   On: 04/12/2020 15:05   US Abdomen Limited RUQ  Result Date: 04/12/2020 CLINICAL DATA:  Alcohol abuse. EXAM: ULTRASOUND ABDOMEN LIMITED RIGHT UPPER QUADRANT COMPARISON:  Ultrasound abdomen 11/24/2018 FINDINGS: Gallbladder: No gallstones or wall thickening visualized. No sonographic Murphy sign noted by sonographer. Common bile duct: Diameter: 5 mm Liver: Diffusely increased echogenicity of the liver without focal liver lesion. Portal vein is patent on color Doppler imaging with normal direction of blood flow towards the liver. Other: Negative for ascites IMPRESSION: Negative for gallstones. Fatty infiltration of the liver without focal liver lesion. Electronically Signed   By: Franchot Gallo M.D.   On: 04/12/2020 14:56     Cherre Blanc T, Medical Student 04/13/2020, 7:25 AM FPTS Intern pager: [phone], text pages welcome

## 2020-04-14 ENCOUNTER — Inpatient Hospital Stay (HOSPITAL_COMMUNITY): Payer: Self-pay

## 2020-04-14 DIAGNOSIS — R410 Disorientation, unspecified: Secondary | ICD-10-CM

## 2020-04-14 LAB — COMPREHENSIVE METABOLIC PANEL
ALT: 104 U/L — ABNORMAL HIGH (ref 0–44)
AST: 234 U/L — ABNORMAL HIGH (ref 15–41)
Albumin: 3.3 g/dL — ABNORMAL LOW (ref 3.5–5.0)
Alkaline Phosphatase: 38 U/L (ref 38–126)
Anion gap: 16 — ABNORMAL HIGH (ref 5–15)
BUN: 7 mg/dL (ref 6–20)
CO2: 19 mmol/L — ABNORMAL LOW (ref 22–32)
Calcium: 9 mg/dL (ref 8.9–10.3)
Chloride: 96 mmol/L — ABNORMAL LOW (ref 98–111)
Creatinine, Ser: 0.87 mg/dL (ref 0.61–1.24)
GFR, Estimated: >60 mL/min (ref >60)
Glucose, Bld: 148 mg/dL — ABNORMAL HIGH (ref 70–99)
Potassium: 3.4 mmol/L — ABNORMAL LOW (ref 3.5–5.1)
Sodium: 131 mmol/L — ABNORMAL LOW (ref 135–145)
Total Bilirubin: 2.1 mg/dL — ABNORMAL HIGH (ref 0.3–1.2)
Total Protein: 7.5 g/dL (ref 6.5–8.1)

## 2020-04-14 LAB — PHOSPHORUS: Phosphorus: 3.2 mg/dL (ref 2.5–4.6)

## 2020-04-14 LAB — CBC
HCT: 31.5 % — ABNORMAL LOW (ref 39.0–52.0)
Hemoglobin: 11.3 g/dL — ABNORMAL LOW (ref 13.0–17.0)
MCH: 34.2 pg — ABNORMAL HIGH (ref 26.0–34.0)
MCHC: 35.9 g/dL (ref 30.0–36.0)
MCV: 95.5 fL (ref 80.0–100.0)
Platelets: 84 10*3/uL — ABNORMAL LOW (ref 150–400)
RBC: 3.3 MIL/uL — ABNORMAL LOW (ref 4.22–5.81)
RDW: 13.4 % (ref 11.5–15.5)
WBC: 8.8 10*3/uL (ref 4.0–10.5)
nRBC: 0 % (ref 0.0–0.2)

## 2020-04-14 LAB — MAGNESIUM: Magnesium: 1.3 mg/dL — ABNORMAL LOW (ref 1.7–2.4)

## 2020-04-14 MED ORDER — MAGNESIUM SULFATE 4 GM/100ML IV SOLN
4.0000 g | Freq: Once | INTRAVENOUS | Status: AC
  Administered 2020-04-14: 4 g via INTRAVENOUS
  Filled 2020-04-14: qty 100

## 2020-04-14 MED ORDER — LACTATED RINGERS IV SOLN: INTRAVENOUS | Status: DC

## 2020-04-14 MED ORDER — LORAZEPAM 2 MG/ML IJ SOLN
1.0000 mg | Freq: Every day | INTRAMUSCULAR | Status: AC
  Filled 2020-04-14: qty 1

## 2020-04-14 MED ORDER — LORAZEPAM 1 MG PO TABS
1.0000 mg | ORAL_TABLET | Freq: Every day | ORAL | Status: AC
  Administered 2020-04-14 – 2020-04-15 (×2): 1 mg via ORAL
  Filled 2020-04-14 (×2): qty 1

## 2020-04-14 NOTE — Progress Notes (Signed)
Pt increasingly agitated with increased CIWA scores. Pt medicated consistently per [facility] according to CIWA scale. BP and HR elevated d/t increased agitated, but pt remains stable and maintaining airway. Pt answers to name, but when asked his name, pt states, "Nikki." Pt unable to tell me where he currently is or why and speech unintelligible. Charge RN, Francee Nodal aware of pt status. Will continue to monitor pt.

## 2020-04-14 NOTE — Progress Notes (Signed)
CSW attempted to meet with pt.  Pt very drowsy, mumbling, unable to participate in answering questions. Lurline Idol, MSW, LCSW 10/13/202110:34 AM

## 2020-04-14 NOTE — Progress Notes (Addendum)
Family Medicine Teaching Service Daily Progress Note Intern Pager: [phone]  Patient name: Stephen Bowers Medical record number: [id number] Date of birth: 12-08-1960 Age: 59 y.o. Gender: male  Primary Care Provider: Ladell Pier, MD Consultants: Neurology Code Status: Full  Pt Overview and Major Events to Date:  Patient admitted 10/10  Assessment and Plan:  [name] a 59 y.o.malepresenting with seizure likely related to alcohol withdrawal. PMH is significant foralcohol use disorder, pancreatitis, HTN, CKD, HLD.  Seizure-like activity likely 2/2 alcohol withdrawal Per night team, patient became much more agitated overnight. CIWA scores trended 1>11>14>13>20>15>20 through the night. He received 6 doses of PRN ativan per CIWA protocol. Of note, patient has received 16 mg Ativan in past 12 hours. Patient somnolent on exam this morning, soft mittens in place. He is able to mumble in response to questioning, but is not able to respond with discernable answers.  - PRN Ativan per CIWA protocol - Start scheduled Ativan 1 mg qhs to prevent agitation  - PT/OT consult - TOC consult - Continue thiamine 100 mg qd   RUE swelling His right arm appears slightly swollen this morning. No real erythema appreciated. He does withdraw from palpation of the arm and moans during exam. May have injured while agitated. Less likely to be DVT given how agitated/active he has been.  - Right humerus xray - Right shoulder xray - Right wrist xray   AKI, resolving Cr 1.97 on admission. Baseline appears to be around 0.8. CK 645. Repeat CMP shows Cr 0.87 this morning. - Continue to encourage po - Continue to monitor clinically - am CMP  Anion gap metabolic acidosis Bicarb at 20 and gap of 19 on admission. Ethanol induced lactic acidosis initially suspected. Lactic acid came back at 1.4. Anion gap at 16 this morning, unchanged from yesterday. - Continue to monitor clinically - am  CMP  Hypokalemia K 3.3 on admission, likely 2/2 GI losses and vomiting, and dropped to 3.1 on 10/11. Repleted with IV KCl 10 mEq x4. Most recent K 3.4 this morning. - Continue to monitor clinically - am CMP - Mg level today   Hyponatremia, resolving Na 128 on admission. Likely secondary to dehydration with history or vomiting and beer potomania. Last Na 131 corrected to 132 this morning. - Continue to encourage po intake - Continue to monitor clinically - am CMP  HTN Home medications include amlodipine 10 mg qd and carvedilol 12.5 mg BID. Wife reports that patient has been noncompliant with medications. BP on admission 136/92. Last BP 121/82 this morning. - Continue amlodipine 10 mg   Transaminitis AST/ALT 242/73 on admission, likely alcohol-related given AST:ALT ratio > 2.AST/ALT elevated most recently at 234/104. MELD score 23. Lipase 31. Hepatitis panel negative. RUQ ultrasound significant for fatty liver, but no focal lesions or biliary pathology. - Continue to monitor clinically - am CMP  Thrombocytopenia Platelets 50 on admission, likely secondary to alcohol use.Baseline appears to be 125-150.Platelets most recently 57, continuing to improve. - Continue to monitor clinically - am CBC - SQ heparin  Normocytic anemia Hemoglobin 11.7 on admission.Appears to be near his baseline. Suspect component of myelosuppression from alcohol use. Hgb 11.3 today.Last colonoscopy in September 2019 with findings of 3 sessile polyps (2 to 5 mm) in the rectum, sigmoid colon, and transverse colon.Repeat in 5 years.Iron 66, TIBC 326, ferritin 165, B12 1087.  - Will continue to monitor clinically. - am CBC  Alcohol use disorder Per wife, patient drinks alcohol daily (a pint of  liquor and a couple of beers every day). -Thiamine 100 mg qd -Alcohol cessation counseling -Consider starting medication for alcohol cessation such as naltrexone at discharge - Consult SW regarding EtOH  use  GERD Patient home medications include pantoprazole 40 mg qd. Will continue home medications. - Protonix 40 mg qd  FEN/GI: Heart healthy diet PPx: Lovenox  Disposition: Home pending clinical improvement. Patient is not currently medically stable for discharge.  Subjective:  Patient became more agitated overnight. Was given prn Ativan per CIWA protocol. Patient is arousable to questioning this morning, but not able to make intelligible responses. He moans in response to questions. He shakes his head no when asked if he is in any pain. He does moan and withdraw from palpation of his right arm.   Objective: Temp:  [98.6 F (37 C)-99.7 F (37.6 C)] 99.7 F (37.6 C) (10/13 0327) Pulse Rate:  [100-119] 110 (10/13 0327) Resp:  [14-22] 18 (10/13 0348) BP: (121-178)/(80-111) 121/82 (10/13 0327) SpO2:  [97 %-100 %] 100 % (10/13 0327) Physical Exam: General: Patient is a somnolent male, lying supine in bed, in no apparent distress. Cardiovascular: Normal S1 and S2 with no murmurs, rubs, or gallops. Tachycardic, regular rhythm. Distal pulses 2+ and symmetrical. Respiratory: CTA in all fields with no wheezing, rales, or rhonchi. Chest rise is symmetrical with no increased labor of breathing. Abdominal: Soft, non distended, non tender to palpation. No hepatosplenomegaly appreciated. Bowel sounds normoactive. Skin: No active rashes or lesions on exam. Extremities: No LE edema or cyanosis. Right arm with trace swelling. Warm to the touch. No erythema appreciated. Tender to palpation. Soft mittens in place. Neurological: Somnolent on exam. Only able to respond with moans. Is responsive to verbal questions.   Laboratory: Recent Labs  Lab 04/12/20 0646 04/12/20 0646 04/12/20 1512 04/13/20 0122 04/14/20 0524  WBC 8.7  --   --  8.0 8.8  HGB 11.5*   < > 11.9* 11.5* 11.3*  HCT 34.5*   < > 35.0* 33.1* 31.5*  PLT 47*  --   --  60* 84*   < > = values in this interval not displayed.    Recent Labs  Lab 04/12/20 0646 04/12/20 0646 04/12/20 1512 04/13/20 0122 04/14/20 0524  NA 134*   < > 134* 134* 131*  K 3.1*   < > 3.5 3.4* 3.4*  CL 95*  --   --  97* 96*  CO2 18*  --   --  21* 19*  BUN 9  --   --  6 7  CREATININE 1.17  --   --  0.95 0.87  CALCIUM 8.7*  --   --  8.7* 9.0  PROT 8.1  --   --  7.8 7.5  BILITOT 1.8*  --   --  1.9* 2.1*  ALKPHOS 40  --   --  43 38  ALT 80*  --   --  93* 104*  AST 257*  --   --  259* 234*  GLUCOSE 102*  --   --  92 148*   < > = values in this interval not displayed.    Imaging/Diagnostic Tests: EEG adult  Result Date: 04/13/2020 Lora Havens, MD     04/13/2020  9:42 AM Patient Name: JUNO BOZARD MRN: [id number] Epilepsy Attending: Lora Havens Referring Physician/Provider: Dr. Addison Naegeli Date: 04/12/2020 Duration: 23.58 minutes Patient history: 59 year old male with alcohol withdrawal seizures.  EEG to evaluate for seizures. Level of alertness: Awake, asleep AEDs during  EEG study: Ativan Technical aspects: This EEG study was done with scalp electrodes positioned according to the 10-20 International system of electrode placement. Electrical activity was acquired at a sampling rate of 500Hz  and reviewed with a high frequency filter of 70Hz  and a low frequency filter of 1Hz . EEG data were recorded continuously and digitally stored. Description: During awake state, no clear posterior dominant rhythm was seen.  Sleep was characterized by vertex waves, sleep spindles (12 to 14 Hz), maximal frontocentral region.  There is an excessive amount of 15 to 18 Hz beta activity distributed symmetrically and diffusely.  Hyperventilation and photic stimulation were not performed.   ABNORMALITY -Excessive beta, generalized IMPRESSION: This study is within normal limits. No seizures or epileptiform discharges were seen throughout the recording. The excessive beta activity seen in the background is most likely due to the effect of benzodiazepine  and is a benign EEG pattern. Priyanka Thompson Grayer, Medical Student 04/14/2020, 7:29 AM FPTS Intern pager: [phone], text pages welcome  Resident Addendum I have separately seen and examined the patient.  I have discussed the findings and exam with the student and agree with the above note.  I helped develop the management plan that is described in the student's note and I agree with the content.  Changes have been made in BLUE.   I spoke with the patient at around 11:45am.  He was awake but drowsy.  He answer questions appropriately. Was able to follow commands.  He had equal strength in his hands and feet bilaterally.    Addison Naegeli, MD PGY-3 [facility] residency program
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
## 2020-04-15 ENCOUNTER — Inpatient Hospital Stay (HOSPITAL_COMMUNITY): Payer: Self-pay

## 2020-04-15 DIAGNOSIS — F1010 Alcohol abuse, uncomplicated: Secondary | ICD-10-CM

## 2020-04-15 DIAGNOSIS — R509 Fever, unspecified: Secondary | ICD-10-CM

## 2020-04-15 LAB — CBC
HCT: 33.2 % — ABNORMAL LOW (ref 39.0–52.0)
Hemoglobin: 11.7 g/dL — ABNORMAL LOW (ref 13.0–17.0)
MCH: 33.8 pg (ref 26.0–34.0)
MCHC: 35.2 g/dL (ref 30.0–36.0)
MCV: 96 fL (ref 80.0–100.0)
Platelets: 108 10*3/uL — ABNORMAL LOW (ref 150–400)
RBC: 3.46 MIL/uL — ABNORMAL LOW (ref 4.22–5.81)
RDW: 13.8 % (ref 11.5–15.5)
WBC: 8.7 10*3/uL (ref 4.0–10.5)
nRBC: 0 % (ref 0.0–0.2)

## 2020-04-15 LAB — COMPREHENSIVE METABOLIC PANEL
ALT: 83 U/L — ABNORMAL HIGH (ref 0–44)
AST: 126 U/L — ABNORMAL HIGH (ref 15–41)
Albumin: 2.9 g/dL — ABNORMAL LOW (ref 3.5–5.0)
Alkaline Phosphatase: 41 U/L (ref 38–126)
Anion gap: 12 (ref 5–15)
BUN: 7 mg/dL (ref 6–20)
CO2: 22 mmol/L (ref 22–32)
Calcium: 8.8 mg/dL — ABNORMAL LOW (ref 8.9–10.3)
Chloride: 97 mmol/L — ABNORMAL LOW (ref 98–111)
Creatinine, Ser: 0.68 mg/dL (ref 0.61–1.24)
GFR, Estimated: >60 mL/min (ref >60)
Glucose, Bld: 119 mg/dL — ABNORMAL HIGH (ref 70–99)
Potassium: 3.3 mmol/L — ABNORMAL LOW (ref 3.5–5.1)
Sodium: 131 mmol/L — ABNORMAL LOW (ref 135–145)
Total Bilirubin: 1.4 mg/dL — ABNORMAL HIGH (ref 0.3–1.2)
Total Protein: 7.4 g/dL (ref 6.5–8.1)

## 2020-04-15 LAB — MAGNESIUM: Magnesium: 1.9 mg/dL (ref 1.7–2.4)

## 2020-04-15 LAB — URINALYSIS, ROUTINE W REFLEX MICROSCOPIC
Bilirubin Urine: NEGATIVE
Glucose, UA: NEGATIVE mg/dL
Hgb urine dipstick: NEGATIVE
Ketones, ur: NEGATIVE mg/dL
Leukocytes,Ua: NEGATIVE
Nitrite: NEGATIVE
Protein, ur: NEGATIVE mg/dL
Specific Gravity, Urine: 1.006 (ref 1.005–1.030)
pH: 7 (ref 5.0–8.0)

## 2020-04-15 LAB — AMMONIA: Ammonia: 47 umol/L — ABNORMAL HIGH (ref 9–35)

## 2020-04-15 MED ORDER — POTASSIUM CHLORIDE 20 MEQ PO PACK
40.0000 meq | PACK | Freq: Once | ORAL | Status: AC
  Administered 2020-04-15: 40 meq via ORAL
  Filled 2020-04-15: qty 2

## 2020-04-15 NOTE — Progress Notes (Signed)
Physical Therapy Treatment Patient Details Name: Stephen Bowers MRN: [id number] DOB: [birth year] Today's Date: 04/15/2020    History of Present Illness Pt is a 59 y/o male presenting with seizure like activity likely due to alcohol withdrawl.  CIWA protocol. Found with AKI, hypokalemia. EEG negative.  PMH significant for alcohol use disorder, pancreatitis, HTN, CKD, HLD.     PT Comments    Pt was seen to re-attempt gait on RW, and with two person assist could get him up and walking only a short trip safely.  Pt is lethargic and confused, and will anticipate for the near future will continue to be safer with two person help for transfers and gait.  He is still unable to advance LLE well, and has major losses of balance with standing on walker:  moments of loss of focus with leaning to the side have occurred twice today.  Will continue to work on safety and increased standing control.  Follow Up Recommendations  SNF     Equipment Recommendations  None recommended by PT    Recommendations for Other Services       Precautions / Restrictions Precautions Precautions: Fall Precaution Comments: watch HR  Restrictions Weight Bearing Restrictions: No    Mobility  Bed Mobility Overal bed mobility: Needs Assistance Bed Mobility: Supine to Sit     Supine to sit: Mod assist     General bed mobility comments: mod assist due to lethargic presentation  Transfers Overall transfer level: Needs assistance Equipment used: Rolling walker (2 wheeled) Transfers: Sit to/from [facility] Sit to Stand: Mod assist;+2 physical assistance;+2 safety/equipment Stand pivot transfers: Mod assist;+2 physical assistance;+2 safety/equipment       General transfer comment: pt is assisted to recliner with RW but is unsafe, stops and begins a slow lean each time PT tries to get him to change walking direction  Ambulation/Gait Ambulation/Gait assistance: Mod assist;+2 physical  assistance;+2 safety/equipment Gait Distance (Feet): 5 Feet Assistive device: Rolling walker (2 wheeled);1 person hand held assist;2 person hand held assist Gait Pattern/deviations: Step-to pattern;Step-through pattern;Decreased stride length;Wide base of support;Trunk flexed;Shuffle Gait velocity: reduced Gait velocity interpretation: <1.31 ft/sec, indicative of household ambulator General Gait Details: continues to struggle to move LLE backward or forward, unsafe with walker even with help.  Requires a second person for moments when he is drifting off balance and not attending to cues   Stairs             Wheelchair Mobility    Modified Rankin (Stroke Patients Only)       Balance Overall balance assessment: Needs assistance Sitting-balance support: Feet supported Sitting balance-Leahy Scale: Fair     Standing balance support: Bilateral upper extremity supported;During functional activity Standing balance-Leahy Scale: Poor                              Cognition Arousal/Alertness: Lethargic Behavior During Therapy: Flat affect Overall Cognitive Status: Impaired/Different from baseline Area of Impairment: Following commands;Safety/judgement;Awareness;Problem solving;Attention                 Orientation Level: Situation;Time Current Attention Level: Selective Memory: Decreased short-term memory;Decreased recall of precautions Following Commands: Follows one step commands inconsistently;Follows one step commands with increased time Safety/Judgement: Decreased awareness of deficits;Decreased awareness of safety Awareness: Anticipatory Problem Solving: Slow processing;Requires verbal cues;Requires tactile cues;Decreased initiation General Comments: struggles to follow through with LLE to step and shift, directionally moves very slowly in ersponse to  requests      Exercises      General Comments General comments (skin integrity, edema, etc.): HR and  sats were WFL initially then drops to 94% end of session      Pertinent Vitals/Pain Pain Assessment: No/denies pain    Home Living Family/patient expects to be discharged to:: Private residence Living Arrangements: Spouse/significant other Available Help at Discharge: Family;Available PRN/intermittently Type of Home: Apartment              Prior Function            PT Goals (current goals can now be found in the care plan section) Acute Rehab PT Goals Patient Stated Goal: to get home  Progress towards PT goals: Not progressing toward goals - comment    Frequency    Min 3X/week      PT Plan Current plan remains appropriate    Co-evaluation   Reason for Co-Treatment: Necessary to address cognition/behavior during functional activity;For patient/therapist safety;To address functional/ADL transfers   OT goals addressed during session: ADL's and self-care      AM-PAC PT "6 Clicks" Mobility   Outcome Measure  Help needed turning from your back to your side while in a flat bed without using bedrails?: A Lot Help needed moving from lying on your back to sitting on the side of a flat bed without using bedrails?: A Lot Help needed moving to and from a bed to a chair (including a wheelchair)?: A Lot Help needed standing up from a chair using your arms (e.g., wheelchair or bedside chair)?: A Lot Help needed to walk in hospital room?: A Lot Help needed climbing 3-5 steps with a railing? : Total 6 Click Score: 11    End of Session Equipment Utilized During Treatment: Gait belt Activity Tolerance: Patient limited by fatigue;Treatment limited secondary to medical complications (Comment);Patient limited by lethargy Patient left: in chair;with call bell/phone within reach;with chair alarm set Nurse Communication: Mobility status;Other (comment) (needs two for return to bed) PT Visit Diagnosis: Unsteadiness on feet (R26.81);Muscle weakness (generalized) (M62.81);Difficulty in  walking, not elsewhere classified (R26.2);Adult, failure to thrive (R62.7)     Time: 1200-1227 PT Time Calculation (min) (ACUTE ONLY): 27 min  Charges:  $Gait Training: 8-22 mins                   Ramond Dial 04/15/2020, 4:22 PM  Mee Hives, PT MS Acute Rehab Dept. Number: [place]

## 2020-04-15 NOTE — Progress Notes (Signed)
FPTS Interim Progress Note  Called patient's wife and informed her that he has been improving throughout the day. His right arm pain has resolved and his IV has been switched to the left arm. Informed of his fever overnight and that CXR normal and blood/urine cultures pending. She wanted to know about when he would be stable for discharge and where he would need to be discharged to. Informed her that PT/OT were still recommending SNF. Will re-evaluate tomorrow. She states that she will be in the hospital to visit tomorrow morning.

## 2020-04-15 NOTE — Plan of Care (Signed)

## 2020-04-15 NOTE — TOC CAGE-AID Note (Signed)
Transition of Care [facility]) - CAGE-AID Screening   Patient Details  Name: Stephen Bowers MRN: [id number] Date of Birth: 1961/05/11  [facility] CM/SW Contact:    Joanne Chars, LCSW Phone Number: 04/15/2020, 12:13 PM   Clinical Narrative: see CSW initial assessment.     CAGE-AID Screening:    Have You Ever Felt You Ought to Cut Down on Your Drinking or Drug Use?: Yes Have People Annoyed You By Critizing Your Drinking Or Drug Use?: Yes Have You Felt Bad Or Guilty About Your Drinking Or Drug Use?: Yes Have You Ever Had a Drink or Used Drugs First Thing In The Morning to Steady Your Nerves or to Get Rid of a Hangover?: Yes CAGE-AID Score: 4  Substance Abuse Education Offered: Yes  Substance abuse interventions: Other (must comment) (pt still not cleared medically)

## 2020-04-15 NOTE — Progress Notes (Addendum)
Family Medicine Teaching Service Daily Progress Note Intern Pager: [phone]  Patient name: Stephen Bowers Medical record number: [id number] Date of birth: 1960-08-29 Age: 59 y.o. Gender: male  Primary Care Provider: Ladell Pier, MD Consultants: Neurology Code Status: Full  Pt Overview and Major Events to Date:  Patient admitted 10/10  Assessment and Plan:  [name] a 59 y.o.malepresenting withseizure likely related to alcohol withdrawal. PMH is significant foralcohol use disorder, pancreatitis, HTN, CKD, HLD.  Seizure-like activity likely 2/2 alcohol withdrawal Nursing noted previous visual hallucinations in the evening. CIWA scores trended 3>0>1 overnight. Patient received 1 mg Ativan in the past 12 hours. Patient continues to be somnolent on exam this morning. Unable to participate in full neurological exam and only able to respond intelligibly to yes and no questions. Will reassess this afternoon. - decrease IV LR  To 85 mL/hr - PRN Ativan per CIWA protocol - Continue scheduled Ativan 1 mg  - PT/OT consult late in admission - TOC consult  Fever of unknown origin Fever began last night. Temps trended 100.1, 99.9, 100.6 overnight.  - CXR pending - UA, urine culture pending - Blood culture pending  RUE pain All RUE imaging negative for acute fractures. Per night team, patient continued to endorse RUE pain overnight. He stated that "it hurts where the bullet is" and pointed to his IV site. Patient denies any pain in the right arm today.  - IV transferred to left arm  AKI, resolved Cr 1.97 on admission. Baseline appears to be around 0.8. CK 645. Repeat CMP shows Cr 0.68 this morning. - Continue to monitor clinically - am CMP  Anion gap metabolic acidosis, resolved Bicarb at 20 and gap of 19 on admission. Ethanol induced lactic acidosis initially suspected. Lactic acid came back at 1.4. Anion gap at12this morning. - Continue to monitorclinically - am  CMP  Hypokalemia K 3.3 on admission, likely 2/2 GI losses and vomiting, and dropped to 3.1 on 10/11. Repleted with IV KCl 10 mEq x4. Most recent K 3.3this morning. - Replete with IV KCl 10 mEq x4 -Continue to monitor clinically - am CMP - Mg level today   Hyponatremia Na 128 on admission. Likely secondary to dehydration with history or vomiting and beer potomania. Last Na131 this morning. - Continue to encourage po intake - Continue to monitorclinically - am CMP  HTN Home medications include amlodipine 10 mg qd and carvedilol 12.5 mg BID. Wife reports that patient has been noncompliantwith medications. BP on admission 136/92. Last BP 143/97 this morning. - Continue amlodipine 10 mg   Transaminitis, improving AST/ALT 242/73 on admission, likely alcohol-related given AST:ALT ratio > 2.AST/ALT elevated most recently at 126/83. MELD score 23. Lipase 31. Hepatitis panel negative.RUQ ultrasound significant for fatty liver, but no focal lesions or biliary pathology. - Continue to monitor clinically - am CMP  Thrombocytopenia, improving Platelets 50 on admission, likely secondary to alcohol use.Baseline appears to be 125-150.Platelets most recently108, continuing to improve. - Continue to monitor clinically - am CBC - SQ heparin  Normocytic anemia Hemoglobin 11.7 on admission.Appears to be near his baseline. Suspect component of myelosuppression from alcohol use. Hgb 11.7today.Last colonoscopy in September 2019 with findings of 3 sessile polyps (2 to 5 mm) in the rectum, sigmoid colon, and transverse colon.Repeat in 5 years.Iron 66, TIBC 326, ferritin 165, B12 1087.  - Will continue to monitor clinically. - am CBC  Alcohol use disorder Per wife, patient drinks alcohol daily (a pint of liquor and a couple  of beers every day). -Thiamine 100 mg qd -Alcohol cessation counseling -Consider starting medication for alcohol cessation such as naltrexone at discharge -  Consult SW regarding EtOH use  GERD Patient home medications include pantoprazole 40 mg qd. Will continue home medications. - Protonix 40 mg qd  FEN/GI: Heart healthy diet PPx: Lovenox  Disposition: Med-Tele  Subjective:  No acute overnight events. Per night team, patient was still experiencing RUE pain. IV was moved to left arm. Patient denied any pain in the right arm this morning. Patient remains somnolent on exam, able to answer yes and no questions with mumbled responses, but not able to complete sentences intelligibly.  Objective: Temp:  [98 F (36.7 C)-100.1 F (37.8 C)] 99.9 F (37.7 C) (10/14 0414) Pulse Rate:  [98-114] 100 (10/14 0309) Resp:  [17-20] 17 (10/14 0309) BP: (126-187)/(79-102) 144/92 (10/14 0414) SpO2:  [97 %-100 %] 97 % (10/14 0309) Physical Exam: General: Patient is a somnolent male, lying supine in bed, in no apparent distress. Cardiovascular: Normal S1 and S2 with no murmurs, rubs, or gallops. RRR. Distal pulses 2+ and symmetrical. Respiratory: CTA in all fields with no wheezing, rales, or rhonchi. Chest rise is symmetrical with no increased labor of breathing. Abdominal: Soft, non distended, non tender to palpation. No hepatosplenomegaly appreciated. Bowel sounds normoactive. Skin: No active rashes or lesions on exam. Extremities: No peripheral edema or cyanosis.  Neurological: Somnolent on exam. Able to respond to yes and no questions with mumbles. Not able to respond in full sentences. Unable to participate in full exam.   Laboratory: Recent Labs  Lab 04/13/20 0122 04/14/20 0524 04/15/20 0328  WBC 8.0 8.8 8.7  HGB 11.5* 11.3* 11.7*  HCT 33.1* 31.5* 33.2*  PLT 60* 84* 108*   Recent Labs  Lab 04/13/20 0122 04/14/20 0524 04/15/20 0328  NA 134* 131* 131*  K 3.4* 3.4* 3.3*  CL 97* 96* 97*  CO2 21* 19* 22  BUN 6 7 7   CREATININE 0.95 0.87 0.68  CALCIUM 8.7* 9.0 8.8*  PROT 7.8 7.5 7.4  BILITOT 1.9* 2.1* 1.4*  ALKPHOS 43 38 41   ALT 93* 104* 83*  AST 259* 234* 126*  GLUCOSE 92 148* 119*    Imaging/Diagnostic Tests: DG Shoulder Right  Result Date: 04/14/2020 CLINICAL DATA:  Right upper extremity edema.  Pain.  No known injury EXAM: RIGHT SHOULDER - 2+ VIEW COMPARISON:  None. FINDINGS: No acute bony abnormality. Specifically, no fracture, subluxation, or dislocation. Slightly high-riding appearance of the right humeral head could reflect some underlying rotator cuff insufficiency or possibly related to nonstandard projection given patient difficulty with positioning. Mild glenohumeral and acromioclavicular arthrosis. Mild edematous changes in the soft tissues. Included portion of the right chest is unremarkable. IMPRESSION: 1. No acute osseous abnormality. 2. Mildly high-riding appearance of the right humeral head could reflect some underlying rotator cuff insufficiency or possibly projectional given patient difficulty with positioning. Electronically Signed   By: Lovena Le M.D.   On: 04/14/2020 15:55   DG Wrist Complete Right  Result Date: 04/14/2020 CLINICAL DATA:  Upper extremity swelling, no injury EXAM: RIGHT WRIST - COMPLETE 3+ VIEW COMPARISON:  None. FINDINGS: No acute bony abnormality. Specifically, no fracture, subluxation, or dislocation. Minimal degenerative arthrosis without features of an underlying arthropathy. Remote posttraumatic deformity of the fifth metacarpal. Soft tissues are unremarkable. IMPRESSION: 1. No acute osseous abnormality. 2. Minimal degenerative arthrosis without features of an underlying arthropathy. 3. Remote posttraumatic deformity of the fifth metacarpal. Electronically Signed   By:  Lovena Le M.D.   On: 04/14/2020 15:51   DG Humerus Right  Result Date: 04/14/2020 CLINICAL DATA:  Right upper extremity edema, no known injury EXAM: RIGHT HUMERUS - 2+ VIEW COMPARISON:  None. FINDINGS: Diffuse mild edematous changes of the soft tissues. Humerus is intact. No acute bony abnormality.  Specifically, no fracture, subluxation, or dislocation. IV cannula seen in the medial to the elbow with focal thickening and small focus of soft tissue gas near the insertion site. IMPRESSION: 1. No acute osseous abnormality. 2. Diffuse mild edematous changes of the soft tissues. 3. IV cannula seen in the medial antecubital fossa with focal thickening and small focus of soft tissue gas near the insertion site. Correlate with visual inspection. Electronically Signed   By: Lovena Le M.D.   On: 04/14/2020 15:50     Cherre Blanc T, Medical Student 04/15/2020, 7:30 AM FPTS Intern pager: [phone], text pages welcome  Resident Addendum I have separately seen and examined the patient.  I have discussed the findings and exam with the student and agree with the above note.  I helped develop the management plan that is described in the student's note and I agree with the content.   I saw the patient after noon and he was sitting up in a chair, able to carry a conversation. He was much improved from yesterday.  He was tachycardic, but not symptomatic.  Will continue to monitor throughout the day. He is still recuperating from his severe withdrawal.    Addison Naegeli, MD PGY-3 [facility] FM residency program

## 2020-04-15 NOTE — TOC Initial Note (Signed)
Transition of Care [facility]) - Initial/Assessment Note    Patient Details  Name: Stephen Bowers MRN: [id number] Date of Birth: 1961-03-20  Transition of [facility]) CM/SW Contact:    Joanne Chars, LCSW Phone Number: 04/15/2020, 12:08 PM  Clinical Narrative: CSW met with pt to discuss discharge plan and referral for ETOH use.  Permission given by pt to speak to wife, Malachy Mood. Pt is uninsured, seen at [place], reports he has the orange card but does not recall them talking about medicaid.  Pt agreeable to going to SNF, discussed this will be complicated by self pay status.  Pt is not vaccinated.  Discussed ETOH use.  Pt reports he drinks three days per week, usually 5-6 drinks per day, beer and liquor drinks.  Pt has not been to substance abuse treatment before.  CSW completed cage aid screening and discussed whether pt would be open to ETOH treatment at some point.  Pt to think about this.  Choice document provided for SNF.  CSW spoke with pt wife Malachy Mood regarding SNF recommendation.  She confirmed that [place] provided orange card but she has not heard them talking about medicaid.  She reports pt is daily drinker.  SHe could not give an amount used each day but said it is "a lot."  She will encourage pt in regards to seeking treatment as well.                      Expected Discharge Plan: Skilled Nursing Facility Barriers to Discharge: Continued Medical Work up, SNF Pending payor source - LOG, SNF Pending bed offer   Patient Goals and CMS Choice Patient states their goals for this hospitalization and ongoing recovery are:: "get back to regular life" CMS Medicare.gov Compare Post Acute Care list provided to:: Patient Choice offered to / list presented to : Patient  Expected Discharge Plan and Services Expected Discharge Plan: [place] Choice: [place] Living arrangements for the past 2  months: Apartment                                      Prior Living Arrangements/Services Living arrangements for the past 2 months: Apartment Lives with:: Spouse Patient language and need for interpreter reviewed:: Yes Do you feel safe going back to the place where you live?: Yes      Need for Family Participation in Patient Care: Yes (Comment) Care giver support system in place?: Yes (comment)   Criminal Activity/Legal Involvement Pertinent to Current Situation/Hospitalization: No - Comment as needed  Activities of Daily Living Home Assistive Devices/Equipment: None ADL Screening (condition at time of admission) Patient's cognitive ability adequate to safely complete daily activities?: Yes Is the patient deaf or have difficulty hearing?: No Does the patient have difficulty seeing, even when wearing glasses/contacts?: No Does the patient have difficulty concentrating, remembering, or making decisions?: No Patient able to express need for assistance with ADLs?: Yes Does the patient have difficulty dressing or bathing?: No Independently performs ADLs?: Yes (appropriate for developmental age) Does the patient have difficulty walking or climbing stairs?: No Weakness of Legs: None Weakness of Arms/Hands: None  Permission Sought/Granted Permission sought to share information with : Facility Sport and exercise psychologist, Family Supports Permission granted to share information with : Yes, Verbal Permission Granted  Share Information with NAME: wife  Malachy Mood  Permission granted to share info w AGENCY: SNF        Emotional Assessment Appearance:: Appears older than stated age Attitude/Demeanor/Rapport: Lethargic Affect (typically observed): Quiet Orientation: : Oriented to Self, Oriented to Place, Oriented to Situation Alcohol / Substance Use: Alcohol Use Psych Involvement: No (comment)  Admission diagnosis:  Alcohol abuse [F10.10] Hyponatremia [E87.1] Alcohol withdrawal seizure  ([place]) [G64.403, R56.9] AKI (acute kidney injury) ([place]) [N17.9] Seizure-like activity ([place]) [R56.9] AMS (altered mental status) [R41.82] Patient Active Problem List   Diagnosis Date Noted  . AMS (altered mental status)   . Thrombocytopenia ([place])   . Seizure-like activity ([place]) 04/11/2020  . Alcohol withdrawal seizure ([place]) 04/11/2020  . Alcohol use disorder, mild, abuse 02/24/2019  . Alcoholic fatty liver 47/42/5956  . Prediabetes 02/24/2019  . Normocytic anemia 02/24/2019  . Acute pancreatitis 12/20/2017  . Increased anion gap metabolic acidosis   . Severe sepsis ([place])   . Abnormal LFTs 12/04/2017  . Pancreatitis 12/04/2017  . Hyperlipidemia 10/16/2016  . AKI (acute kidney injury) ([place])   . HTN (hypertension) 03/02/2015  . Vitamin D deficiency 01/19/2015  . Low back pain potentially associated with radiculopathy 03/30/2014   PCP:  Ladell Pier, MD Pharmacy:   [facility], [place] [address] [place] [address] Phone: [phone] Fax: [phone]     Social Determinants of Health (SDOH) Interventions    Readmission Risk Interventions No flowsheet data found.

## 2020-04-15 NOTE — Progress Notes (Signed)
Occupational Therapy Treatment Patient Details Name: Stephen Bowers MRN: [id number] DOB: 10/07/1960 Today's Date: 04/15/2020    History of present illness Pt is a 59 y/o male presenting with seizure like activity likely due to alcohol withdrawl.  CIWA protocol. Found with AKI, hypokalemia. EEG negative.  PMH significant for alcohol use disorder, pancreatitis, HTN, CKD, HLD.    OT comments  Pt progressing towards acute OT goals. Still needing +2 assist to stand-pivot and unable to progress gait further this session. Pt lethargic in bed at start of session. More alert once sitting EOB. HR up to 147 with minimal activity (standing). Pt up in recliner at end of session with chair alarm on. Updated OT recommendation to SNF for rehab prior to returning home.    Follow Up Recommendations  SNF    Equipment Recommendations  Other (comment) (defer to next venue)    Recommendations for Other Services      Precautions / Restrictions Precautions Precautions: Fall Precaution Comments: watch HR  Restrictions Weight Bearing Restrictions: No       Mobility Bed Mobility Overal bed mobility: Needs Assistance Bed Mobility: Supine to Sit     Supine to sit: Min assist     General bed mobility comments: min A to steady and initiate advancing BLE. Pt received supine in bed, lethargic.   Transfers Overall transfer level: Needs assistance Equipment used: Rolling walker (2 wheeled) Transfers: Sit to/from [facility] Sit to Stand: Mod assist Stand pivot transfers: Mod assist;+2 safety/equipment;+2 physical assistance       General transfer comment: EOB to recliner. assist to steady, powerup and control descent. Sequencing cues needed.     Balance Overall balance assessment: Needs assistance Sitting-balance support: Feet supported Sitting balance-Leahy Scale: Fair     Standing balance support: Bilateral upper extremity supported;During functional activity Standing  balance-Leahy Scale: Poor                             ADL either performed or assessed with clinical judgement   ADL Overall ADL's : Needs assistance/impaired                         Toilet Transfer: Moderate assistance;+2 for physical assistance;+2 for safety/equipment;Stand-pivot;BSC;RW Toilet Transfer Details (indicate cue type and reason): simulated with EOB to recliner. extra time and cueing for sequencing and safety           General ADL Comments: HR up tp 147 in standing     Vision       Perception     Praxis      Cognition Arousal/Alertness: Awake/alert;Lethargic (more alert once EOB) Behavior During Therapy: Flat affect;WFL for tasks assessed/performed Overall Cognitive Status: Impaired/Different from baseline Area of Impairment: Following commands;Safety/judgement;Awareness;Problem solving;Attention                   Current Attention Level: Selective Memory: Decreased recall of precautions;Decreased short-term memory Following Commands: Follows one step commands inconsistently;Follows one step commands with increased time Safety/Judgement: Decreased awareness of safety;Decreased awareness of deficits Awareness: Emergent Problem Solving: Slow processing;Decreased initiation;Difficulty sequencing;Requires verbal cues;Requires tactile cues General Comments: slow processing, difficulty follow multistep commands, difficulty sequencing        Exercises     Shoulder Instructions       General Comments      Pertinent Vitals/ Pain       Pain Assessment: No/denies pain  Home Living  Prior Functioning/Environment              Frequency  Min 2X/week        Progress Toward Goals  OT Goals(current goals can now be found in the care plan section)  Progress towards OT goals: Progressing toward goals  Acute Rehab OT Goals Patient Stated Goal: to get home  OT  Goal Formulation: With patient Time For Goal Achievement: 04/27/20 Potential to Achieve Goals: Good ADL Goals Pt Will Perform Grooming: standing;with supervision Pt Will Perform Lower Body Dressing: with supervision;sit to/from stand Pt Will Transfer to Toilet: with supervision;ambulating;stand pivot transfer Pt Will Perform Tub/Shower Transfer: with supervision;Shower transfer;ambulating;3 in 1 Additional ADL Goal #1: Patient will demosntrate anticipatory awareness during ADL routine to optimize safety.  Plan Discharge plan needs to be updated    Co-evaluation    PT/OT/SLP Co-Evaluation/Treatment: Yes Reason for Co-Treatment: Necessary to address cognition/behavior during functional activity;For patient/therapist safety;To address functional/ADL transfers   OT goals addressed during session: ADL's and self-care      AM-PAC OT "6 Clicks" Daily Activity     Outcome Measure   Help from another person eating meals?: A Little Help from another person taking care of personal grooming?: A Little Help from another person toileting, which includes using toliet, bedpan, or urinal?: A Lot Help from another person bathing (including washing, rinsing, drying)?: A Lot Help from another person to put on and taking off regular upper body clothing?: A Little Help from another person to put on and taking off regular lower body clothing?: A Lot 6 Click Score: 15    End of Session Equipment Utilized During Treatment: Rolling walker;Gait belt  OT Visit Diagnosis: Other abnormalities of gait and mobility (R26.89);Muscle weakness (generalized) (M62.81);Other symptoms and signs involving cognitive function   Activity Tolerance Other (comment) (elevated HR with minimal activity)   Patient Left in chair;with call bell/phone within reach;with chair alarm set   Nurse Communication          Time: [phone] OT Time Calculation (min): 30 min  Charges: OT General Charges $OT Visit: 1 Visit OT  Treatments $Self Care/Home Management : 8-22 mins  Tyrone Schimke, OT Acute Rehabilitation Services Pager: [phone] Office: [phone]    Hortencia Pilar 04/15/2020, 1:43 PM

## 2020-04-16 ENCOUNTER — Encounter (HOSPITAL_COMMUNITY): Payer: Self-pay

## 2020-04-16 ENCOUNTER — Inpatient Hospital Stay (HOSPITAL_COMMUNITY): Payer: Self-pay

## 2020-04-16 DIAGNOSIS — M25521 Pain in right elbow: Secondary | ICD-10-CM

## 2020-04-16 DIAGNOSIS — E871 Hypo-osmolality and hyponatremia: Secondary | ICD-10-CM

## 2020-04-16 LAB — COMPREHENSIVE METABOLIC PANEL
ALT: 69 U/L — ABNORMAL HIGH (ref 0–44)
AST: 95 U/L — ABNORMAL HIGH (ref 15–41)
Albumin: 2.9 g/dL — ABNORMAL LOW (ref 3.5–5.0)
Alkaline Phosphatase: 39 U/L (ref 38–126)
Anion gap: 10 (ref 5–15)
BUN: 5 mg/dL — ABNORMAL LOW (ref 6–20)
CO2: 23 mmol/L (ref 22–32)
Calcium: 9 mg/dL (ref 8.9–10.3)
Chloride: 98 mmol/L (ref 98–111)
Creatinine, Ser: 0.65 mg/dL (ref 0.61–1.24)
GFR, Estimated: >60 mL/min (ref >60)
Glucose, Bld: 118 mg/dL — ABNORMAL HIGH (ref 70–99)
Potassium: 3.6 mmol/L (ref 3.5–5.1)
Sodium: 131 mmol/L — ABNORMAL LOW (ref 135–145)
Total Bilirubin: 1 mg/dL (ref 0.3–1.2)
Total Protein: 7.5 g/dL (ref 6.5–8.1)

## 2020-04-16 LAB — CBC
HCT: 32.5 % — ABNORMAL LOW (ref 39.0–52.0)
Hemoglobin: 11.5 g/dL — ABNORMAL LOW (ref 13.0–17.0)
MCH: 33.7 pg (ref 26.0–34.0)
MCHC: 35.4 g/dL (ref 30.0–36.0)
MCV: 95.3 fL (ref 80.0–100.0)
Platelets: 142 10*3/uL — ABNORMAL LOW (ref 150–400)
RBC: 3.41 MIL/uL — ABNORMAL LOW (ref 4.22–5.81)
RDW: 13.3 % (ref 11.5–15.5)
WBC: 10 10*3/uL (ref 4.0–10.5)
nRBC: 0 % (ref 0.0–0.2)

## 2020-04-16 LAB — SYNOVIAL CELL COUNT + DIFF, W/ CRYSTALS
Eosinophils-Synovial: 0 % (ref 0–1)
Lymphocytes-Synovial Fld: 1 % (ref 0–20)
Monocyte-Macrophage-Synovial Fluid: 1 % — ABNORMAL LOW (ref 50–90)
Neutrophil, Synovial: 98 % — ABNORMAL HIGH (ref 0–25)
WBC, Synovial: 16375 /mm3 — ABNORMAL HIGH (ref 0–200)

## 2020-04-16 LAB — RESPIRATORY PANEL BY RT PCR (FLU A&B, COVID)
Influenza A by PCR: NEGATIVE
Influenza B by PCR: NEGATIVE
SARS Coronavirus 2 by RT PCR: NEGATIVE

## 2020-04-16 LAB — URIC ACID: Uric Acid, Serum: 5 mg/dL (ref 3.7–8.6)

## 2020-04-16 MED ORDER — LACTULOSE 10 GM/15ML PO SOLN
10.0000 g | Freq: Three times a day (TID) | ORAL | Status: DC
  Administered 2020-04-16 – 2020-04-17 (×4): 10 g via ORAL
  Filled 2020-04-16 (×3): qty 15

## 2020-04-16 MED ORDER — LORAZEPAM 2 MG/ML IJ SOLN: 1.0000 mg | INTRAMUSCULAR | Status: DC | PRN

## 2020-04-16 MED ORDER — CARVEDILOL 6.25 MG PO TABS
6.2500 mg | ORAL_TABLET | Freq: Two times a day (BID) | ORAL | Status: DC
  Administered 2020-04-16 – 2020-04-17 (×4): 6.25 mg via ORAL
  Filled 2020-04-16 (×3): qty 1

## 2020-04-16 MED ORDER — BUPIVACAINE HCL (PF) 0.5 % IJ SOLN
10.0000 mL | Freq: Once | INTRAMUSCULAR | Status: AC
  Administered 2020-04-16: 10 mL
  Filled 2020-04-16 (×2): qty 10

## 2020-04-16 MED ORDER — LACTATED RINGERS IV SOLN: INTRAVENOUS | Status: DC

## 2020-04-16 MED ORDER — LORAZEPAM 1 MG PO TABS
1.0000 mg | ORAL_TABLET | ORAL | Status: DC | PRN
  Administered 2020-04-17: 1 mg via ORAL
  Filled 2020-04-16: qty 1

## 2020-04-16 NOTE — Progress Notes (Signed)
Spoke with on call MD(MD also at bedside) concerning pts temp. Pt given tylenol 650@ 0601. Temp rechecked @0627  and is still elevated. Will update next shift to make daytime MD aware if fever doesn't break.

## 2020-04-16 NOTE — Procedures (Signed)
Procedure: Right elbow aspiration and injection  Indication: Right elbow effusion(s)  Surgeon: Silvestre Gunner, PA-C  Assist: None  Anesthesia: 46ml 0.5% Marcaine and topical refrigerant  EBL: None  Complications: None  Findings: After risks/benefits explained patient desires to undergo procedure. Consent obtained and time out performed. The right elbow was sterilely prepped and aspirated. First attempt failed to find joint. Area infiltrated with Marcaine, area reprepped, and 2nd attempt successful. 74ml yellow turbid fluid aspirated. 36ml 0.5% Marcaine instilled. Pt tolerated the procedure well.    Lisette Abu, PA-C Orthopedic Surgery [phone]

## 2020-04-16 NOTE — Consult Note (Signed)
Reason for Consult:Right elbow pain Referring Physician: Ching Bowers is an 59 y.o. male.  HPI: Stephen Bowers was admitted 2/2 withdrawal seizure on 10/10. He's run fevers over the last 48h without obvious source. He has been starting to c/o right elbow pain. He denies hx/o gout or prior e/o. He is RHD.  Past Medical History:  Diagnosis Date  . Chronic musculoskeletal pain   . CKD (chronic kidney disease)    follows with [facility]ie [facility]alk)  . High cholesterol Dx 2011  . Hypertension   . Pancolitis ([place])   . Wears partial dentures    upper only    Past Surgical History:  Procedure Laterality Date  . COLONOSCOPY  12/2014   hx polyps - Pyrtle  . MULTIPLE TOOTH EXTRACTIONS      Family History  Problem Relation Age of Onset  . Liver disease Father   . Alcohol abuse Father   . Diabetes Sister   . Obesity Sister   . Cancer Mother        breast   . Kidney disease Mother   . Colon cancer Neg Hx   . Esophageal cancer Neg Hx   . Rectal cancer Neg Hx   . Stomach cancer Neg Hx   . Colon polyps Neg Hx     Social History:  reports that he quit smoking about 26 years ago. His smoking use included cigarettes. He has a 1.50 pack-year smoking history. He has never used smokeless tobacco. He reports current alcohol use of about 6.0 standard drinks of alcohol per week. He reports that he does not use drugs.  Allergies: No Known Allergies  Medications: I have reviewed the patient's current medications.  Results for orders placed or performed during the hospital encounter of 04/11/20 (from the past 48 hour(s))  Comprehensive metabolic panel     Status: Abnormal   Collection Time: 04/15/20  3:28 AM  Result Value Ref Range   Sodium 131 (L) 135 - 145 mmol/L   Potassium 3.3 (L) 3.5 - 5.1 mmol/L   Chloride 97 (L) 98 - 111 mmol/L   CO2 22 22 - 32 mmol/L   Glucose, Bld 119 (H) 70 - 99 mg/dL    Comment: Glucose reference range applies only to samples taken after fasting for at  least 8 hours.   BUN 7 6 - 20 mg/dL   Creatinine, Ser 0.68 0.61 - 1.24 mg/dL   Calcium 8.8 (L) 8.9 - 10.3 mg/dL   Total Protein 7.4 6.5 - 8.1 g/dL   Albumin 2.9 (L) 3.5 - 5.0 g/dL   AST 126 (H) 15 - 41 U/L   ALT 83 (H) 0 - 44 U/L   Alkaline Phosphatase 41 38 - 126 U/L   Total Bilirubin 1.4 (H) 0.3 - 1.2 mg/dL   GFR, Estimated >60 >60 mL/min   Anion gap 12 5 - 15    Comment: Performed at [facility] [address], [place] [address]  CBC     Status: Abnormal   Collection Time: 04/15/20  3:28 AM  Result Value Ref Range   WBC 8.7 4.0 - 10.5 K/uL   RBC 3.46 (L) 4.22 - 5.81 MIL/uL   Hemoglobin 11.7 (L) 13.0 - 17.0 g/dL   HCT 33.2 (L) 39 - 52 %   MCV 96.0 80.0 - 100.0 fL   MCH 33.8 26.0 - 34.0 pg   MCHC 35.2 30.0 - 36.0 g/dL   RDW 13.8 11.5 - 15.5 %  Platelets 108 (L) 150 - 400 K/uL    Comment: REPEATED TO VERIFY Immature Platelet Fraction may be clinically indicated, consider ordering this additional test [id number]    nRBC 0.0 0.0 - 0.2 %    Comment: Performed at [facility], [place] [address] [place], [place] [address]  Magnesium     Status: None   Collection Time: 04/15/20  3:28 AM  Result Value Ref Range   Magnesium 1.9 1.7 - 2.4 mg/dL    Comment: Performed at [facility] [address], [place], [place] [address]  Culture, blood (routine x 2)     Status: None (Preliminary result)   Collection Time: 04/15/20 10:27 AM   Specimen: BLOOD LEFT HAND  Result Value Ref Range   Specimen Description BLOOD LEFT HAND    Special Requests      BOTTLES DRAWN AEROBIC ONLY Blood Culture adequate volume   Culture      NO GROWTH < 24 HOURS Performed at [facility], [place] [address], [place], [place] [address]    Report Status PENDING   Culture, blood (routine x 2)     Status: None (Preliminary result)   Collection Time: 04/15/20 10:31 AM   Specimen: BLOOD RIGHT HAND  Result Value Ref Range   Specimen Description BLOOD RIGHT HAND    Special  Requests      BOTTLES DRAWN AEROBIC AND ANAEROBIC Blood Culture adequate volume   Culture      NO GROWTH < 24 HOURS Performed at [facility], [place] [address] [place], [place] [address]    Report Status PENDING   Ammonia     Status: Abnormal   Collection Time: 04/15/20 12:33 PM  Result Value Ref Range   Ammonia 47 (H) 9 - 35 umol/L    Comment: Performed at [facility], [place] [address] [place], [place] [address]  Urinalysis, Routine w reflex microscopic Urine, Clean Catch     Status: None   Collection Time: 04/15/20  5:44 PM  Result Value Ref Range   Color, Urine YELLOW YELLOW   APPearance CLEAR CLEAR   Specific Gravity, Urine 1.006 1.005 - 1.030   pH 7.0 5.0 - 8.0   Glucose, UA NEGATIVE NEGATIVE mg/dL   Hgb urine dipstick NEGATIVE NEGATIVE   Bilirubin Urine NEGATIVE NEGATIVE   Ketones, ur NEGATIVE NEGATIVE mg/dL   Protein, ur NEGATIVE NEGATIVE mg/dL   Nitrite NEGATIVE NEGATIVE   Leukocytes,Ua NEGATIVE NEGATIVE    Comment: Performed at [facility] [address], [place], [place] [address]  CBC     Status: Abnormal   Collection Time: 04/16/20  2:20 AM  Result Value Ref Range   WBC 10.0 4.0 - 10.5 K/uL   RBC 3.41 (L) 4.22 - 5.81 MIL/uL   Hemoglobin 11.5 (L) 13.0 - 17.0 g/dL   HCT 32.5 (L) 39 - 52 %   MCV 95.3 80.0 - 100.0 fL   MCH 33.7 26.0 - 34.0 pg   MCHC 35.4 30.0 - 36.0 g/dL   RDW 13.3 11.5 - 15.5 %   Platelets 142 (L) 150 - 400 K/uL   nRBC 0.0 0.0 - 0.2 %    Comment: Performed at [facility], [place] [address], [place], [place] [address]  Comprehensive metabolic panel     Status: Abnormal   Collection Time: 04/16/20  2:20 AM  Result Value Ref Range   Sodium 131 (L) 135 - 145 mmol/L   Potassium  3.6 3.5 - 5.1 mmol/L   Chloride 98 98 - 111 mmol/L   CO2 23 22 - 32 mmol/L   Glucose, Bld 118 (H) 70 - 99 mg/dL    Comment: Glucose reference range applies only to samples taken after fasting for at least 8 hours.   BUN 5 (L) 6 - 20 mg/dL    Creatinine, Ser 0.65 0.61 - 1.24 mg/dL   Calcium 9.0 8.9 - 10.3 mg/dL   Total Protein 7.5 6.5 - 8.1 g/dL   Albumin 2.9 (L) 3.5 - 5.0 g/dL   AST 95 (H) 15 - 41 U/L   ALT 69 (H) 0 - 44 U/L   Alkaline Phosphatase 39 38 - 126 U/L   Total Bilirubin 1.0 0.3 - 1.2 mg/dL   GFR, Estimated >60 >60 mL/min   Anion gap 10 5 - 15    Comment: Performed at [facility], [place] [address] [place], [place] [address]  Uric acid     Status: None   Collection Time: 04/16/20  2:20 AM  Result Value Ref Range   Uric Acid, Serum 5.0 3.7 - 8.6 mg/dL    Comment: Performed at [facility] [address], [place], [place] [address]  Respiratory Panel by RT PCR (Flu A&B, Covid) - Nasopharyngeal Swab     Status: None   Collection Time: 04/16/20  9:55 AM   Specimen: Nasopharyngeal Swab  Result Value Ref Range   SARS Coronavirus 2 by RT PCR NEGATIVE NEGATIVE    Comment: (NOTE) SARS-CoV-2 target nucleic acids are NOT DETECTED.  The SARS-CoV-2 RNA is generally detectable in upper respiratoy specimens during the acute phase of infection. The lowest concentration of SARS-CoV-2 viral copies this assay can detect is 131 copies/mL. A negative result does not preclude SARS-Cov-2 infection and should not be used as the sole basis for treatment or other patient management decisions. A negative result may occur with  improper specimen collection/handling, submission of specimen other than nasopharyngeal swab, presence of viral mutation(s) within the areas targeted by this assay, and inadequate number of viral copies (<131 copies/mL). A negative result must be combined with clinical observations, patient history, and epidemiological information. The expected result is Negative.  Fact Sheet for Patients:  PinkCheek.be  Fact Sheet for Healthcare Providers:  GravelBags.it  This test is no t yet approved or cleared by the [place] FDA and  has been  authorized for detection and/or diagnosis of SARS-CoV-2 by FDA under an Emergency Use Authorization (EUA). This EUA will remain  in effect (meaning this test can be used) for the duration of the COVID-19 declaration under Section 564(b)(1) of the Act, 21 U.S.C. section 360bbb-3(b)(1), unless the authorization is terminated or revoked sooner.     Influenza A by PCR NEGATIVE NEGATIVE   Influenza B by PCR NEGATIVE NEGATIVE    Comment: (NOTE) The Xpert Xpress SARS-CoV-2/FLU/RSV assay is intended as an aid in  the diagnosis of influenza from Nasopharyngeal swab specimens and  should not be used as a sole basis for treatment. Nasal washings and  aspirates are unacceptable for Xpert Xpress SARS-CoV-2/FLU/RSV  testing.  Fact Sheet for Patients: PinkCheek.be  Fact Sheet for Healthcare Providers: GravelBags.it  This test is not yet approved or cleared by the [place] FDA and  has been authorized for detection and/or diagnosis of SARS-CoV-2 by  FDA under an Emergency Use Authorization (EUA). This EUA will remain  in effect (meaning this test can be used) for the duration of  the  Covid-19 declaration under Section 564(b)(1) of the Act, 21  U.S.C. section 360bbb-3(b)(1), unless the authorization is  terminated or revoked. Performed at [facility], [place] [address], [place], [place] [address]     DG Chest 2 View  Result Date: 04/15/2020 CLINICAL DATA:  Fever. EXAM: CHEST - 2 VIEW COMPARISON:  April 12, 2020. FINDINGS: The heart size and mediastinal contours are within normal limits. Both lungs are clear. The visualized skeletal structures are unremarkable. IMPRESSION: No active cardiopulmonary disease. Electronically Signed   By: Marijo Conception M.D.   On: 04/15/2020 08:51   DG ELBOW COMPLETE RIGHT (3+VIEW)  Result Date: 04/16/2020 CLINICAL DATA:  Pain and redness EXAM: RIGHT ELBOW - COMPLETE 3+ VIEW COMPARISON:  None.  FINDINGS: Frontal, lateral, and bilateral oblique views were obtained. There is no fracture or dislocation. Equivocal joint effusion. No joint space narrowing or erosion. IMPRESSION: Equivocal joint effusion. This is a finding that potentially may have arthropathic etiology or could be secondary to recent trauma. No fracture is evident. No dislocation. No joint space narrowing or erosion. Electronically Signed   By: Lowella Grip III M.D.   On: 04/16/2020 12:26    Review of Systems  Constitutional: Positive for fever. Negative for chills and diaphoresis.  HENT: Negative for ear discharge, ear pain, hearing loss and tinnitus.   Eyes: Negative for photophobia and pain.  Respiratory: Negative for cough and shortness of breath.   Cardiovascular: Negative for chest pain.  Gastrointestinal: Negative for abdominal pain, nausea and vomiting.  Genitourinary: Negative for dysuria, flank pain, frequency and urgency.  Musculoskeletal: Positive for arthralgias (Right elbow). Negative for back pain, myalgias and neck pain.  Neurological: Positive for weakness. Negative for dizziness and headaches.  Hematological: Does not bruise/bleed easily.  Psychiatric/Behavioral: The patient is not nervous/anxious.    Blood pressure (!) 155/99, pulse 98, temperature 99.1 F (37.3 C), resp. rate 17, height 5\' 11"  (1.803 m), weight 85.7 kg, SpO2 97 %. Physical Exam Constitutional:      General: He is not in acute distress.    Appearance: He is well-developed. He is not diaphoretic.  HENT:     Head: Normocephalic and atraumatic.  Eyes:     General: No scleral icterus.       Right eye: No discharge.        Left eye: No discharge.     Conjunctiva/sclera: Conjunctivae normal.  Cardiovascular:     Rate and Rhythm: Normal rate and regular rhythm.  Pulmonary:     Effort: Pulmonary effort is normal. No respiratory distress.  Musculoskeletal:     Cervical back: Normal range of motion.     Comments: Right shoulder,  elbow, wrist, digits- no skin wounds, mild erythema about elbow, mild-mod pain with palp, swollen, no bursal fullness, PROM 80-115, no instability, no blocks to motion  Sens  Ax/R/M/U intact  Mot   Ax/ R/ PIN/ M/ AIN/ U intact  Rad 2+  Skin:    General: Skin is warm and dry.  Neurological:     Mental Status: He is alert.  Psychiatric:        Behavior: Behavior normal.     Assessment/Plan: Right elbow pain -- Will perform arthrocentesis to r/o septic arthritis. Please keep NPO until GS results. Multiple medical problems including alcohol use disorder, pancreatitis, HTN, CKD, and HLD -- per primary service    Lisette Abu, PA-C Orthopedic Surgery [phone] 04/16/2020, 2:26 PM

## 2020-04-16 NOTE — Progress Notes (Signed)
Spoke with PA, Mr. Jacqulynn Cadet, on call for orthopedic surgery regarding concern for septic joint. Ortho to see pt.   Lyndee Hensen, DO PGY-2, [facility] 04/16/2020 1:49 PM

## 2020-04-16 NOTE — NC FL2 (Signed)
[place] MEDICAID FL2 LEVEL OF CARE SCREENING TOOL     IDENTIFICATION  Patient Name: Stephen Bowers Birthdate: 09/27/1960 Sex: male Admission Date (Current Location): 04/11/2020  [facility] and [place] Number:  Herbalist and Address:  [facility], [place] [address] [place], [place] [address]      Provider Number: [id number]  Attending Physician Name and Address:  [name], MD  Relative Name and Phone Number:  Stephen Bowers, Demaria   [phone]    Current Level of Care: Hospital Recommended Level of Care: [place] Prior Approval Number:    Date Approved/Denied:   PASRR Number: [id number]  Discharge Plan: SNF    Current Diagnoses: Patient Active Problem List   Diagnosis Date Noted  . Alcohol abuse   . Fever   . AMS (altered mental status)   . Thrombocytopenia ([place])   . Seizure-like activity ([place]) 04/11/2020  . Alcohol withdrawal seizure ([place]) 04/11/2020  . Alcohol use disorder, mild, abuse 02/24/2019  . Alcoholic fatty liver 78/24/2353  . Prediabetes 02/24/2019  . Normocytic anemia 02/24/2019  . Acute pancreatitis 12/20/2017  . Increased anion gap metabolic acidosis   . Severe sepsis ([place])   . Abnormal LFTs 12/04/2017  . Pancreatitis 12/04/2017  . Hyperlipidemia 10/16/2016  . AKI (acute kidney injury) ([place])   . HTN (hypertension) 03/02/2015  . Vitamin D deficiency 01/19/2015  . Low back pain potentially associated with radiculopathy 03/30/2014    Orientation RESPIRATION BLADDER Height & Weight     Self, Time, Situation, Place  Normal Incontinent Weight: 189 lb (85.7 kg) Height:  5\' 11"  (180.3 cm)  BEHAVIORAL SYMPTOMS/MOOD NEUROLOGICAL BOWEL NUTRITION STATUS    Convulsions/Seizures (alcohol withdrawal) Continent Diet (heart diet, see discharge summary)  AMBULATORY STATUS COMMUNICATION OF NEEDS Skin   Limited Assist Verbally Normal                       Personal Care Assistance Level of  Assistance  Bathing, Feeding, Dressing Bathing Assistance: Limited assistance Feeding assistance: Independent Dressing Assistance: Limited assistance     Functional Limitations Info  Sight, Hearing, Speech Sight Info: Adequate Hearing Info: Adequate Speech Info: Adequate    SPECIAL CARE FACTORS FREQUENCY  PT (By licensed PT), OT (By licensed OT)     PT Frequency: 5x week OT Frequency: 5x week            Contractures Contractures Info: Not present    Additional Factors Info  Code Status, Allergies Code Status Info: full Allergies Info: NKA           Current Medications (04/16/2020):  This is the current hospital active medication list Current Facility-Administered Medications  Medication Dose Route Frequency Provider Last Rate Last Admin  . acetaminophen (TYLENOL) tablet 650 mg  650 mg Oral Q6H PRN Zola Button, MD   650 mg at 04/16/20 0601   Or  . acetaminophen (TYLENOL) suppository 650 mg  650 mg Rectal Q6H PRN Zola Button, MD      . amLODipine (NORVASC) tablet 10 mg  10 mg Oral Daily Zola Button, MD   10 mg at 04/16/20 0800  . enoxaparin (LOVENOX) injection 40 mg  40 mg Subcutaneous Q24H Brimage, Vondra, DO   40 mg at 04/15/20 1155  . folic acid (FOLVITE) tablet 1 mg  1 mg Oral Daily Zola Button, MD   1 mg at 04/16/20 0800  . influenza vac split quadrivalent PF (FLUARIX) injection 0.5 mL  0.5  mL Intramuscular Prior to discharge Martyn Malay, MD      . multivitamin with minerals tablet 1 tablet  1 tablet Oral Daily Zola Button, MD   1 tablet at 04/16/20 0800  . pantoprazole (PROTONIX) EC tablet 40 mg  40 mg Oral Daily Brimage, Vondra, DO   40 mg at 04/16/20 0800  . polyethylene glycol (MIRALAX / GLYCOLAX) packet 17 g  17 g Oral Daily PRN Zola Button, MD      . thiamine tablet 100 mg  100 mg Oral Daily Zola Button, MD   100 mg at 04/16/20 0801   Or  . thiamine (B-1) injection 100 mg  100 mg Intravenous Daily Zola Button, MD   100 mg at 04/12/20 1044      Discharge Medications: Please see discharge summary for a list of discharge medications.  Relevant Imaging Results:  Relevant Lab Results:   Additional Information SSN [id number]  Joanne Chars, LCSW

## 2020-04-16 NOTE — Plan of Care (Signed)

## 2020-04-16 NOTE — Progress Notes (Signed)
Attempted right upper extremity venous duplex, however per RN, patient is about to have joint aspiration performed. Will attempt again as schedule permits.  04/16/2020 3:22 PM Kelby Aline., MHA, RVT, RDCS, RDMS

## 2020-04-16 NOTE — Progress Notes (Addendum)
Family Medicine Teaching Service Daily Progress Note Intern Pager: [phone]  Patient name: Stephen Bowers Medical record number: [id number] Date of birth: 06/01/1961 Age: 59 y.o. Gender: male  Primary Care Provider: Ladell Pier, MD Consultants: Neurology Code Status: Full  Pt Overview and Major Events to Date:  Patient admitted 10/10  Assessment and Plan:  Stephen Bowers a 59 y.o.malepresenting withseizure likely related to alcohol withdrawal. PMH is significant foralcohol use disorder, pancreatitis, HTN, CKD, HLD.  Seizure-like activity likely 2/2 alcohol withdrawal Scheduled dose of 1 mg Ativan administered last night. Notably, this has been the only dose required in the past 24 hours. His nurse denies any overnight events. No continuing visual or auditory hallucinations. CIWA scores 1>1>1>0 overnight. Will switch to PRN Ativan. PT/OT consult recommending SNF at discharge. - Restart IV Fluids given increase in creatine.  - PRN Ativan per CIWA protocol - Discontinue scheduled Ativan - Consult SW regarding discharge disposition  Fever of unknown origin Fever initially on 10/14. Noted at 100.1>99.9>100.6 before returning to baseline. CXR and UA unremarkable. Urine and blood cultures showing no growth at 24 hours. Patient fevering this morning at 102.2>102.36F. Treated with tylenol 650 mg. Patient evaluated overnight with no symptoms. He continues to deny N/V, congestion, rhinorrhea, cough, chills, or any pain this morning on exam. No active skin lesions on extensive exam. Possible VTE in right upper extremity vs gouty flare.  - Repeat respiratory virus panel - am CBC with diff - Obtain uric acid - RUE Dopplers and XR.  - Consider broad spectrum antibiotics   RUE pain All RUE imaging negative for acute fractures. IV transferred to left arm. Patient notes tenderness over the lateral epicondyle. Swollen, erythematous, warm to the touch, and tender to palpation on exam.  There is concern for septic joint therefore will consult orthopedic surgery.  - RUE DVT US - XR right elbow  AKI, resolved Cr 1.97 on admission. Baseline appears to be around 0.8. CK 645. CMP shows Cr 0.65 this morning. - Continue to monitor - am CMP  Anion gap metabolic acidosis, resolved Bicarb at 20 and gap of 19 on admission. Ethanol induced lactic acidosis initially suspected. Lactic acid came back at 1.4. Anion gap at10this morning. - Continue to monitor clinically - am CMP  Hypokalemia, stable K 3.3 on admission. Repleted. Most recent K 3.6 this morning. - Continue to monitor clinically - am CMP  Hyponatremia Na 128 on admission. Likely secondary to dehydration with history or vomiting and beer potomania. Last Na131 this morning, unchanged from 10/13. -Continue to encourage po intake - Continue to monitorclinically - am CMP  HTN Home medications include amlodipine 10 mg qd and carvedilol 12.5 mg BID. Wife reports that patient has been noncompliantwith medications. BP on admission 136/92. Last BP155/99 this morning. -Continue amlodipine 10 mg - Restart (half home dose) Coreg 6.25 mg BID  Transaminitis, improving AST/ALT 242/73 on admission, likely alcohol-related given AST:ALT ratio > 2.AST/ALTelevated most recently at 70/6. Ammonia was mildly elevated at 47. . Lipase 31. Hepatitis panel negative.RUQ ultrasound significant for fatty liver, but no focal lesions or biliary pathology. - Continue to monitor clinically - am CMP - start lactulose as pt is concurrently with decreased stool output  Thrombocytopenia, improving Platelets 50 on admission, likely secondary to alcohol use.Baseline appears to be 125-150.Platelets most recently142 this morning,returned to baseline. - Continue to monitor clinically - am CBC -SQ heparin  Normocytic anemia Hemoglobin 11.7 on admission.Appears to be near his baseline. Suspect component of myelosuppression  from  alcohol use. Hgb 11.5today.Last colonoscopy in September 2019 with findings of 3 sessile polyps (2 to 5 mm) in the rectum, sigmoid colon, and transverse colon.Repeat in 5 years.Iron 66, TIBC 326, ferritin 165, B12 1087. -Continue to monitor clinically. - am CBC  Alcohol use disorder Per wife, patient drinks alcohol daily (a pint of liquor and a couple of beers every day). -Thiamine 100 mg qd -Alcohol cessation counseling -Consider starting medication for alcohol cessation such as naltrexoneat discharge  GERD Patient home medications include pantoprazole 40 mg qd. Will continue home medications. - Protonix 40 mg qd  FEN/GI: Heart healthy PPx: Lovenox  Disposition: SNF per PT/OT recs  Subjective:  Patient fevering overnight. Night team evaluated and he denied any N/V, congestion, rhinorrhea, generalized or localized pain, night sweats, or chills. Patient has been eating and drinking well. Administered tylenol 650 mg early this morning with resolution of fever. Patient evaluated at the bedside this morning.   Objective: Temp:  [98.5 F (36.9 C)-102.4 F (39.1 C)] 102.4 F (39.1 C) (10/15 0627) Pulse Rate:  [91-118] 98 (10/15 0551) Resp:  [14-19] 16 (10/15 0551) BP: (90-144)/(63-93) 144/87 (10/15 0551) SpO2:  [98 %-99 %] 98 % (10/15 0551) Physical Exam: General: Patient is a pleasant male lying supine in bed, resting comfortably, in no apparent distress. Cardiovascular: Normal S1 and S2 with no murmurs, rubs, or gallops. RRR. Distal pulses 2+ and symmetrical. Respiratory: CTA in all fields with no wheezing, rales, or rhonchi. Chest rise is symmetrical with no increased labor of breathing. Abdominal: Soft, non distended, non tender to palpation. No hepatosplenomegaly appreciated. Bowel sounds normoactive. Skin: No active rashes or lesions on exam. Extremities: No peripheral edema or cyanosis. Significant swelling and erythema over lateral epicondyle on RUE. Tender  to palpation. Patient unable to fully extend elbow.  Neurological: Alert and oriented x4. Normal mentation.   Laboratory: Recent Labs  Lab 04/14/20 0524 04/15/20 0328 04/16/20 0220  WBC 8.8 8.7 10.0  HGB 11.3* 11.7* 11.5*  HCT 31.5* 33.2* 32.5*  PLT 84* 108* 142*   Recent Labs  Lab 04/14/20 0524 04/15/20 0328 04/16/20 0220  NA 131* 131* 131*  K 3.4* 3.3* 3.6  CL 96* 97* 98  CO2 19* 22 23  BUN 7 7 5*  CREATININE 0.87 0.68 0.65  CALCIUM 9.0 8.8* 9.0  PROT 7.5 7.4 7.5  BILITOT 2.1* 1.4* 1.0  ALKPHOS 38 41 39  ALT 104* 83* 69*  AST 234* 126* 95*  GLUCOSE 148* 119* 118*     Imaging/Diagnostic Tests: DG Chest 2 View  Result Date: 04/15/2020 CLINICAL DATA:  Fever. EXAM: CHEST - 2 VIEW COMPARISON:  April 12, 2020. FINDINGS: The heart size and mediastinal contours are within normal limits. Both lungs are clear. The visualized skeletal structures are unremarkable. IMPRESSION: No active cardiopulmonary disease. Electronically Signed   By: Marijo Conception M.D.   On: 04/15/2020 08:51     Cherre Blanc T, Medical Student 04/16/2020, 7:36 AM FPTS Intern pager: [phone], text pages welcome     [place] NOTE   I personally evaluated this patient along with the student, and verified all aspects of the history, physical exam, and medical decision making as documented by the student. I agree with the student's documentation and have made all necessary edits.  Lyndee Hensen, DO PGY-1, [facility] 04/16/2020 2:04 PM

## 2020-04-16 NOTE — Progress Notes (Signed)
I saw and examined Stephen Bowers.  I discussed with the full resident team in rounds.  I will cosign their progress note when available.   Stephen Bowers is still quite sleepy.  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  Other issues. Fever, still no source.  The only clue on physical is a red and swollen right elbow.  Gout? Septic arthritis?  (why would he have septic arthritis, no source.)  We have Xray, uric acid and upper ext doppler pending.   Creat has crept up.  We may need additional hydration.  He has an AKI with admit creat = 0.65 and now 1.17  He does not have CKD.

## 2020-04-16 NOTE — Progress Notes (Addendum)
FPTS Interim Progress Note  S: Per chart review, noted that patient had temperature of 102.2 F this morning around 6am. Patient reports that he is unaware of how this fever originated. Nurse was in the room getting a repeat temperature while I was present, repeat was 102.4 and nurse administered tylenol 650 mg as well this morning. Nurse denies any overnight events. Patient denies nausea, vomiting, congestion, rhinorrhea, generalized or localized pain, night sweats and chills. Endorses that he has been eating well. Denies both auditory and visual hallucinations.  O: BP (!) 144/87 (BP Location: Left Arm)   Pulse 98   Temp (!) 102.4 F (39.1 C) (Oral)   Resp 16   Ht 5\' 11"  (1.803 m)   Wt 85.7 kg   SpO2 98%   BMI 26.36 kg/m   General: Patient sitting upright in bed comfortably, in no acute distress.Well-appearing and not diaphoretic.  HEENT: supple neck without evidence of lymphadenopathy  CV: RRR, no murmurs or gallops auscultated  Resp: lungs clear to auscultation bilaterally, breathing comfortably on room air Ext: radial pulses intact bilaterally  Neuro: AOx4, conversationally appropriate  Psych: mood appropriate, no agitation noted   A/P: Patient had temp of 100.6 on 10/14. Work up including UA, CXR were negative. Skin with no obvious breakdown. Blood culture neg x 12 hours. Urine culture pending. No clear source of fever at this time. No infectious symptoms. - at this time will follow up urine and blood culture - obtain repeat COVID testing  - If febrile again, repeat blood culture, UA/culture, and CXR and consider starting ABx   Donney Dice, DO 04/16/2020, 6:34 AM PGY-1, [facility] pager [place], [place], PGY3 04/16/2020 6:53 AM

## 2020-04-16 NOTE — Progress Notes (Signed)
Orthopedics Plan Of Care:   Labs reviewed from right elbow aspiration. Synovial cell count and differential revealed increased WBC and intracellular monosodium urate crystals consistent with gout.  Patient can begin diet.   Orthopedics will sign off.   Braelin Costlow PA-C  [facility] - PA with Dr. Stann Mainland.

## 2020-04-17 ENCOUNTER — Inpatient Hospital Stay (HOSPITAL_COMMUNITY): Payer: Self-pay

## 2020-04-17 ENCOUNTER — Other Ambulatory Visit (HOSPITAL_COMMUNITY): Payer: Self-pay | Admitting: Family Medicine

## 2020-04-17 DIAGNOSIS — M79609 Pain in unspecified limb: Secondary | ICD-10-CM

## 2020-04-17 DIAGNOSIS — M109 Gout, unspecified: Secondary | ICD-10-CM

## 2020-04-17 LAB — BLOOD CULTURE ID PANEL (REFLEXED) - BCID2

## 2020-04-17 LAB — COMPREHENSIVE METABOLIC PANEL
ALT: 55 U/L — ABNORMAL HIGH (ref 0–44)
AST: 74 U/L — ABNORMAL HIGH (ref 15–41)
Albumin: 2.7 g/dL — ABNORMAL LOW (ref 3.5–5.0)
Alkaline Phosphatase: 32 U/L — ABNORMAL LOW (ref 38–126)
Anion gap: 12 (ref 5–15)
BUN: 7 mg/dL (ref 6–20)
CO2: 23 mmol/L (ref 22–32)
Calcium: 9.1 mg/dL (ref 8.9–10.3)
Chloride: 97 mmol/L — ABNORMAL LOW (ref 98–111)
Creatinine, Ser: 0.64 mg/dL (ref 0.61–1.24)
GFR, Estimated: >60 mL/min (ref >60)
Glucose, Bld: 112 mg/dL — ABNORMAL HIGH (ref 70–99)
Potassium: 3.4 mmol/L — ABNORMAL LOW (ref 3.5–5.1)
Sodium: 132 mmol/L — ABNORMAL LOW (ref 135–145)
Total Bilirubin: 1.1 mg/dL (ref 0.3–1.2)
Total Protein: 6.9 g/dL (ref 6.5–8.1)

## 2020-04-17 LAB — CBC
HCT: 29.1 % — ABNORMAL LOW (ref 39.0–52.0)
Hemoglobin: 10.2 g/dL — ABNORMAL LOW (ref 13.0–17.0)
MCH: 33.4 pg (ref 26.0–34.0)
MCHC: 35.1 g/dL (ref 30.0–36.0)
MCV: 95.4 fL (ref 80.0–100.0)
Platelets: 171 10*3/uL (ref 150–400)
RBC: 3.05 MIL/uL — ABNORMAL LOW (ref 4.22–5.81)
RDW: 13.2 % (ref 11.5–15.5)
WBC: 8.7 10*3/uL (ref 4.0–10.5)
nRBC: 0 % (ref 0.0–0.2)

## 2020-04-17 MED ORDER — AMLODIPINE BESYLATE 10 MG PO TABS: ORAL_TABLET | ORAL | 6 refills | Status: DC

## 2020-04-17 MED ORDER — COLCHICINE 0.6 MG PO TABS: 0.6000 mg | ORAL_TABLET | Freq: Two times a day (BID) | ORAL | 0 refills | Status: DC

## 2020-04-17 MED ORDER — CARVEDILOL 12.5 MG PO TABS: 12.5000 mg | ORAL_TABLET | Freq: Two times a day (BID) | ORAL | 0 refills | Status: DC

## 2020-04-17 MED ORDER — POTASSIUM CHLORIDE 20 MEQ PO PACK
40.0000 meq | PACK | Freq: Once | ORAL | Status: AC
  Administered 2020-04-17: 40 meq via ORAL
  Filled 2020-04-17: qty 2

## 2020-04-17 MED ORDER — COLCHICINE 0.6 MG PO TABS
0.6000 mg | ORAL_TABLET | Freq: Two times a day (BID) | ORAL | Status: DC
  Administered 2020-04-17: 0.6 mg via ORAL
  Filled 2020-04-17: qty 1

## 2020-04-17 NOTE — Progress Notes (Signed)
Notified Family Med on call regarding patient Sodium 132, Potassium 3.4; Magnesium 1.9, Creat WNL. Also noted patient to have low grade temp throughout the night.

## 2020-04-17 NOTE — Discharge Instructions (Signed)
You were hospitalized at [facility] due to seizure activity.  We expect this is from withdrawal for alcohol which improved after getting medication to prevent to prevent withdrawal.  We are so glad you are feeling better.  Be sure to follow-up with your regularly scheduled PCP appointments. It is important that your stop drinking alcohol. Thank you for allowing [place] to take care of you.  Take care, [facility] family medicine team

## 2020-04-17 NOTE — Progress Notes (Signed)
Right upper extremity venous duplex has been completed. Preliminary results can be found in CV Proc through chart review.   04/17/20 11:58 AM Stephen Bowers RVT

## 2020-04-17 NOTE — Progress Notes (Signed)
Family Medicine Teaching Service Daily Progress Note Intern Pager: [phone]  Patient name: Stephen Bowers Medical record number: [id number] Date of birth: 24-Mar-1961 Age: 59 y.o. Gender: male  Primary Care Provider: Ladell Pier, MD Consultants: Neurology  Code Status: Full  Pt Overview and Major Events to Date:  Admitted 10/10  Assessment and Plan: Stephen Bowers a 81 y.o.malepresenting withseizure likely related to alcohol withdrawal. PMH is significant foralcohol use disorder, pancreatitis, HTN, CKD, HLD.  Seizure-like activity likely 2/2 alcohol withdrawal AOx4 on exam. No further seizure activity since prior to admission. CIWAs have been 0 overnight. Denies any auditory or visual hallucinations. Denies nausea and vomiting.  PT/OT consult recommending SNF at discharge. -monitor CIWAs - PRN Ativan per CIWA protocol - PT/OT recommending SNF  Fever of unknown origin Likely resolved. Patient has remained afebrile throughout the night. CXR and UA unremarkable. Blood cultures showing no growth at 24 hours.  Denies rash, congestion, rhinorrhea, cough, chills, or any pain this morning on exam. Repeat COVID test was negative.  -continue to monitor vitals -pending urine culture -no indication for antibiotics at this time  Gout Most likely secondary to excessive alcohol use. Patient experiencing right upper extremity pain and tenderness. All RUE imaging negative for acute fractures. IV transferred to left arm. Patient notes tenderness over the lateral epicondyle. Swollen, erythematous, warm to the touch, and tender to palpation on exam. Right elbow aspiration demonstrated increased WBC and intracellular monosodium urate crystals consistent with gout. XR right elbow showed no fracture of dislocation but joint effusion.  -start colchicine 0.6 mg bid given acute flare up  Hypokalemia K 3.3 on admission. Most recent K 3.4 this morning. Repletion of KCl 40 mEq given.   -repeat CMP and monitor K  Hyponatremia Na 128 on admission. Likely secondary to dehydration and decreased PO intake in the setting of alcohol abuse. Last Na132 this morning. -Continue to encourage po intake - monitor with CMP  HTN Home medications include amlodipine 10 mg qd and carvedilol 12.5 mg BID. BP on admission 136/92. BP this morning 149/92. -Continue amlodipine 10 mg - continue Coreg 6.25 mg BID  Transaminitis, improving Improving, likely alcohol related given AST:ALT ratio > 2. AST/ALT 242/73 on admission while today AST 74 and ALT 55.  Hepatitis panel negative.RUQ ultrasound significant for fatty liver, but no focal lesions or biliary pathology. - continue to monitor LFTs with am CMP -lactulose   Thrombocytopenia Resolved. Platelets 171 wnl this morning compared to admission at platelets 50 , likely reactive causes secondary to alcohol use.Baseline appears to be 125-150. - CBC   Normocytic anemia Hgb 10.2 this morning. Hemoglobin 11.7 on admission.Suspect component of myelosuppression from alcohol use. Last colonoscopy in September 2019 with findings of 3 sessile polyps (2 to 5 mm) in the rectum, sigmoid colon, and transverse colon.Repeat in 5 years. - monitor Hgb daily   Alcohol use disorder -Thiamine 100 mg qd -Alcohol cessation counseling -Consider starting medication for alcohol cessation such as naltrexoneat discharge  GERD Patient home medications include pantoprazole 40 mg qd. Will continue home medications. - Protonix 40 mg qd   FEN/GI: heart healthy diet  PPx: Lovenox    Status is: Inpatient  Disposition: potentially SNF per PT/OT recs     Subjective:  No overnight events reported.  Objective: Temp:  [99.1 F (37.3 C)-102.4 F (39.1 C)] 99.9 F (37.7 C) (10/15 2155) Pulse Rate:  [94-98] 94 (10/15 2155) Resp:  [16-20] 20 (10/15 2155) BP: (106-155)/(81-99) 149/92 (10/15 2155) SpO2:  [  97 %-100 %] 100 % (10/15  2155) Physical Exam: General: Patient sitting upright in bed, in no acute distress. Cardiovascular: RRR, no murmurs or gallops  Respiratory: lungs clear to auscultation bilaterally, breathing comfortably on room air  Abdomen: soft, nontender, presence of active bowel sounds Neuro: AOx4, appropriately conversational  Extremities: radial and distal pulses intact bilaterally, no LE edema noted   Laboratory: Recent Labs  Lab 04/14/20 0524 04/15/20 0328 04/16/20 0220  WBC 8.8 8.7 10.0  HGB 11.3* 11.7* 11.5*  HCT 31.5* 33.2* 32.5*  PLT 84* 108* 142*   Recent Labs  Lab 04/14/20 0524 04/15/20 0328 04/16/20 0220  NA 131* 131* 131*  K 3.4* 3.3* 3.6  CL 96* 97* 98  CO2 19* 22 23  BUN 7 7 5*  CREATININE 0.87 0.68 0.65  CALCIUM 9.0 8.8* 9.0  PROT 7.5 7.4 7.5  BILITOT 2.1* 1.4* 1.0  ALKPHOS 38 41 39  ALT 104* 83* 69*  AST 234* 126* 95*  GLUCOSE 148* 119* 118*      Imaging/Diagnostic Tests: DG ELBOW COMPLETE RIGHT (3+VIEW)  Result Date: 04/16/2020 CLINICAL DATA:  Pain and redness EXAM: RIGHT ELBOW - COMPLETE 3+ VIEW COMPARISON:  None. FINDINGS: Frontal, lateral, and bilateral oblique views were obtained. There is no fracture or dislocation. Equivocal joint effusion. No joint space narrowing or erosion. IMPRESSION: Equivocal joint effusion. This is a finding that potentially may have arthropathic etiology or could be secondary to recent trauma. No fracture is evident. No dislocation. No joint space narrowing or erosion. Electronically Signed   By: Lowella Grip III M.D.   On: 04/16/2020 12:26    Donney Dice, DO 04/17/2020, 1:03 AM PGY-1, [place] Intern pager: [phone], text pages welcome

## 2020-04-17 NOTE — TOC Transition Note (Signed)
Transition of Care [facility]) - CM/SW Discharge Note   Patient Details  Name: Stephen Bowers MRN: [id number] Date of Birth: 1960-08-11  Transition of Care [facility]) CM/SW Contact:  Carles Collet, RN Phone Number: 04/17/2020, 3:40 PM   Clinical Narrative:   Damaris Schooner w patyient's wife. She identified needing 3/1 and RW for home use. Ordered through [employer] .  Referral made to [facility] for assessment for charity [facility] PT OT. Patient to DC to home w wife. Discussed her filing for medicaid with SS office.     Final next level of care: Home/Self Care Barriers to Discharge: No Barriers Identified   Patient Goals and CMS Choice Patient states their goals for this hospitalization and ongoing recovery are:: "get back to regular life" CMS Medicare.gov Compare Post Acute Care list provided to:: Patient Choice offered to / list presented to : Patient  Discharge Placement                       Discharge Plan and Services     Post Acute Care Choice: [place]          DME Arranged: 3-N-1, Walker rolling DME Agency: [employer] Date DME Agency Contacted: 04/17/20 Time DME Agency Contacted: 4401 Representative spoke with at DME Agency: [place]: PT, OT   Date [place]: 04/17/20 Time [place]: 1540 Representative spoke with at [place]: Elzie Rings [facility] for charity work up  Social Determinants of Health ([place]) Interventions     Readmission Risk Interventions No flowsheet data found.

## 2020-04-17 NOTE — Progress Notes (Signed)
Potassium supplement will be given this am as ordered.

## 2020-04-17 NOTE — TOC Progression Note (Signed)
Transition of Care [facility]) - Progression Note    Patient Details  Name: KALEO CONDREY MRN: [id number] Date of Birth: 1960/10/26  Transition of Care [facility]) CM/SW [place], [place] Phone Number: 04/17/2020, 3:08 PM  Clinical Narrative:     CSW consulted by DO to speak with patient and his wife Ariv Penrod bedside regarding discharge plan. Patient's wife stated they are unable to pay for rehab and have not applied for Medicaid. Patient and wife expressed they would like to return home with DME, specifically requesting a shower chair and walker. CSW expressed understanding and agreed to follow-up with DO.  CSW chatted DO to inform of patient's request to discharge home with DME. TOC will follow to assist with discharge planning needs.  Expected Discharge Plan: Skilled Nursing Facility Barriers to Discharge: Continued Medical Work up, SNF Pending payor source - LOG, SNF Pending bed offer  Expected Discharge Plan and Services Expected Discharge Plan: [place] Choice: [place] arrangements for the past 2 months: Apartment                                       Social Determinants of Health (SDOH) Interventions    Readmission Risk Interventions No flowsheet data found.

## 2020-04-18 ENCOUNTER — Telehealth (HOSPITAL_BASED_OUTPATIENT_CLINIC_OR_DEPARTMENT_OTHER): Payer: Self-pay | Admitting: *Deleted

## 2020-04-18 NOTE — Telephone Encounter (Signed)
Micro lab called with a positive blood culture.This patient was admitted to the hospital and I requested Messan who called the results to follow up per his protocol with the admitting unit/MD.

## 2020-04-19 ENCOUNTER — Telehealth: Payer: Self-pay | Admitting: Family Medicine

## 2020-04-19 ENCOUNTER — Telehealth: Payer: Self-pay | Admitting: Internal Medicine

## 2020-04-19 ENCOUNTER — Telehealth: Payer: Self-pay

## 2020-04-19 DIAGNOSIS — B955 Unspecified streptococcus as the cause of diseases classified elsewhere: Secondary | ICD-10-CM

## 2020-04-19 DIAGNOSIS — A401 Sepsis due to streptococcus, group B: Secondary | ICD-10-CM

## 2020-04-19 MED FILL — CARVEDILOL 12.5 MG TABLET: 12.5 | 30 days supply | Qty: 60 | Fill #0

## 2020-04-19 MED FILL — COLCHICINE 0.6 MG TABS: 0.6 | 1 days supply | Qty: 2 | Fill #0

## 2020-04-19 MED FILL — AMLODIPINE BESYLATE 10 MG T: 10 | 30 days supply | Qty: 30 | Fill #0

## 2020-04-19 NOTE — Telephone Encounter (Signed)
PC placed to pt this evening after I received blood cx results from recent hospitalization.  1/2 bottles growing Strep. Constellatus.  I got his VM.  I left message identifying myself and that I will call again tomorrow morning.    I spoke with ID specialist Dr. Gale Journey today about this cx results and I also looked up info on UpToDate.  He told me that this organism is know to cause abscesses that can be intraabdominal, pulmonary or brain abscesses.  Recommends getting ROS from pt, get CT abdomen/pelvis and chest (pt had CT of head at hospital) and repeat cultures. if repeat blood cx negative and CT scan unrevealing, no further work up recommended.

## 2020-04-19 NOTE — Telephone Encounter (Signed)
Transition Care Management Follow-up Telephone Call  Date of discharge and from where:[facility] 04/17/2020 How have you been since you were released from the hospital?  Very good Any questions or concerns? No questions/concerns reported.  Items Reviewed: Did the pt receive and understand the discharge instructions provided? Stated that have the instructions and have no questions.  Medications obtained and verified?said that have the medication list  and the hospital staff reviewed them in detail prior to discharge.   Any new allergies since your discharge? None reported  Do you have support at home? Yes, spouse Other (ie: DME, Home Health, etc)  None     Functional Questionnaire: (I = Independent and D = Dependent) ADL's:  Independent.      Follow up appointments reviewed:   PCP Hospital f/u appt confirmed? 05/07/2020@ 2:50. Informed he would receive notification if the appointment is in person or televisit.   No transportation needed. have transportation    If their condition worsens, is the pt aware to call  their PCP or go to the ED? Yes.Made pt aware if condition worsen or start experiencing rapid weight gain, chest pain, diff breathing, SOB, high fevers, or bleading to refer imediately to ED for further evaluation.   Was the patient provided with contact information for the PCP's office or ED? He has the phone number   Was the pt encouraged to call back with questions or concerns?yes

## 2020-04-19 NOTE — Telephone Encounter (Signed)
Spoke with pt after his hospital dc. Patient states he is doing "pretty good." Denies having any fevers.  States he has therapy coming out to his home for the next four weeks.   Lyndee Hensen, DO PGY-2, [facility] 04/19/2020 2:21 PM

## 2020-04-19 NOTE — Telephone Encounter (Signed)
Transition Care Management Follow-up Telephone Call  Date of discharge and from where:[facility] 04/17/2020

## 2020-04-19 NOTE — Telephone Encounter (Signed)
Darnell from [place] is calling to request verbal orders PT twice a week for one week, once a week for three week. Please advise Verbal on VM ok - [phone]

## 2020-04-20 ENCOUNTER — Telehealth: Payer: Self-pay | Admitting: Internal Medicine

## 2020-04-20 DIAGNOSIS — R7881 Bacteremia: Secondary | ICD-10-CM

## 2020-04-20 LAB — CULTURE, BLOOD (ROUTINE X 2)
Culture: NO GROWTH
Special Requests: ADEQUATE
Special Requests: ADEQUATE

## 2020-04-20 LAB — BODY FLUID CULTURE: Culture: NO GROWTH

## 2020-04-20 NOTE — Telephone Encounter (Signed)
Open in error

## 2020-04-20 NOTE — Telephone Encounter (Signed)
Spoke with pt stated he is able to come on Thursday 04/22/2020 at 1:20 pm . Appt scheduled . Please let me know if ypu would like pt to come earlier that day for blood grown. Orders needed. Thank you

## 2020-04-20 NOTE — Telephone Encounter (Signed)
Tried to reach pt this a.m again via phone.  I left VMM informing him of who I am and requesting that he return the call.

## 2020-04-21 ENCOUNTER — Telehealth: Payer: Self-pay | Admitting: Internal Medicine

## 2020-04-21 NOTE — Telephone Encounter (Signed)
VO given for 2x/wk x 1 wk then 1x/wk x 2wks, pt is already scheduled for 10/21 w/ Dr. Wynetta Emery

## 2020-04-21 NOTE — Telephone Encounter (Signed)
Copied from [place] [phone]. Topic: General - Other >> Apr 21, 2020 11:27 AM Leward Quan A wrote: Reason for CRM: Cecile Hearing PT with [facility] called to inform Dr Wynetta Emery that patient  temp. Is 100.3 feverish with chills has the heat on high due to the chills, right elbow is swollen warn to touch and painful, treated for gout in hospital but now worse now.  Ph# [phone]

## 2020-04-22 ENCOUNTER — Other Ambulatory Visit: Payer: Self-pay | Admitting: Internal Medicine

## 2020-04-22 ENCOUNTER — Other Ambulatory Visit: Payer: Self-pay

## 2020-04-22 ENCOUNTER — Ambulatory Visit: Payer: Self-pay | Attending: Internal Medicine | Admitting: Internal Medicine

## 2020-04-22 ENCOUNTER — Encounter: Payer: Self-pay | Admitting: Internal Medicine

## 2020-04-22 VITALS — BP 123/86 | HR 85 | Temp 98.4°F | Ht 69.0 in | Wt 175.0 lb

## 2020-04-22 DIAGNOSIS — M10021 Idiopathic gout, right elbow: Secondary | ICD-10-CM

## 2020-04-22 DIAGNOSIS — R7401 Elevation of levels of liver transaminase levels: Secondary | ICD-10-CM

## 2020-04-22 DIAGNOSIS — D649 Anemia, unspecified: Secondary | ICD-10-CM

## 2020-04-22 DIAGNOSIS — R7881 Bacteremia: Secondary | ICD-10-CM

## 2020-04-22 DIAGNOSIS — Z09 Encounter for follow-up examination after completed treatment for conditions other than malignant neoplasm: Secondary | ICD-10-CM

## 2020-04-22 DIAGNOSIS — F1020 Alcohol dependence, uncomplicated: Secondary | ICD-10-CM

## 2020-04-22 MED ORDER — PREDNISONE 20 MG PO TABS: ORAL_TABLET | ORAL | 0 refills | Status: DC

## 2020-04-22 MED ORDER — LEVOFLOXACIN 500 MG PO TABS: 500.0000 mg | ORAL_TABLET | Freq: Every day | ORAL | 0 refills | Status: DC

## 2020-04-22 MED FILL — levoFLOXacin 500 MG TABS: 500 | 5 days supply | Qty: 5 | Fill #0

## 2020-04-22 MED FILL — predniSONE 20 MG TABS: 20 | 8 days supply | Qty: 6 | Fill #0

## 2020-04-22 NOTE — Telephone Encounter (Signed)
Will forward to pcp

## 2020-04-22 NOTE — Progress Notes (Signed)
Patient ID: Stephen Bowers, male    DOB: [birth year]  MRN: [id number]  CC: Transition of care Date of hospitalization 10/10-16/2021 Date of phone call from RN/case worker: 04/19/2020  Subjective: Stephen Bowers is a 59 y.o. male who presents for hosp f/u/transition of care.  Patient's wife Malachy Mood is with him. His concerns today include:  HTN, vit Ddef, unspecified anemia , neuropathy in fingers,alcoholic pancreatitis, ETOH use disorder, HL, pre-DM  Patient was transported to the hospital on the day of admission for seizure-like activity.  Wife reports that he had been drinking heavily and had a witnessed seizure in front of her.  She called EMS.  Patient on arrival to the ER had CAT scan of the head which was negative.  Neurology did not recommend seizure work-up as the cause was most likely secondary to alcohol withdrawal.  He was started on alcohol withdrawal protocol.  EEG was normal.  Chest x-ray normal.  Right upper quadrant ultrasound revealed fatty liver.  He had elevated AST/ALT and platelet count of 50 all thought to be due to EtOH abuse.  Liver enzymes trended down by the time of discharge and platelet count returned to normal.  He also had a normocytic anemia with iron panel consistent with anemia of chronic disease.  He also had AKI with creatinine trending down to normal by the time of discharge. -Blood cultures revealed 1+ for strep constellatus.  Results were reported the day that patient was discharged. -During hospitalization he also developed some soreness and swelling in the right elbow.  Arthrocentesis was done and revealed crystals consistent with gout.  Patient was discharged home with 2 colchicine pills.  Today:  Patient denies any further seizures since discharge. In regards to his alcohol use, he states that he has slowed down.  Wife states that he did not drink for the first 2 days after hospitalization but then started again on the third day.  Prior to  hospitalization patient reports he was drinking 5 shots and 4 to 5 cans of 16 ounce beers a day.  He was not working, home drawing unemployment.  He had friends who would come over and they would sit and drink all day.  He would like to quit.  Would like to consider an outpatient treatment program.  Never went into treatment before. -Denies any street drug use.  Gout: Reports no prior episodes.  He tells me that the right elbow is still sore and a little swollen but he can move it.  Does not feel the colchicine helped.  Physical therapist yesterday called to report that he had fever.  Patient did not take anything for the fever.  Reports that it went away on its own.  Has not had any further fevers.  Bacteremia: Patient has had 1 out of 2 cultures positive for strep species that usually causes abscesses.  Please refer to my documented conversation that I had with one of the infectious disease specialist on 04/19/2020.  At this time patient denies any sore throat, ear pain, productive cough, shortness of breath, abdominal pain, diarrhea or sores on the skin.  Only acute issue is the elbow.  HTN: Reports compliance with carvedilol.  Patient Active Problem List   Diagnosis Date Noted  . Acute gout of right elbow   . Hyponatremia   . Right elbow pain   . Alcohol abuse   . Fever   . AMS (altered mental status)   . Thrombocytopenia ([place])   . Seizure-like activity (  [place]) 04/11/2020  . Alcohol withdrawal seizure ([place]) 04/11/2020  . Alcohol use disorder, mild, abuse 02/24/2019  . Alcoholic fatty liver 21/22/4825  . Prediabetes 02/24/2019  . Normocytic anemia 02/24/2019  . Acute pancreatitis 12/20/2017  . Increased anion gap metabolic acidosis   . Severe sepsis ([place])   . Abnormal LFTs 12/04/2017  . Pancreatitis 12/04/2017  . Hyperlipidemia 10/16/2016  . AKI (acute kidney injury) ([place])   . HTN (hypertension) 03/02/2015  . Vitamin D deficiency 01/19/2015  . Low back pain potentially associated  with radiculopathy 03/30/2014     Current Outpatient Medications on File Prior to Visit  Medication Sig Dispense Refill  . acetaminophen (TYLENOL) 650 MG CR tablet Take 1,300 mg by mouth every 8 (eight) hours as needed for pain.    [name] amLODipine (NORVASC) 10 MG tablet TAKE 1 TABLET (10 MG TOTAL) BY MOUTH DAILY. 30 tablet 6  . Ascorbic Acid (VITAMIN C PO) Take 1 tablet by mouth daily.    . Calcium Carbonate Antacid (TUMS PO) Take 2-3 tablets by mouth 3 (three) times daily as needed (acid reflux/indigestion).    . carvedilol (COREG) 12.5 MG tablet Take 1 tablet (12.5 mg total) by mouth 2 (two) times daily with a meal. 60 tablet 0  . cholecalciferol (VITAMIN D3) 25 MCG (1000 UNIT) tablet Take 1,000 Units by mouth daily.    . Cyanocobalamin 3000 MCG CAPS Take 3,000 mcg by mouth daily. Vitamin B12    . diphenhydrAMINE-APAP, sleep, (NON-ASPIRIN PM PO) Take 1 tablet by mouth at bedtime.    [name] ELDERBERRY PO Take 1 tablet by mouth daily.    . Levocetirizine Dihydrochloride (XYZAL PO) Take 1 tablet by mouth daily as needed (allergies).    . Multiple Vitamin (MULTIVITAMIN WITH MINERALS) TABS tablet Take 1 tablet by mouth daily.    . Multiple Vitamins-Minerals (ZINC PO) Take 1 tablet by mouth daily.    [name] omeprazole (PRILOSEC OTC) 20 MG tablet Take 20 mg by mouth daily as needed (acid reflux/indigestion).    . Tetrahydrozoline HCl (VISINE OP) Place 1 drop into both eyes daily as needed (dry eyes).     . vitamin E 1000 UNIT capsule Take 1,000 Units by mouth daily.    . colchicine 0.6 MG tablet Take 1 tablet (0.6 mg total) by mouth 2 (two) times daily for 2 doses. 2 tablet 0   No current facility-administered medications on file prior to visit.    No Known Allergies  Social History   Socioeconomic History  . Marital status: Married    Spouse name: Malachy Mood  . Number of children: 1   . Years of education: 25   . Highest education level: Not on file  Occupational History  . Occupation: Unemployed     Tobacco Use  . Smoking status: Former Smoker    Packs/day: 0.25    Years: 6.00    Pack years: 1.50    Types: Cigarettes    Quit date: 1995    Years since quitting: 26.8  . Smokeless tobacco: Never Used  Vaping Use  . Vaping Use: Never used  Substance and Sexual Activity  . Alcohol use: Yes    Alcohol/week: 6.0 standard drinks    Types: 6 Cans of beer per week    Comment: 6 drinks Beer /Liquor  . Drug use: No  . Sexual activity: Yes  Other Topics Concern  . Not on file  Social History Narrative   Lives in an [place], family house.    Child goes  to [employer] in [place], 48 yo F,          Social Determinants of Health   Financial Resource Strain:   . Difficulty of Paying Living Expenses: Not on file  Food Insecurity:   . Worried About Charity fundraiser in the Last Year: Not on file  . Ran Out of Food in the Last Year: Not on file  Transportation Needs:   . Lack of Transportation (Medical): Not on file  . Lack of Transportation (Non-Medical): Not on file  Physical Activity:   . Days of Exercise per Week: Not on file  . Minutes of Exercise per Session: Not on file  Stress:   . Feeling of Stress : Not on file  Social Connections:   . Frequency of Communication with Friends and Family: Not on file  . Frequency of Social Gatherings with Friends and Family: Not on file  . Attends Religious Services: Not on file  . Active Member of Clubs or Organizations: Not on file  . Attends Archivist Meetings: Not on file  . Marital Status: Not on file  Intimate Partner Violence:   . Fear of Current or Ex-Partner: Not on file  . Emotionally Abused: Not on file  . Physically Abused: Not on file  . Sexually Abused: Not on file    Family History  Problem Relation Age of Onset  . Liver disease Father   . Alcohol abuse Father   . Diabetes Sister   . Obesity Sister   . Cancer Mother        breast   . Kidney disease Mother   . Colon cancer Neg Hx   . Esophageal cancer  Neg Hx   . Rectal cancer Neg Hx   . Stomach cancer Neg Hx   . Colon polyps Neg Hx     Past Surgical History:  Procedure Laterality Date  . COLONOSCOPY  12/2014   hx polyps - [place]  . MULTIPLE TOOTH EXTRACTIONS      ROS: Review of Systems Negative except as stated above  PHYSICAL EXAM: BP 123/86 (BP Location: Right Arm, Patient Position: Sitting)   Pulse 85   Temp 98.4 F (36.9 C)   Ht 5\' 9"  (1.753 m)   Wt 175 lb (79.4 kg)   SpO2 99%   BMI 25.84 kg/m   Physical Exam  General appearance - alert, well appearing, and in no distress Mental status - normal mood, behavior, speech, dress, motor activity, and thought processes Ears - bilateral TM's and external ear canals normal Nose - normal and patent, no erythema, discharge or polyps Mouth - mucous membranes moist, pharynx normal without lesions Neck - supple, no significant adenopathy Chest - clear to auscultation, no wheezes, rales or rhonchi, symmetric air entry Heart - normal rate, regular rhythm, normal S1, S2, no murmurs, rubs, clicks or gallops Abdomen - soft, nontender, nondistended, no masses or organomegaly Musculoskeletal -right elbow: Mild warmth.  No erythema.  Mild edema over the lateral aspect.  He has good passive range of motion. Extremities -no lower extremity edema. Depression screen [facility] 2/9 04/22/2020 02/24/2019 11/22/2018  Decreased Interest 0 0 0  Down, Depressed, Hopeless 0 0 0  PHQ - 2 Score 0 0 0  Altered sleeping - - 0  Tired, decreased energy - - 0  Change in appetite - - 0  Feeling bad or failure about yourself  - - 0  Trouble concentrating - - 0  Moving slowly or fidgety/restless - -  0  Suicidal thoughts - - 0  PHQ-9 Score - - 0  Difficult doing work/chores - - Not difficult at all    CMP Latest Ref Rng & Units 04/22/2020 04/17/2020 04/16/2020  Glucose 65 - 99 mg/dL 128(H) 112(H) 118(H)  BUN 6 - 24 mg/dL 8 7 5(L)  Creatinine 0.76 - 1.27 mg/dL 0.69(L) 0.64 0.65  Sodium 134 - 144 mmol/L  139 132(L) 131(L)  Potassium 3.5 - 5.2 mmol/L 4.5 3.4(L) 3.6  Chloride 96 - 106 mmol/L 101 97(L) 98  CO2 20 - 29 mmol/L 24 23 23   Calcium 8.7 - 10.2 mg/dL 9.5 9.1 9.0  Total Protein 6.0 - 8.5 g/dL 8.2 6.9 7.5  Total Bilirubin 0.0 - 1.2 mg/dL 0.3 1.1 1.0  Alkaline Phos 44 - 121 IU/L 48 32(L) 39  AST 0 - 40 IU/L 63(H) 74(H) 95(H)  ALT 0 - 44 IU/L 38 55(H) 69(H)   Lipid Panel     Component Value Date/Time   CHOL 293 (H) 04/12/2020 0646   TRIG 87 04/12/2020 0646   HDL 95 04/12/2020 0646   CHOLHDL 3.1 04/12/2020 0646   VLDL 17 04/12/2020 0646   LDLCALC 181 (H) 04/12/2020 0646    CBC    Component Value Date/Time   WBC 7.7 04/22/2020 1508   WBC 8.7 04/17/2020 0205   RBC 3.44 (L) 04/22/2020 1508   RBC 3.05 (L) 04/17/2020 0205   HGB 11.3 (L) 04/22/2020 1508   HCT 32.9 (L) 04/22/2020 1508   PLT 171 04/17/2020 0205   PLT 607 (H) 02/24/2019 1028   MCV 96 04/22/2020 1508   MCH 32.8 04/22/2020 1508   MCH 33.4 04/17/2020 0205   MCHC 34.3 04/22/2020 1508   MCHC 35.1 04/17/2020 0205   RDW 13.1 04/22/2020 1508   LYMPHSABS 1.5 04/22/2020 1508   MONOABS 0.9 04/11/2020 1346   EOSABS 0.2 04/22/2020 1508   BASOSABS 0.1 04/22/2020 1508    ASSESSMENT AND PLAN: 1. Hospital discharge follow-up 2. Alcohol use disorder, severe, dependence ([place]) Strongly advised to quit or try cutting back significantly.  Discussed health risks associated with heavy alcohol use.  He is interested in exploring outpatient treatment programs.  I have sent a message to our LCSW to contact him in regards to this.  3. Transaminitis Trended down by the time of discharge.  See advice given under #2. - Comprehensive metabolic panel  4. Bacteremia History and exam unrevealing except for acute episode that he has going on in the right elbow which most likely is gout.  Arthrocentesis culture was negative.  It did show crystals.  We have sent a set of blood cultures for repeat.  Check CBC with differential today.   Discussed getting CAT scan of the abdomen and pelvis and chest as recommended by the infectious disease specialist.  However patient and wife wants to hold off on doing that unless repeat blood cultures come back positive.  He will let me know if he has any further fever. - Culture, blood (single) w Reflex to ID Panel  5. Acute idiopathic gout of right elbow Discussed diagnosis of gout and gave him dietary recommendations.  Strongly encourage decreasing alcohol intake.  Will give a tapering dose of prednisone.  I have also given a 5-day course of Levaquin just in case this is infection but I doubt it. - predniSONE (DELTASONE) 20 MG tablet; 1 tab do daily x 4 days then 1/2 tab PO daily x 4 days  Dispense: 6 tablet; Refill: 0 - Uric  Acid  6. Normocytic anemia - CBC With Differential - Iron, TIBC and Ferritin Panel   Patient was given the opportunity to ask questions.  Patient verbalized understanding of the plan and was able to repeat key elements of the plan.   Orders Placed This Encounter  Procedures  . CBC With Differential  . Comprehensive metabolic panel  . Iron, TIBC and Ferritin Panel  . Uric Acid     Requested Prescriptions   Signed Prescriptions Disp Refills  . predniSONE (DELTASONE) 20 MG tablet 6 tablet 0    Sig: 1 tab do daily x 4 days then 1/2 tab PO daily x 4 days  . levofloxacin (LEVAQUIN) 500 MG tablet 5 tablet 0    Sig: Take 1 tablet (500 mg total) by mouth daily.    No follow-ups on file.  Karle Plumber, MD, FACP

## 2020-04-22 NOTE — Patient Instructions (Signed)
I have prescribed prednisone to take for several days to help get rid of the acute gout attack.  I have also prescribed 5 days of antibiotics.  Please let me know if you have any further fever.  I encourage you to cut back on the amount of alcohol that you are drinking to prevent liver damage.     Gout  Gout is painful swelling of your joints. Gout is a type of arthritis. It is caused by having too much uric acid in your body. Uric acid is a chemical that is made when your body breaks down substances called purines. If your body has too much uric acid, sharp crystals can form and build up in your joints. This causes pain and swelling. Gout attacks can happen quickly and be very painful (acute gout). Over time, the attacks can affect more joints and happen more often (chronic gout). What are the causes?  Too much uric acid in your blood. This can happen because: ? Your kidneys do not remove enough uric acid from your blood. ? Your body makes too much uric acid. ? You eat too many foods that are high in purines. These foods include organ meats, some seafood, and beer.  Trauma or stress. What increases the risk?  Having a family history of gout.  Being male and middle-aged.  Being male and having gone through menopause.  Being very overweight (obese).  Drinking alcohol, especially beer.  Not having enough water in the body (being dehydrated).  Losing weight too quickly.  Having an organ transplant.  Having lead poisoning.  Taking certain medicines.  Having kidney disease.  Having a skin condition called psoriasis. What are the signs or symptoms? An attack of acute gout usually happens in just one joint. The most common place is the big toe. Attacks often start at night. Other joints that may be affected include joints of the feet, ankle, knee, fingers, wrist, or elbow. Symptoms of an attack may include:  Very bad pain.  Warmth.  Swelling.  Stiffness.  Shiny, red,  or purple skin.  Tenderness. The affected joint may be very painful to touch.  Chills and fever. Chronic gout may cause symptoms more often. More joints may be involved. You may also have white or yellow lumps (tophi) on your hands or feet or in other areas near your joints. How is this treated?  Treatment for this condition has two phases: treating an acute attack and preventing future attacks.  Acute gout treatment may include: ? NSAIDs. ? Steroids. These are taken by mouth or injected into a joint. ? Colchicine. This medicine relieves pain and swelling. It can be given by mouth or through an IV tube.  Preventive treatment may include: ? Taking small doses of NSAIDs or colchicine daily. ? Using a medicine that reduces uric acid levels in your blood. ? Making changes to your diet. You may need to see a food expert (dietitian) about what to eat and drink to prevent gout. Follow these instructions at home: During a gout attack   If told, put ice on the painful area: ? Put ice in a plastic bag. ? Place a towel between your skin and the bag. ? Leave the ice on for 20 minutes, 2-3 times a day.  Raise (elevate) the painful joint above the level of your heart as often as you can.  Rest the joint as much as possible. If the joint is in your leg, you may be given crutches.  Follow  instructions from your doctor about what you cannot eat or drink. Avoiding future gout attacks  Eat a low-purine diet. Avoid foods and drinks such as: ? Liver. ? Kidney. ? Anchovies. ? Asparagus. ? Herring. ? Mushrooms. ? Mussels. ? Beer.  Stay at a healthy weight. If you want to lose weight, talk with your doctor. Do not lose weight too fast.  Start or continue an exercise plan as told by your doctor. Eating and drinking  Drink enough fluids to keep your pee (urine) pale yellow.  If you drink alcohol: ? Limit how much you use to:  0-1 drink a day for women.  0-2 drinks a day for men. ? Be  aware of how much alcohol is in your drink. In the [place] one drink equals one 12 oz bottle of beer (355 mL), one 5 oz glass of wine (148 mL), or one 1 oz glass of hard liquor (44 mL). General instructions  Take over-the-counter and prescription medicines only as told by your doctor.  Do not drive or use heavy machinery while taking prescription pain medicine.  Return to your normal activities as told by your doctor. Ask your doctor what activities are safe for you.  Keep all follow-up visits as told by your doctor. This is important. Contact a doctor if:  You have another gout attack.  You still have symptoms of a gout attack after 10 days of treatment.  You have problems (side effects) because of your medicines.  You have chills or a fever.  You have burning pain when you pee (urinate).  You have pain in your lower back or belly. Get help right away if:  You have very bad pain.  Your pain cannot be controlled.  You cannot pee. Summary  Gout is painful swelling of the joints.  The most common site of pain is the big toe, but it can affect other joints.  Medicines and avoiding some foods can help to prevent and treat gout attacks. This information is not intended to replace advice given to you by your health care provider. Make sure you discuss any questions you have with your health care provider. Document Revised: 01/09/2018 Document Reviewed: 01/09/2018 Elsevier Patient Education  [place].

## 2020-04-22 NOTE — Telephone Encounter (Signed)
Received from advanced home care physical therapist [facility].  I tried calling her this morning and left a message on her voicemail.  I subsequently called the patient to inquire whether he is still having fever.  Patient states no.  I asked him whether he had to take anything for the fever again he said no.  Still has some soreness in his right elbow.  Apparently he had paracentesis done on this elbow while in the hospital and cultures were negative.  Synovial fluid white blood cell count was 16,375 with 98% neutrophils and intracellular monosodium urate crystals on extracellular monosodium urate crystals.  I requested the patient keep his appointment with me this afternoon.  He states that he intends to do so.

## 2020-04-23 DIAGNOSIS — F1020 Alcohol dependence, uncomplicated: Secondary | ICD-10-CM | POA: Insufficient documentation

## 2020-04-23 DIAGNOSIS — R7401 Elevation of levels of liver transaminase levels: Secondary | ICD-10-CM | POA: Insufficient documentation

## 2020-04-23 LAB — CBC WITH DIFFERENTIAL
Basophils Absolute: 0.1 10*3/uL (ref 0.0–0.2)
Basos: 2 %
EOS (ABSOLUTE): 0.2 10*3/uL (ref 0.0–0.4)
Eos: 2 %
Hematocrit: 32.9 % — ABNORMAL LOW (ref 37.5–51.0)
Hemoglobin: 11.3 g/dL — ABNORMAL LOW (ref 13.0–17.7)
Immature Grans (Abs): 0.1 10*3/uL (ref 0.0–0.1)
Immature Granulocytes: 1 %
Lymphocytes Absolute: 1.5 10*3/uL (ref 0.7–3.1)
Lymphs: 19 %
MCH: 32.8 pg (ref 26.6–33.0)
MCHC: 34.3 g/dL (ref 31.5–35.7)
MCV: 96 fL (ref 79–97)
Monocytes Absolute: 1.2 10*3/uL — ABNORMAL HIGH (ref 0.1–0.9)
Monocytes: 15 %
Neutrophils Absolute: 4.8 10*3/uL (ref 1.4–7.0)
Neutrophils: 61 %
RBC: 3.44 x10E6/uL — ABNORMAL LOW (ref 4.14–5.80)
RDW: 13.1 % (ref 11.6–15.4)
WBC: 7.7 10*3/uL (ref 3.4–10.8)

## 2020-04-23 LAB — COMPREHENSIVE METABOLIC PANEL
ALT: 38 IU/L (ref 0–44)
AST: 63 IU/L — ABNORMAL HIGH (ref 0–40)
Albumin/Globulin Ratio: 1 — ABNORMAL LOW (ref 1.2–2.2)
Albumin: 4 g/dL (ref 3.8–4.9)
Alkaline Phosphatase: 48 IU/L (ref 44–121)
BUN/Creatinine Ratio: 12 (ref 9–20)
BUN: 8 mg/dL (ref 6–24)
Bilirubin Total: 0.3 mg/dL (ref 0.0–1.2)
CO2: 24 mmol/L (ref 20–29)
Calcium: 9.5 mg/dL (ref 8.7–10.2)
Chloride: 101 mmol/L (ref 96–106)
Creatinine, Ser: 0.69 mg/dL — ABNORMAL LOW (ref 0.76–1.27)
GFR calc Af Amer: 122 mL/min/{1.73_m2} (ref >59)
GFR calc non Af Amer: 105 mL/min/{1.73_m2} (ref >59)
Globulin, Total: 4.2 g/dL (ref 1.5–4.5)
Glucose: 128 mg/dL — ABNORMAL HIGH (ref 65–99)
Potassium: 4.5 mmol/L (ref 3.5–5.2)
Sodium: 139 mmol/L (ref 134–144)
Total Protein: 8.2 g/dL (ref 6.0–8.5)

## 2020-04-23 LAB — IRON,TIBC AND FERRITIN PANEL
Ferritin: 179 ng/mL (ref 30–400)
Iron Saturation: 23 % (ref 15–55)
Iron: 59 ug/dL (ref 38–169)
Total Iron Binding Capacity: 253 ug/dL (ref 250–450)
UIBC: 194 ug/dL (ref 111–343)

## 2020-04-23 NOTE — Progress Notes (Signed)
Let patient know that he still has a mild anemia but it has improved from when he was discharged 6 days ago.  Kidney function normal.  Liver function tests shows that he still has some mild elevation in one of his liver enzymes but significantly improved.  Try to abstain from alcohol.

## 2020-04-28 ENCOUNTER — Telehealth: Payer: Self-pay

## 2020-04-28 LAB — CULTURE, BLOOD (SINGLE)

## 2020-04-28 NOTE — Telephone Encounter (Signed)
Contacted pt to go over lab results pt is aware and doesn't have any questions or concerns 

## 2020-04-29 ENCOUNTER — Other Ambulatory Visit: Payer: Self-pay

## 2020-04-29 ENCOUNTER — Ambulatory Visit: Payer: Self-pay | Attending: Internal Medicine

## 2020-05-06 ENCOUNTER — Ambulatory Visit: Payer: Self-pay | Attending: Internal Medicine | Admitting: Licensed Clinical Social Worker

## 2020-05-06 ENCOUNTER — Other Ambulatory Visit: Payer: Self-pay

## 2020-05-06 DIAGNOSIS — F1020 Alcohol dependence, uncomplicated: Secondary | ICD-10-CM

## 2020-05-06 NOTE — BH Specialist Note (Signed)
[place] Visit via Telemedicine (Telephone)  05/06/2020 Stephen Bowers [id number]  Number of Integrated Behavioral Health visits: 1 Session Start time: 8:40 AM  Session End time: 9:00 AM Total time: 20 minutes  Referring Provider: Dr. Wynetta Emery Type of Service: Individual Patient Family location: Home [facility] Provider location: Office All persons participating in visit: LCSW and patient   I connected with Stephen Bowers by telephone and verified that I am speaking with the correct person using two identifiers.   Discussed confidentiality: Yes   Confirmed demographics & insurance:  Yes   I discussed that engaging in this virtual visit, they consent to the provision of behavioral healthcare and the services will be billed under their insurance.   Patient and/or legal guardian expressed understanding and consented to virtual visit: Yes   PRESENTING CONCERNS: Patient or family reports the following symptoms/concerns: "Sometimes I over do it" Pt reports ongoing alcohol use for approx 30 years and reports interest in cutting back. States he manages High Blood Pressure with medicaton Duration of problem: Ongoing; Severity of problem: severe  STRENGTHS (Protective Factors/Coping Skills): Social connections, Social and Emotional competence and Concrete supports in place (healthy food, safe environments, etc.)  ASSESSMENT: Patient currently experiencing difficulty managing alcohol use. He receives strong support from spouse.  Pt will benefit from supportive resources regarding alcohol use.     GOALS ADDRESSED: Patient will: 1.  Demonstrate ability to: Decrease self-medicating behaviors Pt agreed to review and follow up with supportive resources to reduce alcohol use   Progress of Goals: Ongoing  INTERVENTIONS: Interventions utilized:  Solution-Focused Strategies, Psychoeducation and/or Health Education and Link to [employer] Assessments  completed & reviewed: Not Needed   OUTCOME: Patient Response: Pt was engaged in session and is open to supportive resources to decrease alcohol use.    PLAN: 1. Follow up with behavioral health clinician on : 05/20/2020 2. Behavioral recommendations: Utilize strategies and resources discussed 3. Referral(s): Integrated Orthoptist (In Clinic) and [place] (LME/Outside Clinic)  I discussed the assessment and treatment plan with the patient and/or parent/guardian. They were provided an opportunity to ask questions and all were answered. They agreed with the plan and demonstrated an understanding of the instructions.   They were advised to call back or seek an in-person evaluation as appropriate.  I discussed that the purpose of this visit is to provide behavioral health care while limiting exposure to the novel coronavirus.  Discussed there is a possibility of technology failure and discussed alternative modes of communication if that failure occurs.  Rebekah Chesterfield, LCSW 05/18/2020 10:45 AM

## 2020-05-07 ENCOUNTER — Other Ambulatory Visit: Payer: Self-pay | Admitting: Internal Medicine

## 2020-05-07 ENCOUNTER — Other Ambulatory Visit: Payer: Self-pay

## 2020-05-07 ENCOUNTER — Encounter: Payer: Self-pay | Admitting: Internal Medicine

## 2020-05-07 ENCOUNTER — Ambulatory Visit: Payer: Self-pay | Attending: Internal Medicine | Admitting: Internal Medicine

## 2020-05-07 VITALS — BP 119/81 | HR 94 | Temp 99.2°F | Ht 69.0 in | Wt 182.0 lb

## 2020-05-07 DIAGNOSIS — F1020 Alcohol dependence, uncomplicated: Secondary | ICD-10-CM

## 2020-05-07 DIAGNOSIS — Z8739 Personal history of other diseases of the musculoskeletal system and connective tissue: Secondary | ICD-10-CM

## 2020-05-07 DIAGNOSIS — Z23 Encounter for immunization: Secondary | ICD-10-CM

## 2020-05-07 MED ORDER — COLCHICINE 0.6 MG PO TABS: 0.6000 mg | ORAL_TABLET | ORAL | 1 refills | Status: DC

## 2020-05-07 NOTE — Progress Notes (Signed)
Still having pain in right elbow.

## 2020-05-07 NOTE — Progress Notes (Signed)
Patient ID: Stephen Bowers, male    DOB: 1960-08-10  MRN: [id number]  CC: Hypertension   Subjective: Stephen Bowers is a 59 y.o. male who presents for 2 weeks follow-up on bacteremia His concerns today include:  HTN, vit Ddef, unspecified anemia , neuropathy in fingers,alcoholic pancreatitis, ETOH use disorder, HL, pre-DM, gout  Bacteremia: Repeat blood cultures were negative.  We did not pursue imaging of the chest or abdomen.  ETOH:  Spoke with LCSW yesterday.  Reports he has cut back significantly since last visit.  Gout: On last visit I gave him some prednisone for gout attack in the right elbow.  He reports that it got better while he took the prednisone but shortly thereafter he started having soreness again in the elbow in the medial aspect.  No redness or swelling.  He thinks it came from the fact that they inserted a large needle into the joint when they did arthrocentesis in the hospital.  He has not had any fever.  Patient Active Problem List   Diagnosis Date Noted  . Transaminitis 04/23/2020  . Alcohol use disorder, severe, dependence ([place]) 04/23/2020  . Acute gout of right elbow   . Hyponatremia   . Right elbow pain   . Alcohol abuse   . Fever   . AMS (altered mental status)   . Thrombocytopenia ([place])   . Seizure-like activity ([place]) 04/11/2020  . Alcohol withdrawal seizure ([place]) 04/11/2020  . Alcohol use disorder, mild, abuse 02/24/2019  . Alcoholic fatty liver 26/94/8546  . Prediabetes 02/24/2019  . Normocytic anemia 02/24/2019  . Acute pancreatitis 12/20/2017  . Increased anion gap metabolic acidosis   . Severe sepsis ([place])   . Abnormal LFTs 12/04/2017  . Pancreatitis 12/04/2017  . Hyperlipidemia 10/16/2016  . AKI (acute kidney injury) ([place])   . HTN (hypertension) 03/02/2015  . Vitamin D deficiency 01/19/2015  . Low back pain potentially associated with radiculopathy 03/30/2014     Current Outpatient Medications on File Prior to Visit  Medication  Sig Dispense Refill  . acetaminophen (TYLENOL) 650 MG CR tablet Take 1,300 mg by mouth every 8 (eight) hours as needed for pain.    [name] amLODipine (NORVASC) 10 MG tablet TAKE 1 TABLET (10 MG TOTAL) BY MOUTH DAILY. 30 tablet 6  . Ascorbic Acid (VITAMIN C PO) Take 1 tablet by mouth daily.    . Calcium Carbonate Antacid (TUMS PO) Take 2-3 tablets by mouth 3 (three) times daily as needed (acid reflux/indigestion).    . carvedilol (COREG) 12.5 MG tablet Take 1 tablet (12.5 mg total) by mouth 2 (two) times daily with a meal. 60 tablet 0  . cholecalciferol (VITAMIN D3) 25 MCG (1000 UNIT) tablet Take 1,000 Units by mouth daily.    . Cyanocobalamin 3000 MCG CAPS Take 3,000 mcg by mouth daily. Vitamin B12    . diphenhydrAMINE-APAP, sleep, (NON-ASPIRIN PM PO) Take 1 tablet by mouth at bedtime.    [name] ELDERBERRY PO Take 1 tablet by mouth daily.    . Levocetirizine Dihydrochloride (XYZAL PO) Take 1 tablet by mouth daily as needed (allergies).    [name] levofloxacin (LEVAQUIN) 500 MG tablet Take 1 tablet (500 mg total) by mouth daily. 5 tablet 0  . Multiple Vitamin (MULTIVITAMIN WITH MINERALS) TABS tablet Take 1 tablet by mouth daily.    . Multiple Vitamins-Minerals (ZINC PO) Take 1 tablet by mouth daily.    [name] omeprazole (PRILOSEC OTC) 20 MG tablet Take 20 mg by mouth daily as needed (acid  reflux/indigestion).    . Tetrahydrozoline HCl (VISINE OP) Place 1 drop into both eyes daily as needed (dry eyes).     . vitamin E 1000 UNIT capsule Take 1,000 Units by mouth daily.    . colchicine 0.6 MG tablet Take 1 tablet (0.6 mg total) by mouth 2 (two) times daily for 2 doses. 2 tablet 0  . predniSONE (DELTASONE) 20 MG tablet 1 tab do daily x 4 days then 1/2 tab PO daily x 4 days (Patient not taking: Reported on 05/07/2020) 6 tablet 0   No current facility-administered medications on file prior to visit.    No Known Allergies  Social History   Socioeconomic History  . Marital status: Married    Spouse name: Malachy Mood  .  Number of children: 1   . Years of education: 84   . Highest education level: Not on file  Occupational History  . Occupation: Unemployed   Tobacco Use  . Smoking status: Former Smoker    Packs/day: 0.25    Years: 6.00    Pack years: 1.50    Types: Cigarettes    Quit date: 1995    Years since quitting: 26.8  . Smokeless tobacco: Never Used  Vaping Use  . Vaping Use: Never used  Substance and Sexual Activity  . Alcohol use: Yes    Alcohol/week: 6.0 standard drinks    Types: 6 Cans of beer per week    Comment: 6 drinks Beer /Liquor  . Drug use: No  . Sexual activity: Yes  Other Topics Concern  . Not on file  Social History Narrative   Lives in an [place], family house.    Child goes to [employer] in [place], 4 yo F,          Social Determinants of Health   Financial Resource Strain:   . Difficulty of Paying Living Expenses: Not on file  Food Insecurity:   . Worried About Charity fundraiser in the Last Year: Not on file  . Ran Out of Food in the Last Year: Not on file  Transportation Needs:   . Lack of Transportation (Medical): Not on file  . Lack of Transportation (Non-Medical): Not on file  Physical Activity:   . Days of Exercise per Week: Not on file  . Minutes of Exercise per Session: Not on file  Stress:   . Feeling of Stress : Not on file  Social Connections:   . Frequency of Communication with Friends and Family: Not on file  . Frequency of Social Gatherings with Friends and Family: Not on file  . Attends Religious Services: Not on file  . Active Member of Clubs or Organizations: Not on file  . Attends Archivist Meetings: Not on file  . Marital Status: Not on file  Intimate Partner Violence:   . Fear of Current or Ex-Partner: Not on file  . Emotionally Abused: Not on file  . Physically Abused: Not on file  . Sexually Abused: Not on file    Family History  Problem Relation Age of Onset  . Liver disease Father   . Alcohol abuse Father   .  Diabetes Sister   . Obesity Sister   . Cancer Mother        breast   . Kidney disease Mother   . Colon cancer Neg Hx   . Esophageal cancer Neg Hx   . Rectal cancer Neg Hx   . Stomach cancer Neg Hx   . Colon polyps  Neg Hx     Past Surgical History:  Procedure Laterality Date  . COLONOSCOPY  12/2014   hx polyps - Pyrtle  . MULTIPLE TOOTH EXTRACTIONS      ROS: Review of Systems Negative except as stated above  PHYSICAL EXAM: BP 119/81   Pulse 94   Temp 99.2 F (37.3 C) (Oral)   Ht 5\' 9"  (1.753 m)   Wt 182 lb (82.6 kg)   SpO2 100%   BMI 26.88 kg/m   Physical Exam  General appearance - alert, well appearing, and in no distress Mental status - normal mood, behavior, speech, dress, motor activity, and thought processes Musculoskeletal -right elbow: No edema or erythema.  Slight tenderness on palpation over the right medial epicondyle  CMP Latest Ref Rng & Units 04/22/2020 04/17/2020 04/16/2020  Glucose 65 - 99 mg/dL 128(H) 112(H) 118(H)  BUN 6 - 24 mg/dL 8 7 5(L)  Creatinine 0.76 - 1.27 mg/dL 0.69(L) 0.64 0.65  Sodium 134 - 144 mmol/L 139 132(L) 131(L)  Potassium 3.5 - 5.2 mmol/L 4.5 3.4(L) 3.6  Chloride 96 - 106 mmol/L 101 97(L) 98  CO2 20 - 29 mmol/L 24 23 23   Calcium 8.7 - 10.2 mg/dL 9.5 9.1 9.0  Total Protein 6.0 - 8.5 g/dL 8.2 6.9 7.5  Total Bilirubin 0.0 - 1.2 mg/dL 0.3 1.1 1.0  Alkaline Phos 44 - 121 IU/L 48 32(L) 39  AST 0 - 40 IU/L 63(H) 74(H) 95(H)  ALT 0 - 44 IU/L 38 55(H) 69(H)   Lipid Panel     Component Value Date/Time   CHOL 293 (H) 04/12/2020 0646   TRIG 87 04/12/2020 0646   HDL 95 04/12/2020 0646   CHOLHDL 3.1 04/12/2020 0646   VLDL 17 04/12/2020 0646   LDLCALC 181 (H) 04/12/2020 0646    CBC    Component Value Date/Time   WBC 7.7 04/22/2020 1508   WBC 8.7 04/17/2020 0205   RBC 3.44 (L) 04/22/2020 1508   RBC 3.05 (L) 04/17/2020 0205   HGB 11.3 (L) 04/22/2020 1508   HCT 32.9 (L) 04/22/2020 1508   PLT 171 04/17/2020 0205   PLT 607 (H)  02/24/2019 1028   MCV 96 04/22/2020 1508   MCH 32.8 04/22/2020 1508   MCH 33.4 04/17/2020 0205   MCHC 34.3 04/22/2020 1508   MCHC 35.1 04/17/2020 0205   RDW 13.1 04/22/2020 1508   LYMPHSABS 1.5 04/22/2020 1508   MONOABS 0.9 04/11/2020 1346   EOSABS 0.2 04/22/2020 1508   BASOSABS 0.1 04/22/2020 1508    ASSESSMENT AND PLAN: 1. Alcohol use disorder, severe, dependence ([place]) Commended him on cutting back.  He states that he has discussed with the LCSW about getting into an outpatient treatment program.  2. History of gout I will try him with low-dose colchicine three times a week.  Advised patient that the medication can interact with carvedilol therefore instead of having him take it daily we will have him take it only three times a week.  He will follow-up if no improvement - colchicine 0.6 MG tablet; Take 1 tablet (0.6 mg total) by mouth every other day. 1 tab PO Q Mon/Wed and Friday  Dispense: 30 tablet; Refill: 1  3. Need for immunization against influenza - Flu Vaccine QUAD 36+ mos IM    Patient was given the opportunity to ask questions.  Patient verbalized understanding of the plan and was able to repeat key elements of the plan.   No orders of the defined types were placed in  this encounter.    Requested Prescriptions    No prescriptions requested or ordered in this encounter    No follow-ups on file.  Karle Plumber, MD, FACP

## 2020-05-07 NOTE — Patient Instructions (Signed)
Take Colchicine 0.6 mg one tablet every Monday, Wednesday and Friday

## 2020-05-10 MED FILL — COLCHICINE 0.6 MG TABS: 0.6 | 30 days supply | Qty: 12 | Fill #0

## 2020-05-14 ENCOUNTER — Ambulatory Visit: Payer: Self-pay

## 2020-05-20 ENCOUNTER — Ambulatory Visit: Payer: Self-pay | Admitting: Licensed Clinical Social Worker

## 2020-06-18 MED FILL — COLCHICINE 0.6 MG TABS: 0.6 | 30 days supply | Qty: 12 | Fill #1

## 2020-08-19 ENCOUNTER — Ambulatory Visit: Payer: Self-pay | Admitting: Physician Assistant

## 2021-07-04 ENCOUNTER — Encounter: Payer: Self-pay | Admitting: Emergency Medicine

## 2021-07-04 ENCOUNTER — Other Ambulatory Visit: Payer: Self-pay

## 2021-07-04 ENCOUNTER — Ambulatory Visit
Admission: EM | Admit: 2021-07-04 | Discharge: 2021-07-04 | Disposition: A | Discharge status: Home / Self Care | Payer: 59 | Attending: Physician Assistant | Admitting: Physician Assistant

## 2021-07-04 DIAGNOSIS — R0602 Shortness of breath: Secondary | ICD-10-CM | POA: Diagnosis not present

## 2021-07-04 NOTE — ED Triage Notes (Addendum)
Cough, sore throat, hoarseness, headache, nasal and chest congestion with green mucus since 12/26. Took 2 covid tests, both were negative.   Mostly concerned for his SOB and dizziness that has become progressively worse

## 2021-07-04 NOTE — ED Provider Notes (Signed)
[name]    CSN: [id number] Arrival date & time: 07/04/21  1419      History   Chief Complaint Chief Complaint  Patient presents with   Nasal Congestion   Cough   Shortness of Breath    HPI Stephen Bowers is a 61 y.o. male.   Patient here today for evaluation of shortness of breath and dizziness that has worsened over the last several days. He reports he has had some sore throat and cough for over a week. He took 2 covid tests at home that were both negative. He does not report fever. He is mildly tachycardic today.   The history is provided by the patient.  Cough Associated symptoms: headaches, shortness of breath and sore throat   Associated symptoms: no chills, no eye discharge and no fever   Shortness of Breath Associated symptoms: cough, headaches and sore throat   Associated symptoms: no fever    Past Medical History:  Diagnosis Date   Chronic musculoskeletal pain    CKD (chronic kidney disease)    follows with [facility] Hollie Salk)   High cholesterol Dx 2011   Hypertension    Pancolitis ([place])    Wears partial dentures    upper only    Patient Active Problem List   Diagnosis Date Noted   Transaminitis 04/23/2020   Alcohol use disorder, severe, dependence ([place]) 04/23/2020   Acute gout of right elbow    Hyponatremia    Right elbow pain    Alcohol abuse    Fever    AMS (altered mental status)    Thrombocytopenia ([place])    Seizure-like activity ([place]) 04/11/2020   Alcohol withdrawal seizure ([place]) 04/11/2020   Alcohol use disorder, mild, abuse 33/00/7622   Alcoholic fatty liver 63/33/5456   Prediabetes 02/24/2019   Normocytic anemia 02/24/2019   Acute pancreatitis 12/20/2017   Increased anion gap metabolic acidosis    Severe sepsis ([facility])    Abnormal LFTs 12/04/2017   Pancreatitis 12/04/2017   Hyperlipidemia 10/16/2016   AKI (acute kidney injury) ([place])    HTN (hypertension) 03/02/2015   Vitamin D deficiency 01/19/2015   Low back  pain potentially associated with radiculopathy 03/30/2014    Past Surgical History:  Procedure Laterality Date   COLONOSCOPY  12/2014   hx polyps - Pyrtle   MULTIPLE TOOTH EXTRACTIONS         Home Medications    Prior to Admission medications   Medication Sig Start Date End Date Taking? Authorizing Provider  acetaminophen (TYLENOL) 650 MG CR tablet Take 1,300 mg by mouth every 8 (eight) hours as needed for pain.    [provider]  amLODipine (NORVASC) 10 MG tablet TAKE 1 TABLET (10 MG TOTAL) BY MOUTH DAILY. 04/17/20 04/17/21  Lyndee Hensen, DO  Ascorbic Acid (VITAMIN C PO) Take 1 tablet by mouth daily.    [provider]  Calcium Carbonate Antacid (TUMS PO) Take 2-3 tablets by mouth 3 (three) times daily as needed (acid reflux/indigestion).    [provider]  carvedilol (COREG) 12.5 MG tablet TAKE 1 TABLET (12.5 MG TOTAL) BY MOUTH 2 (TWO) TIMES DAILY WITH A MEAL. 04/17/20 04/17/21  Lyndee Hensen, DO  cholecalciferol (VITAMIN D3) 25 MCG (1000 UNIT) tablet Take 1,000 Units by mouth daily.    [provider]  colchicine 0.6 MG tablet TAKE 1 TABLET BY MOUTH EVERY OTHER DAY ON MONDAY, [place], AND FRIDAY. 05/07/20 05/07/21  Ladell Pier, MD  Cyanocobalamin 3000 MCG CAPS Take  3,000 mcg by mouth daily. Vitamin B12    [provider]  diphenhydrAMINE-APAP, sleep, (NON-ASPIRIN PM PO) Take 1 tablet by mouth at bedtime.    [provider]  ELDERBERRY PO Take 1 tablet by mouth daily.    [provider]  Levocetirizine Dihydrochloride (XYZAL PO) Take 1 tablet by mouth daily as needed (allergies).    [provider]  Multiple Vitamin (MULTIVITAMIN WITH MINERALS) TABS tablet Take 1 tablet by mouth daily.    [provider]  Multiple Vitamins-Minerals (ZINC PO) Take 1 tablet by mouth daily.    [provider]  omeprazole (PRILOSEC OTC) 20 MG tablet Take 20 mg by mouth daily as needed (acid  reflux/indigestion).    [provider]  Tetrahydrozoline HCl (VISINE OP) Place 1 drop into both eyes daily as needed (dry eyes).     [provider]  vitamin E 1000 UNIT capsule Take 1,000 Units by mouth daily.    [provider]    Family History Family History  Problem Relation Age of Onset   Liver disease Father    Alcohol abuse Father    Diabetes Sister    Obesity Sister    Cancer Mother        breast    Kidney disease Mother    Colon cancer Neg Hx    Esophageal cancer Neg Hx    Rectal cancer Neg Hx    Stomach cancer Neg Hx    Colon polyps Neg Hx     Social History Social History   Tobacco Use   Smoking status: Former    Packs/day: 0.25    Years: 6.00    Pack years: 1.50    Types: Cigarettes    Quit date: 1995    Years since quitting: 28.0   Smokeless tobacco: Never  Vaping Use   Vaping Use: Never used  Substance Use Topics   Alcohol use: Yes    Alcohol/week: 6.0 standard drinks    Types: 6 Cans of beer per week    Comment: 6 drinks Beer /Liquor   Drug use: No     Allergies   Patient has no known allergies.   Review of Systems Review of Systems  Constitutional:  Negative for chills and fever.  HENT:  Positive for congestion and sore throat.   Eyes:  Negative for discharge and redness.  Respiratory:  Positive for cough and shortness of breath.   Skin:  Positive for color change and wound.  Neurological:  Positive for dizziness and headaches. Negative for numbness.    Physical Exam Triage Vital Signs ED Triage Vitals [07/04/21 1535]  Enc Vitals Group     BP (!) 163/104     Pulse Rate (!) 106     Resp 16     Temp 98.4 F (36.9 C)     Temp Source Oral     SpO2 98 %     Weight      Height      Head Circumference      Peak Flow      Pain Score 9     Pain Loc      Pain Edu?      Excl. in [place]?    No data found.  Updated Vital Signs BP (!) 163/104 (BP Location: Left Arm)    Pulse (!) 106    Temp 98.4 F (36.9 C)  (Oral)    Resp 16    SpO2 98%  Physical Exam Vitals and nursing note reviewed.  Constitutional:      General: He is not in acute distress.    Appearance: Normal appearance. He is not ill-appearing.  HENT:     Head: Normocephalic and atraumatic.  Eyes:     Conjunctiva/sclera: Conjunctivae normal.  Cardiovascular:     Rate and Rhythm: Regular rhythm. Tachycardia present.  Pulmonary:     Effort: Pulmonary effort is normal. No respiratory distress.     Breath sounds: Normal breath sounds. No wheezing, rhonchi or rales.  Neurological:     Mental Status: He is alert.  Psychiatric:        Mood and Affect: Mood normal.        Behavior: Behavior normal.        Thought Content: Thought content normal.     UC Treatments / Results  Labs (all labs ordered are listed, but only abnormal results are displayed) Labs Reviewed - No data to display  EKG   Radiology No results found.  Procedures Procedures (including critical care time)  Medications Ordered in UC Medications - No data to display  Initial Impression / Assessment and Plan / UC Course  I have reviewed the triage vital signs and the nursing notes.  Pertinent labs & imaging results that were available during my care of the patient were reviewed by me and considered in my medical decision making (see chart for details).    Given elevated blood pressure, tachycardia, shortness of breath, dizziness recommended further evaluation in the ED for stat labs, etc. EKG with minimal changes compared to previous. Patient is stable for POV transport and wife who accompanies him today will drive him.   Final Clinical Impressions(s) / UC Diagnoses   Final diagnoses:  Shortness of breath   Discharge Instructions   None    ED Prescriptions   None    PDMP not reviewed this encounter.   Francene Finders, PA-C 07/04/21 [phone]

## 2021-07-05 ENCOUNTER — Emergency Department (HOSPITAL_COMMUNITY): Payer: 59

## 2021-07-05 ENCOUNTER — Other Ambulatory Visit: Payer: Self-pay

## 2021-07-05 ENCOUNTER — Observation Stay (HOSPITAL_COMMUNITY): Payer: 59

## 2021-07-05 ENCOUNTER — Ambulatory Visit: Payer: Self-pay | Admitting: *Deleted

## 2021-07-05 ENCOUNTER — Encounter (HOSPITAL_COMMUNITY): Payer: Self-pay

## 2021-07-05 ENCOUNTER — Observation Stay (HOSPITAL_COMMUNITY)
Admission: EM | Admit: 2021-07-05 | Discharge: 2021-07-07 | Disposition: A | Discharge status: Home / Self Care | Payer: 59 | Attending: Internal Medicine | Admitting: Internal Medicine

## 2021-07-05 DIAGNOSIS — K921 Melena: Secondary | ICD-10-CM

## 2021-07-05 DIAGNOSIS — N189 Chronic kidney disease, unspecified: Secondary | ICD-10-CM | POA: Diagnosis not present

## 2021-07-05 DIAGNOSIS — E871 Hypo-osmolality and hyponatremia: Secondary | ICD-10-CM | POA: Diagnosis present

## 2021-07-05 DIAGNOSIS — Z87891 Personal history of nicotine dependence: Secondary | ICD-10-CM | POA: Diagnosis not present

## 2021-07-05 DIAGNOSIS — Z79899 Other long term (current) drug therapy: Secondary | ICD-10-CM | POA: Diagnosis not present

## 2021-07-05 DIAGNOSIS — I10 Essential (primary) hypertension: Secondary | ICD-10-CM

## 2021-07-05 DIAGNOSIS — R195 Other fecal abnormalities: Secondary | ICD-10-CM | POA: Insufficient documentation

## 2021-07-05 DIAGNOSIS — R7303 Prediabetes: Secondary | ICD-10-CM | POA: Diagnosis present

## 2021-07-05 DIAGNOSIS — K269 Duodenal ulcer, unspecified as acute or chronic, without hemorrhage or perforation: Secondary | ICD-10-CM | POA: Diagnosis not present

## 2021-07-05 DIAGNOSIS — D696 Thrombocytopenia, unspecified: Secondary | ICD-10-CM | POA: Diagnosis present

## 2021-07-05 DIAGNOSIS — R1010 Upper abdominal pain, unspecified: Secondary | ICD-10-CM | POA: Diagnosis not present

## 2021-07-05 DIAGNOSIS — D638 Anemia in other chronic diseases classified elsewhere: Secondary | ICD-10-CM | POA: Diagnosis present

## 2021-07-05 DIAGNOSIS — D500 Iron deficiency anemia secondary to blood loss (chronic): Secondary | ICD-10-CM

## 2021-07-05 DIAGNOSIS — Z20822 Contact with and (suspected) exposure to covid-19: Secondary | ICD-10-CM | POA: Insufficient documentation

## 2021-07-05 DIAGNOSIS — F1090 Alcohol use, unspecified, uncomplicated: Secondary | ICD-10-CM | POA: Diagnosis not present

## 2021-07-05 DIAGNOSIS — I129 Hypertensive chronic kidney disease with stage 1 through stage 4 chronic kidney disease, or unspecified chronic kidney disease: Secondary | ICD-10-CM | POA: Diagnosis not present

## 2021-07-05 DIAGNOSIS — R0602 Shortness of breath: Secondary | ICD-10-CM | POA: Diagnosis not present

## 2021-07-05 DIAGNOSIS — F1020 Alcohol dependence, uncomplicated: Secondary | ICD-10-CM

## 2021-07-05 DIAGNOSIS — D649 Anemia, unspecified: Secondary | ICD-10-CM | POA: Diagnosis not present

## 2021-07-05 DIAGNOSIS — K760 Fatty (change of) liver, not elsewhere classified: Secondary | ICD-10-CM | POA: Diagnosis not present

## 2021-07-05 DIAGNOSIS — D509 Iron deficiency anemia, unspecified: Secondary | ICD-10-CM | POA: Diagnosis not present

## 2021-07-05 DIAGNOSIS — E785 Hyperlipidemia, unspecified: Secondary | ICD-10-CM | POA: Diagnosis not present

## 2021-07-05 DIAGNOSIS — Z0389 Encounter for observation for other suspected diseases and conditions ruled out: Secondary | ICD-10-CM | POA: Diagnosis not present

## 2021-07-05 DIAGNOSIS — R739 Hyperglycemia, unspecified: Secondary | ICD-10-CM | POA: Diagnosis not present

## 2021-07-05 DIAGNOSIS — J069 Acute upper respiratory infection, unspecified: Secondary | ICD-10-CM

## 2021-07-05 LAB — PREPARE RBC (CROSSMATCH)

## 2021-07-05 LAB — CBC
HCT: 18 % — ABNORMAL LOW (ref 39.0–52.0)
Hemoglobin: 5.5 g/dL — CL (ref 13.0–17.0)
MCH: 24.3 pg — ABNORMAL LOW (ref 26.0–34.0)
MCHC: 30.6 g/dL (ref 30.0–36.0)
MCV: 79.6 fL — ABNORMAL LOW (ref 80.0–100.0)
Platelets: 68 10*3/uL — ABNORMAL LOW (ref 150–400)
RBC: 2.26 MIL/uL — ABNORMAL LOW (ref 4.22–5.81)
RDW: 23.9 % — ABNORMAL HIGH (ref 11.5–15.5)
WBC: 12.3 10*3/uL — ABNORMAL HIGH (ref 4.0–10.5)
nRBC: 1 % — ABNORMAL HIGH (ref 0.0–0.2)

## 2021-07-05 LAB — HEPATIC FUNCTION PANEL
ALT: 31 U/L (ref 0–44)
AST: 71 U/L — ABNORMAL HIGH (ref 15–41)
Albumin: 3.8 g/dL (ref 3.5–5.0)
Alkaline Phosphatase: 29 U/L — ABNORMAL LOW (ref 38–126)
Bilirubin, Direct: <0.1 mg/dL (ref 0.0–0.2)
Total Bilirubin: 0.5 mg/dL (ref 0.3–1.2)
Total Protein: 8.6 g/dL — ABNORMAL HIGH (ref 6.5–8.1)

## 2021-07-05 LAB — IRON AND TIBC
Iron: 15 ug/dL — ABNORMAL LOW (ref 45–182)
Saturation Ratios: 3 % — ABNORMAL LOW (ref 17.9–39.5)
TIBC: 515 ug/dL — ABNORMAL HIGH (ref 250–450)
UIBC: 500 ug/dL

## 2021-07-05 LAB — RESPIRATORY PANEL BY PCR

## 2021-07-05 LAB — BASIC METABOLIC PANEL
Anion gap: 10 (ref 5–15)
BUN: 14 mg/dL (ref 6–20)
CO2: 21 mmol/L — ABNORMAL LOW (ref 22–32)
Calcium: 9.2 mg/dL (ref 8.9–10.3)
Chloride: 100 mmol/L (ref 98–111)
Creatinine, Ser: 1.25 mg/dL — ABNORMAL HIGH (ref 0.61–1.24)
GFR, Estimated: >60 mL/min (ref >60)
Glucose, Bld: 134 mg/dL — ABNORMAL HIGH (ref 70–99)
Potassium: 3.5 mmol/L (ref 3.5–5.1)
Sodium: 131 mmol/L — ABNORMAL LOW (ref 135–145)

## 2021-07-05 LAB — RESP PANEL BY RT-PCR (FLU A&B, COVID) ARPGX2
Influenza A by PCR: NEGATIVE
Influenza B by PCR: NEGATIVE
SARS Coronavirus 2 by RT PCR: NEGATIVE

## 2021-07-05 LAB — URINALYSIS, ROUTINE W REFLEX MICROSCOPIC
Bilirubin Urine: NEGATIVE
Glucose, UA: NEGATIVE mg/dL
Hgb urine dipstick: NEGATIVE
Ketones, ur: NEGATIVE mg/dL
Leukocytes,Ua: NEGATIVE
Nitrite: NEGATIVE
Protein, ur: NEGATIVE mg/dL
Specific Gravity, Urine: <1.005 — ABNORMAL LOW (ref 1.005–1.030)
pH: 5.5 (ref 5.0–8.0)

## 2021-07-05 LAB — RETICULOCYTES
Immature Retic Fract: 55.7 % — ABNORMAL HIGH (ref 2.3–15.9)
RBC.: 2.37 MIL/uL — ABNORMAL LOW (ref 4.22–5.81)
Retic Count, Absolute: 88.6 10*3/uL (ref 19.0–186.0)
Retic Ct Pct: 3.7 % — ABNORMAL HIGH (ref 0.4–3.1)

## 2021-07-05 LAB — FERRITIN: Ferritin: 5 ng/mL — ABNORMAL LOW (ref 24–336)

## 2021-07-05 LAB — TROPONIN I (HIGH SENSITIVITY): Troponin I (High Sensitivity): 6 ng/L (ref <18)

## 2021-07-05 LAB — LIPASE, BLOOD: Lipase: 26 U/L (ref 11–51)

## 2021-07-05 LAB — BRAIN NATRIURETIC PEPTIDE: B Natriuretic Peptide: 52.4 pg/mL (ref 0.0–100.0)

## 2021-07-05 LAB — ABO/RH: ABO/RH(D): A POS

## 2021-07-05 LAB — FOLATE: Folate: 11.4 ng/mL (ref >5.9)

## 2021-07-05 LAB — PROTIME-INR
INR: 1.1 (ref 0.8–1.2)
Prothrombin Time: 14.5 seconds (ref 11.4–15.2)

## 2021-07-05 LAB — POC OCCULT BLOOD, ED: Fecal Occult Bld: POSITIVE — AB

## 2021-07-05 LAB — VITAMIN B12: Vitamin B-12: 1222 pg/mL — ABNORMAL HIGH (ref 180–914)

## 2021-07-05 MED ORDER — HYDRALAZINE HCL 20 MG/ML IJ SOLN: 10.0000 mg | Freq: Four times a day (QID) | INTRAMUSCULAR | Status: DC | PRN

## 2021-07-05 MED ORDER — SODIUM CHLORIDE 0.9% IV SOLUTION: Freq: Once | INTRAVENOUS | Status: AC

## 2021-07-05 MED ORDER — SODIUM CHLORIDE 0.9 % IV SOLN
50.0000 ug/h | INTRAVENOUS | Status: DC
  Administered 2021-07-05 – 2021-07-06 (×2): 50 ug/h via INTRAVENOUS
  Filled 2021-07-05 (×4): qty 1

## 2021-07-05 MED ORDER — FOLIC ACID 1 MG PO TABS
1.0000 mg | ORAL_TABLET | Freq: Every day | ORAL | Status: DC
  Administered 2021-07-05 – 2021-07-07 (×3): 1 mg via ORAL
  Filled 2021-07-05 (×3): qty 1

## 2021-07-05 MED ORDER — THIAMINE HCL 100 MG/ML IJ SOLN
100.0000 mg | Freq: Every day | INTRAMUSCULAR | Status: DC
  Filled 2021-07-05: qty 2

## 2021-07-05 MED ORDER — LORAZEPAM 2 MG/ML IJ SOLN
1.0000 mg | INTRAMUSCULAR | Status: DC | PRN
  Administered 2021-07-06: 2 mg via INTRAVENOUS
  Filled 2021-07-05: qty 1

## 2021-07-05 MED ORDER — OCTREOTIDE LOAD VIA INFUSION
50.0000 ug | Freq: Once | INTRAVENOUS | Status: AC
Start: 2021-07-05 — End: 2021-07-05
  Administered 2021-07-05: 50 ug via INTRAVENOUS
  Filled 2021-07-05: qty 25

## 2021-07-05 MED ORDER — BISACODYL 10 MG RE SUPP: 10.0000 mg | Freq: Every day | RECTAL | Status: DC | PRN

## 2021-07-05 MED ORDER — SODIUM CHLORIDE 0.9 % IV SOLN: 10.0000 mL/h | Freq: Once | INTRAVENOUS | Status: DC

## 2021-07-05 MED ORDER — ONDANSETRON HCL 4 MG/2ML IJ SOLN: 4.0000 mg | Freq: Four times a day (QID) | INTRAMUSCULAR | Status: DC | PRN

## 2021-07-05 MED ORDER — ONDANSETRON HCL 4 MG PO TABS: 4.0000 mg | ORAL_TABLET | Freq: Four times a day (QID) | ORAL | Status: DC | PRN

## 2021-07-05 MED ORDER — PANTOPRAZOLE 80MG IVPB - SIMPLE MED
80.0000 mg | Freq: Once | INTRAVENOUS | Status: AC
  Administered 2021-07-05: 80 mg via INTRAVENOUS
  Filled 2021-07-05: qty 80

## 2021-07-05 MED ORDER — ADULT MULTIVITAMIN W/MINERALS CH
1.0000 | ORAL_TABLET | Freq: Every day | ORAL | Status: DC
  Administered 2021-07-05 – 2021-07-07 (×3): 1 via ORAL
  Filled 2021-07-05 (×3): qty 1

## 2021-07-05 MED ORDER — POLYETHYLENE GLYCOL 3350 17 G PO PACK: 17.0000 g | PACK | Freq: Every day | ORAL | Status: DC | PRN

## 2021-07-05 MED ORDER — AMLODIPINE BESYLATE 10 MG PO TABS
ORAL_TABLET | Freq: Every day | ORAL | 6 refills | Status: DC
  Filled 2021-07-05: qty 30, fill #0

## 2021-07-05 MED ORDER — ACETAMINOPHEN 325 MG PO TABS: 650.0000 mg | ORAL_TABLET | Freq: Four times a day (QID) | ORAL | Status: DC | PRN

## 2021-07-05 MED ORDER — ACETAMINOPHEN 650 MG RE SUPP: 650.0000 mg | Freq: Four times a day (QID) | RECTAL | Status: DC | PRN

## 2021-07-05 MED ORDER — OXYCODONE HCL 5 MG PO TABS: 5.0000 mg | ORAL_TABLET | ORAL | Status: DC | PRN

## 2021-07-05 MED ORDER — ALBUTEROL SULFATE HFA 108 (90 BASE) MCG/ACT IN AERS
2.0000 | INHALATION_SPRAY | RESPIRATORY_TRACT | Status: DC | PRN
  Filled 2021-07-05: qty 6.7

## 2021-07-05 MED ORDER — SODIUM CHLORIDE 0.9 % IV SOLN: INTRAVENOUS | Status: DC

## 2021-07-05 MED ORDER — THIAMINE HCL 100 MG PO TABS
100.0000 mg | ORAL_TABLET | Freq: Every day | ORAL | Status: DC
  Administered 2021-07-05 – 2021-07-07 (×3): 100 mg via ORAL
  Filled 2021-07-05 (×3): qty 1

## 2021-07-05 MED ORDER — LORAZEPAM 1 MG PO TABS: 1.0000 mg | ORAL_TABLET | ORAL | Status: DC | PRN

## 2021-07-05 NOTE — ED Provider Notes (Signed)
Emergency Medicine Provider Triage Evaluation Note  Stephen Bowers , a 61 y.o. male  was evaluated in triage.  Pt complains of near syncope, lightheadedness, dark stools, cough and weakness for few weeks.  Patient was COVID tested yesterday at urgent care and negative.  He feels short of breath..  Review of Systems  Positive: Symptoms as above Negative: Fever  Physical Exam  BP 119/73 (BP Location: Right Arm)    Pulse 85    Temp 97.9 F (36.6 C)    Resp 17    Ht 5\' 9"  (1.753 m)    Wt 81.6 kg    SpO2 100%    BMI 26.58 kg/m  Gen:   Awake, no distress   Resp:  Normal effort  MSK:   Moves extremities without difficulty  Other:  No pallor  Medical Decision Making  Medically screening exam initiated at 2:03 PM.  Appropriate orders placed.  Stephen Bowers was informed that the remainder of the evaluation will be completed by another provider, this initial triage assessment does not replace that evaluation, and the importance of remaining in the ED until their evaluation is complete.  Work-up initiated   Margarita Mail, PA-C 17/61/60 [id number]    Lianne Cure, DO 12/26/92 1639

## 2021-07-05 NOTE — ED Triage Notes (Signed)
Pt arrived POV from home c/o [facility], dizziness and a cough that has been going on since 12/26. Pt was seen at urgent care yesterday and had 2 negative COVID test.

## 2021-07-05 NOTE — ED Notes (Signed)
Pt second unit of blood completed - cont fluids to be hung now

## 2021-07-05 NOTE — ED Provider Notes (Signed)
Emergency Department Provider Note   I have reviewed the triage vital signs and the nursing notes.   HISTORY  Chief Complaint Shortness of Breath and Dizziness   HPI Stephen Bowers is a 61 y.o. male with past medical history reviewed below presents to the emergency department with generalized weakness, lightheadedness, cough/congestion, shortness of breath.  Symptoms have been worsening over the past week to 10 days.  He is tested negative for COVID twice including once at [facility] yesterday. Denies fever. No chills. Denies abdominal pain. No hematemesis.  He notes 2 episodes last week of some dark, formed bowel movements but denies any loose, black stools. No history of GI bleeding or anemia requiring blood transfusion.  He does have a history of alcohol use but states he has moderated his drinking significantly.  He did drink some last week during a trip but estimates that he drank around 6 beers the entire trip and nothing since returning home. He is not anticoagulated.    Past Medical History:  Diagnosis Date   Chronic musculoskeletal pain    CKD (chronic kidney disease)    follows with [facility] Hollie Salk)   High cholesterol Dx 2011   Hypertension    Pancolitis ([place])    Wears partial dentures    upper only    Review of Systems  Constitutional: No fever/chills. Positive weakness.  Eyes: No visual changes. ENT: No sore throat. Cardiovascular: Denies chest pain. Positive lightheadedness.  Respiratory: Positive shortness of breath. Gastrointestinal: No abdominal pain.  No nausea, no vomiting.  No diarrhea.  No constipation. Genitourinary: Negative for dysuria. Musculoskeletal: Negative for back pain. Skin: Negative for rash. Neurological: Negative for headaches, focal weakness or numbness.  10-point ROS otherwise negative.  ____________________________________________   PHYSICAL EXAM:  VITAL SIGNS: ED Triage Vitals  Enc Vitals Group     BP 07/05/21 1400 119/73      Pulse Rate 07/05/21 1400 85     Resp 07/05/21 1400 17     Temp 07/05/21 1400 97.9 F (36.6 C)     Temp src --      SpO2 07/05/21 1400 100 %     Weight 07/05/21 1359 180 lb (81.6 kg)     Height 07/05/21 1359 5\' 9"  (1.753 m)   Constitutional: Alert and oriented. Well appearing and in no acute distress. Eyes: Conjunctivae are normal.  Head: Atraumatic. Nose: No congestion/rhinnorhea. Mouth/Throat: Mucous membranes are moist.  Neck: No stridor.   Cardiovascular: Normal rate, regular rhythm. Good peripheral circulation. Grossly normal heart sounds.   Respiratory: Normal respiratory effort.  No retractions. Lungs CTAB. Gastrointestinal: Soft and nontender. No distention.  Rectal exam performed after patient's verbal consent and with nurse chaperone.  No external hemorrhoids or fissures.  No bright red blood on digital rectal exam.  No melena. Musculoskeletal: No lower extremity tenderness nor edema. No gross deformities of extremities. Neurologic:  Normal speech and language. No gross focal neurologic deficits are appreciated.  Skin:  Skin is warm, dry and intact. No rash noted.  ____________________________________________   LABS (all labs ordered are listed, but only abnormal results are displayed)  Labs Reviewed  BASIC METABOLIC PANEL - Abnormal; Notable for the following components:      Result Value   Sodium 131 (*)    CO2 21 (*)    Glucose, Bld 134 (*)    Creatinine, Ser 1.25 (*)    All other components within normal limits  CBC - Abnormal; Notable for the following components:  WBC 12.3 (*)    RBC 2.26 (*)    Hemoglobin 5.5 (*)    HCT 18.0 (*)    MCV 79.6 (*)    MCH 24.3 (*)    RDW 23.9 (*)    Platelets 68 (*)    nRBC 1.0 (*)    All other components within normal limits  URINALYSIS, ROUTINE W REFLEX MICROSCOPIC - Abnormal; Notable for the following components:   Specific Gravity, Urine <1.005 (*)    All other components within normal limits  VITAMIN B12 -  Abnormal; Notable for the following components:   Vitamin B-12 1,222 (*)    All other components within normal limits  IRON AND TIBC - Abnormal; Notable for the following components:   Iron 15 (*)    TIBC 515 (*)    Saturation Ratios 3 (*)    All other components within normal limits  FERRITIN - Abnormal; Notable for the following components:   Ferritin 5 (*)    All other components within normal limits  RETICULOCYTES - Abnormal; Notable for the following components:   Retic Ct Pct 3.7 (*)    RBC. 2.37 (*)    Immature Retic Fract 55.7 (*)    All other components within normal limits  HEPATIC FUNCTION PANEL - Abnormal; Notable for the following components:   Total Protein 8.6 (*)    AST 71 (*)    Alkaline Phosphatase 29 (*)    All other components within normal limits  HEMOGLOBIN A1C - Abnormal; Notable for the following components:   Hgb A1c MFr Bld 5.9 (*)    All other components within normal limits  COMPREHENSIVE METABOLIC PANEL - Abnormal; Notable for the following components:   Sodium 134 (*)    Glucose, Bld 153 (*)    Calcium 8.8 (*)    AST 90 (*)    Alkaline Phosphatase 31 (*)    Total Bilirubin 1.4 (*)    All other components within normal limits  CBC - Abnormal; Notable for the following components:   RBC 2.91 (*)    Hemoglobin 7.6 (*)    HCT 23.4 (*)    RDW 21.5 (*)    Platelets 71 (*)    nRBC 1.4 (*)    All other components within normal limits  CBC - Abnormal; Notable for the following components:   RBC 3.04 (*)    Hemoglobin 7.7 (*)    HCT 24.6 (*)    MCH 25.3 (*)    RDW 21.4 (*)    Platelets 83 (*)    nRBC 1.3 (*)    All other components within normal limits  CBC - Abnormal; Notable for the following components:   RBC 3.05 (*)    Hemoglobin 7.8 (*)    HCT 24.7 (*)    MCH 25.6 (*)    RDW 21.9 (*)    Platelets 104 (*)    nRBC 1.2 (*)    All other components within normal limits  COMPREHENSIVE METABOLIC PANEL - Abnormal; Notable for the following  components:   Sodium 134 (*)    Potassium 3.4 (*)    Glucose, Bld 124 (*)    Calcium 8.6 (*)    Albumin 3.4 (*)    AST 96 (*)    Alkaline Phosphatase 28 (*)    All other components within normal limits  CBC - Abnormal; Notable for the following components:   WBC 10.8 (*)    RBC 3.07 (*)    Hemoglobin 7.8 (*)  HCT 25.0 (*)    MCH 25.4 (*)    RDW 22.6 (*)    Platelets 120 (*)    nRBC 0.8 (*)    All other components within normal limits  GLUCOSE, CAPILLARY - Abnormal; Notable for the following components:   Glucose-Capillary 117 (*)    All other components within normal limits  CBG MONITORING, ED - Abnormal; Notable for the following components:   Glucose-Capillary 176 (*)    All other components within normal limits  POC OCCULT BLOOD, ED - Abnormal; Notable for the following components:   Fecal Occult Bld POSITIVE (*)    All other components within normal limits  RESP PANEL BY RT-PCR (FLU A&B, COVID) ARPGX2  RESPIRATORY PANEL BY PCR  BRAIN NATRIURETIC PEPTIDE  FOLATE  PROTIME-INR  LIPASE, BLOOD  RAPID URINE DRUG SCREEN, HOSP PERFORMED  MAGNESIUM  PHOSPHORUS  HIV ANTIBODY (ROUTINE TESTING W REFLEX)  TSH  HEPATITIS PANEL, ACUTE  MAGNESIUM  TYPE AND SCREEN  PREPARE RBC (CROSSMATCH)  ABO/RH  PREPARE RBC (CROSSMATCH)  SURGICAL PATHOLOGY  TROPONIN I (HIGH SENSITIVITY)  TROPONIN I (HIGH SENSITIVITY)   ____________________________________________  EKG   EKG Interpretation  Date/Time:  Tuesday July 05 2021 14:04:05 EST Ventricular Rate:  78 PR Interval:  164 QRS Duration: 86 QT Interval:  410 QTC Calculation: 467 R Axis:   57 Text Interpretation: Normal sinus rhythm Normal ECG When compared with ECG of 04-Jul-2021 15:51, PREVIOUS ECG IS PRESENT Similar to Oct 2021 and yesterday's tracing Confirmed by Nanda Quinton [phone]) on 07/05/2021 3:32:34 PM        ____________________________________________  RADIOLOGY  DG Chest 2 View  Result Date:  07/05/2021 CLINICAL DATA:  Shortness of breath EXAM: CHEST - 2 VIEW COMPARISON:  Chest x-ray 04/15/2020 FINDINGS: Heart size and mediastinal contours are within normal limits. No suspicious pulmonary opacities identified. No pleural effusion or pneumothorax visualized. Healing/old rib fractures on the right. No acute osseous abnormality appreciated. IMPRESSION: No acute intrathoracic process identified. Electronically Signed   By: Ofilia Neas M.D.   On: 07/05/2021 14:48    ____________________________________________   PROCEDURES  Procedure(s) performed:   Procedures  CRITICAL CARE Performed by: Margette Fast Total critical care time: 35 minutes Critical care time was exclusive of separately billable procedures and treating other patients. Critical care was necessary to treat or prevent imminent or life-threatening deterioration. Critical care was time spent personally by me on the following activities: development of treatment plan with patient and/or surrogate as well as nursing, discussions with consultants, evaluation of patient's response to treatment, examination of patient, obtaining history from patient or surrogate, ordering and performing treatments and interventions, ordering and review of laboratory studies, ordering and review of radiographic studies, pulse oximetry and re-evaluation of patient's condition.  Nanda Quinton, MD Emergency Medicine  ____________________________________________   INITIAL IMPRESSION / ASSESSMENT AND PLAN / ED COURSE  Pertinent labs & imaging results that were available during my care of the patient were reviewed by me and considered in my medical decision making (see chart for details).   This patient is Presenting for Evaluation of weakness/near syncope, which does require a range of treatment options, and is a complaint that involves a high risk of morbidity and mortality.  The Differential Diagnoses include arrhythmia, cardiac ischemic  process, anemia, community-acquired pneumonia, viral process.   Medical Decision Making: Summary:   Patient presents emergency department with generalized weakness.  He has some associated viral URI type symptoms but is apparently tested negative for COVID  twice in the past couple of days.  Lab work from [employer] process here shows significant anemia with hemoglobin of 5.5.  Hemodynamically, the patient is very stable and well-appearing here.  I not appreciate any gross blood or melena on rectal exam.  He does drink alcohol but appears to have moderated his drinking significantly since prior ED presentations.  Suspicion for variceal bleed is very low with no hematemesis.  He is not anticoagulated.  Discussed the risk/benefit of PRBC transfusion and he consents to blood transfusion.  I have ordered 2 units along with Protonix. Have sent anemia panel and type and screen.     04:45 PM Reevaluation with update and discussion with patient. After initial assessment and treatment, an updated evaluation reveals normal vitals and no worsening symptoms. Illness risk,care place, discussed. [facility]      Critical Interventions- Labs; to evaluate  Chief Complaint  Patient presents with   Shortness of Breath   Dizziness    and assess for illness characterized as Acute, Uncertain Prognosis, Complicated, and Threat to Life/Bodily Function   After These Interventions, the Patient was reevaluated and was found Patient has significant anemia of unclear etiology.  7:18 AM-Consult complete with Dr. Rush Landmark. Patient case explained and discussed. Requirement for consult in the AM agreed on. Recommends upper abdominal US, which I have ordered. If any liver abnormality or enlarged spleen would recommend adding octreotide. BID PPI for now after initial bolus given in the ED. Clear liquids for now and NPO after midnight. Plan for consult in the AM for consideration of upper endoscopy.    Discussed patient's case  with [facility] to request admission. Patient and family (if present) updated with plan. Care transferred to [facility] service.  I reviewed all nursing notes, vitals, pertinent old records, EKGs, labs, imaging (as available).      I decided to review pertinent External Data, and in summary patient with no prior history in lab review of severe anemia.    Clinical Laboratory Tests Ordered, included CBC, Metabolic panel, and troponin  . Review indicates severe microcytic anemia. Troponin is WNL. Platelets of 68. Creatinine of 1.25 slightly above baseline.  Emergent testing abnormality management required for stabilization- PRBC transfusion.   Radiologic Tests Ordered, included CXR.  I independently Visualized: CXR Images, which show no acute abnormality.     ____________________________________________  FINAL CLINICAL IMPRESSION(S) / ED DIAGNOSES  Final diagnoses:  Symptomatic anemia     MEDICATIONS GIVEN DURING THIS VISIT:  Medications  pantoprazole (PROTONIX) 80 mg /NS 100 mL IVPB (0 mg Intravenous Stopped 07/05/21 1802)  octreotide (SANDOSTATIN) 2 mcg/mL load via infusion 50 mcg (50 mcg Intravenous Bolus from Bag 07/05/21 1900)  0.9 %  sodium chloride infusion (Manually program via Guardrails IV Fluids) (0 mLs Intravenous Stopped 07/05/21 2316)  ferric gluconate (FERRLECIT) 250 mg in sodium chloride 0.9 % 250 mL IVPB (250 mg Intravenous New Bag/Given 07/07/21 0836)  potassium chloride SA (KLOR-CON M) CR tablet 40 mEq (40 mEq Oral Given 07/07/21 0827)     NEW OUTPATIENT MEDICATIONS STARTED DURING THIS VISIT:  Discharge Medication List as of 07/07/2021 10:31 AM     START taking these medications   Details  ferrous sulfate 325 (65 FE) MG tablet Take 1 tablet (325 mg total) by mouth 2 (two) times daily with a meal., Starting Thu 07/07/2021, Until Fri 0/08/4095, Normal    folic acid (FOLVITE) 1 MG tablet Take 1 tablet (1 mg total) by mouth daily., Starting Fri 07/08/2021, Normal  pantoprazole  (PROTONIX) 40 MG tablet Take 1 tablet (40 mg total) by mouth daily at 6 (six) AM., Starting Fri 07/08/2021, Normal    thiamine 100 MG tablet Take 1 tablet (100 mg total) by mouth daily., Starting Fri 07/08/2021, Normal        Note:  This document was prepared using Dragon voice recognition software and may include unintentional dictation errors.  Nanda Quinton, MD, [facility] Emergency Medicine    Sadie Hazelett, [facility], MD 07/09/21 [phone]

## 2021-07-05 NOTE — ED Notes (Signed)
Hgb 5.5.  Stephen Bowers, [place] notified.

## 2021-07-05 NOTE — H&P (Signed)
History and Physical    Stephen Bowers VWU:[id number] DOB: 06-02-1961 DOA: 07/05/2021  PCP: Ladell Pier, MD   Patient coming from: Home  Chief Complaint: Upper respiratory symptoms, cough, congestion, shortness of breath and dizziness  HPI: Stephen Bowers is a 61 y.o. male with medical history significant for but not limited to chronic musculoskeletal pain, history of CKD, hyperlipidemia, hypertension, pancolitis as well as other comorbidities who presents with weakness associated with shortness of breath and dizziness as well as lightheadedness, cough and congestion.  Patient states the symptoms have been worsening over the last week to 10 days and he tested negative for COVID twice including once at urgent care yesterday.  He denies any fevers or chills but does state that he has had shortness of breath and dizziness as well as a cough has been going on since 06/27/2021 after he came back from the beach.  He states that he has had worsening congestion and cough and has been taking Mucinex and TheraFlu.  He states he is coughing up some green sputum.   Does not take any blood thinners and states that he had 2 episodes of dark formed bowel movements but no loose black stools or any hematemesis.  He said no history of GI bleeding or anemia requiring blood transfusions in the past and he does have a history of alcoholism and states that he has cut back to moderate drinking.  He did drink last week on a trip and estimates that he drank about 6 beers entire trip but nothing since he returned home.  Given his worsening upper respiratory complaints and dizziness and shortness of breath he came to the the ED for further evaluation and is found to have a hemoglobin of 5.5.  FOBT was positive on Hemoccult exam but no overt bleeding noted.  GI was consulted and he was placed on IV PPI.  TRH was asked to admit this patient for further evaluation recommendations  ED Course: In the ED he had basic blood  work done as well as an FOBT.  COVID testing is still pending.  He was given IV PPI bolus as well as albuterol inhaler.  GI was consulted and are planning on seeing the patient in the a.m.. He was typed and screened and will be transfused 2 units.  Abdominal ultrasound was done in the ED but was limited exam  Review of Systems: As per HPI otherwise all other systems reviewed and negative.   Past Medical History:  Diagnosis Date   Chronic musculoskeletal pain    CKD (chronic kidney disease)    follows with [facility])   High cholesterol Dx 2011   Hypertension    Pancolitis ([place])    Wears partial dentures    upper only   Past Surgical History:  Procedure Laterality Date   COLONOSCOPY  12/2014   hx polyps - Pyrtle   MULTIPLE TOOTH EXTRACTIONS     SOCIAL HISTORY  reports that he quit smoking about 28 years ago. His smoking use included cigarettes. He has a 1.50 pack-year smoking history. He has never used smokeless tobacco. He reports current alcohol use of about 6.0 standard drinks per week. He reports that he does not use drugs.  ALLERGIES No Known Allergies  Family History  Problem Relation Age of Onset   Liver disease Father    Alcohol abuse Father    Diabetes Sister    Obesity Sister    Cancer Mother  breast    Kidney disease Mother    Colon cancer Neg Hx    Esophageal cancer Neg Hx    Rectal cancer Neg Hx    Stomach cancer Neg Hx    Colon polyps Neg Hx    Prior to Admission medications   Medication Sig Start Date End Date Taking? Authorizing Provider  acetaminophen (TYLENOL) 650 MG CR tablet Take 1,300 mg by mouth every 8 (eight) hours as needed for pain.    [provider]  amLODipine (NORVASC) 10 MG tablet TAKE 1 TABLET (10 MG TOTAL) BY MOUTH DAILY. 07/05/21 07/05/22  Ladell Pier, MD  Ascorbic Acid (VITAMIN C PO) Take 1 tablet by mouth daily.    [provider]  Calcium Carbonate Antacid (TUMS PO) Take 2-3 tablets by mouth 3  (three) times daily as needed (acid reflux/indigestion).    [provider]  carvedilol (COREG) 12.5 MG tablet TAKE 1 TABLET (12.5 MG TOTAL) BY MOUTH 2 (TWO) TIMES DAILY WITH A MEAL. 04/17/20 04/17/21  Lyndee Hensen, DO  cholecalciferol (VITAMIN D3) 25 MCG (1000 UNIT) tablet Take 1,000 Units by mouth daily.    [provider]  colchicine 0.6 MG tablet TAKE 1 TABLET BY MOUTH EVERY OTHER DAY ON MONDAY, [place], AND FRIDAY. 05/07/20 05/07/21  Ladell Pier, MD  Cyanocobalamin 3000 MCG CAPS Take 3,000 mcg by mouth daily. Vitamin B12    [provider]  diphenhydrAMINE-APAP, sleep, (NON-ASPIRIN PM PO) Take 1 tablet by mouth at bedtime.    [provider]  ELDERBERRY PO Take 1 tablet by mouth daily.    [provider]  Levocetirizine Dihydrochloride (XYZAL PO) Take 1 tablet by mouth daily as needed (allergies).    [provider]  Multiple Vitamin (MULTIVITAMIN WITH MINERALS) TABS tablet Take 1 tablet by mouth daily.    [provider]  Multiple Vitamins-Minerals (ZINC PO) Take 1 tablet by mouth daily.    [provider]  omeprazole (PRILOSEC OTC) 20 MG tablet Take 20 mg by mouth daily as needed (acid reflux/indigestion).    [provider]  Tetrahydrozoline HCl (VISINE OP) Place 1 drop into both eyes daily as needed (dry eyes).     [provider]  vitamin E 1000 UNIT capsule Take 1,000 Units by mouth daily.    [provider]   Physical Exam: Vitals:   07/05/21 1715 07/05/21 1730 07/05/21 1800 07/05/21 1802  BP: 126/89 122/76 122/74 120/79  Pulse: 74 73 74 71  Resp: 18 13 19 18   Temp:    98.9 F (37.2 C)  TempSrc:    Oral  SpO2: 100% 99% 100% 100%  Weight:      Height:       Constitutional: WN/WD overweight African-American male currently no acute distress appears calm Eyes: Lids and conjunctivae normal, sclerae anicteric  ENMT: External Ears, Nose appear normal. Grossly normal  hearing. Neck: Appears normal, supple, no cervical masses, normal ROM, no appreciable thyromegaly; no appreciable JVD Respiratory: Slightly diminished to auscultation bilaterally, no wheezing, rales, rhonchi or crackles. Normal respiratory effort and patient is not tachypenic. No accessory muscle use.  Not wearing any supplemental oxygen via nasal cannula Cardiovascular: RRR, no murmurs / rubs / gallops. S1 and S2 auscultated.  Trace lower extremity edema Abdomen: Soft, non-tender, slightly distended secondary body habitus. Bowel sounds positive.  GU: Deferred. Musculoskeletal: No clubbing / cyanosis of digits/nails. No joint deformity upper and lower extremities.  Skin: No rashes, lesions, ulcers on limited skin evaluation. No  induration; Warm and dry.  Neurologic: CN 2-12 grossly intact with no focal deficits. Romberg sign and cerebellar reflexes not assessed.  Psychiatric: Normal judgment and insight. Alert and oriented x 3. Normal mood and appropriate affect.   Labs on Admission: I have personally reviewed following labs and imaging studies  CBC: Recent Labs  Lab 07/05/21 1427  WBC 12.3*  HGB 5.5*  HCT 18.0*  MCV 79.6*  PLT 68*   Basic Metabolic Panel: Recent Labs  Lab 07/05/21 1427  NA 131*  K 3.5  CL 100  CO2 21*  GLUCOSE 134*  BUN 14  CREATININE 1.25*  CALCIUM 9.2   GFR: Estimated Creatinine Clearance: 64.4 mL/min (A) (by C-G formula based on SCr of 1.25 mg/dL (H)). Liver Function Tests: No results for input(s): AST, ALT, ALKPHOS, BILITOT, PROT, ALBUMIN in the last 168 hours. No results for input(s): LIPASE, AMYLASE in the last 168 hours. No results for input(s): AMMONIA in the last 168 hours. Coagulation Profile: Recent Labs  Lab 07/05/21 1621  INR 1.1   Cardiac Enzymes: No results for input(s): CKTOTAL, CKMB, CKMBINDEX, TROPONINI in the last 168 hours. BNP (last 3 results) No results for input(s): PROBNP in the last 8760 hours. HbA1C: No results for  input(s): HGBA1C in the last 72 hours. CBG: No results for input(s): GLUCAP in the last 168 hours. Lipid Profile: No results for input(s): CHOL, HDL, LDLCALC, TRIG, CHOLHDL, LDLDIRECT in the last 72 hours. Thyroid Function Tests: No results for input(s): TSH, T4TOTAL, FREET4, T3FREE, THYROIDAB in the last 72 hours. Anemia Panel: Recent Labs    07/05/21 1532 07/05/21 1621  VITAMINB12  --  1,222*  FOLATE  --  11.4  FERRITIN  --  5*  TIBC  --  515*  IRON  --  15*  RETICCTPCT 3.7*  --    Urine analysis:    Component Value Date/Time   COLORURINE YELLOW 07/05/2021 1403   APPEARANCEUR CLEAR 07/05/2021 1403   LABSPEC <1.005 (L) 07/05/2021 1403   PHURINE 5.5 07/05/2021 1403   GLUCOSEU NEGATIVE 07/05/2021 1403   HGBUR NEGATIVE 07/05/2021 1403   BILIRUBINUR NEGATIVE 07/05/2021 1403   BILIRUBINUR small (A) 11/22/2018 0906   BILIRUBINUR neg 08/02/2015 1039   KETONESUR NEGATIVE 07/05/2021 1403   PROTEINUR NEGATIVE 07/05/2021 1403   UROBILINOGEN 1.0 11/22/2018 0906   UROBILINOGEN 0.2 06/19/2014 1556   NITRITE NEGATIVE 07/05/2021 1403   LEUKOCYTESUR NEGATIVE 07/05/2021 1403   Sepsis Labs: !!!!!!!!!!!!!!!!!!!!!!!!!!!!!!!!!!!!!!!!!!!! @LABRCNTIP (procalcitonin:4,lacticidven:4) )No results found for this or any previous visit (from the past 240 hour(s)).   Radiological Exams on Admission: DG Chest 2 View  Result Date: 07/05/2021 CLINICAL DATA:  Shortness of breath EXAM: CHEST - 2 VIEW COMPARISON:  Chest x-ray 04/15/2020 FINDINGS: Heart size and mediastinal contours are within normal limits. No suspicious pulmonary opacities identified. No pleural effusion or pneumothorax visualized. Healing/old rib fractures on the right. No acute osseous abnormality appreciated. IMPRESSION: No acute intrathoracic process identified. Electronically Signed   By: Ofilia Neas M.D.   On: 07/05/2021 14:48   US Abdomen Limited  Result Date: 07/05/2021 CLINICAL DATA:  Upper abdominal pain EXAM: ULTRASOUND  ABDOMEN LIMITED RIGHT UPPER QUADRANT COMPARISON:  04/12/2020 FINDINGS: Gallbladder: No gallstones or wall thickening visualized. No sonographic Murphy sign noted by sonographer. Common bile duct: Diameter: 3 mm Liver: Diffuse increased liver echotexture most compatible with hepatic steatosis, stable. No intrahepatic duct dilation. Portal vein is patent on color Doppler imaging with normal direction of blood flow towards the liver. Other: None. IMPRESSION: 1.  Stable hepatic steatosis. 2. Otherwise unremarkable exam. Electronically Signed   By: Randa Ngo M.D.   On: 07/05/2021 17:57    EKG: Independently reviewed.  Showed normal sinus rhythm at a rate of 78 and a QTC of 467.  No evidence of ST elevation on my interpretation  Assessment/Plan Principal Problem:   Symptomatic anemia Active Problems:   HTN (hypertension)   Hyperlipidemia   Prediabetes   Thrombocytopenia (HCC)   Hyponatremia   URI (upper respiratory infection)  Suspected acute URI -Presented with cough, sore throat, hoarseness and headache as well as nasal and chest congestion and green mucus -Shortness of breath is likely in the setting of symptomatic anemia but he did have some upper respiratory symptoms where he had cough and congestion and green sputum -Initially states that he took 2 COVID test both were negative but rapid test were inaccurate we will check a PCR COVID test here -Continue supportive care we will start the patient on normal saline at 75 MLS per hour -Initiated on albuterol inhaler 2 puffs IH every 2 as needed for wheezing and shortness of breath -We will check a respiratory virus panel and placed on droplet precautions; repeat COVID testing via PCR and influenza AMB is pending -Currently not requiring oxygen -Chest x-ray showed no acute intrathoracic process identified  Symptomatic anemia superimposed on chronic macrocytic anemia in setting of concern for slow GI bleed  -Patient presents with a  hemoglobin/hematocrit of 5.5/18.0 -He is FOBT positive but has no evidence of any melena or hematemesis hematemesis; last week he did have some formed dark stools; there is no overt bleeding as there is no hematemesis or gross blood or melena on rectal exam done by the EDP -We will type and screen and transfuse 2 units of PRBCs -Anemia panel done and showed an iron level of 15, U IBC 500, TIBC 550, saturation ratios of 3%, ferritin level 5, folate level of 11.4, and vitamin B12 1222 -Initiated on Protonix and will keep on IV PPI twice daily 40 mg for now -States he does not take any NSAIDs, Goody powder or BC powder -GIs been consulted and recommending clear liquid diet tonight and n.p.o. at midnight for possible endoscopy -His suspicion for variceal bleed is on the lower side given that he had no hematemesis -Currently not anticoagulated -Patient does have symptoms he complains of worsening shortness of breath and dizziness -Continue to monitor for signs and symptoms of bleeding and check orthostatic vital signs in the a.m.  History of alcohol abuse with ? varices -Has a history of significant drinking but he states that he is moderated drinking and drank about 6 beers last week -Case was discussed with GI who recommends abdominal ultrasound which has been ordered however a limited ultrasound was done instead of getting the full abdominal ultrasound. -We will obtain LFTs as well as lipase level and is still pending to be done -GI recommends following up on the liver ultrasound if there is any liver abnormality or spleen enlargement they are recommending adding octreotide in addition to the twice daily PPI I am keeping the patient on clear liquids for now and n.p.o. after midnight; I spoke with Dr. Rush Landmark who feels that we should just empirically place the patient on octreotide drip and follow-up on the spleen imaging -GI to see the patient in the a.m. for consideration of upper endoscopy -We  will place the patient on the CIWA protocol for any concern for withdrawal given his history of heavy alcohol  drinking and past  Thrombocytopenia -Likely a result of his alcoholic drinking and possible GI bleeding -Continue to monitor for signs and symptoms of bleeding; will be checking the spleen for splenomegaly -He does have a history of thrombocytopenia -Avoid pharmacological VTE prophylaxis and repeat CBC in a.m.  Hypertension -Hold antihypertensives in the setting of concern for GI bleeding; he takes amlodipine 10 mg p.o. daily as well as carvedilol 12.5 mg p.o. twice daily -Continue with IV fluid hydration -Continue monitor blood pressures per protocol and will add IV hydralazine as needed for systolic blood pressure greater than 793 or diastolic blood pressure in 100  Hyperglycemia in a patient with a history of prediabetes -Check hemoglobin A1c level in the a.m.: Last documented hemoglobin A1c in our system was 5.8 -If necessary will place on sensitive neurologic sign scale insulin AC  Hyponatremia -Started IV fluid hydration with normal saline at 75 MLS per -Sodium was 131 on admission -Continue monitor and trend and repeat CMP in a.m.  Metabolic acidosis -Mild with a CO2 of 21, anion gap of 10, chloride level 100 -Getting normal saline as above and will be given 2 units of PRBCs -Continue monitor and trend and repeat CMP in a.m.  Renal insufficiency/chronic kidney disease -Patient's BUN/creatinine was 40/1.25 -Expect to improve with IV fluid hydration as well as blood transfusion -Avoid further nephrotoxic medications, contrast dyes, hypotension renally dose medications -Repeat CMP in a.m.  GERD/GI prophylaxis -Holding his as needed omeprazole and starting the patient on IV PPI twice daily   DVT prophylaxis: SCDs Code Status: FULL CODE Family Communication:  Disposition Plan: Pending further clinical improvement and clearance by gastroenterology Consults called:  Gastroenterology Admission status: Observation telemetry  Severity of Illness: The appropriate patient status for this patient is OBSERVATION. Observation status is judged to be reasonable and necessary in order to provide the required intensity of service to ensure the patient's safety. The patient's presenting symptoms, physical exam findings, and initial radiographic and laboratory data in the context of their medical condition is felt to place them at decreased risk for further clinical deterioration. Furthermore, it is anticipated that the patient will be medically stable for discharge from the hospital within 2 midnights of admission.    Kerney Elbe, D.O. [facility] PAGER is on [place]  If 7PM-7AM, please contact night-coverage [email or web address]  07/05/2021, 6:31 PM

## 2021-07-05 NOTE — Addendum Note (Signed)
Addended by: Karle Plumber B on: 07/05/2021 04:40 PM   Modules accepted: Orders

## 2021-07-05 NOTE — ED Notes (Addendum)
Per blood bank they can only see order for one unit of blood - per MD note and my order pt is supposed to receive two units - MD notified and asked to reorder one unit

## 2021-07-05 NOTE — Telephone Encounter (Signed)
Note reviewed.  Pt in ER.  RF sent on Norvasc.

## 2021-07-05 NOTE — Progress Notes (Signed)
[facility] night cross cover note:  I was notified by RN that, while this patient was ordered to be transfused 2 units prbc, that the blood bank has reached out and conveyed that they are able to see only an order for transfusion of  a single unit PRBC.  Per my chart review, including that of the admission H&P as well as review of corresponding [facility] hospitalist communication, it appears that the patient was intended to receive a total of 2 units PRBC transfusion, with corresponding orders for such.  Consequently, I placed order to transfuse a second unit PRBC over 2 hours.     Babs Bertin, DO Hospitalist

## 2021-07-05 NOTE — Telephone Encounter (Signed)
Notes from UC visit yesterday: Given elevated blood pressure, tachycardia, shortness of breath, dizziness recommended further evaluation in the ED for stat labs, etc. EKG with minimal changes compared to previous. Patient is stable for POV transport and wife who accompanies him today will drive him.    Chief Complaint: dizziness, SOB Symptoms: dizziness, SOB, congestion Frequency: 12/23 Pertinent Negatives: Patient denies  Disposition: [x] ED /[] Urgent Care (no appt availability in office) / [] Appointment(In office/virtual)/ []  [facility]/ [] Home Care/ [] Refused Recommended Disposition /[] [facility]/ []  Follow-up with PCP Additional Notes: Patient was seen at [facility] yesterday and advised ED- he did not go. Patient advised to go to ED today as advised for symptoms. Patient is out of BP medications and has scheduled an appointment: 1/5. Will send message to office regarding this call. Reason for Disposition  [1] MODERATE dizziness (e.g., interferes with normal activities) AND [2] has NOT been evaluated by physician for this  (Exception: dizziness caused by heat exposure, sudden standing, or poor fluid intake)  [1] MILD difficulty breathing (e.g., minimal/no SOB at rest, SOB with walking, pulse <100) AND [2] NEW-onset or WORSE than normal  Answer Assessment - Initial Assessment Questions 1. DESCRIPTION: "Describe your dizziness."     BP today- 114/63, P 86- 4:30 am 2. LIGHTHEADED: "Do you feel lightheaded?" (e.g., somewhat faint, woozy, weak upon standing)     *No Answer* 3. VERTIGO: "Do you feel like either you or the room is spinning or tilting?" (i.e. vertigo)     *No Answer* 4. SEVERITY: "How bad is it?"  "Do you feel like you are going to faint?" "Can you stand and walk?"   - MILD: Feels slightly dizzy, but walking normally.   - MODERATE: Feels unsteady when walking, but not falling; interferes with normal activities (e.g., school, work).   - SEVERE: Unable to walk  without falling, or requires assistance to walk without falling; feels like passing out now.      *No Answer* 5. ONSET:  "When did the dizziness begin?"     *No Answer* 6. AGGRAVATING FACTORS: "Does anything make it worse?" (e.g., standing, change in head position)     *No Answer* 7. HEART RATE: "Can you tell me your heart rate?" "How many beats in 15 seconds?"  (Note: not all patients can do this)       *No Answer* 8. CAUSE: "What do you think is causing the dizziness?"     *No Answer* 9. RECURRENT SYMPTOM: "Have you had dizziness before?" If Yes, ask: "When was the last time?" "What happened that time?"     *No Answer* 10. OTHER SYMPTOMS: "Do you have any other symptoms?" (e.g., fever, chest pain, vomiting, diarrhea, bleeding)       *No Answer* 11. PREGNANCY: "Is there any chance you are pregnant?" "When was your last menstrual period?"       *No Answer*  Answer Assessment - Initial Assessment Questions 1. RESPIRATORY STATUS: "Describe your breathing?" (e.g., wheezing, shortness of breath, unable to speak, severe coughing)      With exertion 2. ONSET: "When did this breathing problem begin?"      12/23 3. PATTERN "Does the difficult breathing come and go, or has it been constant since it started?"      Comes and goes 4. SEVERITY: "How bad is your breathing?" (e.g., mild, moderate, severe)    - MILD: No SOB at rest, mild SOB with walking, speaks normally in sentences, can lie down, no retractions, pulse < 100.    -  MODERATE: SOB at rest, SOB with minimal exertion and prefers to sit, cannot lie down flat, speaks in phrases, mild retractions, audible wheezing, pulse 100-120.    - SEVERE: Very SOB at rest, speaks in single words, struggling to breathe, sitting hunched forward, retractions, pulse > 120      mild 5. RECURRENT SYMPTOM: "Have you had difficulty breathing before?" If Yes, ask: "When was the last time?" and "What happened that time?"      no 6. CARDIAC HISTORY: "Do you have  any history of heart disease?" (e.g., heart attack, angina, bypass surgery, angioplasty)      High BP 7. LUNG HISTORY: "Do you have any history of lung disease?"  (e.g., pulmonary embolus, asthma, emphysema)     *No Answer* 8. CAUSE: "What do you think is causing the breathing problem?"      High BP- not taking BP medications 9. OTHER SYMPTOMS: "Do you have any other symptoms? (e.g., dizziness, runny nose, cough, chest pain, fever)     dizziness 10. O2 SATURATION MONITOR:  "Do you use an oxygen saturation monitor (pulse oximeter) at home?" If Yes, "What is your reading (oxygen level) today?" "What is your usual oxygen saturation reading?" (e.g., 95%)       *No Answer* 11. PREGNANCY: "Is there any chance you are pregnant?" "When was your last menstrual period?"       *No Answer* 12. TRAVEL: "Have you traveled out of the country in the last month?" (e.g., travel history, exposures)       *No Answer*  Protocols used: Dizziness - Lightheadedness-A-AH, Breathing Difficulty-A-AH

## 2021-07-06 ENCOUNTER — Encounter (HOSPITAL_COMMUNITY)
Admission: EM | Disposition: A | Discharge status: Home / Self Care | Payer: Self-pay | Source: Home / Self Care | Attending: Emergency Medicine

## 2021-07-06 ENCOUNTER — Observation Stay (HOSPITAL_COMMUNITY): Payer: 59 | Admitting: Certified Registered Nurse Anesthetist

## 2021-07-06 ENCOUNTER — Encounter (HOSPITAL_COMMUNITY): Payer: Self-pay | Admitting: Internal Medicine

## 2021-07-06 DIAGNOSIS — D696 Thrombocytopenia, unspecified: Secondary | ICD-10-CM | POA: Diagnosis not present

## 2021-07-06 DIAGNOSIS — K269 Duodenal ulcer, unspecified as acute or chronic, without hemorrhage or perforation: Secondary | ICD-10-CM | POA: Diagnosis not present

## 2021-07-06 DIAGNOSIS — I129 Hypertensive chronic kidney disease with stage 1 through stage 4 chronic kidney disease, or unspecified chronic kidney disease: Secondary | ICD-10-CM | POA: Diagnosis not present

## 2021-07-06 DIAGNOSIS — N179 Acute kidney failure, unspecified: Secondary | ICD-10-CM | POA: Diagnosis not present

## 2021-07-06 DIAGNOSIS — Z79899 Other long term (current) drug therapy: Secondary | ICD-10-CM | POA: Diagnosis not present

## 2021-07-06 DIAGNOSIS — K2970 Gastritis, unspecified, without bleeding: Secondary | ICD-10-CM | POA: Diagnosis not present

## 2021-07-06 DIAGNOSIS — R69 Illness, unspecified: Secondary | ICD-10-CM | POA: Diagnosis not present

## 2021-07-06 DIAGNOSIS — D500 Iron deficiency anemia secondary to blood loss (chronic): Secondary | ICD-10-CM

## 2021-07-06 DIAGNOSIS — R739 Hyperglycemia, unspecified: Secondary | ICD-10-CM | POA: Diagnosis not present

## 2021-07-06 DIAGNOSIS — N189 Chronic kidney disease, unspecified: Secondary | ICD-10-CM | POA: Diagnosis not present

## 2021-07-06 DIAGNOSIS — K921 Melena: Secondary | ICD-10-CM | POA: Diagnosis not present

## 2021-07-06 DIAGNOSIS — F1020 Alcohol dependence, uncomplicated: Secondary | ICD-10-CM

## 2021-07-06 DIAGNOSIS — D509 Iron deficiency anemia, unspecified: Secondary | ICD-10-CM | POA: Diagnosis not present

## 2021-07-06 DIAGNOSIS — Z20822 Contact with and (suspected) exposure to covid-19: Secondary | ICD-10-CM | POA: Diagnosis not present

## 2021-07-06 DIAGNOSIS — I10 Essential (primary) hypertension: Secondary | ICD-10-CM | POA: Diagnosis not present

## 2021-07-06 DIAGNOSIS — R195 Other fecal abnormalities: Secondary | ICD-10-CM | POA: Diagnosis not present

## 2021-07-06 DIAGNOSIS — Z87891 Personal history of nicotine dependence: Secondary | ICD-10-CM | POA: Diagnosis not present

## 2021-07-06 DIAGNOSIS — D649 Anemia, unspecified: Secondary | ICD-10-CM

## 2021-07-06 DIAGNOSIS — Z86010 Personal history of colonic polyps: Secondary | ICD-10-CM

## 2021-07-06 HISTORY — PX: BIOPSY: SHX5522

## 2021-07-06 HISTORY — PX: ESOPHAGOGASTRODUODENOSCOPY (EGD) WITH PROPOFOL: SHX5813

## 2021-07-06 LAB — CBC
HCT: 23.4 % — ABNORMAL LOW (ref 39.0–52.0)
HCT: 24.6 % — ABNORMAL LOW (ref 39.0–52.0)
HCT: 24.7 % — ABNORMAL LOW (ref 39.0–52.0)
Hemoglobin: 7.6 g/dL — ABNORMAL LOW (ref 13.0–17.0)
Hemoglobin: 7.7 g/dL — ABNORMAL LOW (ref 13.0–17.0)
Hemoglobin: 7.8 g/dL — ABNORMAL LOW (ref 13.0–17.0)
MCH: 26.1 pg (ref 26.0–34.0)
MCH: 25.3 pg — ABNORMAL LOW (ref 26.0–34.0)
MCH: 25.6 pg — ABNORMAL LOW (ref 26.0–34.0)
MCHC: 32.5 g/dL (ref 30.0–36.0)
MCHC: 31.3 g/dL (ref 30.0–36.0)
MCHC: 31.6 g/dL (ref 30.0–36.0)
MCV: 80.4 fL (ref 80.0–100.0)
MCV: 80.9 fL (ref 80.0–100.0)
MCV: 81 fL (ref 80.0–100.0)
Platelets: 71 10*3/uL — ABNORMAL LOW (ref 150–400)
Platelets: 83 10*3/uL — ABNORMAL LOW (ref 150–400)
Platelets: 104 10*3/uL — ABNORMAL LOW (ref 150–400)
RBC: 2.91 MIL/uL — ABNORMAL LOW (ref 4.22–5.81)
RBC: 3.04 MIL/uL — ABNORMAL LOW (ref 4.22–5.81)
RBC: 3.05 MIL/uL — ABNORMAL LOW (ref 4.22–5.81)
RDW: 21.5 % — ABNORMAL HIGH (ref 11.5–15.5)
RDW: 21.4 % — ABNORMAL HIGH (ref 11.5–15.5)
RDW: 21.9 % — ABNORMAL HIGH (ref 11.5–15.5)
WBC: 10 10*3/uL (ref 4.0–10.5)
WBC: 10.1 10*3/uL (ref 4.0–10.5)
WBC: 8.9 10*3/uL (ref 4.0–10.5)
nRBC: 1.4 % — ABNORMAL HIGH (ref 0.0–0.2)
nRBC: 1.3 % — ABNORMAL HIGH (ref 0.0–0.2)
nRBC: 1.2 % — ABNORMAL HIGH (ref 0.0–0.2)

## 2021-07-06 LAB — COMPREHENSIVE METABOLIC PANEL
ALT: 29 U/L (ref 0–44)
AST: 90 U/L — ABNORMAL HIGH (ref 15–41)
Albumin: 3.6 g/dL (ref 3.5–5.0)
Alkaline Phosphatase: 31 U/L — ABNORMAL LOW (ref 38–126)
Anion gap: 9 (ref 5–15)
BUN: 10 mg/dL (ref 6–20)
CO2: 22 mmol/L (ref 22–32)
Calcium: 8.8 mg/dL — ABNORMAL LOW (ref 8.9–10.3)
Chloride: 103 mmol/L (ref 98–111)
Creatinine, Ser: 0.88 mg/dL (ref 0.61–1.24)
GFR, Estimated: >60 mL/min (ref >60)
Glucose, Bld: 153 mg/dL — ABNORMAL HIGH (ref 70–99)
Potassium: 4.1 mmol/L (ref 3.5–5.1)
Sodium: 134 mmol/L — ABNORMAL LOW (ref 135–145)
Total Bilirubin: 1.4 mg/dL — ABNORMAL HIGH (ref 0.3–1.2)
Total Protein: 7.4 g/dL (ref 6.5–8.1)

## 2021-07-06 LAB — TYPE AND SCREEN
ABO/RH(D): A POS
Antibody Screen: NEGATIVE
Unit division: 0
Unit division: 0

## 2021-07-06 LAB — RAPID URINE DRUG SCREEN, HOSP PERFORMED
Amphetamines: NOT DETECTED
Barbiturates: NOT DETECTED
Benzodiazepines: NOT DETECTED
Cocaine: NOT DETECTED
Opiates: NOT DETECTED
Tetrahydrocannabinol: NOT DETECTED

## 2021-07-06 LAB — BPAM RBC
Blood Product Expiration Date: 202301202359
Blood Product Expiration Date: 202301212359
ISSUE DATE / TIME: 202301031744
ISSUE DATE / TIME: 202301032059
Unit Type and Rh: 6200
Unit Type and Rh: 6200

## 2021-07-06 LAB — PHOSPHORUS: Phosphorus: 4.3 mg/dL (ref 2.5–4.6)

## 2021-07-06 LAB — TSH: TSH: 0.686 u[IU]/mL (ref 0.350–4.500)

## 2021-07-06 LAB — CBG MONITORING, ED: Glucose-Capillary: 176 mg/dL — ABNORMAL HIGH (ref 70–99)

## 2021-07-06 LAB — TROPONIN I (HIGH SENSITIVITY): Troponin I (High Sensitivity): 9 ng/L (ref <18)

## 2021-07-06 LAB — HIV ANTIBODY (ROUTINE TESTING W REFLEX): HIV Screen 4th Generation wRfx: NONREACTIVE

## 2021-07-06 LAB — HEMOGLOBIN A1C
Hgb A1c MFr Bld: 5.9 % — ABNORMAL HIGH (ref 4.8–5.6)
Mean Plasma Glucose: 123 mg/dL

## 2021-07-06 LAB — MAGNESIUM: Magnesium: 1.8 mg/dL (ref 1.7–2.4)

## 2021-07-06 SURGERY — ESOPHAGOGASTRODUODENOSCOPY (EGD) WITH PROPOFOL
Anesthesia: Monitor Anesthesia Care

## 2021-07-06 MED ORDER — PANTOPRAZOLE INFUSION (NEW) - SIMPLE MED
8.0000 mg/h | INTRAVENOUS | Status: DC
  Administered 2021-07-06: 8 mg/h via INTRAVENOUS
  Filled 2021-07-06: qty 80

## 2021-07-06 MED ORDER — PANTOPRAZOLE SODIUM 40 MG IV SOLR: 40.0000 mg | Freq: Two times a day (BID) | INTRAVENOUS | Status: DC

## 2021-07-06 MED ORDER — SODIUM CHLORIDE 0.9 % IV SOLN
250.0000 mg | Freq: Every day | INTRAVENOUS | Status: AC
  Administered 2021-07-06 – 2021-07-07 (×2): 250 mg via INTRAVENOUS
  Filled 2021-07-06 (×2): qty 20

## 2021-07-06 MED ORDER — PROPOFOL 10 MG/ML IV BOLUS
INTRAVENOUS | Status: DC | PRN
  Administered 2021-07-06 (×2): 20 mg via INTRAVENOUS
  Administered 2021-07-06: 30 mg via INTRAVENOUS
  Administered 2021-07-06: 20 mg via INTRAVENOUS

## 2021-07-06 MED ORDER — PROPOFOL 500 MG/50ML IV EMUL
INTRAVENOUS | Status: DC | PRN
  Administered 2021-07-06: 150 ug/kg/min via INTRAVENOUS

## 2021-07-06 MED ORDER — PANTOPRAZOLE SODIUM 40 MG PO TBEC
40.0000 mg | DELAYED_RELEASE_TABLET | Freq: Every day | ORAL | Status: DC
  Administered 2021-07-07: 40 mg via ORAL
  Filled 2021-07-06: qty 1

## 2021-07-06 MED ORDER — LIDOCAINE 2% (20 MG/ML) 5 ML SYRINGE
INTRAMUSCULAR | Status: DC | PRN
  Administered 2021-07-06: 100 mg via INTRAVENOUS

## 2021-07-06 SURGICAL SUPPLY — 15 items

## 2021-07-06 NOTE — Consult Note (Addendum)
[facility] Consult: 8:20 AM 07/06/2021  LOS: 0 days    Referring Provider: Dr Curly Rim  Primary Care Physician:  Ladell Pier, MD Primary Gastroenterologist:  Dr. [name]    Reason for Consultation:  symptomatic anemia.  FOBT positive.   HPI: Stephen Bowers is a 61 y.o. male.  PMH Htn.  HLD.  Thrombocytopenia dating as far back as 10/2016 when platelets measured 97K, but measured as high as 693 within 1 week, nadir level 47K in 04/2020.  Grade 1 diastolic dysfunction on echocardiogram 2019.  Gout.  Glucose intolerance.  Hyponatremia during admission for pancreatitis.  AKI during admission for pancreatitis. ETOH use disorder.  Alcohol related seizure.  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  However triglycerides were 1948 in 12/2017, 319 in 11/2018 and 87 in 04/2020.[name]  Fatty liver on ultrasound 11/2018. Multiple colon polyps (HP and TA), index colonoscopy in 2016. 03/2018 colonoscopy.  For polyp surveillance.  3 polyps removed (HP) located at rectum, sigmoid, transverse colon.  2 to 5 mm sized.  Small, nonbleeding internal hemorrhoids.  Seen at urgent care on 07/04/2021 for respiratory viral symptoms with cough, sore throat, headaches, nasal/chest congestion/purulent sputum, dizziness, SOB.  2 negative COVID tests.  Symptoms began a day or 2 before Christmas.  Initially saw a couple of black stools but then stools resumed normal brown color, mostly daily occurrence.  No change in appetite.  No nausea or vomiting.  Dizziness worse in the morning when he would get up out of bed. Pulse 106, blood pressure 163/104, no hypoxia or fever.  He had run out of blood pressure medication.  No cardiac ischemia on EKG.  Labs not obtained.  Advised to proceed to ED but chose to return home. Contacted PCP office the next day  with ongoing symptoms, complied with advised to proceed to ED.  No hypotension, no tachycardia, no bradycardia, no hypoxia, no fever.  Hgb 5.5 >> 2 PRBC >> 7.6.   was ranging between 10.2, 11.7 in October 2021. MCV 79.  Platelets 68K.  WBCs 12.3.  INR 1.1. Iron low at 15.  Iron sats low 3%.  TIBC elevated 515.  Ferritin low 5.  B12, folate normal. T bili 1.4.  Alk phos 31.  AST/ALT 90/29.  Glucose 153, average mean glucose 119.  Calcium low at 8.8.  BUN/creatinine, GFR normal. Abdominal ultrasound: Normal spleen.  No LUQ ascites.  Stable hepatic steatosis.  CBD 3 mm.  Normal PV Dopplers.  Protonix IV drip, Octreotide drip, albuterol inhalers initiated, transfused 2 PRBCs. Home meds include esomeprazole 20 mg daily.  As needed pain relief with acetaminophen, no NSAIDs or aspirin products. Feeling better after PRBCs.  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  Can go through a sixpack of beer and 5 airplane sized bottles of brandy in 2 to 3 days.  No illicit drugs.  No tobacco products.  Question of liver disease in his mother who had a history of alcohol  abuse.  There may be a family history of anemia but he is not sure.  Denies family history of sickle cell disease or trait.  No family history of GI bleeding, ulcers, colorectal cancer.    Past Medical History:  Diagnosis Date   Chronic musculoskeletal pain    CKD (chronic kidney disease)    follows with [facility] Hollie Salk)   High cholesterol Dx 2011   Hypertension    Pancolitis ([place])    Wears partial dentures    upper only    Past Surgical History:  Procedure Laterality Date   COLONOSCOPY  12/2014   hx polyps - Pyrtle   MULTIPLE TOOTH EXTRACTIONS      Prior to Admission medications   Medication Sig Start Date End Date Taking? Authorizing Provider  acetaminophen (TYLENOL) 650 MG CR tablet Take 650 mg by mouth daily.    Yes [provider]  amLODipine (NORVASC) 10 MG tablet TAKE 1 TABLET (10 MG TOTAL) BY MOUTH DAILY. Patient taking differently: Take 10 mg by mouth daily. 07/05/21 07/05/22 Yes Ladell Pier, MD  Ascorbic Acid (VITAMIN C PO) Take 1 tablet by mouth daily.   Yes [provider]  carvedilol (COREG) 12.5 MG tablet TAKE 1 TABLET (12.5 MG TOTAL) BY MOUTH 2 (TWO) TIMES DAILY WITH A MEAL. Patient taking differently: Take 12.5 mg by mouth 2 (two) times daily with a meal. 04/17/20 07/05/21 Yes Brimage, Vondra, DO  cholecalciferol (VITAMIN D3) 25 MCG (1000 UNIT) tablet Take 1,000 Units by mouth daily.   Yes [provider]  colchicine 0.6 MG tablet TAKE 1 TABLET BY MOUTH EVERY OTHER DAY ON MONDAY, [place], AND FRIDAY. Patient taking differently: Take 0.6 mg by mouth every other day. 05/07/20 07/05/21 Yes Ladell Pier, MD  Cyanocobalamin 3000 MCG CAPS Take 3,000 mcg by mouth daily.   Yes [provider]  ELDERBERRY PO Take 1 tablet by mouth daily.   Yes [provider]  esomeprazole (NEXIUM) 20 MG capsule Take 20 mg by mouth daily at 12 noon.   Yes [provider]  Levocetirizine Dihydrochloride (XYZAL PO) Take 1 tablet by mouth daily as needed (allergies).   Yes [provider]  Multiple Vitamin (MULTIVITAMIN WITH MINERALS) TABS tablet Take 1 tablet by mouth daily.   Yes [provider]  Multiple Vitamins-Minerals (ZINC PO) Take 1 tablet by mouth daily.   Yes [provider]  Tetrahydrozoline HCl (VISINE OP) Place 1 drop into both eyes daily as needed (dry eyes).    Yes [provider]  vitamin E 1000 UNIT capsule Take 1,000 Units by mouth daily.   Yes [provider]    Scheduled Meds:  folic acid  1 mg Oral Daily   multivitamin with minerals  1 tablet Oral Daily   thiamine  100 mg Oral Daily   Or   thiamine  100 mg Intravenous Daily   Infusions:  sodium chloride Stopped (07/05/21 1805)   sodium  chloride 75 mL/hr at 07/06/21 0804   octreotide  (SANDOSTATIN)    IV infusion 50 mcg/hr (07/06/21 0402)   PRN Meds: acetaminophen **OR** acetaminophen, albuterol, bisacodyl, hydrALAZINE, LORazepam **OR** LORazepam, ondansetron **OR** ondansetron (ZOFRAN) IV, oxyCODONE, polyethylene glycol   Allergies as of 07/05/2021   (No Known Allergies)    Family History  Problem Relation Age of Onset   Liver disease Father    Alcohol abuse Father    Diabetes Sister    Obesity Sister    Cancer Mother  breast    Kidney disease Mother    Colon cancer Neg Hx    Esophageal cancer Neg Hx    Rectal cancer Neg Hx    Stomach cancer Neg Hx    Colon polyps Neg Hx     Social History   Socioeconomic History   Marital status: Married    Spouse name: Malachy Mood   Number of children: 1    Years of education: 12    Highest education level: Not on file  Occupational History   Occupation: Unemployed   Tobacco Use   Smoking status: Former    Packs/day: 0.25    Years: 6.00    Pack years: 1.50    Types: Cigarettes    Quit date: 1995    Years since quitting: 28.0   Smokeless tobacco: Never  Vaping Use   Vaping Use: Never used  Substance and Sexual Activity   Alcohol use: Yes    Alcohol/week: 6.0 standard drinks    Types: 6 Cans of beer per week    Comment: 6 drinks Beer /Liquor   Drug use: No   Sexual activity: Yes  Other Topics Concern   Not on file  Social History Narrative   Lives in an [place], family house.    Child goes to [employer] in [place], 61 yo F,          Social Determinants of Health   Financial Resource Strain: Not on file  Food Insecurity: Not on file  Transportation Needs: Not on file  Physical Activity: Not on file  Stress: Not on file  Social Connections: Not on file  Intimate Partner Violence: Not on file    REVIEW OF SYSTEMS: Constitutional: Weakness, dizziness, fatigue. ENT:  No nose bleeds Pulm: DOE.  Productive cough. CV:  No palpitations, no LE  edema.  No angina. GU:  No hematuria, no frequency GI: Per HPI. Heme: Denies unusual or excessive bleeding or bruising. Transfusions: None before the last 24 hours. Neuro:  No headaches, no seizure.  Positive dizziness. Derm:  No itching, no rash or sores.  Endocrine:  No sweats or chills.  No polyuria or dysuria Immunization: Vaccinated twice for COVID-19. Travel: Spent some time at the beach before Christmas.  PHYSICAL EXAM: Vital signs in last 24 hours: Vitals:   07/06/21 0745 07/06/21 0803  BP: (!) 163/98 (!) 163/98  Pulse: 93 93  Resp: 15   Temp:    SpO2: 99%    Wt Readings from Last 3 Encounters:  07/05/21 81.6 kg  05/07/20 82.6 kg  04/22/20 79.4 kg    General: Pleasant, well-appearing gentleman in no distress. Head: No facial asymmetry or swelling.  No signs of head trauma. Eyes: No scleral icterus.  Conjunctiva pink.  EOMI. Ears: No hearing loss Nose: No congestion or discharge. Mouth: Some missing teeth but remaining dentition in good repair.  Mucous membranes moist, pink, clear.  Tongue midline. Neck: No thyromegaly, no JVD Lungs: Clear bilaterally with good breath sounds.  Occasional dry cough with deeper inspiration. Heart: RRR.  No MRG.  S1, S2 present. Abdomen: Soft without tenderness.  Bowel sounds active.  No distention.  No organomegaly appreciated.  No hernias, no bruits.   Rectal: Deferred Musc/Skeltl: No joint redness, swelling or gross deformity. Extremities: No CCE. Neurologic: Oriented x3.  Moves all 4 limbs without weakness.  No tremor. Skin: No rash, no sores, no suspicious lesions. Nodes: No cervical adenopathy Psych: Calm, pleasant, cooperative, engaged.  Intake/Output from previous day: 01/03 0701 -  01/04 0700 In: -  Out: 800 [Urine:800] Intake/Output this shift: No intake/output data recorded.  LAB RESULTS: Recent Labs    07/05/21 1427 07/06/21 0258  WBC 12.3* 10.0  HGB 5.5* 7.6*  HCT 18.0* 23.4*  PLT 68* 71*   BMET Lab  Results  Component Value Date   NA 134 (L) 07/06/2021   NA 131 (L) 07/05/2021   NA 139 04/22/2020   K 4.1 07/06/2021   K 3.5 07/05/2021   K 4.5 04/22/2020   CL 103 07/06/2021   CL 100 07/05/2021   CL 101 04/22/2020   CO2 22 07/06/2021   CO2 21 (L) 07/05/2021   CO2 24 04/22/2020   GLUCOSE 153 (H) 07/06/2021   GLUCOSE 134 (H) 07/05/2021   GLUCOSE 128 (H) 04/22/2020   BUN 10 07/06/2021   BUN 14 07/05/2021   BUN 8 04/22/2020   CREATININE 0.88 07/06/2021   CREATININE 1.25 (H) 07/05/2021   CREATININE 0.69 (L) 04/22/2020   CALCIUM 8.8 (L) 07/06/2021   CALCIUM 9.2 07/05/2021   CALCIUM 9.5 04/22/2020   LFT Recent Labs    07/05/21 1621 07/06/21 0258  PROT 8.6* 7.4  ALBUMIN 3.8 3.6  AST 71* 90*  ALT 31 29  ALKPHOS 29* 31*  BILITOT 0.5 1.4*  BILIDIR <0.1  --   IBILI NOT CALCULATED  --    PT/INR Lab Results  Component Value Date   INR 1.1 07/05/2021   INR 1.2 04/11/2020   INR 1.12 12/20/2017   Hepatitis Panel No results for input(s): HEPBSAG, HCVAB, HEPAIGM, HEPBIGM in the last 72 hours. C-Diff No components found for: CDIFF Lipase     Component Value Date/Time   LIPASE 26 07/05/2021 1621    Drugs of Abuse  No results found for: LABOPIA, COCAINSCRNUR, LABBENZ, AMPHETMU, THCU, LABBARB   RADIOLOGY STUDIES: DG Chest 2 View  Result Date: 07/05/2021 CLINICAL DATA:  Shortness of breath EXAM: CHEST - 2 VIEW COMPARISON:  Chest x-ray 04/15/2020 FINDINGS: Heart size and mediastinal contours are within normal limits. No suspicious pulmonary opacities identified. No pleural effusion or pneumothorax visualized. Healing/old rib fractures on the right. No acute osseous abnormality appreciated. IMPRESSION: No acute intrathoracic process identified. Electronically Signed   By: Ofilia Neas M.D.   On: 07/05/2021 14:48   US Abdomen Limited  Result Date: 07/05/2021 CLINICAL DATA:  Provided history: Alcoholic Assess for splenomegaly EXAM: ULTRASOUND ABDOMEN LIMITED COMPARISON:   None. FINDINGS: Sonographic evaluation of the spleen. The spleen measures 8.7 x 4.3 x 4.4 cm, volume of 85.1 cc. No splenomegaly. No focal splenic abnormality. No left upper quadrant ascites. IMPRESSION: Normal sonographic appearance of the spleen.  No splenomegaly. Electronically Signed   By: Keith Rake M.D.   On: 07/05/2021 18:51   US Abdomen Limited  Result Date: 07/05/2021 CLINICAL DATA:  Upper abdominal pain EXAM: ULTRASOUND ABDOMEN LIMITED RIGHT UPPER QUADRANT COMPARISON:  04/12/2020 FINDINGS: Gallbladder: No gallstones or wall thickening visualized. No sonographic Murphy sign noted by sonographer. Common bile duct: Diameter: 3 mm Liver: Diffuse increased liver echotexture most compatible with hepatic steatosis, stable. No intrahepatic duct dilation. Portal vein is patent on color Doppler imaging with normal direction of blood flow towards the liver. Other: None. IMPRESSION: 1. Stable hepatic steatosis. 2. Otherwise unremarkable exam. Electronically Signed   By: Randa Ngo M.D.   On: 07/05/2021 17:57     IMPRESSION:   FOBT positive iron deficiency anemia.  Had black stools around Christmas, 2 weeks ago but these resolved within 36 hours.  Compliant with daily esomeprazole.  No NSAIDs or aspirin products.  Rule out peptic ulcer disease, r/O varices, R/o portal hypertensive gastropathy.  R/oh neoplasia.  Excellent response to 2 PRBCs.  Hepatic steatosis, alcohol abuse.  Imaging does not confirm cirrhosis.  Mild elevation T bili and AST.  Thrombocytopenia, intermittent problem for several years.  No splenomegaly on current imaging.    Hx adenomatous and hyperplastic colon polyps.  Up-to-date on surveillance, last colonoscopy 03/2018.    Hyponatremia, noncritical.  Acute pancreatitis attributed to alcohol with admissions in 10/2017, 04/2019.   PLAN:     EGD planned for this afternoon.  N.p.o. though he can take meds with sips.  Discussed risks, benefits with patient and he is  agreeable to proceed.   Continue Protonix and octreotide drips for now.  CBC this PM and in AM.     Azucena Freed  07/06/2021, 8:20 AM Phone [phone]  GI ATTENDING  History, laboratories, x-rays, prior endoscopy reports reviewed.  Patient personally seen and examined.  Agree with comprehensive consultation note as outlined above.  Patient presents today with symptomatic interval anemia consistent with iron deficiency and reports what sounds like upper GI bleeding.  He is stable posttransfusion with appropriate response.  No active bleeding at this time.  On PPI.  He does have abnormal liver test and an alcohol injury related pattern but unlikely to have cirrhosis based on other clinical and ancillary data.  Agree with plans for EGD today.  If this does not identify a cause, then I would recommend discharge on iron replacement therapy and plans for short interval outpatient GI and colonoscopy and possible small bowel enteroscopy (if colonoscopy unrevealing).  The patient is higher than baseline risk given his comorbidities.The nature of the procedure, as well as the risks, benefits, and alternatives were carefully and thoroughly reviewed with the patient. Ample time for discussion and questions allowed. The patient understood, was satisfied, and agreed to proceed.  Docia Chuck. Geri Seminole., M.D. [facility] Division of Gastroenterology

## 2021-07-06 NOTE — Plan of Care (Signed)

## 2021-07-06 NOTE — ED Notes (Signed)
Pt resting comfortably at this time. Visible rise and fall of chest noted. Pt appears in NAD. VS WNL.

## 2021-07-06 NOTE — Anesthesia Preprocedure Evaluation (Addendum)
Anesthesia Evaluation  Patient identified by MRN, date of birth, ID band Patient awake    Reviewed: Allergy & Precautions, NPO status , Patient's Chart, lab work & pertinent test results  History of Anesthesia Complications Negative for: history of anesthetic complications  Airway Mallampati: II  TM Distance: >3 FB Neck ROM: Full    Dental  (+) Dental Advisory Given, Partial Upper   Pulmonary former smoker,    Pulmonary exam normal        Cardiovascular hypertension, Pt. on medications and Pt. on home beta blockers Normal cardiovascular exam     Neuro/Psych Seizures -,  negative psych ROS   GI/Hepatic PUD, (+)     substance abuse  alcohol use,   Endo/Other   Pre-DM   Renal/GU CRFRenal disease     Musculoskeletal  (+) Arthritis ,   Abdominal   Peds  Hematology  (+) anemia ,  Plt 83k H/H 7.7/24.6    Anesthesia Other Findings   Reproductive/Obstetrics                            Anesthesia Physical Anesthesia Plan  ASA: 3  Anesthesia Plan: MAC   Post-op Pain Management:    Induction:   PONV Risk Score and Plan: 1 and Propofol infusion and Treatment may vary due to age or medical condition  Airway Management Planned: Nasal Cannula and Natural Airway  Additional Equipment: None  Intra-op Plan:   Post-operative Plan:   Informed Consent: I have reviewed the patients History and Physical, chart, labs and discussed the procedure including the risks, benefits and alternatives for the proposed anesthesia with the patient or authorized representative who has indicated his/her understanding and acceptance.       Plan Discussed with: CRNA and Anesthesiologist  Anesthesia Plan Comments:        Anesthesia Quick Evaluation

## 2021-07-06 NOTE — Transfer of Care (Signed)
Immediate Anesthesia Transfer of Care Note  Patient: Stephen Bowers  Procedure(s) Performed: ESOPHAGOGASTRODUODENOSCOPY (EGD) WITH PROPOFOL BIOPSY  Patient Location: Endoscopy Unit  Anesthesia Type:MAC  Level of Consciousness: awake, alert  and oriented  Airway & Oxygen Therapy: Patient Spontanous Breathing and Patient connected to face mask oxygen  Post-op Assessment: Report given to RN and Post -op Vital signs reviewed and stable  Post vital signs: Reviewed and stable  Last Vitals:  Vitals Value Taken Time  BP 124/83 07/06/21 1635  Temp    Pulse 70 07/06/21 1637  Resp 18 07/06/21 1637  SpO2 100 % 07/06/21 1637  Vitals shown include unvalidated device data.  Last Pain:  Vitals:   07/06/21 1635  TempSrc:   PainSc: 0-No pain         Complications: No notable events documented.

## 2021-07-06 NOTE — Plan of Care (Signed)
  Problem: Activity: Goal: Risk for activity intolerance will decrease Outcome: Progressing   Problem: Nutrition: Goal: Adequate nutrition will be maintained Outcome: Progressing   Problem: Coping: Goal: Level of anxiety will decrease Outcome: Progressing   Problem: Elimination: Goal: Will not experience complications related to bowel motility Outcome: Progressing Goal: Will not experience complications related to urinary retention Outcome: Progressing   

## 2021-07-06 NOTE — Op Note (Signed)
[facility] Patient Name: Stephen Bowers Procedure Date : 07/06/2021 MRN: [id number] Attending MD: Docia Chuck. Henrene Pastor , MD Date of Birth: [birth year] CSN: [id number] Age: 61 Admit Type: Outpatient Procedure:                Upper GI endoscopy with biopsies Indications:              Iron deficiency anemia secondary to chronic blood                            loss, Heme positive stool, Melena Providers:                Docia Chuck. Henrene Pastor, MD, Dulcy Fanny, Lodema Hong                            Technician, Technician Referring MD:             [facility] hospitalist Medicines:                Monitored Anesthesia Care Complications:            No immediate complications. Estimated Blood Loss:     Estimated blood loss: none. Procedure:                Pre-Anesthesia Assessment:                           - Prior to the procedure, a History and Physical                            was performed, and patient medications and                            allergies were reviewed. The patient's tolerance of                            previous anesthesia was also reviewed. The risks                            and benefits of the procedure and the sedation                            options and risks were discussed with the patient.                            All questions were answered, and informed consent                            was obtained. Prior Anticoagulants: The patient has                            taken no previous anticoagulant or antiplatelet                            agents. ASA Grade Assessment: II - A patient with  mild systemic disease. After reviewing the risks                            and benefits, the patient was deemed in                            satisfactory condition to undergo the procedure.                           After obtaining informed consent, the endoscope was                            passed under direct vision. Throughout the                             procedure, the patient's blood pressure, pulse, and                            oxygen saturations were monitored continuously. The                            GIF-H190 ([id number]) Olympus endoscope was introduced                            through the mouth, and advanced to the second part                            of duodenum. The upper GI endoscopy was                            accomplished without difficulty. The patient                            tolerated the procedure well. Scope In: Scope Out: Findings:      The esophagus was normal.      The stomach revealed a few scattered erosions and a sliding hiatal       hernia (small without erosions). The stomach was otherwise normal.      One non-bleeding duodenal ulcer with no stigmata of bleeding was found       in the duodenal bulb. The lesion was 5 mm in largest dimension. Biopsies       were taken with a cold forceps for histology.      The examined duodenum was otherwise normal.      The cardia and gastric fundus were normal on retroflexion. Impression:               1. Duodenal ulcer without active bleeding or                            stigmata                           2. Gastric erosions                           3. GI  bleeding and iron deficiency anemia secondary                            to #1 and 2 above. Recommendation:           1. Pantoprazole 40 mg daily indefinitely                           2. Resume previous diet                           3. Avoid unnecessary aspirin and NSAIDs                           4. Stop drinking alcohol in excess                           5. Sent home on daily iron supplement. Ferrous                            sulfate 325 mg daily. Take 1 daily indefinitely                           6. He should have his blood counts and iron levels                            monitored with his PCP as outpatient                           7. No specific GI follow-up required for this                             problem. He does see Dr. [name] as needed for his                            GI care.                           Discussed with patient. He was provided a copy of                            this report. We will sign off. Procedure Code(s):        --- Professional ---                           [phone], Esophagogastroduodenoscopy, flexible,                            transoral; with biopsy, single or multiple Diagnosis Code(s):        --- Professional ---                           K26.9, Duodenal ulcer, unspecified as acute or  chronic, without hemorrhage or perforation                           D50.0, Iron deficiency anemia secondary to blood                            loss (chronic)                           R19.5, Other fecal abnormalities                           K92.1, Melena (includes Hematochezia) CPT copyright 2019 American Medical Association. All rights reserved. The codes documented in this report are preliminary and upon coder review may  be revised to meet current compliance requirements. Docia Chuck. Henrene Pastor, MD 07/06/2021 4:37:23 PM This report has been signed electronically. Number of Addenda: 0

## 2021-07-06 NOTE — Progress Notes (Signed)
[facility] PROGRESS NOTE   Stephen Bowers DJM:[id number] DOB: March 30, 1961 DOA: 07/05/2021  PCP: Ladell Pier, MD  Brief History/Interval Summary: 61 y.o. male with medical history significant for but not limited to chronic musculoskeletal pain, history of CKD, hyperlipidemia, hypertension, pancolitis as well as other comorbidities who presented with weakness associated with shortness of breath and dizziness as well as lightheadedness, cough and congestion.  Patient states the symptoms have been worsening over the last week to 10 days and he tested negative for COVID twice.  Patient also mentioned episodes of dark-colored stool but no blood in the stool or hematemesis.  Was found to have hemoglobin of 5.5.  Will hospitalize for further management.   Reason for Visit: Concern for upper GI bleed in the setting of alcoholism  Consultants: Gastroenterology  Procedures: EGD is planned for this afternoon    Subjective/Interval History: Patient denies any abdominal pain.  Occasional cough with clear expectoration.  No wheezing.  No chest pain.  No shortness of breath.  No fever or chills.     Assessment/Plan:  Symptomatic anemia/concern for acute on chronic blood loss anemia/iron deficiency Presented with hemoglobin of 5.5.  Stool for occult blood test was positive.  He mentioned dark stools at home prior to admission. Patient has been transfused 2 units of PRBC. Hemoglobin is responded to 7.6. Anemia panel suggest significant iron deficiency with ferritin of 5, TIBC 515, iron of 15.  He will benefit from iron infusion as well. Monitor hemoglobin closely.  Concern for GI bleeding Gastroenterology has been consulted.  Plan is for endoscopy later this afternoon.  Continue PPI.  He is also on octreotide due to his history of alcoholism.  Acute upper respiratory tract infection COVID-19 and influenza PCR negative.  Chest x-ray does not show any pneumonia.  Supportive  treatment.  Alcohol abuse Patient mentions that he drinks heavily only on the weekends.  Has about 6 beers over the weekend and 5-6 shots of brandy as well.  Monitor for withdrawal symptoms.  Abnormal LFTs Mildly elevated AST noted.  Hepatic steatosis noted on ultrasound.  Check hepatitis panel.  Thrombocytopenia Most likely due to suppression from alcohol.  Continue to monitor.  Essential hypertension Takes amlodipine and carvedilol prior to admission.  Currently on hold.  Hyponatremia Stable.  Likely due to alcoholism.  Hyperglycemia Monitor glucose levels daily.  Check HbA1c.  Metabolic acidosis Resolved.  Acute kidney injury Creatinine was elevated at admission.  Noted to be normal this morning.   DVT Prophylaxis: SCDs Code Status: Full code Family Communication: Discussed with patient Disposition Plan: Hopefully return home when improved  Status is: Observation  The patient will require care spanning > 2 midnights and should be moved to inpatient because: GI bleed     Medications: Scheduled:  folic acid  1 mg Oral Daily   multivitamin with minerals  1 tablet Oral Daily   thiamine  100 mg Oral Daily   Or   thiamine  100 mg Intravenous Daily   Continuous:  sodium chloride Stopped (07/05/21 1805)   sodium chloride 75 mL/hr at 07/06/21 0804   octreotide  (SANDOSTATIN)    IV infusion 50 mcg/hr (07/06/21 0402)   QIW:[id number] **OR** acetaminophen, albuterol, bisacodyl, hydrALAZINE, LORazepam **OR** LORazepam, ondansetron **OR** ondansetron (ZOFRAN) IV, oxyCODONE, polyethylene glycol  Antibiotics: Anti-infectives (From admission, onward)    None       Objective:  Vital Signs  Vitals:   07/06/21 0915 07/06/21 0930 07/06/21 0945 07/06/21 1000  BP: [name])  166/87 131/60 (!) 143/87 (!) 129/96  Pulse: 71 67 82 70  Resp: (!) 21 16 15 14   Temp:      TempSrc:      SpO2: 98% 99% 98% 99%  Weight:      Height:        Intake/Output Summary (Last 24  hours) at 07/06/2021 1027 Last data filed at 07/06/2021 0256 Gross per 24 hour  Intake --  Output 800 ml  Net -800 ml   Filed Weights   07/05/21 1359  Weight: 81.6 kg    General appearance: Awake alert.  In no distress Resp: Clear to auscultation bilaterally.  Normal effort Cardio: S1-S2 is normal regular.  No S3-S4.  No rubs murmurs or bruit GI: Abdomen is soft.  Nontender nondistended.  Bowel sounds are present normal.  No masses organomegaly Extremities: No edema.  Full range of motion of lower extremities. Neurologic: Alert and oriented x3.  No focal neurological deficits.    Lab Results:  Data Reviewed: I have personally reviewed following labs and imaging studies  CBC: Recent Labs  Lab 07/05/21 1427 07/06/21 0258  WBC 12.3* 10.0  HGB 5.5* 7.6*  HCT 18.0* 23.4*  MCV 79.6* 80.4  PLT 68* 71*    Basic Metabolic Panel: Recent Labs  Lab 07/05/21 1427 07/06/21 0258  NA 131* 134*  K 3.5 4.1  CL 100 103  CO2 21* 22  GLUCOSE 134* 153*  BUN 14 10  CREATININE 1.25* 0.88  CALCIUM 9.2 8.8*  MG  --  1.8  PHOS  --  4.3    GFR: Estimated Creatinine Clearance: 91.5 mL/min (by C-G formula based on SCr of 0.88 mg/dL).  Liver Function Tests: Recent Labs  Lab 07/05/21 1621 07/06/21 0258  AST 71* 90*  ALT 31 29  ALKPHOS 29* 31*  BILITOT 0.5 1.4*  PROT 8.6* 7.4  ALBUMIN 3.8 3.6    Recent Labs  Lab 07/05/21 1621  LIPASE 26    Coagulation Profile: Recent Labs  Lab 07/05/21 1621  INR 1.1     CBG: Recent Labs  Lab 07/06/21 0801  GLUCAP 176*      Thyroid Function Tests: Recent Labs    07/06/21 0258  TSH 0.686    Anemia Panel: Recent Labs    07/05/21 1532 07/05/21 1621  VITAMINB12  --  1,222*  FOLATE  --  11.4  FERRITIN  --  5*  TIBC  --  515*  IRON  --  15*  RETICCTPCT 3.7*  --     Recent Results (from the past 240 hour(s))  Resp Panel by RT-PCR (Flu A&B, Covid) Nasopharyngeal Swab     Status: None   Collection Time: 07/05/21   3:55 PM   Specimen: Nasopharyngeal Swab; Nasopharyngeal(NP) swabs in vial transport medium  Result Value Ref Range Status   SARS Coronavirus 2 by RT PCR NEGATIVE NEGATIVE Final    Comment: (NOTE) SARS-CoV-2 target nucleic acids are NOT DETECTED.  The SARS-CoV-2 RNA is generally detectable in upper respiratory specimens during the acute phase of infection. The lowest concentration of SARS-CoV-2 viral copies this assay can detect is 138 copies/mL. A negative result does not preclude SARS-Cov-2 infection and should not be used as the sole basis for treatment or other patient management decisions. A negative result may occur with  improper specimen collection/handling, submission of specimen other than nasopharyngeal swab, presence of viral mutation(s) within the areas targeted by this assay, and inadequate number of viral copies(<138 copies/mL). A negative result  must be combined with clinical observations, patient history, and epidemiological information. The expected result is Negative.  Fact Sheet for Patients:  EntrepreneurPulse.com.au  Fact Sheet for Healthcare Providers:  IncredibleEmployment.be  This test is no t yet approved or cleared by the [place] FDA and  has been authorized for detection and/or diagnosis of SARS-CoV-2 by FDA under an Emergency Use Authorization (EUA). This EUA will remain  in effect (meaning this test can be used) for the duration of the COVID-19 declaration under Section 564(b)(1) of the Act, 21 U.S.C.section 360bbb-3(b)(1), unless the authorization is terminated  or revoked sooner.       Influenza A by PCR NEGATIVE NEGATIVE Final   Influenza B by PCR NEGATIVE NEGATIVE Final    Comment: (NOTE) The Xpert Xpress SARS-CoV-2/FLU/RSV plus assay is intended as an aid in the diagnosis of influenza from Nasopharyngeal swab specimens and should not be used as a sole basis for treatment. Nasal washings and aspirates  are unacceptable for Xpert Xpress SARS-CoV-2/FLU/RSV testing.  Fact Sheet for Patients: EntrepreneurPulse.com.au  Fact Sheet for Healthcare Providers: IncredibleEmployment.be  This test is not yet approved or cleared by the [place] FDA and has been authorized for detection and/or diagnosis of SARS-CoV-2 by FDA under an Emergency Use Authorization (EUA). This EUA will remain in effect (meaning this test can be used) for the duration of the COVID-19 declaration under Section 564(b)(1) of the Act, 21 U.S.C. section 360bbb-3(b)(1), unless the authorization is terminated or revoked.  Performed at [facility], [place] [address], [place], [place] [address]   Respiratory (~20 pathogens) panel by PCR     Status: None   Collection Time: 07/05/21  3:55 PM   Specimen: Nasopharyngeal Swab; Respiratory  Result Value Ref Range Status   Adenovirus NOT DETECTED NOT DETECTED Final   Coronavirus 229E NOT DETECTED NOT DETECTED Final    Comment: (NOTE) The Coronavirus on the Respiratory Panel, DOES NOT test for the novel  Coronavirus (2019 nCoV)    Coronavirus HKU1 NOT DETECTED NOT DETECTED Final   Coronavirus NL63 NOT DETECTED NOT DETECTED Final   Coronavirus OC43 NOT DETECTED NOT DETECTED Final   Metapneumovirus NOT DETECTED NOT DETECTED Final   Rhinovirus / Enterovirus NOT DETECTED NOT DETECTED Final   Influenza A NOT DETECTED NOT DETECTED Final   Influenza B NOT DETECTED NOT DETECTED Final   Parainfluenza Virus 1 NOT DETECTED NOT DETECTED Final   Parainfluenza Virus 2 NOT DETECTED NOT DETECTED Final   Parainfluenza Virus 3 NOT DETECTED NOT DETECTED Final   Parainfluenza Virus 4 NOT DETECTED NOT DETECTED Final   Respiratory Syncytial Virus NOT DETECTED NOT DETECTED Final   Bordetella pertussis NOT DETECTED NOT DETECTED Final   Bordetella Parapertussis NOT DETECTED NOT DETECTED Final   Chlamydophila pneumoniae NOT DETECTED NOT DETECTED Final    Mycoplasma pneumoniae NOT DETECTED NOT DETECTED Final    Comment: Performed at [facility], [place]. [address], [place], [place] [address]      Radiology Studies: DG Chest 2 View  Result Date: 07/05/2021 CLINICAL DATA:  Shortness of breath EXAM: CHEST - 2 VIEW COMPARISON:  Chest x-ray 04/15/2020 FINDINGS: Heart size and mediastinal contours are within normal limits. No suspicious pulmonary opacities identified. No pleural effusion or pneumothorax visualized. Healing/old rib fractures on the right. No acute osseous abnormality appreciated. IMPRESSION: No acute intrathoracic process identified. Electronically Signed   By: Ofilia Neas M.D.   On: 07/05/2021 14:48   US Abdomen Limited  Result Date: 07/05/2021  CLINICAL DATA:  Provided history: Alcoholic Assess for splenomegaly EXAM: ULTRASOUND ABDOMEN LIMITED COMPARISON:  None. FINDINGS: Sonographic evaluation of the spleen. The spleen measures 8.7 x 4.3 x 4.4 cm, volume of 85.1 cc. No splenomegaly. No focal splenic abnormality. No left upper quadrant ascites. IMPRESSION: Normal sonographic appearance of the spleen.  No splenomegaly. Electronically Signed   By: Keith Rake M.D.   On: 07/05/2021 18:51   US Abdomen Limited  Result Date: 07/05/2021 CLINICAL DATA:  Upper abdominal pain EXAM: ULTRASOUND ABDOMEN LIMITED RIGHT UPPER QUADRANT COMPARISON:  04/12/2020 FINDINGS: Gallbladder: No gallstones or wall thickening visualized. No sonographic Murphy sign noted by sonographer. Common bile duct: Diameter: 3 mm Liver: Diffuse increased liver echotexture most compatible with hepatic steatosis, stable. No intrahepatic duct dilation. Portal vein is patent on color Doppler imaging with normal direction of blood flow towards the liver. Other: None. IMPRESSION: 1. Stable hepatic steatosis. 2. Otherwise unremarkable exam. Electronically Signed   By: Randa Ngo M.D.   On: 07/05/2021 17:57       LOS: 0 days   [place]  [facility] Pager on [email or web address]  07/06/2021, 10:27 AM

## 2021-07-06 NOTE — Anesthesia Postprocedure Evaluation (Signed)
Anesthesia Post Note  Patient: Stephen Bowers  Procedure(s) Performed: ESOPHAGOGASTRODUODENOSCOPY (EGD) WITH PROPOFOL BIOPSY     Patient location during evaluation: PACU Anesthesia Type: MAC Level of consciousness: awake and alert Pain management: pain level controlled Vital Signs Assessment: post-procedure vital signs reviewed and stable Respiratory status: spontaneous breathing, nonlabored ventilation and respiratory function stable Cardiovascular status: stable and blood pressure returned to baseline Anesthetic complications: no   No notable events documented.  Last Vitals:  Vitals:   07/06/21 1645 07/06/21 1655  BP: (!) 152/84 (!) 159/91  Pulse: 69 71  Resp: 16 12  Temp:    SpO2: 100% 100%    Last Pain:  Vitals:   07/06/21 1655  TempSrc:   PainSc: 0-No pain                 Audry Pili

## 2021-07-07 ENCOUNTER — Ambulatory Visit: Payer: 59 | Admitting: Internal Medicine

## 2021-07-07 DIAGNOSIS — D649 Anemia, unspecified: Secondary | ICD-10-CM | POA: Diagnosis not present

## 2021-07-07 LAB — CBC
HCT: 25 % — ABNORMAL LOW (ref 39.0–52.0)
Hemoglobin: 7.8 g/dL — ABNORMAL LOW (ref 13.0–17.0)
MCH: 25.4 pg — ABNORMAL LOW (ref 26.0–34.0)
MCHC: 31.2 g/dL (ref 30.0–36.0)
MCV: 81.4 fL (ref 80.0–100.0)
Platelets: 120 10*3/uL — ABNORMAL LOW (ref 150–400)
RBC: 3.07 MIL/uL — ABNORMAL LOW (ref 4.22–5.81)
RDW: 22.6 % — ABNORMAL HIGH (ref 11.5–15.5)
WBC: 10.8 10*3/uL — ABNORMAL HIGH (ref 4.0–10.5)
nRBC: 0.8 % — ABNORMAL HIGH (ref 0.0–0.2)

## 2021-07-07 LAB — COMPREHENSIVE METABOLIC PANEL
ALT: 30 U/L (ref 0–44)
AST: 96 U/L — ABNORMAL HIGH (ref 15–41)
Albumin: 3.4 g/dL — ABNORMAL LOW (ref 3.5–5.0)
Alkaline Phosphatase: 28 U/L — ABNORMAL LOW (ref 38–126)
Anion gap: 8 (ref 5–15)
BUN: 8 mg/dL (ref 6–20)
CO2: 24 mmol/L (ref 22–32)
Calcium: 8.6 mg/dL — ABNORMAL LOW (ref 8.9–10.3)
Chloride: 102 mmol/L (ref 98–111)
Creatinine, Ser: 0.74 mg/dL (ref 0.61–1.24)
GFR, Estimated: >60 mL/min (ref >60)
Glucose, Bld: 124 mg/dL — ABNORMAL HIGH (ref 70–99)
Potassium: 3.4 mmol/L — ABNORMAL LOW (ref 3.5–5.1)
Sodium: 134 mmol/L — ABNORMAL LOW (ref 135–145)
Total Bilirubin: 0.6 mg/dL (ref 0.3–1.2)
Total Protein: 7.6 g/dL (ref 6.5–8.1)

## 2021-07-07 LAB — HEPATITIS PANEL, ACUTE
HCV Ab: NONREACTIVE
Hep A IgM: NONREACTIVE
Hep B C IgM: NONREACTIVE
Hepatitis B Surface Ag: NONREACTIVE

## 2021-07-07 LAB — GLUCOSE, CAPILLARY: Glucose-Capillary: 117 mg/dL — ABNORMAL HIGH (ref 70–99)

## 2021-07-07 LAB — MAGNESIUM: Magnesium: 1.8 mg/dL (ref 1.7–2.4)

## 2021-07-07 MED ORDER — POTASSIUM CHLORIDE CRYS ER 20 MEQ PO TBCR
40.0000 meq | EXTENDED_RELEASE_TABLET | Freq: Once | ORAL | Status: AC
Start: 2021-07-07 — End: 2021-07-07
  Administered 2021-07-07: 40 meq via ORAL
  Filled 2021-07-07: qty 2

## 2021-07-07 MED ORDER — FOLIC ACID 1 MG PO TABS
1.0000 mg | ORAL_TABLET | Freq: Every day | ORAL | 1 refills | Status: DC
  Filled 2021-07-07 (×2): qty 30, 30d supply, fill #0

## 2021-07-07 MED ORDER — THIAMINE HCL 100 MG PO TABS
100.0000 mg | ORAL_TABLET | Freq: Every day | ORAL | 1 refills | Status: DC
  Filled 2021-07-07 (×2): qty 30, 30d supply, fill #0

## 2021-07-07 MED ORDER — PANTOPRAZOLE SODIUM 40 MG PO TBEC
40.0000 mg | DELAYED_RELEASE_TABLET | Freq: Every day | ORAL | 3 refills | Status: DC
  Filled 2021-07-07 (×2): qty 30, 30d supply, fill #0

## 2021-07-07 MED ORDER — FERROUS SULFATE 325 (65 FE) MG PO TABS
325.0000 mg | ORAL_TABLET | Freq: Two times a day (BID) | ORAL | 3 refills | Status: DC
  Filled 2021-07-07 (×2): qty 60, 30d supply, fill #0

## 2021-07-07 NOTE — Plan of Care (Signed)

## 2021-07-07 NOTE — Progress Notes (Signed)
Avs provided and reviewed with pt. All questions answered at this time

## 2021-07-07 NOTE — Plan of Care (Signed)

## 2021-07-08 ENCOUNTER — Telehealth: Payer: Self-pay

## 2021-07-08 ENCOUNTER — Encounter (HOSPITAL_COMMUNITY): Payer: Self-pay | Admitting: Internal Medicine

## 2021-07-08 ENCOUNTER — Other Ambulatory Visit: Payer: Self-pay

## 2021-07-08 LAB — SURGICAL PATHOLOGY

## 2021-07-08 NOTE — Discharge Summary (Signed)
[facility]  Physician Discharge Summary   Patient ID: BLESS BELSHE MRN: [id number] DOB/AGE: 1960-08-21 61 y.o.  Admit date: 07/05/2021 Discharge date: 07/07/2021    PCP: Ladell Pier, MD  DISCHARGE DIAGNOSES:  Symptomatic anemia status post blood transfusion Acute on chronic GI blood loss Iron deficiency Gastritis Peptic ulcer disease Acute URTI, improved History of alcoholism Abnormal LFTs Essential hypertension   RECOMMENDATIONS FOR OUTPATIENT FOLLOW UP: Patient told to stop drinking alcohol Patient told to avoid NSAIDs   Home Health: None Equipment/Devices: None  CODE STATUS: Full code  DISCHARGE CONDITION: fair  Diet recommendation: Heart healthy  INITIAL HISTORY: 60 y.o. male with medical history significant for but not limited to chronic musculoskeletal pain, history of CKD, hyperlipidemia, hypertension, pancolitis as well as other comorbidities who presented with weakness associated with shortness of breath and dizziness as well as lightheadedness, cough and congestion.  Patient states the symptoms have been worsening over the last week to 10 days and he tested negative for COVID twice.  Patient also mentioned episodes of dark-colored stool but no blood in the stool or hematemesis.  Was found to have hemoglobin of 5.5.  Will hospitalize for further management.     Consultants: Gastroenterology   Procedures:  EGD  Impression:               1. Duodenal ulcer without active bleeding or                            stigmata                           2. Gastric erosions                           3. GI bleeding and iron deficiency anemia secondary                            to #1 and 2 above.    HOSPITAL COURSE:   Symptomatic anemia/concern for acute on chronic blood loss anemia/iron deficiency Presented with hemoglobin of 5.5.  Stool for occult blood test was positive.  He mentioned dark stools at home prior to admission. Patient has been  transfused 2 units of PRBC. Hemoglobin responded to 7.6. Anemia panel suggest significant iron deficiency with ferritin of 5, TIBC 515, iron of 15.  Patient was given iron infusion as well.  Hemoglobin stable.  He will be discharged on iron supplements. Asked to follow-up with his PCP in 1 week.  Acute gastritis/peptic ulcer disease Gastroenterology was consulted.  Patient underwent EGD which showed duodenal ulcer without active bleeding.  Gastric erosions were also noted.  To avoid NSAIDs and alcohol.  He will be discharged on PPI once a day.   Acute upper respiratory tract infection COVID-19 and influenza PCR negative.  Chest x-ray does not show any pneumonia.  Symptoms have improved.   Alcohol abuse Patient mentions that he drinks heavily only on the weekends.  Has about 6 beers over the weekend and 5-6 shots of brandy as well.  Did not have any withdrawal symptoms.  He was counseled.   Abnormal LFTs Mildly elevated AST noted.  Hepatic steatosis noted on ultrasound.  Hepatitis panel was negative.   Thrombocytopenia Most likely due to suppression from alcohol.  Counts are stable.  Patient was counseled about  cessation of alcohol use.  Essential hypertension Resume home medication HbA1c 5.9   Hyponatremia Stable.  Likely due to alcoholism.  Hyperglycemia Monitor glucose levels daily.  Check HbA1c.   Metabolic acidosis Resolved.  Acute kidney injury Resolved    Patient is stable.  Okay for discharge home today.   PERTINENT LABS:  The results of significant diagnostics from this hospitalization (including imaging, microbiology, ancillary and laboratory) are listed below for reference.    Microbiology: Recent Results (from the past 240 hour(s))  Resp Panel by RT-PCR (Flu A&B, Covid) Nasopharyngeal Swab     Status: None   Collection Time: 07/05/21  3:55 PM   Specimen: Nasopharyngeal Swab; Nasopharyngeal(NP) swabs in vial transport medium  Result Value Ref Range Status    SARS Coronavirus 2 by RT PCR NEGATIVE NEGATIVE Final    Comment: (NOTE) SARS-CoV-2 target nucleic acids are NOT DETECTED.  The SARS-CoV-2 RNA is generally detectable in upper respiratory specimens during the acute phase of infection. The lowest concentration of SARS-CoV-2 viral copies this assay can detect is 138 copies/mL. A negative result does not preclude SARS-Cov-2 infection and should not be used as the sole basis for treatment or other patient management decisions. A negative result may occur with  improper specimen collection/handling, submission of specimen other than nasopharyngeal swab, presence of viral mutation(s) within the areas targeted by this assay, and inadequate number of viral copies(<138 copies/mL). A negative result must be combined with clinical observations, patient history, and epidemiological information. The expected result is Negative.  Fact Sheet for Patients:  EntrepreneurPulse.com.au  Fact Sheet for Healthcare Providers:  IncredibleEmployment.be  This test is no t yet approved or cleared by the [place] FDA and  has been authorized for detection and/or diagnosis of SARS-CoV-2 by FDA under an Emergency Use Authorization (EUA). This EUA will remain  in effect (meaning this test can be used) for the duration of the COVID-19 declaration under Section 564(b)(1) of the Act, 21 U.S.C.section 360bbb-3(b)(1), unless the authorization is terminated  or revoked sooner.       Influenza A by PCR NEGATIVE NEGATIVE Final   Influenza B by PCR NEGATIVE NEGATIVE Final    Comment: (NOTE) The Xpert Xpress SARS-CoV-2/FLU/RSV plus assay is intended as an aid in the diagnosis of influenza from Nasopharyngeal swab specimens and should not be used as a sole basis for treatment. Nasal washings and aspirates are unacceptable for Xpert Xpress SARS-CoV-2/FLU/RSV testing.  Fact Sheet for  Patients: EntrepreneurPulse.com.au  Fact Sheet for Healthcare Providers: IncredibleEmployment.be  This test is not yet approved or cleared by the [place] FDA and has been authorized for detection and/or diagnosis of SARS-CoV-2 by FDA under an Emergency Use Authorization (EUA). This EUA will remain in effect (meaning this test can be used) for the duration of the COVID-19 declaration under Section 564(b)(1) of the Act, 21 U.S.C. section 360bbb-3(b)(1), unless the authorization is terminated or revoked.  Performed at [facility], [place] [address] [place], [place] [address]   Respiratory (~20 pathogens) panel by PCR     Status: None   Collection Time: 07/05/21  3:55 PM   Specimen: Nasopharyngeal Swab; Respiratory  Result Value Ref Range Status   Adenovirus NOT DETECTED NOT DETECTED Final   Coronavirus 229E NOT DETECTED NOT DETECTED Final    Comment: (NOTE) The Coronavirus on the Respiratory Panel, DOES NOT test for the novel  Coronavirus (2019 nCoV)    Coronavirus HKU1 NOT DETECTED NOT DETECTED Final   Coronavirus NL63 NOT  DETECTED NOT DETECTED Final   Coronavirus OC43 NOT DETECTED NOT DETECTED Final   Metapneumovirus NOT DETECTED NOT DETECTED Final   Rhinovirus / Enterovirus NOT DETECTED NOT DETECTED Final   Influenza A NOT DETECTED NOT DETECTED Final   Influenza B NOT DETECTED NOT DETECTED Final   Parainfluenza Virus 1 NOT DETECTED NOT DETECTED Final   Parainfluenza Virus 2 NOT DETECTED NOT DETECTED Final   Parainfluenza Virus 3 NOT DETECTED NOT DETECTED Final   Parainfluenza Virus 4 NOT DETECTED NOT DETECTED Final   Respiratory Syncytial Virus NOT DETECTED NOT DETECTED Final   Bordetella pertussis NOT DETECTED NOT DETECTED Final   Bordetella Parapertussis NOT DETECTED NOT DETECTED Final   Chlamydophila pneumoniae NOT DETECTED NOT DETECTED Final   Mycoplasma pneumoniae NOT DETECTED NOT DETECTED Final    Comment: Performed at  [facility], [place] [address], [place], [place] [address]     Labs:  COVID-19 Labs  Recent Labs    07/05/21 1621  FERRITIN 5*    Lab Results  Component Value Date   SARSCOV2NAA NEGATIVE 07/05/2021   [place] NEGATIVE 04/16/2020   [place] NEGATIVE 04/11/2020   [place] NEGATIVE 11/23/2018      Basic Metabolic Panel: Recent Labs  Lab 07/05/21 1427 07/06/21 0258 07/07/21 0303  NA 131* 134* 134*  K 3.5 4.1 3.4*  CL 100 103 102  CO2 21* 22 24  GLUCOSE 134* 153* 124*  BUN 14 10 8   CREATININE 1.25* 0.88 0.74  CALCIUM 9.2 8.8* 8.6*  MG  --  1.8 1.8  PHOS  --  4.3  --    Liver Function Tests: Recent Labs  Lab 07/05/21 1621 07/06/21 0258 07/07/21 0303  AST 71* 90* 96*  ALT 31 29 30   ALKPHOS 29* 31* 28*  BILITOT 0.5 1.4* 0.6  PROT 8.6* 7.4 7.6  ALBUMIN 3.8 3.6 3.4*   Recent Labs  Lab 07/05/21 1621  LIPASE 26    CBC: Recent Labs  Lab 07/05/21 1427 07/06/21 0258 07/06/21 0958 07/06/21 1813 07/07/21 0303  WBC 12.3* 10.0 10.1 8.9 10.8*  HGB 5.5* 7.6* 7.7* 7.8* 7.8*  HCT 18.0* 23.4* 24.6* 24.7* 25.0*  MCV 79.6* 80.4 80.9 81.0 81.4  PLT 68* 71* 83* 104* 120*    BNP: BNP (last 3 results) Recent Labs    07/05/21 1427  BNP 52.4    CBG: Recent Labs  Lab 07/06/21 0801 07/07/21 0800  GLUCAP 176* 117*     IMAGING STUDIES DG Chest 2 View  Result Date: 07/05/2021 CLINICAL DATA:  Shortness of breath EXAM: CHEST - 2 VIEW COMPARISON:  Chest x-ray 04/15/2020 FINDINGS: Heart size and mediastinal contours are within normal limits. No suspicious pulmonary opacities identified. No pleural effusion or pneumothorax visualized. Healing/old rib fractures on the right. No acute osseous abnormality appreciated. IMPRESSION: No acute intrathoracic process identified. Electronically Signed   By: Ofilia Neas M.D.   On: 07/05/2021 14:48   US Abdomen Limited  Result Date: 07/05/2021 CLINICAL DATA:  Provided history: Alcoholic Assess for  splenomegaly EXAM: ULTRASOUND ABDOMEN LIMITED COMPARISON:  None. FINDINGS: Sonographic evaluation of the spleen. The spleen measures 8.7 x 4.3 x 4.4 cm, volume of 85.1 cc. No splenomegaly. No focal splenic abnormality. No left upper quadrant ascites. IMPRESSION: Normal sonographic appearance of the spleen.  No splenomegaly. Electronically Signed   By: Keith Rake M.D.   On: 07/05/2021 18:51   US Abdomen Limited  Result Date: 07/05/2021 CLINICAL DATA:  Upper abdominal pain EXAM: ULTRASOUND ABDOMEN LIMITED RIGHT  UPPER QUADRANT COMPARISON:  04/12/2020 FINDINGS: Gallbladder: No gallstones or wall thickening visualized. No sonographic Murphy sign noted by sonographer. Common bile duct: Diameter: 3 mm Liver: Diffuse increased liver echotexture most compatible with hepatic steatosis, stable. No intrahepatic duct dilation. Portal vein is patent on color Doppler imaging with normal direction of blood flow towards the liver. Other: None. IMPRESSION: 1. Stable hepatic steatosis. 2. Otherwise unremarkable exam. Electronically Signed   By: Randa Ngo M.D.   On: 07/05/2021 17:57    DISCHARGE EXAMINATION: Vitals:   07/06/21 1645 07/06/21 1655 07/06/21 2204 07/07/21 0801  BP: (!) 152/84 (!) 159/91 (!) 141/85 136/89  Pulse: 69 71 72 73  Resp: 16 12 16 16   Temp:   98 F (36.7 C) 98.4 F (36.9 C)  TempSrc:   Oral Oral  SpO2: 100% 100% 99% 100%  Weight:      Height:       General appearance: Awake alert.  In no distress Resp: Clear to auscultation bilaterally.  Normal effort Cardio: S1-S2 is normal regular.  No S3-S4.  No rubs murmurs or bruit GI: Abdomen is soft.  Nontender nondistended.  Bowel sounds are present normal.  No masses organomegaly    DISPOSITION: Home  Discharge Instructions     Call MD for:  difficulty breathing, headache or visual disturbances   Complete by: As directed    Call MD for:  extreme fatigue   Complete by: As directed    Call MD for:  persistant dizziness or  light-headedness   Complete by: As directed    Call MD for:  persistant nausea and vomiting   Complete by: As directed    Call MD for:  severe uncontrolled pain   Complete by: As directed    Call MD for:  temperature >100.4   Complete by: As directed    Diet - low sodium heart healthy   Complete by: As directed    Discharge instructions   Complete by: As directed    Please take your medications as prescribed.  Please stop consuming alcohol.  Be sure to follow-up with your primary care provider.  Continue taking iron supplements.  You will need to have blood work done in a week or so to make sure your hemoglobin is stable.  You were cared for by a hospitalist during your hospital stay. If you have any questions about your discharge medications or the care you received while you were in the hospital after you are discharged, you can call the unit and asked to speak with the hospitalist on call if the hospitalist that took care of you is not available. Once you are discharged, your primary care physician will handle any further medical issues. Please note that NO REFILLS for any discharge medications will be authorized once you are discharged, as it is imperative that you return to your primary care physician (or establish a relationship with a primary care physician if you do not have one) for your aftercare needs so that they can reassess your need for medications and monitor your lab values. If you do not have a primary care physician, you can call [phone] for a physician referral.   Increase activity slowly   Complete by: As directed           Allergies as of 07/07/2021   No Known Allergies      Medication List     STOP taking these medications    esomeprazole 20 MG capsule Commonly known as: NEXIUM  TAKE these medications    acetaminophen 650 MG CR tablet Commonly known as: TYLENOL Take 650 mg by mouth daily.   amLODipine 10 MG tablet Commonly known as:  NORVASC TAKE 1 TABLET (10 MG TOTAL) BY MOUTH DAILY.   carvedilol 12.5 MG tablet Commonly known as: COREG TAKE 1 TABLET (12.5 MG TOTAL) BY MOUTH 2 (TWO) TIMES DAILY WITH A MEAL. What changed: how much to take   cholecalciferol 25 MCG (1000 UNIT) tablet Commonly known as: VITAMIN D3 Take 1,000 Units by mouth daily.   colchicine 0.6 MG tablet TAKE 1 TABLET BY MOUTH EVERY OTHER DAY ON MONDAY, WEDNESDAY, AND FRIDAY. What changed:  how much to take how to take this when to take this   Cyanocobalamin 3000 MCG Caps Take 3,000 mcg by mouth daily.   ELDERBERRY PO Take 1 tablet by mouth daily.   ferrous sulfate 325 (65 FE) MG tablet Take 1 tablet (325 mg total) by mouth 2 (two) times daily with a meal.   folic acid 1 MG tablet Commonly known as: FOLVITE Take 1 tablet (1 mg total) by mouth daily.   multivitamin with minerals Tabs tablet Take 1 tablet by mouth daily.   pantoprazole 40 MG tablet Commonly known as: PROTONIX Take 1 tablet (40 mg total) by mouth daily at 6 (six) AM.   thiamine 100 MG tablet Take 1 tablet (100 mg total) by mouth daily.   VISINE OP Place 1 drop into both eyes daily as needed (dry eyes).   VITAMIN C PO Take 1 tablet by mouth daily.   vitamin E 1000 UNIT capsule Take 1,000 Units by mouth daily.   XYZAL PO Take 1 tablet by mouth daily as needed (allergies).   ZINC PO Take 1 tablet by mouth daily.          Follow-up Information     Ladell Pier, MD. Schedule an appointment as soon as possible for a visit in 1 week(s).   Specialty: Internal Medicine Contact information: [place] [address] [phone]                 TOTAL DISCHARGE TIME: 4 minutes  [facility] Pager on [email or web address]  07/08/2021, 1:17 PM

## 2021-07-08 NOTE — Telephone Encounter (Signed)
Transition Care Management Follow-up Telephone Call   Date of discharge and from where:[facility] 07/07/2020 How have you been since you were released from the hospital? Feeling great Any questions or concerns? No questions/concerns reported.  Items Reviewed: Did the pt receive and understand the discharge instructions provided? have the instructions and have no questions.  Medications obtained and verified? He said that have the medication list  and the hospital staff reviewed them in detail prior to discharge. He said that he has all of the medications and has no questions.  Any new allergies since your discharge? None reported  Do you have support at home? Yes, spouse Other (ie: DME, Home Health, etc)     N/A   Functional Questionnaire: (I = Independent and D = Dependent) ADL's:  Independent.        Follow up appointments reviewed:   PCP Hospital f/u appt confirmed? Dr Wynetta Emery on 07/28/2021. Pt wanted to know if possible could be seen earlier.   [facility] f/u appt confirmed? None scheduled at this time  Are transportation arrangements needed? have transportation   If their condition worsens, is the pt aware to call  their PCP or go to the ED? Yes.Made pt aware if condition worsen or start experiencing rapid weight gain, chest pain, diff breathing, SOB, high fevers, or bleading to refer imediately to ED for further evaluation.  Was the patient provided with contact information for the PCP's office or ED? He has the phone number  Was the pt encouraged to call back with questions or concerns?yes

## 2021-07-09 ENCOUNTER — Other Ambulatory Visit: Payer: Self-pay

## 2021-07-15 ENCOUNTER — Other Ambulatory Visit: Payer: Self-pay

## 2021-07-28 ENCOUNTER — Ambulatory Visit: Payer: 59 | Attending: Internal Medicine | Admitting: Internal Medicine

## 2021-07-28 DIAGNOSIS — R69 Illness, unspecified: Secondary | ICD-10-CM | POA: Diagnosis not present

## 2021-07-28 DIAGNOSIS — R7989 Other specified abnormal findings of blood chemistry: Secondary | ICD-10-CM

## 2021-07-28 DIAGNOSIS — Z09 Encounter for follow-up examination after completed treatment for conditions other than malignant neoplasm: Secondary | ICD-10-CM

## 2021-07-28 DIAGNOSIS — D500 Iron deficiency anemia secondary to blood loss (chronic): Secondary | ICD-10-CM | POA: Diagnosis not present

## 2021-07-28 DIAGNOSIS — I10 Essential (primary) hypertension: Secondary | ICD-10-CM

## 2021-07-28 DIAGNOSIS — D696 Thrombocytopenia, unspecified: Secondary | ICD-10-CM | POA: Diagnosis not present

## 2021-07-28 DIAGNOSIS — K269 Duodenal ulcer, unspecified as acute or chronic, without hemorrhage or perforation: Secondary | ICD-10-CM | POA: Diagnosis not present

## 2021-07-28 DIAGNOSIS — F1020 Alcohol dependence, uncomplicated: Secondary | ICD-10-CM

## 2021-07-28 MED ORDER — PANTOPRAZOLE SODIUM 40 MG PO TBEC
40.0000 mg | DELAYED_RELEASE_TABLET | Freq: Every day | ORAL | 3 refills | Status: DC
  Filled 2021-07-28: qty 30, 30d supply, fill #0

## 2021-07-28 MED ORDER — FERROUS SULFATE 325 (65 FE) MG PO TABS
325.0000 mg | ORAL_TABLET | Freq: Two times a day (BID) | ORAL | 3 refills | Status: DC
  Filled 2021-07-28: qty 60, 30d supply, fill #0

## 2021-07-28 MED ORDER — AMLODIPINE BESYLATE 10 MG PO TABS
ORAL_TABLET | Freq: Every day | ORAL | 6 refills | Status: DC
Start: 2021-07-28 — End: 2022-12-23
  Filled 2021-07-28: qty 30, 30d supply, fill #0

## 2021-07-28 NOTE — Progress Notes (Signed)
Patient ID: Stephen Bowers, male   DOB: [birth year], 61 y.o.   MRN: [id number] Virtual Visit via Telephone Note  I connected with Stephen Bowers on 07/28/2021 at 11:13 a.m by telephone and verified that I am speaking with the correct person using two identifiers  Location: Patient: home Provider: office  Participants: Myself Patient    I discussed the limitations, risks, security and privacy concerns of performing an evaluation and management service by telephone and the availability of in person appointments. I also discussed with the patient that there may be a patient responsible charge related to this service. The patient expressed understanding and agreed to proceed.   History of Present Illness: HTN, vit D def, unspecified anemia , neuropathy in fingers, alcoholic pancreatitis, ETOH use disorder, HL, pre-DM, gout. Last evaluated by me 05/2020.  Purpose of today's visit is hospital follow-up.   Patient hospitalized 1/3-10/2021 with symptomatic anemia.  He was found to have a hemoglobin of 5.5 on admission and iron studies consistent with iron deficiency anemia.  He was transfused 2 units of PRBCs and was also given an iron infusion.  EGD showed duodenal ulcer without active bleeding with gastric erosions.  Pathology showed mild chronic gastritis; check for H. pylori was negative.  Patient advised to avoid NSAIDs and EtOH.  Hemoglobin at the time of discharge was 7.8.  He was discharged on iron supplement and PPI. Admitted to binge drinking on the weekends where he would have 6 beers and 5-6 shots of brandy.  He did not experience any withdrawal symptoms in the hospital.  He had mild elevation in AST and hepatic steatosis on ultrasound.  Hepatitis panel was negative.  He was also noted to have thrombocytopenia likely due to EtOH.  He had some elevated blood sugars.  A1c was 5.9 in the range for prediabetes.  Today: Pt reports prior to hosp he was taking Aleve at bedtime to help with  sleep.  He has not taken since hosp. No dizziness, tiredness. Did not get Protonix and iron supplement.  Reports he was told he would have to be seen by me first.  He also reports being out of BP meds for a while.  Actually he stopped taking them because he thought his BP was okay.  However BP was elevated in hosp In regards to his ETOH use he reports "I'm slowing it down slowly."  Currently drinks about 3 beers and 3-4 shots on wkend.    Outpatient Encounter Medications as of 07/28/2021  Medication Sig   acetaminophen (TYLENOL) 650 MG CR tablet Take 650 mg by mouth daily.   amLODipine (NORVASC) 10 MG tablet TAKE 1 TABLET (10 MG TOTAL) BY MOUTH DAILY. (Patient taking differently: Take 10 mg by mouth daily.)   Ascorbic Acid (VITAMIN C PO) Take 1 tablet by mouth daily.   carvedilol (COREG) 12.5 MG tablet TAKE 1 TABLET (12.5 MG TOTAL) BY MOUTH 2 (TWO) TIMES DAILY WITH A MEAL. (Patient taking differently: Take 12.5 mg by mouth 2 (two) times daily with a meal.)   cholecalciferol (VITAMIN D3) 25 MCG (1000 UNIT) tablet Take 1,000 Units by mouth daily.   colchicine 0.6 MG tablet TAKE 1 TABLET BY MOUTH EVERY OTHER DAY ON MONDAY, WEDNESDAY, AND FRIDAY. (Patient taking differently: Take 0.6 mg by mouth every other day.)   Cyanocobalamin 3000 MCG CAPS Take 3,000 mcg by mouth daily.   ELDERBERRY PO Take 1 tablet by mouth daily.   ferrous sulfate 325 (65 FE) MG tablet Take 1  tablet (325 mg total) by mouth 2 (two) times daily with a meal.   folic acid (FOLVITE) 1 MG tablet Take 1 tablet (1 mg total) by mouth daily.   Levocetirizine Dihydrochloride (XYZAL PO) Take 1 tablet by mouth daily as needed (allergies).   Multiple Vitamin (MULTIVITAMIN WITH MINERALS) TABS tablet Take 1 tablet by mouth daily.   Multiple Vitamins-Minerals (ZINC PO) Take 1 tablet by mouth daily.   pantoprazole (PROTONIX) 40 MG tablet Take 1 tablet (40 mg total) by mouth daily at 6 (six) AM.   Tetrahydrozoline HCl (VISINE OP) Place 1 drop  into both eyes daily as needed (dry eyes).    thiamine 100 MG tablet Take 1 tablet (100 mg total) by mouth daily.   vitamin E 1000 UNIT capsule Take 1,000 Units by mouth daily.   No facility-administered encounter medications on file as of 07/28/2021.      Observations/Objective: No direct observation done as this was a telephone visit.  Lab Results  Component Value Date   WBC 10.8 (H) 07/07/2021   HGB 7.8 (L) 07/07/2021   HCT 25.0 (L) 07/07/2021   MCV 81.4 07/07/2021   PLT 120 (L) 07/07/2021     Chemistry      Component Value Date/Time   NA 134 (L) 07/07/2021 0303   NA 139 04/22/2020 1508   K 3.4 (L) 07/07/2021 0303   CL 102 07/07/2021 0303   CO2 24 07/07/2021 0303   BUN 8 07/07/2021 0303   BUN 8 04/22/2020 1508   CREATININE 0.74 07/07/2021 0303   CREATININE 0.95 06/12/2016 1733      Component Value Date/Time   CALCIUM 8.6 (L) 07/07/2021 0303   ALKPHOS 28 (L) 07/07/2021 0303   AST 96 (H) 07/07/2021 0303   ALT 30 07/07/2021 0303   BILITOT 0.6 07/07/2021 0303   BILITOT 0.3 04/22/2020 1508     Lab Results  Component Value Date   IRON 15 (L) 07/05/2021   TIBC 515 (H) 07/05/2021   FERRITIN 5 (L) 07/05/2021    Assessment and Plan: 1. Hospital discharge follow-up   2. Iron deficiency anemia due to chronic blood loss 3. Duodenal ulcer Advised to stop all over-the-counter NSAIDs.  Strongly encouraged him to discontinue heavy drinking/binge drinking Prescription sent to our pharmacy for Protonix and iron supplement.  4. Essential hypertension Prescription sent to the pharmacy for amlodipine.  I will hold off on the carvedilol since he was not taking either of them for a while.  We will have him follow-up with me in 1 month for blood pressure check.  5. Alcohol use disorder, moderate, dependence ([place]) Strongly advised to quit.  Discussed health risks associated with heavy alcohol use.  Commended him on cutting back but encouraged him to quit completely.  We went over  with him how much alcohol is too much in 1 setting for male.  He is interested in trying to get into a treatment program or AA meetings.  Message sent to our LCSW to touch base with him.  6. Abnormal LFTs 7. Thrombocytopenia ([place]) Both due to alcohol use.  Again encouraged him to quit.   Follow Up Instructions: 1 mth for BP check and repeat CBC   I discussed the assessment and treatment plan with the patient. The patient was provided an opportunity to ask questions and all were answered. The patient agreed with the plan and demonstrated an understanding of the instructions.   The patient was advised to call back or seek an in-person evaluation  if the symptoms worsen or if the condition fails to improve as anticipated.  I  Spent 12 minutes on this telephone encounter  This note has been created with Surveyor, quantity. Any transcriptional errors are unintentional.  Karle Plumber, MD

## 2021-07-29 ENCOUNTER — Other Ambulatory Visit: Payer: Self-pay

## 2021-08-04 ENCOUNTER — Other Ambulatory Visit: Payer: Self-pay

## 2021-08-30 ENCOUNTER — Ambulatory Visit: Payer: 59 | Admitting: Internal Medicine

## 2022-05-21 ENCOUNTER — Encounter (HOSPITAL_COMMUNITY): Payer: Self-pay

## 2022-05-21 ENCOUNTER — Inpatient Hospital Stay (HOSPITAL_COMMUNITY)
Admission: EM | Admit: 2022-05-21 | Discharge: 2022-06-01 | DRG: 871 | Disposition: A | Discharge status: Home / Self Care | Payer: Commercial Managed Care - HMO | Attending: Internal Medicine | Admitting: Internal Medicine

## 2022-05-21 ENCOUNTER — Other Ambulatory Visit: Payer: Self-pay

## 2022-05-21 ENCOUNTER — Emergency Department (HOSPITAL_COMMUNITY): Payer: Commercial Managed Care - HMO

## 2022-05-21 DIAGNOSIS — L989 Disorder of the skin and subcutaneous tissue, unspecified: Secondary | ICD-10-CM | POA: Diagnosis present

## 2022-05-21 DIAGNOSIS — Z87891 Personal history of nicotine dependence: Secondary | ICD-10-CM

## 2022-05-21 DIAGNOSIS — Z8711 Personal history of peptic ulcer disease: Secondary | ICD-10-CM

## 2022-05-21 DIAGNOSIS — I129 Hypertensive chronic kidney disease with stage 1 through stage 4 chronic kidney disease, or unspecified chronic kidney disease: Secondary | ICD-10-CM | POA: Diagnosis present

## 2022-05-21 DIAGNOSIS — R6520 Severe sepsis without septic shock: Secondary | ICD-10-CM | POA: Diagnosis present

## 2022-05-21 DIAGNOSIS — Z8739 Personal history of other diseases of the musculoskeletal system and connective tissue: Secondary | ICD-10-CM

## 2022-05-21 DIAGNOSIS — D638 Anemia in other chronic diseases classified elsewhere: Secondary | ICD-10-CM | POA: Diagnosis present

## 2022-05-21 DIAGNOSIS — J690 Pneumonitis due to inhalation of food and vomit: Secondary | ICD-10-CM | POA: Diagnosis present

## 2022-05-21 DIAGNOSIS — D6959 Other secondary thrombocytopenia: Secondary | ICD-10-CM | POA: Diagnosis present

## 2022-05-21 DIAGNOSIS — F1010 Alcohol abuse, uncomplicated: Secondary | ICD-10-CM | POA: Diagnosis present

## 2022-05-21 DIAGNOSIS — D696 Thrombocytopenia, unspecified: Secondary | ICD-10-CM | POA: Diagnosis present

## 2022-05-21 DIAGNOSIS — E871 Hypo-osmolality and hyponatremia: Secondary | ICD-10-CM | POA: Diagnosis not present

## 2022-05-21 DIAGNOSIS — Z79899 Other long term (current) drug therapy: Secondary | ICD-10-CM

## 2022-05-21 DIAGNOSIS — Z91199 Patient's noncompliance with other medical treatment and regimen due to unspecified reason: Secondary | ICD-10-CM

## 2022-05-21 DIAGNOSIS — Z781 Physical restraint status: Secondary | ICD-10-CM

## 2022-05-21 DIAGNOSIS — I10 Essential (primary) hypertension: Secondary | ICD-10-CM | POA: Diagnosis present

## 2022-05-21 DIAGNOSIS — R569 Unspecified convulsions: Secondary | ICD-10-CM

## 2022-05-21 DIAGNOSIS — M109 Gout, unspecified: Secondary | ICD-10-CM | POA: Insufficient documentation

## 2022-05-21 DIAGNOSIS — K920 Hematemesis: Secondary | ICD-10-CM

## 2022-05-21 DIAGNOSIS — G8929 Other chronic pain: Secondary | ICD-10-CM | POA: Diagnosis present

## 2022-05-21 DIAGNOSIS — K766 Portal hypertension: Secondary | ICD-10-CM | POA: Diagnosis present

## 2022-05-21 DIAGNOSIS — K269 Duodenal ulcer, unspecified as acute or chronic, without hemorrhage or perforation: Secondary | ICD-10-CM | POA: Diagnosis present

## 2022-05-21 DIAGNOSIS — K7040 Alcoholic hepatic failure without coma: Secondary | ICD-10-CM | POA: Diagnosis present

## 2022-05-21 DIAGNOSIS — E785 Hyperlipidemia, unspecified: Secondary | ICD-10-CM | POA: Diagnosis present

## 2022-05-21 DIAGNOSIS — E872 Acidosis, unspecified: Secondary | ICD-10-CM | POA: Diagnosis present

## 2022-05-21 DIAGNOSIS — A419 Sepsis, unspecified organism: Secondary | ICD-10-CM | POA: Diagnosis not present

## 2022-05-21 DIAGNOSIS — J1282 Pneumonia due to coronavirus disease 2019: Secondary | ICD-10-CM | POA: Diagnosis present

## 2022-05-21 DIAGNOSIS — Z8719 Personal history of other diseases of the digestive system: Secondary | ICD-10-CM

## 2022-05-21 DIAGNOSIS — K7030 Alcoholic cirrhosis of liver without ascites: Secondary | ICD-10-CM

## 2022-05-21 DIAGNOSIS — K7210 Chronic hepatic failure without coma: Secondary | ICD-10-CM | POA: Insufficient documentation

## 2022-05-21 DIAGNOSIS — E876 Hypokalemia: Secondary | ICD-10-CM | POA: Diagnosis not present

## 2022-05-21 DIAGNOSIS — D684 Acquired coagulation factor deficiency: Secondary | ICD-10-CM | POA: Diagnosis present

## 2022-05-21 DIAGNOSIS — E7800 Pure hypercholesterolemia, unspecified: Secondary | ICD-10-CM | POA: Diagnosis present

## 2022-05-21 DIAGNOSIS — Z811 Family history of alcohol abuse and dependence: Secondary | ICD-10-CM

## 2022-05-21 DIAGNOSIS — N189 Chronic kidney disease, unspecified: Secondary | ICD-10-CM | POA: Diagnosis present

## 2022-05-21 DIAGNOSIS — F10139 Alcohol abuse with withdrawal, unspecified: Secondary | ICD-10-CM | POA: Diagnosis not present

## 2022-05-21 DIAGNOSIS — Z841 Family history of disorders of kidney and ureter: Secondary | ICD-10-CM

## 2022-05-21 DIAGNOSIS — Z833 Family history of diabetes mellitus: Secondary | ICD-10-CM

## 2022-05-21 DIAGNOSIS — G9341 Metabolic encephalopathy: Secondary | ICD-10-CM | POA: Diagnosis present

## 2022-05-21 DIAGNOSIS — B9689 Other specified bacterial agents as the cause of diseases classified elsewhere: Secondary | ICD-10-CM | POA: Diagnosis present

## 2022-05-21 DIAGNOSIS — K267 Chronic duodenal ulcer without hemorrhage or perforation: Secondary | ICD-10-CM | POA: Diagnosis present

## 2022-05-21 DIAGNOSIS — K2921 Alcoholic gastritis with bleeding: Secondary | ICD-10-CM | POA: Diagnosis present

## 2022-05-21 DIAGNOSIS — D649 Anemia, unspecified: Secondary | ICD-10-CM | POA: Diagnosis present

## 2022-05-21 DIAGNOSIS — U071 COVID-19: Secondary | ICD-10-CM | POA: Diagnosis present

## 2022-05-21 DIAGNOSIS — J9601 Acute respiratory failure with hypoxia: Secondary | ICD-10-CM | POA: Diagnosis not present

## 2022-05-21 MED ORDER — PANTOPRAZOLE INFUSION (NEW) - SIMPLE MED
8.0000 mg/h | INTRAVENOUS | Status: DC
  Administered 2022-05-22: 8 mg/h via INTRAVENOUS
  Filled 2022-05-21 (×2): qty 100

## 2022-05-21 MED ORDER — PANTOPRAZOLE SODIUM 40 MG IV SOLR: 40.0000 mg | Freq: Two times a day (BID) | INTRAVENOUS | Status: DC

## 2022-05-21 MED ORDER — PANTOPRAZOLE 80MG IVPB - SIMPLE MED
80.0000 mg | Freq: Once | INTRAVENOUS | Status: AC
  Administered 2022-05-22: 80 mg via INTRAVENOUS
  Filled 2022-05-21: qty 100

## 2022-05-21 MED ORDER — SODIUM CHLORIDE 0.9 % IV BOLUS
1000.0000 mL | Freq: Once | INTRAVENOUS | Status: AC
  Administered 2022-05-22: 1000 mL via INTRAVENOUS

## 2022-05-21 MED ORDER — ONDANSETRON HCL 4 MG/2ML IJ SOLN
4.0000 mg | Freq: Once | INTRAMUSCULAR | Status: AC
  Administered 2022-05-22: 4 mg via INTRAVENOUS
  Filled 2022-05-21: qty 2

## 2022-05-21 MED ORDER — ACETAMINOPHEN 500 MG PO TABS
1000.0000 mg | ORAL_TABLET | Freq: Once | ORAL | Status: AC
  Administered 2022-05-22: 1000 mg via ORAL
  Filled 2022-05-21: qty 2

## 2022-05-21 NOTE — ED Provider Notes (Signed)
[facility] EMERGENCY DEPARTMENT Provider Note   CSN: [id number] Arrival date & time: 05/21/22  2338     History {Add pertinent medical, surgical, social history, OB history to HPI:1} Chief Complaint  Patient presents with   Altered Mental Status    VEER ELAMIN is a 61 y.o. male.  The history is provided by the patient and medical records.  Altered Mental Status Associated symptoms: nausea and vomiting     61 y.o. M with hx of HTN, HLP, vit D deficiency, alcohol abuse, duodenal ulcers, anemia, fatty liver, presenting to the ED for AMS.  EMS called out due to AMS and vomiting blood.  Does have EtOH on board (daily drinker) but unclear how much he drank today.  Noted to be febrile to 103F on arrival.  Patient denies abdominal pain.  Home Medications Prior to Admission medications   Medication Sig Start Date End Date Taking? Authorizing Provider  acetaminophen (TYLENOL) 650 MG CR tablet Take 650 mg by mouth daily.    [provider]  amLODipine (NORVASC) 10 MG tablet TAKE 1 TABLET (10 MG TOTAL) BY MOUTH DAILY. 07/28/21 07/28/22  Ladell Pier, MD  Ascorbic Acid (VITAMIN C PO) Take 1 tablet by mouth daily.    [provider]  cholecalciferol (VITAMIN D3) 25 MCG (1000 UNIT) tablet Take 1,000 Units by mouth daily.    [provider]  colchicine 0.6 MG tablet TAKE 1 TABLET BY MOUTH EVERY OTHER DAY ON MONDAY, [place], AND FRIDAY. Patient taking differently: Take 0.6 mg by mouth every other day. 05/07/20 07/05/21  Ladell Pier, MD  Cyanocobalamin 3000 MCG CAPS Take 3,000 mcg by mouth daily.    [provider]  ELDERBERRY PO Take 1 tablet by mouth daily.    [provider]  ferrous sulfate 325 (65 FE) MG tablet Take 1 tablet (325 mg total) by mouth 2 (two) times daily with a meal. 07/28/21 11/25/21  Ladell Pier, MD  folic acid (FOLVITE) 1 MG tablet Take 1 tablet (1 mg total) by mouth daily. 07/08/21   Bonnielee Haff, MD  Levocetirizine Dihydrochloride (XYZAL PO) Take 1 tablet by mouth daily as needed (allergies).    [provider]  Multiple Vitamin (MULTIVITAMIN WITH MINERALS) TABS tablet Take 1 tablet by mouth daily.    [provider]  Multiple Vitamins-Minerals (ZINC PO) Take 1 tablet by mouth daily.    [provider]  pantoprazole (PROTONIX) 40 MG tablet Take 1 tablet (40 mg total) by mouth daily at 6 (six) AM. 07/28/21   Ladell Pier, MD  Tetrahydrozoline HCl (VISINE OP) Place 1 drop into both eyes daily as needed (dry eyes).     [provider]  thiamine 100 MG tablet Take 1 tablet (100 mg total) by mouth daily. 07/08/21   Bonnielee Haff, MD  vitamin E 1000 UNIT capsule Take 1,000 Units by mouth daily.    [provider]      Allergies    Patient has no known allergies.    Review of Systems   Review of Systems  Gastrointestinal:  Positive for nausea and vomiting.  All other systems reviewed and are negative.   Physical Exam Updated Vital Signs Ht '5\' 11"'$  (1.803 m)   Wt 89.8 kg   BMI 27.62 kg/m   Physical Exam Vitals and nursing note reviewed.  Constitutional:      Appearance: He is well-developed.  HENT:     Head: Normocephalic and atraumatic.  Mouth/Throat:     Comments: Dried blood present around the mouth Eyes:     Conjunctiva/sclera: Conjunctivae normal.     Pupils: Pupils are equal, round, and reactive to light.  Cardiovascular:     Rate and Rhythm: Normal rate and regular rhythm.     Heart sounds: Normal heart sounds.  Pulmonary:     Effort: Pulmonary effort is normal.     Breath sounds: Normal breath sounds.  Abdominal:     General: Bowel sounds are normal.     Palpations: Abdomen is soft.     Comments: Seems mildly distended but not rigid, no elicited tenderness  Musculoskeletal:        General: Normal range of motion.     Cervical back: Normal range of motion.  Skin:    General: Skin is warm and dry.   Neurological:     Mental Status: He is alert and oriented to person, place, and time.     ED Results / Procedures / Treatments   Labs (all labs ordered are listed, but only abnormal results are displayed) Labs Reviewed  CULTURE, BLOOD (ROUTINE X 2)  CULTURE, BLOOD (ROUTINE X 2)  URINE CULTURE  COMPREHENSIVE METABOLIC PANEL  LIPASE, BLOOD  ETHANOL  PROTIME-INR  APTT  LACTIC ACID, PLASMA  LACTIC ACID, PLASMA  URINALYSIS, ROUTINE W REFLEX MICROSCOPIC  CBC WITH DIFFERENTIAL/PLATELET  TYPE AND SCREEN    EKG None  Radiology No results found.  Procedures Procedures  {Document cardiac monitor, telemetry assessment procedure when appropriate:1}  Medications Ordered in ED Medications  sodium chloride 0.9 % bolus 1,000 mL (has no administration in time range)  pantoprazole (PROTONIX) 80 mg /NS 100 mL IVPB (has no administration in time range)  pantoprozole (PROTONIX) 80 mg /NS 100 mL infusion (has no administration in time range)  pantoprazole (PROTONIX) injection 40 mg (has no administration in time range)  ondansetron (ZOFRAN) injection 4 mg (has no administration in time range)  acetaminophen (TYLENOL) tablet 1,000 mg (has no administration in time range)    ED Course/ Medical Decision Making/ A&P                           Medical Decision Making Amount and/or Complexity of Data Reviewed Labs: ordered. Radiology: ordered.  Risk OTC drugs. Prescription drug management.   ***  {Document critical care time when appropriate:1} {Document review of labs and clinical decision tools ie heart score, Chads2Vasc2 etc:1}  {Document your independent review of radiology images, and any outside records:1} {Document your discussion with family members, caretakers, and with consultants:1} {Document social determinants of health affecting pt's care:1} {Document your decision making why or why not admission, treatments were needed:1} Final Clinical Impression(s) / ED  Diagnoses Final diagnoses:  None    Rx / DC Orders ED Discharge Orders     None

## 2022-05-21 NOTE — ED Triage Notes (Signed)
Pt has hx of ETOH but Family called EMS d/t altered mental status and vomiting blood.  Pt has fever of 103.3 upon arrival.

## 2022-05-22 ENCOUNTER — Emergency Department (HOSPITAL_COMMUNITY): Payer: Commercial Managed Care - HMO

## 2022-05-22 ENCOUNTER — Encounter (HOSPITAL_COMMUNITY): Payer: Self-pay | Admitting: Family Medicine

## 2022-05-22 ENCOUNTER — Inpatient Hospital Stay (HOSPITAL_COMMUNITY): Payer: Commercial Managed Care - HMO

## 2022-05-22 DIAGNOSIS — J1282 Pneumonia due to coronavirus disease 2019: Secondary | ICD-10-CM | POA: Diagnosis present

## 2022-05-22 DIAGNOSIS — U071 COVID-19: Secondary | ICD-10-CM

## 2022-05-22 DIAGNOSIS — G9341 Metabolic encephalopathy: Secondary | ICD-10-CM | POA: Diagnosis present

## 2022-05-22 DIAGNOSIS — J690 Pneumonitis due to inhalation of food and vomit: Secondary | ICD-10-CM

## 2022-05-22 DIAGNOSIS — R6520 Severe sepsis without septic shock: Secondary | ICD-10-CM | POA: Diagnosis present

## 2022-05-22 DIAGNOSIS — D6959 Other secondary thrombocytopenia: Secondary | ICD-10-CM | POA: Diagnosis present

## 2022-05-22 DIAGNOSIS — M109 Gout, unspecified: Secondary | ICD-10-CM | POA: Diagnosis present

## 2022-05-22 DIAGNOSIS — K7030 Alcoholic cirrhosis of liver without ascites: Secondary | ICD-10-CM

## 2022-05-22 DIAGNOSIS — E872 Acidosis, unspecified: Secondary | ICD-10-CM

## 2022-05-22 DIAGNOSIS — K920 Hematemesis: Secondary | ICD-10-CM

## 2022-05-22 DIAGNOSIS — K2921 Alcoholic gastritis with bleeding: Secondary | ICD-10-CM | POA: Diagnosis present

## 2022-05-22 DIAGNOSIS — K766 Portal hypertension: Secondary | ICD-10-CM | POA: Diagnosis present

## 2022-05-22 DIAGNOSIS — K7210 Chronic hepatic failure without coma: Secondary | ICD-10-CM

## 2022-05-22 DIAGNOSIS — D684 Acquired coagulation factor deficiency: Secondary | ICD-10-CM | POA: Diagnosis present

## 2022-05-22 DIAGNOSIS — F1010 Alcohol abuse, uncomplicated: Secondary | ICD-10-CM

## 2022-05-22 DIAGNOSIS — F10139 Alcohol abuse with withdrawal, unspecified: Secondary | ICD-10-CM | POA: Diagnosis not present

## 2022-05-22 DIAGNOSIS — E871 Hypo-osmolality and hyponatremia: Secondary | ICD-10-CM | POA: Diagnosis not present

## 2022-05-22 DIAGNOSIS — I129 Hypertensive chronic kidney disease with stage 1 through stage 4 chronic kidney disease, or unspecified chronic kidney disease: Secondary | ICD-10-CM | POA: Diagnosis present

## 2022-05-22 DIAGNOSIS — E7800 Pure hypercholesterolemia, unspecified: Secondary | ICD-10-CM | POA: Diagnosis present

## 2022-05-22 DIAGNOSIS — K7040 Alcoholic hepatic failure without coma: Secondary | ICD-10-CM | POA: Diagnosis present

## 2022-05-22 DIAGNOSIS — N189 Chronic kidney disease, unspecified: Secondary | ICD-10-CM | POA: Diagnosis present

## 2022-05-22 DIAGNOSIS — A419 Sepsis, unspecified organism: Secondary | ICD-10-CM

## 2022-05-22 DIAGNOSIS — K267 Chronic duodenal ulcer without hemorrhage or perforation: Secondary | ICD-10-CM | POA: Diagnosis present

## 2022-05-22 DIAGNOSIS — D638 Anemia in other chronic diseases classified elsewhere: Secondary | ICD-10-CM | POA: Diagnosis present

## 2022-05-22 DIAGNOSIS — J9601 Acute respiratory failure with hypoxia: Secondary | ICD-10-CM | POA: Diagnosis not present

## 2022-05-22 DIAGNOSIS — R112 Nausea with vomiting, unspecified: Secondary | ICD-10-CM

## 2022-05-22 DIAGNOSIS — E876 Hypokalemia: Secondary | ICD-10-CM | POA: Diagnosis not present

## 2022-05-22 DIAGNOSIS — D696 Thrombocytopenia, unspecified: Secondary | ICD-10-CM

## 2022-05-22 LAB — HEMOGLOBIN AND HEMATOCRIT, BLOOD
HCT: 23.1 % — ABNORMAL LOW (ref 39.0–52.0)
Hemoglobin: 7.5 g/dL — ABNORMAL LOW (ref 13.0–17.0)

## 2022-05-22 LAB — CBC WITH DIFFERENTIAL/PLATELET
Abs Immature Granulocytes: 0.03 10*3/uL (ref 0.00–0.07)
Abs Immature Granulocytes: 0.05 10*3/uL (ref 0.00–0.07)
Basophils Absolute: 0.1 10*3/uL (ref 0.0–0.1)
Basophils Absolute: 0.1 10*3/uL (ref 0.0–0.1)
Basophils Relative: 0 %
Basophils Relative: 1 %
Eosinophils Absolute: 0 10*3/uL (ref 0.0–0.5)
Eosinophils Absolute: 0 10*3/uL (ref 0.0–0.5)
Eosinophils Relative: 0 %
Eosinophils Relative: 0 %
HCT: 24.7 % — ABNORMAL LOW (ref 39.0–52.0)
HCT: 28.3 % — ABNORMAL LOW (ref 39.0–52.0)
Hemoglobin: 8.2 g/dL — ABNORMAL LOW (ref 13.0–17.0)
Hemoglobin: 9 g/dL — ABNORMAL LOW (ref 13.0–17.0)
Immature Granulocytes: 0 %
Immature Granulocytes: 0 %
Lymphocytes Relative: 4 %
Lymphocytes Relative: 5 %
Lymphs Abs: 0.4 10*3/uL — ABNORMAL LOW (ref 0.7–4.0)
Lymphs Abs: 0.6 10*3/uL — ABNORMAL LOW (ref 0.7–4.0)
MCH: 27.6 pg (ref 26.0–34.0)
MCH: 28.2 pg (ref 26.0–34.0)
MCHC: 31.8 g/dL (ref 30.0–36.0)
MCHC: 33.2 g/dL (ref 30.0–36.0)
MCV: 84.9 fL (ref 80.0–100.0)
MCV: 86.8 fL (ref 80.0–100.0)
Monocytes Absolute: 0.7 10*3/uL (ref 0.1–1.0)
Monocytes Absolute: 0.9 10*3/uL (ref 0.1–1.0)
Monocytes Relative: 7 %
Monocytes Relative: 8 %
Neutro Abs: 8.1 10*3/uL — ABNORMAL HIGH (ref 1.7–7.7)
Neutro Abs: 9.8 10*3/uL — ABNORMAL HIGH (ref 1.7–7.7)
Neutrophils Relative %: 87 %
Neutrophils Relative %: 88 %
Platelets: 39 10*3/uL — ABNORMAL LOW (ref 150–400)
Platelets: 42 10*3/uL — ABNORMAL LOW (ref 150–400)
RBC: 2.91 MIL/uL — ABNORMAL LOW (ref 4.22–5.81)
RBC: 3.26 MIL/uL — ABNORMAL LOW (ref 4.22–5.81)
RDW: 16 % — ABNORMAL HIGH (ref 11.5–15.5)
RDW: 16.1 % — ABNORMAL HIGH (ref 11.5–15.5)
WBC: 11.3 10*3/uL — ABNORMAL HIGH (ref 4.0–10.5)
WBC: 9.3 10*3/uL (ref 4.0–10.5)
nRBC: 0 % (ref 0.0–0.2)
nRBC: 0 % (ref 0.0–0.2)

## 2022-05-22 LAB — COMPREHENSIVE METABOLIC PANEL
ALT: 44 U/L (ref 0–44)
ALT: 41 U/L (ref 0–44)
AST: 195 U/L — ABNORMAL HIGH (ref 15–41)
AST: 191 U/L — ABNORMAL HIGH (ref 15–41)
Albumin: 3.7 g/dL (ref 3.5–5.0)
Albumin: 3.2 g/dL — ABNORMAL LOW (ref 3.5–5.0)
Alkaline Phosphatase: 32 U/L — ABNORMAL LOW (ref 38–126)
Alkaline Phosphatase: 27 U/L — ABNORMAL LOW (ref 38–126)
Anion gap: 18 — ABNORMAL HIGH (ref 5–15)
Anion gap: 15 (ref 5–15)
BUN: 7 mg/dL (ref 6–20)
BUN: 7 mg/dL (ref 6–20)
CO2: 15 mmol/L — ABNORMAL LOW (ref 22–32)
CO2: 17 mmol/L — ABNORMAL LOW (ref 22–32)
Calcium: 8.2 mg/dL — ABNORMAL LOW (ref 8.9–10.3)
Calcium: 7.6 mg/dL — ABNORMAL LOW (ref 8.9–10.3)
Chloride: 104 mmol/L (ref 98–111)
Chloride: 107 mmol/L (ref 98–111)
Creatinine, Ser: 0.92 mg/dL (ref 0.61–1.24)
Creatinine, Ser: 0.88 mg/dL (ref 0.61–1.24)
GFR, Estimated: >60 mL/min (ref >60)
GFR, Estimated: >60 mL/min (ref >60)
Glucose, Bld: 142 mg/dL — ABNORMAL HIGH (ref 70–99)
Glucose, Bld: 137 mg/dL — ABNORMAL HIGH (ref 70–99)
Potassium: 3.7 mmol/L (ref 3.5–5.1)
Potassium: 3.4 mmol/L — ABNORMAL LOW (ref 3.5–5.1)
Sodium: 137 mmol/L (ref 135–145)
Sodium: 139 mmol/L (ref 135–145)
Total Bilirubin: 0.3 mg/dL (ref 0.3–1.2)
Total Bilirubin: 0.5 mg/dL (ref 0.3–1.2)
Total Protein: 8.2 g/dL — ABNORMAL HIGH (ref 6.5–8.1)
Total Protein: 7.4 g/dL (ref 6.5–8.1)

## 2022-05-22 LAB — D-DIMER, QUANTITATIVE: D-Dimer, Quant: 2.6 ug/mL-FEU — ABNORMAL HIGH (ref 0.00–0.50)

## 2022-05-22 LAB — PROTIME-INR
INR: 1.3 — ABNORMAL HIGH (ref 0.8–1.2)
INR: 1.3 — ABNORMAL HIGH (ref 0.8–1.2)
Prothrombin Time: 15.5 seconds — ABNORMAL HIGH (ref 11.4–15.2)
Prothrombin Time: 16.3 seconds — ABNORMAL HIGH (ref 11.4–15.2)

## 2022-05-22 LAB — URINALYSIS, ROUTINE W REFLEX MICROSCOPIC
Bacteria, UA: NONE SEEN
Bilirubin Urine: NEGATIVE
Glucose, UA: NEGATIVE mg/dL
Ketones, ur: NEGATIVE mg/dL
Nitrite: NEGATIVE
Protein, ur: NEGATIVE mg/dL
Specific Gravity, Urine: 1.038 — ABNORMAL HIGH (ref 1.005–1.030)
pH: 5 (ref 5.0–8.0)

## 2022-05-22 LAB — APTT: aPTT: 29 seconds (ref 24–36)

## 2022-05-22 LAB — LACTIC ACID, PLASMA
Lactic Acid, Venous: 4.9 mmol/L (ref 0.5–1.9)
Lactic Acid, Venous: 2.6 mmol/L (ref 0.5–1.9)
Lactic Acid, Venous: 4.7 mmol/L (ref 0.5–1.9)
Lactic Acid, Venous: 3.4 mmol/L (ref 0.5–1.9)

## 2022-05-22 LAB — TYPE AND SCREEN
ABO/RH(D): A POS
Antibody Screen: NEGATIVE

## 2022-05-22 LAB — RESP PANEL BY RT-PCR (FLU A&B, COVID) ARPGX2
Influenza A by PCR: NEGATIVE
Influenza B by PCR: NEGATIVE
SARS Coronavirus 2 by RT PCR: POSITIVE — AB

## 2022-05-22 LAB — LACTATE DEHYDROGENASE: LDH: 222 U/L — ABNORMAL HIGH (ref 98–192)

## 2022-05-22 LAB — FERRITIN: Ferritin: 16 ng/mL — ABNORMAL LOW (ref 24–336)

## 2022-05-22 LAB — FIBRINOGEN: Fibrinogen: 356 mg/dL (ref 210–475)

## 2022-05-22 LAB — AMMONIA: Ammonia: 28 umol/L (ref 9–35)

## 2022-05-22 LAB — PHOSPHORUS: Phosphorus: 3.2 mg/dL (ref 2.5–4.6)

## 2022-05-22 LAB — MAGNESIUM: Magnesium: 1.2 mg/dL — ABNORMAL LOW (ref 1.7–2.4)

## 2022-05-22 LAB — PROCALCITONIN: Procalcitonin: 11.59 ng/mL

## 2022-05-22 LAB — STREP PNEUMONIAE URINARY ANTIGEN: Strep Pneumo Urinary Antigen: NEGATIVE

## 2022-05-22 LAB — TRIGLYCERIDES: Triglycerides: 82 mg/dL (ref <150)

## 2022-05-22 LAB — MRSA NEXT GEN BY PCR, NASAL: MRSA by PCR Next Gen: NOT DETECTED

## 2022-05-22 LAB — ETHANOL: Alcohol, Ethyl (B): 216 mg/dL — ABNORMAL HIGH (ref <10)

## 2022-05-22 LAB — LIPASE, BLOOD: Lipase: 27 U/L (ref 11–51)

## 2022-05-22 MED ORDER — ONDANSETRON HCL 4 MG/2ML IJ SOLN: 4.0000 mg | Freq: Four times a day (QID) | INTRAMUSCULAR | Status: DC | PRN

## 2022-05-22 MED ORDER — POTASSIUM CHLORIDE CRYS ER 20 MEQ PO TBCR
40.0000 meq | EXTENDED_RELEASE_TABLET | Freq: Once | ORAL | Status: AC
  Administered 2022-05-22: 40 meq via ORAL
  Filled 2022-05-22: qty 2

## 2022-05-22 MED ORDER — LORAZEPAM 2 MG/ML IJ SOLN
1.0000 mg | INTRAMUSCULAR | Status: AC | PRN
  Administered 2022-05-24: 2 mg via INTRAVENOUS
  Administered 2022-05-24: 3 mg via INTRAVENOUS
  Administered 2022-05-24: 2 mg via INTRAVENOUS
  Filled 2022-05-22: qty 1
  Filled 2022-05-22: qty 2
  Filled 2022-05-22: qty 1
  Filled 2022-05-22: qty 2

## 2022-05-22 MED ORDER — SODIUM CHLORIDE 0.9 % IV SOLN
2.0000 g | Freq: Once | INTRAVENOUS | Status: AC
  Administered 2022-05-22: 2 g via INTRAVENOUS
  Filled 2022-05-22: qty 20

## 2022-05-22 MED ORDER — VANCOMYCIN HCL 1500 MG/300ML IV SOLN
1500.0000 mg | Freq: Once | INTRAVENOUS | Status: AC
  Administered 2022-05-22: 1500 mg via INTRAVENOUS
  Filled 2022-05-22: qty 300

## 2022-05-22 MED ORDER — IOHEXOL 350 MG/ML SOLN
75.0000 mL | Freq: Once | INTRAVENOUS | Status: AC | PRN
  Administered 2022-05-22: 75 mL via INTRAVENOUS

## 2022-05-22 MED ORDER — AMLODIPINE BESYLATE 10 MG PO TABS
10.0000 mg | ORAL_TABLET | Freq: Every day | ORAL | Status: DC
  Administered 2022-05-22 – 2022-06-01 (×11): 10 mg via ORAL
  Filled 2022-05-22: qty 2
  Filled 2022-05-22 (×10): qty 1

## 2022-05-22 MED ORDER — SODIUM CHLORIDE 0.9 % IV SOLN
100.0000 mg | Freq: Every day | INTRAVENOUS | Status: AC
  Administered 2022-05-23 – 2022-05-24 (×2): 100 mg via INTRAVENOUS
  Filled 2022-05-22 (×2): qty 20

## 2022-05-22 MED ORDER — ACETAMINOPHEN 500 MG PO TABS
1000.0000 mg | ORAL_TABLET | Freq: Once | ORAL | Status: AC
  Administered 2022-05-22: 1000 mg via ORAL

## 2022-05-22 MED ORDER — METRONIDAZOLE 500 MG/100ML IV SOLN
500.0000 mg | Freq: Two times a day (BID) | INTRAVENOUS | Status: DC
  Administered 2022-05-22: 500 mg via INTRAVENOUS
  Filled 2022-05-22: qty 100

## 2022-05-22 MED ORDER — SODIUM CHLORIDE 0.9 % IV SOLN: 2.0000 g | Freq: Three times a day (TID) | INTRAVENOUS | Status: DC

## 2022-05-22 MED ORDER — FOLIC ACID 1 MG PO TABS
1.0000 mg | ORAL_TABLET | Freq: Every day | ORAL | Status: DC
  Administered 2022-05-22 – 2022-05-29 (×8): 1 mg via ORAL
  Filled 2022-05-22 (×8): qty 1

## 2022-05-22 MED ORDER — SODIUM CHLORIDE 0.9 % IV BOLUS
1500.0000 mL | Freq: Once | INTRAVENOUS | Status: AC
  Administered 2022-05-22: 1500 mL via INTRAVENOUS

## 2022-05-22 MED ORDER — LORAZEPAM 1 MG PO TABS
1.0000 mg | ORAL_TABLET | ORAL | Status: AC | PRN
  Administered 2022-05-23 – 2022-05-24 (×2): 1 mg via ORAL
  Filled 2022-05-22 (×2): qty 1

## 2022-05-22 MED ORDER — PIPERACILLIN-TAZOBACTAM 3.375 G IVPB
3.3750 g | Freq: Three times a day (TID) | INTRAVENOUS | Status: DC
  Administered 2022-05-22 – 2022-05-30 (×23): 3.375 g via INTRAVENOUS
  Filled 2022-05-22 (×23): qty 50

## 2022-05-22 MED ORDER — ACETAMINOPHEN 650 MG RE SUPP: 650.0000 mg | Freq: Four times a day (QID) | RECTAL | Status: DC | PRN

## 2022-05-22 MED ORDER — SODIUM CHLORIDE 0.9 % IV SOLN
INTRAVENOUS | Status: DC
  Administered 2022-05-22: 100 mL/h via INTRAVENOUS

## 2022-05-22 MED ORDER — SODIUM CHLORIDE 0.9 % IV SOLN
200.0000 mg | Freq: Once | INTRAVENOUS | Status: AC
  Administered 2022-05-22: 200 mg via INTRAVENOUS
  Filled 2022-05-22: qty 40

## 2022-05-22 MED ORDER — ALBUTEROL SULFATE (2.5 MG/3ML) 0.083% IN NEBU: 2.5000 mg | INHALATION_SOLUTION | RESPIRATORY_TRACT | Status: DC | PRN

## 2022-05-22 MED ORDER — ADULT MULTIVITAMIN W/MINERALS CH
1.0000 | ORAL_TABLET | Freq: Every day | ORAL | Status: DC
  Administered 2022-05-22 – 2022-05-29 (×7): 1 via ORAL
  Filled 2022-05-22 (×8): qty 1

## 2022-05-22 MED ORDER — THIAMINE HCL 100 MG/ML IJ SOLN
100.0000 mg | Freq: Every day | INTRAMUSCULAR | Status: DC
  Administered 2022-05-25: 100 mg via INTRAVENOUS
  Filled 2022-05-22 (×4): qty 2

## 2022-05-22 MED ORDER — THIAMINE MONONITRATE 100 MG PO TABS
100.0000 mg | ORAL_TABLET | Freq: Every day | ORAL | Status: DC
  Administered 2022-05-22 – 2022-05-31 (×9): 100 mg via ORAL
  Filled 2022-05-22 (×9): qty 1

## 2022-05-22 MED ORDER — MAGNESIUM SULFATE 4 GM/100ML IV SOLN
4.0000 g | Freq: Once | INTRAVENOUS | Status: AC
  Administered 2022-05-22: 4 g via INTRAVENOUS
  Filled 2022-05-22: qty 100

## 2022-05-22 MED ORDER — SODIUM CHLORIDE 0.9 % IV SOLN
500.0000 mg | INTRAVENOUS | Status: DC
  Administered 2022-05-22: 500 mg via INTRAVENOUS
  Filled 2022-05-22: qty 5

## 2022-05-22 MED ORDER — LACTATED RINGERS IV BOLUS: 1000.0000 mL | Freq: Once | INTRAVENOUS | Status: DC

## 2022-05-22 MED ORDER — ACETAMINOPHEN 325 MG PO TABS
650.0000 mg | ORAL_TABLET | Freq: Four times a day (QID) | ORAL | Status: DC | PRN
  Administered 2022-05-22 – 2022-05-30 (×11): 650 mg via ORAL
  Filled 2022-05-22 (×12): qty 2

## 2022-05-22 MED ORDER — ONDANSETRON HCL 4 MG PO TABS: 4.0000 mg | ORAL_TABLET | Freq: Four times a day (QID) | ORAL | Status: DC | PRN

## 2022-05-22 MED ORDER — VANCOMYCIN HCL 1250 MG/250ML IV SOLN: 1250.0000 mg | Freq: Two times a day (BID) | INTRAVENOUS | Status: DC

## 2022-05-22 MED ORDER — LACTATED RINGERS IV SOLN: INTRAVENOUS | Status: DC

## 2022-05-22 MED ORDER — PANTOPRAZOLE SODIUM 40 MG IV SOLR
40.0000 mg | Freq: Two times a day (BID) | INTRAVENOUS | Status: DC
  Administered 2022-05-22 – 2022-05-23 (×3): 40 mg via INTRAVENOUS
  Filled 2022-05-22 (×3): qty 10

## 2022-05-22 MED ORDER — ACETAMINOPHEN 500 MG PO TABS
ORAL_TABLET | ORAL | Status: AC
  Filled 2022-05-22: qty 2

## 2022-05-22 NOTE — Sepsis Progress Note (Signed)
Elink following for Sepsis Protocol 

## 2022-05-22 NOTE — H&P (Addendum)
PCP:   Ladell Pier, MD   Chief Complaint: Hematemesis   HPI: This is a 61 year old male with past medical history hypertension, CKD, dyslipidemia, alcohol use, peptic ulcer disease, thrombocytopenia and liver cirrhosis.  EMS called because the patient was confused, had nausea, vomiting and hematemesis.    On arrival to the ER he was febrile at 103 and COVID-positive.  Imaging showed pneumonia vs aspiration pneumonia.  Patient had lactic acidosis of 4.8, decreased to 2.6 after sepsis bundle IVF bolus.  Imaging also confirms liver cirrhosis.  Platelets 42 (patient has chronic thrombocytopenia).  In the ER patient received 1000 mg of Tylenol, Tmax increased to 104.2 oral.  Antibiotics given includes Rocephin, Flagyl and azithromycin.  Blood cultures x2 and urine culture collected.  80 mg IV Protonix also given.  Patient does not recall the events that brought him here.  Patient states he was simply dizzy.  He endorses drinking 3-4 shots of brandy and 3-4 bottles every weekend..  He states he does not get withdrawal symptoms.  Review of Systems:  The patient denies anorexia, fever, weight loss,, vision loss, decreased hearing, hoarseness, chest pain, syncope, dyspnea on exertion, peripheral edema, balance deficits, abdominal pain, melena, hematochezia, severe indigestion/heartburn, hematuria, incontinence, genital sores, muscle weakness, suspicious skin lesions, transient blindness, difficulty walking, depression, unusual weight change, abnormal bleeding, enlarged lymph nodes, angioedema, and breast masses. Positives: Hematemesis, nausea, vomiting, fever, altered mentation  Past Medical History: Past Medical History:  Diagnosis Date   Chronic musculoskeletal pain    CKD (chronic kidney disease)    follows with [facility] Hollie Salk)   High cholesterol Dx 2011   Hypertension    Pancolitis ([place])    Wears partial dentures    upper only   Past Surgical History:  Procedure Laterality Date    BIOPSY  07/06/2021   Procedure: BIOPSY;  Surgeon: Irene Shipper, MD;  Location: [facility] ENDOSCOPY;  Service: Endoscopy;;   COLONOSCOPY  12/2014   hx polyps - [place]   ESOPHAGOGASTRODUODENOSCOPY (EGD) WITH PROPOFOL N/A 07/06/2021   Procedure: ESOPHAGOGASTRODUODENOSCOPY (EGD) WITH PROPOFOL;  Surgeon: Irene Shipper, MD;  Location: [facility] ENDOSCOPY;  Service: Endoscopy;  Laterality: N/A;   MULTIPLE TOOTH EXTRACTIONS      Medications: Prior to Admission medications   Medication Sig Start Date End Date Taking? Authorizing Provider  acetaminophen (TYLENOL) 650 MG CR tablet Take 650 mg by mouth daily.    [provider]  amLODipine (NORVASC) 10 MG tablet TAKE 1 TABLET (10 MG TOTAL) BY MOUTH DAILY. 07/28/21 07/28/22  Ladell Pier, MD  Ascorbic Acid (VITAMIN C PO) Take 1 tablet by mouth daily.    [provider]  cholecalciferol (VITAMIN D3) 25 MCG (1000 UNIT) tablet Take 1,000 Units by mouth daily.    [provider]  colchicine 0.6 MG tablet TAKE 1 TABLET BY MOUTH EVERY OTHER DAY ON MONDAY, [place], AND FRIDAY. Patient taking differently: Take 0.6 mg by mouth every other day. 05/07/20 07/05/21  Ladell Pier, MD  Cyanocobalamin 3000 MCG CAPS Take 3,000 mcg by mouth daily.    [provider]  ELDERBERRY PO Take 1 tablet by mouth daily.    [provider]  ferrous sulfate 325 (65 FE) MG tablet Take 1 tablet (325 mg total) by mouth 2 (two) times daily with a meal. 07/28/21 11/25/21  Ladell Pier, MD  folic acid (FOLVITE) 1 MG tablet Take 1 tablet (1 mg total) by mouth daily. 07/08/21   Bonnielee Haff, MD  Levocetirizine  Dihydrochloride (XYZAL PO) Take 1 tablet by mouth daily as needed (allergies).    [provider]  Multiple Vitamin (MULTIVITAMIN WITH MINERALS) TABS tablet Take 1 tablet by mouth daily.    [provider]  Multiple Vitamins-Minerals (ZINC PO) Take 1 tablet by mouth daily.    [provider]  pantoprazole  (PROTONIX) 40 MG tablet Take 1 tablet (40 mg total) by mouth daily at 6 (six) AM. 07/28/21   Ladell Pier, MD  Tetrahydrozoline HCl (VISINE OP) Place 1 drop into both eyes daily as needed (dry eyes).     [provider]  thiamine 100 MG tablet Take 1 tablet (100 mg total) by mouth daily. 07/08/21   Bonnielee Haff, MD  vitamin E 1000 UNIT capsule Take 1,000 Units by mouth daily.    [provider]    Allergies:  No Known Allergies  Social History:  reports that he quit smoking about 28 years ago. His smoking use included cigarettes. He has a 1.50 pack-year smoking history. He has never used smokeless tobacco. He reports current alcohol use of about 6.0 standard drinks of alcohol per week. He reports that he does not use drugs.  Family History: Family History  Problem Relation Age of Onset   Liver disease Father    Alcohol abuse Father    Diabetes Sister    Obesity Sister    Cancer Mother        breast    Kidney disease Mother    Colon cancer Neg Hx    Esophageal cancer Neg Hx    Rectal cancer Neg Hx    Stomach cancer Neg Hx    Colon polyps Neg Hx     Physical Exam: Vitals:   05/22/22 0055 05/22/22 0100 05/22/22 0115 05/22/22 0332  BP:  132/81 126/74   Pulse:  (!) 110 (!) 108   Resp:  20 (!) 25   Temp: (!) 103.4 F (39.7 C) (!) 103 F (39.4 C)  (!) 102.2 F (39 C)  TempSrc: Oral Oral  Oral  SpO2:  95% 92%   Weight:      Height:        General: Awake, somewhat encephalopathic male.  Ill-appearing.  Febrile. Eyes: PERRLA, pink conjunctiva, no scleral icterus ENT: Dry oral mucosa, neck supple, no thyromegaly Lungs: clear to ascultation, no wheeze, no crackles, no use of accessory muscles Cardiovascular: Tachycardia, regular rate and rhythm, no regurgitation, no gallops, no murmurs. No carotid bruits, no JVD Abdomen: soft, positive BS, non-tender, non-distended, no organomegaly, not an acute abdomen GU: not examined Neuro: CN II - XII appears  grossly intact, sensation intact Musculoskeletal: strength 5/5 all extremities, no clubbing, cyanosis or edema Skin: no rash, no subcutaneous crepitation, no decubitus Psych: Encephalopathic patient, improving   Labs on Admission:  Recent Labs    05/21/22 2348  NA 137  K 3.7  CL 104  CO2 15*  GLUCOSE 142*  BUN 7  CREATININE 0.92  CALCIUM 8.2*   Recent Labs    05/21/22 2348  AST 195*  ALT 44  ALKPHOS 32*  BILITOT 0.3  PROT 8.2*  ALBUMIN 3.7   Recent Labs    05/21/22 2348  LIPASE 27   Recent Labs    05/21/22 2348  WBC 9.3  NEUTROABS 8.1*  HGB 9.0*  HCT 28.3*  MCV 86.8  PLT 42*    Micro Results: Recent Results (from the past 240 hour(s))  Resp Panel by RT-PCR (Flu A&B, Covid)  Status: Abnormal   Collection Time: 05/21/22 12:02 AM   Specimen: Nasal Swab  Result Value Ref Range Status   SARS Coronavirus 2 by RT PCR POSITIVE (A) NEGATIVE Final    Comment: (NOTE) SARS-CoV-2 target nucleic acids are DETECTED.  The SARS-CoV-2 RNA is generally detectable in upper respiratory specimens during the acute phase of infection. Positive results are indicative of the presence of the identified virus, but do not rule out bacterial infection or co-infection with other pathogens not detected by the test. Clinical correlation with patient history and other diagnostic information is necessary to determine patient infection status. The expected result is Negative.  Fact Sheet for Patients: EntrepreneurPulse.com.au  Fact Sheet for Healthcare Providers: IncredibleEmployment.be  This test is not yet approved or cleared by the [place] FDA and  has been authorized for detection and/or diagnosis of SARS-CoV-2 by FDA under an Emergency Use Authorization (EUA).  This EUA will remain in effect (meaning this test can be used) for the duration of  the COVID-19 declaration under Section 564(b)(1) of the A ct, 21 U.S.C. section  360bbb-3(b)(1), unless the authorization is terminated or revoked sooner.     Influenza A by PCR NEGATIVE NEGATIVE Final   Influenza B by PCR NEGATIVE NEGATIVE Final    Comment: (NOTE) The Xpert Xpress SARS-CoV-2/FLU/RSV plus assay is intended as an aid in the diagnosis of influenza from Nasopharyngeal swab specimens and should not be used as a sole basis for treatment. Nasal washings and aspirates are unacceptable for Xpert Xpress SARS-CoV-2/FLU/RSV testing.  Fact Sheet for Patients: EntrepreneurPulse.com.au  Fact Sheet for Healthcare Providers: IncredibleEmployment.be  This test is not yet approved or cleared by the [place] FDA and has been authorized for detection and/or diagnosis of SARS-CoV-2 by FDA under an Emergency Use Authorization (EUA). This EUA will remain in effect (meaning this test can be used) for the duration of the COVID-19 declaration under Section 564(b)(1) of the Act, 21 U.S.C. section 360bbb-3(b)(1), unless the authorization is terminated or revoked.  Performed at [facility], [place] [address] [place], [place] [address]      Radiological Exams on Admission: CT HEAD WO CONTRAST (5MM)  Result Date: 05/22/2022 CLINICAL DATA:  Altered mental status, hemoptysis, ETOH EXAM: CT HEAD WITHOUT CONTRAST TECHNIQUE: Contiguous axial images were obtained from the base of the skull through the vertex without intravenous contrast. RADIATION DOSE REDUCTION: This exam was performed according to the departmental dose-optimization program which includes automated exposure control, adjustment of the mA and/or kV according to patient size and/or use of iterative reconstruction technique. COMPARISON:  04/12/2020 FINDINGS: Brain: No evidence of acute infarction, hemorrhage, hydrocephalus, extra-axial collection or mass lesion/mass effect. Mild cortical atrophy. Mild subcortical white matter and periventricular small vessel ischemic  changes. Vascular: No hyperdense vessel or unexpected calcification. Skull: Normal. Negative for fracture or focal lesion. Sinuses/Orbits: The visualized paranasal sinuses are essentially clear. The mastoid air cells are unopacified. Other: None. IMPRESSION: No evidence of acute intracranial abnormality. Mild atrophy with mild small vessel ischemic changes. Electronically Signed   By: Julian Hy M.D.   On: 05/22/2022 01:54   CT ABDOMEN PELVIS W CONTRAST  Result Date: 05/22/2022 CLINICAL DATA:  Epigastric abdominal pain, vomiting, fever. Hemoptysis. History of ETOH. EXAM: CT ABDOMEN AND PELVIS WITH CONTRAST TECHNIQUE: Multidetector CT imaging of the abdomen and pelvis was performed using the standard protocol following bolus administration of intravenous contrast. RADIATION DOSE REDUCTION: This exam was performed according to the departmental dose-optimization program which includes automated  exposure control, adjustment of the mA and/or kV according to patient size and/or use of iterative reconstruction technique. CONTRAST:  33m OMNIPAQUE IOHEXOL 350 MG/ML SOLN COMPARISON:  11/23/2018 FINDINGS: Lower chest: Subpleural patchy opacity in the medial right lower lobe (series 5/image 1), incompletely visualized. Differential considerations include aspiration versus pneumonia. Hepatobiliary: Mildly nodular hepatic contour, suggesting early cirrhosis. Gallbladder is unremarkable. No intrahepatic or extrahepatic duct dilatation. Pancreas: Within normal limits. Spleen: Within normal limits Adrenals/Urinary Tract: Adrenal glands are within normal limits. Bilateral renal cysts, measuring up to 14 mm in the medial right lower kidney (series 3/image 36), benign (Bosniak I). Additional 10 mm cyst in the lateral interpolar left kidney (series 3/image 31) with an associated calcification, unchanged from 2020, benign. No follow-up is recommended. No hydronephrosis. Bladder is within normal limits. Stomach/Bowel: Mild  wall thickening involving the distal esophagus (series 3/image 6) with a suspected tiny hiatal hernia. This appearance favors reflux esophagitis. No evidence of bowel obstruction. Normal appendix (coronal image 49). No colonic wall thickening or inflammatory changes. Vascular/Lymphatic: No evidence of abdominal aortic aneurysm. Atherosclerotic calcifications of the abdominal aorta and branch vessels. Portal vein is patent. No suspicious abdominopelvic lymphadenopathy. Reproductive: Prostate is unremarkable. Other: No abdominopelvic ascites. Tiny fat containing bilateral inguinal hernias (series 3/image 82). Musculoskeletal: Visualized osseous structures are within normal limits. IMPRESSION: Subpleural patchy opacity in the medial right lower lobe, incompletely visualized, suggesting aspiration versus pneumonia. Mild wall thickening involving the distal esophagus, correlate for esophagitis. Associated tiny hiatal hernia. Early cirrhosis.  Portal vein is patent. Additional ancillary findings as above. Electronically Signed   By: SJulian HyM.D.   On: 05/22/2022 01:53   DG Chest Port 1 View  Result Date: 05/22/2022 CLINICAL DATA:  Sepsis, vomiting EXAM: PORTABLE CHEST 1 VIEW COMPARISON:  07/05/2021 FINDINGS: Heart and mediastinal contours are within normal limits. No focal opacities or effusions. No acute bony abnormality. IMPRESSION: No active disease. Electronically Signed   By: KRolm BaptiseM.D.   On: 05/22/2022 00:22    Assessment/Plan Present on Admission:  Pneumonia due to COVID-19 virus/Tmax 104.2/sepsis  Pneumonia, aspiration vs CAP (HCC)/lactic acidosis -Admit to med progressive care -Blood and urine cultures collected -Ice packing, plus cooling blankets ordered (cooling blanket not available in ER) -Patient has received IV Rocephin/Flagyl/azithromycin.  Change antibiotics to cefepime and vancomycin -Patient is on room air but given liver cirrhosis and severity of illness, will treat  COVID with remdesivir.  Decadron not ordered.  Patient hemodynamically stable -Albuterol MDI as needed -Oxygen if needed. -IV fluid hydration, follow lactic acid curve until normalized -Follow fever curve until normalized.  Tylenol every 6 hours as needed.  Unable to use NSAIDs given GI bleed.   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   Alcohol abuse/Early cirrhosis -CIWA protocol initiated -Folic acid, multivitamin, vitamin B1 initiated  Acute metabolic encephalopathy -Secondary to above.  Improving   Chronic thrombocytopenia ([facility] -Secondary to cirrhosis/sepsis/COVID -Fibrinogen ordered.  INR ordered   HTN (hypertension) -Norvasc resumed   Ilene Witcher 05/22/2022, 3:59 AM

## 2022-05-22 NOTE — ED Notes (Signed)
Wife Lucien Budney [phone] would like an update asap

## 2022-05-22 NOTE — ED Notes (Signed)
Urine all over floor in room. Room, bed, and floor cleaned.

## 2022-05-22 NOTE — Progress Notes (Signed)
Pharmacy Antibiotic Note  Stephen Bowers is a 61 y.o. male admitted on 05/21/2022 with pneumonia.  Pharmacy has been consulted for Vancomycin and Cefepime  dosing.  Plan: Vancomycin 1500 mg IV now, then 1250 mg IV q12h Cefepime 2 g IV q8h  Height: '5\' 11"'$  (180.3 cm) Weight: 89.8 kg (198 lb) IBW/kg (Calculated) : 75.3  Temp (24hrs), Avg:103.2 F (39.6 C), Min:102.2 F (39 C), Max:104.2 F (40.1 C)  Recent Labs  Lab 05/21/22 2348 05/22/22 0200  WBC 9.3  --   CREATININE 0.92  --   LATICACIDVEN 4.9* 2.6*    Estimated Creatinine Clearance: 92.1 mL/min (by C-G formula based on SCr of 0.92 mg/dL).    No Known Allergies  Caryl Pina 05/22/2022 5:47 AM

## 2022-05-22 NOTE — Progress Notes (Addendum)
PROGRESS NOTE    Stephen Bowers  VVO:[id number] DOB: 1961/02/21 DOA: 05/21/2022 PCP: Ladell Pier, MD   Brief Narrative:  HPI: This is a 61 year old male with past medical history hypertension, CKD, dyslipidemia, alcohol use, peptic ulcer disease, thrombocytopenia and liver cirrhosis.  EMS called because the patient was confused, had nausea, vomiting and hematemesis.     On arrival to the ER he was febrile at 103 and COVID-positive.  Imaging showed pneumonia vs aspiration pneumonia.  Patient had lactic acidosis of 4.8, decreased to 2.6 after sepsis bundle IVF bolus.  Imaging also confirms liver cirrhosis.  Platelets 42 (patient has chronic thrombocytopenia).  In the ER patient received 1000 mg of Tylenol, Tmax increased to 104.2 oral.  Antibiotics given includes Rocephin, Flagyl and azithromycin.  Blood cultures x2 and urine culture collected.  80 mg IV Protonix also given.   Patient does not recall the events that brought him here.  Patient states he was simply dizzy.  He endorses drinking 3-4 shots of brandy and 3-4 bottles every weekend..  He states he does not get withdrawal symptoms.    Assessment & Plan:   Principal Problem:   Alcoholic gastritis with bleeding Active Problems:   HTN (hypertension)   Hyperlipidemia   Alcohol use disorder, mild, abuse   Seizure-like activity (HCC)   Thrombocytopenia (HCC)   Alcohol abuse   Duodenal ulcer   Pneumonia due to COVID-19 virus   Aspiration pneumonia (HCC)   Lactic acid acidosis   Chronic liver failure (HCC)   Gout  Severe sepsis COVID-19 pneumonia/possible aspiration pneumonia: Chest x-ray unremarkable. He is febrile with Tmax of 104.2.  Meets criteria for severe sepsis based on fever, tachycardia, tachypnea and lactic acid of> 2.  Overall improving.  Fever coming down.  Lactic acidosis improving as well.  Leukocytosis improving as well.  I will check procalcitonin, if elevated, continue current broad-spectrum antibiotics, if  negative, will discontinue antibiotics.  We will also check for respiratory viral panel.  Continue remdesivir.  No chest x-ray findings and he is not hypoxic, will not start on dexamethasone.  Patient was encouraged to prone, out of bed to chair, to use incentive spirometry and flutter valve.  Addendum: Due to elevated D-dimer, CTA chest was obtained, PE ruled out.  Procalcitonin significantly elevated.  CT shows consolidation, suggesting bacterial pneumonia with elevated procalcitonin we will discontinue cefepime and vancomycin and start on Zosyn.  History of alcoholic gastritis/?  Hematemesis/anemia of chronic disease: Patient is a poor historian.  Unsure whether he had hematemesis or hemoptysis.  He cannot recall anything at all.  He says that he came in because of the dizziness.  Hemoglobin slightly dropped, likely due to hemodilution.  GI has seen him.  No plans for EGD.  Starting on clear liquid diet, twice daily Protonix.  Monitor for now.  Closely monitor hemoglobin.  Alcoholic liver cirrhosis/coagulopathy: INR slightly elevated at 1.1.  Patient noncompliant, continues to drink, last drink almost 2 days ago.  Will avoid Tylenol.  Slightly elevated LFTs than his baseline, AST much more elevated, ALT is normal.  Indicative of alcoholic liver disease.  Monitor closely.  Hypokalemia: We will replace.  Hypomagnesemia: Replace.  Acute metabolic encephalopathy: Secondary to all of the above.  Improving.  Currently fully alert and oriented.  Chronic thrombocytopenia: Secondary to liver cirrhosis and now with sepsis and COVID.  Stable.  Essential hypertension: Controlled.  Continue Norvasc.  DVT prophylaxis: SCDs Start: 05/22/22 0435   Code Status: Full Code  Family Communication:  None present at bedside.  Plan of care discussed with patient in length and he/she verbalized understanding and agreed with it.  Status is: Inpatient Remains inpatient appropriate because: Patient very sick and  septic.   Estimated body mass index is 27.62 kg/m as calculated from the following:   Height as of this encounter: '5\' 11"'$  (1.803 m).   Weight as of this encounter: 89.8 kg.    Nutritional Assessment: Body mass index is 27.62 kg/m.[name] Seen by dietician.  I agree with the assessment and plan as outlined below: Nutrition Status:        . Skin Assessment: I have examined the patient's skin and I agree with the wound assessment as performed by the wound care RN as outlined below:    Consultants:  GI  Procedures:  None  Antimicrobials:  Anti-infectives (From admission, onward)    Start     Dose/Rate Route Frequency Ordered Stop   05/23/22 1000  remdesivir 100 mg in sodium chloride 0.9 % 100 mL IVPB       See Hyperspace for full Linked Orders Report.   100 mg 200 mL/hr over 30 Minutes Intravenous Daily 05/22/22 0448 05/25/22 0959   05/22/22 2200  vancomycin (VANCOREADY) IVPB 1250 mg/250 mL        1,250 mg 166.7 mL/hr over 90 Minutes Intravenous Every 12 hours 05/22/22 0550     05/22/22 1400  ceFEPIme (MAXIPIME) 2 g in sodium chloride 0.9 % 100 mL IVPB        2 g 200 mL/hr over 30 Minutes Intravenous Every 8 hours 05/22/22 0550     05/22/22 0630  remdesivir 200 mg in sodium chloride 0.9% 250 mL IVPB       See Hyperspace for full Linked Orders Report.   200 mg 580 mL/hr over 30 Minutes Intravenous Once 05/22/22 0448 05/22/22 0651   05/22/22 0630  vancomycin (VANCOREADY) IVPB 1500 mg/300 mL        1,500 mg 150 mL/hr over 120 Minutes Intravenous  Once 05/22/22 0550 05/22/22 0903   05/22/22 0200  azithromycin (ZITHROMAX) 500 mg in sodium chloride 0.9 % 250 mL IVPB  Status:  Discontinued        500 mg 250 mL/hr over 60 Minutes Intravenous Every 24 hours 05/22/22 0159 05/22/22 0541   05/22/22 0015  cefTRIAXone (ROCEPHIN) 2 g in sodium chloride 0.9 % 100 mL IVPB        2 g 200 mL/hr over 30 Minutes Intravenous  Once 05/22/22 0000 05/22/22 0105   05/22/22 0015  metroNIDAZOLE  (FLAGYL) IVPB 500 mg  Status:  Discontinued        500 mg 100 mL/hr over 60 Minutes Intravenous Every 12 hours 05/22/22 0000 05/22/22 0542         Subjective: Patient seen and examined.  He complains of being tired.  No other complaint.  No abdominal pain, chest pain or hematemesis.  Objective: Vitals:   05/22/22 0623 05/22/22 0705 05/22/22 0730 05/22/22 0900  BP:   (!) 159/86 (!) 167/94  Pulse:   90 (!) 101  Resp:   (!) 23 18  Temp: (!) 102.2 F (39 C) (!) 101.7 F (38.7 C)    TempSrc: Oral Oral    SpO2:   97% 97%  Weight:      Height:        Intake/Output Summary (Last 24 hours) at 05/22/2022 5465 Last data filed at 05/22/2022 0651 Gross per 24 hour  Intake 2807.3 ml  Output --  Net 2807.3 ml   Filed Weights   05/21/22 2346  Weight: 89.8 kg    Examination:  General exam: Appears calm and comfortable but also appears very tired. Respiratory system: Clear to auscultation. Respiratory effort normal. Cardiovascular system: S1 & S2 heard, RRR. No JVD, murmurs, rubs, gallops or clicks. No pedal edema. Gastrointestinal system: Abdomen is slightly distended but soft and nontender.  No organomegaly or masses felt. Normal bowel sounds heard. Central nervous system: Alert and oriented. No focal neurological deficits. Extremities: Symmetric 5 x 5 power. Skin: No rashes, lesions or ulcers   Data Reviewed: I have personally reviewed following labs and imaging studies  CBC: Recent Labs  Lab 05/21/22 2348 05/22/22 0509  WBC 9.3 11.3*  NEUTROABS 8.1* 9.8*  HGB 9.0* 8.2*  HCT 28.3* 24.7*  MCV 86.8 84.9  PLT 42* 39*   Basic Metabolic Panel: Recent Labs  Lab 05/21/22 2348 05/22/22 0509  NA 137 139  K 3.7 3.4*  CL 104 107  CO2 15* 17*  GLUCOSE 142* 137*  BUN 7 7  CREATININE 0.92 0.88  CALCIUM 8.2* 7.6*  MG  --  1.2*  PHOS  --  3.2   GFR: Estimated Creatinine Clearance: 96.3 mL/min (by C-G formula based on SCr of 0.88 mg/dL). Liver Function Tests: Recent  Labs  Lab 05/21/22 2348 05/22/22 0509  AST 195* 191*  ALT 44 41  ALKPHOS 32* 27*  BILITOT 0.3 0.5  PROT 8.2* 7.4  ALBUMIN 3.7 3.2*   Recent Labs  Lab 05/21/22 2348  LIPASE 27   Recent Labs  Lab 05/22/22 0509  AMMONIA 28   Coagulation Profile: Recent Labs  Lab 05/21/22 2348 05/22/22 0509  INR 1.3* 1.3*   Cardiac Enzymes: No results for input(s): "CKTOTAL", "CKMB", "CKMBINDEX", "TROPONINI" in the last 168 hours. BNP (last 3 results) No results for input(s): "PROBNP" in the last 8760 hours. HbA1C: No results for input(s): "HGBA1C" in the last 72 hours. CBG: No results for input(s): "GLUCAP" in the last 168 hours. Lipid Profile: Recent Labs    05/22/22 0509  TRIG 82   Thyroid Function Tests: No results for input(s): "TSH", "T4TOTAL", "FREET4", "T3FREE", "THYROIDAB" in the last 72 hours. Anemia Panel: Recent Labs    05/22/22 0509  FERRITIN 16*   Sepsis Labs: Recent Labs  Lab 05/21/22 2348 05/22/22 0200  LATICACIDVEN 4.9* 2.6*    Recent Results (from the past 240 hour(s))  Resp Panel by RT-PCR (Flu A&B, Covid)     Status: Abnormal   Collection Time: 05/21/22 12:02 AM   Specimen: Nasal Swab  Result Value Ref Range Status   SARS Coronavirus 2 by RT PCR POSITIVE (A) NEGATIVE Final    Comment: (NOTE) SARS-CoV-2 target nucleic acids are DETECTED.  The SARS-CoV-2 RNA is generally detectable in upper respiratory specimens during the acute phase of infection. Positive results are indicative of the presence of the identified virus, but do not rule out bacterial infection or co-infection with other pathogens not detected by the test. Clinical correlation with patient history and other diagnostic information is necessary to determine patient infection status. The expected result is Negative.  Fact Sheet for Patients: EntrepreneurPulse.com.au  Fact Sheet for Healthcare Providers: IncredibleEmployment.be  This test is  not yet approved or cleared by the [place] FDA and  has been authorized for detection and/or diagnosis of SARS-CoV-2 by FDA under an Emergency Use Authorization (EUA).  This EUA will remain in effect (meaning this test can be used) for  the duration of  the COVID-19 declaration under Section 564(b)(1) of the A ct, 21 U.S.C. section 360bbb-3(b)(1), unless the authorization is terminated or revoked sooner.     Influenza A by PCR NEGATIVE NEGATIVE Final   Influenza B by PCR NEGATIVE NEGATIVE Final    Comment: (NOTE) The Xpert Xpress SARS-CoV-2/FLU/RSV plus assay is intended as an aid in the diagnosis of influenza from Nasopharyngeal swab specimens and should not be used as a sole basis for treatment. Nasal washings and aspirates are unacceptable for Xpert Xpress SARS-CoV-2/FLU/RSV testing.  Fact Sheet for Patients: EntrepreneurPulse.com.au  Fact Sheet for Healthcare Providers: IncredibleEmployment.be  This test is not yet approved or cleared by the [place] FDA and has been authorized for detection and/or diagnosis of SARS-CoV-2 by FDA under an Emergency Use Authorization (EUA). This EUA will remain in effect (meaning this test can be used) for the duration of the COVID-19 declaration under Section 564(b)(1) of the Act, 21 U.S.C. section 360bbb-3(b)(1), unless the authorization is terminated or revoked.  Performed at [facility], [place] [address] [place], [place] [address]   Blood culture (routine x 2)     Status: None (Preliminary result)   Collection Time: 05/21/22 11:56 PM   Specimen: BLOOD  Result Value Ref Range Status   Specimen Description BLOOD SITE NOT SPECIFIED  Final   Special Requests   Final    BOTTLES DRAWN AEROBIC AND ANAEROBIC Blood Culture results may not be optimal due to an excessive volume of blood received in culture bottles   Culture   Final    NO GROWTH < 12 HOURS Performed at [facility],  [place] [address], [place], [place] [address]    Report Status PENDING  Incomplete  Blood culture (routine x 2)     Status: None (Preliminary result)   Collection Time: 05/21/22 11:56 PM   Specimen: BLOOD  Result Value Ref Range Status   Specimen Description BLOOD SITE NOT SPECIFIED  Final   Special Requests   Final    BOTTLES DRAWN AEROBIC AND ANAEROBIC Blood Culture results may not be optimal due to an inadequate volume of blood received in culture bottles   Culture   Final    NO GROWTH < 12 HOURS Performed at [facility], [place] [address] [place], [place] [address]    Report Status PENDING  Incomplete     Radiology Studies: CT HEAD WO CONTRAST (5MM)  Result Date: 05/22/2022 CLINICAL DATA:  Altered mental status, hemoptysis, ETOH EXAM: CT HEAD WITHOUT CONTRAST TECHNIQUE: Contiguous axial images were obtained from the base of the skull through the vertex without intravenous contrast. RADIATION DOSE REDUCTION: This exam was performed according to the departmental dose-optimization program which includes automated exposure control, adjustment of the mA and/or kV according to patient size and/or use of iterative reconstruction technique. COMPARISON:  04/12/2020 FINDINGS: Brain: No evidence of acute infarction, hemorrhage, hydrocephalus, extra-axial collection or mass lesion/mass effect. Mild cortical atrophy. Mild subcortical white matter and periventricular small vessel ischemic changes. Vascular: No hyperdense vessel or unexpected calcification. Skull: Normal. Negative for fracture or focal lesion. Sinuses/Orbits: The visualized paranasal sinuses are essentially clear. The mastoid air cells are unopacified. Other: None. IMPRESSION: No evidence of acute intracranial abnormality. Mild atrophy with mild small vessel ischemic changes. Electronically Signed   By: Julian Hy M.D.   On: 05/22/2022 01:54   CT ABDOMEN PELVIS W CONTRAST  Result Date: 05/22/2022 CLINICAL DATA:  Epigastric  abdominal pain, vomiting, fever. Hemoptysis.  History of ETOH. EXAM: CT ABDOMEN AND PELVIS WITH CONTRAST TECHNIQUE: Multidetector CT imaging of the abdomen and pelvis was performed using the standard protocol following bolus administration of intravenous contrast. RADIATION DOSE REDUCTION: This exam was performed according to the departmental dose-optimization program which includes automated exposure control, adjustment of the mA and/or kV according to patient size and/or use of iterative reconstruction technique. CONTRAST:  29m OMNIPAQUE IOHEXOL 350 MG/ML SOLN COMPARISON:  11/23/2018 FINDINGS: Lower chest: Subpleural patchy opacity in the medial right lower lobe (series 5/image 1), incompletely visualized. Differential considerations include aspiration versus pneumonia. Hepatobiliary: Mildly nodular hepatic contour, suggesting early cirrhosis. Gallbladder is unremarkable. No intrahepatic or extrahepatic duct dilatation. Pancreas: Within normal limits. Spleen: Within normal limits Adrenals/Urinary Tract: Adrenal glands are within normal limits. Bilateral renal cysts, measuring up to 14 mm in the medial right lower kidney (series 3/image 36), benign (Bosniak I). Additional 10 mm cyst in the lateral interpolar left kidney (series 3/image 31) with an associated calcification, unchanged from 2020, benign. No follow-up is recommended. No hydronephrosis. Bladder is within normal limits. Stomach/Bowel: Mild wall thickening involving the distal esophagus (series 3/image 6) with a suspected tiny hiatal hernia. This appearance favors reflux esophagitis. No evidence of bowel obstruction. Normal appendix (coronal image 49). No colonic wall thickening or inflammatory changes. Vascular/Lymphatic: No evidence of abdominal aortic aneurysm. Atherosclerotic calcifications of the abdominal aorta and branch vessels. Portal vein is patent. No suspicious abdominopelvic lymphadenopathy. Reproductive: Prostate is unremarkable. Other: No  abdominopelvic ascites. Tiny fat containing bilateral inguinal hernias (series 3/image 82). Musculoskeletal: Visualized osseous structures are within normal limits. IMPRESSION: Subpleural patchy opacity in the medial right lower lobe, incompletely visualized, suggesting aspiration versus pneumonia. Mild wall thickening involving the distal esophagus, correlate for esophagitis. Associated tiny hiatal hernia. Early cirrhosis.  Portal vein is patent. Additional ancillary findings as above. Electronically Signed   By: SJulian HyM.D.   On: 05/22/2022 01:53   DG Chest Port 1 View  Result Date: 05/22/2022 CLINICAL DATA:  Sepsis, vomiting EXAM: PORTABLE CHEST 1 VIEW COMPARISON:  07/05/2021 FINDINGS: Heart and mediastinal contours are within normal limits. No focal opacities or effusions. No acute bony abnormality. IMPRESSION: No active disease. Electronically Signed   By: KRolm BaptiseM.D.   On: 05/22/2022 00:22    Scheduled Meds:  amLODipine  10 mg Oral Daily   folic acid  1 mg Oral Daily   multivitamin with minerals  1 tablet Oral Daily   [START ON 05/25/2022] pantoprazole  40 mg Intravenous Q12H   potassium chloride  40 mEq Oral Once   thiamine  100 mg Oral Daily   Or   thiamine  100 mg Intravenous Daily   Continuous Infusions:  sodium chloride 100 mL/hr at 05/22/22 0517   ceFEPime (MAXIPIME) IV     magnesium sulfate bolus IVPB     pantoprazole 8 mg/hr (05/22/22 0029)   [START ON 05/23/2022] remdesivir 100 mg in sodium chloride 0.9 % 100 mL IVPB     vancomycin    Total time spent: 31 min   LOS: 0 days   RDarliss Cheney MD [facility]  05/22/2022, 9:27 AM   *Please note that this is a verbal dictation therefore any spelling or grammatical errors are due to the "DFancy FarmOne" system interpretation.  Please page via [facility] do not message via secure chat for urgent patient care matters. Secure chat can be used for non urgent patient care matters.  How to contact the  [facility] or  Consulting provider Rafael Hernandez or covering provider during after hours [place], for this patient?  Check the care team in [facility] and look for a) attending/consulting [facility] provider listed and b) the [facility] team listed. Page or secure chat [phone]. Log into [email or web address] and use [place]'s universal password to access. If you do not have the password, please contact the hospital operator. Locate the [facility] provider you are looking for under [facility] and page to a number that you can be directly reached. If you still have difficulty reaching the provider, please page the [facility] (Director on Call) for the Hospitalists listed on amion for assistance.

## 2022-05-22 NOTE — Consult Note (Signed)
[facility] Consult Note   History Stephen Bowers MRN # [id number]  Date of Admission: 05/21/2022 Date of Consultation: 05/22/2022 Referring physician: Dr. Darliss Cheney, MD Primary Care Provider: Ladell Pier, MD Primary Gastroenterologist: Dr. Billie Lade   Reason for Consultation/Chief Complaint: Cirrhosis and hematemesis  Subjective  HPI:  This is a 61 year old man known to our practice most recently from an admission in January 2023, during which he underwent upper endoscopy for anemia with findings noted below. He has alcohol-related cirrhosis with ongoing alcohol use, currently admitted for several days of systemic illness with fever and confusion, found to be COVID-positive.  Chest x-ray without focal infiltrate, CT abdomen and pelvis reports a subpleural opacity.  He is being treated for COVID related pulmonary infection with broad-spectrum antibiotics and remdesivir. There are reports that he had vomiting at home with possible hematemesis or hemoptysis.  I do not see documentation of reported hematemesis or melena since admission overnight.  Patient denies having had vomiting or hematemesis, though his recollection is suspect. Currently denies chest pain or cough, though he does cough at times during this evaluation.  Denies abdominal pain, black or bloody stool.   ROS:  Constitutional: He is cold and shivering He does not think he has had any recent weight loss.  All other systems are negative except as noted above in the HPI  Past Medical History Past Medical History:  Diagnosis Date   Chronic musculoskeletal pain    CKD (chronic kidney disease)    follows with [facility] Hollie Salk)   High cholesterol Dx 2011   Hypertension    Pancolitis ([place])    Wears partial dentures    upper only    Past Surgical History Past Surgical History:  Procedure Laterality Date   BIOPSY  07/06/2021   Procedure: BIOPSY;  Surgeon: Irene Shipper, MD;  Location:  [facility] ENDOSCOPY;  Service: Endoscopy;;   COLONOSCOPY  12/2014   hx polyps - Pyrtle   ESOPHAGOGASTRODUODENOSCOPY (EGD) WITH PROPOFOL N/A 07/06/2021   Procedure: ESOPHAGOGASTRODUODENOSCOPY (EGD) WITH PROPOFOL;  Surgeon: Irene Shipper, MD;  Location: [facility] ENDOSCOPY;  Service: Endoscopy;  Laterality: N/A;   MULTIPLE TOOTH EXTRACTIONS      Family History Family History  Problem Relation Age of Onset   Liver disease Father    Alcohol abuse Father    Diabetes Sister    Obesity Sister    Cancer Mother        breast    Kidney disease Mother    Colon cancer Neg Hx    Esophageal cancer Neg Hx    Rectal cancer Neg Hx    Stomach cancer Neg Hx    Colon polyps Neg Hx     Social History Social History   Socioeconomic History   Marital status: Married    Spouse name: Malachy Mood   Number of children: 1    Years of education: 12    Highest education level: Not on file  Occupational History   Occupation: Unemployed   Tobacco Use   Smoking status: Former    Packs/day: 0.25    Years: 6.00    Total pack years: 1.50    Types: Cigarettes    Quit date: 1995    Years since quitting: 28.9   Smokeless tobacco: Never  Vaping Use   Vaping Use: Never used  Substance and Sexual Activity   Alcohol use: Yes    Alcohol/week: 6.0 standard drinks of alcohol    Types: 6 Cans of beer per  week    Comment: 6 drinks Beer /Liquor   Drug use: No   Sexual activity: Yes  Other Topics Concern   Not on file  Social History Narrative   Lives in an [place], family house.    Child goes to [employer] in [place], 72 yo F,          Social Determinants of Health   Financial Resource Strain: Not on file  Food Insecurity: Not on file  Transportation Needs: Not on file  Physical Activity: Not on file  Stress: Not on file  Social Connections: Not on file    Allergies No Known Allergies  Outpatient Meds Home medications from the H+P and/or nursing med reconciliation reviewed.  Inpatient med list  reviewed  _____________________________________________________________________ Objective   Exam:  Current vital signs  Patient Vitals for the past 8 hrs:  BP Temp Temp src Pulse Resp SpO2  05/22/22 0730 (!) 159/86 -- -- 90 (!) 23 97 %  05/22/22 0705 -- (!) 101.7 F (38.7 C) Oral -- -- --  05/22/22 0623 -- (!) 102.2 F (39 C) Oral -- -- --  05/22/22 0615 (!) 148/85 -- -- (!) 104 (!) 23 96 %  05/22/22 0556 -- (!) 102.4 F (39.1 C) Oral -- -- --  05/22/22 0508 -- (!) 104.2 F (40.1 C) Oral -- -- --  05/22/22 0415 (!) 146/94 -- -- (!) 113 (!) 23 98 %  05/22/22 0400 (!) 150/91 -- -- (!) 115 19 97 %  05/22/22 0332 -- (!) 102.2 F (39 C) Oral -- -- --  05/22/22 0115 126/74 -- -- (!) 108 (!) 25 92 %  05/22/22 0100 132/81 (!) 103 F (39.4 C) Oral (!) 110 20 95 %  05/22/22 0055 -- (!) 103.4 F (39.7 C) Oral -- -- --    Intake/Output Summary (Last 24 hours) at 05/22/2022 0854 Last data filed at 05/22/2022 1962 Gross per 24 hour  Intake 2807.3 ml  Output --  Net 2807.3 ml    Physical Exam:   General: this is an acutely ill-appearing man, shivering and alert. Eyes: sclera anicteric, no redness ENT: oral mucosa moist without lesions, no cervical or supraclavicular lymphadenopathy CV: RRR without murmur, S1/S2, no JVD,, no peripheral edema Resp: clear to auscultation bilaterally, normal RR and effort noted GI: soft, obese, no tenderness, with active bowel sounds. No guarding or palpable organomegaly noted (limited by body habitus) Skin; warm and dry, no rash or jaundice noted.  He has a large pimple with a whitehead over his left eyebrow.  Small rim of surrounding erythema Neuro: awake, alert and oriented x 3 (though it took him about 10 seconds to recall the month and year). Normal gross motor function and fluent speech.  Sluggish mentation  Labs:     Latest Ref Rng & Units 05/22/2022    5:09 AM 05/21/2022   11:48 PM 07/07/2021    3:03 AM  CBC  WBC 4.0 - 10.5 K/uL 11.3   9.3  10.8   Hemoglobin 13.0 - 17.0 g/dL 8.2  9.0  7.8   Hematocrit 39.0 - 52.0 % 24.7  28.3  25.0   Platelets 150 - 400 K/uL 39  42  120        Latest Ref Rng & Units 05/22/2022    5:09 AM 05/21/2022   11:48 PM 07/07/2021    3:03 AM  CMP  Glucose 70 - 99 mg/dL 137  142  124   BUN 6 - 20 mg/dL 7  7  8   Creatinine 0.61 - 1.24 mg/dL 0.88  0.92  0.74   Sodium 135 - 145 mmol/L 139  137  134   Potassium 3.5 - 5.1 mmol/L 3.4  3.7  3.4   Chloride 98 - 111 mmol/L 107  104  102   CO2 22 - 32 mmol/L '17  15  24   '$ Calcium 8.9 - 10.3 mg/dL 7.6  8.2  8.6   Total Protein 6.5 - 8.1 g/dL 7.4  8.2  7.6   Total Bilirubin 0.3 - 1.2 mg/dL 0.5  0.3  0.6   Alkaline Phos 38 - 126 U/L 27  32  28   AST 15 - 41 U/L 191  195  96   ALT 0 - 44 U/L 41  44  30     Recent Labs  Lab 05/22/22 0509  INR 1.3*    Ammonia level 28 ______  ___________________________________________________ Radiologic studies:  CLINICAL DATA:  Epigastric abdominal pain, vomiting, fever. Hemoptysis. History of ETOH.   EXAM: CT ABDOMEN AND PELVIS WITH CONTRAST   TECHNIQUE: Multidetector CT imaging of the abdomen and pelvis was performed using the standard protocol following bolus administration of intravenous contrast.   RADIATION DOSE REDUCTION: This exam was performed according to the departmental dose-optimization program which includes automated exposure control, adjustment of the mA and/or kV according to patient size and/or use of iterative reconstruction technique.   CONTRAST:  27m OMNIPAQUE IOHEXOL 350 MG/ML SOLN   COMPARISON:  11/23/2018   FINDINGS: Lower chest: Subpleural patchy opacity in the medial right lower lobe (series 5/image 1), incompletely visualized. Differential considerations include aspiration versus pneumonia.   Hepatobiliary: Mildly nodular hepatic contour, suggesting early cirrhosis.   Gallbladder is unremarkable. No intrahepatic or extrahepatic duct dilatation.   Pancreas:  Within normal limits.   Spleen: Within normal limits   Adrenals/Urinary Tract: Adrenal glands are within normal limits.   Bilateral renal cysts, measuring up to 14 mm in the medial right lower kidney (series 3/image 36), benign (Bosniak I). Additional 10 mm cyst in the lateral interpolar left kidney (series 3/image 31) with an associated calcification, unchanged from 2020, benign. No follow-up is recommended. No hydronephrosis.   Bladder is within normal limits.   Stomach/Bowel: Mild wall thickening involving the distal esophagus (series 3/image 6) with a suspected tiny hiatal hernia. This appearance favors reflux esophagitis.   No evidence of bowel obstruction.   Normal appendix (coronal image 49).   No colonic wall thickening or inflammatory changes.   Vascular/Lymphatic: No evidence of abdominal aortic aneurysm.   Atherosclerotic calcifications of the abdominal aorta and branch vessels.   Portal vein is patent.   No suspicious abdominopelvic lymphadenopathy.   Reproductive: Prostate is unremarkable.   Other: No abdominopelvic ascites.   Tiny fat containing bilateral inguinal hernias (series 3/image 82).   Musculoskeletal: Visualized osseous structures are within normal limits.   IMPRESSION: Subpleural patchy opacity in the medial right lower lobe, incompletely visualized, suggesting aspiration versus pneumonia.   Mild wall thickening involving the distal esophagus, correlate for esophagitis. Associated tiny hiatal hernia.   Early cirrhosis.  Portal vein is patent.   Additional ancillary findings as above.     Electronically Signed   By: SJulian HyM.D.   On: 05/22/2022 01:53   ______________________________________________________ Other studies:   _______________________________________________________ Assessment & Plan  Impression:  Cirrhosis with ongoing alcohol use, as recently as 2 days ago  [place]  No ascites on imaging,  thus not  SBP.  Cirrhosis/portal hypertension related thrombocytopenia, but below his baseline which may at least in part be due to systemic infection.  Mild coagulopathy, INR up from 1.1 last admission to 1.3 this admission, also perhaps at least in part due to systemic infection.  He does have ongoing alcohol use that may be contributing to worsening portal hypertension.  Hematemesis or possibly hemoptysis, reports are unclear and patient is a limited historian. At present, he is not showing signs of brisk GI bleeding, if he is GI bleeding at all.  No need to heme test stool, as it will likely be positive chronically in such a patient. Drop in hemoglobin overnight seems more likely volume resuscitation than ongoing bleeding.  Plan:  Clear liquid diet, will likely advance tomorrow if no signs of overt GI bleeding.  Twice daily Protonix, can change to oral if further IV access needed  No current plans for upper endoscopy.  Not clearly GI bleeding at present, and he is at significantly increased risk for sedation given his sepsis and COVID-related lower respiratory infection.  Sepsis picture from [place].  Small skin lesion over left eyebrow, does not appear likely causing systemic infection.  Broad-spectrum antibiotics have been started.  Whether or not he will need an upper endoscopy at all during this admission remains to be seen.  We will follow him.  Thank you for the courtesy of this consult.  Please contact me with any questions or concerns.  Nelida Meuse III Office: [phone]

## 2022-05-22 NOTE — Progress Notes (Signed)
Pharmacy Antibiotic Note  Stephen Bowers is a 61 y.o. male admitted on 05/21/2022 with pneumonia.  Pharmacy has been consulted for Vancomycin and Cefepime  dosing now to narrow to zosyn  Plan: Zosyn 3.375g IV every 8 hours (extended 4h infusion) Monitor renal function, LOT  Height: '5\' 11"'$  (180.3 cm) Weight: 89.8 kg (198 lb) IBW/kg (Calculated) : 75.3  Temp (24hrs), Avg:102.4 F (39.1 C), Min:99 F (37.2 C), Max:104.2 F (40.1 C)  Recent Labs  Lab 05/21/22 2348 05/22/22 0200 05/22/22 0509 05/22/22 0944  WBC 9.3  --  11.3*  --   CREATININE 0.92  --  0.88  --   LATICACIDVEN 4.9* 2.6*  --  4.7*     Estimated Creatinine Clearance: 96.3 mL/min (by C-G formula based on SCr of 0.88 mg/dL).    No Known Allergies  Bertis Ruddy 05/22/2022 11:35 AM

## 2022-05-22 NOTE — ED Notes (Signed)
ICE PACKS PLACED ON PT'S GROIN, HEAD, EACH AXILLA AND FAN PLACED AT BS TO COOL PATIENT.

## 2022-05-22 NOTE — ED Notes (Signed)
Date and time results received: 05/22/22 0117  (use smartphrase ".now" to insert current time)  Test: lactic acid Critical Value: 4.9  Name of Provider Notified: Sedonia Small, MD  Orders Received? Or Actions Taken?:

## 2022-05-23 DIAGNOSIS — K7030 Alcoholic cirrhosis of liver without ascites: Secondary | ICD-10-CM

## 2022-05-23 DIAGNOSIS — D638 Anemia in other chronic diseases classified elsewhere: Secondary | ICD-10-CM

## 2022-05-23 DIAGNOSIS — K920 Hematemesis: Secondary | ICD-10-CM

## 2022-05-23 LAB — COMPREHENSIVE METABOLIC PANEL
ALT: 38 U/L (ref 0–44)
AST: 131 U/L — ABNORMAL HIGH (ref 15–41)
Albumin: 2.9 g/dL — ABNORMAL LOW (ref 3.5–5.0)
Alkaline Phosphatase: 27 U/L — ABNORMAL LOW (ref 38–126)
Anion gap: 12 (ref 5–15)
BUN: 6 mg/dL (ref 6–20)
CO2: 18 mmol/L — ABNORMAL LOW (ref 22–32)
Calcium: 7.9 mg/dL — ABNORMAL LOW (ref 8.9–10.3)
Chloride: 104 mmol/L (ref 98–111)
Creatinine, Ser: 0.87 mg/dL (ref 0.61–1.24)
GFR, Estimated: >60 mL/min (ref >60)
Glucose, Bld: 102 mg/dL — ABNORMAL HIGH (ref 70–99)
Potassium: 3.5 mmol/L (ref 3.5–5.1)
Sodium: 134 mmol/L — ABNORMAL LOW (ref 135–145)
Total Bilirubin: 1.2 mg/dL (ref 0.3–1.2)
Total Protein: 7 g/dL (ref 6.5–8.1)

## 2022-05-23 LAB — MAGNESIUM: Magnesium: 2 mg/dL (ref 1.7–2.4)

## 2022-05-23 LAB — LACTIC ACID, PLASMA: Lactic Acid, Venous: 1.4 mmol/L (ref 0.5–1.9)

## 2022-05-23 LAB — LEGIONELLA PNEUMOPHILA SEROGP 1 UR AG: L. pneumophila Serogp 1 Ur Ag: NEGATIVE

## 2022-05-23 LAB — HEMOGLOBIN AND HEMATOCRIT, BLOOD
HCT: 23.6 % — ABNORMAL LOW (ref 39.0–52.0)
Hemoglobin: 7.9 g/dL — ABNORMAL LOW (ref 13.0–17.0)

## 2022-05-23 LAB — URINE CULTURE: Culture: NO GROWTH

## 2022-05-23 MED ORDER — PANTOPRAZOLE SODIUM 40 MG PO TBEC
40.0000 mg | DELAYED_RELEASE_TABLET | Freq: Two times a day (BID) | ORAL | Status: DC
  Administered 2022-05-23 – 2022-06-01 (×17): 40 mg via ORAL
  Filled 2022-05-23 (×18): qty 1

## 2022-05-23 NOTE — Progress Notes (Signed)
PROGRESS NOTE    Stephen Bowers  KDX:[id number] DOB: 1961-02-01 DOA: 05/21/2022 PCP: Ladell Pier, MD   Brief Narrative:  HPI: This is a 61 year old male with past medical history hypertension, CKD, dyslipidemia, alcohol use, peptic ulcer disease, thrombocytopenia and liver cirrhosis.  EMS called because the patient was confused, had nausea, vomiting and hematemesis.     On arrival to the ER he was febrile at 103 and COVID-positive.  Imaging showed pneumonia vs aspiration pneumonia.  Patient had lactic acidosis of 4.8, decreased to 2.6 after sepsis bundle IVF bolus.  Imaging also confirms liver cirrhosis.  Platelets 42 (patient has chronic thrombocytopenia).  In the ER patient received 1000 mg of Tylenol, Tmax increased to 104.2 oral.  Antibiotics given includes Rocephin, Flagyl and azithromycin.  Blood cultures x2 and urine culture collected.  80 mg IV Protonix also given.   Patient does not recall the events that brought him here.  Patient states he was simply dizzy.  He endorses drinking 3-4 shots of brandy and 3-4 bottles every weekend..  He states he does not get withdrawal symptoms.    Assessment & Plan:   Principal Problem:   Alcoholic gastritis with bleeding Active Problems:   HTN (hypertension)   Hyperlipidemia   Alcohol use disorder, mild, abuse   Seizure-like activity (HCC)   Thrombocytopenia (HCC)   Alcohol abuse   Duodenal ulcer   Pneumonia due to COVID-19 virus   Aspiration pneumonia (HCC)   Lactic acid acidosis   Chronic liver failure (HCC)   Gout  Severe sepsis COVID-19 pneumonia/possible aspiration pneumonia: Meets criteria for severe sepsis based on fever, tachycardia, tachypnea and lactic acid of> 2. Chest x-ray unremarkable but follow-up CTA indicated bilateral patchy lung opacities with a confluent opacity in the right lower lobe consistent with multifocal pneumonia.  Procalcitonin also elevated indicating superimposed bacterial pneumonia.  Last  temperature spike was around 4 PM on 05/22/2022.  Improving.  Lactic acidosis resolved.  Will recheck procalcitonin tomorrow.  Continue remdesivir.  No indication of dexamethasone.  Patient was encouraged to prone, out of bed to chair, to use incentive spirometry and flutter valve.  CTA negative for PE.  Continue Zosyn.  All cultures negative.  History of alcoholic gastritis/?  Hematemesis/anemia of chronic disease: Patient is a poor historian.  Unsure whether he had hematemesis or hemoptysis.  He cannot recall anything at all.  He says that he came in because of the dizziness.  Hemoglobin fairly stable.  GI has seen him, no plans for EGD.  Continue PPI.   Alcoholic liver cirrhosis/coagulopathy: INR slightly elevated at 1.1.  Patient noncompliant, continues to drink, last drink almost 2 days prior to admission.  Will avoid Tylenol.  Slightly elevated LFTs than his baseline, AST much more elevated, ALT is normal.  Indicative of alcoholic liver disease.  Monitor closely.  Hypokalemia: Resolved.  Hypomagnesemia: Replaced yesterday did recheck today.  Magnesium  Acute metabolic encephalopathy: Secondary to all of the above.  Improving.  Currently fully alert and oriented.  Chronic thrombocytopenia: Secondary to liver cirrhosis and now with sepsis and COVID.  Stable.  Essential hypertension: Controlled.  Continue Norvasc.  DVT prophylaxis: SCDs Start: 05/22/22 0435   Code Status: Full Code  Family Communication:  None present at bedside.  Plan of care discussed with patient in length and he/she verbalized understanding and agreed with it.  Status is: Inpatient Remains inpatient appropriate because: Patient improving but is still not medically ready for discharge.   Estimated body mass index  is 27.62 kg/m as calculated from the following:   Height as of this encounter: '5\' 11"'$  (1.803 m).   Weight as of this encounter: 89.8 kg.    Nutritional Assessment: Body mass index is 27.62 kg/m.[name] Seen  by dietician.  I agree with the assessment and plan as outlined below: Nutrition Status:        . Skin Assessment: I have examined the patient's skin and I agree with the wound assessment as performed by the wound care RN as outlined below:    Consultants:  GI  Procedures:  None  Antimicrobials:  Anti-infectives (From admission, onward)    Start     Dose/Rate Route Frequency Ordered Stop   05/23/22 1000  remdesivir 100 mg in sodium chloride 0.9 % 100 mL IVPB       See Hyperspace for full Linked Orders Report.   100 mg 200 mL/hr over 30 Minutes Intravenous Daily 05/22/22 0448 05/25/22 0959   05/22/22 2200  vancomycin (VANCOREADY) IVPB 1250 mg/250 mL  Status:  Discontinued        1,250 mg 166.7 mL/hr over 90 Minutes Intravenous Every 12 hours 05/22/22 0550 05/22/22 1124   05/22/22 1400  ceFEPIme (MAXIPIME) 2 g in sodium chloride 0.9 % 100 mL IVPB  Status:  Discontinued        2 g 200 mL/hr over 30 Minutes Intravenous Every 8 hours 05/22/22 0550 05/22/22 1125   05/22/22 1400  piperacillin-tazobactam (ZOSYN) IVPB 3.375 g        3.375 g 12.5 mL/hr over 240 Minutes Intravenous Every 8 hours 05/22/22 1137     05/22/22 0630  remdesivir 200 mg in sodium chloride 0.9% 250 mL IVPB       See Hyperspace for full Linked Orders Report.   200 mg 580 mL/hr over 30 Minutes Intravenous Once 05/22/22 0448 05/22/22 0651   05/22/22 0630  vancomycin (VANCOREADY) IVPB 1500 mg/300 mL        1,500 mg 150 mL/hr over 120 Minutes Intravenous  Once 05/22/22 0550 05/22/22 0935   05/22/22 0200  azithromycin (ZITHROMAX) 500 mg in sodium chloride 0.9 % 250 mL IVPB  Status:  Discontinued        500 mg 250 mL/hr over 60 Minutes Intravenous Every 24 hours 05/22/22 0159 05/22/22 0541   05/22/22 0015  cefTRIAXone (ROCEPHIN) 2 g in sodium chloride 0.9 % 100 mL IVPB        2 g 200 mL/hr over 30 Minutes Intravenous  Once 05/22/22 0000 05/22/22 0105   05/22/22 0015  metroNIDAZOLE (FLAGYL) IVPB 500 mg  Status:   Discontinued        500 mg 100 mL/hr over 60 Minutes Intravenous Every 12 hours 05/22/22 0000 05/22/22 0542         Subjective:  Patient seen and examined.  Much more alert and oriented today.  He has no complaints.  Denies shortness of breath or abdominal pain.  No episodes of hematemesis since admission.  Objective: Vitals:   05/23/22 0300 05/23/22 0745 05/23/22 0800 05/23/22 1024  BP: (!) 166/90 (!) 144/86 127/75 135/83  Pulse: (!) 106 100 95   Resp: (!) 36 (!) 23 (!) 23   Temp: 99.6 F (37.6 C)  99.6 F (37.6 C)   TempSrc: Oral  Oral   SpO2: 92% 93% 95%   Weight:      Height:        Intake/Output Summary (Last 24 hours) at 05/23/2022 1034 Last data filed at 05/22/2022 7564 Gross  per 24 hour  Intake 139.43 ml  Output 600 ml  Net -460.57 ml    Filed Weights   05/21/22 2346  Weight: 89.8 kg    Examination:  General exam: Appears calm and comfortable  Respiratory system: Rhonchi bilaterally. Respiratory effort normal. Cardiovascular system: S1 & S2 heard, RRR. No JVD, murmurs, rubs, gallops or clicks. No pedal edema. Gastrointestinal system: Abdomen is nondistended, soft and nontender. No organomegaly or masses felt. Normal bowel sounds heard. Central nervous system: Alert and oriented. No focal neurological deficits. Extremities: Symmetric 5 x 5 power. Skin: No rashes, lesions or ulcers.  Psychiatry: Judgement and insight appear normal. Mood & affect appropriate.    Data Reviewed: I have personally reviewed following labs and imaging studies  CBC: Recent Labs  Lab 05/21/22 2348 05/22/22 0509 05/22/22 1826 05/23/22 0332  WBC 9.3 11.3*  --   --   NEUTROABS 8.1* 9.8*  --   --   HGB 9.0* 8.2* 7.5* 7.9*  HCT 28.3* 24.7* 23.1* 23.6*  MCV 86.8 84.9  --   --   PLT 42* 39*  --   --     Basic Metabolic Panel: Recent Labs  Lab 05/21/22 2348 05/22/22 0509 05/23/22 0332  NA 137 139 134*  K 3.7 3.4* 3.5  CL 104 107 104  CO2 15* 17* 18*  GLUCOSE 142*  137* 102*  BUN '7 7 6  '$ CREATININE 0.92 0.88 0.87  CALCIUM 8.2* 7.6* 7.9*  MG  --  1.2*  --   PHOS  --  3.2  --     GFR: Estimated Creatinine Clearance: 97.4 mL/min (by C-G formula based on SCr of 0.87 mg/dL). Liver Function Tests: Recent Labs  Lab 05/21/22 2348 05/22/22 0509 05/23/22 0332  AST 195* 191* 131*  ALT 44 41 38  ALKPHOS 32* 27* 27*  BILITOT 0.3 0.5 1.2  PROT 8.2* 7.4 7.0  ALBUMIN 3.7 3.2* 2.9*    Recent Labs  Lab 05/21/22 2348  LIPASE 27    Recent Labs  Lab 05/22/22 0509  AMMONIA 28    Coagulation Profile: Recent Labs  Lab 05/21/22 2348 05/22/22 0509  INR 1.3* 1.3*    Cardiac Enzymes: No results for input(s): "CKTOTAL", "CKMB", "CKMBINDEX", "TROPONINI" in the last 168 hours. BNP (last 3 results) No results for input(s): "PROBNP" in the last 8760 hours. HbA1C: No results for input(s): "HGBA1C" in the last 72 hours. CBG: No results for input(s): "GLUCAP" in the last 168 hours. Lipid Profile: Recent Labs    05/22/22 0509  TRIG 82    Thyroid Function Tests: No results for input(s): "TSH", "T4TOTAL", "FREET4", "T3FREE", "THYROIDAB" in the last 72 hours. Anemia Panel: Recent Labs    05/22/22 0509  FERRITIN 16*    Sepsis Labs: Recent Labs  Lab 05/22/22 0200 05/22/22 0944 05/22/22 1440 05/23/22 0821  PROCALCITON  --  11.59  --   --   LATICACIDVEN 2.6* 4.7* 3.4* 1.4     Recent Results (from the past 240 hour(s))  Resp Panel by RT-PCR (Flu A&B, Covid)     Status: Abnormal   Collection Time: 05/21/22 12:02 AM   Specimen: Nasal Swab  Result Value Ref Range Status   SARS Coronavirus 2 by RT PCR POSITIVE (A) NEGATIVE Final    Comment: (NOTE) SARS-CoV-2 target nucleic acids are DETECTED.  The SARS-CoV-2 RNA is generally detectable in upper respiratory specimens during the acute phase of infection. Positive results are indicative of the presence of the identified virus, but do  not rule out bacterial infection or co-infection with  other pathogens not detected by the test. Clinical correlation with patient history and other diagnostic information is necessary to determine patient infection status. The expected result is Negative.  Fact Sheet for Patients: EntrepreneurPulse.com.au  Fact Sheet for Healthcare Providers: IncredibleEmployment.be  This test is not yet approved or cleared by the [place] FDA and  has been authorized for detection and/or diagnosis of SARS-CoV-2 by FDA under an Emergency Use Authorization (EUA).  This EUA will remain in effect (meaning this test can be used) for the duration of  the COVID-19 declaration under Section 564(b)(1) of the A ct, 21 U.S.C. section 360bbb-3(b)(1), unless the authorization is terminated or revoked sooner.     Influenza A by PCR NEGATIVE NEGATIVE Final   Influenza B by PCR NEGATIVE NEGATIVE Final    Comment: (NOTE) The Xpert Xpress SARS-CoV-2/FLU/RSV plus assay is intended as an aid in the diagnosis of influenza from Nasopharyngeal swab specimens and should not be used as a sole basis for treatment. Nasal washings and aspirates are unacceptable for Xpert Xpress SARS-CoV-2/FLU/RSV testing.  Fact Sheet for Patients: EntrepreneurPulse.com.au  Fact Sheet for Healthcare Providers: IncredibleEmployment.be  This test is not yet approved or cleared by the [place] FDA and has been authorized for detection and/or diagnosis of SARS-CoV-2 by FDA under an Emergency Use Authorization (EUA). This EUA will remain in effect (meaning this test can be used) for the duration of the COVID-19 declaration under Section 564(b)(1) of the Act, 21 U.S.C. section 360bbb-3(b)(1), unless the authorization is terminated or revoked.  Performed at [facility], [place] [address] [place], [place] [address]   Blood culture (routine x 2)     Status: None (Preliminary result)   Collection Time:  05/21/22 11:56 PM   Specimen: BLOOD  Result Value Ref Range Status   Specimen Description BLOOD SITE NOT SPECIFIED  Final   Special Requests   Final    BOTTLES DRAWN AEROBIC AND ANAEROBIC Blood Culture results may not be optimal due to an excessive volume of blood received in culture bottles   Culture   Final    NO GROWTH 1 DAY Performed at [facility], [place] [address] [place], [place] [address]    Report Status PENDING  Incomplete  Blood culture (routine x 2)     Status: None (Preliminary result)   Collection Time: 05/21/22 11:56 PM   Specimen: BLOOD  Result Value Ref Range Status   Specimen Description BLOOD SITE NOT SPECIFIED  Final   Special Requests   Final    BOTTLES DRAWN AEROBIC AND ANAEROBIC Blood Culture results may not be optimal due to an inadequate volume of blood received in culture bottles   Culture   Final    NO GROWTH 1 DAY Performed at [facility], [place] [address], [place], [place] [address]    Report Status PENDING  Incomplete  Urine Culture     Status: None   Collection Time: 05/22/22  4:49 AM   Specimen: Urine, Clean Catch  Result Value Ref Range Status   Specimen Description URINE, CLEAN CATCH  Final   Special Requests NONE  Final   Culture   Final    NO GROWTH Performed at [facility], [place] [address], [place], [place] [address]    Report Status 05/23/2022 FINAL  Final  MRSA Next Gen by PCR, Nasal     Status: None   Collection Time: 05/22/22  7:33 AM   Specimen: Nasal Mucosa; Nasal Swab  Result Value Ref Range Status   MRSA by PCR Next Gen NOT DETECTED NOT DETECTED Final    Comment: (NOTE) The GeneXpert MRSA Assay (FDA approved for NASAL specimens only), is one component of a comprehensive MRSA colonization surveillance program. It is not intended to diagnose MRSA infection nor to guide or monitor treatment for MRSA infections. Test performance is not FDA approved in patients less than 69 years old. Performed at [facility], [place] [address], [place], [place] [address]      Radiology Studies: CT Angio Chest Pulmonary Embolism (PE) W or WO Contrast  Result Date: 05/22/2022 CLINICAL DATA:  Altered mental status and vomiting blood. Patient has fever. EXAM: CT ANGIOGRAPHY CHEST WITH CONTRAST TECHNIQUE: Multidetector CT imaging of the chest was performed using the standard protocol during bolus administration of intravenous contrast. Multiplanar CT image reconstructions and MIPs were obtained to evaluate the vascular anatomy. RADIATION DOSE REDUCTION: This exam was performed according to the departmental dose-optimization program which includes automated exposure control, adjustment of the mA and/or kV according to patient size and/or use of iterative reconstruction technique. CONTRAST:  58m OMNIPAQUE IOHEXOL 350 MG/ML SOLN COMPARISON:  None Available. FINDINGS: Cardiovascular: Satisfactory opacification of the pulmonary arteries to the segmental level. No evidence of pulmonary embolism. Normal heart size. No pericardial effusion. Mild coronary artery and atherosclerotic calcifications. Mediastinum/Nodes: No enlarged mediastinal, hilar, or axillary lymph nodes. Thyroid gland, trachea, and esophagus demonstrate no significant findings. Lungs/Pleura: There are bilateral patchy lung opacities with a confluent opacity in the right lower lobe with air bronchograms consistent with multifocal bilateral pneumonia. No pleural effusion or pneumothorax. Upper Abdomen: Mild thickening of the distal esophageal wall with small hiatal hernia. Musculoskeletal: No chest wall abnormality. No acute or significant osseous findings. Chronic healed fractures of the right 9/10 ribs. Review of the MIP images confirms the above findings. IMPRESSION: 1. No evidence of pulmonary embolism. 2. Bilateral patchy lung opacities with a confluent opacity in the right lower lobe with air bronchograms consistent with multifocal bilateral pneumonia. 3. Mild  coronary artery and atherosclerotic calcifications. 4. Mild thickening of the distal esophageal wall with small hiatal hernia. Gastroenterology consultation and endoscopy for further evaluation is recommended if not previously performed. Aortic Atherosclerosis (ICD10-I70.0). Electronically Signed   By: IKeane PoliceD.O.   On: 05/22/2022 10:23   CT HEAD WO CONTRAST (5MM)  Result Date: 05/22/2022 CLINICAL DATA:  Altered mental status, hemoptysis, ETOH EXAM: CT HEAD WITHOUT CONTRAST TECHNIQUE: Contiguous axial images were obtained from the base of the skull through the vertex without intravenous contrast. RADIATION DOSE REDUCTION: This exam was performed according to the departmental dose-optimization program which includes automated exposure control, adjustment of the mA and/or kV according to patient size and/or use of iterative reconstruction technique. COMPARISON:  04/12/2020 FINDINGS: Brain: No evidence of acute infarction, hemorrhage, hydrocephalus, extra-axial collection or mass lesion/mass effect. Mild cortical atrophy. Mild subcortical white matter and periventricular small vessel ischemic changes. Vascular: No hyperdense vessel or unexpected calcification. Skull: Normal. Negative for fracture or focal lesion. Sinuses/Orbits: The visualized paranasal sinuses are essentially clear. The mastoid air cells are unopacified. Other: None. IMPRESSION: No evidence of acute intracranial abnormality. Mild atrophy with mild small vessel ischemic changes. Electronically Signed   By: SJulian HyM.D.   On: 05/22/2022 01:54   CT ABDOMEN PELVIS W CONTRAST  Result Date: 05/22/2022 CLINICAL DATA:  Epigastric abdominal pain, vomiting, fever. Hemoptysis. History of ETOH. EXAM:  CT ABDOMEN AND PELVIS WITH CONTRAST TECHNIQUE: Multidetector CT imaging of the abdomen and pelvis was performed using the standard protocol following bolus administration of intravenous contrast. RADIATION DOSE REDUCTION: This exam was  performed according to the departmental dose-optimization program which includes automated exposure control, adjustment of the mA and/or kV according to patient size and/or use of iterative reconstruction technique. CONTRAST:  104m OMNIPAQUE IOHEXOL 350 MG/ML SOLN COMPARISON:  11/23/2018 FINDINGS: Lower chest: Subpleural patchy opacity in the medial right lower lobe (series 5/image 1), incompletely visualized. Differential considerations include aspiration versus pneumonia. Hepatobiliary: Mildly nodular hepatic contour, suggesting early cirrhosis. Gallbladder is unremarkable. No intrahepatic or extrahepatic duct dilatation. Pancreas: Within normal limits. Spleen: Within normal limits Adrenals/Urinary Tract: Adrenal glands are within normal limits. Bilateral renal cysts, measuring up to 14 mm in the medial right lower kidney (series 3/image 36), benign (Bosniak I). Additional 10 mm cyst in the lateral interpolar left kidney (series 3/image 31) with an associated calcification, unchanged from 2020, benign. No follow-up is recommended. No hydronephrosis. Bladder is within normal limits. Stomach/Bowel: Mild wall thickening involving the distal esophagus (series 3/image 6) with a suspected tiny hiatal hernia. This appearance favors reflux esophagitis. No evidence of bowel obstruction. Normal appendix (coronal image 49). No colonic wall thickening or inflammatory changes. Vascular/Lymphatic: No evidence of abdominal aortic aneurysm. Atherosclerotic calcifications of the abdominal aorta and branch vessels. Portal vein is patent. No suspicious abdominopelvic lymphadenopathy. Reproductive: Prostate is unremarkable. Other: No abdominopelvic ascites. Tiny fat containing bilateral inguinal hernias (series 3/image 82). Musculoskeletal: Visualized osseous structures are within normal limits. IMPRESSION: Subpleural patchy opacity in the medial right lower lobe, incompletely visualized, suggesting aspiration versus pneumonia. Mild  wall thickening involving the distal esophagus, correlate for esophagitis. Associated tiny hiatal hernia. Early cirrhosis.  Portal vein is patent. Additional ancillary findings as above. Electronically Signed   By: SJulian HyM.D.   On: 05/22/2022 01:53   DG Chest Port 1 View  Result Date: 05/22/2022 CLINICAL DATA:  Sepsis, vomiting EXAM: PORTABLE CHEST 1 VIEW COMPARISON:  07/05/2021 FINDINGS: Heart and mediastinal contours are within normal limits. No focal opacities or effusions. No acute bony abnormality. IMPRESSION: No active disease. Electronically Signed   By: KRolm BaptiseM.D.   On: 05/22/2022 00:22    Scheduled Meds:  amLODipine  10 mg Oral Daily   folic acid  1 mg Oral Daily   multivitamin with minerals  1 tablet Oral Daily   pantoprazole  40 mg Intravenous Q12H   thiamine  100 mg Oral Daily   Or   thiamine  100 mg Intravenous Daily   Continuous Infusions:  sodium chloride 100 mL/hr (05/22/22 1140)   piperacillin-tazobactam (ZOSYN)  IV 3.375 g (05/23/22 0636)   remdesivir 100 mg in sodium chloride 0.9 % 100 mL IVPB       LOS: 1 day   RDarliss Cheney MD [facility]  05/23/2022, 10:34 AM   *Please note that this is a verbal dictation therefore any spelling or grammatical errors are due to the "DVan HorneOne" system interpretation.  Please page via [place] do not message via secure chat for urgent patient care matters. Secure chat can be used for non urgent patient care matters.  How to contact the [facility] CenterAttending or Consulting provider [place] covering provider during after hours [place] for this patient?  Check the care team in [facility] Centerand look for a) attending/consulting TR[facility] provider listed and b) the [facility] Ctrteam listed. Page or secure chat  7A-7P. Log into [email or web address] and use [place]'s universal password to access. If you do not have the password, please contact the hospital operator. Locate the [facility] provider you are looking for under [facility]  and page to a number that you can be directly reached. If you still have difficulty reaching the provider, please page the [facility] (Director on Call) for the Hospitalists listed on amion for assistance.

## 2022-05-23 NOTE — Progress Notes (Addendum)
Daily Rounding Note  05/23/2022, 1:09 PM  LOS: 1 day   SUBJECTIVE:   Chief complaint:   Reports of home hematemesis and/or hemoptysis.  No nausea, no vomiting.  Stools brown yesterday and today.  Tolerating solid food.  Overall feeling better.  Coughing up yellow-colored sputum.  Denies significant dyspnea.  OBJECTIVE:         Vital signs in last 24 hours:    Temp:  [99.3 F (37.4 C)-101.7 F (38.7 C)] 99.3 F (37.4 C) (11/21 1149) Pulse Rate:  [93-107] 93 (11/21 1149) Resp:  [22-36] 22 (11/21 1149) BP: (127-166)/(71-98) 133/82 (11/21 1149) SpO2:  [88 %-95 %] 90 % (11/21 1149) Last BM Date : 05/23/22 Filed Weights   05/21/22 2346  Weight: 89.8 kg   General: Looks well, NAD, comfortable Heart: RRR. Chest: Somewhat diminished breath sounds at the bases but clear without labored breathing.  No cough. Abdomen: Soft without tenderness.  No distention.  Active bowel sounds. Extremities: No CCE. Neuro/Psych: Alert.  Appropriate.  No tremors, no asterixis.  Oriented x3.  No gross deficits, did not perform testing for limb strength.  Intake/Output from previous day: 11/20 0701 - 11/21 0700 In: 451.5 [I.V.:14.9; IV Piggyback:436.6] Out: 600 [Urine:600]  Intake/Output this shift: No intake/output data recorded.  Lab Results: Recent Labs    05/21/22 2348 05/22/22 0509 05/22/22 1826 05/23/22 0332  WBC 9.3 11.3*  --   --   HGB 9.0* 8.2* 7.5* 7.9*  HCT 28.3* 24.7* 23.1* 23.6*  PLT 42* 39*  --   --    BMET Recent Labs    05/21/22 2348 05/22/22 0509 05/23/22 0332  NA 137 139 134*  K 3.7 3.4* 3.5  CL 104 107 104  CO2 15* 17* 18*  GLUCOSE 142* 137* 102*  BUN '7 7 6  '$ CREATININE 0.92 0.88 0.87  CALCIUM 8.2* 7.6* 7.9*   LFT Recent Labs    05/21/22 2348 05/22/22 0509 05/23/22 0332  PROT 8.2* 7.4 7.0  ALBUMIN 3.7 3.2* 2.9*  AST 195* 191* 131*  ALT 44 41 38  ALKPHOS 32* 27* 27*  BILITOT 0.3 0.5 1.2    PT/INR Recent Labs    05/21/22 2348 05/22/22 0509  LABPROT 15.5* 16.3*  INR 1.3* 1.3*   Hepatitis Panel No results for input(s): "HEPBSAG", "HCVAB", "HEPAIGM", "HEPBIGM" in the last 72 hours.  Studies/Results: CT Angio Chest Pulmonary Embolism (PE) W or WO Contrast  Result Date: 05/22/2022 CLINICAL DATA:  Altered mental status and vomiting blood. Patient has fever. EXAM: CT ANGIOGRAPHY CHEST WITH CONTRAST TECHNIQUE: Multidetector CT imaging of the chest was performed using the standard protocol during bolus administration of intravenous contrast. Multiplanar CT image reconstructions and MIPs were obtained to evaluate the vascular anatomy. RADIATION DOSE REDUCTION: This exam was performed according to the departmental dose-optimization program which includes automated exposure control, adjustment of the mA and/or kV according to patient size and/or use of iterative reconstruction technique. CONTRAST:  26m OMNIPAQUE IOHEXOL 350 MG/ML SOLN COMPARISON:  None Available. FINDINGS: Cardiovascular: Satisfactory opacification of the pulmonary arteries to the segmental level. No evidence of pulmonary embolism. Normal heart size. No pericardial effusion. Mild coronary artery and atherosclerotic calcifications. Mediastinum/Nodes: No enlarged mediastinal, hilar, or axillary lymph nodes. Thyroid gland, trachea, and esophagus demonstrate no significant findings. Lungs/Pleura: There are bilateral patchy lung opacities with a confluent opacity in the right lower lobe with air bronchograms consistent with multifocal bilateral pneumonia. No pleural effusion or pneumothorax. Upper Abdomen:  Mild thickening of the distal esophageal wall with small hiatal hernia. Musculoskeletal: No chest wall abnormality. No acute or significant osseous findings. Chronic healed fractures of the right 9/10 ribs. Review of the MIP images confirms the above findings. IMPRESSION: 1. No evidence of pulmonary embolism. 2. Bilateral patchy  lung opacities with a confluent opacity in the right lower lobe with air bronchograms consistent with multifocal bilateral pneumonia. 3. Mild coronary artery and atherosclerotic calcifications. 4. Mild thickening of the distal esophageal wall with small hiatal hernia. Gastroenterology consultation and endoscopy for further evaluation is recommended if not previously performed. Aortic Atherosclerosis (ICD10-I70.0). Electronically Signed   By: Keane Police D.O.   On: 05/22/2022 10:23   CT HEAD WO CONTRAST (5MM)  Result Date: 05/22/2022 CLINICAL DATA:  Altered mental status, hemoptysis, ETOH EXAM: CT HEAD WITHOUT CONTRAST TECHNIQUE: Contiguous axial images were obtained from the base of the skull through the vertex without intravenous contrast. RADIATION DOSE REDUCTION: This exam was performed according to the departmental dose-optimization program which includes automated exposure control, adjustment of the mA and/or kV according to patient size and/or use of iterative reconstruction technique. COMPARISON:  04/12/2020 FINDINGS: Brain: No evidence of acute infarction, hemorrhage, hydrocephalus, extra-axial collection or mass lesion/mass effect. Mild cortical atrophy. Mild subcortical white matter and periventricular small vessel ischemic changes. Vascular: No hyperdense vessel or unexpected calcification. Skull: Normal. Negative for fracture or focal lesion. Sinuses/Orbits: The visualized paranasal sinuses are essentially clear. The mastoid air cells are unopacified. Other: None. IMPRESSION: No evidence of acute intracranial abnormality. Mild atrophy with mild small vessel ischemic changes. Electronically Signed   By: Julian Hy M.D.   On: 05/22/2022 01:54   CT ABDOMEN PELVIS W CONTRAST  Result Date: 05/22/2022 CLINICAL DATA:  Epigastric abdominal pain, vomiting, fever. Hemoptysis. History of ETOH. EXAM: CT ABDOMEN AND PELVIS WITH CONTRAST TECHNIQUE: Multidetector CT imaging of the abdomen and pelvis  was performed using the standard protocol following bolus administration of intravenous contrast. RADIATION DOSE REDUCTION: This exam was performed according to the departmental dose-optimization program which includes automated exposure control, adjustment of the mA and/or kV according to patient size and/or use of iterative reconstruction technique. CONTRAST:  63m OMNIPAQUE IOHEXOL 350 MG/ML SOLN COMPARISON:  11/23/2018 FINDINGS: Lower chest: Subpleural patchy opacity in the medial right lower lobe (series 5/image 1), incompletely visualized. Differential considerations include aspiration versus pneumonia. Hepatobiliary: Mildly nodular hepatic contour, suggesting early cirrhosis. Gallbladder is unremarkable. No intrahepatic or extrahepatic duct dilatation. Pancreas: Within normal limits. Spleen: Within normal limits Adrenals/Urinary Tract: Adrenal glands are within normal limits. Bilateral renal cysts, measuring up to 14 mm in the medial right lower kidney (series 3/image 36), benign (Bosniak I). Additional 10 mm cyst in the lateral interpolar left kidney (series 3/image 31) with an associated calcification, unchanged from 2020, benign. No follow-up is recommended. No hydronephrosis. Bladder is within normal limits. Stomach/Bowel: Mild wall thickening involving the distal esophagus (series 3/image 6) with a suspected tiny hiatal hernia. This appearance favors reflux esophagitis. No evidence of bowel obstruction. Normal appendix (coronal image 49). No colonic wall thickening or inflammatory changes. Vascular/Lymphatic: No evidence of abdominal aortic aneurysm. Atherosclerotic calcifications of the abdominal aorta and branch vessels. Portal vein is patent. No suspicious abdominopelvic lymphadenopathy. Reproductive: Prostate is unremarkable. Other: No abdominopelvic ascites. Tiny fat containing bilateral inguinal hernias (series 3/image 82). Musculoskeletal: Visualized osseous structures are within normal limits.  IMPRESSION: Subpleural patchy opacity in the medial right lower lobe, incompletely visualized, suggesting aspiration versus pneumonia. Mild wall thickening involving the  distal esophagus, correlate for esophagitis. Associated tiny hiatal hernia. Early cirrhosis.  Portal vein is patent. Additional ancillary findings as above. Electronically Signed   By: Julian Hy M.D.   On: 05/22/2022 01:53   DG Chest Port 1 View  Result Date: 05/22/2022 CLINICAL DATA:  Sepsis, vomiting EXAM: PORTABLE CHEST 1 VIEW COMPARISON:  07/05/2021 FINDINGS: Heart and mediastinal contours are within normal limits. No focal opacities or effusions. No acute bony abnormality. IMPRESSION: No active disease. Electronically Signed   By: Rolm Baptise M.D.   On: 05/22/2022 00:22    Scheduled Meds:  amLODipine  10 mg Oral Daily   folic acid  1 mg Oral Daily   multivitamin with minerals  1 tablet Oral Daily   pantoprazole  40 mg Intravenous Q12H   thiamine  100 mg Oral Daily   Or   thiamine  100 mg Intravenous Daily   Continuous Infusions:  sodium chloride 100 mL/hr (05/22/22 1140)   piperacillin-tazobactam (ZOSYN)  IV 3.375 g (05/23/22 0636)   remdesivir 100 mg in sodium chloride 0.9 % 100 mL IVPB 100 mg (05/23/22 1042)   PRN Meds:.acetaminophen **OR** acetaminophen, albuterol, LORazepam **OR** LORazepam, ondansetron **OR** ondansetron (ZOFRAN) IV   ASSESMENT:   Hematemesis/possible hemoptysis reported, patient does not recall this.  07/2001 EGD (for IDA, melena, FOBT +): Nonbleeding duodenal ulcer, gastric erosions.  Per meds PTA review does not appear to have ever picked up prescription for Protonix 40 mg daily written in 07/2021.  And patient confirms he has not been taking any medicine for stomach acid or ulcers, just to be sleep aid and blood pressure meds.  Currently on Protonix 40 iv bid.    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  Other than AST of 131 and albumin of 2.9, LFTs are normal.    ETOH use disorder,  not in remission, drinking up until 2 days PTA.  Mild coagulopathy.    Covid 19 + w PNA, sepsis.  Remdesivir, Zosyn in place.    Anemia.  Normocytic. Hgb 9.. 7.9.  No PRBC transfusions this admi Diagnosed with IDA in January 2023.  Received infusion Ferrlecit, prescribed oral iron.  Thrombocytopenia.  Hyponatremia.  Adenomatous, HP colon polyps 2016.  Colonoscopy 03/2018 with 3 small hyperplastic polyps, nonbleeding hemorrhoids.  Surveillance colonoscopy suggested for 03/2023 (dr [name])  PLAN   Switch to Protonix 40 mg po bid.  Continue HH diet.  Does not seen EGD is clearly indicated but will defer to Dr Loletha Carrow.  Pt tells me the attending MD said he may discharge home tmrw.      Azucena Freed  05/23/2022, 1:09 PM Phone [phone]  I have taken an interval history, thoroughly reviewed the chart and examined the patient. I agree with the Advanced Practitioner's note, impression and recommendations, and have recorded additional findings, impressions and recommendations below. I performed a substantive portion of this encounter (>50% time spent), including a complete performance of the medical decision making.  My additional thoughts are as follows:  No witnessed hematemesis since admission, no melena.  Drop in hemoglobin consistent with volume resuscitation. Underlying anemia, thrombocytopenia and coagulopathy from alcohol-related cirrhosis.  It is not clear if he had small-volume hematemesis or hemoptysis, and he cannot clarify that.  At any rate, there is no evidence of ongoing GI bleeding, and I do not think he needs an upper endoscopy. History of small duodenal ulcer in the setting of NSAID use, diagnosed on EGD January of this year. Recommend pantoprazole 40  mg twice daily for 4 weeks and complete cessation of alcohol intake indefinitely.  Outpatient follow-up with Dr. [name] in our office will be arranged for long-term management of his cirrhosis.  No changes in therapy from  my perspective, no plans for endoscopic testing, and he can be discharged home from my perspective when medical service feels he is ready.   Nelida Meuse III [phone]

## 2022-05-24 LAB — COMPREHENSIVE METABOLIC PANEL
ALT: 36 U/L (ref 0–44)
AST: 110 U/L — ABNORMAL HIGH (ref 15–41)
Albumin: 3.2 g/dL — ABNORMAL LOW (ref 3.5–5.0)
Alkaline Phosphatase: 35 U/L — ABNORMAL LOW (ref 38–126)
Anion gap: 16 — ABNORMAL HIGH (ref 5–15)
BUN: 11 mg/dL (ref 6–20)
CO2: 17 mmol/L — ABNORMAL LOW (ref 22–32)
Calcium: 8.4 mg/dL — ABNORMAL LOW (ref 8.9–10.3)
Chloride: 101 mmol/L (ref 98–111)
Creatinine, Ser: 0.79 mg/dL (ref 0.61–1.24)
GFR, Estimated: >60 mL/min (ref >60)
Glucose, Bld: 92 mg/dL (ref 70–99)
Potassium: 3.4 mmol/L — ABNORMAL LOW (ref 3.5–5.1)
Sodium: 134 mmol/L — ABNORMAL LOW (ref 135–145)
Total Bilirubin: 1.2 mg/dL (ref 0.3–1.2)
Total Protein: 7.4 g/dL (ref 6.5–8.1)

## 2022-05-24 LAB — CBC WITH DIFFERENTIAL/PLATELET
Abs Immature Granulocytes: 0.3 10*3/uL — ABNORMAL HIGH (ref 0.00–0.07)
Basophils Absolute: 0.1 10*3/uL (ref 0.0–0.1)
Basophils Relative: 0 %
Eosinophils Absolute: 0 10*3/uL (ref 0.0–0.5)
Eosinophils Relative: 0 %
HCT: 24.3 % — ABNORMAL LOW (ref 39.0–52.0)
Hemoglobin: 8.3 g/dL — ABNORMAL LOW (ref 13.0–17.0)
Immature Granulocytes: 2 %
Lymphocytes Relative: 7 %
Lymphs Abs: 1.4 10*3/uL (ref 0.7–4.0)
MCH: 28.4 pg (ref 26.0–34.0)
MCHC: 34.2 g/dL (ref 30.0–36.0)
MCV: 83.2 fL (ref 80.0–100.0)
Monocytes Absolute: 1.1 10*3/uL — ABNORMAL HIGH (ref 0.1–1.0)
Monocytes Relative: 6 %
Neutro Abs: 17.1 10*3/uL — ABNORMAL HIGH (ref 1.7–7.7)
Neutrophils Relative %: 85 %
Platelets: 58 10*3/uL — ABNORMAL LOW (ref 150–400)
RBC: 2.92 MIL/uL — ABNORMAL LOW (ref 4.22–5.81)
RDW: 16 % — ABNORMAL HIGH (ref 11.5–15.5)
WBC: 20 10*3/uL — ABNORMAL HIGH (ref 4.0–10.5)
nRBC: 0.2 % (ref 0.0–0.2)

## 2022-05-24 LAB — PROCALCITONIN: Procalcitonin: 7.69 ng/mL

## 2022-05-24 LAB — GLUCOSE, CAPILLARY: Glucose-Capillary: 130 mg/dL — ABNORMAL HIGH (ref 70–99)

## 2022-05-24 MED ORDER — HALOPERIDOL LACTATE 5 MG/ML IJ SOLN
5.0000 mg | Freq: Four times a day (QID) | INTRAMUSCULAR | Status: DC | PRN
  Administered 2022-05-24 – 2022-05-29 (×2): 5 mg via INTRAVENOUS
  Filled 2022-05-24 (×3): qty 1

## 2022-05-24 MED ORDER — POTASSIUM CHLORIDE CRYS ER 20 MEQ PO TBCR
40.0000 meq | EXTENDED_RELEASE_TABLET | ORAL | Status: AC
  Administered 2022-05-24 (×2): 40 meq via ORAL
  Filled 2022-05-24 (×2): qty 2

## 2022-05-24 NOTE — Progress Notes (Signed)
Refusing tele and all monitoring

## 2022-05-24 NOTE — Progress Notes (Addendum)
Substance use resources placed on AVS for patient to follow up with after discharge d/t COVID precautions.  Gilmore Laroche, MSW, [facility]

## 2022-05-24 NOTE — Evaluation (Signed)
Physical Therapy Evaluation Patient Details Name: Stephen Bowers MRN: [id number] DOB: [birth year] Today's Date: 05/24/2022  History of Present Illness  61 y.o. male presents to [facility] hospital on 05/21/2022 with AMS, nausea, vomiting, hematemesis. Pt also found to be COVID+. PMH significant for alcohol use disorder, pancreatitis, HTN, CKD, HLD.  Clinical Impression  Pt presents to PT with deficits in gait and balance although able to tolerate multiple dynamic gait challenges without loss of balance. At this time pt demonstrates impaired ability to maintain balance with eyes closed. PT provides instruction on use of shower seat initially in an effort to improve stability when bathing. PT anticipates pt's balance will improve toward baseline level quickly once discharge and back home. PT recommends no post-acute PT services.  Ambulatory sats: 91% and greater on room air     Recommendations for follow up therapy are one component of a multi-disciplinary discharge planning process, led by the attending physician.  Recommendations may be updated based on patient status, additional functional criteria and insurance authorization.  Follow Up Recommendations No PT follow up (anticipate return to baseline with increased time and return home)      Assistance Recommended at Discharge PRN  Patient can return home with the following  Assistance with cooking/housework;A little help with bathing/dressing/bathroom;Direct supervision/assist for medications management;Direct supervision/assist for financial management;Assist for transportation    Equipment Recommendations None recommended by PT  Recommendations for Other Services       Functional Status Assessment Patient has had a recent decline in their functional status and demonstrates the ability to make significant improvements in function in a reasonable and predictable amount of time.     Precautions / Restrictions Precautions Precautions:  Fall Restrictions Weight Bearing Restrictions: No      Mobility  Bed Mobility                    Transfers Overall transfer level: Modified independent                 General transfer comment: use of hands, increased time    Ambulation/Gait Ambulation/Gait assistance: Supervision Gait Distance (Feet): 400 Feet Assistive device: None Gait Pattern/deviations: Step-through pattern Gait velocity: functional Gait velocity interpretation: >2.62 ft/sec, indicative of community ambulatory   General Gait Details: pt tolerates multiple dynamic gait challenges including head turns, changes in gait speed and stride length, backward ambulation, all without LOB.  Stairs            Wheelchair Mobility    Modified Rankin (Stroke Patients Only)       Balance Overall balance assessment: Needs assistance Sitting-balance support: No upper extremity supported, Feet supported Sitting balance-Leahy Scale: Good     Standing balance support: No upper extremity supported, During functional activity Standing balance-Leahy Scale: Fair           Rhomberg - Eyes Opened: 80 Rhomberg - Eyes Closed: 10 (posterior loss of balance)                 Pertinent Vitals/Pain Pain Assessment Pain Assessment: No/denies pain    Home Living Family/patient expects to be discharged to:: Private residence Living Arrangements: Spouse/significant other Available Help at Discharge: Available 24 hours/day Type of Home: Apartment Home Access: Stairs to enter Entrance Stairs-Rails: Right Entrance Stairs-Number of Steps: 4   Home Layout: One level Home Equipment: None Additional Comments: works in custodial services at [employer] Level of Function : Independent/Modified Independent;Driving  Hand Dominance   Dominant Hand: Right    Extremity/Trunk Assessment   Upper Extremity Assessment Upper Extremity  Assessment: Overall WFL for tasks assessed    Lower Extremity Assessment Lower Extremity Assessment: Overall WFL for tasks assessed    Cervical / Trunk Assessment Cervical / Trunk Assessment: Normal  Communication   Communication: No difficulties  Cognition Arousal/Alertness: Awake/alert Behavior During Therapy: Restless Overall Cognitive Status: Impaired/Different from baseline Area of Impairment: Attention, Safety/judgement, Awareness, Problem solving                   Current Attention Level: Selective     Safety/Judgement: Decreased awareness of safety, Decreased awareness of deficits Awareness: Emergent Problem Solving: Slow processing          General Comments General comments (skin integrity, edema, etc.): tachycardic into 120-130s with ambulaiton, SpO2 91% and above with mobility    Exercises     Assessment/Plan    PT Assessment Patient needs continued PT services  PT Problem List Decreased balance       PT Treatment Interventions Gait training;Balance training;Neuromuscular re-education;Patient/family education    PT Goals (Current goals can be found in the Care Plan section)  Acute Rehab PT Goals Patient Stated Goal: to go home PT Goal Formulation: With patient Time For Goal Achievement: 06/07/22 Potential to Achieve Goals: Good Additional Goals Additional Goal #1: Pt will score >19/24 on the DGI to indicate a reduced risk for falls Additional Goal #2: Pt will score >45/56 on the BERG to indicate a reduced risk for falls    Frequency Min 3X/week     Co-evaluation               AM-PAC PT "6 Clicks" Mobility  Outcome Measure Help needed turning from your back to your side while in a flat bed without using bedrails?: None Help needed moving from lying on your back to sitting on the side of a flat bed without using bedrails?: None Help needed moving to and from a bed to a chair (including a wheelchair)?: None Help needed standing up  from a chair using your arms (e.g., wheelchair or bedside chair)?: None Help needed to walk in hospital room?: A Little Help needed climbing 3-5 steps with a railing? : A Little 6 Click Score: 22    End of Session   Activity Tolerance: Patient tolerated treatment well Patient left: in chair;with call bell/phone within reach;with family/visitor present Nurse Communication: Mobility status PT Visit Diagnosis: Other abnormalities of gait and mobility (R26.89)    Time: 5456-2563 PT Time Calculation (min) (ACUTE ONLY): 14 min   Charges:   PT Evaluation $PT Eval Low Complexity: [place], PT, DPT Acute Rehabilitation Office [phone]   Zenaida Niece 05/24/2022, 11:53 AM

## 2022-05-24 NOTE — Progress Notes (Signed)
Mobility Specialist Progress Note:   05/24/22 0950  Mobility  Activity Ambulated independently in hallway;Ambulated with assistance in hallway  Level of Assistance Standby assist, set-up cues, supervision of patient - no hands on  Assistive Device None  Distance Ambulated (ft) 300 ft  Activity Response Tolerated well  Mobility Referral Yes  $Mobility charge 1 Mobility   Responded to bed alarm, pt not found in room. Pt ambulating independently in hallway, looking for room. Pt redirected to room, trying to call wife. Displayed steady gait throughout. Pt left in chair with all needs met, assisted with call to wife.   Nelta Numbers Mobility Specialist Please contact via SecureChat or  Rehab office at [phone]

## 2022-05-24 NOTE — Discharge Summary (Incomplete)
Physician Discharge Summary   Patient: Stephen Bowers MRN: [id number] DOB: [birth year]  Admit date:     05/21/2022  Discharge date: {dischdate:26783}  Discharge Physician: Darliss Cheney   PCP: Ladell Pier, MD   Recommendations at discharge:  {Tip this will not be part of the note when signed- Example include specific recommendations for outpatient follow-up, pending tests to follow-up on. (Optional):26781}  ***  Discharge Diagnoses: Principal Problem:   Alcoholic gastritis with bleeding Active Problems:   HTN (hypertension)   Hyperlipidemia   Alcohol use disorder, mild, abuse   Seizure-like activity (HCC)   Thrombocytopenia (HCC)   Alcohol abuse   Anemia of chronic disease   Duodenal ulcer   Pneumonia due to COVID-19 virus   Aspiration pneumonia (HCC)   Lactic acid acidosis   Chronic liver failure (HCC)   Gout   Hematemesis with nausea   Alcoholic cirrhosis of liver without ascites ([place])  Resolved Problems:   * No resolved hospital problems. [facility] Course: No notes on file  Assessment and Plan: No notes have been filed under this hospital service. Service: Hospitalist     {Tip this will not be part of the note when signed Body mass index is 27.62 kg/m. , ,  (Optional):26781}  {(NOTE) Pain control PDMP Statment (Optional):26782} Consultants: *** Procedures performed: ***  Disposition: {Plan; Disposition:26390} Diet recommendation:  {Diet_Plan:26776} DISCHARGE MEDICATION: Allergies as of 05/24/2022   No Known Allergies   Med Rec must be completed prior to using this [facility]***       Discharge Exam: Filed Weights   05/21/22 2346  Weight: 89.8 kg   ***  Condition at discharge: {DC Condition:26389}  The results of significant diagnostics from this hospitalization (including imaging, microbiology, ancillary and laboratory) are listed below for reference.   Imaging Studies: CT Angio Chest Pulmonary Embolism (PE) W or WO  Contrast  Result Date: 05/22/2022 CLINICAL DATA:  Altered mental status and vomiting blood. Patient has fever. EXAM: CT ANGIOGRAPHY CHEST WITH CONTRAST TECHNIQUE: Multidetector CT imaging of the chest was performed using the standard protocol during bolus administration of intravenous contrast. Multiplanar CT image reconstructions and MIPs were obtained to evaluate the vascular anatomy. RADIATION DOSE REDUCTION: This exam was performed according to the departmental dose-optimization program which includes automated exposure control, adjustment of the mA and/or kV according to patient size and/or use of iterative reconstruction technique. CONTRAST:  92m OMNIPAQUE IOHEXOL 350 MG/ML SOLN COMPARISON:  None Available. FINDINGS: Cardiovascular: Satisfactory opacification of the pulmonary arteries to the segmental level. No evidence of pulmonary embolism. Normal heart size. No pericardial effusion. Mild coronary artery and atherosclerotic calcifications. Mediastinum/Nodes: No enlarged mediastinal, hilar, or axillary lymph nodes. Thyroid gland, trachea, and esophagus demonstrate no significant findings. Lungs/Pleura: There are bilateral patchy lung opacities with a confluent opacity in the right lower lobe with air bronchograms consistent with multifocal bilateral pneumonia. No pleural effusion or pneumothorax. Upper Abdomen: Mild thickening of the distal esophageal wall with small hiatal hernia. Musculoskeletal: No chest wall abnormality. No acute or significant osseous findings. Chronic healed fractures of the right 9/10 ribs. Review of the MIP images confirms the above findings. IMPRESSION: 1. No evidence of pulmonary embolism. 2. Bilateral patchy lung opacities with a confluent opacity in the right lower lobe with air bronchograms consistent with multifocal bilateral pneumonia. 3. Mild coronary artery and atherosclerotic calcifications. 4. Mild thickening of the distal esophageal wall with small hiatal hernia.  Gastroenterology consultation and endoscopy for further evaluation is recommended if not previously  performed. Aortic Atherosclerosis (ICD10-I70.0). Electronically Signed   By: Keane Police D.O.   On: 05/22/2022 10:23   CT HEAD WO CONTRAST (5MM)  Result Date: 05/22/2022 CLINICAL DATA:  Altered mental status, hemoptysis, ETOH EXAM: CT HEAD WITHOUT CONTRAST TECHNIQUE: Contiguous axial images were obtained from the base of the skull through the vertex without intravenous contrast. RADIATION DOSE REDUCTION: This exam was performed according to the departmental dose-optimization program which includes automated exposure control, adjustment of the mA and/or kV according to patient size and/or use of iterative reconstruction technique. COMPARISON:  04/12/2020 FINDINGS: Brain: No evidence of acute infarction, hemorrhage, hydrocephalus, extra-axial collection or mass lesion/mass effect. Mild cortical atrophy. Mild subcortical white matter and periventricular small vessel ischemic changes. Vascular: No hyperdense vessel or unexpected calcification. Skull: Normal. Negative for fracture or focal lesion. Sinuses/Orbits: The visualized paranasal sinuses are essentially clear. The mastoid air cells are unopacified. Other: None. IMPRESSION: No evidence of acute intracranial abnormality. Mild atrophy with mild small vessel ischemic changes. Electronically Signed   By: Julian Hy M.D.   On: 05/22/2022 01:54   CT ABDOMEN PELVIS W CONTRAST  Result Date: 05/22/2022 CLINICAL DATA:  Epigastric abdominal pain, vomiting, fever. Hemoptysis. History of ETOH. EXAM: CT ABDOMEN AND PELVIS WITH CONTRAST TECHNIQUE: Multidetector CT imaging of the abdomen and pelvis was performed using the standard protocol following bolus administration of intravenous contrast. RADIATION DOSE REDUCTION: This exam was performed according to the departmental dose-optimization program which includes automated exposure control, adjustment of the mA  and/or kV according to patient size and/or use of iterative reconstruction technique. CONTRAST:  13m OMNIPAQUE IOHEXOL 350 MG/ML SOLN COMPARISON:  11/23/2018 FINDINGS: Lower chest: Subpleural patchy opacity in the medial right lower lobe (series 5/image 1), incompletely visualized. Differential considerations include aspiration versus pneumonia. Hepatobiliary: Mildly nodular hepatic contour, suggesting early cirrhosis. Gallbladder is unremarkable. No intrahepatic or extrahepatic duct dilatation. Pancreas: Within normal limits. Spleen: Within normal limits Adrenals/Urinary Tract: Adrenal glands are within normal limits. Bilateral renal cysts, measuring up to 14 mm in the medial right lower kidney (series 3/image 36), benign (Bosniak I). Additional 10 mm cyst in the lateral interpolar left kidney (series 3/image 31) with an associated calcification, unchanged from 2020, benign. No follow-up is recommended. No hydronephrosis. Bladder is within normal limits. Stomach/Bowel: Mild wall thickening involving the distal esophagus (series 3/image 6) with a suspected tiny hiatal hernia. This appearance favors reflux esophagitis. No evidence of bowel obstruction. Normal appendix (coronal image 49). No colonic wall thickening or inflammatory changes. Vascular/Lymphatic: No evidence of abdominal aortic aneurysm. Atherosclerotic calcifications of the abdominal aorta and branch vessels. Portal vein is patent. No suspicious abdominopelvic lymphadenopathy. Reproductive: Prostate is unremarkable. Other: No abdominopelvic ascites. Tiny fat containing bilateral inguinal hernias (series 3/image 82). Musculoskeletal: Visualized osseous structures are within normal limits. IMPRESSION: Subpleural patchy opacity in the medial right lower lobe, incompletely visualized, suggesting aspiration versus pneumonia. Mild wall thickening involving the distal esophagus, correlate for esophagitis. Associated tiny hiatal hernia. Early cirrhosis.  Portal  vein is patent. Additional ancillary findings as above. Electronically Signed   By: SJulian HyM.D.   On: 05/22/2022 01:53   DG Chest Port 1 View  Result Date: 05/22/2022 CLINICAL DATA:  Sepsis, vomiting EXAM: PORTABLE CHEST 1 VIEW COMPARISON:  07/05/2021 FINDINGS: Heart and mediastinal contours are within normal limits. No focal opacities or effusions. No acute bony abnormality. IMPRESSION: No active disease. Electronically Signed   By: KRolm BaptiseM.D.   On: 05/22/2022 00:22    Microbiology: Results  for orders placed or performed during the hospital encounter of 05/21/22  Resp Panel by RT-PCR (Flu A&B, Covid)     Status: Abnormal   Collection Time: 05/21/22 12:02 AM   Specimen: Nasal Swab  Result Value Ref Range Status   SARS Coronavirus 2 by RT PCR POSITIVE (A) NEGATIVE Final    Comment: (NOTE) SARS-CoV-2 target nucleic acids are DETECTED.  The SARS-CoV-2 RNA is generally detectable in upper respiratory specimens during the acute phase of infection. Positive results are indicative of the presence of the identified virus, but do not rule out bacterial infection or co-infection with other pathogens not detected by the test. Clinical correlation with patient history and other diagnostic information is necessary to determine patient infection status. The expected result is Negative.  Fact Sheet for Patients: EntrepreneurPulse.com.au  Fact Sheet for Healthcare Providers: IncredibleEmployment.be  This test is not yet approved or cleared by the [place] FDA and  has been authorized for detection and/or diagnosis of SARS-CoV-2 by FDA under an Emergency Use Authorization (EUA).  This EUA will remain in effect (meaning this test can be used) for the duration of  the COVID-19 declaration under Section 564(b)(1) of the A ct, 21 U.S.C. section 360bbb-3(b)(1), unless the authorization is terminated or revoked sooner.     Influenza A by PCR  NEGATIVE NEGATIVE Final   Influenza B by PCR NEGATIVE NEGATIVE Final    Comment: (NOTE) The Xpert Xpress SARS-CoV-2/FLU/RSV plus assay is intended as an aid in the diagnosis of influenza from Nasopharyngeal swab specimens and should not be used as a sole basis for treatment. Nasal washings and aspirates are unacceptable for Xpert Xpress SARS-CoV-2/FLU/RSV testing.  Fact Sheet for Patients: EntrepreneurPulse.com.au  Fact Sheet for Healthcare Providers: IncredibleEmployment.be  This test is not yet approved or cleared by the [place] FDA and has been authorized for detection and/or diagnosis of SARS-CoV-2 by FDA under an Emergency Use Authorization (EUA). This EUA will remain in effect (meaning this test can be used) for the duration of the COVID-19 declaration under Section 564(b)(1) of the Act, 21 U.S.C. section 360bbb-3(b)(1), unless the authorization is terminated or revoked.  Performed at [facility], [place] [address] [place], [place] [address]   Blood culture (routine x 2)     Status: None (Preliminary result)   Collection Time: 05/21/22 11:56 PM   Specimen: BLOOD  Result Value Ref Range Status   Specimen Description BLOOD SITE NOT SPECIFIED  Final   Special Requests   Final    BOTTLES DRAWN AEROBIC AND ANAEROBIC Blood Culture results may not be optimal due to an excessive volume of blood received in culture bottles   Culture   Final    NO GROWTH 2 DAYS Performed at [facility], [place] [address] [place], [place] [address]    Report Status PENDING  Incomplete  Blood culture (routine x 2)     Status: None (Preliminary result)   Collection Time: 05/21/22 11:56 PM   Specimen: BLOOD  Result Value Ref Range Status   Specimen Description BLOOD SITE NOT SPECIFIED  Final   Special Requests   Final    BOTTLES DRAWN AEROBIC AND ANAEROBIC Blood Culture results may not be optimal due to an inadequate volume of blood received in  culture bottles   Culture   Final    NO GROWTH 2 DAYS Performed at [facility], [place] [address], [place], [place] [address]    Report Status PENDING  Incomplete  Urine Culture     Status: None   Collection Time: 05/22/22  4:49 AM   Specimen: Urine, Clean Catch  Result Value Ref Range Status   Specimen Description URINE, CLEAN CATCH  Final   Special Requests NONE  Final   Culture   Final    NO GROWTH Performed at [facility], [address] [place], [place] [address]    Report Status 05/23/2022 FINAL  Final  MRSA Next Gen by PCR, Nasal     Status: None   Collection Time: 05/22/22  7:33 AM   Specimen: Nasal Mucosa; Nasal Swab  Result Value Ref Range Status   MRSA by PCR Next Gen NOT DETECTED NOT DETECTED Final    Comment: (NOTE) The GeneXpert MRSA Assay (FDA approved for NASAL specimens only), is one component of a comprehensive MRSA colonization surveillance program. It is not intended to diagnose MRSA infection nor to guide or monitor treatment for MRSA infections. Test performance is not FDA approved in patients less than 4 years old. Performed at [facility], [place] [address] [place],  [address]     Labs: CBC: Recent Labs  Lab 05/21/22 2348 05/22/22 0509 05/22/22 1826 05/23/22 0332 05/24/22 0554  WBC 9.3 11.3*  --   --  20.0*  NEUTROABS 8.1* 9.8*  --   --  17.1*  HGB 9.0* 8.2* 7.5* 7.9* 8.3*  HCT 28.3* 24.7* 23.1* 23.6* 24.3*  MCV 86.8 84.9  --   --  83.2  PLT 42* 39*  --   --  58*   Basic Metabolic Panel: Recent Labs  Lab 05/21/22 2348 05/22/22 0509 05/23/22 0332 05/24/22 0554  NA 137 139 134* 134*  K 3.7 3.4* 3.5 3.4*  CL 104 107 104 101  CO2 15* 17* 18* 17*  GLUCOSE 142* 137* 102* 92  BUN '7 7 6 11  '$ CREATININE 0.92 0.88 0.87 0.79  CALCIUM 8.2* 7.6* 7.9* 8.4*  MG  --  1.2* 2.0  --   PHOS  --  3.2  --   --    Liver Function Tests: Recent Labs  Lab 05/21/22 2348 05/22/22 0509 05/23/22 0332 05/24/22 0554  AST 195*  191* 131* 110*  ALT 44 41 38 36  ALKPHOS 32* 27* 27* 35*  BILITOT 0.3 0.5 1.2 1.2  PROT 8.2* 7.4 7.0 7.4  ALBUMIN 3.7 3.2* 2.9* 3.2*   CBG: No results for input(s): "GLUCAP" in the last 168 hours.  Discharge time spent: {LESS THAN/GREATER XFGH:82993} 30 minutes.  Signed: Darliss Cheney, MD [facility] 05/24/2022

## 2022-05-24 NOTE — Progress Notes (Signed)
Patient attempting to leave the unit. Was able to redirect back to room to wait for PT and OT evals.  MD aware

## 2022-05-24 NOTE — Evaluation (Signed)
Occupational Therapy Evaluation Patient Details Name: Stephen Bowers MRN: [id number] DOB: 31-Jul-1960 Today's Date: 05/24/2022   History of Present Illness 61 y.o. male presents to [facility] hospital on 05/21/2022 with AMS, nausea, vomiting, hematemesis. Pt also found to be COVID+. PMH significant for alcohol use disorder, pancreatitis, HTN, CKD, HLD.61 y.o. male presents to [facility] hospital on 05/21/2022 with AMS, nausea, vomiting, hematemesis. Pt also found to be COVID+. PMH significant for alcohol use disorder, pancreatitis, HTN, CKD, HLD.    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 Pt with impaired cognition as noted by scoring a 13 on the Short Blessed Tet of Memory and Concentration. Tremors noted during assessment. Both pt and wife state he "probably needs to go home and have a drink". Educated pt/wie on strategies to reduce risk of falls. VSS with exception of HR in the 130s after activity. No further OT needed.      Recommendations for follow up therapy are one component of a multi-disciplinary discharge planning process, led by the attending physician.  Recommendations may be updated based on patient status, additional functional criteria and insurance authorization.   Follow Up Recommendations  No OT follow up     Assistance Recommended at Discharge Frequent or constant Supervision/Assistance  Patient can return home with the following A lot of help with bathing/dressing/bathroom;Direct supervision/assist for medications management;Direct supervision/assist for financial management;Assist for transportation;Help with stairs or ramp for entrance;Assistance with cooking/housework    Functional Status Assessment  Patient has had a recent decline in their functional status and/or demonstrates limited ability to make significant improvements in function in a reasonable and predictable amount of time  Equipment  Recommendations  None recommended by OT    Recommendations for Other Services       Precautions / Restrictions Precautions Precautions: Fall Restrictions Weight Bearing Restrictions: No      Mobility Bed Mobility Overal bed mobility: Independent                  Transfers Overall transfer level: Modified independent                        Balance Overall balance assessment: Mild deficits observed, not formally tested                                         ADL either performed or assessed with clinical judgement   ADL Overall ADL's : Needs assistance/impaired                                     Functional mobility during ADLs: Supervision/safety General ADL Comments: overall set up/S; wife present adn recommended  use of shower chair to reduce risk of falls     Vision Baseline Vision/History: 1 Wears glasses Additional Comments: states he needs to go to an eye appointment     Perception     Praxis      Pertinent Vitals/Pain Pain Assessment Pain Assessment: No/denies pain     Hand Dominance Right   Extremity/Trunk Assessment Upper Extremity Assessment Upper Extremity Assessment: Overall WFL for tasks assessed   Lower Extremity Assessment Lower Extremity Assessment: Defer to PT evaluation   Cervical / Trunk Assessment Cervical / Trunk Assessment:  Normal   Communication Communication Communication: No difficulties   Cognition Arousal/Alertness: Awake/alert Behavior During Therapy: Impulsive, Restless Overall Cognitive Status: Impaired/Different from baseline Area of Impairment: Attention, Safety/judgement, Awareness, Problem solving                   Current Attention Level: Selective     Safety/Judgement: Decreased awareness of safety, Decreased awareness of deficits Awareness: Emergent Problem Solving: Slow processing General Comments: trying to brush teeth without toothpaste   Scored  a 13 on the Short Blessed Test 04- normal; 5-9 questional impairment; > 10 - impaired  General Comments       Exercises     Shoulder Instructions      Home Living Family/patient expects to be discharged to:: Private residence Living Arrangements: Spouse/significant other Available Help at Discharge: Available 24 hours/day Type of Home: Apartment Home Access: Stairs to enter [employer] of Steps: 4 Entrance Stairs-Rails: Right Home Layout: One level     Bathroom Shower/Tub: Teacher, early years/pre: Standard Bathroom Accessibility: Yes   Home Equipment: None   Additional Comments: works in custodial services at [employer]      Prior Functioning/Environment Prior Level of Function : Independent/Modified Independent;Driving                        OT Problem List: Impaired balance (sitting and/or standing);Decreased safety awareness;Decreased cognition      OT Treatment/Interventions:      OT Goals(Current goals can be found in the care plan section) Acute Rehab OT Goals Patient Stated Goal: to go home and get a drink OT Goal Formulation: All assessment and education complete, DC therapy  OT Frequency:      Co-evaluation              AM-PAC OT "6 Clicks" Daily Activity     Outcome Measure Help from another person eating meals?: None Help from another person taking care of personal grooming?: A Little Help from another person toileting, which includes using toliet, bedpan, or urinal?: A Little Help from another person bathing (including washing, rinsing, drying)?: A Little Help from another person to put on and taking off regular upper body clothing?: A Little Help from another person to put on and taking off regular lower body clothing?: A Little 6 Click Score: 19   End of Session Nurse Communication: Mobility status  Activity Tolerance: Patient tolerated treatment well Patient left: in chair;with call bell/phone  within reach;with family/visitor present  OT Visit Diagnosis: Other symptoms and signs involving cognitive function;Unsteadiness on feet (R26.81)                Time: 9417-4081 OT Time Calculation (min): 22 min Charges:  OT General Charges $OT Visit: 1 Visit OT Evaluation $OT Eval Low Complexity: Payne, OT/L   Acute OT Clinical Specialist Acute Rehabilitation Services Pager [phone] Office [phone]   [facility] 05/24/2022, 11:32 AM

## 2022-05-24 NOTE — Progress Notes (Addendum)
PROGRESS NOTE    Stephen Bowers  XBW:[id number] DOB: 1960/12/09 DOA: 05/21/2022 PCP: Ladell Pier, MD   Brief Narrative:  HPI: This is a 61 year old male with past medical history hypertension, CKD, dyslipidemia, alcohol use, peptic ulcer disease, thrombocytopenia and liver cirrhosis.  EMS called because the patient was confused, had nausea, vomiting and hematemesis.     On arrival to the ER he was febrile at 103 and COVID-positive.  Imaging showed pneumonia vs aspiration pneumonia.  Patient had lactic acidosis of 4.8, decreased to 2.6 after sepsis bundle IVF bolus.  Imaging also confirms liver cirrhosis.  Platelets 42 (patient has chronic thrombocytopenia).  In the ER patient received 1000 mg of Tylenol, Tmax increased to 104.2 oral.  Antibiotics given includes Rocephin, Flagyl and azithromycin.  Blood cultures x2 and urine culture collected.  80 mg IV Protonix also given.   Patient does not recall the events that brought him here.  Patient states he was simply dizzy.  He endorses drinking 3-4 shots of brandy and 3-4 bottles every weekend..  He states he does not get withdrawal symptoms.    Assessment & Plan:   Principal Problem:   Alcoholic gastritis with bleeding Active Problems:   HTN (hypertension)   Hyperlipidemia   Alcohol use disorder, mild, abuse   Seizure-like activity (HCC)   Thrombocytopenia (HCC)   Alcohol abuse   Anemia of chronic disease   Duodenal ulcer   Pneumonia due to COVID-19 virus   Aspiration pneumonia (HCC)   Lactic acid acidosis   Chronic liver failure (HCC)   Gout   Hematemesis with nausea   Alcoholic cirrhosis of liver without ascites (HCC)  Severe sepsis COVID-19 pneumonia/possible aspiration pneumonia: Meets criteria for severe sepsis based on fever, tachycardia, tachypnea and lactic acid of> 2. Chest x-ray unremarkable but follow-up CTA indicated bilateral patchy lung opacities with a confluent opacity in the right lower lobe consistent with  multifocal pneumonia.  Procalcitonin also elevated indicating superimposed bacterial pneumonia.  Last temperature spike was around 4 PM on 05/22/2022.  Clinically improving however he has significantly worsening leukocytosis which went up from 11 yesterday to 20 today.  Lactic acidosis resolved.  Procalcitonin improving.  Continue remdesivir.  No indication of dexamethasone.  Patient was encouraged to prone, out of bed to chair, to use incentive spirometry and flutter valve.  CTA negative for PE.  Continue Zosyn.  All cultures negative.  History of alcoholic gastritis/?  Hematemesis/anemia of chronic disease: Patient is a poor historian.  Unsure whether he had hematemesis or hemoptysis.  He cannot recall anything at all.  He says that he came in because of the dizziness.  Hemoglobin fairly stable.  GI has seen him, no plans for EGD.  Continue PPI.   Alcoholic liver cirrhosis/coagulopathy: INR slightly elevated at 1.1.  Patient noncompliant, continues to drink, last drink almost 2 days prior to admission.  Will avoid Tylenol.  Slightly elevated LFTs than his baseline, AST much more elevated, ALT is normal.  Indicative of alcoholic liver disease.  Monitor closely.  Hypokalemia: Low again, will replace.  Hypomagnesemia: Resolved.  Acute metabolic encephalopathy: Secondary to all of the above.  Improving.  Currently fully alert and oriented.  Chronic thrombocytopenia: Secondary to liver cirrhosis and now with sepsis and COVID.  Stable.  Essential hypertension: Controlled.  Continue Norvasc.  Agitation/delirium: Patient was noted to have significant agitation this morning requiring brief episode of lapbelt requirement.  He was given some Ativan to calm him down.  He was trying  to leave [place].  But he was too confused to have the capacity to sign that.  When I saw him, patient was fully alert and oriented.  He once again told me that he would like to go.  At that point in time, CBC was not  back and the plan was to discharge him home however CBC now shows significantly elevated leukocytosis so we have aborted the plan for discharge.  He is fully aware.  Patient does appear to have capacity to make decisions for himself.  At this time his wife is at the bedside and he is compliant with our recommendations however he remains at risk of leaving [place].  DVT prophylaxis: SCDs Start: 05/22/22 0435   Code Status: Full Code  Family Communication:  None present at bedside.  Plan of care discussed with patient in length and he/she verbalized understanding and agreed with it.  Status is: Inpatient Remains inpatient appropriate because: Patient improving but is still not medically ready for discharge.   Estimated body mass index is 27.62 kg/m as calculated from the following:   Height as of this encounter: '5\' 11"'$  (1.803 m).   Weight as of this encounter: 89.8 kg.    Nutritional Assessment: Body mass index is 27.62 kg/m.[name] Seen by dietician.  I agree with the assessment and plan as outlined below: Nutrition Status:        . Skin Assessment: I have examined the patient's skin and I agree with the wound assessment as performed by the wound care RN as outlined below:    Consultants:  GI  Procedures:  None  Antimicrobials:  Anti-infectives (From admission, onward)    Start     Dose/Rate Route Frequency Ordered Stop   05/23/22 1000  remdesivir 100 mg in sodium chloride 0.9 % 100 mL IVPB       See Hyperspace for full Linked Orders Report.   100 mg 200 mL/hr over 30 Minutes Intravenous Daily 05/22/22 0448 05/25/22 0959   05/22/22 2200  vancomycin (VANCOREADY) IVPB 1250 mg/250 mL  Status:  Discontinued        1,250 mg 166.7 mL/hr over 90 Minutes Intravenous Every 12 hours 05/22/22 0550 05/22/22 1124   05/22/22 1400  ceFEPIme (MAXIPIME) 2 g in sodium chloride 0.9 % 100 mL IVPB  Status:  Discontinued        2 g 200 mL/hr over 30 Minutes Intravenous Every 8  hours 05/22/22 0550 05/22/22 1125   05/22/22 1400  piperacillin-tazobactam (ZOSYN) IVPB 3.375 g        3.375 g 12.5 mL/hr over 240 Minutes Intravenous Every 8 hours 05/22/22 1137     05/22/22 0630  remdesivir 200 mg in sodium chloride 0.9% 250 mL IVPB       See Hyperspace for full Linked Orders Report.   200 mg 580 mL/hr over 30 Minutes Intravenous Once 05/22/22 0448 05/22/22 0651   05/22/22 0630  vancomycin (VANCOREADY) IVPB 1500 mg/300 mL        1,500 mg 150 mL/hr over 120 Minutes Intravenous  Once 05/22/22 0550 05/22/22 0935   05/22/22 0200  azithromycin (ZITHROMAX) 500 mg in sodium chloride 0.9 % 250 mL IVPB  Status:  Discontinued        500 mg 250 mL/hr over 60 Minutes Intravenous Every 24 hours 05/22/22 0159 05/22/22 0541   05/22/22 0015  cefTRIAXone (ROCEPHIN) 2 g in sodium chloride 0.9 % 100 mL IVPB        2 g  200 mL/hr over 30 Minutes Intravenous  Once 05/22/22 0000 05/22/22 0105   05/22/22 0015  metroNIDAZOLE (FLAGYL) IVPB 500 mg  Status:  Discontinued        500 mg 100 mL/hr over 60 Minutes Intravenous Every 12 hours 05/22/22 0000 05/22/22 0542         Subjective:  Patient seen and examined this morning.  He had no complaint.  He was on room air.  Objective: Vitals:   05/23/22 2314 05/24/22 0400 05/24/22 0744 05/24/22 1211  BP: 132/79 (!) 141/93 (!) 160/93 (!) 171/147  Pulse: 100 92 (!) 119 (!) 113  Resp: (!) 22 (!) 22  20  Temp: 98.7 F (37.1 C) 98.8 F (37.1 C) (!) 97.2 F (36.2 C) 98.8 F (37.1 C)  TempSrc: Oral Oral Axillary Axillary  SpO2:  98% 97% 94%  Weight:      Height:        Intake/Output Summary (Last 24 hours) at 05/24/2022 1324 Last data filed at 05/24/2022 1100 Gross per 24 hour  Intake 191.33 ml  Output --  Net 191.33 ml    Filed Weights   05/21/22 2346  Weight: 89.8 kg    Examination:  General exam: Appears calm and comfortable  Respiratory system: Rhonchi bilaterally. Respiratory effort normal. Cardiovascular system: S1 & S2  heard, RRR. No JVD, murmurs, rubs, gallops or clicks. No pedal edema. Gastrointestinal system: Abdomen is nondistended, soft and nontender. No organomegaly or masses felt. Normal bowel sounds heard. Central nervous system: Alert and oriented. No focal neurological deficits. Extremities: Symmetric 5 x 5 power. Skin: No rashes, lesions or ulcers.    Data Reviewed: I have personally reviewed following labs and imaging studies  CBC: Recent Labs  Lab 05/21/22 2348 05/22/22 0509 05/22/22 1826 05/23/22 0332 05/24/22 0554  WBC 9.3 11.3*  --   --  20.0*  NEUTROABS 8.1* 9.8*  --   --  17.1*  HGB 9.0* 8.2* 7.5* 7.9* 8.3*  HCT 28.3* 24.7* 23.1* 23.6* 24.3*  MCV 86.8 84.9  --   --  83.2  PLT 42* 39*  --   --  58*    Basic Metabolic Panel: Recent Labs  Lab 05/21/22 2348 05/22/22 0509 05/23/22 0332 05/24/22 0554  NA 137 139 134* 134*  K 3.7 3.4* 3.5 3.4*  CL 104 107 104 101  CO2 15* 17* 18* 17*  GLUCOSE 142* 137* 102* 92  BUN '7 7 6 11  '$ CREATININE 0.92 0.88 0.87 0.79  CALCIUM 8.2* 7.6* 7.9* 8.4*  MG  --  1.2* 2.0  --   PHOS  --  3.2  --   --     GFR: Estimated Creatinine Clearance: 105.9 mL/min (by C-G formula based on SCr of 0.79 mg/dL). Liver Function Tests: Recent Labs  Lab 05/21/22 2348 05/22/22 0509 05/23/22 0332 05/24/22 0554  AST 195* 191* 131* 110*  ALT 44 41 38 36  ALKPHOS 32* 27* 27* 35*  BILITOT 0.3 0.5 1.2 1.2  PROT 8.2* 7.4 7.0 7.4  ALBUMIN 3.7 3.2* 2.9* 3.2*    Recent Labs  Lab 05/21/22 2348  LIPASE 27    Recent Labs  Lab 05/22/22 0509  AMMONIA 28    Coagulation Profile: Recent Labs  Lab 05/21/22 2348 05/22/22 0509  INR 1.3* 1.3*    Cardiac Enzymes: No results for input(s): "CKTOTAL", "CKMB", "CKMBINDEX", "TROPONINI" in the last 168 hours. BNP (last 3 results) No results for input(s): "PROBNP" in the last 8760 hours. HbA1C: No results for input(s): "  HGBA1C" in the last 72 hours. CBG: Recent Labs  Lab 05/24/22 1205  GLUCAP 130*    Lipid Profile: Recent Labs    05/22/22 0509  TRIG 82    Thyroid Function Tests: No results for input(s): "TSH", "T4TOTAL", "FREET4", "T3FREE", "THYROIDAB" in the last 72 hours. Anemia Panel: Recent Labs    05/22/22 0509  FERRITIN 16*    Sepsis Labs: Recent Labs  Lab 05/22/22 0200 05/22/22 0944 05/22/22 1440 05/23/22 0821 05/24/22 1205  PROCALCITON  --  11.59  --   --  7.69  LATICACIDVEN 2.6* 4.7* 3.4* 1.4  --      Recent Results (from the past 240 hour(s))  Resp Panel by RT-PCR (Flu A&B, Covid)     Status: Abnormal   Collection Time: 05/21/22 12:02 AM   Specimen: Nasal Swab  Result Value Ref Range Status   SARS Coronavirus 2 by RT PCR POSITIVE (A) NEGATIVE Final    Comment: (NOTE) SARS-CoV-2 target nucleic acids are DETECTED.  The SARS-CoV-2 RNA is generally detectable in upper respiratory specimens during the acute phase of infection. Positive results are indicative of the presence of the identified virus, but do not rule out bacterial infection or co-infection with other pathogens not detected by the test. Clinical correlation with patient history and other diagnostic information is necessary to determine patient infection status. The expected result is Negative.  Fact Sheet for Patients: EntrepreneurPulse.com.au  Fact Sheet for Healthcare Providers: IncredibleEmployment.be  This test is not yet approved or cleared by the [place] FDA and  has been authorized for detection and/or diagnosis of SARS-CoV-2 by FDA under an Emergency Use Authorization (EUA).  This EUA will remain in effect (meaning this test can be used) for the duration of  the COVID-19 declaration under Section 564(b)(1) of the A ct, 21 U.S.C. section 360bbb-3(b)(1), unless the authorization is terminated or revoked sooner.     Influenza A by PCR NEGATIVE NEGATIVE Final   Influenza B by PCR NEGATIVE NEGATIVE Final    Comment: (NOTE) The Xpert  Xpress SARS-CoV-2/FLU/RSV plus assay is intended as an aid in the diagnosis of influenza from Nasopharyngeal swab specimens and should not be used as a sole basis for treatment. Nasal washings and aspirates are unacceptable for Xpert Xpress SARS-CoV-2/FLU/RSV testing.  Fact Sheet for Patients: EntrepreneurPulse.com.au  Fact Sheet for Healthcare Providers: IncredibleEmployment.be  This test is not yet approved or cleared by the [place] FDA and has been authorized for detection and/or diagnosis of SARS-CoV-2 by FDA under an Emergency Use Authorization (EUA). This EUA will remain in effect (meaning this test can be used) for the duration of the COVID-19 declaration under Section 564(b)(1) of the Act, 21 U.S.C. section 360bbb-3(b)(1), unless the authorization is terminated or revoked.  Performed at [facility], [place] [address], [place], [place] [address]   Blood culture (routine x 2)     Status: None (Preliminary result)   Collection Time: 05/21/22 11:56 PM   Specimen: BLOOD  Result Value Ref Range Status   Specimen Description BLOOD SITE NOT SPECIFIED  Final   Special Requests   Final    BOTTLES DRAWN AEROBIC AND ANAEROBIC Blood Culture results may not be optimal due to an excessive volume of blood received in culture bottles   Culture   Final    NO GROWTH 2 DAYS Performed at [facility], [place] [address], [place], [place] [address]    Report Status PENDING  Incomplete  Blood culture (routine x 2)  Status: None (Preliminary result)   Collection Time: 05/21/22 11:56 PM   Specimen: BLOOD  Result Value Ref Range Status   Specimen Description BLOOD SITE NOT SPECIFIED  Final   Special Requests   Final    BOTTLES DRAWN AEROBIC AND ANAEROBIC Blood Culture results may not be optimal due to an inadequate volume of blood received in culture bottles   Culture   Final    NO GROWTH 2 DAYS Performed at [facility], [place]  [address], [place], [place] [address]    Report Status PENDING  Incomplete  Urine Culture     Status: None   Collection Time: 05/22/22  4:49 AM   Specimen: Urine, Clean Catch  Result Value Ref Range Status   Specimen Description URINE, CLEAN CATCH  Final   Special Requests NONE  Final   Culture   Final    NO GROWTH Performed at [facility], [place] [address], [place], [place] [address]    Report Status 05/23/2022 FINAL  Final  MRSA Next Gen by PCR, Nasal     Status: None   Collection Time: 05/22/22  7:33 AM   Specimen: Nasal Mucosa; Nasal Swab  Result Value Ref Range Status   MRSA by PCR Next Gen NOT DETECTED NOT DETECTED Final    Comment: (NOTE) The GeneXpert MRSA Assay (FDA approved for NASAL specimens only), is one component of a comprehensive MRSA colonization surveillance program. It is not intended to diagnose MRSA infection nor to guide or monitor treatment for MRSA infections. Test performance is not FDA approved in patients less than 61 years old. Performed at [facility], [place] [address], [place], [place] [address]      Radiology Studies: No results found.  Scheduled Meds:  amLODipine  10 mg Oral Daily   folic acid  1 mg Oral Daily   multivitamin with minerals  1 tablet Oral Daily   pantoprazole  40 mg Oral BID   thiamine  100 mg Oral Daily   Or   thiamine  100 mg Intravenous Daily   Continuous Infusions:  sodium chloride 100 mL/hr (05/22/22 1140)   piperacillin-tazobactam (ZOSYN)  IV 3.375 g (05/23/22 2125)   remdesivir 100 mg in sodium chloride 0.9 % 100 mL IVPB 100 mg (05/24/22 1302)     LOS: 2 days   Darliss Cheney, MD [facility]  05/24/2022, 1:24 PM   *Please note that this is a verbal dictation therefore any spelling or grammatical errors are due to the "[place] [facility]" system interpretation.  Please page via [place] and do not message via secure chat for urgent patient care matters. Secure chat can be used for non urgent patient care  matters.  How to contact the [facility] Attending or Consulting provider [place] or covering provider during after hours [place], for this patient?  Check the care team in [facility] and look for a) attending/consulting [facility] provider listed and b) the [facility] team listed. Page or secure chat [phone]. Log into [email or web address] and use [place]'s universal password to access. If you do not have the password, please contact the hospital operator. Locate the [facility] provider you are looking for under [facility] and page to a number that you can be directly reached. If you still have difficulty reaching the provider, please page the [facility] (Director on Call) for the Hospitalists listed on amion for assistance.

## 2022-05-24 NOTE — Progress Notes (Signed)
Patient pulled out PIV, incontinent of stool and trying to leave.  MD aware.

## 2022-05-25 LAB — BASIC METABOLIC PANEL
Anion gap: 17 — ABNORMAL HIGH (ref 5–15)
BUN: 8 mg/dL (ref 6–20)
CO2: 18 mmol/L — ABNORMAL LOW (ref 22–32)
Calcium: 9 mg/dL (ref 8.9–10.3)
Chloride: 99 mmol/L (ref 98–111)
Creatinine, Ser: 0.76 mg/dL (ref 0.61–1.24)
GFR, Estimated: >60 mL/min (ref >60)
Glucose, Bld: 102 mg/dL — ABNORMAL HIGH (ref 70–99)
Potassium: 3.8 mmol/L (ref 3.5–5.1)
Sodium: 134 mmol/L — ABNORMAL LOW (ref 135–145)

## 2022-05-25 LAB — CBC WITH DIFFERENTIAL/PLATELET
Abs Immature Granulocytes: 0.35 10*3/uL — ABNORMAL HIGH (ref 0.00–0.07)
Basophils Absolute: 0.1 10*3/uL (ref 0.0–0.1)
Basophils Relative: 1 %
Eosinophils Absolute: 0.1 10*3/uL (ref 0.0–0.5)
Eosinophils Relative: 0 %
HCT: 25.4 % — ABNORMAL LOW (ref 39.0–52.0)
Hemoglobin: 8.3 g/dL — ABNORMAL LOW (ref 13.0–17.0)
Immature Granulocytes: 2 %
Lymphocytes Relative: 10 %
Lymphs Abs: 1.8 10*3/uL (ref 0.7–4.0)
MCH: 27.5 pg (ref 26.0–34.0)
MCHC: 32.7 g/dL (ref 30.0–36.0)
MCV: 84.1 fL (ref 80.0–100.0)
Monocytes Absolute: 1.6 10*3/uL — ABNORMAL HIGH (ref 0.1–1.0)
Monocytes Relative: 9 %
Neutro Abs: 13.4 10*3/uL — ABNORMAL HIGH (ref 1.7–7.7)
Neutrophils Relative %: 78 %
Platelets: 79 10*3/uL — ABNORMAL LOW (ref 150–400)
RBC: 3.02 MIL/uL — ABNORMAL LOW (ref 4.22–5.81)
RDW: 16.4 % — ABNORMAL HIGH (ref 11.5–15.5)
WBC: 17.3 10*3/uL — ABNORMAL HIGH (ref 4.0–10.5)
nRBC: 0.2 % (ref 0.0–0.2)

## 2022-05-25 MED ORDER — LORAZEPAM 1 MG PO TABS
1.0000 mg | ORAL_TABLET | ORAL | Status: AC | PRN
  Administered 2022-05-27 (×2): 2 mg via ORAL
  Filled 2022-05-25 (×2): qty 2

## 2022-05-25 MED ORDER — LORAZEPAM 2 MG/ML IJ SOLN
1.0000 mg | INTRAMUSCULAR | Status: AC | PRN
  Administered 2022-05-25: 2 mg via INTRAVENOUS
  Administered 2022-05-25 (×2): 3 mg via INTRAVENOUS
  Administered 2022-05-25 – 2022-05-27 (×7): 2 mg via INTRAVENOUS
  Administered 2022-05-27: 3 mg via INTRAVENOUS
  Administered 2022-05-28: 2 mg via INTRAVENOUS
  Filled 2022-05-25 (×2): qty 1
  Filled 2022-05-25: qty 2
  Filled 2022-05-25 (×5): qty 1
  Filled 2022-05-25: qty 2
  Filled 2022-05-25: qty 1
  Filled 2022-05-25: qty 2
  Filled 2022-05-25: qty 1

## 2022-05-25 NOTE — Progress Notes (Signed)
Pharmacy Antibiotic Note  Stephen Bowers is a 61 y.o. male admitted on 05/21/2022 with pneumonia.  Pharmacy has been consulted to dose Zosyn.    Patient afebrile with wbc 17.3 trending down. LA decreased to 1.4. Procalcitonin today 7.69. On remdesivir for COVID.   Plan: Continue zosyn 3.375g IV every 8 hours (extended 4h infusion); consider de-escalation if able  Monitor renal function, culture results, clinical status and resolution of s/sx infection Narrow abx as able and f/u with appropriate duration  Height: '5\' 11"'$  (180.3 cm) Weight: 89.8 kg (198 lb) IBW/kg (Calculated) : 75.3  Temp (24hrs), Avg:98.5 F (36.9 C), Min:98 F (36.7 C), Max:98.8 F (37.1 C)  Recent Labs  Lab 05/21/22 2348 05/22/22 0200 05/22/22 0509 05/22/22 0944 05/22/22 1440 05/23/22 0332 05/23/22 0821 05/24/22 0554 05/25/22 0157  WBC 9.3  --  11.3*  --   --   --   --  20.0* 17.3*  CREATININE 0.92  --  0.88  --   --  0.87  --  0.79 0.76  LATICACIDVEN 4.9* 2.6*  --  4.7* 3.4*  --  1.4  --   --      Estimated Creatinine Clearance: 105.9 mL/min (by C-G formula based on SCr of 0.76 mg/dL).    No Known Allergies  Culture Results 11/19 Bcx: NGTD x2 11/20 Ucx: NG final  11/20 MRSA PCR: negative 11/19 resp panel: COVID positive    Gena Fray, PharmD PGY1 Pharmacy Resident   05/25/2022 9:38 AM

## 2022-05-25 NOTE — Progress Notes (Signed)
PROGRESS NOTE    Stephen Bowers  VVO:[id number] DOB: 11-28-1960 DOA: 05/21/2022 PCP: Ladell Pier, MD   Brief Narrative:  HPI: This is a 61 year old male with past medical history hypertension, CKD, dyslipidemia, alcohol use, peptic ulcer disease, thrombocytopenia and liver cirrhosis.  EMS called because the patient was confused, had nausea, vomiting and hematemesis.     On arrival to the ER he was febrile at 103 and COVID-positive.  Imaging showed pneumonia vs aspiration pneumonia.  Patient had lactic acidosis of 4.8, decreased to 2.6 after sepsis bundle IVF bolus.  Imaging also confirms liver cirrhosis.  Platelets 42 (patient has chronic thrombocytopenia).  In the ER patient received 1000 mg of Tylenol, Tmax increased to 104.2 oral.  Antibiotics given includes Rocephin, Flagyl and azithromycin.  Blood cultures x2 and urine culture collected.  80 mg IV Protonix also given.   Patient does not recall the events that brought him here.  Patient states he was simply dizzy.  He endorses drinking 3-4 shots of brandy and 3-4 bottles every weekend..  He states he does not get withdrawal symptoms.    Assessment & Plan:   Principal Problem:   Alcoholic gastritis with bleeding Active Problems:   HTN (hypertension)   Hyperlipidemia   Alcohol use disorder, mild, abuse   Seizure-like activity (HCC)   Thrombocytopenia (HCC)   Alcohol abuse   Anemia of chronic disease   Duodenal ulcer   Pneumonia due to COVID-19 virus   Aspiration pneumonia (HCC)   Lactic acid acidosis   Chronic liver failure (HCC)   Gout   Hematemesis with nausea   Alcoholic cirrhosis of liver without ascites (HCC)  Severe sepsis COVID-19 pneumonia/possible aspiration pneumonia: Meets criteria for severe sepsis based on fever, tachycardia, tachypnea and lactic acid of> 2. Chest x-ray unremarkable but follow-up CTA indicated bilateral patchy lung opacities with a confluent opacity in the right lower lobe consistent with  multifocal pneumonia.  Procalcitonin also elevated indicating superimposed bacterial pneumonia.  Last temperature spike was around 4 PM on 05/22/2022.  Clinically improving however he has significantly worsening leukocytosis which went up from 11 yesterday to 20 today.  Lactic acidosis resolved.  Procalcitonin improving.  Continue remdesivir.  No indication of dexamethasone.  Patient was encouraged to prone, out of bed to chair, to use incentive spirometry and flutter valve.  CTA negative for PE.  Continue Zosyn.  All cultures negative.  History of alcoholic gastritis/?  Hematemesis/anemia of chronic disease: Patient is a poor historian.  Unsure whether he had hematemesis or hemoptysis.  He cannot recall anything at all.  He says that he came in because of the dizziness.  Hemoglobin fairly stable.  GI has seen him, no plans for EGD.  Continue PPI.   Alcoholic liver cirrhosis/coagulopathy: INR slightly elevated at 1.1.  Patient noncompliant, continues to drink, last drink almost 2 days prior to admission.  Will avoid Tylenol.  Slightly elevated LFTs than his baseline, AST much more elevated, ALT is normal.  Indicative of alcoholic liver disease.  Monitor closely.  Alcohol withdrawal: Starting afternoon of 05/24/2022, patient has started having alcohol withdrawal, he was too agitated to be calm down with couple of doses of Haldol as well as Ativan and subsequently he needed to be placed in soft restraints as he was unsteady and was trying to leave.  He is still withdrawing, continue CIWA protocol.  Continue soft restraints for patient safety.  Hypokalemia: Resolved.  Hypomagnesemia: Resolved.  Acute metabolic encephalopathy: Now confused due to alcohol  withdrawal.  Chronic thrombocytopenia: Secondary to liver cirrhosis and now with sepsis and COVID.  Stable.  Essential hypertension: Controlled.  Continue Norvasc.  DVT prophylaxis: SCDs Start: 05/22/22 0435   Code Status: Full Code  Family  Communication:  None present at bedside.  Status is: Inpatient Remains inpatient appropriate because: Family withdrawing from alcohol.   Estimated body mass index is 27.62 kg/m as calculated from the following:   Height as of this encounter: '5\' 11"'$  (1.803 m).   Weight as of this encounter: 89.8 kg.    Nutritional Assessment: Body mass index is 27.62 kg/m.[name] Seen by dietician.  I agree with the assessment and plan as outlined below: Nutrition Status:        . Skin Assessment: I have examined the patient's skin and I agree with the wound assessment as performed by the wound care RN as outlined below:    Consultants:  GI  Procedures:  None  Antimicrobials:  Anti-infectives (From admission, onward)    Start     Dose/Rate Route Frequency Ordered Stop   05/23/22 1000  remdesivir 100 mg in sodium chloride 0.9 % 100 mL IVPB       See Hyperspace for full Linked Orders Report.   100 mg 200 mL/hr over 30 Minutes Intravenous Daily 05/22/22 0448 05/24/22 1535   05/22/22 2200  vancomycin (VANCOREADY) IVPB 1250 mg/250 mL  Status:  Discontinued        1,250 mg 166.7 mL/hr over 90 Minutes Intravenous Every 12 hours 05/22/22 0550 05/22/22 1124   05/22/22 1400  ceFEPIme (MAXIPIME) 2 g in sodium chloride 0.9 % 100 mL IVPB  Status:  Discontinued        2 g 200 mL/hr over 30 Minutes Intravenous Every 8 hours 05/22/22 0550 05/22/22 1125   05/22/22 1400  piperacillin-tazobactam (ZOSYN) IVPB 3.375 g        3.375 g 12.5 mL/hr over 240 Minutes Intravenous Every 8 hours 05/22/22 1137     05/22/22 0630  remdesivir 200 mg in sodium chloride 0.9% 250 mL IVPB       See Hyperspace for full Linked Orders Report.   200 mg 580 mL/hr over 30 Minutes Intravenous Once 05/22/22 0448 05/22/22 0651   05/22/22 0630  vancomycin (VANCOREADY) IVPB 1500 mg/300 mL        1,500 mg 150 mL/hr over 120 Minutes Intravenous  Once 05/22/22 0550 05/22/22 0935   05/22/22 0200  azithromycin (ZITHROMAX) 500 mg in sodium  chloride 0.9 % 250 mL IVPB  Status:  Discontinued        500 mg 250 mL/hr over 60 Minutes Intravenous Every 24 hours 05/22/22 0159 05/22/22 0541   05/22/22 0015  cefTRIAXone (ROCEPHIN) 2 g in sodium chloride 0.9 % 100 mL IVPB        2 g 200 mL/hr over 30 Minutes Intravenous  Once 05/22/22 0000 05/22/22 0105   05/22/22 0015  metroNIDAZOLE (FLAGYL) IVPB 500 mg  Status:  Discontinued        500 mg 100 mL/hr over 60 Minutes Intravenous Every 12 hours 05/22/22 0000 05/22/22 0542         Subjective:  Patient seen and examined.  He was lethargic and confused.  Objective: Vitals:   05/25/22 0814 05/25/22 0848 05/25/22 1216 05/25/22 1219  BP:  (!) 143/86 (!) 137/97 (!) 137/97  Pulse: 97 98 (!) 104 (!) 108  Resp: 20 18 (!) 42 20  Temp:  98.8 F (37.1 C) 99.3 F (37.4 C)  TempSrc:  Oral Axillary   SpO2: 99% 97% 97% 97%  Weight:      Height:        Intake/Output Summary (Last 24 hours) at 05/25/2022 1330 Last data filed at 05/25/2022 1319 Gross per 24 hour  Intake 2327.01 ml  Output 1400 ml  Net 927.01 ml    Filed Weights   05/21/22 2346  Weight: 89.8 kg    Examination:  General exam: Appears toxic and confused. Respiratory system: Clear to auscultation. Respiratory effort normal. Cardiovascular system: S1 & S2 heard, RRR. No JVD, murmurs, rubs, gallops or clicks. No pedal edema. Gastrointestinal system: Abdomen is nondistended, soft and nontender. No organomegaly or masses felt. Normal bowel sounds heard. Central nervous system: Again confused but no focal deficit. Extremities: Symmetric 5 x 5 power. Skin: No rashes, lesions or ulcers.     Data Reviewed: I have personally reviewed following labs and imaging studies  CBC: Recent Labs  Lab 05/21/22 2348 05/22/22 0509 05/22/22 1826 05/23/22 0332 05/24/22 0554 05/25/22 0157  WBC 9.3 11.3*  --   --  20.0* 17.3*  NEUTROABS 8.1* 9.8*  --   --  17.1* 13.4*  HGB 9.0* 8.2* 7.5* 7.9* 8.3* 8.3*  HCT 28.3* 24.7* 23.1*  23.6* 24.3* 25.4*  MCV 86.8 84.9  --   --  83.2 84.1  PLT 42* 39*  --   --  58* 79*    Basic Metabolic Panel: Recent Labs  Lab 05/21/22 2348 05/22/22 0509 05/23/22 0332 05/24/22 0554 05/25/22 0157  NA 137 139 134* 134* 134*  K 3.7 3.4* 3.5 3.4* 3.8  CL 104 107 104 101 99  CO2 15* 17* 18* 17* 18*  GLUCOSE 142* 137* 102* 92 102*  BUN '7 7 6 11 8  '$ CREATININE 0.92 0.88 0.87 0.79 0.76  CALCIUM 8.2* 7.6* 7.9* 8.4* 9.0  MG  --  1.2* 2.0  --   --   PHOS  --  3.2  --   --   --     GFR: Estimated Creatinine Clearance: 105.9 mL/min (by C-G formula based on SCr of 0.76 mg/dL). Liver Function Tests: Recent Labs  Lab 05/21/22 2348 05/22/22 0509 05/23/22 0332 05/24/22 0554  AST 195* 191* 131* 110*  ALT 44 41 38 36  ALKPHOS 32* 27* 27* 35*  BILITOT 0.3 0.5 1.2 1.2  PROT 8.2* 7.4 7.0 7.4  ALBUMIN 3.7 3.2* 2.9* 3.2*    Recent Labs  Lab 05/21/22 2348  LIPASE 27    Recent Labs  Lab 05/22/22 0509  AMMONIA 28    Coagulation Profile: Recent Labs  Lab 05/21/22 2348 05/22/22 0509  INR 1.3* 1.3*    Cardiac Enzymes: No results for input(s): "CKTOTAL", "CKMB", "CKMBINDEX", "TROPONINI" in the last 168 hours. BNP (last 3 results) No results for input(s): "PROBNP" in the last 8760 hours. HbA1C: No results for input(s): "HGBA1C" in the last 72 hours. CBG: Recent Labs  Lab 05/24/22 1205  GLUCAP 130*    Lipid Profile: No results for input(s): "CHOL", "HDL", "LDLCALC", "TRIG", "CHOLHDL", "LDLDIRECT" in the last 72 hours.  Thyroid Function Tests: No results for input(s): "TSH", "T4TOTAL", "FREET4", "T3FREE", "THYROIDAB" in the last 72 hours. Anemia Panel: No results for input(s): "VITAMINB12", "FOLATE", "FERRITIN", "TIBC", "IRON", "RETICCTPCT" in the last 72 hours.  Sepsis Labs: Recent Labs  Lab 05/22/22 0200 05/22/22 0944 05/22/22 1440 05/23/22 0821 05/24/22 1205  PROCALCITON  --  11.59  --   --  7.69  LATICACIDVEN 2.6* 4.7* 3.4* 1.4  --  Recent Results  (from the past 240 hour(s))  Resp Panel by RT-PCR (Flu A&B, Covid)     Status: Abnormal   Collection Time: 05/21/22 12:02 AM   Specimen: Nasal Swab  Result Value Ref Range Status   SARS Coronavirus 2 by RT PCR POSITIVE (A) NEGATIVE Final    Comment: (NOTE) SARS-CoV-2 target nucleic acids are DETECTED.  The SARS-CoV-2 RNA is generally detectable in upper respiratory specimens during the acute phase of infection. Positive results are indicative of the presence of the identified virus, but do not rule out bacterial infection or co-infection with other pathogens not detected by the test. Clinical correlation with patient history and other diagnostic information is necessary to determine patient infection status. The expected result is Negative.  Fact Sheet for Patients: EntrepreneurPulse.com.au  Fact Sheet for Healthcare Providers: IncredibleEmployment.be  This test is not yet approved or cleared by the [place] FDA and  has been authorized for detection and/or diagnosis of SARS-CoV-2 by FDA under an Emergency Use Authorization (EUA).  This EUA will remain in effect (meaning this test can be used) for the duration of  the COVID-19 declaration under Section 564(b)(1) of the A ct, 21 U.S.C. section 360bbb-3(b)(1), unless the authorization is terminated or revoked sooner.     Influenza A by PCR NEGATIVE NEGATIVE Final   Influenza B by PCR NEGATIVE NEGATIVE Final    Comment: (NOTE) The Xpert Xpress SARS-CoV-2/FLU/RSV plus assay is intended as an aid in the diagnosis of influenza from Nasopharyngeal swab specimens and should not be used as a sole basis for treatment. Nasal washings and aspirates are unacceptable for Xpert Xpress SARS-CoV-2/FLU/RSV testing.  Fact Sheet for Patients: EntrepreneurPulse.com.au  Fact Sheet for Healthcare Providers: IncredibleEmployment.be  This test is not yet approved or  cleared by the [place] FDA and has been authorized for detection and/or diagnosis of SARS-CoV-2 by FDA under an Emergency Use Authorization (EUA). This EUA will remain in effect (meaning this test can be used) for the duration of the COVID-19 declaration under Section 564(b)(1) of the Act, 21 U.S.C. section 360bbb-3(b)(1), unless the authorization is terminated or revoked.  Performed at [facility], [place] [address], [place], [place] [address]   Blood culture (routine x 2)     Status: None (Preliminary result)   Collection Time: 05/21/22 11:56 PM   Specimen: BLOOD  Result Value Ref Range Status   Specimen Description BLOOD SITE NOT SPECIFIED  Final   Special Requests   Final    BOTTLES DRAWN AEROBIC AND ANAEROBIC Blood Culture results may not be optimal due to an excessive volume of blood received in culture bottles   Culture   Final    NO GROWTH 2 DAYS Performed at [facility], [place] [address] [place], [place] [address]    Report Status PENDING  Incomplete  Blood culture (routine x 2)     Status: None (Preliminary result)   Collection Time: 05/21/22 11:56 PM   Specimen: BLOOD  Result Value Ref Range Status   Specimen Description BLOOD SITE NOT SPECIFIED  Final   Special Requests   Final    BOTTLES DRAWN AEROBIC AND ANAEROBIC Blood Culture results may not be optimal due to an inadequate volume of blood received in culture bottles   Culture   Final    NO GROWTH 2 DAYS Performed at [facility], [place] [address], [place], [place] [address]    Report Status PENDING  Incomplete  Urine Culture  Status: None   Collection Time: 05/22/22  4:49 AM   Specimen: Urine, Clean Catch  Result Value Ref Range Status   Specimen Description URINE, CLEAN CATCH  Final   Special Requests NONE  Final   Culture   Final    NO GROWTH Performed at [facility], [address] [place], [place] [address]    Report Status 05/23/2022 FINAL  Final  MRSA Next Gen by PCR,  Nasal     Status: None   Collection Time: 05/22/22  7:33 AM   Specimen: Nasal Mucosa; Nasal Swab  Result Value Ref Range Status   MRSA by PCR Next Gen NOT DETECTED NOT DETECTED Final    Comment: (NOTE) The GeneXpert MRSA Assay (FDA approved for NASAL specimens only), is one component of a comprehensive MRSA colonization surveillance program. It is not intended to diagnose MRSA infection nor to guide or monitor treatment for MRSA infections. Test performance is not FDA approved in patients less than 36 years old. Performed at [facility], [place] [address] [place], [place] [address]      Radiology Studies: No results found.  Scheduled Meds:  amLODipine  10 mg Oral Daily   folic acid  1 mg Oral Daily   multivitamin with minerals  1 tablet Oral Daily   pantoprazole  40 mg Oral BID   thiamine  100 mg Oral Daily   Or   thiamine  100 mg Intravenous Daily   Continuous Infusions:  sodium chloride Stopped (05/25/22 1300)   piperacillin-tazobactam (ZOSYN)  IV 12.5 mL/hr at 05/25/22 1319     LOS: 3 days   Darliss Cheney, MD [facility]  05/25/2022, 1:30 PM   *Please note that this is a verbal dictation therefore any spelling or grammatical errors are due to the "[place] One" system interpretation.  Please page via [place] and do not message via secure chat for urgent patient care matters. Secure chat can be used for non urgent patient care matters.  How to contact the [facility] Attending or Consulting provider [place] or covering provider during after hours [place], for this patient?  Check the care team in [facility] and look for a) attending/consulting TRH provider listed and b) the [employer] team listed. Page or secure chat [phone]. Log into [email or web address] and use 's universal password to access. If you do not have the password, please contact the hospital operator. Locate the [facility] provider you are looking for under [facility] and page to a number that you can be  directly reached. If you still have difficulty reaching the provider, please page the [facility] (Director on Call) for the Hospitalists listed on amion for assistance.

## 2022-05-25 NOTE — Progress Notes (Signed)
   05/25/22 1954  Assess: MEWS Score  Temp (!) 101.5 F (38.6 C)  BP (!) 147/90  MAP (mmHg) 108  Pulse Rate (!) 114  ECG Heart Rate (!) 115  Resp (!) 25  Level of Consciousness Alert  SpO2 98 %  O2 Device Nasal Cannula  Patient Activity (if Appropriate) In bed  O2 Flow Rate (L/min) 3 L/min  Assess: MEWS Score  MEWS Temp 2  MEWS Systolic 0  MEWS Pulse 2  MEWS RR 1  MEWS LOC 0  MEWS Score 5  MEWS Score Color Red  Assess: if the MEWS score is Yellow or Red  Were vital signs taken at a resting state? Yes  Focused Assessment Change from prior assessment (see assessment flowsheet)  Does the patient meet 2 or more of the SIRS criteria? Yes  Does the patient have a confirmed or suspected source of infection? Yes  Provider and Rapid Response Notified? Yes  MEWS guidelines implemented *See Row Information* No, other (Comment)  Treat  MEWS Interventions Administered prn meds/treatments  Pain Scale 0-10  Pain Score 0  Escalate  MEWS: Escalate Red: discuss with charge nurse/RN and provider, consider discussing with RRT  Provider Notification  Provider Name/Title Dr.C.[place] Crane  Date Provider Notified 05/25/22  Time Provider Notified 2005  Method of Notification  (Secure chat)  Notification Reason Other (Comment) (Red MEWS and high TEMP.)  Provider response No new orders  Assess: SIRS CRITERIA  SIRS Temperature  1  SIRS Pulse 1  SIRS Respirations  1  SIRS WBC 1  SIRS Score Sum  4

## 2022-05-26 DIAGNOSIS — G9341 Metabolic encephalopathy: Secondary | ICD-10-CM

## 2022-05-26 LAB — COMPREHENSIVE METABOLIC PANEL
ALT: 35 U/L (ref 0–44)
AST: 83 U/L — ABNORMAL HIGH (ref 15–41)
Albumin: 3.1 g/dL — ABNORMAL LOW (ref 3.5–5.0)
Alkaline Phosphatase: 38 U/L (ref 38–126)
Anion gap: 18 — ABNORMAL HIGH (ref 5–15)
BUN: 11 mg/dL (ref 6–20)
CO2: 19 mmol/L — ABNORMAL LOW (ref 22–32)
Calcium: 9 mg/dL (ref 8.9–10.3)
Chloride: 98 mmol/L (ref 98–111)
Creatinine, Ser: 0.87 mg/dL (ref 0.61–1.24)
GFR, Estimated: >60 mL/min (ref >60)
Glucose, Bld: 124 mg/dL — ABNORMAL HIGH (ref 70–99)
Potassium: 3.5 mmol/L (ref 3.5–5.1)
Sodium: 135 mmol/L (ref 135–145)
Total Bilirubin: 1.3 mg/dL — ABNORMAL HIGH (ref 0.3–1.2)
Total Protein: 7.8 g/dL (ref 6.5–8.1)

## 2022-05-26 LAB — CBC WITH DIFFERENTIAL/PLATELET
Abs Immature Granulocytes: 0 10*3/uL (ref 0.00–0.07)
Basophils Absolute: 0 10*3/uL (ref 0.0–0.1)
Basophils Relative: 0 %
Eosinophils Absolute: 0 10*3/uL (ref 0.0–0.5)
Eosinophils Relative: 0 %
HCT: 27 % — ABNORMAL LOW (ref 39.0–52.0)
Hemoglobin: 8.7 g/dL — ABNORMAL LOW (ref 13.0–17.0)
Lymphocytes Relative: 11 %
Lymphs Abs: 2 10*3/uL (ref 0.7–4.0)
MCH: 27.4 pg (ref 26.0–34.0)
MCHC: 32.2 g/dL (ref 30.0–36.0)
MCV: 84.9 fL (ref 80.0–100.0)
Monocytes Absolute: 1.7 10*3/uL — ABNORMAL HIGH (ref 0.1–1.0)
Monocytes Relative: 9 %
Neutro Abs: 14.8 10*3/uL — ABNORMAL HIGH (ref 1.7–7.7)
Neutrophils Relative %: 80 %
Platelets: 127 10*3/uL — ABNORMAL LOW (ref 150–400)
RBC: 3.18 MIL/uL — ABNORMAL LOW (ref 4.22–5.81)
RDW: 16.7 % — ABNORMAL HIGH (ref 11.5–15.5)
WBC: 18.5 10*3/uL — ABNORMAL HIGH (ref 4.0–10.5)
nRBC: 0 % (ref 0.0–0.2)
nRBC: 0 /100 WBC

## 2022-05-26 LAB — PROCALCITONIN: Procalcitonin: 2.08 ng/mL

## 2022-05-26 NOTE — Progress Notes (Addendum)
Physical Therapy Treatment Patient Details Name: Stephen Bowers MRN: [id number] DOB: [birth year] Today's Date: 05/26/2022   History of Present Illness 61 y.o. male presents to [facility] hospital on 05/21/2022 with AMS, nausea, vomiting, hematemesis. Pt also found to be COVID+. PMH significant for alcohol use disorder, pancreatitis, HTN, CKD, HLD.    PT Comments    Significant decline in functional status from evaluation on 11/22. Patient was ambulatory at supervision level, mod I with bed mobility at that time, slightly altered cognitive status. Today required max assist to stand, showing heavy posterior lean, increased confusion, Mod assist for simple bed mobility. HR into 130s with all mobility. Recommendation for SNF, goals downgraded due to decline in status. Patient will continue to benefit from skilled physical therapy services to further improve independence with functional mobility.    Recommendations for follow up therapy are one component of a multi-disciplinary discharge planning process, led by the attending physician.  Recommendations may be updated based on patient status, additional functional criteria and insurance authorization.  Follow Up Recommendations  Skilled nursing-short term rehab (<3 hours/day)     Assistance Recommended at Discharge Frequent or constant Supervision/Assistance  Patient can return home with the following Assistance with cooking/housework;Direct supervision/assist for medications management;Direct supervision/assist for financial management;Assist for transportation;Two people to help with walking and/or transfers;Two people to help with bathing/dressing/bathroom;Help with stairs or ramp for entrance   Equipment Recommendations  None recommended by PT    Recommendations for Other Services       Precautions / Restrictions Precautions Precautions: Fall Restrictions Weight Bearing Restrictions: No     Mobility  Bed Mobility Overal bed mobility:  Needs Assistance Bed Mobility: Supine to Sit, Sit to Supine     Supine to sit: Mod assist, HOB elevated Sit to supine: Mod assist   General bed mobility comments: Mod assist for trunk support and LE support in and out of bed, heavy posterior lean.    Transfers Overall transfer level: Needs assistance Equipment used: Rolling walker (2 wheels) Transfers: Sit to/from Stand Sit to Stand: Max assist, From elevated surface           General transfer comment: Max assist for boost and balance, showing heavy posterior lean and LOB with max assist required to stabilize, cues throughout to facilitate movement.    Ambulation/Gait               General Gait Details: Not safe at this time   Stairs             Wheelchair Mobility    Modified Rankin (Stroke Patients Only)       Balance Overall balance assessment: Needs assistance Sitting-balance support: Feet supported, Bilateral upper extremity supported Sitting balance-Leahy Scale: Poor Sitting balance - Comments: Tolerated short periods of unsupported sitting but became worse with time.   Standing balance support: Reliant on assistive device for balance Standing balance-Leahy Scale: Zero                              Cognition Arousal/Alertness: Awake/alert Behavior During Therapy: Restless Overall Cognitive Status: Impaired/Different from baseline Area of Impairment: Attention, Safety/judgement, Awareness, Problem solving, Orientation                 Orientation Level: Disoriented to, Time, Situation Current Attention Level: Selective     Safety/Judgement: Decreased awareness of safety, Decreased awareness of deficits Awareness: Emergent Problem Solving: Slow processing, Decreased initiation, Difficulty sequencing, Requires verbal  cues, Requires tactile cues          Exercises      General Comments General comments (skin integrity, edema, etc.): Tachycardi into 130s with minimal  mobility in bed.      Pertinent Vitals/Pain Pain Assessment Pain Assessment: No/denies pain    Home Living                          Prior Function            PT Goals (current goals can now be found in the care plan section) Acute Rehab PT Goals Patient Stated Goal: to go home PT Goal Formulation: With patient Time For Goal Achievement: 06/07/22 Potential to Achieve Goals: Good Progress towards PT goals: Goals downgraded-see care plan    Frequency    Min 3X/week      PT Plan Discharge plan needs to be updated    Co-evaluation              AM-PAC PT "6 Clicks" Mobility   Outcome Measure  Help needed turning from your back to your side while in a flat bed without using bedrails?: A Lot Help needed moving from lying on your back to sitting on the side of a flat bed without using bedrails?: A Lot Help needed moving to and from a bed to a chair (including a wheelchair)?: Total Help needed standing up from a chair using your arms (e.g., wheelchair or bedside chair)?: Total Help needed to walk in hospital room?: Total Help needed climbing 3-5 steps with a railing? : Total 6 Click Score: 8    End of Session Equipment Utilized During Treatment: Gait belt;Oxygen Activity Tolerance: Treatment limited secondary to medical complications (Comment) (Confusion, elevated HR) Patient left: with call bell/phone within reach;in bed;with bed alarm set;with SCD's reapplied (Mitts in place) Nurse Communication: Mobility status;Other (comment) (decline in function) PT Visit Diagnosis: Other abnormalities of gait and mobility (R26.89);Difficulty in walking, not elsewhere classified (R26.2);Unsteadiness on feet (R26.81);Other symptoms and signs involving the nervous system (R29.898)     Time: 0102-7253 PT Time Calculation (min) (ACUTE ONLY): 19 min  Charges:  $Therapeutic Activity: 8-22 mins                     Candie Mile, PT, DPT Physical Therapist Acute  Rehabilitation Services [place]  05/26/2022, 4:55 PM

## 2022-05-26 NOTE — Progress Notes (Signed)
   05/26/22 0300  Assess: MEWS Score  Temp 99.5 F (37.5 C)  BP (!) 130/96  MAP (mmHg) 108  Pulse Rate (!) 107  ECG Heart Rate (!) 108  Resp (!) 26  Level of Consciousness Alert  SpO2 100 %  O2 Device Nasal Cannula  O2 Flow Rate (L/min) 3 L/min  Assess: MEWS Score  MEWS Temp 0  MEWS Systolic 0  MEWS Pulse 1  MEWS RR 2  MEWS LOC 0  MEWS Score 3  MEWS Score Color Yellow  Assess: if the MEWS score is Yellow or Red  Were vital signs taken at a resting state? Yes  Focused Assessment No change from prior assessment  Does the patient meet 2 or more of the SIRS criteria? Yes  Does the patient have a confirmed or suspected source of infection? Yes  Provider and Rapid Response Notified? No  MEWS guidelines implemented *See Row Information* No, previously yellow, continue vital signs every 4 hours  Treat  MEWS Interventions Administered prn meds/treatments  Assess: SIRS CRITERIA  SIRS Temperature  0  SIRS Pulse 1  SIRS Respirations  1  SIRS WBC 1  SIRS Score Sum  3

## 2022-05-26 NOTE — Progress Notes (Signed)
Pt is from home with spouse. He is active with [facility] for PCP. He uses [employer] for medication assistance.  TOC following for d/c needs.

## 2022-05-26 NOTE — Progress Notes (Signed)
Received a call from bedside RN regarding the patient having improvement in mood and behavior.  RN requests if one-to-one sitter could be Dc'd to better utilize resources.  Per medical record, the patient is unsteady and at high risk for fall which was the reason for ordering bedside sitter by dayshift attending.    We will de-escalate one-to-one sitter to TeleSitter.  Fall precautions are in place.    Will continue to closely monitor and treat as indicated.  Time 15 minutes.

## 2022-05-26 NOTE — Progress Notes (Signed)
   05/26/22 2000  Assess: MEWS Score  Temp 100.1 F (37.8 C)  BP (!) 150/92  MAP (mmHg) 106  Pulse Rate (!) 117  ECG Heart Rate (!) 118  Resp 20  Level of Consciousness Alert  SpO2 99 %  O2 Device Nasal Cannula  O2 Flow Rate (L/min) 3 L/min  Assess: if the MEWS score is Yellow or Red  Were vital signs taken at a resting state? Yes  Focused Assessment No change from prior assessment  Does the patient meet 2 or more of the SIRS criteria? Yes  Does the patient have a confirmed or suspected source of infection? Yes  Provider and Rapid Response Notified? No  MEWS guidelines implemented *See Row Information* No, previously yellow, continue vital signs every 4 hours  Treat  MEWS Interventions Administered scheduled meds/treatments  Pain Scale 0-10  Pain Score Asleep  Assess: SIRS CRITERIA  SIRS Temperature  0  SIRS Pulse 1  SIRS Respirations  0  SIRS WBC 1  SIRS Score Sum  2

## 2022-05-26 NOTE — Progress Notes (Addendum)
PROGRESS NOTE    Stephen Bowers  RXV:[id number] DOB: April 03, 1961 DOA: 05/21/2022 PCP: Ladell Pier, MD   Brief Narrative:  HPI: This is a 61 year old male with past medical history hypertension, CKD, dyslipidemia, alcohol use, peptic ulcer disease, thrombocytopenia and liver cirrhosis.  EMS called because the patient was confused, had nausea, vomiting and hematemesis.     On arrival to the ER he was febrile at 103 and COVID-positive.  Imaging showed pneumonia vs aspiration pneumonia.  Patient had lactic acidosis of 4.8, decreased to 2.6 after sepsis bundle IVF bolus.  Imaging also confirms liver cirrhosis.  Platelets 42 (patient has chronic thrombocytopenia).  In the ER patient received 1000 mg of Tylenol, Tmax increased to 104.2 oral.  Antibiotics given includes Rocephin, Flagyl and azithromycin.  Blood cultures x2 and urine culture collected.  80 mg IV Protonix also given.   Patient does not recall the events that brought him here.  Patient states he was simply dizzy.  He endorses drinking 3-4 shots of brandy and 3-4 bottles every weekend..  He states he does not get withdrawal symptoms.    Assessment & Plan:   Principal Problem:   Alcoholic gastritis with bleeding Active Problems:   HTN (hypertension)   Hyperlipidemia   Alcohol use disorder, mild, abuse   Seizure-like activity (HCC)   Thrombocytopenia (HCC)   Alcohol abuse   Anemia of chronic disease   Duodenal ulcer   Pneumonia due to COVID-19 virus   Aspiration pneumonia (HCC)   Lactic acid acidosis   Chronic liver failure (HCC)   Gout   Hematemesis with nausea   Alcoholic cirrhosis of liver without ascites (HCC)  Severe sepsis COVID-19 pneumonia/possible aspiration pneumonia: Meets criteria for severe sepsis based on fever, tachycardia, tachypnea and lactic acid of> 2. Chest x-ray unremarkable but follow-up CTA indicated bilateral patchy lung opacities with a confluent opacity in the right lower lobe consistent with  multifocal pneumonia.  Procalcitonin also elevated indicating superimposed bacterial pneumonia.  Last temperature spike was around 4 PM on 05/22/2022.  Clinically improving however he has significantly worsening leukocytosis which went up from 11 yesterday to 20 today.  Lactic acidosis resolved.  Procalcitonin improving.  Continue remdesivir.  No indication of dexamethasone.  Patient was encouraged to prone, out of bed to chair, to use incentive spirometry and flutter valve.  CTA negative for PE.  Continue Zosyn for total of 5 days, will complete the course today.  All cultures negative.  Patient is hypoxic requiring 3 L oxygen, will repeat chest x-ray today.  Procalcitonin improving.  He was febrile last night.  History of alcoholic gastritis/?  Hematemesis/anemia of chronic disease: Patient is a poor historian.  Unsure whether he had hematemesis or hemoptysis.  He cannot recall anything at all.  He says that he came in because of the dizziness.  Hemoglobin fairly stable.  GI has seen him, no plans for EGD.  Continue PPI.   Alcoholic liver cirrhosis/coagulopathy: INR slightly elevated at 1.1.  Patient noncompliant, continues to drink, last drink almost 2 days prior to admission.  Will avoid Tylenol.  Slightly elevated LFTs than his baseline, AST much more elevated, ALT is normal.  Indicative of alcoholic liver disease.  Monitor closely.  Alcohol withdrawal: Starting afternoon of 05/24/2022, patient has started having alcohol withdrawal, he was too agitated to be calm down with couple of doses of Haldol as well as Ativan and subsequently he needed to be placed in soft restraints as he was unsteady and was trying  to leave.  He continues to have a high CIWA score, has been requiring as needed Ativan, soft restraints have been removed.  Reportedly, patient was trying to get out of the bed and trying to go home, he is unsteady and high risk for the fall, I have ordered bedside sitter.  Nurses  aware.  Hypokalemia: Resolved.  Hypomagnesemia: Resolved.  Acute metabolic encephalopathy: Now confused due to alcohol withdrawal.  Chronic thrombocytopenia: Secondary to liver cirrhosis and now with sepsis and COVID.  Stable.  Essential hypertension: Controlled.  Continue Norvasc.  DVT prophylaxis: SCDs Start: 05/22/22 0435   Code Status: Full Code  Family Communication:  None present at bedside.  Status is: Inpatient Remains inpatient appropriate because:  withdrawing from alcohol.   Estimated body mass index is 27.62 kg/m as calculated from the following:   Height as of this encounter: '5\' 11"'$  (1.803 m).   Weight as of this encounter: 89.8 kg.    Nutritional Assessment: Body mass index is 27.62 kg/m.[name] Seen by dietician.  I agree with the assessment and plan as outlined below: Nutrition Status:        . Skin Assessment: I have examined the patient's skin and I agree with the wound assessment as performed by the wound care RN as outlined below:    Consultants:  GI  Procedures:  None  Antimicrobials:  Anti-infectives (From admission, onward)    Start     Dose/Rate Route Frequency Ordered Stop   05/23/22 1000  remdesivir 100 mg in sodium chloride 0.9 % 100 mL IVPB       See Hyperspace for full Linked Orders Report.   100 mg 200 mL/hr over 30 Minutes Intravenous Daily 05/22/22 0448 05/24/22 1535   05/22/22 2200  vancomycin (VANCOREADY) IVPB 1250 mg/250 mL  Status:  Discontinued        1,250 mg 166.7 mL/hr over 90 Minutes Intravenous Every 12 hours 05/22/22 0550 05/22/22 1124   05/22/22 1400  ceFEPIme (MAXIPIME) 2 g in sodium chloride 0.9 % 100 mL IVPB  Status:  Discontinued        2 g 200 mL/hr over 30 Minutes Intravenous Every 8 hours 05/22/22 0550 05/22/22 1125   05/22/22 1400  piperacillin-tazobactam (ZOSYN) IVPB 3.375 g        3.375 g 12.5 mL/hr over 240 Minutes Intravenous Every 8 hours 05/22/22 1137     05/22/22 0630  remdesivir 200 mg in sodium  chloride 0.9% 250 mL IVPB       See Hyperspace for full Linked Orders Report.   200 mg 580 mL/hr over 30 Minutes Intravenous Once 05/22/22 0448 05/22/22 0651   05/22/22 0630  vancomycin (VANCOREADY) IVPB 1500 mg/300 mL        1,500 mg 150 mL/hr over 120 Minutes Intravenous  Once 05/22/22 0550 05/22/22 0935   05/22/22 0200  azithromycin (ZITHROMAX) 500 mg in sodium chloride 0.9 % 250 mL IVPB  Status:  Discontinued        500 mg 250 mL/hr over 60 Minutes Intravenous Every 24 hours 05/22/22 0159 05/22/22 0541   05/22/22 0015  cefTRIAXone (ROCEPHIN) 2 g in sodium chloride 0.9 % 100 mL IVPB        2 g 200 mL/hr over 30 Minutes Intravenous  Once 05/22/22 0000 05/22/22 0105   05/22/22 0015  metroNIDAZOLE (FLAGYL) IVPB 500 mg  Status:  Discontinued        500 mg 100 mL/hr over 60 Minutes Intravenous Every 12 hours 05/22/22 0000 05/22/22 0542  Subjective:  Patient seen and examined.  He is confused. Objective: Vitals:   05/26/22 0300 05/26/22 0500 05/26/22 0800 05/26/22 0956  BP: (!) 130/96 (!) 154/102 (!) 129/111 (!) 152/99  Pulse: (!) 107 (!) 109 (!) 103 (!) 125  Resp: (!) 26 (!) 35 20   Temp: 99.5 F (37.5 C) 99.5 F (37.5 C) 99 F (37.2 C)   TempSrc: Axillary Axillary Axillary   SpO2: 100% 100% 100% 99%  Weight:      Height:        Intake/Output Summary (Last 24 hours) at 05/26/2022 1143 Last data filed at 05/26/2022 0900 Gross per 24 hour  Intake 404.96 ml  Output 950 ml  Net -545.04 ml    Filed Weights   05/21/22 2346  Weight: 89.8 kg    Examination:  General exam: Appears confused and lethargic Respiratory system: Diminished breath sounds. Respiratory effort normal. Cardiovascular system: S1 & S2 heard, RRR. No JVD, murmurs, rubs, gallops or clicks. No pedal edema. Gastrointestinal system: Abdomen is nondistended, soft and nontender. No organomegaly or masses felt. Normal bowel sounds heard. Central nervous system: Lethargic and confused.  Data  Reviewed: I have personally reviewed following labs and imaging studies  CBC: Recent Labs  Lab 05/21/22 2348 05/22/22 0509 05/22/22 1826 05/23/22 0332 05/24/22 0554 05/25/22 0157 05/26/22 0933  WBC 9.3 11.3*  --   --  20.0* 17.3* 18.5*  NEUTROABS 8.1* 9.8*  --   --  17.1* 13.4* 14.8*  HGB 9.0* 8.2* 7.5* 7.9* 8.3* 8.3* 8.7*  HCT 28.3* 24.7* 23.1* 23.6* 24.3* 25.4* 27.0*  MCV 86.8 84.9  --   --  83.2 84.1 84.9  PLT 42* 39*  --   --  58* 79* 127*    Basic Metabolic Panel: Recent Labs  Lab 05/22/22 0509 05/23/22 0332 05/24/22 0554 05/25/22 0157 05/26/22 0933  NA 139 134* 134* 134* 135  K 3.4* 3.5 3.4* 3.8 3.5  CL 107 104 101 99 98  CO2 17* 18* 17* 18* 19*  GLUCOSE 137* 102* 92 102* 124*  BUN '7 6 11 8 11  '$ CREATININE 0.88 0.87 0.79 0.76 0.87  CALCIUM 7.6* 7.9* 8.4* 9.0 9.0  MG 1.2* 2.0  --   --   --   PHOS 3.2  --   --   --   --     GFR: Estimated Creatinine Clearance: 97.4 mL/min (by C-G formula based on SCr of 0.87 mg/dL). Liver Function Tests: Recent Labs  Lab 05/21/22 2348 05/22/22 0509 05/23/22 0332 05/24/22 0554 05/26/22 0933  AST 195* 191* 131* 110* 83*  ALT 44 41 38 36 35  ALKPHOS 32* 27* 27* 35* 38  BILITOT 0.3 0.5 1.2 1.2 1.3*  PROT 8.2* 7.4 7.0 7.4 7.8  ALBUMIN 3.7 3.2* 2.9* 3.2* 3.1*    Recent Labs  Lab 05/21/22 2348  LIPASE 27    Recent Labs  Lab 05/22/22 0509  AMMONIA 28    Coagulation Profile: Recent Labs  Lab 05/21/22 2348 05/22/22 0509  INR 1.3* 1.3*    Cardiac Enzymes: No results for input(s): "CKTOTAL", "CKMB", "CKMBINDEX", "TROPONINI" in the last 168 hours. BNP (last 3 results) No results for input(s): "PROBNP" in the last 8760 hours. HbA1C: No results for input(s): "HGBA1C" in the last 72 hours. CBG: Recent Labs  Lab 05/24/22 1205  GLUCAP 130*    Lipid Profile: No results for input(s): "CHOL", "HDL", "LDLCALC", "TRIG", "CHOLHDL", "LDLDIRECT" in the last 72 hours.  Thyroid Function Tests: No results for  input(s): "TSH", "T4TOTAL", "FREET4", "T3FREE", "THYROIDAB" in the last 72 hours. Anemia Panel: No results for input(s): "VITAMINB12", "FOLATE", "FERRITIN", "TIBC", "IRON", "RETICCTPCT" in the last 72 hours.  Sepsis Labs: Recent Labs  Lab 05/22/22 0200 05/22/22 0944 05/22/22 1440 05/23/22 0821 05/24/22 1205 05/26/22 0933  PROCALCITON  --  11.59  --   --  7.69 2.08  LATICACIDVEN 2.6* 4.7* 3.4* 1.4  --   --      Recent Results (from the past 240 hour(s))  Resp Panel by RT-PCR (Flu A&B, Covid)     Status: Abnormal   Collection Time: 05/21/22 12:02 AM   Specimen: Nasal Swab  Result Value Ref Range Status   SARS Coronavirus 2 by RT PCR POSITIVE (A) NEGATIVE Final    Comment: (NOTE) SARS-CoV-2 target nucleic acids are DETECTED.  The SARS-CoV-2 RNA is generally detectable in upper respiratory specimens during the acute phase of infection. Positive results are indicative of the presence of the identified virus, but do not rule out bacterial infection or co-infection with other pathogens not detected by the test. Clinical correlation with patient history and other diagnostic information is necessary to determine patient infection status. The expected result is Negative.  Fact Sheet for Patients: EntrepreneurPulse.com.au  Fact Sheet for Healthcare Providers: IncredibleEmployment.be  This test is not yet approved or cleared by the [place] FDA and  has been authorized for detection and/or diagnosis of SARS-CoV-2 by FDA under an Emergency Use Authorization (EUA).  This EUA will remain in effect (meaning this test can be used) for the duration of  the COVID-19 declaration under Section 564(b)(1) of the A ct, 21 U.S.C. section 360bbb-3(b)(1), unless the authorization is terminated or revoked sooner.     Influenza A by PCR NEGATIVE NEGATIVE Final   Influenza B by PCR NEGATIVE NEGATIVE Final    Comment: (NOTE) The Xpert Xpress  SARS-CoV-2/FLU/RSV plus assay is intended as an aid in the diagnosis of influenza from Nasopharyngeal swab specimens and should not be used as a sole basis for treatment. Nasal washings and aspirates are unacceptable for Xpert Xpress SARS-CoV-2/FLU/RSV testing.  Fact Sheet for Patients: EntrepreneurPulse.com.au  Fact Sheet for Healthcare Providers: IncredibleEmployment.be  This test is not yet approved or cleared by the [place] FDA and has been authorized for detection and/or diagnosis of SARS-CoV-2 by FDA under an Emergency Use Authorization (EUA). This EUA will remain in effect (meaning this test can be used) for the duration of the COVID-19 declaration under Section 564(b)(1) of the Act, 21 U.S.C. section 360bbb-3(b)(1), unless the authorization is terminated or revoked.  Performed at [facility], [place] [address], [place], [place] [address]   Blood culture (routine x 2)     Status: None (Preliminary result)   Collection Time: 05/21/22 11:56 PM   Specimen: BLOOD  Result Value Ref Range Status   Specimen Description BLOOD SITE NOT SPECIFIED  Final   Special Requests   Final    BOTTLES DRAWN AEROBIC AND ANAEROBIC Blood Culture results may not be optimal due to an excessive volume of blood received in culture bottles   Culture   Final    NO GROWTH 4 DAYS Performed at [facility], [place] [address], [place], [place] [address]    Report Status PENDING  Incomplete  Blood culture (routine x 2)     Status: None (Preliminary result)   Collection Time: 05/21/22 11:56 PM   Specimen: BLOOD  Result Value Ref Range Status   Specimen Description BLOOD SITE  NOT SPECIFIED  Final   Special Requests   Final    BOTTLES DRAWN AEROBIC AND ANAEROBIC Blood Culture results may not be optimal due to an inadequate volume of blood received in culture bottles   Culture   Final    NO GROWTH 4 DAYS Performed at [facility], [place] [address] [place], [place] [address]    Report Status PENDING  Incomplete  Urine Culture     Status: None   Collection Time: 05/22/22  4:49 AM   Specimen: Urine, Clean Catch  Result Value Ref Range Status   Specimen Description URINE, CLEAN CATCH  Final   Special Requests NONE  Final   Culture   Final    NO GROWTH Performed at [facility], [place] [address] [place], [place] [address]    Report Status 05/23/2022 FINAL  Final  MRSA Next Gen by PCR, Nasal     Status: None   Collection Time: 05/22/22  7:33 AM   Specimen: Nasal Mucosa; Nasal Swab  Result Value Ref Range Status   MRSA by PCR Next Gen NOT DETECTED NOT DETECTED Final    Comment: (NOTE) The GeneXpert MRSA Assay (FDA approved for NASAL specimens only), is one component of a comprehensive MRSA colonization surveillance program. It is not intended to diagnose MRSA infection nor to guide or monitor treatment for MRSA infections. Test performance is not FDA approved in patients less than 61 years old. Performed at [facility], [place] [address] [place], [place] [address]      Radiology Studies: No results found.  Scheduled Meds:  amLODipine  10 mg Oral Daily   folic acid  1 mg Oral Daily   multivitamin with minerals  1 tablet Oral Daily   pantoprazole  40 mg Oral BID   thiamine  100 mg Oral Daily   Or   thiamine  100 mg Intravenous Daily   Continuous Infusions:  sodium chloride 100 mL/hr at 05/26/22 0251   piperacillin-tazobactam (ZOSYN)  IV 3.375 g (05/26/22 0507)     LOS: 4 days   Darliss Cheney, MD [facility]  05/26/2022, 11:43 AM   *Please note that this is a verbal dictation therefore any spelling or grammatical errors are due to the "[employer] One" system interpretation.  Please page via [place] and do not message via secure chat for urgent patient care matters. Secure chat can be used for non urgent patient care matters.  How to contact the [facility] Attending or Consulting provider [place] or  covering provider during after hours [place], for this patient?  Check the care team in [facility] and look for a) attending/consulting [facility] provider listed and b) the [facility] team listed. Page or secure chat [phone]. Log into [email or web address] and use [place]'s universal password to access. If you do not have the password, please contact the hospital operator. Locate the [facility] provider you are looking for under [facility] and page to a number that you can be directly reached. If you still have difficulty reaching the provider, please page the [facility] (Director on Call) for the Hospitalists listed on amion for assistance.

## 2022-05-27 ENCOUNTER — Inpatient Hospital Stay (HOSPITAL_COMMUNITY): Payer: Commercial Managed Care - HMO

## 2022-05-27 LAB — CULTURE, BLOOD (ROUTINE X 2)
Culture: NO GROWTH
Culture: NO GROWTH

## 2022-05-27 NOTE — Progress Notes (Signed)
   05/27/22 1940  Assess: MEWS Score  Temp (!) 100.6 F (38.1 C)  BP 124/79  MAP (mmHg) 92  Pulse Rate (!) 101  ECG Heart Rate (!) 101  Resp 20  Level of Consciousness Alert  SpO2 100 %  O2 Device Nasal Cannula  Patient Activity (if Appropriate) In bed  O2 Flow Rate (L/min) 3 L/min  Assess: MEWS Score  MEWS Temp 1  MEWS Systolic 0  MEWS Pulse 1  MEWS RR 0  MEWS LOC 0  MEWS Score 2  MEWS Score Color Yellow  Assess: if the MEWS score is Yellow or Red  Were vital signs taken at a resting state? Yes  Focused Assessment No change from prior assessment  Does the patient meet 2 or more of the SIRS criteria? Yes  Does the patient have a confirmed or suspected source of infection? Yes  Provider and Rapid Response Notified? No  MEWS guidelines implemented *See Row Information* No, other (Comment)  Treat  MEWS Interventions Administered prn meds/treatments  Pain Scale 0-10  Pain Score Asleep  Assess: SIRS CRITERIA  SIRS Temperature  0  SIRS Pulse 1  SIRS Respirations  0  SIRS WBC 1  SIRS Score Sum  2

## 2022-05-27 NOTE — Progress Notes (Signed)
PROGRESS NOTE    Stephen Bowers  ERD:[id number] DOB: Oct 31, 1960 DOA: 05/21/2022 PCP: Ladell Pier, MD   Brief Narrative:  HPI: This is a 61 year old male with past medical history hypertension, CKD, dyslipidemia, alcohol use, peptic ulcer disease, thrombocytopenia and liver cirrhosis.  EMS called because the patient was confused, had nausea, vomiting and hematemesis.     On arrival to the ER he was febrile at 103 and COVID-positive.  Imaging showed pneumonia vs aspiration pneumonia.  Patient had lactic acidosis of 4.8, decreased to 2.6 after sepsis bundle IVF bolus.  Imaging also confirms liver cirrhosis.  Platelets 42 (patient has chronic thrombocytopenia).  In the ER patient received 1000 mg of Tylenol, Tmax increased to 104.2 oral.  Antibiotics given includes Rocephin, Flagyl and azithromycin.  Blood cultures x2 and urine culture collected.  80 mg IV Protonix also given.   Patient does not recall the events that brought him here.  Patient states he was simply dizzy.  He endorses drinking 3-4 shots of brandy and 3-4 bottles every weekend..  He states he does not get withdrawal symptoms.    Assessment & Plan:   Principal Problem:   Alcoholic gastritis with bleeding Active Problems:   HTN (hypertension)   Hyperlipidemia   Alcohol use disorder, mild, abuse   Seizure-like activity (HCC)   Thrombocytopenia (HCC)   Alcohol abuse   Anemia of chronic disease   Duodenal ulcer   Pneumonia due to COVID-19 virus   Aspiration pneumonia (HCC)   Lactic acid acidosis   Chronic liver failure (HCC)   Gout   Hematemesis with nausea   Alcoholic cirrhosis of liver without ascites (HCC)  Severe sepsis COVID-19 pneumonia/acute hypoxic respiratory failure secondary to possible aspiration pneumonia: Meets criteria for severe sepsis based on fever, tachycardia, tachypnea and lactic acid of> 2. Chest x-ray unremarkable but follow-up CTA indicated bilateral patchy lung opacities with a confluent  opacity in the right lower lobe consistent with multifocal pneumonia.  Procalcitonin also elevated indicating superimposed bacterial pneumonia.  He was improving but leukocytosis got worse.  Lactic acidosis resolved.  Patient was afebrile for a period of time but has now started to spike fevers, last temperature spike 101.1 at 3 AM on 05/27/2022 however procalcitonin improving.  Completed remdesivir.  Repeated chest x-ray today which is unremarkable.  No indication of dexamethasone.  Patient is also hypoxic requiring 3 L of oxygen.  CTA negative for PE.  Previously all the cultures are negative, will repeat blood culture today.    History of alcoholic gastritis/?  Hematemesis/anemia of chronic disease: Patient is a poor historian.  Unsure whether he had hematemesis or hemoptysis.  He cannot recall anything at all.  He says that he came in because of the dizziness.  Hemoglobin fairly stable.  GI has seen him, no plans for EGD.  Continue PPI.   Alcoholic liver cirrhosis/coagulopathy: INR slightly elevated at 1.1.  Patient noncompliant, continues to drink, last drink almost 2 days prior to admission.  Will avoid Tylenol.  Slightly elevated LFTs than his baseline, AST much more elevated, ALT is normal.  Indicative of alcoholic liver disease.  Monitor closely.  Alcohol withdrawal: Starting afternoon of 05/24/2022, patient has started having alcohol withdrawal, he was too agitated to be calm down with couple of doses of Haldol as well as Ativan and subsequently he needed to be placed in soft restraints as he was unsteady and was trying to leave.  He continues to have a high CIWA score, has been requiring  as needed Ativan, he is off of restraints but has had mittens.  Hypokalemia: Resolved.  Hypomagnesemia: Resolved.  Acute metabolic encephalopathy: Now confused due to alcohol withdrawal.  Chronic thrombocytopenia: Secondary to liver cirrhosis and now with sepsis and COVID.  Stable.  Essential hypertension:  Controlled.  Continue Norvasc.  DVT prophylaxis: SCDs Start: 05/22/22 0435   Code Status: Full Code  Family Communication:  None present at bedside.  Status is: Inpatient Remains inpatient appropriate because:  withdrawing from alcohol.   Estimated body mass index is 27.62 kg/m as calculated from the following:   Height as of this encounter: '5\' 11"'$  (1.803 m).   Weight as of this encounter: 89.8 kg.    Nutritional Assessment: Body mass index is 27.62 kg/m.[name] Seen by dietician.  I agree with the assessment and plan as outlined below: Nutrition Status:        . Skin Assessment: I have examined the patient's skin and I agree with the wound assessment as performed by the wound care RN as outlined below:    Consultants:  GI  Procedures:  None  Antimicrobials:  Anti-infectives (From admission, onward)    Start     Dose/Rate Route Frequency Ordered Stop   05/23/22 1000  remdesivir 100 mg in sodium chloride 0.9 % 100 mL IVPB       See Hyperspace for full Linked Orders Report.   100 mg 200 mL/hr over 30 Minutes Intravenous Daily 05/22/22 0448 05/24/22 1535   05/22/22 2200  vancomycin (VANCOREADY) IVPB 1250 mg/250 mL  Status:  Discontinued        1,250 mg 166.7 mL/hr over 90 Minutes Intravenous Every 12 hours 05/22/22 0550 05/22/22 1124   05/22/22 1400  ceFEPIme (MAXIPIME) 2 g in sodium chloride 0.9 % 100 mL IVPB  Status:  Discontinued        2 g 200 mL/hr over 30 Minutes Intravenous Every 8 hours 05/22/22 0550 05/22/22 1125   05/22/22 1400  piperacillin-tazobactam (ZOSYN) IVPB 3.375 g        3.375 g 12.5 mL/hr over 240 Minutes Intravenous Every 8 hours 05/22/22 1137     05/22/22 0630  remdesivir 200 mg in sodium chloride 0.9% 250 mL IVPB       See Hyperspace for full Linked Orders Report.   200 mg 580 mL/hr over 30 Minutes Intravenous Once 05/22/22 0448 05/22/22 0651   05/22/22 0630  vancomycin (VANCOREADY) IVPB 1500 mg/300 mL        1,500 mg 150 mL/hr over 120 Minutes  Intravenous  Once 05/22/22 0550 05/22/22 0935   05/22/22 0200  azithromycin (ZITHROMAX) 500 mg in sodium chloride 0.9 % 250 mL IVPB  Status:  Discontinued        500 mg 250 mL/hr over 60 Minutes Intravenous Every 24 hours 05/22/22 0159 05/22/22 0541   05/22/22 0015  cefTRIAXone (ROCEPHIN) 2 g in sodium chloride 0.9 % 100 mL IVPB        2 g 200 mL/hr over 30 Minutes Intravenous  Once 05/22/22 0000 05/22/22 0105   05/22/22 0015  metroNIDAZOLE (FLAGYL) IVPB 500 mg  Status:  Discontinued        500 mg 100 mL/hr over 60 Minutes Intravenous Every 12 hours 05/22/22 0000 05/22/22 0542         Subjective: Patient seen and examined.  He was lethargic due to receiving Ativan.  However he was oriented x 2 today which is improvement compared to yesterday.  Objective: Vitals:   05/27/22 0300 05/27/22  0508 05/27/22 0513 05/27/22 0800  BP: (!) 150/87 137/75 137/75 123/81  Pulse: (!) 107 (!) 113 (!) 103 94  Resp: (!) 21 (!) 26 (!) 24   Temp: (!) 101.1 F (38.4 C)  99.6 F (37.6 C)   TempSrc: Oral  Oral   SpO2: 99% 99%    Weight:      Height:        Intake/Output Summary (Last 24 hours) at 05/27/2022 1130 Last data filed at 05/27/2022 0840 Gross per 24 hour  Intake 480 ml  Output --  Net 480 ml    Filed Weights   05/21/22 2346  Weight: 89.8 kg    Examination:  General exam: Appears slightly less confused compared to yesterday and more awake as well. Respiratory system: Clear to auscultation. Respiratory effort normal. Cardiovascular system: S1 & S2 heard, RRR. No JVD, murmurs, rubs, gallops or clicks. No pedal edema. Gastrointestinal system: Abdomen is nondistended, soft and nontender. No organomegaly or masses felt. Normal bowel sounds heard. Central nervous system: Slightly lethargic and oriented x 2, no focal neurological deficits. Extremities: Symmetric 5 x 5 power. Skin: No rashes, lesions or ulcers.    Data Reviewed: I have personally reviewed following labs and imaging  studies  CBC: Recent Labs  Lab 05/21/22 2348 05/22/22 0509 05/22/22 1826 05/23/22 0332 05/24/22 0554 05/25/22 0157 05/26/22 0933  WBC 9.3 11.3*  --   --  20.0* 17.3* 18.5*  NEUTROABS 8.1* 9.8*  --   --  17.1* 13.4* 14.8*  HGB 9.0* 8.2* 7.5* 7.9* 8.3* 8.3* 8.7*  HCT 28.3* 24.7* 23.1* 23.6* 24.3* 25.4* 27.0*  MCV 86.8 84.9  --   --  83.2 84.1 84.9  PLT 42* 39*  --   --  58* 79* 127*    Basic Metabolic Panel: Recent Labs  Lab 05/22/22 0509 05/23/22 0332 05/24/22 0554 05/25/22 0157 05/26/22 0933  NA 139 134* 134* 134* 135  K 3.4* 3.5 3.4* 3.8 3.5  CL 107 104 101 99 98  CO2 17* 18* 17* 18* 19*  GLUCOSE 137* 102* 92 102* 124*  BUN '7 6 11 8 11  '$ CREATININE 0.88 0.87 0.79 0.76 0.87  CALCIUM 7.6* 7.9* 8.4* 9.0 9.0  MG 1.2* 2.0  --   --   --   PHOS 3.2  --   --   --   --     GFR: Estimated Creatinine Clearance: 97.4 mL/min (by C-G formula based on SCr of 0.87 mg/dL). Liver Function Tests: Recent Labs  Lab 05/21/22 2348 05/22/22 0509 05/23/22 0332 05/24/22 0554 05/26/22 0933  AST 195* 191* 131* 110* 83*  ALT 44 41 38 36 35  ALKPHOS 32* 27* 27* 35* 38  BILITOT 0.3 0.5 1.2 1.2 1.3*  PROT 8.2* 7.4 7.0 7.4 7.8  ALBUMIN 3.7 3.2* 2.9* 3.2* 3.1*    Recent Labs  Lab 05/21/22 2348  LIPASE 27    Recent Labs  Lab 05/22/22 0509  AMMONIA 28    Coagulation Profile: Recent Labs  Lab 05/21/22 2348 05/22/22 0509  INR 1.3* 1.3*    Cardiac Enzymes: No results for input(s): "CKTOTAL", "CKMB", "CKMBINDEX", "TROPONINI" in the last 168 hours. BNP (last 3 results) No results for input(s): "PROBNP" in the last 8760 hours. HbA1C: No results for input(s): "HGBA1C" in the last 72 hours. CBG: Recent Labs  Lab 05/24/22 1205  GLUCAP 130*    Lipid Profile: No results for input(s): "CHOL", "HDL", "LDLCALC", "TRIG", "CHOLHDL", "LDLDIRECT" in the last 72 hours.  Thyroid  Function Tests: No results for input(s): "TSH", "T4TOTAL", "FREET4", "T3FREE", "THYROIDAB" in the  last 72 hours. Anemia Panel: No results for input(s): "VITAMINB12", "FOLATE", "FERRITIN", "TIBC", "IRON", "RETICCTPCT" in the last 72 hours.  Sepsis Labs: Recent Labs  Lab 05/22/22 0200 05/22/22 0944 05/22/22 1440 05/23/22 0821 05/24/22 1205 05/26/22 0933  PROCALCITON  --  11.59  --   --  7.69 2.08  LATICACIDVEN 2.6* 4.7* 3.4* 1.4  --   --      Recent Results (from the past 240 hour(s))  Resp Panel by RT-PCR (Flu A&B, Covid)     Status: Abnormal   Collection Time: 05/21/22 12:02 AM   Specimen: Nasal Swab  Result Value Ref Range Status   SARS Coronavirus 2 by RT PCR POSITIVE (A) NEGATIVE Final    Comment: (NOTE) SARS-CoV-2 target nucleic acids are DETECTED.  The SARS-CoV-2 RNA is generally detectable in upper respiratory specimens during the acute phase of infection. Positive results are indicative of the presence of the identified virus, but do not rule out bacterial infection or co-infection with other pathogens not detected by the test. Clinical correlation with patient history and other diagnostic information is necessary to determine patient infection status. The expected result is Negative.  Fact Sheet for Patients: EntrepreneurPulse.com.au  Fact Sheet for Healthcare Providers: IncredibleEmployment.be  This test is not yet approved or cleared by the [place] FDA and  has been authorized for detection and/or diagnosis of SARS-CoV-2 by FDA under an Emergency Use Authorization (EUA).  This EUA will remain in effect (meaning this test can be used) for the duration of  the COVID-19 declaration under Section 564(b)(1) of the A ct, 21 U.S.C. section 360bbb-3(b)(1), unless the authorization is terminated or revoked sooner.     Influenza A by PCR NEGATIVE NEGATIVE Final   Influenza B by PCR NEGATIVE NEGATIVE Final    Comment: (NOTE) The Xpert Xpress SARS-CoV-2/FLU/RSV plus assay is intended as an aid in the diagnosis of  influenza from Nasopharyngeal swab specimens and should not be used as a sole basis for treatment. Nasal washings and aspirates are unacceptable for Xpert Xpress SARS-CoV-2/FLU/RSV testing.  Fact Sheet for Patients: EntrepreneurPulse.com.au  Fact Sheet for Healthcare Providers: IncredibleEmployment.be  This test is not yet approved or cleared by the [place] FDA and has been authorized for detection and/or diagnosis of SARS-CoV-2 by FDA under an Emergency Use Authorization (EUA). This EUA will remain in effect (meaning this test can be used) for the duration of the COVID-19 declaration under Section 564(b)(1) of the Act, 21 U.S.C. section 360bbb-3(b)(1), unless the authorization is terminated or revoked.  Performed at [facility], [place] [address] [place], [place] [address]   Blood culture (routine x 2)     Status: None   Collection Time: 05/21/22 11:56 PM   Specimen: BLOOD  Result Value Ref Range Status   Specimen Description BLOOD SITE NOT SPECIFIED  Final   Special Requests   Final    BOTTLES DRAWN AEROBIC AND ANAEROBIC Blood Culture results may not be optimal due to an excessive volume of blood received in culture bottles   Culture   Final    NO GROWTH 5 DAYS Performed at [facility], [place] [address] [place], [place] [address]    Report Status 05/27/2022 FINAL  Final  Blood culture (routine x 2)     Status: None   Collection Time: 05/21/22 11:56 PM   Specimen: BLOOD  Result Value Ref Range Status   Specimen  Description BLOOD SITE NOT SPECIFIED  Final   Special Requests   Final    BOTTLES DRAWN AEROBIC AND ANAEROBIC Blood Culture results may not be optimal due to an inadequate volume of blood received in culture bottles   Culture   Final    NO GROWTH 5 DAYS Performed at [facility], [place] [address], [place], [place] [address]    Report Status 05/27/2022 FINAL  Final  Urine Culture     Status: None    Collection Time: 05/22/22  4:49 AM   Specimen: Urine, Clean Catch  Result Value Ref Range Status   Specimen Description URINE, CLEAN CATCH  Final   Special Requests NONE  Final   Culture   Final    NO GROWTH Performed at [facility], [place] [address], [place], [place] [address]    Report Status 05/23/2022 FINAL  Final  MRSA Next Gen by PCR, Nasal     Status: None   Collection Time: 05/22/22  7:33 AM   Specimen: Nasal Mucosa; Nasal Swab  Result Value Ref Range Status   MRSA by PCR Next Gen NOT DETECTED NOT DETECTED Final    Comment: (NOTE) The GeneXpert MRSA Assay (FDA approved for NASAL specimens only), is one component of a comprehensive MRSA colonization surveillance program. It is not intended to diagnose MRSA infection nor to guide or monitor treatment for MRSA infections. Test performance is not FDA approved in patients less than 17 years old. Performed at [facility], [place] [address], [place], [place] [address]      Radiology Studies: DG CHEST PORT 1 VIEW  Result Date: 05/27/2022 CLINICAL DATA:  Pneumonia. EXAM: PORTABLE CHEST 1 VIEW COMPARISON:  May 22, 2022. FINDINGS: The heart size and mediastinal contours are within normal limits. Both lungs are clear. The visualized skeletal structures are unremarkable. IMPRESSION: No active disease. Electronically Signed   By: Marijo Conception M.D.   On: 05/27/2022 09:17    Scheduled Meds:  amLODipine  10 mg Oral Daily   folic acid  1 mg Oral Daily   multivitamin with minerals  1 tablet Oral Daily   pantoprazole  40 mg Oral BID   thiamine  100 mg Oral Daily   Or   thiamine  100 mg Intravenous Daily   Continuous Infusions:  sodium chloride 100 mL/hr at 05/26/22 2122   piperacillin-tazobactam (ZOSYN)  IV 3.375 g (05/27/22 0511)     LOS: 5 days   Darliss Cheney, MD [facility]  05/27/2022, 11:30 AM   *Please note that this is a verbal dictation therefore any spelling or grammatical errors are due to  the "[place] [facility]" system interpretation.  Please page via [place] and do not message via secure chat for urgent patient care matters. Secure chat can be used for non urgent patient care matters.  How to contact the [facility] Attending or Consulting provider [place] or covering provider during after hours [place], for this patient?  Check the care team in [facility] and look for a) attending/consulting [facility] provider listed and b) the [facility] team listed. Page or secure chat [phone]. Log into [email or web address] and use [place]'s universal password to access. If you do not have the password, please contact the hospital operator. Locate the [facility] provider you are looking for under [facility] and page to a number that you can be directly reached. If you still have difficulty reaching the provider, please page the [facility] (Director on Call)  for the Hospitalists listed on amion for assistance.

## 2022-05-27 NOTE — Plan of Care (Signed)
  Problem: Activity: Goal: Risk for activity intolerance will decrease Outcome: Progressing   Problem: Safety: Goal: Ability to remain free from injury will improve Outcome: Progressing   

## 2022-05-27 NOTE — Plan of Care (Signed)

## 2022-05-27 NOTE — Progress Notes (Signed)
   05/27/22 1600  Assess: MEWS Score  Temp 100.3 F (37.9 C)  BP 131/85  MAP (mmHg) 96  Pulse Rate (!) 116  ECG Heart Rate (!) 116  Resp (!) 23  SpO2 97 %  O2 Device Nasal Cannula  Assess: MEWS Score  MEWS Temp 0  MEWS Systolic 0  MEWS Pulse 2  MEWS RR 1  MEWS LOC 0  MEWS Score 3  MEWS Score Color Yellow  Assess: if the MEWS score is Yellow or Red  Were vital signs taken at a resting state? Yes  Focused Assessment No change from prior assessment  Does the patient meet 2 or more of the SIRS criteria? Yes  Does the patient have a confirmed or suspected source of infection? Yes  Provider and Rapid Response Notified? No  MEWS guidelines implemented *See Row Information* No, other (Comment)  Treat  MEWS Interventions Administered prn meds/treatments  Pain Scale 0-10  Pain Score 0  Assess: SIRS CRITERIA  SIRS Temperature  0  SIRS Pulse 1  SIRS Respirations  1  SIRS WBC 1  SIRS Score Sum  3

## 2022-05-28 LAB — BASIC METABOLIC PANEL
Anion gap: 12 (ref 5–15)
BUN: 8 mg/dL (ref 6–20)
CO2: 22 mmol/L (ref 22–32)
Calcium: 8.9 mg/dL (ref 8.9–10.3)
Chloride: 103 mmol/L (ref 98–111)
Creatinine, Ser: 0.78 mg/dL (ref 0.61–1.24)
GFR, Estimated: >60 mL/min (ref >60)
Glucose, Bld: 110 mg/dL — ABNORMAL HIGH (ref 70–99)
Potassium: 3.2 mmol/L — ABNORMAL LOW (ref 3.5–5.1)
Sodium: 137 mmol/L (ref 135–145)

## 2022-05-28 LAB — CBC WITH DIFFERENTIAL/PLATELET
Abs Immature Granulocytes: 0.7 10*3/uL — ABNORMAL HIGH (ref 0.00–0.07)
Basophils Absolute: 0 10*3/uL (ref 0.0–0.1)
Basophils Relative: 0 %
Eosinophils Absolute: 0.3 10*3/uL (ref 0.0–0.5)
Eosinophils Relative: 2 %
HCT: 24.7 % — ABNORMAL LOW (ref 39.0–52.0)
Hemoglobin: 7.9 g/dL — ABNORMAL LOW (ref 13.0–17.0)
Lymphocytes Relative: 6 %
Lymphs Abs: 1 10*3/uL (ref 0.7–4.0)
MCH: 27.7 pg (ref 26.0–34.0)
MCHC: 32 g/dL (ref 30.0–36.0)
MCV: 86.7 fL (ref 80.0–100.0)
Metamyelocytes Relative: 1 %
Monocytes Absolute: 1.5 10*3/uL — ABNORMAL HIGH (ref 0.1–1.0)
Monocytes Relative: 9 %
Myelocytes: 3 %
Neutro Abs: 13 10*3/uL — ABNORMAL HIGH (ref 1.7–7.7)
Neutrophils Relative %: 79 %
Platelets: 246 10*3/uL (ref 150–400)
RBC: 2.85 MIL/uL — ABNORMAL LOW (ref 4.22–5.81)
RDW: 17.2 % — ABNORMAL HIGH (ref 11.5–15.5)
WBC: 16.4 10*3/uL — ABNORMAL HIGH (ref 4.0–10.5)
nRBC: 0 % (ref 0.0–0.2)
nRBC: 0 /100 WBC

## 2022-05-28 LAB — TROPONIN I (HIGH SENSITIVITY)
Troponin I (High Sensitivity): 12 ng/L (ref <18)
Troponin I (High Sensitivity): 12 ng/L (ref <18)

## 2022-05-28 LAB — PROCALCITONIN: Procalcitonin: 1.15 ng/mL

## 2022-05-28 MED ORDER — POTASSIUM CHLORIDE CRYS ER 20 MEQ PO TBCR
40.0000 meq | EXTENDED_RELEASE_TABLET | ORAL | Status: AC
  Administered 2022-05-28 (×2): 40 meq via ORAL
  Filled 2022-05-28 (×2): qty 2

## 2022-05-28 MED ORDER — LORAZEPAM 2 MG/ML IJ SOLN
0.5000 mg | INTRAMUSCULAR | Status: DC | PRN
  Administered 2022-05-28: 0.5 mg via INTRAVENOUS
  Filled 2022-05-28: qty 1

## 2022-05-28 NOTE — Plan of Care (Signed)
  Problem: Education: Goal: Knowledge of General Education information will improve Description: Including pain rating scale, medication(s)/side effects and non-pharmacologic comfort measures Outcome: Progressing   Problem: Nutrition: Goal: Adequate nutrition will be maintained Outcome: Progressing   Problem: Safety: Goal: Ability to remain free from injury will improve Outcome: Progressing   Problem: Skin Integrity: Goal: Risk for impaired skin integrity will decrease Outcome: Progressing   Problem: Safety: Goal: Non-violent Restraint(s) Outcome: Progressing

## 2022-05-28 NOTE — Progress Notes (Signed)
   05/28/22 2000  Assess: MEWS Score  Temp (!) 100.4 F (38 C)  BP (!) 141/89  MAP (mmHg) 105  Pulse Rate (!) 104  ECG Heart Rate (!) 103  Resp (!) 22  Level of Consciousness Alert  SpO2 100 %  O2 Device Room Air  Assess: MEWS Score  MEWS Temp 0  MEWS Systolic 0  MEWS Pulse 1  MEWS RR 1  MEWS LOC 0  MEWS Score 2  MEWS Score Color Yellow  Assess: if the MEWS score is Yellow or Red  Were vital signs taken at a resting state? Yes  Focused Assessment No change from prior assessment  Does the patient meet 2 or more of the SIRS criteria? Yes  Does the patient have a confirmed or suspected source of infection? Yes  Provider and Rapid Response Notified? No  MEWS guidelines implemented *See Row Information* No, other (Comment)  Treat  Pain Scale 0-10  Pain Score 0  Assess: SIRS CRITERIA  SIRS Temperature  0  SIRS Pulse 1  SIRS Respirations  1  SIRS WBC 1  SIRS Score Sum  3

## 2022-05-28 NOTE — Progress Notes (Signed)
Pharmacy Antibiotic Note  Stephen Bowers is a 61 y.o. male admitted on 05/21/2022 with pneumonia.  Suspect COVID vs bacterial source. S/p paxlovid. Pharmacy has been consulted to dose Zosyn.    Patient now febrile with Tmax 101 with wbc 16.4. Previously on RA now requiring 3L . Procal decreased to 1.15. Repeat CXR from 11/25 shows no active disease. Repeating blood cultures. Given that patient has started to worsen, ok to continue antibiotics.   Plan: Continue zosyn 3.375g IV every 8 hours (extended 4h infusion); consider de-escalation if able  Monitor renal function, culture results, clinical status and resolution of s/sx infection Narrow abx as able and f/u with appropriate duration  Height: '5\' 11"'$  (180.3 cm) Weight: 89.8 kg (198 lb) IBW/kg (Calculated) : 75.3  Temp (24hrs), Avg:99.5 F (37.5 C), Min:97.8 F (36.6 C), Max:101 F (38.3 C)  Recent Labs  Lab 05/21/22 2348 05/22/22 0200 05/22/22 0509 05/22/22 0944 05/22/22 1440 05/23/22 0332 05/23/22 0821 05/24/22 0554 05/25/22 0157 05/26/22 0933 05/28/22 0246  WBC 9.3  --  11.3*  --   --   --   --  20.0* 17.3* 18.5* 16.4*  CREATININE 0.92  --  0.88  --   --  0.87  --  0.79 0.76 0.87 0.78  LATICACIDVEN 4.9* 2.6*  --  4.7* 3.4*  --  1.4  --   --   --   --     Estimated Creatinine Clearance: 105.9 mL/min (by C-G formula based on SCr of 0.78 mg/dL).    No Known Allergies  Microbiology Results 11/25 Bcx: NGTD < 24h  11/20 Ucx: NG final  11/20 MRSA PCR: negative 11/19 resp panel: COVID positive  11/19 Bcx: NG final    Gena Fray, PharmD PGY1 Pharmacy Resident   05/28/2022 10:37 AM

## 2022-05-28 NOTE — Plan of Care (Signed)
  Problem: Clinical Measurements: Goal: Respiratory complications will improve Outcome: Progressing   Problem: Nutrition: Goal: Adequate nutrition will be maintained Outcome: Progressing   Problem: Coping: Goal: Level of anxiety will decrease Outcome: Progressing

## 2022-05-28 NOTE — Progress Notes (Signed)
PROGRESS NOTE    Stephen Bowers  TGP:[id number] DOB: [birth year] DOA: 05/21/2022 PCP: Ladell Pier, MD   Brief Narrative:  HPI: This is a 61 year old male with past medical history hypertension, CKD, dyslipidemia, alcohol use, peptic ulcer disease, thrombocytopenia and liver cirrhosis.  EMS called because the patient was confused, had nausea, vomiting and hematemesis.     On arrival to the ER he was febrile at 103 and COVID-positive.  Imaging showed pneumonia vs aspiration pneumonia.  Patient had lactic acidosis of 4.8, decreased to 2.6 after sepsis bundle IVF bolus.  Imaging also confirms liver cirrhosis.  Platelets 42 (patient has chronic thrombocytopenia).  In the ER patient received 1000 mg of Tylenol, Tmax increased to 104.2 oral.  Antibiotics given includes Rocephin, Flagyl and azithromycin.  Blood cultures x2 and urine culture collected.  80 mg IV Protonix also given.   Patient does not recall the events that brought him here.  Patient states he was simply dizzy.  He endorses drinking 3-4 shots of brandy and 3-4 bottles every weekend..  He states he does not get withdrawal symptoms.    Assessment & Plan:   Principal Problem:   Alcoholic gastritis with bleeding Active Problems:   HTN (hypertension)   Hyperlipidemia   Alcohol use disorder, mild, abuse   Seizure-like activity (HCC)   Thrombocytopenia (HCC)   Alcohol abuse   Anemia of chronic disease   Duodenal ulcer   Pneumonia due to COVID-19 virus   Aspiration pneumonia (HCC)   Lactic acid acidosis   Chronic liver failure (HCC)   Gout   Hematemesis with nausea   Alcoholic cirrhosis of liver without ascites (HCC)  Severe sepsis COVID-19 pneumonia/acute hypoxic respiratory failure secondary to possible aspiration pneumonia: Meets criteria for severe sepsis based on fever, tachycardia, tachypnea and lactic acid of> 2. Chest x-ray unremarkable but follow-up CTA indicated bilateral patchy lung opacities with a confluent  opacity in the right lower lobe consistent with multifocal pneumonia.  Procalcitonin also elevated indicating superimposed bacterial pneumonia.  He was improving but leukocytosis got worse.  Lactic acidosis resolved.  Patient was afebrile for a period of time but started spiking fevers on 05/26/2022, last temperature spike was 101.1 at 4 AM on 05/28/2022.  Interestingly his procalcitonin as well as leukocytosis is improving and he is clinically more alert and oriented today and improving as well.  Will continue Zosyn for now.  He completed remdesivir.  Patient was encouraged to prone, out of bed to chair, to use incentive spirometry and flutter valve.  Repeat blood cultures are negative so far.  History of alcoholic gastritis/?  Hematemesis/anemia of chronic disease: Patient is a poor historian.  Unsure whether he had hematemesis or hemoptysis.  He cannot recall anything at all.  He says that he came in because of the dizziness.  Hemoglobin fairly stable.  GI has seen him, no plans for EGD.  Continue PPI.   Alcoholic liver cirrhosis/coagulopathy: INR slightly elevated at 1.1.  Patient noncompliant, continues to drink, last drink almost 2 days prior to admission.  Will avoid Tylenol.  Slightly elevated LFTs than his baseline, AST much more elevated, ALT is normal.  Indicative of alcoholic liver disease.  Monitor closely.  Alcohol withdrawal: Starting afternoon of 05/24/2022, patient has started having alcohol withdrawal, he was too agitated to be calm down with couple of doses of Haldol as well as Ativan and subsequently he needed to be placed in soft restraints as he was unsteady and was trying to leave.  Improving, CIWA is improving, has received less amount of Ativan in last 24 hours compared to the previous 24 hours.  He is alert and oriented x 2 today.  Hypokalemia: Resolved.  Hypomagnesemia: Resolved.  Acute metabolic encephalopathy: mproving.  Chronic thrombocytopenia: Secondary to liver  cirrhosis and now with sepsis and COVID.  Stable.  Essential hypertension: Controlled.  Continue Norvasc.  DVT prophylaxis: SCDs Start: 05/22/22 0435   Code Status: Full Code  Family Communication:  None present at bedside.  Status is: Inpatient Remains inpatient appropriate because:  withdrawing from alcohol.   Estimated body mass index is 27.62 kg/m as calculated from the following:   Height as of this encounter: '5\' 11"'$  (1.803 m).   Weight as of this encounter: 89.8 kg.    Nutritional Assessment: Body mass index is 27.62 kg/m.[name] Seen by dietician.  I agree with the assessment and plan as outlined below: Nutrition Status:        . Skin Assessment: I have examined the patient's skin and I agree with the wound assessment as performed by the wound care RN as outlined below:    Consultants:  GI  Procedures:  None  Antimicrobials:  Anti-infectives (From admission, onward)    Start     Dose/Rate Route Frequency Ordered Stop   05/23/22 1000  remdesivir 100 mg in sodium chloride 0.9 % 100 mL IVPB       See Hyperspace for full Linked Orders Report.   100 mg 200 mL/hr over 30 Minutes Intravenous Daily 05/22/22 0448 05/24/22 1535   05/22/22 2200  vancomycin (VANCOREADY) IVPB 1250 mg/250 mL  Status:  Discontinued        1,250 mg 166.7 mL/hr over 90 Minutes Intravenous Every 12 hours 05/22/22 0550 05/22/22 1124   05/22/22 1400  ceFEPIme (MAXIPIME) 2 g in sodium chloride 0.9 % 100 mL IVPB  Status:  Discontinued        2 g 200 mL/hr over 30 Minutes Intravenous Every 8 hours 05/22/22 0550 05/22/22 1125   05/22/22 1400  piperacillin-tazobactam (ZOSYN) IVPB 3.375 g        3.375 g 12.5 mL/hr over 240 Minutes Intravenous Every 8 hours 05/22/22 1137     05/22/22 0630  remdesivir 200 mg in sodium chloride 0.9% 250 mL IVPB       See Hyperspace for full Linked Orders Report.   200 mg 580 mL/hr over 30 Minutes Intravenous Once 05/22/22 0448 05/22/22 0651   05/22/22 0630  vancomycin  (VANCOREADY) IVPB 1500 mg/300 mL        1,500 mg 150 mL/hr over 120 Minutes Intravenous  Once 05/22/22 0550 05/22/22 0935   05/22/22 0200  azithromycin (ZITHROMAX) 500 mg in sodium chloride 0.9 % 250 mL IVPB  Status:  Discontinued        500 mg 250 mL/hr over 60 Minutes Intravenous Every 24 hours 05/22/22 0159 05/22/22 0541   05/22/22 0015  cefTRIAXone (ROCEPHIN) 2 g in sodium chloride 0.9 % 100 mL IVPB        2 g 200 mL/hr over 30 Minutes Intravenous  Once 05/22/22 0000 05/22/22 0105   05/22/22 0015  metroNIDAZOLE (FLAGYL) IVPB 500 mg  Status:  Discontinued        500 mg 100 mL/hr over 60 Minutes Intravenous Every 12 hours 05/22/22 0000 05/22/22 0542         Subjective:  Patient seen and examined.  He was alert and oriented x 2.  He had no complaints.  Objective: Vitals:  05/28/22 0400 05/28/22 0540 05/28/22 0800 05/28/22 0900  BP: (!) 140/88 132/76 121/77   Pulse: 99 94 95   Resp: (!) 35 (!) 24 (!) 32 (!) 24  Temp: (!) 101 F (38.3 C) 99.2 F (37.3 C) 99 F (37.2 C)   TempSrc: Axillary Oral Oral   SpO2: 100% 100% 100%   Weight:      Height:        Intake/Output Summary (Last 24 hours) at 05/28/2022 1050 Last data filed at 05/28/2022 0400 Gross per 24 hour  Intake 300 ml  Output 1150 ml  Net -850 ml    Filed Weights   05/21/22 2346  Weight: 89.8 kg    Examination:  General exam: Appears calm and comfortable  Respiratory system: Clear to auscultation. Respiratory effort normal. Cardiovascular system: S1 & S2 heard, RRR. No JVD, murmurs, rubs, gallops or clicks. No pedal edema. Gastrointestinal system: Abdomen is nondistended, soft and nontender. No organomegaly or masses felt. Normal bowel sounds heard. Central nervous system: Alert and oriented x 2. No focal neurological deficits. Extremities: Symmetric 5 x 5 power. Skin: No rashes, lesions or ulcers.    Data Reviewed: I have personally reviewed following labs and imaging studies  CBC: Recent Labs   Lab 05/22/22 0509 05/22/22 1826 05/23/22 0332 05/24/22 0554 05/25/22 0157 05/26/22 0933 05/28/22 0246  WBC 11.3*  --   --  20.0* 17.3* 18.5* 16.4*  NEUTROABS 9.8*  --   --  17.1* 13.4* 14.8* 13.0*  HGB 8.2*   < > 7.9* 8.3* 8.3* 8.7* 7.9*  HCT 24.7*   < > 23.6* 24.3* 25.4* 27.0* 24.7*  MCV 84.9  --   --  83.2 84.1 84.9 86.7  PLT 39*  --   --  58* 79* 127* 246   < > = values in this interval not displayed.    Basic Metabolic Panel: Recent Labs  Lab 05/22/22 0509 05/23/22 0332 05/24/22 0554 05/25/22 0157 05/26/22 0933 05/28/22 0246  NA 139 134* 134* 134* 135 137  K 3.4* 3.5 3.4* 3.8 3.5 3.2*  CL 107 104 101 99 98 103  CO2 17* 18* 17* 18* 19* 22  GLUCOSE 137* 102* 92 102* 124* 110*  BUN '7 6 11 8 11 8  '$ CREATININE 0.88 0.87 0.79 0.76 0.87 0.78  CALCIUM 7.6* 7.9* 8.4* 9.0 9.0 8.9  MG 1.2* 2.0  --   --   --   --   PHOS 3.2  --   --   --   --   --     GFR: Estimated Creatinine Clearance: 105.9 mL/min (by C-G formula based on SCr of 0.78 mg/dL). Liver Function Tests: Recent Labs  Lab 05/21/22 2348 05/22/22 0509 05/23/22 0332 05/24/22 0554 05/26/22 0933  AST 195* 191* 131* 110* 83*  ALT 44 41 38 36 35  ALKPHOS 32* 27* 27* 35* 38  BILITOT 0.3 0.5 1.2 1.2 1.3*  PROT 8.2* 7.4 7.0 7.4 7.8  ALBUMIN 3.7 3.2* 2.9* 3.2* 3.1*    Recent Labs  Lab 05/21/22 2348  LIPASE 27    Recent Labs  Lab 05/22/22 0509  AMMONIA 28    Coagulation Profile: Recent Labs  Lab 05/21/22 2348 05/22/22 0509  INR 1.3* 1.3*    Cardiac Enzymes: No results for input(s): "CKTOTAL", "CKMB", "CKMBINDEX", "TROPONINI" in the last 168 hours. BNP (last 3 results) No results for input(s): "PROBNP" in the last 8760 hours. HbA1C: No results for input(s): "HGBA1C" in the last 72 hours. CBG: Recent  Labs  Lab 05/24/22 1205  GLUCAP 130*    Lipid Profile: No results for input(s): "CHOL", "HDL", "LDLCALC", "TRIG", "CHOLHDL", "LDLDIRECT" in the last 72 hours.  Thyroid Function Tests: No  results for input(s): "TSH", "T4TOTAL", "FREET4", "T3FREE", "THYROIDAB" in the last 72 hours. Anemia Panel: No results for input(s): "VITAMINB12", "FOLATE", "FERRITIN", "TIBC", "IRON", "RETICCTPCT" in the last 72 hours.  Sepsis Labs: Recent Labs  Lab 05/22/22 0200 05/22/22 0944 05/22/22 1440 05/23/22 0821 05/24/22 1205 05/26/22 0933 05/28/22 0246  PROCALCITON  --  11.59  --   --  7.69 2.08 1.15  LATICACIDVEN 2.6* 4.7* 3.4* 1.4  --   --   --      Recent Results (from the past 240 hour(s))  Resp Panel by RT-PCR (Flu A&B, Covid)     Status: Abnormal   Collection Time: 05/21/22 12:02 AM   Specimen: Nasal Swab  Result Value Ref Range Status   SARS Coronavirus 2 by RT PCR POSITIVE (A) NEGATIVE Final    Comment: (NOTE) SARS-CoV-2 target nucleic acids are DETECTED.  The SARS-CoV-2 RNA is generally detectable in upper respiratory specimens during the acute phase of infection. Positive results are indicative of the presence of the identified virus, but do not rule out bacterial infection or co-infection with other pathogens not detected by the test. Clinical correlation with patient history and other diagnostic information is necessary to determine patient infection status. The expected result is Negative.  Fact Sheet for Patients: EntrepreneurPulse.com.au  Fact Sheet for Healthcare Providers: IncredibleEmployment.be  This test is not yet approved or cleared by the [place] FDA and  has been authorized for detection and/or diagnosis of SARS-CoV-2 by FDA under an Emergency Use Authorization (EUA).  This EUA will remain in effect (meaning this test can be used) for the duration of  the COVID-19 declaration under Section 564(b)(1) of the A ct, 21 U.S.C. section 360bbb-3(b)(1), unless the authorization is terminated or revoked sooner.     Influenza A by PCR NEGATIVE NEGATIVE Final   Influenza B by PCR NEGATIVE NEGATIVE Final    Comment:  (NOTE) The Xpert Xpress SARS-CoV-2/FLU/RSV plus assay is intended as an aid in the diagnosis of influenza from Nasopharyngeal swab specimens and should not be used as a sole basis for treatment. Nasal washings and aspirates are unacceptable for Xpert Xpress SARS-CoV-2/FLU/RSV testing.  Fact Sheet for Patients: EntrepreneurPulse.com.au  Fact Sheet for Healthcare Providers: IncredibleEmployment.be  This test is not yet approved or cleared by the [place] FDA and has been authorized for detection and/or diagnosis of SARS-CoV-2 by FDA under an Emergency Use Authorization (EUA). This EUA will remain in effect (meaning this test can be used) for the duration of the COVID-19 declaration under Section 564(b)(1) of the Act, 21 U.S.C. section 360bbb-3(b)(1), unless the authorization is terminated or revoked.  Performed at [facility], [place] [address] [place], [place] [address]   Blood culture (routine x 2)     Status: None   Collection Time: 05/21/22 11:56 PM   Specimen: BLOOD  Result Value Ref Range Status   Specimen Description BLOOD SITE NOT SPECIFIED  Final   Special Requests   Final    BOTTLES DRAWN AEROBIC AND ANAEROBIC Blood Culture results may not be optimal due to an excessive volume of blood received in culture bottles   Culture   Final    NO GROWTH 5 DAYS Performed at [facility], [place] [address], [place], [place] [address]    Report Status  05/27/2022 FINAL  Final  Blood culture (routine x 2)     Status: None   Collection Time: 05/21/22 11:56 PM   Specimen: BLOOD  Result Value Ref Range Status   Specimen Description BLOOD SITE NOT SPECIFIED  Final   Special Requests   Final    BOTTLES DRAWN AEROBIC AND ANAEROBIC Blood Culture results may not be optimal due to an inadequate volume of blood received in culture bottles   Culture   Final    NO GROWTH 5 DAYS Performed at [facility], [place] [address], [place],  [place] [address]    Report Status 05/27/2022 FINAL  Final  Urine Culture     Status: None   Collection Time: 05/22/22  4:49 AM   Specimen: Urine, Clean Catch  Result Value Ref Range Status   Specimen Description URINE, CLEAN CATCH  Final   Special Requests NONE  Final   Culture   Final    NO GROWTH Performed at [facility], [place] [address], [place], [place] [address]    Report Status 05/23/2022 FINAL  Final  MRSA Next Gen by PCR, Nasal     Status: None   Collection Time: 05/22/22  7:33 AM   Specimen: Nasal Mucosa; Nasal Swab  Result Value Ref Range Status   MRSA by PCR Next Gen NOT DETECTED NOT DETECTED Final    Comment: (NOTE) The GeneXpert MRSA Assay (FDA approved for NASAL specimens only), is one component of a comprehensive MRSA colonization surveillance program. It is not intended to diagnose MRSA infection nor to guide or monitor treatment for MRSA infections. Test performance is not FDA approved in patients less than 44 years old. Performed at [facility], [place] [address], [place], [place] [id number]   Culture, blood (Routine X 2) w Reflex to ID Panel     Status: None (Preliminary result)   Collection Time: 05/27/22  8:08 AM   Specimen: BLOOD  Result Value Ref Range Status   Specimen Description BLOOD LEFT ANTECUBITAL  Final   Special Requests   Final    BOTTLES DRAWN AEROBIC AND ANAEROBIC Blood Culture results may not be optimal due to an inadequate volume of blood received in culture bottles   Culture   Final    NO GROWTH < 24 HOURS Performed at [facility], [place] [address] [place], [place] [address]    Report Status PENDING  Incomplete  Culture, blood (Routine X 2) w Reflex to ID Panel     Status: None (Preliminary result)   Collection Time: 05/27/22  8:11 AM   Specimen: BLOOD LEFT WRIST  Result Value Ref Range Status   Specimen Description BLOOD LEFT WRIST  Final   Special Requests   Final    BOTTLES DRAWN AEROBIC AND ANAEROBIC Blood Culture results  may not be optimal due to an inadequate volume of blood received in culture bottles   Culture   Final    NO GROWTH < 24 HOURS Performed at [facility], [place] [address] [place], [place] [address]    Report Status PENDING  Incomplete     Radiology Studies: DG CHEST PORT 1 VIEW  Result Date: 05/27/2022 CLINICAL DATA:  Pneumonia. EXAM: PORTABLE CHEST 1 VIEW COMPARISON:  May 22, 2022. FINDINGS: The heart size and mediastinal contours are within normal limits. Both lungs are clear. The visualized skeletal structures are unremarkable. IMPRESSION: No active disease. Electronically Signed   By: Bobbe Medico.D.  On: 05/27/2022 09:17    Scheduled Meds:  amLODipine  10 mg Oral Daily   folic acid  1 mg Oral Daily   multivitamin with minerals  1 tablet Oral Daily   pantoprazole  40 mg Oral BID   potassium chloride  40 mEq Oral Q4H   thiamine  100 mg Oral Daily   Or   thiamine  100 mg Intravenous Daily   Continuous Infusions:  sodium chloride 100 mL/hr at 05/27/22 2052   piperacillin-tazobactam (ZOSYN)  IV 3.375 g (05/28/22 0539)     LOS: 6 days   Darliss Cheney, MD [facility]  05/28/2022, 10:50 AM   *Please note that this is a verbal dictation therefore any spelling or grammatical errors are due to the "[place]" system interpretation.  Please page via [place] and do not message via secure chat for urgent patient care matters. Secure chat can be used for non urgent patient care matters.  How to contact the [facility] Attending or Consulting provider Caroleen or covering provider during after hours [place], for this patient?  Check the care team in [facility] and look for a) attending/consulting [facility] provider listed and b) the [facility] team listed. Page or secure chat [phone]. Log into [email or web address] and use [place]'s universal password to access. If you do not have the password, please contact the hospital operator. Locate the [facility] provider you are looking for under [facility] and page to a number that you can be directly reached. If you still have difficulty reaching the provider, please page the [facility] (Director on Call) for the Hospitalists listed on amion for assistance.

## 2022-05-29 LAB — CBC WITH DIFFERENTIAL/PLATELET
Abs Immature Granulocytes: 0.3 10*3/uL — ABNORMAL HIGH (ref 0.00–0.07)
Basophils Absolute: 0.1 10*3/uL (ref 0.0–0.1)
Basophils Relative: 1 %
Eosinophils Absolute: 0.1 10*3/uL (ref 0.0–0.5)
Eosinophils Relative: 1 %
HCT: 22.2 % — ABNORMAL LOW (ref 39.0–52.0)
Hemoglobin: 7.6 g/dL — ABNORMAL LOW (ref 13.0–17.0)
Immature Granulocytes: 2 %
Lymphocytes Relative: 8 %
Lymphs Abs: 1.1 10*3/uL (ref 0.7–4.0)
MCH: 28 pg (ref 26.0–34.0)
MCHC: 34.2 g/dL (ref 30.0–36.0)
MCV: 81.9 fL (ref 80.0–100.0)
Monocytes Absolute: 1.5 10*3/uL — ABNORMAL HIGH (ref 0.1–1.0)
Monocytes Relative: 12 %
Neutro Abs: 9.6 10*3/uL — ABNORMAL HIGH (ref 1.7–7.7)
Neutrophils Relative %: 76 %
Platelets: 324 10*3/uL (ref 150–400)
RBC: 2.71 MIL/uL — ABNORMAL LOW (ref 4.22–5.81)
RDW: 17 % — ABNORMAL HIGH (ref 11.5–15.5)
WBC: 12.6 10*3/uL — ABNORMAL HIGH (ref 4.0–10.5)
nRBC: 0 % (ref 0.0–0.2)

## 2022-05-29 LAB — COMPREHENSIVE METABOLIC PANEL
ALT: 37 U/L (ref 0–44)
AST: 70 U/L — ABNORMAL HIGH (ref 15–41)
Albumin: 2.4 g/dL — ABNORMAL LOW (ref 3.5–5.0)
Alkaline Phosphatase: 37 U/L — ABNORMAL LOW (ref 38–126)
Anion gap: 9 (ref 5–15)
BUN: 6 mg/dL (ref 6–20)
CO2: 20 mmol/L — ABNORMAL LOW (ref 22–32)
Calcium: 8.5 mg/dL — ABNORMAL LOW (ref 8.9–10.3)
Chloride: 105 mmol/L (ref 98–111)
Creatinine, Ser: 0.74 mg/dL (ref 0.61–1.24)
GFR, Estimated: >60 mL/min (ref >60)
Glucose, Bld: 138 mg/dL — ABNORMAL HIGH (ref 70–99)
Potassium: 3.3 mmol/L — ABNORMAL LOW (ref 3.5–5.1)
Sodium: 134 mmol/L — ABNORMAL LOW (ref 135–145)
Total Bilirubin: 0.8 mg/dL (ref 0.3–1.2)
Total Protein: 7.2 g/dL (ref 6.5–8.1)

## 2022-05-29 MED ORDER — LORAZEPAM 2 MG/ML IJ SOLN: 1.0000 mg | INTRAMUSCULAR | Status: AC | PRN

## 2022-05-29 MED ORDER — LORAZEPAM 1 MG PO TABS: 1.0000 mg | ORAL_TABLET | ORAL | Status: AC | PRN

## 2022-05-29 MED ORDER — FOLIC ACID 1 MG PO TABS
1.0000 mg | ORAL_TABLET | Freq: Every day | ORAL | Status: DC
  Administered 2022-05-30 – 2022-06-01 (×3): 1 mg via ORAL
  Filled 2022-05-29 (×3): qty 1

## 2022-05-29 MED ORDER — THIAMINE MONONITRATE 100 MG PO TABS
100.0000 mg | ORAL_TABLET | Freq: Every day | ORAL | Status: DC
  Administered 2022-05-29 – 2022-05-31 (×3): 100 mg via ORAL
  Filled 2022-05-29 (×2): qty 1

## 2022-05-29 MED ORDER — THIAMINE HCL 100 MG/ML IJ SOLN
100.0000 mg | Freq: Every day | INTRAMUSCULAR | Status: DC
  Filled 2022-05-29: qty 2

## 2022-05-29 MED ORDER — ADULT MULTIVITAMIN W/MINERALS CH
1.0000 | ORAL_TABLET | Freq: Every day | ORAL | Status: DC
  Administered 2022-05-30 – 2022-06-01 (×3): 1 via ORAL
  Filled 2022-05-29 (×3): qty 1

## 2022-05-29 MED ORDER — ADULT MULTIVITAMIN W/MINERALS CH: 1.0000 | ORAL_TABLET | Freq: Every day | ORAL | Status: DC

## 2022-05-29 MED ORDER — FOLIC ACID 1 MG PO TABS: 1.0000 mg | ORAL_TABLET | Freq: Every day | ORAL | Status: DC

## 2022-05-29 NOTE — Progress Notes (Signed)
TRH night cross cover note:   I was notified by RN that the patient is appearing more anxious/agitated this evening. Patient was previously on prn ativan, which was d\c'ed earlier today, noting most recent prior dose of Ativan was administered on the AM on 05/28/22 . I subsequently placed order for Ativan 0.5 mg IV Q4h prn for anxiety/agitation x 2 doses.     Babs Bertin, DO Hospitalist

## 2022-05-29 NOTE — Progress Notes (Signed)
CSW notes change in PT recommendation to SNF. Current barrier includes lack of insurance and agitation.   Gilmore Laroche, MSW, [facility]

## 2022-05-29 NOTE — Progress Notes (Signed)
PROGRESS NOTE    Stephen Bowers  QHU:[id number] DOB: 1961-05-12 DOA: 05/21/2022 PCP: Ladell Pier, MD   Brief Narrative:  HPI: This is a 61 year old male with past medical history hypertension, CKD, dyslipidemia, alcohol use, peptic ulcer disease, thrombocytopenia and liver cirrhosis.  EMS called because the patient was confused, had nausea, vomiting and hematemesis.     On arrival to the ER he was febrile at 103 and COVID-positive.  Imaging showed pneumonia vs aspiration pneumonia.  Patient had lactic acidosis of 4.8, decreased to 2.6 after sepsis bundle IVF bolus.  Imaging also confirms liver cirrhosis.  Platelets 42 (patient has chronic thrombocytopenia).  In the ER patient received 1000 mg of Tylenol, Tmax increased to 104.2 oral.  Antibiotics given includes Rocephin, Flagyl and azithromycin.  Blood cultures x2 and urine culture collected.  80 mg IV Protonix also given.   Patient does not recall the events that brought him here.  Patient states he was simply dizzy.  He endorses drinking 3-4 shots of brandy and 3-4 bottles every weekend..  He states he does not get withdrawal symptoms.    Assessment & Plan:   Principal Problem:   Alcoholic gastritis with bleeding Active Problems:   HTN (hypertension)   Hyperlipidemia   Alcohol use disorder, mild, abuse   Seizure-like activity (HCC)   Thrombocytopenia (HCC)   Alcohol abuse   Anemia of chronic disease   Duodenal ulcer   Pneumonia due to COVID-19 virus   Aspiration pneumonia (HCC)   Lactic acid acidosis   Chronic liver failure (HCC)   Gout   Hematemesis with nausea   Alcoholic cirrhosis of liver without ascites (HCC)  Severe sepsis COVID-19 pneumonia/acute hypoxic respiratory failure secondary to possible aspiration pneumonia: Meets criteria for severe sepsis based on fever, tachycardia, tachypnea and lactic acid of> 2. Chest x-ray unremarkable but follow-up CTA indicated bilateral patchy lung opacities with a confluent  opacity in the right lower lobe consistent with multifocal pneumonia.  Procalcitonin also elevated indicating superimposed bacterial pneumonia.  He was improving but leukocytosis got worse.  Lactic acidosis resolved.  Patient was afebrile for a period of time but started spiking fevers on 05/26/2022, last temperature spike was 100.4 at 8 PM on 05/28/2022, since his procalcitonin as well as leukocytosis is improving and he is clinically improving as well and he is fever frequency and intensity is also fading, we will just continue current Zosyn and hold off to consulting ID for the moment.  He already completed remdesivir.  He is also on room air currently.   History of alcoholic gastritis/?  Hematemesis/anemia of chronic disease: Patient is a poor historian.  Unsure whether he had hematemesis or hemoptysis.  He cannot recall anything at all.  He says that he came in because of the dizziness.  Hemoglobin fairly stable.  GI has seen him, no plans for EGD.  Continue PPI.   Alcoholic liver cirrhosis/coagulopathy: INR slightly elevated at 1.1.  Patient noncompliant, continues to drink, last drink almost 2 days prior to admission.  Will avoid Tylenol.  Slightly elevated LFTs than his baseline, AST much more elevated, ALT is normal.  Indicative of alcoholic liver disease.  Monitor closely.  Alcohol withdrawal: Starting afternoon of 05/24/2022, patient has started having alcohol withdrawal, he was too agitated to be calm down with couple of doses of Haldol as well as Ativan and subsequently he needed to be placed in soft restraints as he was unsteady and was trying to leave.  Improving, CIWA is improving,  will resume CIWA protocol with as needed Ativan for 1 more day.  I have advised primary RN to remove his hand mittens now that he is doing much better and to observe him.  Hypokalemia: Will replace.  Hypomagnesemia: Resolved.  Acute metabolic encephalopathy: mproving.  Chronic thrombocytopenia: Secondary to  liver cirrhosis and now with sepsis and COVID.  Now within normal range.  Essential hypertension: Controlled.  Continue Norvasc.  DVT prophylaxis: SCDs Start: 05/22/22 0435   Code Status: Full Code  Family Communication:  None present at bedside.  Status is: Inpatient Remains inpatient appropriate because:  withdrawing from alcohol.  But improving.   Estimated body mass index is 27.62 kg/m as calculated from the following:   Height as of this encounter: '5\' 11"'$  (1.803 m).   Weight as of this encounter: 89.8 kg.    Nutritional Assessment: Body mass index is 27.62 kg/m.[name] Seen by dietician.  I agree with the assessment and plan as outlined below: Nutrition Status:        . Skin Assessment: I have examined the patient's skin and I agree with the wound assessment as performed by the wound care RN as outlined below:    Consultants:  GI  Procedures:  None  Antimicrobials:  Anti-infectives (From admission, onward)    Start     Dose/Rate Route Frequency Ordered Stop   05/23/22 1000  remdesivir 100 mg in sodium chloride 0.9 % 100 mL IVPB       See Hyperspace for full Linked Orders Report.   100 mg 200 mL/hr over 30 Minutes Intravenous Daily 05/22/22 0448 05/24/22 1535   05/22/22 2200  vancomycin (VANCOREADY) IVPB 1250 mg/250 mL  Status:  Discontinued        1,250 mg 166.7 mL/hr over 90 Minutes Intravenous Every 12 hours 05/22/22 0550 05/22/22 1124   05/22/22 1400  ceFEPIme (MAXIPIME) 2 g in sodium chloride 0.9 % 100 mL IVPB  Status:  Discontinued        2 g 200 mL/hr over 30 Minutes Intravenous Every 8 hours 05/22/22 0550 05/22/22 1125   05/22/22 1400  piperacillin-tazobactam (ZOSYN) IVPB 3.375 g        3.375 g 12.5 mL/hr over 240 Minutes Intravenous Every 8 hours 05/22/22 1137     05/22/22 0630  remdesivir 200 mg in sodium chloride 0.9% 250 mL IVPB       See Hyperspace for full Linked Orders Report.   200 mg 580 mL/hr over 30 Minutes Intravenous Once 05/22/22 0448  05/22/22 0651   05/22/22 0630  vancomycin (VANCOREADY) IVPB 1500 mg/300 mL        1,500 mg 150 mL/hr over 120 Minutes Intravenous  Once 05/22/22 0550 05/22/22 0935   05/22/22 0200  azithromycin (ZITHROMAX) 500 mg in sodium chloride 0.9 % 250 mL IVPB  Status:  Discontinued        500 mg 250 mL/hr over 60 Minutes Intravenous Every 24 hours 05/22/22 0159 05/22/22 0541   05/22/22 0015  cefTRIAXone (ROCEPHIN) 2 g in sodium chloride 0.9 % 100 mL IVPB        2 g 200 mL/hr over 30 Minutes Intravenous  Once 05/22/22 0000 05/22/22 0105   05/22/22 0015  metroNIDAZOLE (FLAGYL) IVPB 500 mg  Status:  Discontinued        500 mg 100 mL/hr over 60 Minutes Intravenous Every 12 hours 05/22/22 0000 05/22/22 0542         Subjective:  Patient seen and examined.  He is alert, oriented  x 2.  Has no complaint.  Objective: Vitals:   05/28/22 2000 05/28/22 2352 05/29/22 0400 05/29/22 0800  BP: (!) 141/89 (!) 145/85 (!) 141/88 (!) 154/88  Pulse: (!) 104 100 93 98  Resp: (!) '22 20 19 20  '$ Temp: (!) 100.4 F (38 C) 99.6 F (37.6 C) 99.9 F (37.7 C) 99 F (37.2 C)  TempSrc: Oral Oral Axillary Axillary  SpO2: 100% 97% 100% 98%  Weight:      Height:        Intake/Output Summary (Last 24 hours) at 05/29/2022 1127 Last data filed at 05/29/2022 0837 Gross per 24 hour  Intake 420 ml  Output 2075 ml  Net -1655 ml    Filed Weights   05/21/22 2346  Weight: 89.8 kg    Examination:  General exam: Appears calm and comfortable  Respiratory system: Clear to auscultation. Respiratory effort normal. Cardiovascular system: S1 & S2 heard, RRR. No JVD, murmurs, rubs, gallops or clicks. No pedal edema. Gastrointestinal system: Abdomen is nondistended, soft and nontender. No organomegaly or masses felt. Normal bowel sounds heard. Central nervous system: Alert and oriented x 2. No focal neurological deficits. Extremities: Symmetric 5 x 5 power. Skin: No rashes, lesions or ulcers.  Psychiatry: Judgement and  insight appear normal. Mood & affect appropriate.   Data Reviewed: I have personally reviewed following labs and imaging studies  CBC: Recent Labs  Lab 05/24/22 0554 05/25/22 0157 05/26/22 0933 05/28/22 0246 05/29/22 0906  WBC 20.0* 17.3* 18.5* 16.4* 12.6*  NEUTROABS 17.1* 13.4* 14.8* 13.0* 9.6*  HGB 8.3* 8.3* 8.7* 7.9* 7.6*  HCT 24.3* 25.4* 27.0* 24.7* 22.2*  MCV 83.2 84.1 84.9 86.7 81.9  PLT 58* 79* 127* 246 409    Basic Metabolic Panel: Recent Labs  Lab 05/23/22 0332 05/24/22 0554 05/25/22 0157 05/26/22 0933 05/28/22 0246 05/29/22 0906  NA 134* 134* 134* 135 137 134*  K 3.5 3.4* 3.8 3.5 3.2* 3.3*  CL 104 101 99 98 103 105  CO2 18* 17* 18* 19* 22 20*  GLUCOSE 102* 92 102* 124* 110* 138*  BUN '6 11 8 11 8 6  '$ CREATININE 0.87 0.79 0.76 0.87 0.78 0.74  CALCIUM 7.9* 8.4* 9.0 9.0 8.9 8.5*  MG 2.0  --   --   --   --   --     GFR: Estimated Creatinine Clearance: 105.9 mL/min (by C-G formula based on SCr of 0.74 mg/dL). Liver Function Tests: Recent Labs  Lab 05/23/22 0332 05/24/22 0554 05/26/22 0933 05/29/22 0906  AST 131* 110* 83* 70*  ALT 38 36 35 37  ALKPHOS 27* 35* 38 37*  BILITOT 1.2 1.2 1.3* 0.8  PROT 7.0 7.4 7.8 7.2  ALBUMIN 2.9* 3.2* 3.1* 2.4*    No results for input(s): "LIPASE", "AMYLASE" in the last 168 hours.  No results for input(s): "AMMONIA" in the last 168 hours.  Coagulation Profile: No results for input(s): "INR", "PROTIME" in the last 168 hours.  Cardiac Enzymes: No results for input(s): "CKTOTAL", "CKMB", "CKMBINDEX", "TROPONINI" in the last 168 hours. BNP (last 3 results) No results for input(s): "PROBNP" in the last 8760 hours. HbA1C: No results for input(s): "HGBA1C" in the last 72 hours. CBG: Recent Labs  Lab 05/24/22 1205  GLUCAP 130*    Lipid Profile: No results for input(s): "CHOL", "HDL", "LDLCALC", "TRIG", "CHOLHDL", "LDLDIRECT" in the last 72 hours.  Thyroid Function Tests: No results for input(s): "TSH",  "T4TOTAL", "FREET4", "T3FREE", "THYROIDAB" in the last 72 hours. Anemia Panel: No results for  input(s): "VITAMINB12", "FOLATE", "FERRITIN", "TIBC", "IRON", "RETICCTPCT" in the last 72 hours.  Sepsis Labs: Recent Labs  Lab 05/22/22 1440 05/23/22 0821 05/24/22 1205 05/26/22 0933 05/28/22 0246  PROCALCITON  --   --  7.69 2.08 1.15  LATICACIDVEN 3.4* 1.4  --   --   --      Recent Results (from the past 240 hour(s))  Resp Panel by RT-PCR (Flu A&B, Covid)     Status: Abnormal   Collection Time: 05/21/22 12:02 AM   Specimen: Nasal Swab  Result Value Ref Range Status   SARS Coronavirus 2 by RT PCR POSITIVE (A) NEGATIVE Final    Comment: (NOTE) SARS-CoV-2 target nucleic acids are DETECTED.  The SARS-CoV-2 RNA is generally detectable in upper respiratory specimens during the acute phase of infection. Positive results are indicative of the presence of the identified virus, but do not rule out bacterial infection or co-infection with other pathogens not detected by the test. Clinical correlation with patient history and other diagnostic information is necessary to determine patient infection status. The expected result is Negative.  Fact Sheet for Patients: EntrepreneurPulse.com.au  Fact Sheet for Healthcare Providers: IncredibleEmployment.be  This test is not yet approved or cleared by the [place] FDA and  has been authorized for detection and/or diagnosis of SARS-CoV-2 by FDA under an Emergency Use Authorization (EUA).  This EUA will remain in effect (meaning this test can be used) for the duration of  the COVID-19 declaration under Section 564(b)(1) of the A ct, 21 U.S.C. section 360bbb-3(b)(1), unless the authorization is terminated or revoked sooner.     Influenza A by PCR NEGATIVE NEGATIVE Final   Influenza B by PCR NEGATIVE NEGATIVE Final    Comment: (NOTE) The Xpert Xpress SARS-CoV-2/FLU/RSV plus assay is intended as an aid in  the diagnosis of influenza from Nasopharyngeal swab specimens and should not be used as a sole basis for treatment. Nasal washings and aspirates are unacceptable for Xpert Xpress SARS-CoV-2/FLU/RSV testing.  Fact Sheet for Patients: EntrepreneurPulse.com.au  Fact Sheet for Healthcare Providers: IncredibleEmployment.be  This test is not yet approved or cleared by the [place] FDA and has been authorized for detection and/or diagnosis of SARS-CoV-2 by FDA under an Emergency Use Authorization (EUA). This EUA will remain in effect (meaning this test can be used) for the duration of the COVID-19 declaration under Section 564(b)(1) of the Act, 21 U.S.C. section 360bbb-3(b)(1), unless the authorization is terminated or revoked.  Performed at [facility], [place] [address] [place], [place] [address]   Blood culture (routine x 2)     Status: None   Collection Time: 05/21/22 11:56 PM   Specimen: BLOOD  Result Value Ref Range Status   Specimen Description BLOOD SITE NOT SPECIFIED  Final   Special Requests   Final    BOTTLES DRAWN AEROBIC AND ANAEROBIC Blood Culture results may not be optimal due to an excessive volume of blood received in culture bottles   Culture   Final    NO GROWTH 5 DAYS Performed at [facility], [place] [address] [place], [place] [address]    Report Status 05/27/2022 FINAL  Final  Blood culture (routine x 2)     Status: None   Collection Time: 05/21/22 11:56 PM   Specimen: BLOOD  Result Value Ref Range Status   Specimen Description BLOOD SITE NOT SPECIFIED  Final   Special Requests   Final    BOTTLES DRAWN AEROBIC AND ANAEROBIC Blood Culture results may  not be optimal due to an inadequate volume of blood received in culture bottles   Culture   Final    NO GROWTH 5 DAYS Performed at [facility], [place] [address], [place], [place] [address]    Report Status 05/27/2022 FINAL  Final  Urine Culture     Status:  None   Collection Time: 05/22/22  4:49 AM   Specimen: Urine, Clean Catch  Result Value Ref Range Status   Specimen Description URINE, CLEAN CATCH  Final   Special Requests NONE  Final   Culture   Final    NO GROWTH Performed at [facility], [place] [address] [place], [place] [address]    Report Status 05/23/2022 FINAL  Final  MRSA Next Gen by PCR, Nasal     Status: None   Collection Time: 05/22/22  7:33 AM   Specimen: Nasal Mucosa; Nasal Swab  Result Value Ref Range Status   MRSA by PCR Next Gen NOT DETECTED NOT DETECTED Final    Comment: (NOTE) The GeneXpert MRSA Assay (FDA approved for NASAL specimens only), is one component of a comprehensive MRSA colonization surveillance program. It is not intended to diagnose MRSA infection nor to guide or monitor treatment for MRSA infections. Test performance is not FDA approved in patients less than 76 years old. Performed at [facility], [place] [address] [place], [place] [address]   Culture, blood (Routine X 2) w Reflex to ID Panel     Status: None (Preliminary result)   Collection Time: 05/27/22  8:08 AM   Specimen: BLOOD  Result Value Ref Range Status   Specimen Description BLOOD LEFT ANTECUBITAL  Final   Special Requests   Final    BOTTLES DRAWN AEROBIC AND ANAEROBIC Blood Culture results may not be optimal due to an inadequate volume of blood received in culture bottles   Culture   Final    NO GROWTH 2 DAYS Performed at [facility], [place] [address], [place], [place] [address]    Report Status PENDING  Incomplete  Culture, blood (Routine X 2) w Reflex to ID Panel     Status: None (Preliminary result)   Collection Time: 05/27/22  8:11 AM   Specimen: BLOOD LEFT WRIST  Result Value Ref Range Status   Specimen Description BLOOD LEFT WRIST  Final   Special Requests   Final    BOTTLES DRAWN AEROBIC AND ANAEROBIC Blood Culture results may not be optimal due to an inadequate volume of blood received in culture bottles    Culture   Final    NO GROWTH 2 DAYS Performed at [facility], [place] [address] [place],  [address]    Report Status PENDING  Incomplete     Radiology Studies: No results found.  Scheduled Meds:  amLODipine  10 mg Oral Daily   folic acid  1 mg Oral Daily   multivitamin with minerals  1 tablet Oral Daily   pantoprazole  40 mg Oral BID   thiamine  100 mg Oral Daily   Continuous Infusions:  sodium chloride 100 mL/hr at 05/27/22 2052   piperacillin-tazobactam (ZOSYN)  IV 3.375 g (05/29/22 0539)     LOS: 7 days   Darliss Cheney, MD [facility]  05/29/2022, 11:27 AM   *Please note that this is a verbal dictation therefore any spelling or grammatical errors are due to the "[place] [employer]" system interpretation.  Please page via Gumlog and do not message  via secure chat for urgent patient care matters. Secure chat can be used for non urgent patient care matters.  How to contact the [facility] Attending or Consulting provider [place] or covering provider during after hours [place], for this patient?  Check the care team in [facility] and look for a) attending/consulting [facility] provider listed and b) the [facility] team listed. Page or secure chat [phone]. Log into [email or web address] and use [place]'s universal password to access. If you do not have the password, please contact the hospital operator. Locate the [facility] provider you are looking for under [facility] and page to a number that you can be directly reached. If you still have difficulty reaching the provider, please page the [facility] (Director on Call) for the Hospitalists listed on amion for assistance.

## 2022-05-29 NOTE — Progress Notes (Addendum)
Physical Therapy Treatment Patient Details Name: Stephen Bowers MRN: [id number] DOB: 1960/09/03 Today's Date: 05/29/2022   History of Present Illness 61 y.o. male presents to [facility] hospital on 05/21/2022 with AMS, nausea, vomiting, hematemesis. Pt also found to be COVID+. PMH significant for alcohol use disorder, pancreatitis, HTN, CKD, HLD.    PT Comments    Good progress with mobility today. Better with following commands, and balance drastically improved from last visit. Able to stand with min assist, showing posterior LOB intermittently. Ambulates in room min assist for balance and walker control, very slow and shuffled steps, requires frequent VC. Tachycarding into 140s, SpO2 in mid to high 90s on RA. Tolerated LE exercises well, encouraged to continue with exercises between sessions. Reviewed IS use and verb understanding. Patient will continue to benefit from skilled physical therapy services to further improve independence with functional mobility.    Recommendations for follow up therapy are one component of a multi-disciplinary discharge planning process, led by the attending physician.  Recommendations may be updated based on patient status, additional functional criteria and insurance authorization.  Follow Up Recommendations  Skilled nursing-short term rehab (<3 hours/day)     Assistance Recommended at Discharge Frequent or constant Supervision/Assistance  Patient can return home with the following Assistance with cooking/housework;Direct supervision/assist for medications management;Direct supervision/assist for financial management;Assist for transportation;Help with stairs or ramp for entrance;A lot of help with walking and/or transfers;A lot of help with bathing/dressing/bathroom   Equipment Recommendations  None recommended by PT    Recommendations for Other Services       Precautions / Restrictions Precautions Precautions: Fall Restrictions Weight Bearing  Restrictions: No     Mobility  Bed Mobility Overal bed mobility: Needs Assistance Bed Mobility: Supine to Sit, Sit to Supine     Supine to sit: HOB elevated, Min guard Sit to supine: Min guard   General bed mobility comments: Min guard for safety. Slow but improved trunk control today. VC required to sequence OOB. No physical assist required. Assisted with lines/leads    Transfers Overall transfer level: Needs assistance Equipment used: Rolling walker (2 wheels) Transfers: Sit to/from Stand Sit to Stand: From elevated surface, Min assist           General transfer comment: Min assist for boost to stand, showing posterior LOB. Cues for techniques and hand placement. Practiced twice from EOB.    Ambulation/Gait Ambulation/Gait assistance: Min assist Gait Distance (Feet): 20 Feet Assistive device: Rolling walker (2 wheels) Gait Pattern/deviations: Step-through pattern, Shuffle, Decreased stride length Gait velocity: functional Gait velocity interpretation: <1.31 ft/sec, indicative of household ambulator Pre-gait activities: Weight shift, stationary march. General Gait Details: Min assist for stability and RW control. Frequent VC for technique and AD use, proximity to walker with turns. Posterior instability with light assist required to stabilize. Very slow and shuffled. Cues for larger step length.   Stairs             Wheelchair Mobility    Modified Rankin (Stroke Patients Only)       Balance Overall balance assessment: Needs assistance Sitting-balance support: Feet supported, No upper extremity supported Sitting balance-Leahy Scale: Fair     Standing balance support: Reliant on assistive device for balance, Bilateral upper extremity supported Standing balance-Leahy Scale: Poor Standing balance comment: Improved from last visit, but still with posterior LOB requiring assist intermittently.  Cognition  Arousal/Alertness: Awake/alert Behavior During Therapy: WFL for tasks assessed/performed, Flat affect Overall Cognitive Status: Impaired/Different from baseline Area of Impairment: Attention, Safety/judgement, Awareness, Problem solving, Orientation                 Orientation Level: Disoriented to, Time, Situation Current Attention Level: Selective     Safety/Judgement: Decreased awareness of safety, Decreased awareness of deficits Awareness: Emergent Problem Solving: Slow processing, Decreased initiation, Difficulty sequencing, Requires verbal cues General Comments: Improving from last visit. Wrong month (thinks it is december, but aware he is at [facility].)        Exercises General Exercises - Lower Extremity Quad Sets: Strengthening, Both, Supine, 20 reps Gluteal Sets: Strengthening, Both, 15 reps, Supine Straight Leg Raises: Strengthening, Both, 5 reps, Supine Mini-Sqauts: Strengthening, 5 reps, Standing    General Comments General comments (skin integrity, edema, etc.): Tachy 140s at max. Improves upon resting in bed. SpO2 WNL on RA during session.      Pertinent Vitals/Pain Pain Assessment Pain Assessment: No/denies pain    Home Living                          Prior Function            PT Goals (current goals can now be found in the care plan section) Acute Rehab PT Goals Patient Stated Goal: to go home PT Goal Formulation: With patient Time For Goal Achievement: 06/07/22 Potential to Achieve Goals: Good Progress towards PT goals: Progressing toward goals    Frequency    Min 3X/week      PT Plan Current plan remains appropriate    Co-evaluation              AM-PAC PT "6 Clicks" Mobility   Outcome Measure  Help needed turning from your back to your side while in a flat bed without using bedrails?: A Little Help needed moving from lying on your back to sitting on the side of a flat bed without using bedrails?: A Little Help needed  moving to and from a bed to a chair (including a wheelchair)?: A Little Help needed standing up from a chair using your arms (e.g., wheelchair or bedside chair)?: A Little Help needed to walk in hospital room?: A Little Help needed climbing 3-5 steps with a railing? : Total 6 Click Score: 16    End of Session Equipment Utilized During Treatment: Gait belt Activity Tolerance: Patient tolerated treatment well Patient left: with call bell/phone within reach;in bed;with bed alarm set;with SCD's reapplied   PT Visit Diagnosis: Other abnormalities of gait and mobility (R26.89);Difficulty in walking, not elsewhere classified (R26.2);Unsteadiness on feet (R26.81);Other symptoms and signs involving the nervous system (R29.898)     Time: 0177-9390 PT Time Calculation (min) (ACUTE ONLY): 32 min  Charges:  $Gait Training: 8-22 mins $Therapeutic Activity: 8-22 mins                     Candie Mile, PT, DPT Physical Therapist Acute Rehabilitation Services [place] 05/29/2022, 4:00 PM

## 2022-05-29 NOTE — Plan of Care (Signed)

## 2022-05-30 LAB — BASIC METABOLIC PANEL
Anion gap: 9 (ref 5–15)
BUN: <5 mg/dL — ABNORMAL LOW (ref 6–20)
CO2: 23 mmol/L (ref 22–32)
Calcium: 8.4 mg/dL — ABNORMAL LOW (ref 8.9–10.3)
Chloride: 100 mmol/L (ref 98–111)
Creatinine, Ser: 0.6 mg/dL — ABNORMAL LOW (ref 0.61–1.24)
GFR, Estimated: >60 mL/min (ref >60)
Glucose, Bld: 109 mg/dL — ABNORMAL HIGH (ref 70–99)
Potassium: 2.8 mmol/L — ABNORMAL LOW (ref 3.5–5.1)
Sodium: 132 mmol/L — ABNORMAL LOW (ref 135–145)

## 2022-05-30 LAB — CBC WITH DIFFERENTIAL/PLATELET
Abs Immature Granulocytes: 0.14 10*3/uL — ABNORMAL HIGH (ref 0.00–0.07)
Basophils Absolute: 0.1 10*3/uL (ref 0.0–0.1)
Basophils Relative: 1 %
Eosinophils Absolute: 0.1 10*3/uL (ref 0.0–0.5)
Eosinophils Relative: 1 %
HCT: 20.8 % — ABNORMAL LOW (ref 39.0–52.0)
Hemoglobin: 7 g/dL — ABNORMAL LOW (ref 13.0–17.0)
Immature Granulocytes: 1 %
Lymphocytes Relative: 10 %
Lymphs Abs: 1.1 10*3/uL (ref 0.7–4.0)
MCH: 28.1 pg (ref 26.0–34.0)
MCHC: 33.7 g/dL (ref 30.0–36.0)
MCV: 83.5 fL (ref 80.0–100.0)
Monocytes Absolute: 1.3 10*3/uL — ABNORMAL HIGH (ref 0.1–1.0)
Monocytes Relative: 12 %
Neutro Abs: 8.4 10*3/uL — ABNORMAL HIGH (ref 1.7–7.7)
Neutrophils Relative %: 75 %
Platelets: 382 10*3/uL (ref 150–400)
RBC: 2.49 MIL/uL — ABNORMAL LOW (ref 4.22–5.81)
RDW: 17.1 % — ABNORMAL HIGH (ref 11.5–15.5)
WBC: 11.2 10*3/uL — ABNORMAL HIGH (ref 4.0–10.5)
nRBC: 0 % (ref 0.0–0.2)

## 2022-05-30 LAB — MAGNESIUM: Magnesium: 1.4 mg/dL — ABNORMAL LOW (ref 1.7–2.4)

## 2022-05-30 LAB — HEMOGLOBIN AND HEMATOCRIT, BLOOD
HCT: 22.4 % — ABNORMAL LOW (ref 39.0–52.0)
Hemoglobin: 7.5 g/dL — ABNORMAL LOW (ref 13.0–17.0)

## 2022-05-30 LAB — GLUCOSE, CAPILLARY: Glucose-Capillary: 111 mg/dL — ABNORMAL HIGH (ref 70–99)

## 2022-05-30 MED ORDER — MAGNESIUM SULFATE 4 GM/100ML IV SOLN
4.0000 g | Freq: Once | INTRAVENOUS | Status: AC
  Administered 2022-05-30: 4 g via INTRAVENOUS
  Filled 2022-05-30: qty 100

## 2022-05-30 MED ORDER — POTASSIUM CHLORIDE CRYS ER 20 MEQ PO TBCR
40.0000 meq | EXTENDED_RELEASE_TABLET | ORAL | Status: AC
  Administered 2022-05-30 (×3): 40 meq via ORAL
  Filled 2022-05-30 (×3): qty 2

## 2022-05-30 NOTE — Progress Notes (Signed)
PROGRESS NOTE    Stephen Bowers  VEH:[id number] DOB: 08/25/1960 DOA: 05/21/2022 PCP: Ladell Pier, MD   Brief Narrative:  HPI: This is a 61 year old male with past medical history hypertension, CKD, dyslipidemia, alcohol use, peptic ulcer disease, thrombocytopenia and liver cirrhosis.  EMS called because the patient was confused, had nausea, vomiting and hematemesis.     On arrival to the ER he was febrile at 103 and COVID-positive.  Imaging showed pneumonia vs aspiration pneumonia.  Patient had lactic acidosis of 4.8, decreased to 2.6 after sepsis bundle IVF bolus.  Imaging also confirms liver cirrhosis.  Platelets 42 (patient has chronic thrombocytopenia).  In the ER patient received 1000 mg of Tylenol, Tmax increased to 104.2 oral.  Antibiotics given includes Rocephin, Flagyl and azithromycin.  Blood cultures x2 and urine culture collected.  80 mg IV Protonix also given.   Patient does not recall the events that brought him here.  Patient states he was simply dizzy.  He endorses drinking 3-4 shots of brandy and 3-4 bottles every weekend..  He states he does not get withdrawal symptoms.    Assessment & Plan:   Principal Problem:   Alcoholic gastritis with bleeding Active Problems:   HTN (hypertension)   Hyperlipidemia   Alcohol use disorder, mild, abuse   Seizure-like activity (HCC)   Thrombocytopenia (HCC)   Alcohol abuse   Anemia of chronic disease   Duodenal ulcer   Pneumonia due to COVID-19 virus   Aspiration pneumonia (HCC)   Lactic acid acidosis   Chronic liver failure (HCC)   Gout   Hematemesis with nausea   Alcoholic cirrhosis of liver without ascites (HCC)  Severe sepsis COVID-19 pneumonia/acute hypoxic respiratory failure secondary to possible aspiration pneumonia: Meets criteria for severe sepsis based on fever, tachycardia, tachypnea and lactic acid of> 2. Chest x-ray unremarkable but follow-up CTA indicated bilateral patchy lung opacities with a confluent  opacity in the right lower lobe consistent with multifocal pneumonia.  Procalcitonin also elevated indicating superimposed bacterial pneumonia.  He was improving but leukocytosis got worse.  Lactic acidosis resolved.  Patient was afebrile for a period of time but started spiking fevers on 05/26/2022, last temperature spike was 100.4 at 8 PM on 05/28/2022, procalcitonin as well as is improved as well.  Has been afebrile for at least 36 hours.  Has received 7 days of Zosyn as well.  Will discontinue to biotics.  Patient is much more alert and oriented today.  History of alcoholic gastritis/?  Hematemesis/anemia of chronic disease: Patient is a poor historian.  Unsure whether he had hematemesis or hemoptysis.  He cannot recall anything at all.  He says that he came in because of the dizziness.  Hemoglobin fairly stable.  GI has seen him, no plans for EGD.  Continue PPI.   Alcoholic liver cirrhosis/coagulopathy: INR slightly elevated at 1.1.  Patient noncompliant, continues to drink, last drink almost 2 days prior to admission.  Will avoid Tylenol.  Slightly elevated LFTs than his baseline, AST much more elevated, ALT is normal.  Indicative of alcoholic liver disease.  Monitor closely.  Alcohol withdrawal: Starting afternoon of 05/24/2022, patient has started having alcohol withdrawal, he was too agitated to be calm down with couple of doses of Haldol as well as Ativan and subsequently he needed to be placed in soft restraints as he was unsteady and was trying to leave.  Improving, CIWA is improving, and it looks like he has not required any Ativan in last 24 hours.  He is fully alert and oriented as well today.  Looks like he is over the withdrawal now.   Hypokalemia: Low again, will replace.  Hypomagnesemia: Low again, will replace.  Acute metabolic encephalopathy: Fully alert and oriented.  Chronic thrombocytopenia: Secondary to liver cirrhosis and now with sepsis and COVID.  Now within normal  range.  Essential hypertension: Controlled.  Continue Norvasc.  Acute on chronic normocytic anemia: Baseline hemoglobin appears to be around 8-9, hemoglobin has been drifting down and currently 7.0, rechecking hemoglobin at noon, if less than 7, will transfuse.  Recheck labs in the morning.  DVT prophylaxis: SCDs Start: 05/22/22 0435   Code Status: Full Code  Family Communication:  None present at bedside.  Status is: Inpatient Remains inpatient appropriate because: Patient is near discharge now.  May be discharged in 1 to 2 days but currently has anemia.  PT OT recommends SNF.  TOC consulted.   Estimated body mass index is 27.62 kg/m as calculated from the following:   Height as of this encounter: '5\' 11"'$  (1.803 m).   Weight as of this encounter: 89.8 kg.    Nutritional Assessment: Body mass index is 27.62 kg/m.[name] Seen by dietician.  I agree with the assessment and plan as outlined below: Nutrition Status:        . Skin Assessment: I have examined the patient's skin and I agree with the wound assessment as performed by the wound care RN as outlined below:    Consultants:  GI  Procedures:  None  Antimicrobials:  Anti-infectives (From admission, onward)    Start     Dose/Rate Route Frequency Ordered Stop   05/23/22 1000  remdesivir 100 mg in sodium chloride 0.9 % 100 mL IVPB       See Hyperspace for full Linked Orders Report.   100 mg 200 mL/hr over 30 Minutes Intravenous Daily 05/22/22 0448 05/24/22 1535   05/22/22 2200  vancomycin (VANCOREADY) IVPB 1250 mg/250 mL  Status:  Discontinued        1,250 mg 166.7 mL/hr over 90 Minutes Intravenous Every 12 hours 05/22/22 0550 05/22/22 1124   05/22/22 1400  ceFEPIme (MAXIPIME) 2 g in sodium chloride 0.9 % 100 mL IVPB  Status:  Discontinued        2 g 200 mL/hr over 30 Minutes Intravenous Every 8 hours 05/22/22 0550 05/22/22 1125   05/22/22 1400  piperacillin-tazobactam (ZOSYN) IVPB 3.375 g  Status:  Discontinued         3.375 g 12.5 mL/hr over 240 Minutes Intravenous Every 8 hours 05/22/22 1137 05/30/22 1052   05/22/22 0630  remdesivir 200 mg in sodium chloride 0.9% 250 mL IVPB       See Hyperspace for full Linked Orders Report.   200 mg 580 mL/hr over 30 Minutes Intravenous Once 05/22/22 0448 05/22/22 0651   05/22/22 0630  vancomycin (VANCOREADY) IVPB 1500 mg/300 mL        1,500 mg 150 mL/hr over 120 Minutes Intravenous  Once 05/22/22 0550 05/22/22 0935   05/22/22 0200  azithromycin (ZITHROMAX) 500 mg in sodium chloride 0.9 % 250 mL IVPB  Status:  Discontinued        500 mg 250 mL/hr over 60 Minutes Intravenous Every 24 hours 05/22/22 0159 05/22/22 0541   05/22/22 0015  cefTRIAXone (ROCEPHIN) 2 g in sodium chloride 0.9 % 100 mL IVPB        2 g 200 mL/hr over 30 Minutes Intravenous  Once 05/22/22 0000 05/22/22 0105  05/22/22 0015  metroNIDAZOLE (FLAGYL) IVPB 500 mg  Status:  Discontinued        500 mg 100 mL/hr over 60 Minutes Intravenous Every 12 hours 05/22/22 0000 05/22/22 0542         Subjective:  Patient seen and examined.  He has no complaints.  He is mostly alert and oriented today.  He only missed the month and he thought it was February.  Objective: Vitals:   05/29/22 2300 05/30/22 0300 05/30/22 0500 05/30/22 0815  BP: (!) 147/89 (!) 145/93  128/80  Pulse: (!) 103 (!) 103    Resp: (!) 21 (!) 29 20   Temp: 99.4 F (37.4 C) 99.7 F (37.6 C)  98.8 F (37.1 C)  TempSrc: Oral Oral  Oral  SpO2:  97%    Weight:      Height:        Intake/Output Summary (Last 24 hours) at 05/30/2022 1053 Last data filed at 05/30/2022 0817 Gross per 24 hour  Intake 400 ml  Output 3200 ml  Net -2800 ml    Filed Weights   05/21/22 2346  Weight: 89.8 kg    Examination:  General exam: Appears calm and comfortable  Respiratory system: Clear to auscultation. Respiratory effort normal. Cardiovascular system: S1 & S2 heard, RRR. No JVD, murmurs, rubs, gallops or clicks. No pedal  edema. Gastrointestinal system: Abdomen is nondistended, soft and nontender. No organomegaly or masses felt. Normal bowel sounds heard. Central nervous system: Alert and oriented x 2. No focal neurological deficits. Extremities: Symmetric 5 x 5 power. Skin: No rashes, lesions or ulcers.   Data Reviewed: I have personally reviewed following labs and imaging studies  CBC: Recent Labs  Lab 05/25/22 0157 05/26/22 0933 05/28/22 0246 05/29/22 0906 05/30/22 0613  WBC 17.3* 18.5* 16.4* 12.6* 11.2*  NEUTROABS 13.4* 14.8* 13.0* 9.6* 8.4*  HGB 8.3* 8.7* 7.9* 7.6* 7.0*  HCT 25.4* 27.0* 24.7* 22.2* 20.8*  MCV 84.1 84.9 86.7 81.9 83.5  PLT 79* 127* 246 324 725    Basic Metabolic Panel: Recent Labs  Lab 05/25/22 0157 05/26/22 0933 05/28/22 0246 05/29/22 0906 05/30/22 0613  NA 134* 135 137 134* 132*  K 3.8 3.5 3.2* 3.3* 2.8*  CL 99 98 103 105 100  CO2 18* 19* 22 20* 23  GLUCOSE 102* 124* 110* 138* 109*  BUN '8 11 8 6 '$ <5*  CREATININE 0.76 0.87 0.78 0.74 0.60*  CALCIUM 9.0 9.0 8.9 8.5* 8.4*  MG  --   --   --   --  1.4*    GFR: Estimated Creatinine Clearance: 105.9 mL/min (A) (by C-G formula based on SCr of 0.6 mg/dL (L)). Liver Function Tests: Recent Labs  Lab 05/24/22 0554 05/26/22 0933 05/29/22 0906  AST 110* 83* 70*  ALT 36 35 37  ALKPHOS 35* 38 37*  BILITOT 1.2 1.3* 0.8  PROT 7.4 7.8 7.2  ALBUMIN 3.2* 3.1* 2.4*    No results for input(s): "LIPASE", "AMYLASE" in the last 168 hours.  No results for input(s): "AMMONIA" in the last 168 hours.  Coagulation Profile: No results for input(s): "INR", "PROTIME" in the last 168 hours.  Cardiac Enzymes: No results for input(s): "CKTOTAL", "CKMB", "CKMBINDEX", "TROPONINI" in the last 168 hours. BNP (last 3 results) No results for input(s): "PROBNP" in the last 8760 hours. HbA1C: No results for input(s): "HGBA1C" in the last 72 hours. CBG: Recent Labs  Lab 05/24/22 1205  GLUCAP 130*    Lipid Profile: No results for  input(s): "CHOL", "  HDL", "LDLCALC", "TRIG", "CHOLHDL", "LDLDIRECT" in the last 72 hours.  Thyroid Function Tests: No results for input(s): "TSH", "T4TOTAL", "FREET4", "T3FREE", "THYROIDAB" in the last 72 hours. Anemia Panel: No results for input(s): "VITAMINB12", "FOLATE", "FERRITIN", "TIBC", "IRON", "RETICCTPCT" in the last 72 hours.  Sepsis Labs: Recent Labs  Lab 05/24/22 1205 05/26/22 0933 05/28/22 0246  PROCALCITON 7.69 2.08 1.15     Recent Results (from the past 240 hour(s))  Resp Panel by RT-PCR (Flu A&B, Covid)     Status: Abnormal   Collection Time: 05/21/22 12:02 AM   Specimen: Nasal Swab  Result Value Ref Range Status   SARS Coronavirus 2 by RT PCR POSITIVE (A) NEGATIVE Final    Comment: (NOTE) SARS-CoV-2 target nucleic acids are DETECTED.  The SARS-CoV-2 RNA is generally detectable in upper respiratory specimens during the acute phase of infection. Positive results are indicative of the presence of the identified virus, but do not rule out bacterial infection or co-infection with other pathogens not detected by the test. Clinical correlation with patient history and other diagnostic information is necessary to determine patient infection status. The expected result is Negative.  Fact Sheet for Patients: EntrepreneurPulse.com.au  Fact Sheet for Healthcare Providers: IncredibleEmployment.be  This test is not yet approved or cleared by the [place] FDA and  has been authorized for detection and/or diagnosis of SARS-CoV-2 by FDA under an Emergency Use Authorization (EUA).  This EUA will remain in effect (meaning this test can be used) for the duration of  the COVID-19 declaration under Section 564(b)(1) of the A ct, 21 U.S.C. section 360bbb-3(b)(1), unless the authorization is terminated or revoked sooner.     Influenza A by PCR NEGATIVE NEGATIVE Final   Influenza B by PCR NEGATIVE NEGATIVE Final    Comment: (NOTE) The  Xpert Xpress SARS-CoV-2/FLU/RSV plus assay is intended as an aid in the diagnosis of influenza from Nasopharyngeal swab specimens and should not be used as a sole basis for treatment. Nasal washings and aspirates are unacceptable for Xpert Xpress SARS-CoV-2/FLU/RSV testing.  Fact Sheet for Patients: EntrepreneurPulse.com.au  Fact Sheet for Healthcare Providers: IncredibleEmployment.be  This test is not yet approved or cleared by the [place] FDA and has been authorized for detection and/or diagnosis of SARS-CoV-2 by FDA under an Emergency Use Authorization (EUA). This EUA will remain in effect (meaning this test can be used) for the duration of the COVID-19 declaration under Section 564(b)(1) of the Act, 21 U.S.C. section 360bbb-3(b)(1), unless the authorization is terminated or revoked.  Performed at [facility], [place] [address], [place], [place] [address]   Blood culture (routine x 2)     Status: None   Collection Time: 05/21/22 11:56 PM   Specimen: BLOOD  Result Value Ref Range Status   Specimen Description BLOOD SITE NOT SPECIFIED  Final   Special Requests   Final    BOTTLES DRAWN AEROBIC AND ANAEROBIC Blood Culture results may not be optimal due to an excessive volume of blood received in culture bottles   Culture   Final    NO GROWTH 5 DAYS Performed at [facility], [place] [address] [place], [place] [address]    Report Status 05/27/2022 FINAL  Final  Blood culture (routine x 2)     Status: None   Collection Time: 05/21/22 11:56 PM   Specimen: BLOOD  Result Value Ref Range Status   Specimen Description BLOOD SITE NOT SPECIFIED  Final   Special Requests   Final  BOTTLES DRAWN AEROBIC AND ANAEROBIC Blood Culture results may not be optimal due to an inadequate volume of blood received in culture bottles   Culture   Final    NO GROWTH 5 DAYS Performed at [facility], [place] [address] [place], [place] [address]     Report Status 05/27/2022 FINAL  Final  Urine Culture     Status: None   Collection Time: 05/22/22  4:49 AM   Specimen: Urine, Clean Catch  Result Value Ref Range Status   Specimen Description URINE, CLEAN CATCH  Final   Special Requests NONE  Final   Culture   Final    NO GROWTH Performed at [facility], [place] [address], [place], [place] [address]    Report Status 05/23/2022 FINAL  Final  MRSA Next Gen by PCR, Nasal     Status: None   Collection Time: 05/22/22  7:33 AM   Specimen: Nasal Mucosa; Nasal Swab  Result Value Ref Range Status   MRSA by PCR Next Gen NOT DETECTED NOT DETECTED Final    Comment: (NOTE) The GeneXpert MRSA Assay (FDA approved for NASAL specimens only), is one component of a comprehensive MRSA colonization surveillance program. It is not intended to diagnose MRSA infection nor to guide or monitor treatment for MRSA infections. Test performance is not FDA approved in patients less than 39 years old. Performed at [facility], [place] [address] [place], [place] [address]   Culture, blood (Routine X 2) w Reflex to ID Panel     Status: None (Preliminary result)   Collection Time: 05/27/22  8:08 AM   Specimen: BLOOD  Result Value Ref Range Status   Specimen Description BLOOD LEFT ANTECUBITAL  Final   Special Requests   Final    BOTTLES DRAWN AEROBIC AND ANAEROBIC Blood Culture results may not be optimal due to an inadequate volume of blood received in culture bottles   Culture   Final    NO GROWTH 3 DAYS Performed at [facility], [place] [address], [place], [place] [address]    Report Status PENDING  Incomplete  Culture, blood (Routine X 2) w Reflex to ID Panel     Status: None (Preliminary result)   Collection Time: 05/27/22  8:11 AM   Specimen: BLOOD LEFT WRIST  Result Value Ref Range Status   Specimen Description BLOOD LEFT WRIST  Final   Special Requests   Final    BOTTLES DRAWN AEROBIC AND ANAEROBIC Blood Culture results may not be  optimal due to an inadequate volume of blood received in culture bottles   Culture   Final    NO GROWTH 3 DAYS Performed at [facility], [place] [address] [place],  [address]    Report Status PENDING  Incomplete     Radiology Studies: No results found.  Scheduled Meds:  amLODipine  10 mg Oral Daily   folic acid  1 mg Oral Daily   multivitamin with minerals  1 tablet Oral Daily   pantoprazole  40 mg Oral BID   thiamine  100 mg Oral Daily   thiamine  100 mg Oral Daily   Continuous Infusions:  sodium chloride 100 mL/hr at 05/27/22 2052     LOS: 8 days   Darliss Cheney, MD [facility]  05/30/2022, 10:53 AM   *Please note that this is a verbal dictation therefore any spelling or grammatical errors are due to the "[place] [employer]" system interpretation.  Please  page via Vandiver and do not message via secure chat for urgent patient care matters. Secure chat can be used for non urgent patient care matters.  How to contact the [facility] Attending or Consulting provider [place] or covering provider during after hours [place], for this patient?  Check the care team in [facility] and look for a) attending/consulting [facility] provider listed and b) the [facility] team listed. Page or secure chat [phone]. Log into [email or web address] and use [place]'s universal password to access. If you do not have the password, please contact the hospital operator. Locate the [facility] provider you are looking for under [facility] and page to a number that you can be directly reached. If you still have difficulty reaching the provider, please page the [facility] (Director on Call) for the Hospitalists listed on amion for assistance.

## 2022-05-30 NOTE — Plan of Care (Signed)
  Problem: Education: Goal: Knowledge of General Education information will improve Description: Including pain rating scale, medication(s)/side effects and non-pharmacologic comfort measures Outcome: Progressing   Problem: Nutrition: Goal: Adequate nutrition will be maintained Outcome: Progressing   Problem: Coping: Goal: Level of anxiety will decrease Outcome: Progressing   

## 2022-05-30 NOTE — Progress Notes (Signed)
Mobility Specialist Progress Note:   05/30/22 1040  Mobility  Activity Ambulated with assistance in hallway  Level of Assistance Minimal assist, patient does 75% or more  Assistive Device None  Distance Ambulated (ft) 200 ft  Activity Response Tolerated well  Mobility Referral Yes  $Mobility charge 1 Mobility   Pt eager for mobility session. Required up to minA to ambulate in hallway with an anterior lean throughout session. Pt left sitting EOB, RN present. Bed alarm on.   Nelta Numbers Mobility Specialist Please contact via SecureChat or  Rehab office at [phone]

## 2022-05-30 NOTE — Plan of Care (Signed)

## 2022-05-31 LAB — CBC WITH DIFFERENTIAL/PLATELET
Abs Immature Granulocytes: 0.09 10*3/uL — ABNORMAL HIGH (ref 0.00–0.07)
Basophils Absolute: 0.1 10*3/uL (ref 0.0–0.1)
Basophils Relative: 1 %
Eosinophils Absolute: 0.1 10*3/uL (ref 0.0–0.5)
Eosinophils Relative: 1 %
HCT: 23.7 % — ABNORMAL LOW (ref 39.0–52.0)
Hemoglobin: 8 g/dL — ABNORMAL LOW (ref 13.0–17.0)
Immature Granulocytes: 1 %
Lymphocytes Relative: 12 %
Lymphs Abs: 1.6 10*3/uL (ref 0.7–4.0)
MCH: 28 pg (ref 26.0–34.0)
MCHC: 33.8 g/dL (ref 30.0–36.0)
MCV: 82.9 fL (ref 80.0–100.0)
Monocytes Absolute: 1.3 10*3/uL — ABNORMAL HIGH (ref 0.1–1.0)
Monocytes Relative: 11 %
Neutro Abs: 9.3 10*3/uL — ABNORMAL HIGH (ref 1.7–7.7)
Neutrophils Relative %: 74 %
Platelets: 553 10*3/uL — ABNORMAL HIGH (ref 150–400)
RBC: 2.86 MIL/uL — ABNORMAL LOW (ref 4.22–5.81)
RDW: 17.2 % — ABNORMAL HIGH (ref 11.5–15.5)
WBC: 12.5 10*3/uL — ABNORMAL HIGH (ref 4.0–10.5)
nRBC: 0 % (ref 0.0–0.2)

## 2022-05-31 LAB — BASIC METABOLIC PANEL
Anion gap: 13 (ref 5–15)
BUN: <5 mg/dL — ABNORMAL LOW (ref 6–20)
CO2: 20 mmol/L — ABNORMAL LOW (ref 22–32)
Calcium: 9.1 mg/dL (ref 8.9–10.3)
Chloride: 102 mmol/L (ref 98–111)
Creatinine, Ser: 0.62 mg/dL (ref 0.61–1.24)
GFR, Estimated: >60 mL/min (ref >60)
Glucose, Bld: 102 mg/dL — ABNORMAL HIGH (ref 70–99)
Potassium: 3.7 mmol/L (ref 3.5–5.1)
Sodium: 135 mmol/L (ref 135–145)

## 2022-05-31 LAB — MAGNESIUM: Magnesium: 1.8 mg/dL (ref 1.7–2.4)

## 2022-05-31 MED ORDER — THIAMINE MONONITRATE 100 MG PO TABS
500.0000 mg | ORAL_TABLET | Freq: Every day | ORAL | Status: DC
  Administered 2022-05-31 – 2022-06-01 (×2): 500 mg via ORAL
  Filled 2022-05-31 (×2): qty 5

## 2022-05-31 NOTE — Progress Notes (Signed)
PROGRESS NOTE    Stephen Bowers  DJS:[id number] DOB: Nov 21, 1960 DOA: 05/21/2022 PCP: Ladell Pier, MD   Brief Narrative:  HPI: This is a 61 year old male with past medical history hypertension, CKD, dyslipidemia, alcohol use, peptic ulcer disease, thrombocytopenia and liver cirrhosis.  EMS called because the patient was confused, had nausea, vomiting and hematemesis.     On arrival to the ER he was febrile at 103 and COVID-positive.  Imaging showed pneumonia vs aspiration pneumonia.  Patient had lactic acidosis of 4.8, decreased to 2.6 after sepsis bundle IVF bolus.  Imaging also confirms liver cirrhosis.  Platelets 42 (patient has chronic thrombocytopenia).  In the ER patient received 1000 mg of Tylenol, Tmax increased to 104.2 oral.  Antibiotics given includes Rocephin, Flagyl and azithromycin.  Blood cultures x2 and urine culture collected.  80 mg IV Protonix also given.   Patient does not recall the events that brought him here.  Patient states he was simply dizzy.  He endorses drinking 3-4 shots of brandy and 3-4 bottles every weekend..  He states he does not get withdrawal symptoms.    Assessment & Plan:   Principal Problem:   Alcoholic gastritis with bleeding Active Problems:   HTN (hypertension)   Hyperlipidemia   Alcohol use disorder, mild, abuse   Seizure-like activity (HCC)   Thrombocytopenia (HCC)   Alcohol abuse   Anemia of chronic disease   Duodenal ulcer   Pneumonia due to COVID-19 virus   Aspiration pneumonia (HCC)   Lactic acid acidosis   Chronic liver failure (HCC)   Gout   Hematemesis with nausea   Alcoholic cirrhosis of liver without ascites (HCC)  Severe sepsis COVID-19 pneumonia acute hypoxic respiratory failure secondary to possible aspiration pneumonia:  - Meets criteria for severe sepsis based on fever, tachycardia, tachypnea and lactic acid of> 2.  -Significant for bilateral patchy lung opacities in right lower lobe, consistent with  multifocal/aspiration pneumonia  -Procalcitonin is elevated as well, so treated with IV Zosyn x 7 days . -He was encouraged use incentive spirometry and flutter valve today . -Physiology of sepsis has resolved .  History of alcoholic gastritis Hematemesis/anemia of chronic disease:  Patient is a poor historian.  Unsure whether he had hematemesis or hemoptysis.  He cannot recall anything at all.  He says that he came in because of the dizziness.  Hemoglobin fairly stable.  GI has seen him, no plans for EGD.  Continue PPI.   Alcoholic liver cirrhosis/coagulopathy:  INR slightly elevated at 1.1.  Patient noncompliant, continues to drink, last drink almost 2 days prior to admission.  Will avoid Tylenol.  Slightly elevated LFTs than his baseline, AST much more elevated, ALT is normal.  Indicative of alcoholic liver disease.  Monitor closely.  Alcohol withdrawal: Starting afternoon of 05/24/2022, patient has started having alcohol withdrawal, he was too agitated to be calm down with couple of doses of Haldol as well as Ativan and subsequently he needed to be placed in soft restraints as he was unsteady and was trying to leave.  Improving, CIWA is improving, and it looks like he has not required any Ativan in last 24 hours.  He is fully alert and oriented as well today.  Looks like he is over the withdrawal now.   Hypokalemia: Low again, will replace.  Hypomagnesemia: Low again, will replace.  Acute metabolic encephalopathy: Fully alert and oriented.  Chronic thrombocytopenia: Secondary to liver cirrhosis and now with sepsis and COVID.  Now within normal range.  Essential hypertension: Controlled.  Continue Norvasc.  Acute on chronic normocytic anemia: -  Baseline hemoglobin appears to be around 8-9, trending down closely, it is 7.7 today.    DVT prophylaxis: SCDs Start: 05/22/22 0435   Code Status: Full Code  Family Communication:  D/W wife by phone  Status is: Inpatient Remains inpatient  appropriate because: Patient is near discharge now.  May be discharged in 1 to 2 days but currently has anemia.  PT OT recommends SNF.  TOC consulted.   Estimated body mass index is 27.62 kg/m as calculated from the following:   Height as of this encounter: '5\' 11"'$  (1.803 m).   Weight as of this encounter: 89.8 kg.    Nutritional Assessment: Body mass index is 27.62 kg/m.[name] Seen by dietician.  I agree with the assessment and plan as outlined below: Nutrition Status:        . Skin Assessment: I have examined the patient's skin and I agree with the wound assessment as performed by the wound care RN as outlined below:    Consultants:  GI  Procedures:  None  Antimicrobials:  Anti-infectives (From admission, onward)    Start     Dose/Rate Route Frequency Ordered Stop   05/23/22 1000  remdesivir 100 mg in sodium chloride 0.9 % 100 mL IVPB       See Hyperspace for full Linked Orders Report.   100 mg 200 mL/hr over 30 Minutes Intravenous Daily 05/22/22 0448 05/24/22 1535   05/22/22 2200  vancomycin (VANCOREADY) IVPB 1250 mg/250 mL  Status:  Discontinued        1,250 mg 166.7 mL/hr over 90 Minutes Intravenous Every 12 hours 05/22/22 0550 05/22/22 1124   05/22/22 1400  ceFEPIme (MAXIPIME) 2 g in sodium chloride 0.9 % 100 mL IVPB  Status:  Discontinued        2 g 200 mL/hr over 30 Minutes Intravenous Every 8 hours 05/22/22 0550 05/22/22 1125   05/22/22 1400  piperacillin-tazobactam (ZOSYN) IVPB 3.375 g  Status:  Discontinued        3.375 g 12.5 mL/hr over 240 Minutes Intravenous Every 8 hours 05/22/22 1137 05/30/22 1052   05/22/22 0630  remdesivir 200 mg in sodium chloride 0.9% 250 mL IVPB       See Hyperspace for full Linked Orders Report.   200 mg 580 mL/hr over 30 Minutes Intravenous Once 05/22/22 0448 05/22/22 0651   05/22/22 0630  vancomycin (VANCOREADY) IVPB 1500 mg/300 mL        1,500 mg 150 mL/hr over 120 Minutes Intravenous  Once 05/22/22 0550 05/22/22 0935   05/22/22  0200  azithromycin (ZITHROMAX) 500 mg in sodium chloride 0.9 % 250 mL IVPB  Status:  Discontinued        500 mg 250 mL/hr over 60 Minutes Intravenous Every 24 hours 05/22/22 0159 05/22/22 0541   05/22/22 0015  cefTRIAXone (ROCEPHIN) 2 g in sodium chloride 0.9 % 100 mL IVPB        2 g 200 mL/hr over 30 Minutes Intravenous  Once 05/22/22 0000 05/22/22 0105   05/22/22 0015  metroNIDAZOLE (FLAGYL) IVPB 500 mg  Status:  Discontinued        500 mg 100 mL/hr over 60 Minutes Intravenous Every 12 hours 05/22/22 0000 05/22/22 0542         Subjective:  No significant events overnight as discussed with staff, he denies any complaints today.     Objective: Vitals:   05/31/22 0300 05/31/22 0728 05/31/22 0830  05/31/22 1200  BP: (!) 145/82 122/86 122/86 120/82  Pulse: 90 87 87 82  Resp: (!) '21 20  17  '$ Temp: 99.1 F (37.3 C) 98.6 F (37 C)  98.3 F (36.8 C)  TempSrc: Oral Oral  Oral  SpO2: 95% 98%  97%  Weight:      Height:        Intake/Output Summary (Last 24 hours) at 05/31/2022 1405 Last data filed at 05/31/2022 0800 Gross per 24 hour  Intake --  Output 1 ml  Net -1 ml   Filed Weights   05/21/22 2346  Weight: 89.8 kg    Examination:  Awake Alert, No new F.N deficits, Normal affect Symmetrical Chest wall movement, Good air movement bilaterally, CTAB RRR,No Gallops,Rubs or new Murmurs, No Parasternal Heave +ve B.Sounds, Abd Soft, No tenderness, No rebound - guarding or rigidity. No Cyanosis, Clubbing or edema, No new Rash or bruise     Data Reviewed: I have personally reviewed following labs and imaging studies  CBC: Recent Labs  Lab 05/26/22 0933 05/28/22 0246 05/29/22 0906 05/30/22 0613 05/30/22 1203 05/31/22 0838  WBC 18.5* 16.4* 12.6* 11.2*  --  12.5*  NEUTROABS 14.8* 13.0* 9.6* 8.4*  --  9.3*  HGB 8.7* 7.9* 7.6* 7.0* 7.5* 8.0*  HCT 27.0* 24.7* 22.2* 20.8* 22.4* 23.7*  MCV 84.9 86.7 81.9 83.5  --  82.9  PLT 127* 246 324 382  --  782*   Basic Metabolic  Panel: Recent Labs  Lab 05/26/22 0933 05/28/22 0246 05/29/22 0906 05/30/22 0613 05/31/22 0838  NA 135 137 134* 132* 135  K 3.5 3.2* 3.3* 2.8* 3.7  CL 98 103 105 100 102  CO2 19* 22 20* 23 20*  GLUCOSE 124* 110* 138* 109* 102*  BUN '11 8 6 '$ <5* <5*  CREATININE 0.87 0.78 0.74 0.60* 0.62  CALCIUM 9.0 8.9 8.5* 8.4* 9.1  MG  --   --   --  1.4* 1.8   GFR: Estimated Creatinine Clearance: 105.9 mL/min (by C-G formula based on SCr of 0.62 mg/dL). Liver Function Tests: Recent Labs  Lab 05/26/22 0933 05/29/22 0906  AST 83* 70*  ALT 35 37  ALKPHOS 38 37*  BILITOT 1.3* 0.8  PROT 7.8 7.2  ALBUMIN 3.1* 2.4*   No results for input(s): "LIPASE", "AMYLASE" in the last 168 hours.  No results for input(s): "AMMONIA" in the last 168 hours.  Coagulation Profile: No results for input(s): "INR", "PROTIME" in the last 168 hours.  Cardiac Enzymes: No results for input(s): "CKTOTAL", "CKMB", "CKMBINDEX", "TROPONINI" in the last 168 hours. BNP (last 3 results) No results for input(s): "PROBNP" in the last 8760 hours. HbA1C: No results for input(s): "HGBA1C" in the last 72 hours. CBG: Recent Labs  Lab 05/30/22 1302  GLUCAP 111*   Lipid Profile: No results for input(s): "CHOL", "HDL", "LDLCALC", "TRIG", "CHOLHDL", "LDLDIRECT" in the last 72 hours.  Thyroid Function Tests: No results for input(s): "TSH", "T4TOTAL", "FREET4", "T3FREE", "THYROIDAB" in the last 72 hours. Anemia Panel: No results for input(s): "VITAMINB12", "FOLATE", "FERRITIN", "TIBC", "IRON", "RETICCTPCT" in the last 72 hours.  Sepsis Labs: Recent Labs  Lab 05/26/22 0933 05/28/22 0246  PROCALCITON 2.08 1.15    Recent Results (from the past 240 hour(s))  Blood culture (routine x 2)     Status: None   Collection Time: 05/21/22 11:56 PM   Specimen: BLOOD  Result Value Ref Range Status   Specimen Description BLOOD SITE NOT SPECIFIED  Final   Special Requests  Final    BOTTLES DRAWN AEROBIC AND ANAEROBIC Blood  Culture results may not be optimal due to an excessive volume of blood received in culture bottles   Culture   Final    NO GROWTH 5 DAYS Performed at [facility] [address] [place], [place] [address]    Report Status 05/27/2022 FINAL  Final  Blood culture (routine x 2)     Status: None   Collection Time: 05/21/22 11:56 PM   Specimen: BLOOD  Result Value Ref Range Status   Specimen Description BLOOD SITE NOT SPECIFIED  Final   Special Requests   Final    BOTTLES DRAWN AEROBIC AND ANAEROBIC Blood Culture results may not be optimal due to an inadequate volume of blood received in culture bottles   Culture   Final    NO GROWTH 5 DAYS Performed at [facility], [place] [address], [place], [place] [address]    Report Status 05/27/2022 FINAL  Final  Urine Culture     Status: None   Collection Time: 05/22/22  4:49 AM   Specimen: Urine, Clean Catch  Result Value Ref Range Status   Specimen Description URINE, CLEAN CATCH  Final   Special Requests NONE  Final   Culture   Final    NO GROWTH Performed at [facility], [place] [address] [place], [place] [address]    Report Status 05/23/2022 FINAL  Final  MRSA Next Gen by PCR, Nasal     Status: None   Collection Time: 05/22/22  7:33 AM   Specimen: Nasal Mucosa; Nasal Swab  Result Value Ref Range Status   MRSA by PCR Next Gen NOT DETECTED NOT DETECTED Final    Comment: (NOTE) The GeneXpert MRSA Assay (FDA approved for NASAL specimens only), is one component of a comprehensive MRSA colonization surveillance program. It is not intended to diagnose MRSA infection nor to guide or monitor treatment for MRSA infections. Test performance is not FDA approved in patients less than 50 years old. Performed at [facility], [place] [address], [place], [place] [address]   Culture, blood (Routine X 2) w Reflex to ID Panel     Status: None (Preliminary result)   Collection Time: 05/27/22  8:08 AM   Specimen: BLOOD  Result Value  Ref Range Status   Specimen Description BLOOD LEFT ANTECUBITAL  Final   Special Requests   Final    BOTTLES DRAWN AEROBIC AND ANAEROBIC Blood Culture results may not be optimal due to an inadequate volume of blood received in culture bottles   Culture   Final    NO GROWTH 4 DAYS Performed at [facility], [place] [address], [place], [place] [address]    Report Status PENDING  Incomplete  Culture, blood (Routine X 2) w Reflex to ID Panel     Status: None (Preliminary result)   Collection Time: 05/27/22  8:11 AM   Specimen: BLOOD LEFT WRIST  Result Value Ref Range Status   Specimen Description BLOOD LEFT WRIST  Final   Special Requests   Final    BOTTLES DRAWN AEROBIC AND ANAEROBIC Blood Culture results may not be optimal due to an inadequate volume of blood received in culture bottles   Culture   Final    NO GROWTH 4 DAYS Performed at [facility], [place] [address] [place], [place] [address]    Report Status PENDING  Incomplete     Radiology Studies: No results  found.  Scheduled Meds:  amLODipine  10 mg Oral Daily   folic acid  1 mg Oral Daily   multivitamin with minerals  1 tablet Oral Daily   pantoprazole  40 mg Oral BID   thiamine  100 mg Oral Daily   Continuous Infusions:  sodium chloride 100 mL/hr at 05/27/22 2052     LOS: 9 days   Phillips Climes, MD [facility]  05/31/2022, 2:05 PM   *Please note that this is a verbal dictation therefore any spelling or grammatical errors are due to the "[employer]" system interpretation.  Please page via [place] and do not message via secure chat for urgent patient care matters. Secure chat can be used for non urgent patient care matters.  How to contact the [facility] Attending or Consulting provider [place] or covering provider during after hours [place], for this patient?  Check the care team in [facility] and look for a) attending/consulting [facility] provider listed and b) the [facility] team listed. Page or secure chat  7A-7P. Log into [email or web address] and use [facility] [place]'s universal password to access. If you do not have the password, please contact the hospital operator. Locate the [facility] provider you are looking for under [facility] and page to a number that you can be directly reached. If you still have difficulty reaching the provider, please page the [facility] (Director on Call) for the Hospitalists listed on amion for assistance.

## 2022-05-31 NOTE — Plan of Care (Signed)
  Problem: Education: Goal: Knowledge of General Education information will improve Description: Including pain rating scale, medication(s)/side effects and non-pharmacologic comfort measures Outcome: Progressing   Problem: Health Behavior/Discharge Planning: Goal: Ability to manage health-related needs will improve Outcome: Progressing   Problem: Nutrition: Goal: Adequate nutrition will be maintained Outcome: Progressing   

## 2022-05-31 NOTE — Plan of Care (Signed)

## 2022-05-31 NOTE — TOC Progression Note (Signed)
Transition of Care [facility]) - Progression Note    Patient Details  Name: GRAYER SPROLES MRN: [id number] Date of Birth: [birth year]  [facility] CM/SW Contact  Cyndi Bender, RN Phone Number: 05/31/2022, 12:40 PM  Clinical Narrative:     Spoke to patient regarding transition needs.  Patient is agreeable to discharge home with OP rehab. Patient chose [facility] health rehab on [address].  Referral sent.  Patient states his wife can transport him to apts.  TOC will continue to follow for needs.   Expected Discharge Plan: OP Rehab Barriers to Discharge: Continued Medical Work up  Expected Discharge Plan and Services Expected Discharge Plan: OP Rehab       Living arrangements for the past 2 months: Apartment                                       Social Determinants of Health (SDOH) Interventions    Readmission Risk Interventions     No data to display

## 2022-05-31 NOTE — Progress Notes (Signed)
Mobility Specialist Progress Note:   05/31/22 0955  Mobility  Activity Ambulated with assistance in hallway  Level of Assistance Minimal assist, patient does 75% or more  Assistive Device None  Distance Ambulated (ft) 175 ft  Activity Response Tolerated well  Mobility Referral Yes  $Mobility charge 1 Mobility   Pt eager for mobility session this am. Required up to minA with ambulation for steadying, otherwise min guard assist. Pt asx throughout, HR 120s with ambulation. Back in bed with all needs met, bed alarm on.   Nelta Numbers Mobility Specialist Please contact via SecureChat or  Rehab office at [phone]

## 2022-06-01 ENCOUNTER — Other Ambulatory Visit (HOSPITAL_COMMUNITY): Payer: Self-pay

## 2022-06-01 LAB — CULTURE, BLOOD (ROUTINE X 2)
Culture: NO GROWTH
Culture: NO GROWTH

## 2022-06-01 LAB — BASIC METABOLIC PANEL
Anion gap: 13 (ref 5–15)
BUN: 7 mg/dL (ref 6–20)
CO2: 20 mmol/L — ABNORMAL LOW (ref 22–32)
Calcium: 9 mg/dL (ref 8.9–10.3)
Chloride: 100 mmol/L (ref 98–111)
Creatinine, Ser: 0.71 mg/dL (ref 0.61–1.24)
GFR, Estimated: >60 mL/min (ref >60)
Glucose, Bld: 111 mg/dL — ABNORMAL HIGH (ref 70–99)
Potassium: 4.5 mmol/L (ref 3.5–5.1)
Sodium: 133 mmol/L — ABNORMAL LOW (ref 135–145)

## 2022-06-01 LAB — CBC
HCT: 23.4 % — ABNORMAL LOW (ref 39.0–52.0)
Hemoglobin: 7.7 g/dL — ABNORMAL LOW (ref 13.0–17.0)
MCH: 27.7 pg (ref 26.0–34.0)
MCHC: 32.9 g/dL (ref 30.0–36.0)
MCV: 84.2 fL (ref 80.0–100.0)
Platelets: 583 10*3/uL — ABNORMAL HIGH (ref 150–400)
RBC: 2.78 MIL/uL — ABNORMAL LOW (ref 4.22–5.81)
RDW: 17.3 % — ABNORMAL HIGH (ref 11.5–15.5)
WBC: 12.5 10*3/uL — ABNORMAL HIGH (ref 4.0–10.5)
nRBC: 0 % (ref 0.0–0.2)

## 2022-06-01 LAB — PHOSPHORUS: Phosphorus: 4.9 mg/dL — ABNORMAL HIGH (ref 2.5–4.6)

## 2022-06-01 LAB — MAGNESIUM: Magnesium: 1.6 mg/dL — ABNORMAL LOW (ref 1.7–2.4)

## 2022-06-01 MED ORDER — PANTOPRAZOLE SODIUM 40 MG PO TBEC
40.0000 mg | DELAYED_RELEASE_TABLET | Freq: Two times a day (BID) | ORAL | 1 refills | Status: DC
  Filled 2022-06-01: qty 60, 30d supply, fill #0

## 2022-06-01 MED ORDER — THIAMINE HCL 100 MG PO TABS
100.0000 mg | ORAL_TABLET | Freq: Every day | ORAL | 1 refills | Status: DC
  Filled 2022-06-01: qty 30, 30d supply, fill #0

## 2022-06-01 NOTE — Discharge Summary (Signed)
Physician Discharge Summary  MARKEIS ALLMAN HTD:[id number] DOB: 1960/07/04 DOA: 05/21/2022  PCP: Ladell Pier, MD  Admit date: 05/21/2022 Discharge date: 06/01/2022  Admitted From: (Home) Disposition:  (Home)  Recommendations for Outpatient Follow-up:  Follow up with PCP in 1-2 weeks Please obtain BMP/CBC in one week Please continue counseling about alcohol abuse    Discharge Condition: (Stable) CODE STATUS: (FULL) Diet recommendation: Heart Healthy  Brief/Interim Summary:  This is a 61 year old male with past medical history hypertension, CKD, dyslipidemia, alcohol use, peptic ulcer disease, thrombocytopenia and liver cirrhosis.  EMS called because the patient was confused, had nausea, vomiting and hematemesis.     On arrival to the ER he was febrile at 103 and COVID-positive.  Imaging showed pneumonia vs aspiration pneumonia.  Patient had lactic acidosis of 4.8, decreased to 2.6 after sepsis bundle IVF bolus.  Imaging also confirms liver cirrhosis.  Platelets 42 (patient has chronic thrombocytopenia).  In the ER patient received 1000 mg of Tylenol, Tmax increased to 104.2 oral.  Antibiotics given includes Rocephin, Flagyl and azithromycin.  Blood cultures x2 and urine culture collected.  80 mg IV Protonix also given.   Patient does not recall the events that brought him here.  Patient states he was simply dizzy.  He endorses drinking 3-4 shots of brandy and 3-4 bottles every weekend..  He states he does not get withdrawal symptoms.    Severe sepsis, POA due  COVID-19 pneumonia acute hypoxic respiratory failure secondary to possible aspiration pneumonia:  - Meets criteria for severe sepsis based on fever, tachycardia, tachypnea and lactic acid of> 2.  -Significant for bilateral patchy lung opacities in right lower lobe, consistent with multifocal/aspiration pneumonia  -Procalcitonin is elevated as well, so treated with IV Zosyn x 7 days .  No further antibiotics on  discharge -He was encouraged use incentive spirometry and flutter valve. -Physiology of sepsis has resolved .   History of alcoholic gastritis Hematemesis/anemia of chronic disease:  Patient is a poor historian.  Unsure whether he had hematemesis or hemoptysis.  He cannot recall anything at all.  He says that he came in because of the dizziness.  Hemoglobin fairly stable.  GI has seen him, no plans for EGD.  Continue PPI.  Monitor CBC closely.   Alcoholic liver cirrhosis/coagulopathy:  INR slightly elevated at 1.1.  Patient noncompliant, continues to drink, last drink almost 2 days prior to admission.  Will avoid Tylenol.  Slightly elevated LFTs than his baseline, AST much more elevated, ALT is normal.  Indicative of alcoholic liver disease.    Alcohol withdrawal:  - Starting afternoon of 05/24/2022, patient has started having alcohol withdrawal, he was too agitated to be calm down with couple of doses of Haldol as well as Ativan and subsequently he needed to be placed in soft restraints as he was unsteady and was trying to leave.  He was started on CIWA protocol, significantly improved currently, no withdrawals over last 48 hours, he is over his withdrawals now, instructed to continue with thiamine and folic acid.     Hypokalemia:  replaced   Hypomagnesemia: replaced   Acute metabolic encephalopathy: resolved   Chronic thrombocytopenia: Secondary to liver cirrhosis and now with sepsis and COVID.  Now within normal range.   Essential hypertension: Controlled.  Continue Norvasc.   Acute on chronic normocytic anemia: -  Baseline hemoglobin appears to be around 8-9, trending down closely, it is 7.7 on discharge, continue with iron  supplements.   Discharge Diagnoses:  Principal  Problem:   Alcoholic gastritis with bleeding Active Problems:   HTN (hypertension)   Hyperlipidemia   Alcohol use disorder, mild, abuse   Seizure-like activity (HCC)   Thrombocytopenia (HCC)   Alcohol abuse    Anemia of chronic disease   Duodenal ulcer   Pneumonia due to COVID-19 virus   Aspiration pneumonia (HCC)   Lactic acid acidosis   Chronic liver failure (HCC)   Gout   Hematemesis with nausea   Alcoholic cirrhosis of liver without ascites [facility])    Discharge Instructions  Discharge Instructions     Ambulatory referral to Physical Therapy   Complete by: As directed    Diet - low sodium heart healthy   Complete by: As directed    Discharge instructions   Complete by: As directed    Follow with Primary MD Ladell Pier, MD in 7 days   Get CBC, CMP,  checked  by Primary MD next visit.    Activity: As tolerated with Full fall precautions use walker/cane & assistance as needed   Disposition Home    Diet: Heart Healthy   On your next visit with your primary care physician please Get Medicines reviewed and adjusted.   Please request your Prim.MD to go over all Hospital Tests and Procedure/Radiological results at the follow up, please get all Hospital records sent to your Prim MD by signing hospital release before you go home.   If you experience worsening of your admission symptoms, develop shortness of breath, life threatening emergency, suicidal or homicidal thoughts you must seek medical attention immediately by calling 911 or calling your MD immediately  if symptoms less severe.  You Must read complete instructions/literature along with all the possible adverse reactions/side effects for all the Medicines you take and that have been prescribed to you. Take any new Medicines after you have completely understood and accpet all the possible adverse reactions/side effects.   Do not drive, operating heavy machinery, perform activities at heights, swimming or participation in water activities or provide baby sitting services if your were admitted for syncope or siezures until you have seen by Primary MD or a Neurologist and advised to do so again.  Do not drive when taking  Pain medications.    Do not take more than prescribed Pain, Sleep and Anxiety Medications  Special Instructions: If you have smoked or chewed Tobacco  in the last 2 yrs please stop smoking, stop any regular Alcohol  and or any Recreational drug use.  Wear Seat belts while driving.   Please note  You were cared for by a hospitalist during your hospital stay. If you have any questions about your discharge medications or the care you received while you were in the hospital after you are discharged, you can call the unit and asked to speak with the hospitalist on call if the hospitalist that took care of you is not available. Once you are discharged, your primary care physician will handle any further medical issues. Please note that NO REFILLS for any discharge medications will be authorized once you are discharged, as it is imperative that you return to your primary care physician (or establish a relationship with a primary care physician if you do not have one) for your aftercare needs so that they can reassess your need for medications and monitor your lab values.   Increase activity slowly   Complete by: As directed       Allergies as of 06/01/2022   No Known Allergies  Medication List     STOP taking these medications    acetaminophen 650 MG CR tablet Commonly known as: TYLENOL       TAKE these medications    amLODipine 10 MG tablet Commonly known as: NORVASC TAKE 1 TABLET (10 MG TOTAL) BY MOUTH DAILY. What changed: how much to take   cholecalciferol 25 MCG (1000 UNIT) tablet Commonly known as: VITAMIN D3 Take 1,000 Units by mouth daily.   colchicine 0.6 MG tablet TAKE 1 TABLET BY MOUTH EVERY OTHER DAY ON MONDAY, WEDNESDAY, AND FRIDAY. What changed:  how much to take how to take this when to take this   ELDERBERRY PO Take 1 tablet by mouth daily.   ferrous sulfate 325 (65 FE) MG tablet Take 1 tablet (325 mg total) by mouth 2 (two) times daily with a  meal.   folic acid 1 MG tablet Commonly known as: FOLVITE Take 1 tablet (1 mg total) by mouth daily.   multivitamin with minerals Tabs tablet Take 1 tablet by mouth daily.   pantoprazole 40 MG tablet Commonly known as: PROTONIX Take 1 tablet (40 mg total) by mouth 2 (two) times daily before a meal. What changed: when to take this   thiamine 100 MG tablet Commonly known as: VITAMIN B1 Take 1 tablet (100 mg total) by mouth daily.   VISINE OP Place 1 drop into both eyes daily as needed (dry eyes).   VITAMIN C PO Take 1 tablet by mouth daily.   vitamin E 1000 UNIT capsule Take 1,000 Units by mouth daily.   XYZAL PO Take 1 tablet by mouth daily as needed (allergies).   ZINC PO Take 1 tablet by mouth daily.        Follow-up Information     [facility] Follow up.   Specialty: Rehabilitation Why: Call to schedule apt for physical therapy Contact information: [address] [phone] [place]        Ladell Pier, MD Follow up.   Specialty: Internal Medicine Contact information: [address] [place] [address] [phone]                No Known Allergies  Consultations: GI   Procedures/Studies: DG CHEST PORT 1 VIEW  Result Date: 05/27/2022 CLINICAL DATA:  Pneumonia. EXAM: PORTABLE CHEST 1 VIEW COMPARISON:  May 22, 2022. FINDINGS: The heart size and mediastinal contours are within normal limits. Both lungs are clear. The visualized skeletal structures are unremarkable. IMPRESSION: No active disease. Electronically Signed   By: Marijo Conception M.D.   On: 05/27/2022 09:17   CT Angio Chest Pulmonary Embolism (PE) W or WO Contrast  Result Date: 05/22/2022 CLINICAL DATA:  Altered mental status and vomiting blood. Patient has fever. EXAM: CT ANGIOGRAPHY CHEST WITH CONTRAST TECHNIQUE: Multidetector CT imaging of the chest was performed using the  standard protocol during bolus administration of intravenous contrast. Multiplanar CT image reconstructions and MIPs were obtained to evaluate the vascular anatomy. RADIATION DOSE REDUCTION: This exam was performed according to the departmental dose-optimization program which includes automated exposure control, adjustment of the mA and/or kV according to patient size and/or use of iterative reconstruction technique. CONTRAST:  31m OMNIPAQUE IOHEXOL 350 MG/ML SOLN COMPARISON:  None Available. FINDINGS: Cardiovascular: Satisfactory opacification of the pulmonary arteries to the segmental level. No evidence of pulmonary embolism. Normal heart size. No pericardial effusion. Mild coronary artery and atherosclerotic calcifications. Mediastinum/Nodes: No enlarged mediastinal, hilar, or axillary  lymph nodes. Thyroid gland, trachea, and esophagus demonstrate no significant findings. Lungs/Pleura: There are bilateral patchy lung opacities with a confluent opacity in the right lower lobe with air bronchograms consistent with multifocal bilateral pneumonia. No pleural effusion or pneumothorax. Upper Abdomen: Mild thickening of the distal esophageal wall with small hiatal hernia. Musculoskeletal: No chest wall abnormality. No acute or significant osseous findings. Chronic healed fractures of the right 9/10 ribs. Review of the MIP images confirms the above findings. IMPRESSION: 1. No evidence of pulmonary embolism. 2. Bilateral patchy lung opacities with a confluent opacity in the right lower lobe with air bronchograms consistent with multifocal bilateral pneumonia. 3. Mild coronary artery and atherosclerotic calcifications. 4. Mild thickening of the distal esophageal wall with small hiatal hernia. Gastroenterology consultation and endoscopy for further evaluation is recommended if not previously performed. Aortic Atherosclerosis (ICD10-I70.0). Electronically Signed   By: Keane Police D.O.   On: 05/22/2022 10:23   CT HEAD WO  CONTRAST (5MM)  Result Date: 05/22/2022 CLINICAL DATA:  Altered mental status, hemoptysis, ETOH EXAM: CT HEAD WITHOUT CONTRAST TECHNIQUE: Contiguous axial images were obtained from the base of the skull through the vertex without intravenous contrast. RADIATION DOSE REDUCTION: This exam was performed according to the departmental dose-optimization program which includes automated exposure control, adjustment of the mA and/or kV according to patient size and/or use of iterative reconstruction technique. COMPARISON:  04/12/2020 FINDINGS: Brain: No evidence of acute infarction, hemorrhage, hydrocephalus, extra-axial collection or mass lesion/mass effect. Mild cortical atrophy. Mild subcortical white matter and periventricular small vessel ischemic changes. Vascular: No hyperdense vessel or unexpected calcification. Skull: Normal. Negative for fracture or focal lesion. Sinuses/Orbits: The visualized paranasal sinuses are essentially clear. The mastoid air cells are unopacified. Other: None. IMPRESSION: No evidence of acute intracranial abnormality. Mild atrophy with mild small vessel ischemic changes. Electronically Signed   By: Julian Hy M.D.   On: 05/22/2022 01:54   CT ABDOMEN PELVIS W CONTRAST  Result Date: 05/22/2022 CLINICAL DATA:  Epigastric abdominal pain, vomiting, fever. Hemoptysis. History of ETOH. EXAM: CT ABDOMEN AND PELVIS WITH CONTRAST TECHNIQUE: Multidetector CT imaging of the abdomen and pelvis was performed using the standard protocol following bolus administration of intravenous contrast. RADIATION DOSE REDUCTION: This exam was performed according to the departmental dose-optimization program which includes automated exposure control, adjustment of the mA and/or kV according to patient size and/or use of iterative reconstruction technique. CONTRAST:  69m OMNIPAQUE IOHEXOL 350 MG/ML SOLN COMPARISON:  11/23/2018 FINDINGS: Lower chest: Subpleural patchy opacity in the medial right lower  lobe (series 5/image 1), incompletely visualized. Differential considerations include aspiration versus pneumonia. Hepatobiliary: Mildly nodular hepatic contour, suggesting early cirrhosis. Gallbladder is unremarkable. No intrahepatic or extrahepatic duct dilatation. Pancreas: Within normal limits. Spleen: Within normal limits Adrenals/Urinary Tract: Adrenal glands are within normal limits. Bilateral renal cysts, measuring up to 14 mm in the medial right lower kidney (series 3/image 36), benign (Bosniak I). Additional 10 mm cyst in the lateral interpolar left kidney (series 3/image 31) with an associated calcification, unchanged from 2020, benign. No follow-up is recommended. No hydronephrosis. Bladder is within normal limits. Stomach/Bowel: Mild wall thickening involving the distal esophagus (series 3/image 6) with a suspected tiny hiatal hernia. This appearance favors reflux esophagitis. No evidence of bowel obstruction. Normal appendix (coronal image 49). No colonic wall thickening or inflammatory changes. Vascular/Lymphatic: No evidence of abdominal aortic aneurysm. Atherosclerotic calcifications of the abdominal aorta and branch vessels. Portal vein is patent. No suspicious abdominopelvic lymphadenopathy. Reproductive: Prostate is unremarkable. Other:  No abdominopelvic ascites. Tiny fat containing bilateral inguinal hernias (series 3/image 82). Musculoskeletal: Visualized osseous structures are within normal limits. IMPRESSION: Subpleural patchy opacity in the medial right lower lobe, incompletely visualized, suggesting aspiration versus pneumonia. Mild wall thickening involving the distal esophagus, correlate for esophagitis. Associated tiny hiatal hernia. Early cirrhosis.  Portal vein is patent. Additional ancillary findings as above. Electronically Signed   By: Julian Hy M.D.   On: 05/22/2022 01:53   DG Chest Port 1 View  Result Date: 05/22/2022 CLINICAL DATA:  Sepsis, vomiting EXAM: PORTABLE  CHEST 1 VIEW COMPARISON:  07/05/2021 FINDINGS: Heart and mediastinal contours are within normal limits. No focal opacities or effusions. No acute bony abnormality. IMPRESSION: No active disease. Electronically Signed   By: Rolm Baptise M.D.   On: 05/22/2022 00:22     Subjective: No significant events overnight, he denies any complaints  Discharge Exam: Vitals:   06/01/22 0400 06/01/22 0758  BP: (!) 120/92 107/88  Pulse: 91 91  Resp: 17 16  Temp: 98.5 F (36.9 C) 99.3 F (37.4 C)  SpO2: 96% 97%   Vitals:   05/31/22 1930 05/31/22 2326 06/01/22 0400 06/01/22 0758  BP: 125/87 123/82 (!) 120/92 107/88  Pulse: 89 89 91 91  Resp: '17 19 17 16  '$ Temp: 99.8 F (37.7 C) 99.4 F (37.4 C) 98.5 F (36.9 C) 99.3 F (37.4 C)  TempSrc: Oral Oral Oral Oral  SpO2: 96% 96% 96% 97%  Weight:      Height:        General: Pt is alert, awake, not in acute distress Cardiovascular: RRR, S1/S2 +, no rubs, no gallops Respiratory: CTA bilaterally, no wheezing, no rhonchi Abdominal: Soft, NT, ND, bowel sounds + Extremities: no edema, no cyanosis    The results of significant diagnostics from this hospitalization (including imaging, microbiology, ancillary and laboratory) are listed below for reference.     Microbiology: Recent Results (from the past 240 hour(s))  Culture, blood (Routine X 2) w Reflex to ID Panel     Status: None   Collection Time: 05/27/22  8:08 AM   Specimen: BLOOD  Result Value Ref Range Status   Specimen Description BLOOD LEFT ANTECUBITAL  Final   Special Requests   Final    BOTTLES DRAWN AEROBIC AND ANAEROBIC Blood Culture results may not be optimal due to an inadequate volume of blood received in culture bottles   Culture   Final    NO GROWTH 5 DAYS Performed at [facility], [place] [address], [place], [place] [address]    Report Status 06/01/2022 FINAL  Final  Culture, blood (Routine X 2) w Reflex to ID Panel     Status: None   Collection Time: 05/27/22  8:11 AM    Specimen: BLOOD LEFT WRIST  Result Value Ref Range Status   Specimen Description BLOOD LEFT WRIST  Final   Special Requests   Final    BOTTLES DRAWN AEROBIC AND ANAEROBIC Blood Culture results may not be optimal due to an inadequate volume of blood received in culture bottles   Culture   Final    NO GROWTH 5 DAYS Performed at [facility], [place] [address], [place], [place] [address]    Report Status 06/01/2022 FINAL  Final     Labs: BNP (last 3 results) Recent Labs    07/05/21 1427  BNP 70.6   Basic Metabolic Panel: Recent Labs  Lab 05/28/22 0246 05/29/22 2376 05/30/22 2831 05/31/22 5176 06/01/22 0453  NA 137 134* 132* 135 133*  K 3.2* 3.3* 2.8* 3.7 4.5  CL 103 105 100 102 100  CO2 22 20* 23 20* 20*  GLUCOSE 110* 138* 109* 102* 111*  BUN 8 6 <5* <5* 7  CREATININE 0.78 0.74 0.60* 0.62 0.71  CALCIUM 8.9 8.5* 8.4* 9.1 9.0  MG  --   --  1.4* 1.8 1.6*  PHOS  --   --   --   --  4.9*   Liver Function Tests: Recent Labs  Lab 05/26/22 0933 05/29/22 0906  AST 83* 70*  ALT 35 37  ALKPHOS 38 37*  BILITOT 1.3* 0.8  PROT 7.8 7.2  ALBUMIN 3.1* 2.4*   No results for input(s): "LIPASE", "AMYLASE" in the last 168 hours. No results for input(s): "AMMONIA" in the last 168 hours. CBC: Recent Labs  Lab 05/26/22 0933 05/28/22 0246 05/29/22 0906 05/30/22 0613 05/30/22 1203 05/31/22 0838 06/01/22 0453  WBC 18.5* 16.4* 12.6* 11.2*  --  12.5* 12.5*  NEUTROABS 14.8* 13.0* 9.6* 8.4*  --  9.3*  --   HGB 8.7* 7.9* 7.6* 7.0* 7.5* 8.0* 7.7*  HCT 27.0* 24.7* 22.2* 20.8* 22.4* 23.7* 23.4*  MCV 84.9 86.7 81.9 83.5  --  82.9 84.2  PLT 127* 246 324 382  --  553* 583*   Cardiac Enzymes: No results for input(s): "CKTOTAL", "CKMB", "CKMBINDEX", "TROPONINI" in the last 168 hours. BNP: Invalid input(s): "POCBNP" CBG: Recent Labs  Lab 05/30/22 1302  GLUCAP 111*   D-Dimer No results for input(s): "DDIMER" in the last 72 hours. Hgb A1c No results for input(s): "HGBA1C" in  the last 72 hours. Lipid Profile No results for input(s): "CHOL", "HDL", "LDLCALC", "TRIG", "CHOLHDL", "LDLDIRECT" in the last 72 hours. Thyroid function studies No results for input(s): "TSH", "T4TOTAL", "T3FREE", "THYROIDAB" in the last 72 hours.  Invalid input(s): "FREET3" Anemia work up No results for input(s): "VITAMINB12", "FOLATE", "FERRITIN", "TIBC", "IRON", "RETICCTPCT" in the last 72 hours. Urinalysis    Component Value Date/Time   COLORURINE YELLOW 05/22/2022 0449   APPEARANCEUR CLEAR 05/22/2022 0449   LABSPEC 1.038 (H) 05/22/2022 0449   PHURINE 5.0 05/22/2022 0449   GLUCOSEU NEGATIVE 05/22/2022 0449   HGBUR SMALL (A) 05/22/2022 0449   BILIRUBINUR NEGATIVE 05/22/2022 0449   BILIRUBINUR small (A) 11/22/2018 0906   BILIRUBINUR neg 08/02/2015 [place] 05/22/2022 0449   PROTEINUR NEGATIVE 05/22/2022 0449   UROBILINOGEN 1.0 11/22/2018 0906   UROBILINOGEN 0.2 06/19/2014 1556   NITRITE NEGATIVE 05/22/2022 0449   LEUKOCYTESUR TRACE (A) 05/22/2022 0449   Sepsis Labs Recent Labs  Lab 05/29/22 0906 05/30/22 0613 05/31/22 0838 06/01/22 0453  WBC 12.6* 11.2* 12.5* 12.5*   Microbiology Recent Results (from the past 240 hour(s))  Culture, blood (Routine X 2) w Reflex to ID Panel     Status: None   Collection Time: 05/27/22  8:08 AM   Specimen: BLOOD  Result Value Ref Range Status   Specimen Description BLOOD LEFT ANTECUBITAL  Final   Special Requests   Final    BOTTLES DRAWN AEROBIC AND ANAEROBIC Blood Culture results may not be optimal due to an inadequate volume of blood received in culture bottles   Culture   Final    NO GROWTH 5 DAYS Performed at [facility] [address] [place], [place] [address]    Report Status 06/01/2022 FINAL  Final  Culture, blood (Routine X 2) w Reflex to ID Panel     Status: None  Collection Time: 05/27/22  8:11 AM   Specimen: BLOOD LEFT WRIST  Result Value Ref Range Status   Specimen Description BLOOD LEFT  WRIST  Final   Special Requests   Final    BOTTLES DRAWN AEROBIC AND ANAEROBIC Blood Culture results may not be optimal due to an inadequate volume of blood received in culture bottles   Culture   Final    NO GROWTH 5 DAYS Performed at [facility], [place] [address] [place], [place] [address]    Report Status 06/01/2022 FINAL  Final     Time coordinating discharge: Over 30 minutes  SIGNED:   Phillips Climes, MD  [facility] 06/01/2022, 9:53 AM Pager   If 7PM-7AM, please contact night-coverage [email or web address]

## 2022-06-01 NOTE — Plan of Care (Signed)
  Problem: Education: Goal: Knowledge of General Education information will improve Description: Including pain rating scale, medication(s)/side effects and non-pharmacologic comfort measures Outcome: Progressing   Problem: Nutrition: Goal: Adequate nutrition will be maintained Outcome: Progressing   

## 2022-06-01 NOTE — Progress Notes (Signed)
Mobility Specialist Progress Note:   06/01/22 0900  Mobility  Activity Ambulated with assistance in hallway  Level of Assistance Contact guard assist, steadying assist  Assistive Device None  Distance Ambulated (ft) 175 ft  Activity Response Tolerated well  Mobility Referral Yes  $Mobility charge 1 Mobility   Pt eager for mobility session this am. Required only minG assist for ambulation. X1 LOB noted, pt self recovered. Pt back in bed with all needs met. Bed alarm on.     Mobility Specialist Please contact via SecureChat or  Rehab office at [phone]  

## 2022-06-01 NOTE — TOC Progression Note (Signed)
Transition of Care [facility]) - Progression Note    Patient Details  Name: Stephen Bowers MRN: [id number] Date of Birth: 11-21-1960  Transition of Care [facility]) CM/SW [place], LCSW Phone Number: 06/01/2022, 9:44 AM  Clinical Narrative:    CSW spoke with patient regarding alcohol cessation resources. He reported that he is interested and will follow up with the resources listed on his AVS. CSW suggested [facility] as one that accepts patients without insurance. Patient asked about outpatient rehab and CSW made him aware that the info will print on his AVS.    Expected Discharge Plan: OP Rehab Barriers to Discharge: Barriers Resolved  Expected Discharge Plan and Services Expected Discharge Plan: OP Rehab       Living arrangements for the past 2 months: Apartment Expected Discharge Date: 06/01/22                                     Social Determinants of Health (SDOH) Interventions    Readmission Risk Interventions     No data to display

## 2022-06-01 NOTE — Discharge Instructions (Signed)
Follow with Primary MD Ladell Pier, MD in 7 days   Get CBC, CMP,  checked  by Primary MD next visit.    Activity: As tolerated with Full fall precautions use walker/cane & assistance as needed   Disposition Home    Diet: Heart Healthy   On your next visit with your primary care physician please Get Medicines reviewed and adjusted.   Please request your Prim.MD to go over all Hospital Tests and Procedure/Radiological results at the follow up, please get all Hospital records sent to your Prim MD by signing hospital release before you go home.   If you experience worsening of your admission symptoms, develop shortness of breath, life threatening emergency, suicidal or homicidal thoughts you must seek medical attention immediately by calling 911 or calling your MD immediately  if symptoms less severe.  You Must read complete instructions/literature along with all the possible adverse reactions/side effects for all the Medicines you take and that have been prescribed to you. Take any new Medicines after you have completely understood and accpet all the possible adverse reactions/side effects.   Do not drive, operating heavy machinery, perform activities at heights, swimming or participation in water activities or provide baby sitting services if your were admitted for syncope or siezures until you have seen by Primary MD or a Neurologist and advised to do so again.  Do not drive when taking Pain medications.    Do not take more than prescribed Pain, Sleep and Anxiety Medications  Special Instructions: If you have smoked or chewed Tobacco  in the last 2 yrs please stop smoking, stop any regular Alcohol  and or any Recreational drug use.  Wear Seat belts while driving.   Please note  You were cared for by a hospitalist during your hospital stay. If you have any questions about your discharge medications or the care you received while you were in the hospital after you are  discharged, you can call the unit and asked to speak with the hospitalist on call if the hospitalist that took care of you is not available. Once you are discharged, your primary care physician will handle any further medical issues. Please note that NO REFILLS for any discharge medications will be authorized once you are discharged, as it is imperative that you return to your primary care physician (or establish a relationship with a primary care physician if you do not have one) for your aftercare needs so that they can reassess your need for medications and monitor your lab values.

## 2022-06-05 ENCOUNTER — Telehealth: Payer: Self-pay

## 2022-06-05 NOTE — Telephone Encounter (Signed)
Transition Care Management Follow-up Telephone Call Date of discharge and from where: 06/01/2022, [facility] How have you been since you were released from the hospital? He stated he is feeling " well." Any questions or concerns? No  Items Reviewed: Did the pt receive and understand the discharge instructions provided? Yes  Medications obtained and verified? Yes - he said he has all of his medications and did not have any questions about the med regime.  Other? No  Any new allergies since your discharge? No  Dietary orders reviewed? Yes Do you have support at home? Yes   Home Care and Equipment/Supplies: Were home health services ordered? no If so, what is the name of the agency? N/a  Has the agency set up a time to come to the patient's home? not applicable Were any new equipment or medical supplies ordered?  No What is the name of the medical supply agency? N/a Were you able to get the supplies/equipment? not applicable Do you have any questions related to the use of the equipment or supplies? No  Functional Questionnaire: (I = Independent and D = Dependent) ADLs: independent.. He has been referred to outpatient PT and has already contacted them.  They told him they are still reviewing the referral    Follow up appointments reviewed:  PCP Hospital f/u appt confirmed? Yes  Scheduled to see Dr Wynetta Emery - 1/11/20224.  He did not want to reschedule to be seen sooner. He said he is going out of town for the holidays.   [facility] Hospital f/u appt confirmed?  None scheduled at this time  Are transportation arrangements needed? No  If their condition worsens, is the pt aware to call PCP or go to the Emergency Dept.? Yes Was the patient provided with contact information for the PCP's office or ED? Yes Was to pt encouraged to call back with questions or concerns? Yes

## 2022-07-13 ENCOUNTER — Ambulatory Visit: Payer: Self-pay | Attending: Internal Medicine | Admitting: Internal Medicine

## 2022-12-12 ENCOUNTER — Inpatient Hospital Stay (HOSPITAL_COMMUNITY)
Admission: EM | Admit: 2022-12-12 | Discharge: 2022-12-23 | DRG: 897 | Disposition: A | Discharge status: Home Health | Payer: Self-pay | Attending: Internal Medicine | Admitting: Internal Medicine

## 2022-12-12 ENCOUNTER — Other Ambulatory Visit: Payer: Self-pay

## 2022-12-12 ENCOUNTER — Inpatient Hospital Stay (HOSPITAL_COMMUNITY): Payer: Self-pay

## 2022-12-12 ENCOUNTER — Emergency Department (HOSPITAL_COMMUNITY): Payer: Self-pay

## 2022-12-12 ENCOUNTER — Encounter (HOSPITAL_COMMUNITY): Payer: Self-pay | Admitting: Internal Medicine

## 2022-12-12 DIAGNOSIS — D6959 Other secondary thrombocytopenia: Secondary | ICD-10-CM | POA: Diagnosis not present

## 2022-12-12 DIAGNOSIS — K766 Portal hypertension: Secondary | ICD-10-CM | POA: Diagnosis present

## 2022-12-12 DIAGNOSIS — Z811 Family history of alcohol abuse and dependence: Secondary | ICD-10-CM

## 2022-12-12 DIAGNOSIS — K7030 Alcoholic cirrhosis of liver without ascites: Secondary | ICD-10-CM | POA: Diagnosis present

## 2022-12-12 DIAGNOSIS — K269 Duodenal ulcer, unspecified as acute or chronic, without hemorrhage or perforation: Secondary | ICD-10-CM

## 2022-12-12 DIAGNOSIS — I10 Essential (primary) hypertension: Secondary | ICD-10-CM | POA: Diagnosis present

## 2022-12-12 DIAGNOSIS — Z833 Family history of diabetes mellitus: Secondary | ICD-10-CM

## 2022-12-12 DIAGNOSIS — D696 Thrombocytopenia, unspecified: Secondary | ICD-10-CM | POA: Diagnosis present

## 2022-12-12 DIAGNOSIS — E876 Hypokalemia: Secondary | ICD-10-CM | POA: Diagnosis not present

## 2022-12-12 DIAGNOSIS — D689 Coagulation defect, unspecified: Secondary | ICD-10-CM | POA: Diagnosis not present

## 2022-12-12 DIAGNOSIS — Z841 Family history of disorders of kidney and ureter: Secondary | ICD-10-CM

## 2022-12-12 DIAGNOSIS — K921 Melena: Secondary | ICD-10-CM | POA: Diagnosis present

## 2022-12-12 DIAGNOSIS — D62 Acute posthemorrhagic anemia: Secondary | ICD-10-CM

## 2022-12-12 DIAGNOSIS — E7800 Pure hypercholesterolemia, unspecified: Secondary | ICD-10-CM | POA: Diagnosis present

## 2022-12-12 DIAGNOSIS — R5381 Other malaise: Secondary | ICD-10-CM | POA: Diagnosis present

## 2022-12-12 DIAGNOSIS — I959 Hypotension, unspecified: Secondary | ICD-10-CM | POA: Diagnosis present

## 2022-12-12 DIAGNOSIS — D649 Anemia, unspecified: Secondary | ICD-10-CM

## 2022-12-12 DIAGNOSIS — Z789 Other specified health status: Secondary | ICD-10-CM

## 2022-12-12 DIAGNOSIS — Z79899 Other long term (current) drug therapy: Secondary | ICD-10-CM

## 2022-12-12 DIAGNOSIS — K700 Alcoholic fatty liver: Secondary | ICD-10-CM | POA: Diagnosis present

## 2022-12-12 DIAGNOSIS — E872 Acidosis, unspecified: Secondary | ICD-10-CM | POA: Diagnosis present

## 2022-12-12 DIAGNOSIS — F10939 Alcohol use, unspecified with withdrawal, unspecified: Secondary | ICD-10-CM

## 2022-12-12 DIAGNOSIS — N179 Acute kidney failure, unspecified: Secondary | ICD-10-CM | POA: Diagnosis not present

## 2022-12-12 DIAGNOSIS — K922 Gastrointestinal hemorrhage, unspecified: Principal | ICD-10-CM

## 2022-12-12 DIAGNOSIS — F10231 Alcohol dependence with withdrawal delirium: Principal | ICD-10-CM | POA: Diagnosis present

## 2022-12-12 DIAGNOSIS — E871 Hypo-osmolality and hyponatremia: Secondary | ICD-10-CM | POA: Diagnosis not present

## 2022-12-12 DIAGNOSIS — Z87891 Personal history of nicotine dependence: Secondary | ICD-10-CM

## 2022-12-12 DIAGNOSIS — R569 Unspecified convulsions: Secondary | ICD-10-CM | POA: Diagnosis present

## 2022-12-12 HISTORY — DX: Other psychoactive substance abuse, uncomplicated: F19.10

## 2022-12-12 LAB — I-STAT CHEM 8, ED
BUN: 8 mg/dL (ref 6–20)
Calcium, Ion: 1.02 mmol/L — ABNORMAL LOW (ref 1.15–1.40)
Chloride: 106 mmol/L (ref 98–111)
Creatinine, Ser: 1.3 mg/dL — ABNORMAL HIGH (ref 0.61–1.24)
Glucose, Bld: 133 mg/dL — ABNORMAL HIGH (ref 70–99)
HCT: 17 % — ABNORMAL LOW (ref 39.0–52.0)
Hemoglobin: 5.8 g/dL — CL (ref 13.0–17.0)
Potassium: 3.8 mmol/L (ref 3.5–5.1)
Sodium: 135 mmol/L (ref 135–145)
TCO2: 15 mmol/L — ABNORMAL LOW (ref 22–32)

## 2022-12-12 LAB — MAGNESIUM: Magnesium: 1.5 mg/dL — ABNORMAL LOW (ref 1.7–2.4)

## 2022-12-12 LAB — CBC WITH DIFFERENTIAL/PLATELET
Abs Immature Granulocytes: 0.03 10*3/uL (ref 0.00–0.07)
Basophils Absolute: 0.1 10*3/uL (ref 0.0–0.1)
Basophils Relative: 1 %
Eosinophils Absolute: 0 10*3/uL (ref 0.0–0.5)
Eosinophils Relative: 0 %
HCT: 15.3 % — ABNORMAL LOW (ref 39.0–52.0)
Hemoglobin: 4.7 g/dL — CL (ref 13.0–17.0)
Immature Granulocytes: 1 %
Lymphocytes Relative: 21 %
Lymphs Abs: 1.3 10*3/uL (ref 0.7–4.0)
MCH: 27.3 pg (ref 26.0–34.0)
MCHC: 30.7 g/dL (ref 30.0–36.0)
MCV: 89 fL (ref 80.0–100.0)
Monocytes Absolute: 0.3 10*3/uL (ref 0.1–1.0)
Monocytes Relative: 5 %
Neutro Abs: 4.3 10*3/uL (ref 1.7–7.7)
Neutrophils Relative %: 72 %
Platelets: 27 10*3/uL — CL (ref 150–400)
RBC: 1.72 MIL/uL — ABNORMAL LOW (ref 4.22–5.81)
RDW: 22.4 % — ABNORMAL HIGH (ref 11.5–15.5)
WBC: 6 10*3/uL (ref 4.0–10.5)
nRBC: 0.3 % — ABNORMAL HIGH (ref 0.0–0.2)

## 2022-12-12 LAB — BPAM RBC
ISSUE DATE / TIME: 202406112321
Unit Type and Rh: 6200
Unit Type and Rh: 6200

## 2022-12-12 LAB — COMPREHENSIVE METABOLIC PANEL
ALT: 43 U/L (ref 0–44)
AST: 163 U/L — ABNORMAL HIGH (ref 15–41)
Albumin: 3.2 g/dL — ABNORMAL LOW (ref 3.5–5.0)
Alkaline Phosphatase: 26 U/L — ABNORMAL LOW (ref 38–126)
Anion gap: 19 — ABNORMAL HIGH (ref 5–15)
BUN: 9 mg/dL (ref 6–20)
CO2: 13 mmol/L — ABNORMAL LOW (ref 22–32)
Calcium: 8 mg/dL — ABNORMAL LOW (ref 8.9–10.3)
Chloride: 104 mmol/L (ref 98–111)
Creatinine, Ser: 1.11 mg/dL (ref 0.61–1.24)
GFR, Estimated: >60 mL/min (ref >60)
Glucose, Bld: 136 mg/dL — ABNORMAL HIGH (ref 70–99)
Potassium: 3.8 mmol/L (ref 3.5–5.1)
Sodium: 136 mmol/L (ref 135–145)
Total Bilirubin: 0.8 mg/dL (ref 0.3–1.2)
Total Protein: 6.7 g/dL (ref 6.5–8.1)

## 2022-12-12 LAB — PREPARE RBC (CROSSMATCH)

## 2022-12-12 LAB — TYPE AND SCREEN
Antibody Screen: NEGATIVE
Unit division: 0

## 2022-12-12 LAB — LACTIC ACID, PLASMA
Lactic Acid, Venous: 7.2 mmol/L (ref 0.5–1.9)
Lactic Acid, Venous: 4 mmol/L (ref 0.5–1.9)

## 2022-12-12 LAB — BPAM PLATELET PHERESIS: Blood Product Expiration Date: 202406132359

## 2022-12-12 LAB — PROTIME-INR
INR: 1.4 — ABNORMAL HIGH (ref 0.8–1.2)
Prothrombin Time: 17.4 seconds — ABNORMAL HIGH (ref 11.4–15.2)

## 2022-12-12 LAB — LIPASE, BLOOD: Lipase: 71 U/L — ABNORMAL HIGH (ref 11–51)

## 2022-12-12 MED ORDER — PANTOPRAZOLE 80MG IVPB - SIMPLE MED
80.0000 mg | Freq: Once | INTRAVENOUS | Status: AC
  Administered 2022-12-12: 80 mg via INTRAVENOUS
  Filled 2022-12-12: qty 100

## 2022-12-12 MED ORDER — THIAMINE MONONITRATE 100 MG PO TABS: 100.0000 mg | ORAL_TABLET | Freq: Every day | ORAL | Status: DC

## 2022-12-12 MED ORDER — ADULT MULTIVITAMIN W/MINERALS CH
1.0000 | ORAL_TABLET | Freq: Every day | ORAL | Status: DC
  Administered 2022-12-13 – 2022-12-23 (×11): 1 via ORAL
  Filled 2022-12-12 (×11): qty 1

## 2022-12-12 MED ORDER — MAGNESIUM SULFATE 2 GM/50ML IV SOLN
2.0000 g | Freq: Once | INTRAVENOUS | Status: AC
  Administered 2022-12-12: 2 g via INTRAVENOUS
  Filled 2022-12-12: qty 50

## 2022-12-12 MED ORDER — THIAMINE HCL 100 MG/ML IJ SOLN: 100.0000 mg | Freq: Every day | INTRAMUSCULAR | Status: DC

## 2022-12-12 MED ORDER — SODIUM CHLORIDE 0.9 % IV BOLUS
1000.0000 mL | Freq: Once | INTRAVENOUS | Status: AC
  Administered 2022-12-12: 1000 mL via INTRAVENOUS

## 2022-12-12 MED ORDER — ACETAMINOPHEN 325 MG PO TABS
650.0000 mg | ORAL_TABLET | Freq: Four times a day (QID) | ORAL | Status: DC | PRN
  Administered 2022-12-21: 650 mg via ORAL
  Filled 2022-12-12: qty 2

## 2022-12-12 MED ORDER — SODIUM CHLORIDE 0.9% FLUSH
3.0000 mL | Freq: Two times a day (BID) | INTRAVENOUS | Status: DC
  Administered 2022-12-13 – 2022-12-23 (×21): 3 mL via INTRAVENOUS

## 2022-12-12 MED ORDER — LORAZEPAM 1 MG PO TABS: 1.0000 mg | ORAL_TABLET | ORAL | Status: AC | PRN

## 2022-12-12 MED ORDER — LORAZEPAM 2 MG/ML IJ SOLN
1.0000 mg | INTRAMUSCULAR | Status: AC | PRN
  Administered 2022-12-14 – 2022-12-15 (×8): 2 mg via INTRAVENOUS
  Filled 2022-12-12 (×8): qty 1

## 2022-12-12 MED ORDER — ONDANSETRON HCL 4 MG PO TABS: 4.0000 mg | ORAL_TABLET | Freq: Four times a day (QID) | ORAL | Status: DC | PRN

## 2022-12-12 MED ORDER — SENNOSIDES-DOCUSATE SODIUM 8.6-50 MG PO TABS: 1.0000 | ORAL_TABLET | Freq: Every evening | ORAL | Status: DC | PRN

## 2022-12-12 MED ORDER — SODIUM CHLORIDE 0.9 % IV SOLN: INTRAVENOUS | Status: DC

## 2022-12-12 MED ORDER — SODIUM CHLORIDE 0.9% IV SOLUTION: Freq: Once | INTRAVENOUS | Status: DC

## 2022-12-12 MED ORDER — IOHEXOL 350 MG/ML SOLN
100.0000 mL | Freq: Once | INTRAVENOUS | Status: AC | PRN
  Administered 2022-12-12: 100 mL via INTRAVENOUS

## 2022-12-12 MED ORDER — ONDANSETRON HCL 4 MG/2ML IJ SOLN: 4.0000 mg | Freq: Four times a day (QID) | INTRAMUSCULAR | Status: DC | PRN

## 2022-12-12 MED ORDER — ACETAMINOPHEN 650 MG RE SUPP: 650.0000 mg | Freq: Four times a day (QID) | RECTAL | Status: DC | PRN

## 2022-12-12 MED ORDER — FOLIC ACID 1 MG PO TABS
1.0000 mg | ORAL_TABLET | Freq: Every day | ORAL | Status: DC
  Administered 2022-12-13 – 2022-12-23 (×11): 1 mg via ORAL
  Filled 2022-12-12 (×11): qty 1

## 2022-12-12 MED ORDER — PANTOPRAZOLE INFUSION (NEW) - SIMPLE MED
8.0000 mg/h | INTRAVENOUS | Status: DC
  Administered 2022-12-12 – 2022-12-15 (×6): 8 mg/h via INTRAVENOUS
  Filled 2022-12-12 (×10): qty 100

## 2022-12-12 NOTE — Progress Notes (Signed)
Holding off on EEG for now. CODE Stroke arrived to ED. Tech on standby in case Neuro orders an EEG for Stroke patient. EEG will be completed later on tonight.

## 2022-12-12 NOTE — ED Notes (Signed)
ED TO INPATIENT HANDOFF REPORT  ED Nurse Name and Phone #: Pennie Rushing A. Gaige Fussner, RN [phone]  S Name/Age/Gender Stephen Bowers 62 y.o. male Room/Bed: TRAAC/TRAAC  Code Status   Code Status: Full Code  Home/SNF/Other Home Patient oriented to: self, place, time, and situation Is this baseline? Yes   Triage Complete: Triage complete  Chief Complaint Acute blood loss anemia [D62]  Triage Note Pt BIB [facility] for a GI bleed.  EMS endorses at least on scene.  Lots of blood dried on leg.  EMS states that per wife, Pt drank ETOH last night, went to work, had a seizure at work, then came home and wife found him this AM bleeding and weak. PT has hx of hemmroids.  EMS discovered BP of 80/50 on scene.  Gabve of NS.  BP 110/70 on arrival    Allergies No Known Allergies  Level of Care/Admitting Diagnosis ED Disposition     ED Disposition  Admit   Condition  --   Comment  [facility] [[id number]]  Level of Care: Progressive [102]  Admit to Progressive based on following criteria: GI, ENDOCRINE disease patients with GI bleeding, acute liver failure or pancreatitis, stable with diabetic ketoacidosis or thyrotoxicosis (hypothyroid) state.  May admit patient to [facility] or [facility] if equivalent level of care is available:: No  Covid Evaluation: Asymptomatic - no recent exposure (last 10 days) testing not required  Diagnosis: Acute blood loss anemia [454098]  Admitting Physician: Charlsie Quest [[id number]]  Attending Physician: Charlsie Quest [[id number]]  Certification:: I certify this patient will need inpatient services for at least 2 midnights  Estimated Length of Stay: 2          B Medical/Surgery History Past Medical History:  Diagnosis Date   Chronic musculoskeletal pain    CKD (chronic kidney disease)    follows with [facility] [name])   High cholesterol Dx 2011   Hypertension    Pancolitis (HCC)    Wears partial dentures     upper only   Past Surgical History:  Procedure Laterality Date   BIOPSY  07/06/2021   Procedure: BIOPSY;  Surgeon: [name], MD;  Location: [facility] ENDOSCOPY;  Service: Endoscopy;;   COLONOSCOPY  12/2014   hx polyps - Pyrtle   ESOPHAGOGASTRODUODENOSCOPY (EGD) WITH PROPOFOL N/A 07/06/2021   Dr [name]. [facility] ENDOSCOPY. findings: non-bleeding Duodenal ulcer, gastric erosions. Path: mild chronic gastritis, no H Pylori.   MULTIPLE TOOTH EXTRACTIONS       A IV Location/Drains/Wounds Patient Lines/Drains/Airways Status     Active Line/Drains/Airways     Name Placement date Placement time Site Days   Peripheral IV 12/12/22 18 G Anterior;Proximal;Right Forearm 12/12/22  1340  Forearm  less than 1   Peripheral IV 12/12/22 20 G Anterior;Distal;Left;Upper Arm 12/12/22  1415  Arm  less than 1   Peripheral IV 12/12/22 22 G Anterior;Distal;Left Forearm 12/12/22  1745  Forearm  less than 1            Intake/Output Last 24 hours  Intake/Output Summary (Last 24 hours) at 12/12/2022 1926 Last data filed at 12/12/2022 1500 Gross per 24 hour  Intake 1000 ml  Output --  Net 1000 ml    Labs/Imaging Results for orders placed or performed during the hospital encounter of 12/12/22 (from the past 48 hour(s))  Comprehensive metabolic panel     Status: Abnormal   Collection Time: 12/12/22  1:52 PM  Result Value  Ref Range   Sodium 136 135 - 145 mmol/L   Potassium 3.8 3.5 - 5.1 mmol/L   Chloride 104 98 - 111 mmol/L   CO2 13 (L) 22 - 32 mmol/L   Glucose, Bld 136 (H) 70 - 99 mg/dL    Comment: Glucose reference range applies only to samples taken after fasting for at least 8 hours.   BUN 9 6 - 20 mg/dL   Creatinine, Ser 4.09 0.61 - 1.24 mg/dL   Calcium 8.0 (L) 8.9 - 10.3 mg/dL   Total Protein 6.7 6.5 - 8.1 g/dL   Albumin 3.2 (L) 3.5 - 5.0 g/dL   AST 811 (H) 15 - 41 U/L   ALT 43 0 - 44 U/L   Alkaline Phosphatase 26 (L) 38 - 126 U/L   Total Bilirubin 0.8 0.3 - 1.2 mg/dL   GFR, Estimated >91  >47 mL/min    Comment: (NOTE) Calculated using the CKD-EPI Creatinine Equation (2021)    Anion gap 19 (H) 5 - 15    Comment: Performed at [facility], [address] [place], [place] [address]  CBC with Differential     Status: Abnormal   Collection Time: 12/12/22  1:52 PM  Result Value Ref Range   WBC 6.0 4.0 - 10.5 K/uL   RBC 1.72 (L) 4.22 - 5.81 MIL/uL   Hemoglobin 4.7 (LL) 13.0 - 17.0 g/dL    Comment: REPEATED TO VERIFY THIS CRITICAL RESULT HAS VERIFIED AND BEEN CALLED TO J NEWTON,RN BY WALTER BOND ON 06 11 2024 AT 1514, AND HAS BEEN READ BACK.     HCT 15.3 (L) 39.0 - 52.0 %   MCV 89.0 80.0 - 100.0 fL   MCH 27.3 26.0 - 34.0 pg   MCHC 30.7 30.0 - 36.0 g/dL   RDW 21.3 (H) 08.6 - 57.8 %   Platelets 27 (LL) 150 - 400 K/uL    Comment: SPECIMEN CHECKED FOR CLOTS Immature Platelet Fraction may be clinically indicated, consider ordering this additional test [id number] REPEATED TO VERIFY PLATELET COUNT CONFIRMED BY SMEAR CRITICAL RESULT CALLED TO, READ BACK BY AND VERIFIED WITH: J NEWTON,RN 1514 12/12/2022 WBOND    nRBC 0.3 (H) 0.0 - 0.2 %   Neutrophils Relative % 72 %   Neutro Abs 4.3 1.7 - 7.7 K/uL   Lymphocytes Relative 21 %   Lymphs Abs 1.3 0.7 - 4.0 K/uL   Monocytes Relative 5 %   Monocytes Absolute 0.3 0.1 - 1.0 K/uL   Eosinophils Relative 0 %   Eosinophils Absolute 0.0 0.0 - 0.5 K/uL   Basophils Relative 1 %   Basophils Absolute 0.1 0.0 - 0.1 K/uL   WBC Morphology MORPHOLOGY UNREMARKABLE    RBC Morphology MORPHOLOGY UNREMARKABLE    Smear Review PLATELET COUNT CONFIRMED BY SMEAR    Immature Granulocytes 1 %   Abs Immature Granulocytes 0.03 0.00 - 0.07 K/uL    Comment: Performed at [facility], [address], [place], [place] [address]  Protime-INR     Status: Abnormal   Collection Time: 12/12/22  1:52 PM  Result Value Ref Range   Prothrombin Time 17.4 (H) 11.4 - 15.2 seconds   INR 1.4 (H) 0.8 - 1.2    Comment: (NOTE) INR goal varies based on device  and disease states. Performed at [facility], [address], [place], [place] [address]   Lipase, blood     Status: Abnormal   Collection Time: 12/12/22  1:52 PM  Result Value  Ref Range   Lipase 71 (H) 11 - 51 U/L    Comment: Performed at [facility], [address] [place], [place] [address]  Magnesium     Status: Abnormal   Collection Time: 12/12/22  1:52 PM  Result Value Ref Range   Magnesium 1.5 (L) 1.7 - 2.4 mg/dL    Comment: Performed at [facility], [address], [place], [place] [address]  Type and screen [facility]     Status: None (Preliminary result)   Collection Time: 12/12/22  2:17 PM  Result Value Ref Range   ABO/RH(D) A POS    Antibody Screen NEG    Sample Expiration 12/15/2022,2359    Unit Number [id number]    Blood Component Type RED CELLS,LR    Unit division 00    Status of Unit ALLOCATED    Transfusion Status OK TO TRANSFUSE    Crossmatch Result      Compatible Performed at [facility], [address] [place], [place] [address]    Unit Number [id number]    Blood Component Type RED CELLS,LR    Unit division 00    Status of Unit ISSUED    Transfusion Status OK TO TRANSFUSE    Crossmatch Result Compatible   Lactic acid, plasma     Status: Abnormal   Collection Time: 12/12/22  2:38 PM  Result Value Ref Range   Lactic Acid, Venous 7.2 (HH) 0.5 - 1.9 mmol/L    Comment: CRITICAL RESULT CALLED TO, READ BACK BY AND VERIFIED WITH J.NEWTON,RN @1519  12/12/2022 VANG.J Performed at [facility], [address] [place], [place] [address]   I-Stat Chem 8, ED     Status: Abnormal   Collection Time: 12/12/22  2:55 PM  Result Value Ref Range   Sodium 135 135 - 145 mmol/L   Potassium 3.8 3.5 - 5.1 mmol/L   Chloride 106 98 - 111 mmol/L   BUN 8 6 - 20 mg/dL   Creatinine, Ser 6.64 (H) 0.61 - 1.24 mg/dL   Glucose, Bld 403 (H) 70 - 99 mg/dL    Comment: Glucose reference range applies only to samples taken  after fasting for at least 8 hours.   Calcium, Ion 1.02 (L) 1.15 - 1.40 mmol/L   TCO2 15 (L) 22 - 32 mmol/L   Hemoglobin 5.8 (LL) 13.0 - 17.0 g/dL   HCT 47.4 (L) 25.9 - 56.3 %   Comment NOTIFIED PHYSICIAN   Prepare RBC (crossmatch)     Status: None   Collection Time: 12/12/22  4:00 PM  Result Value Ref Range   Order Confirmation      ORDER PROCESSED BY BLOOD BANK Performed at [facility], [address], [place], [place] [address]   Prepare platelet pheresis     Status: None (Preliminary result)   Collection Time: 12/12/22  5:05 PM  Result Value Ref Range   Unit Number [id number]    Blood Component Type PLTP1 PSORALEN TREATED    Unit division 00    Status of Unit ISSUED    Transfusion Status      OK TO TRANSFUSE Performed at [facility], [address], [place], [place] [address]    CT ANGIO GI BLEED  Result Date: 12/12/2022 CLINICAL DATA:  Acute mesenteric ischemia. GI bleed. EXAM: CTA ABDOMEN AND PELVIS WITHOUT AND WITH CONTRAST TECHNIQUE: Multidetector CT imaging of the abdomen and pelvis was performed using the standard protocol during  bolus administration of intravenous contrast. Multiplanar reconstructed images and MIPs were obtained and reviewed to evaluate the vascular anatomy. RADIATION DOSE REDUCTION: This exam was performed according to the departmental dose-optimization program which includes automated exposure control, adjustment of the mA and/or kV according to patient size and/or use of iterative reconstruction technique. CONTRAST:  OMNIPAQUE IOHEXOL 350 MG/ML SOLN COMPARISON:  Abdominopelvic CT 05/22/2022 FINDINGS: VASCULAR Aorta: Moderately calcified. Normal caliber aorta without aneurysm, dissection, vasculitis or significant stenosis. Celiac: Patent without evidence of aneurysm, dissection, or vasculitis. Mild stenosis at the origin due to the diaphragmatic crus. SMA: Patent without evidence of aneurysm, dissection, vasculitis or significant  stenosis. Renals: Single right and 2 left renal arteries. All renal arteries are patent. Mild atherosclerosis. No dissection, aneurysm or vasculitis. IMA: Patent without evidence of aneurysm, dissection, vasculitis or significant stenosis. Inflow: Patent without evidence of aneurysm, dissection, vasculitis or significant stenosis. Proximal Outflow: Bilateral common femoral and visualized portions of the superficial and profunda femoral arteries are patent without evidence of aneurysm, dissection, vasculitis or significant stenosis. Veins: Venous phase imaging demonstrates patency of the iliac veins and IVC. No thrombus within the mesenteric or portal veins. The splenic vein is patent. Review of the MIP images confirms the above findings. NON-VASCULAR Lower chest: No basilar airspace disease or pleural effusion. Normal heart size with coronary artery calcifications. Hepatobiliary: Advanced hepatic steatosis. Focal fatty sparing adjacent to the gallbladder fossa. No focal liver abnormality Gallbladder physiologically distended, no calcified stone. No biliary dilatation. Pancreas: No ductal dilatation or inflammation. Spleen: No splenomegaly. No focal splenic abnormality. Adrenals/Urinary Tract: No adrenal nodule. There is chronic bilateral perinephric edema. No hydronephrosis. Subcentimeter cyst from the posterior left kidney a punctate calcification and is unchanged from prior exam. No specific imaging follow-up is needed. Unremarkable urinary bladder. Stomach/Bowel: There is no contrast accumulating in the GI tract to localize site of GI bleed. No bowel wall thickening or pneumatosis to suggest mesenteric ischemia. No bowel inflammation or obstruction. Again seen wall thickening of the distal esophagus. The appendix is normal. Lymphatic: No adenopathy. Reproductive: Prostate is unremarkable. Other: No ascites. Mild patchy soft tissue edema in the left flank. Bilateral perinephric and retroperitoneal stranding is  chronic and unchanged. Fat in both inguinal canals. Small subcutaneous lipoma overlies the right hip, chronic. Musculoskeletal: There are no acute or suspicious osseous abnormalities. IMPRESSION: 1. No contrast accumulating in the GI tract to localize site of GI bleed. No evidence of bowel inflammation, pneumatosis, or bowel wall thickening to suggest mesenteric ischemia. 2. Wall thickening about the distal esophagus can be seen with reflux or esophagitis. 3. Chronic bilateral perinephric stranding. 4. Aortic atherosclerosis.  Coronary artery calcifications. 5. Advanced hepatic steatosis. Aortic Atherosclerosis (ICD10-I70.0). Electronically Signed   By: Narda Rutherford M.D.   On: 12/12/2022 18:05    Pending Labs Unresulted Labs (From admission, onward)     Start     Ordered   12/13/22 0500  HIV Antibody (routine testing w rflx)  (HIV Antibody (Routine testing w reflex) panel)  Tomorrow morning,   R        12/12/22 1924   12/13/22 0500  Comprehensive metabolic panel  Tomorrow morning,   R        12/12/22 1924   12/13/22 0500  CBC  Tomorrow morning,   R        12/12/22 1924   12/13/22 0500  Protime-INR  Tomorrow morning,   R        12/12/22 1924  Vitals/Pain Today's Vitals   12/12/22 1615 12/12/22 1622 12/12/22 1630 12/12/22 1901  BP: 113/73 117/69 110/76 131/76  Pulse: 91 88 99 80  Resp: 17 19 19 14   Temp:  97.8 F (36.6 C)  98 F (36.7 C)  TempSrc:  Oral  Oral  SpO2: 100% 100% 100%   PainSc:        Isolation Precautions No active isolations  Medications Medications  sodium chloride 0.9 % bolus 1,000 mL (0 mLs Intravenous Stopped 12/12/22 1500)    And  0.9 %  sodium chloride infusion ( Intravenous New Bag/Given 12/12/22 1502)  pantoprozole (PROTONIX) 80 mg /NS 100 mL infusion (8 mg/hr Intravenous New Bag/Given 12/12/22 1559)  0.9 %  sodium chloride infusion (Manually program via Guardrails IV Fluids) (has no administration in time range)  0.9 %  sodium chloride  infusion (Manually program via Guardrails IV Fluids) (has no administration in time range)  sodium chloride flush (NS) 0.9 % injection 3 mL (has no administration in time range)  ondansetron (ZOFRAN) tablet 4 mg (has no administration in time range)    Or  ondansetron (ZOFRAN) injection 4 mg (has no administration in time range)  senna-docusate (Senokot-S) tablet 1 tablet (has no administration in time range)  acetaminophen (TYLENOL) tablet 650 mg (has no administration in time range)    Or  acetaminophen (TYLENOL) suppository 650 mg (has no administration in time range)  LORazepam (ATIVAN) tablet 1-4 mg (has no administration in time range)    Or  LORazepam (ATIVAN) injection 1-4 mg (has no administration in time range)  thiamine (VITAMIN B1) tablet 100 mg (has no administration in time range)    Or  thiamine (VITAMIN B1) injection 100 mg (has no administration in time range)  folic acid (FOLVITE) tablet 1 mg (has no administration in time range)  multivitamin with minerals tablet 1 tablet (has no administration in time range)  pantoprazole (PROTONIX) 80 mg /NS 100 mL IVPB (0 mg Intravenous Stopped 12/12/22 1625)  iohexol (OMNIPAQUE) 350 MG/ML injection 100 mL (100 mLs Intravenous Contrast Given 12/12/22 1653)  magnesium sulfate IVPB 2 g 50 mL (0 g Intravenous Stopped 12/12/22 1902)    Mobility walks with person assist     Focused Assessments Coming in for GI bleed   R Recommendations: See Admitting Provider Note  Report given to:   Additional Notes: Call or epic message for any additional details

## 2022-12-12 NOTE — H&P (Signed)
History and Physical    [name]:[id number] DOB: August 11, 1960 DOA: 12/12/2022  PCP: [name], MD  Patient coming from: Home  I have personally briefly reviewed patient's old medical records in [facility]  Chief Complaint: Rectal bleeding  HPI: Stephen Bowers is a 62 y.o. male with medical history significant for alcohol use disorder, alcohol associated fatty liver, history of GI bleed from duodenal ulcer, HTN, HLD, pancreatitis who presented to the ED for evaluation of hematochezia and seizure.  Patient reports new onset bleeding from his rectum this morning.  Reports seen by red blood per rectum.  This has been painless.  He has had some lightheadedness but has not passed out.  He denies nausea, vomiting abdominal pain.  He denies use of blood thinners including aspirin or NSAIDs.  He denies any similar episodes in the past.  He reports drinking alcohol usually on the weekends.  He says last drink was this past Sunday.  EMS were initially called to his home this morning after initial rectal bleeding episode..  Documentation patient exsanguinated 200 ccs of blood per rectum.  Patient refused EMS transport at the time.  He reportedly went to work and had a seizure at work.  He returned home and his wife found him bleeding very weak.  EMS were again called and BP was 80/50 on scene.  He received 500 cc normal saline with improvement in blood pressure.  ED Course  Labs/Imaging on admission: I have personally reviewed following labs and imaging studies.  Initial vitals showed BP 104/64, pulse 71, RR 11, temp 97.5 F, SpO2 100% on room air.  Labs show hemoglobin 4.7, platelets 27,000, WBC 6.0, sodium 136, potassium 3.8, bicarb 13, BUN 9, creatinine 1.11, serum glucose 136, AST 163, ALT 43, alk phos 26, total bilirubin 0.8, INR 1.4, lipase 71, magnesium 1.5, lactic acid 7.2.  CT angio GI bleed study did not show localized GI bleed.  No evidence of bowel inflammation,  pneumatosis, bowel wall thickening to suggest mesenteric ischemia.  Distal esophagus wall thickening noted.  Chronic bilateral perinephric stranding also seen.  Advanced hepatic steatosis reported.  Patient was given 1 L normal saline, IV magnesium 2 g, started on IV Protonix infusion.  Patient was ordered to receive 2 unit PRBC transfusion and 1 unit platelets.  [facility] were consulted and patient was seen by Dr. Barron Alvine, their services are following.  Neurology also consulted.  EEG ordered.  The hospitalist service was consulted to admit for further evaluation and management.  Review of Systems: All systems reviewed and are negative except as documented in history of present illness above.   Past Medical History:  Diagnosis Date   Chronic musculoskeletal pain    CKD (chronic kidney disease)    follows with [name])   High cholesterol Dx 2011   Hypertension    Pancolitis [facility])    Wears partial dentures    upper only    Past Surgical History:  Procedure Laterality Date   BIOPSY  07/06/2021   Procedure: BIOPSY;  Surgeon: [name], MD;  Location: [facility] ENDOSCOPY;  Service: Endoscopy;;   COLONOSCOPY  12/2014   hx polyps - Pyrtle   ESOPHAGOGASTRODUODENOSCOPY (EGD) WITH PROPOFOL N/A 07/06/2021   Dr [name]. [facility] ENDOSCOPY. findings: non-bleeding Duodenal ulcer, gastric erosions. Path: mild chronic gastritis, no H Pylori.   MULTIPLE TOOTH EXTRACTIONS      Social History:  reports that he quit smoking about 29 years ago. His  smoking use included cigarettes. He has a 1.50 pack-year smoking history. He has never used smokeless tobacco. He reports current alcohol use of about 6.0 standard drinks of alcohol per week. He reports that he does not use drugs.  No Known Allergies  Family History  Problem Relation Age of Onset   Liver disease Father    Alcohol abuse Father    Diabetes Sister    Obesity Sister    Cancer Mother        breast    Kidney disease Mother     Colon cancer Neg Hx    Esophageal cancer Neg Hx    Rectal cancer Neg Hx    Stomach cancer Neg Hx    Colon polyps Neg Hx      Prior to Admission medications   Medication Sig Start Date End Date Taking? Authorizing Provider  amLODipine (NORVASC) 10 MG tablet TAKE 1 TABLET (10 MG TOTAL) BY MOUTH DAILY. Patient taking differently: Take 10 mg by mouth daily. 07/28/21 02/10/23 Yes [name], MD  Ascorbic Acid (VITAMIN C PO) Take 1 tablet by mouth daily.   Yes [provider]  Cholecalciferol (VITAMIN D-3 PO) Take 1 capsule by mouth daily.   Yes [provider]  diphenhydramine-acetaminophen (TYLENOL PM) 25-500 MG TABS tablet Take 2 tablets by mouth at bedtime as needed (sleep, pain).   Yes [provider]  ELDERBERRY PO Take 1 capsule by mouth daily.   Yes [provider]  Multiple Vitamin (MULTIVITAMIN WITH MINERALS) TABS tablet Take 1 tablet by mouth daily.   Yes [provider]  Multiple Vitamins-Minerals (ZINC PO) Take 1 tablet by mouth daily.   Yes [provider]  VITAMIN E PO Take 1 capsule by mouth daily.   Yes [provider]  colchicine 0.6 MG tablet TAKE 1 TABLET BY MOUTH EVERY OTHER DAY ON MONDAY, WEDNESDAY, AND FRIDAY. Patient not taking: Reported on 12/12/2022 05/07/20 05/22/22  [name], MD  ferrous sulfate 325 (65 FE) MG tablet Take 1 tablet (325 mg total) by mouth 2 (two) times daily with a meal. Patient not taking: Reported on 12/12/2022 07/28/21 05/22/22  [name], MD  folic acid (FOLVITE) 1 MG tablet Take 1 tablet (1 mg total) by mouth daily. Patient not taking: Reported on 12/12/2022 07/08/21   Osvaldo Shipper, MD  pantoprazole (PROTONIX) 40 MG tablet Take 1 tablet (40 mg total) by mouth 2 (two) times daily before a meal. Patient not taking: Reported on 12/12/2022 06/01/22   Elgergawy, Leana Roe, MD  thiamine (VITAMIN B1) 100 MG tablet Take 1 tablet (100 mg total) by mouth daily. Patient not  taking: Reported on 12/12/2022 06/01/22   Elgergawy, Leana Roe, MD    Physical Exam: Vitals:   12/12/22 1920 12/12/22 1930 12/12/22 1940 12/12/22 2010  BP:  132/84  135/75  Pulse: 90 79 81 77  Resp: 14 14 14  (!) 8  Temp:      TempSrc:      SpO2: 100% 100% 100% 100%   Constitutional: Sitting up in bed, NAD, calm, comfortable Eyes: EOMI, lids and conjunctivae normal ENMT: Mucous membranes are moist. Posterior pharynx clear of any exudate or lesions.Normal dentition.  Neck: normal, supple, no masses. Respiratory: clear to auscultation bilaterally, no wheezing, no crackles. Normal respiratory effort. No accessory muscle use.  Cardiovascular: Regular rate and rhythm, no murmurs / rubs / gallops. No extremity edema. 2+ pedal pulses. Abdomen: no tenderness, no masses palpated.  Musculoskeletal: no clubbing /  cyanosis. No joint deformity upper and lower extremities. Good ROM, no contractures. Normal muscle tone.  Skin: no rashes, lesions, ulcers. No induration Neurologic: Tremulous upper extremities.  Sensation intact. Strength 5/5 in all 4.  Psychiatric: Alert and oriented x 3. Normal mood.   EKG: Personally reviewed. Sinus rhythm, rate 81, no acute ischemic changes.  Assessment/Plan Principal Problem:   Acute blood loss anemia Active Problems:   Alcohol withdrawal seizure (HCC)   Thrombocytopenia (HCC)   Symptomatic anemia   Hematochezia   ABLA (acute blood loss anemia)   Lactic acidosis   BRODIX SALVINO is a 62 y.o. male with medical history significant for alcohol use disorder, alcohol associated fatty liver, history of GI bleed from duodenal ulcer, HTN, HLD, pancreatitis who is admitted with symptomatic anemia due to acute GI bleed with thrombocytopenia.  Assessment and Plan: Acute blood loss anemia due to GI bleeding: Presenting with hematochezia.  Hemoglobin 4.7 and platelets 27.  Suspicion is for lower GI bleeding.  CTA is unrevealing.  Patient denies use of blood thinners  or NSAIDs. -Transfusing 2 unit PRBCs -Continue IV fluid resuscitation. -Continue IV Protonix infusion for now -Clear liquid diet now, will make n.p.o. after midnight -GI following  Thrombocytopenia: Platelets 27,000 on admission, previously 583,000 six months ago.  Suspicion is for splenic sequestration from underlying cirrhosis. -Transfusing 1 unit PRBC  Seizure: Patient reportedly had a seizure episode while at work prior to presentation.  Concern is for alcohol withdrawal seizure.  EEG ordered.  Alcohol use disorder: As above, concern for alcohol withdrawal with seizure prior to presentation.  Patient reports last drink was 6/9. -Started on CIWA protocol with Ativan as needed -Continue thiamine, folate, MVM  Lactic acidosis: Lactic acid 7.5.  He has not been hypotensive.  Suspect this is secondary to seizure activity.  Continue IV fluids and blood product transfusion as above.  Trend lactic acid.  Alcohol associated cirrhosis: No evidence of decompensated cirrhosis or hepatic encephalopathy.  GI following.  Hypertension: Antihypertensives on hold due to high risk for hypotension in setting of GI bleed.   DVT prophylaxis: SCDs Start: 12/12/22 1924 Code Status: Full code, confirmed with patient on admission Family Communication: Discussed with patient, he has discussed with family Disposition Plan: From home, dispo pending clinical progress Consults called: [facility] Severity of Illness: The appropriate patient status for this patient is INPATIENT. Inpatient status is judged to be reasonable and necessary in order to provide the required intensity of service to ensure the patient's safety. The patient's presenting symptoms, physical exam findings, and initial radiographic and laboratory data in the context of their chronic comorbidities is felt to place them at high risk for further clinical deterioration. Furthermore, it is not anticipated that the patient will be medically  stable for discharge from the hospital within 2 midnights of admission.   * I certify that at the point of admission it is my clinical judgment that the patient will require inpatient hospital care spanning beyond 2 midnights from the point of admission due to high intensity of service, high risk for further deterioration and high frequency of surveillance required.Darreld Mclean MD [facility]  If 7PM-7AM, please contact night-coverage [email or web address]  12/12/2022, 8:58 PM

## 2022-12-12 NOTE — Hospital Course (Signed)
Stephen Bowers is a 62 y.o. male with medical history significant for alcohol use disorder, alcohol associated fatty liver, history of GI bleed from duodenal ulcer, HTN, HLD, pancreatitis who is admitted with symptomatic anemia due to acute GI bleed with thrombocytopenia.

## 2022-12-12 NOTE — Consult Note (Addendum)
Attending physician's note   I have taken a history, reviewed the chart, and examined the patient. I performed a substantive portion of this encounter, including complete performance of at least one of the key components, in conjunction with the APP. I agree with the APP's note, impression, and recommendations with my edits.   62 year old male with medical history as outlined below to include history of HTN, CKD, HLD, EtOH cirrhosis c/b thrombocytopenia, PUD, presenting with painless hematochezia which started earlier today.  He was otherwise in his usual state of health prior to symptoms onset.  Denies any associated abdominal pain, nausea/vomiting, change in bowel habits.  Denies any anticoagulation, antiplatelet therapy, NSAIDs.  Denies prior similar symptoms.  Still actively drinking with last drink a few days ago.  Has had UGI bleed in the past, including hospital admission in 07/2021 with upper GI bleed and EGD at that time notable for hiatal hernia, gastric erosions, 5 mm duodenal ulcer, along medically managed.  Has had 2 colonoscopies, with most recent in 03/2018 notable for 3 small hyperplastic polyps, internal hemorrhoids, with recommended repeat in 5 years.  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  Admission evaluation notable for the following: - H/H 4.7/15.3, PLT 27 - INR 1.4 - NA 136, BUN/creatinine 9/1.1 - AST/ALT 163/43, ALP 26, T. bili 0.8, albumin 3.2 - Lipase 71 - Lactate 7.2  Last set of comparison labs was from hospital discharge in 05/2022 (admission for COVID): - H/H 7.7/23.4 - BUN/creatinine 7/0.7 - AST/ALT 70/37, albumin 2.4,, T. bili 0.8, PLT 583, PT/INR 16/1.3   1) Hematochezia 2) Acute blood loss anemia 3) Hypotension 4) Lactic acidosis Discussed DDx for hematochezia to include brisk hemorrhoidal bleed, diverticular bleed (although no history of  diverticulosis on colonoscopy x 2), rectal varices, and less likely ischemic colitis, IBD.  Discussed possibility of brisk UGI bleed, but otherwise no UGI symptoms, normal BUN/creatinine.  Plan for the following: - PRBC transfusion now - IV fluids - CT angio - If CTA unrevealing, may need to consider colonoscopy if bleeding persists - Continue serial CBC checks with blood products as needed per protocol - Trend serial lactic  5) Thrombocytopenia Presumably 2/2 splenic sequestration from underlying EtOH cirrhosis.  However, PLT 27 much below his baseline - May require PLT transfusion given active bleeding - Continue trending  6) EtOH cirrhosis 7) Coagulopathy No varices or portal hypertensive gastropathy on EGD in 07/2021.  Continued EtOH use with uptrending AST/ALT compared with previous.  MELD 3.0 is 11 CTP is A MDF is 21  - Trend daily enzymes - Needs to quit EtOH completely - CIWA protocol  GI service will continue to follow  Doristine Locks, DO, FACG [phone] office          Consultation  Referring Provider:   Dr. Karene Fry Primary Care Physician:  [name], MD Primary Gastroenterologist: Dr. [name]        Reason for Consultation: Hematochezia, acute blood loss anemia, history of cirrhosis            HPI:   Stephen Bowers is a 62 y.o. male with a past medical history as listed below including cirrhosis, CKD, pancolitis and others listed below, who presented to the ER today with a complaint of hematochezia and found to have acute blood loss anemia.    Today, the patient describes that he started bleeding just today somewhat  out of the blue, maybe a little bit of lower abdominal discomfort before his bowel movement but then it was just a lot of bright red blood which brought him into the ER.  Since being here he has not passed any further stools.  Explains that he had been feeling like his normal self.  He is always tired from working but this was not any  increased recently.  Admits to continued alcohol use, when we are in the room tells [place] he had some Budweiser's and airplane bottles last weekend but nothing over the past few days.  Does take a Tylenol PM to sleep but denies any NSAID use.    Denies fever, chills, weight loss, change in bowel habits including diarrhea or constipation, palpitations or dizziness.     GI history: 05/22/2022 consultation by our service for cirrhosis and hematemesis-at that time known to our practice from an admission in January 2023 during which he had an EGD as well as alcohol related cirrhosis with ongoing alcohol use.  At that admission being treated for COVID.  He reported possible vomiting and hematemesis at home-at that time observation was recommended 07/06/2021 EGD with Dr. Marina Goodell done for iron deficiency anemia secondary to chronic blood loss and heme positive stool with duodenal ulcer without active bleeding, gastric erosions-recommendations pantoprazole 40 daily indefinitely, stop drinking alcohol in excess 03/07/2018 colonoscopy with three 2-5 mm polyps in the rectum, sigmoid colon, transverse colon and small internal hemorrhoids  Past Medical History:  Diagnosis Date   Chronic musculoskeletal pain    CKD (chronic kidney disease)    follows with [name])   High cholesterol Dx 2011   Hypertension    Pancolitis (HCC)    Wears partial dentures    upper only    Past Surgical History:  Procedure Laterality Date   BIOPSY  07/06/2021   Procedure: BIOPSY;  Surgeon: [name], MD;  Location: [facility] ENDOSCOPY;  Service: Endoscopy;;   COLONOSCOPY  12/2014   hx polyps - Pyrtle   ESOPHAGOGASTRODUODENOSCOPY (EGD) WITH PROPOFOL N/A 07/06/2021   Dr [name]. [facility] ENDOSCOPY. findings: non-bleeding Duodenal ulcer, gastric erosions. Path: mild chronic gastritis, no H Pylori.   MULTIPLE TOOTH EXTRACTIONS      Family History  Problem Relation Age of Onset   Liver disease Father    Alcohol abuse Father     Diabetes Sister    Obesity Sister    Cancer Mother        breast    Kidney disease Mother    Colon cancer Neg Hx    Esophageal cancer Neg Hx    Rectal cancer Neg Hx    Stomach cancer Neg Hx    Colon polyps Neg Hx      Social History   Tobacco Use   Smoking status: Former    Packs/day: 0.25    Years: 6.00    Additional pack years: 0.00    Total pack years: 1.50    Types: Cigarettes    Quit date: 1995    Years since quitting: 29.4   Smokeless tobacco: Never  Vaping Use   Vaping Use: Never used  Substance Use Topics   Alcohol use: Yes    Alcohol/week: 6.0 standard drinks of alcohol    Types: 6 Cans of beer per week    Comment: 6 drinks Beer /Liquor   Drug use: No    Prior to Admission medications   Medication Sig Start Date End Date Taking? Authorizing Provider  amLODipine (NORVASC) 10 MG tablet TAKE 1 TABLET (10 MG TOTAL) BY MOUTH DAILY. Patient taking differently: Take 10 mg by mouth daily. 07/28/21 07/28/22  [name], MD  Ascorbic Acid (VITAMIN C PO) Take 1 tablet by mouth daily.    [provider]  cholecalciferol (VITAMIN D3) 25 MCG (1000 UNIT) tablet Take 1,000 Units by mouth daily.    [provider]  colchicine 0.6 MG tablet TAKE 1 TABLET BY MOUTH EVERY OTHER DAY ON MONDAY, WEDNESDAY, AND FRIDAY. Patient taking differently: Take 0.6 mg by mouth every other day. 05/07/20 05/22/22  [name], MD  ELDERBERRY PO Take 1 tablet by mouth daily.    [provider]  ferrous sulfate 325 (65 FE) MG tablet Take 1 tablet (325 mg total) by mouth 2 (two) times daily with a meal. 07/28/21 05/22/22  [name], MD  folic acid (FOLVITE) 1 MG tablet Take 1 tablet (1 mg total) by mouth daily. 07/08/21   Osvaldo Shipper, MD  Levocetirizine Dihydrochloride (XYZAL PO) Take 1 tablet by mouth daily as needed (allergies).    [provider]  Multiple Vitamin (MULTIVITAMIN WITH MINERALS) TABS tablet Take 1 tablet by mouth daily.     [provider]  Multiple Vitamins-Minerals (ZINC PO) Take 1 tablet by mouth daily.    [provider]  pantoprazole (PROTONIX) 40 MG tablet Take 1 tablet (40 mg total) by mouth 2 (two) times daily before a meal. 06/01/22   Elgergawy, Leana Roe, MD  Tetrahydrozoline HCl (VISINE OP) Place 1 drop into both eyes daily as needed (dry eyes).     [provider]  thiamine (VITAMIN B1) 100 MG tablet Take 1 tablet (100 mg total) by mouth daily. 06/01/22   Elgergawy, Leana Roe, MD  vitamin E 1000 UNIT capsule Take 1,000 Units by mouth daily.    [provider]    Current Facility-Administered Medications  Medication Dose Route Frequency Provider Last Rate Last Admin   0.9 %  sodium chloride infusion (Manually program via Guardrails IV Fluids)   Intravenous Once Ernie Avena, MD       0.9 %  sodium chloride infusion   Intravenous Continuous Ernie Avena, MD 125 mL/hr at 12/12/22 1502 New Bag at 12/12/22 1502   pantoprozole (PROTONIX) 80 mg /NS 100 mL infusion  8 mg/hr Intravenous Continuous Ernie Avena, MD       Current Outpatient Medications  Medication Sig Dispense Refill   amLODipine (NORVASC) 10 MG tablet TAKE 1 TABLET (10 MG TOTAL) BY MOUTH DAILY. (Patient taking differently: Take 10 mg by mouth daily.) 30 tablet 6   Ascorbic Acid (VITAMIN C PO) Take 1 tablet by mouth daily.     cholecalciferol (VITAMIN D3) 25 MCG (1000 UNIT) tablet Take 1,000 Units by mouth daily.     colchicine 0.6 MG tablet TAKE 1 TABLET BY MOUTH EVERY OTHER DAY ON MONDAY, WEDNESDAY, AND FRIDAY. (Patient taking differently: Take 0.6 mg by mouth every other day.) 30 tablet 1   ELDERBERRY PO Take 1 tablet by mouth daily.     ferrous sulfate 325 (65 FE) MG tablet Take 1 tablet (325 mg total) by mouth 2 (two) times daily with a meal. 60 tablet 3   folic acid (FOLVITE) 1 MG tablet Take 1 tablet (1 mg total) by mouth daily. 30 tablet 1   Levocetirizine Dihydrochloride (XYZAL PO) Take 1 tablet  by mouth daily as needed (allergies).     Multiple Vitamin (MULTIVITAMIN WITH MINERALS) TABS tablet  Take 1 tablet by mouth daily.     Multiple Vitamins-Minerals (ZINC PO) Take 1 tablet by mouth daily.     pantoprazole (PROTONIX) 40 MG tablet Take 1 tablet (40 mg total) by mouth 2 (two) times daily before a meal. 60 tablet 1   Tetrahydrozoline HCl (VISINE OP) Place 1 drop into both eyes daily as needed (dry eyes).      thiamine (VITAMIN B1) 100 MG tablet Take 1 tablet (100 mg total) by mouth daily. 30 tablet 1   vitamin E 1000 UNIT capsule Take 1,000 Units by mouth daily.      Allergies as of 12/12/2022   (No Known Allergies)     Review of Systems:    Constitutional: No weight loss, fever or chills Skin: No rash  Cardiovascular: No chest pain Respiratory: No SOB  Gastrointestinal: See HPI and otherwise negative Genitourinary: No dysuria  Neurological: No headache, dizziness or syncope Musculoskeletal: No new muscle or joint pain Hematologic: No bruising Psychiatric: No history of depression or anxiety    Physical Exam:  Vital signs in last 24 hours: Pulse Rate:  [76-81] 76 (06/11 1420) Resp:  [11-16] 11 (06/11 1420) BP: (92-104)/(63-64) 92/64 (06/11 1420) SpO2:  [100 %] 100 % (06/11 1420)   General:   Pleasant AA male appears to be in NAD, Well developed, Well nourished, alert and cooperative Head:  Normocephalic and atraumatic. Eyes:   PEERL, EOMI. No icterus. Conjunctiva pink. Ears:  Normal auditory acuity. Neck:  Supple Throat: Oral cavity and pharynx without inflammation, swelling or lesion.  Lungs: Respirations even and unlabored. Lungs clear to auscultation bilaterally.   No wheezes, crackles, or rhonchi.  Heart: Normal S1, S2. No MRG. Regular rate and rhythm. No peripheral edema, cyanosis or pallor.  Abdomen:  Soft, nondistended, nontender. No rebound or guarding. Normal bowel sounds. No appreciable masses or hepatomegaly. Rectal:  External: bloody residue around  rectum, no active bleeding, no hemorrhoids; Internal: no abnormality Msk:  Symmetrical without gross deformities. Peripheral pulses intact.  Extremities:  Without edema, no deformity or joint abnormality.  Neurologic:  Alert and  oriented x4;  grossly normal neurologically.  Skin:   Dry and intact without significant lesions or rashes. Psychiatric: Demonstrates good judgement and reason without abnormal affect or behaviors.   LAB RESULTS: Recent Labs    12/12/22 1352 12/12/22 1455  WBC 6.0  --   HGB 4.7* 5.8*  HCT 15.3* 17.0*  PLT 27*  --    BMET Recent Labs    12/12/22 1352 12/12/22 1455  NA 136 135  K 3.8 3.8  CL 104 106  CO2 13*  --   GLUCOSE 136* 133*  BUN 9 8  CREATININE 1.11 1.30*  CALCIUM 8.0*  --    LFT Recent Labs    12/12/22 1352  PROT 6.7  ALBUMIN 3.2*  AST 163*  ALT 43  ALKPHOS 26*  BILITOT 0.8   PT/INR Recent Labs    12/12/22 1352  LABPROT 17.4*  INR 1.4*     Impression / Plan:   Impression: 1.  Hematochezia: Started today, hemoglobin found to be 4.7 at admission--> 1 unit PRBCs 5.8--> another unit PRBCs is about to be hung, no proceeding diarrhea or constipation, no real abdominal pain, CTA pending, last colonoscopy in 2019; consider diverticular bleed versus rectal varices versus other 2.  Acute blood loss anemia: With above 3.  Alcoholic cirrhosis with portal hypertension and thrombocytopenia in the setting of continued alcohol use 4.  Coagulopathy with INR  elevated at 1.4  Plan: 1.  Await results from CTA 2.  Continue to monitor hemoglobin with transfusion as needed less than 7 3.  Patient should be on a clear liquid diet for now 4.  Platelets are only 27.  No need for emergent colonoscopy at this point, we will wait on CTA as above.  Ideally platelets would be above 50 before procedures if needed. 5.  Okay with Protonix infusion at the moment, pending symptoms overnight could switch to IV or p.o. twice daily 6.  Again recommend  alcohol cessation  Thank you for your kind consultation, we will continue to follow.  Violet Baldy [facility]  12/12/2022, 3:37 PM

## 2022-12-12 NOTE — Progress Notes (Signed)
EEG complete - results pending 

## 2022-12-12 NOTE — ED Provider Notes (Signed)
Patient seen by prior provider please see his note for complete details.  Signout to me pending results of CT angiogram of abdomen for GI bleed.  Those results show no localizing etiology of GI bleeding at this time.  Patient has been seen by [facility].  Will admit to the medicine service   Lorre Nick, MD 12/12/22 [phone]

## 2022-12-12 NOTE — ED Triage Notes (Signed)
Pt BIB [facility] for a GI bleed.  EMS endorses at least on scene.  Lots of blood dried on leg.  EMS states that per wife, Pt drank ETOH last night, went to work, had a seizure at work, then came home and wife found him this AM bleeding and weak. PT has hx of hemmroids.  EMS discovered BP of 80/50 on scene.  Gabve of NS.  BP 110/70 on arrival

## 2022-12-12 NOTE — ED Provider Notes (Signed)
[place] EMERGENCY DEPARTMENT AT [facility] Provider Note   CSN: [id number] Arrival date & time: 12/12/22  1347     History  No chief complaint on file.   Stephen Bowers is a 62 y.o. male.  HPI   62 year old male with medical history significant for hypertension, hyperlipidemia, pancreatitis, alcohol use disorder, alcoholic fatty liver, history of GI bleeding from duodenal ulcer who presents to the emergency department with bright red blood per rectum.  The patient exsanguinated at least 200 cc of blood per rectum with EMS.  He stated that he developed bleeding this morning.  Initially he was refusing transport did not want to come to the emergency department for further evaluation.  The patient denies any hematemesis.  He drank alcohol last night and went to work and had a reported seizure at work and then came home and his wife found him this morning bleeding and profusely weak.  EMS found the patient to have a blood pressure of 80/50 on scene and he received 500 cc of normal saline with improvement in his blood pressure on arrival.  Home Medications Prior to Admission medications   Medication Sig Start Date End Date Taking? Authorizing Provider  amLODipine (NORVASC) 10 MG tablet TAKE 1 TABLET (10 MG TOTAL) BY MOUTH DAILY. Patient taking differently: Take 10 mg by mouth daily. 07/28/21 07/28/22  [name], MD  Ascorbic Acid (VITAMIN C PO) Take 1 tablet by mouth daily.    [provider]  cholecalciferol (VITAMIN D3) 25 MCG (1000 UNIT) tablet Take 1,000 Units by mouth daily.    [provider]  colchicine 0.6 MG tablet TAKE 1 TABLET BY MOUTH EVERY OTHER DAY ON MONDAY, WEDNESDAY, AND FRIDAY. Patient taking differently: Take 0.6 mg by mouth every other day. 05/07/20 05/22/22  [name], MD  ELDERBERRY PO Take 1 tablet by mouth daily.    [provider]  ferrous sulfate 325 (65 FE) MG tablet Take 1 tablet (325 mg total) by mouth 2  (two) times daily with a meal. 07/28/21 05/22/22  [name], MD  folic acid (FOLVITE) 1 MG tablet Take 1 tablet (1 mg total) by mouth daily. 07/08/21   Osvaldo Shipper, MD  Levocetirizine Dihydrochloride (XYZAL PO) Take 1 tablet by mouth daily as needed (allergies).    [provider]  Multiple Vitamin (MULTIVITAMIN WITH MINERALS) TABS tablet Take 1 tablet by mouth daily.    [provider]  Multiple Vitamins-Minerals (ZINC PO) Take 1 tablet by mouth daily.    [provider]  pantoprazole (PROTONIX) 40 MG tablet Take 1 tablet (40 mg total) by mouth 2 (two) times daily before a meal. 06/01/22   Elgergawy, Leana Roe, MD  Tetrahydrozoline HCl (VISINE OP) Place 1 drop into both eyes daily as needed (dry eyes).     [provider]  thiamine (VITAMIN B1) 100 MG tablet Take 1 tablet (100 mg total) by mouth daily. 06/01/22   Elgergawy, Leana Roe, MD  vitamin E 1000 UNIT capsule Take 1,000 Units by mouth daily.    [provider]      Allergies    Patient has no known allergies.    Review of Systems   Review of Systems  Unable to perform ROS: Acuity of condition    Physical Exam Updated Vital Signs BP 110/76   Pulse 99   Temp 97.8 F (36.6 C) (Oral)   Resp 19   SpO2 100%  Physical Exam Vitals and nursing  note reviewed. Exam conducted with a chaperone present.  Constitutional:      Appearance: He is well-developed.  HENT:     Head: Normocephalic and atraumatic.  Eyes:     Conjunctiva/sclera: Conjunctivae normal.  Cardiovascular:     Rate and Rhythm: Normal rate and regular rhythm.  Pulmonary:     Effort: Pulmonary effort is normal. No respiratory distress.     Breath sounds: Normal breath sounds.  Abdominal:     Palpations: Abdomen is soft.     Tenderness: There is no abdominal tenderness.  Genitourinary:    Comments: Bright red blood per rectum noted Musculoskeletal:        General: No swelling.     Cervical back: Neck supple.   Skin:    General: Skin is warm and dry.     Capillary Refill: Capillary refill takes less than 2 seconds.  Neurological:     Mental Status: He is alert.     GCS: GCS eye subscore is 4. GCS verbal subscore is 5. GCS motor subscore is 6.     Cranial Nerves: Cranial nerves 2-12 are intact.     Sensory: Sensation is intact.     Motor: Motor function is intact.     Coordination: Coordination is intact.  Psychiatric:        Mood and Affect: Mood normal.     ED Results / Procedures / Treatments   Labs (all labs ordered are listed, but only abnormal results are displayed) Labs Reviewed  COMPREHENSIVE METABOLIC PANEL - Abnormal; Notable for the following components:      Result Value   CO2 13 (*)    Glucose, Bld 136 (*)    Calcium 8.0 (*)    Albumin 3.2 (*)    AST 163 (*)    Alkaline Phosphatase 26 (*)    Anion gap 19 (*)    All other components within normal limits  CBC WITH DIFFERENTIAL/PLATELET - Abnormal; Notable for the following components:   RBC 1.72 (*)    Hemoglobin 4.7 (*)    HCT 15.3 (*)    RDW 22.4 (*)    Platelets 27 (*)    nRBC 0.3 (*)    All other components within normal limits  PROTIME-INR - Abnormal; Notable for the following components:   Prothrombin Time 17.4 (*)    INR 1.4 (*)    All other components within normal limits  LACTIC ACID, PLASMA - Abnormal; Notable for the following components:   Lactic Acid, Venous 7.2 (*)    All other components within normal limits  LIPASE, BLOOD - Abnormal; Notable for the following components:   Lipase 71 (*)    All other components within normal limits  MAGNESIUM - Abnormal; Notable for the following components:   Magnesium 1.5 (*)    All other components within normal limits  I-STAT CHEM 8, ED - Abnormal; Notable for the following components:   Creatinine, Ser 1.30 (*)    Glucose, Bld 133 (*)    Calcium, Ion 1.02 (*)    TCO2 15 (*)    Hemoglobin 5.8 (*)    HCT 17.0 (*)    All other components within normal  limits  TYPE AND SCREEN  PREPARE RBC (CROSSMATCH)  PREPARE PLATELET PHERESIS    EKG EKG Interpretation  Date/Time:  Tuesday December 12 2022 13:53:11 EDT Ventricular Rate:  81 PR Interval:  172 QRS Duration: 96 QT Interval:  433 QTC Calculation: 503 R Axis:   28 Text Interpretation:  Sinus rhythm Nonspecific ST abnormality No STEMI Confirmed by Ernie Avena ([id number]) on 12/12/2022 5:13:40 PM  Radiology No results found.  Procedures .Critical Care  Performed by: Ernie Avena, MD Authorized by: Ernie Avena, MD   Critical care provider statement:    Critical care time (minutes):  30   Critical care was necessary to treat or prevent imminent or life-threatening deterioration of the following conditions:  Circulatory failure   Critical care was time spent personally by me on the following activities:  Development of treatment plan with patient or surrogate, discussions with consultants, evaluation of patient's response to treatment, examination of patient, ordering and review of laboratory studies, ordering and review of radiographic studies, ordering and performing treatments and interventions, pulse oximetry, re-evaluation of patient's condition and review of old charts   Care discussed with: admitting provider       Medications Ordered in ED Medications  sodium chloride 0.9 % bolus 1,000 mL (0 mLs Intravenous Stopped 12/12/22 1500)    And  0.9 %  sodium chloride infusion ( Intravenous New Bag/Given 12/12/22 1502)  pantoprozole (PROTONIX) 80 mg /NS 100 mL infusion (8 mg/hr Intravenous New Bag/Given 12/12/22 1559)  0.9 %  sodium chloride infusion (Manually program via Guardrails IV Fluids) (has no administration in time range)  0.9 %  sodium chloride infusion (Manually program via Guardrails IV Fluids) (has no administration in time range)  magnesium sulfate IVPB 2 g 50 mL (has no administration in time range)  pantoprazole (PROTONIX) 80 mg /NS 100 mL IVPB (0 mg Intravenous Stopped  12/12/22 1625)  iohexol (OMNIPAQUE) 350 MG/ML injection 100 mL (100 mLs Intravenous Contrast Given 12/12/22 1653)    ED Course/ Medical Decision Making/ A&P Clinical Course as of 12/12/22 1719  Tue Dec 12, 2022  1500 Hemoglobin(!!): 5.8 [JL]  1702 Platelets(!!): 27 [JL]  1702 Hemoglobin(!!): 4.7 [JL]    Clinical Course User Index [JL] Ernie Avena, MD                             Medical Decision Making Amount and/or Complexity of Data Reviewed Labs: ordered. Decision-making details documented in ED Course. Radiology: ordered.  Risk Prescription drug management.    62 year old male with medical history significant for hypertension, hyperlipidemia, pancreatitis, alcohol use disorder, alcoholic fatty liver, history of GI bleeding from duodenal ulcer who presents to the emergency department with bright red blood per rectum.  The patient exsanguinated at least 200 cc of blood per rectum with EMS.  He stated that he developed bleeding this morning.  Initially he was refusing transport did not want to come to the emergency department for further evaluation.  The patient denies any hematemesis.  He drank alcohol last night and went to work and had a reported seizure at work and then came home and his wife found him this morning bleeding and profusely weak.  EMS found the patient to have a blood pressure of 80/50 on scene and he received 500 cc of normal saline with improvement in his blood pressure on arrival.   On arrival, the patient was vitally stable, afebrile, not tachycardic, BP soft 99/63 status post 500 cc of IV fluids with EMS, saturating 100% on room air.  Bright red blood per rectum noted on physical exam.  Sinus rhythm noted on cardiac telemetry.  Concern for lower GI bleed.  No hematemesis to suggest upper GI bleed in the setting of the patient's recent alcohol use.  His  last EGD was 1 year ago which showed no evidence disease of esophageal varices did show evidence of a duodenal  ulcer.  Given the patient's bright red blood, suspect lower GI bleed.  2 large-bore IVs were established and the patient was further fluid resuscitated with 1 L of NaCl and the patient was started on a Protonix bolus and infusion.  His initial CBC was found to be significantly anemic with a hemoglobin of 4.7 and thrombocytopenic with platelet count of 27.  His CMP revealed mild hyperglycemia, and an anion gap acidosis with a bicarbonate of 13, anion gap of 19, lactic acid was elevated to 7.2, magnesium was low at 1.5, lipase was mildly elevated at 71.  The patient was consented for blood transfusion and 2 units of PRBCs were ordered.  Dr. Barron Alvine of on-call [facility] gastroenterology was consulted and agreed with the above plan and additionally requested a CT angio GI bleed to be performed to evaluate for possible active extravasation.  The patient's blood pressure improved following fluid resuscitation and blood product administration.  Signout given to Dr. Freida Busman to reassess the patient, ultimate disposition pending reassessment, disposition either progressive care versus ICU, follow-up CT angiogram results.  The patient's wife was updated over the phone regarding the plan of care.    Final Clinical Impression(s) / ED Diagnoses Final diagnoses:  Lower GI bleed  Anemia, unspecified type  Alcohol use  Witnessed seizure-like activity [facility])    Rx / DC Orders ED Discharge Orders     None         Ernie Avena, MD 12/12/22 1719

## 2022-12-12 NOTE — ED Notes (Signed)
Janine Limbo (Spouse) called asking for a update for [place]. Her number is [phone].

## 2022-12-13 DIAGNOSIS — R569 Unspecified convulsions: Secondary | ICD-10-CM

## 2022-12-13 DIAGNOSIS — E872 Acidosis, unspecified: Secondary | ICD-10-CM

## 2022-12-13 DIAGNOSIS — K921 Melena: Secondary | ICD-10-CM

## 2022-12-13 DIAGNOSIS — Z789 Other specified health status: Secondary | ICD-10-CM

## 2022-12-13 DIAGNOSIS — F10931 Alcohol use, unspecified with withdrawal delirium: Secondary | ICD-10-CM

## 2022-12-13 LAB — MAGNESIUM: Magnesium: 1.7 mg/dL (ref 1.7–2.4)

## 2022-12-13 LAB — BPAM PLATELET PHERESIS
ISSUE DATE / TIME: 202406111852
Unit Type and Rh: 6200

## 2022-12-13 LAB — PREPARE PLATELET PHERESIS: Unit division: 0

## 2022-12-13 LAB — PROTIME-INR
INR: 1.4 — ABNORMAL HIGH (ref 0.8–1.2)
Prothrombin Time: 17.8 seconds — ABNORMAL HIGH (ref 11.4–15.2)

## 2022-12-13 LAB — CBC WITH DIFFERENTIAL/PLATELET
Abs Immature Granulocytes: 0.04 10*3/uL (ref 0.00–0.07)
Basophils Absolute: 0.1 10*3/uL (ref 0.0–0.1)
Basophils Relative: 1 %
Eosinophils Absolute: 0.1 10*3/uL (ref 0.0–0.5)
Eosinophils Relative: 2 %
HCT: 18 % — ABNORMAL LOW (ref 39.0–52.0)
Hemoglobin: 6 g/dL — CL (ref 13.0–17.0)
Immature Granulocytes: 1 %
Lymphocytes Relative: 17 %
Lymphs Abs: 1.3 10*3/uL (ref 0.7–4.0)
MCH: 27.8 pg (ref 26.0–34.0)
MCHC: 33.3 g/dL (ref 30.0–36.0)
MCV: 83.3 fL (ref 80.0–100.0)
Monocytes Absolute: 0.4 10*3/uL (ref 0.1–1.0)
Monocytes Relative: 6 %
Neutro Abs: 5.7 10*3/uL (ref 1.7–7.7)
Neutrophils Relative %: 73 %
Platelets: 38 10*3/uL — ABNORMAL LOW (ref 150–400)
RBC: 2.16 MIL/uL — ABNORMAL LOW (ref 4.22–5.81)
RDW: 18.7 % — ABNORMAL HIGH (ref 11.5–15.5)
WBC: 7.7 10*3/uL (ref 4.0–10.5)
nRBC: 0.3 % — ABNORMAL HIGH (ref 0.0–0.2)

## 2022-12-13 LAB — COMPREHENSIVE METABOLIC PANEL
ALT: 38 U/L (ref 0–44)
AST: 133 U/L — ABNORMAL HIGH (ref 15–41)
Albumin: 3 g/dL — ABNORMAL LOW (ref 3.5–5.0)
Alkaline Phosphatase: 27 U/L — ABNORMAL LOW (ref 38–126)
Anion gap: 14 (ref 5–15)
BUN: 6 mg/dL (ref 6–20)
CO2: 16 mmol/L — ABNORMAL LOW (ref 22–32)
Calcium: 7.6 mg/dL — ABNORMAL LOW (ref 8.9–10.3)
Chloride: 105 mmol/L (ref 98–111)
Creatinine, Ser: 0.95 mg/dL (ref 0.61–1.24)
GFR, Estimated: >60 mL/min (ref >60)
Glucose, Bld: 94 mg/dL (ref 70–99)
Potassium: 3.6 mmol/L (ref 3.5–5.1)
Sodium: 135 mmol/L (ref 135–145)
Total Bilirubin: 1.9 mg/dL — ABNORMAL HIGH (ref 0.3–1.2)
Total Protein: 6.1 g/dL — ABNORMAL LOW (ref 6.5–8.1)

## 2022-12-13 LAB — TECHNOLOGIST SMEAR REVIEW: Plt Morphology: DECREASED

## 2022-12-13 LAB — HIV ANTIBODY (ROUTINE TESTING W REFLEX): HIV Screen 4th Generation wRfx: NONREACTIVE

## 2022-12-13 LAB — HEMOGLOBIN AND HEMATOCRIT, BLOOD
HCT: 26.9 % — ABNORMAL LOW (ref 39.0–52.0)
Hemoglobin: 9.2 g/dL — ABNORMAL LOW (ref 13.0–17.0)

## 2022-12-13 LAB — PREPARE RBC (CROSSMATCH)

## 2022-12-13 LAB — IMMATURE PLATELET FRACTION: Immature Platelet Fraction: 1.6 % (ref 1.2–8.6)

## 2022-12-13 MED ORDER — SODIUM CHLORIDE 0.9% IV SOLUTION: Freq: Once | INTRAVENOUS | Status: AC

## 2022-12-13 MED ORDER — FUROSEMIDE 10 MG/ML IJ SOLN
20.0000 mg | Freq: Once | INTRAMUSCULAR | Status: AC
  Administered 2022-12-13: 20 mg via INTRAVENOUS

## 2022-12-13 MED ORDER — THIAMINE MONONITRATE 100 MG PO TABS
100.0000 mg | ORAL_TABLET | Freq: Every day | ORAL | Status: DC
  Administered 2022-12-16 – 2022-12-23 (×8): 100 mg via ORAL
  Filled 2022-12-13 (×8): qty 1

## 2022-12-13 MED ORDER — THIAMINE HCL 100 MG/ML IJ SOLN
500.0000 mg | INTRAVENOUS | Status: DC
  Administered 2022-12-13 – 2022-12-18 (×6): 500 mg via INTRAVENOUS
  Filled 2022-12-13 (×7): qty 5

## 2022-12-13 MED ORDER — FUROSEMIDE 10 MG/ML IJ SOLN
20.0000 mg | Freq: Once | INTRAMUSCULAR | Status: DC
  Filled 2022-12-13: qty 2

## 2022-12-13 MED ORDER — THIAMINE HCL 100 MG/ML IJ SOLN
100.0000 mg | Freq: Every day | INTRAMUSCULAR | Status: DC
  Filled 2022-12-13: qty 2

## 2022-12-13 NOTE — Plan of Care (Signed)

## 2022-12-13 NOTE — Procedures (Signed)
Patient Name: Stephen Bowers  MRN: [id number]  Epilepsy Attending: Charlsie Quest  Referring Physician/Provider: Ernie Avena, MD  Date: 12/12/2022 Duration: 27.22 mins  Patient history: 62yo M patient reportedly had a seizure episode while at work prior to presentation. Concern is for alcohol withdrawal seizure. EEG to evaluate for seizure  Level of alertness: Awake  AEDs during EEG study: None  Technical aspects: This EEG study was done with scalp electrodes positioned according to the 10-20 International system of electrode placement. Electrical activity was reviewed with band pass filter of 1-70Hz , sensitivity of 7 uV/mm, display speed of 58mm/sec with a 60Hz  notched filter applied as appropriate. EEG data were recorded continuously and digitally stored.  Video monitoring was available and reviewed as appropriate.  Description: The posterior dominant rhythm consists of 8-9 Hz activity of moderate voltage (25-35 uV) seen predominantly in posterior head regions, symmetric and reactive to eye opening and eye closing. Physiologic photic driving was not seen during photic stimulation.  Hyperventilation was performed.     IMPRESSION: This study is within normal limits. No seizures or epileptiform discharges were seen throughout the recording.  A normal interictal EEG does not exclude the diagnosis of epilepsy.  Pia Jedlicka Annabelle Harman

## 2022-12-13 NOTE — Progress Notes (Addendum)
Attending physician's note   I have taken a history, reviewed the chart, and examined the patient. I performed a substantive portion of this encounter, including complete performance of at least one of the key components, in conjunction with the APP. I agree with the APP's note, impression, and recommendations with my edits.   CTA with wall thickening in the distal esophagus, but otherwise largely unremarkable and no extravasation/bleeding.  No areas of inflammation, ischemia.  Patent vasculature.  No acute events overnight.  No further bleeding.  Received 2 unit PRBCs overnight, but H/H still 6/18 this morning.  Receiving another unit of blood now.  PLT 38 and ordered for platelet transfusion.  1) Hematochezia 2) Acute blood loss anemia No clear source on CTA.  Based on CT findings, not a diverticular bleed, ischemic colitis, IBD.  Could have been a brisk hemorrhoidal bleed, or possibly AVM disease.  Briefly discussed possibility of malignant bleeding.  Certainly would benefit from colonoscopy, but discussed whether or not this can be done as an outpatient instead given cessation of bleeding and overall HD stability. - If no further bleeding, H/H uptrending, and hemodynamically stable, can likely plan for colonoscopy to be done as an outpatient - If recurrence of bleeding, H/H not improving, etc., will plan on colonoscopy as inpatient - Continue with clears for the time being  3) EtOH cirrhosis 4) Coagulopathy No varices or portal hypertensive gastropathy on EGD in 07/2021. - Needs to quit EtOH completely - CIWA protocol - Trend daily liver enzymes.  AST improving.  T. bili did bump to 1.9.  If again elevated tomorrow, get bilirubin fractionization  5) Thrombocytopenia PLT 27 on admission.  Was 583, but that was during hospital admission as well.  No outpatient labs for comparison, so hard to gauge his true baseline. - PLT transfusion - Workup for alternate etiology for  thrombocytopenia (outside of splenic sequestration 2/2 cirrhosis) per primary Hospital service.  If evaluation unrevealing, may benefit from Hematology consult  6) Lactic acidosis - Improving  7) AKI - Resolved.  Renal function back to baseline  GI service will continue to follow.  Tome Wilson, DO, FACG [phone] office          Progress Note   Subjective  Day #2 Chief Complaint: Hematochezia, acute blood loss anemia, history of cirrhosis  Today, patient tells me he has not had any further bleeding overnight, he tells me he is getting another unit or 2 of blood.  No new abdominal pain or other change in GI symptoms.   Objective   Vital signs in last 24 hours: Temp:  [97.5 F (36.4 C)-98.8 F (37.1 C)] 98.4 F (36.9 C) (06/12 1130) Pulse Rate:  [68-99] 68 (06/12 1130) Resp:  [8-27] 19 (06/12 1130) BP: (92-155)/(63-92) 142/87 (06/12 1130) SpO2:  [95 %-100 %] 100 % (06/12 1130) Weight:  [85.1 kg] 85.1 kg (06/11 2339)   General:    AA male in NAD Heart:  Regular rate and rhythm; no murmurs Lungs: Respirations even and unlabored, lungs CTA bilaterally Abdomen:  Soft, nontender and nondistended. Normal bowel sounds. Psych:  Cooperative. Normal mood and affect.  Intake/Output from previous day: 06/11 0701 - 06/12 0700 In: 3288.5 [I.V.:1567.7; Blood:720.8; IV Piggyback:1000] Out: 800 [Urine:800] Intake/Output this shift: Total I/O In: 315 [Blood:315] Out: 900 [Urine:900]  Lab Results: Recent Labs    12/12/22 1352 12/12/22 1455 12/13/22 0603  WBC 6.0  --  7.7  HGB 4.7* 5.8* 6.0*  HCT 15.3* 17.0*  18.0*  PLT 27*  --  38*   BMET Recent Labs    12/12/22 1352 12/12/22 1455 12/13/22 0603  NA 136 135 135  K 3.8 3.8 3.6  CL 104 106 105  CO2 13*  --  16*  GLUCOSE 136* 133* 94  BUN 9 8 6   CREATININE 1.11 1.30* 0.95  CALCIUM 8.0*  --  7.6*   LFT Recent Labs    12/13/22 0603  PROT 6.1*  ALBUMIN 3.0*  AST 133*  ALT 38  ALKPHOS 27*  BILITOT  1.9*   PT/INR Recent Labs    12/12/22 1352 12/13/22 0603  LABPROT 17.4* 17.8*  INR 1.4* 1.4*    Studies/Results: EEG adult  Result Date: 12/13/2022 Charlsie Quest, MD     12/13/2022  8:31 AM Patient Name: NICANOR CAPPELLETTI MRN: [id number] Epilepsy Attending: Charlsie Quest Referring Physician/Provider: Ernie Avena, MD Date: 12/12/2022 Duration: 27.22 mins Patient history: 62yo M patient reportedly had a seizure episode while at work prior to presentation. Concern is for alcohol withdrawal seizure. EEG to evaluate for seizure Level of alertness: Awake AEDs during EEG study: None Technical aspects: This EEG study was done with scalp electrodes positioned according to the 10-20 International system of electrode placement. Electrical activity was reviewed with band pass filter of 1-70Hz , sensitivity of 7 uV/mm, display speed of 41mm/sec with a 60Hz  notched filter applied as appropriate. EEG data were recorded continuously and digitally stored.  Video monitoring was available and reviewed as appropriate. Description: The posterior dominant rhythm consists of 8-9 Hz activity of moderate voltage (25-35 uV) seen predominantly in posterior head regions, symmetric and reactive to eye opening and eye closing. Physiologic photic driving was not seen during photic stimulation.  Hyperventilation was performed.   IMPRESSION: This study is within normal limits. No seizures or epileptiform discharges were seen throughout the recording. A normal interictal EEG does not exclude the diagnosis of epilepsy. Priyanka Annabelle Harman   CT ANGIO GI BLEED  Result Date: 12/12/2022 CLINICAL DATA:  Acute mesenteric ischemia. GI bleed. EXAM: CTA ABDOMEN AND PELVIS WITHOUT AND WITH CONTRAST TECHNIQUE: Multidetector CT imaging of the abdomen and pelvis was performed using the standard protocol during bolus administration of intravenous contrast. Multiplanar reconstructed images and MIPs were obtained and reviewed to evaluate the  vascular anatomy. RADIATION DOSE REDUCTION: This exam was performed according to the departmental dose-optimization program which includes automated exposure control, adjustment of the mA and/or kV according to patient size and/or use of iterative reconstruction technique. CONTRAST:  OMNIPAQUE IOHEXOL 350 MG/ML SOLN COMPARISON:  Abdominopelvic CT 05/22/2022 FINDINGS: VASCULAR Aorta: Moderately calcified. Normal caliber aorta without aneurysm, dissection, vasculitis or significant stenosis. Celiac: Patent without evidence of aneurysm, dissection, or vasculitis. Mild stenosis at the origin due to the diaphragmatic crus. SMA: Patent without evidence of aneurysm, dissection, vasculitis or significant stenosis. Renals: Single right and 2 left renal arteries. All renal arteries are patent. Mild atherosclerosis. No dissection, aneurysm or vasculitis. IMA: Patent without evidence of aneurysm, dissection, vasculitis or significant stenosis. Inflow: Patent without evidence of aneurysm, dissection, vasculitis or significant stenosis. Proximal Outflow: Bilateral common femoral and visualized portions of the superficial and profunda femoral arteries are patent without evidence of aneurysm, dissection, vasculitis or significant stenosis. Veins: Venous phase imaging demonstrates patency of the iliac veins and IVC. No thrombus within the mesenteric or portal veins. The splenic vein is patent. Review of the MIP images confirms the above findings. NON-VASCULAR Lower chest: No basilar airspace disease or pleural  effusion. Normal heart size with coronary artery calcifications. Hepatobiliary: Advanced hepatic steatosis. Focal fatty sparing adjacent to the gallbladder fossa. No focal liver abnormality Gallbladder physiologically distended, no calcified stone. No biliary dilatation. Pancreas: No ductal dilatation or inflammation. Spleen: No splenomegaly. No focal splenic abnormality. Adrenals/Urinary Tract: No adrenal nodule. There  is chronic bilateral perinephric edema. No hydronephrosis. Subcentimeter cyst from the posterior left kidney a punctate calcification and is unchanged from prior exam. No specific imaging follow-up is needed. Unremarkable urinary bladder. Stomach/Bowel: There is no contrast accumulating in the GI tract to localize site of GI bleed. No bowel wall thickening or pneumatosis to suggest mesenteric ischemia. No bowel inflammation or obstruction. Again seen wall thickening of the distal esophagus. The appendix is normal. Lymphatic: No adenopathy. Reproductive: Prostate is unremarkable. Other: No ascites. Mild patchy soft tissue edema in the left flank. Bilateral perinephric and retroperitoneal stranding is chronic and unchanged. Fat in both inguinal canals. Small subcutaneous lipoma overlies the right hip, chronic. Musculoskeletal: There are no acute or suspicious osseous abnormalities. IMPRESSION: 1. No contrast accumulating in the GI tract to localize site of GI bleed. No evidence of bowel inflammation, pneumatosis, or bowel wall thickening to suggest mesenteric ischemia. 2. Wall thickening about the distal esophagus can be seen with reflux or esophagitis. 3. Chronic bilateral perinephric stranding. 4. Aortic atherosclerosis.  Coronary artery calcifications. 5. Advanced hepatic steatosis. Aortic Atherosclerosis (ICD10-I70.0). Electronically Signed   By: Narda Rutherford M.D.   On: 12/12/2022 18:05       Assessment / Plan:   Assessment: 1.  Hematochezia: No further bleeding overnight per the patient, hemoglobin still low and awaiting 2 further units of PRBCs, currently at 6; again differential diagnosis includes brisk hemorrhoidal bleeding, diverticular bleed, rectal varices or ischemic colitis/IBD which are less likely 2.  Acute blood loss anemia: With above 3.  Alcoholic cirrhosis with portal hypertension and thrombocytopenia in the setting of continued alcohol use: MELD 3.0 was 11 MDF 21 4.  Coagulopathy with  INR elevated at 1.4  Plan: 1.  Unfortunately CTA did not localize a site of GI bleeding, did show some wall thickening in the distal esophagus and otherwise normal.  Patient has had no further bleeding overnight.  Hemoglobin up to 6 this morning with 2 further units of PRBCs ordered. 2.  Continue to monitor hemoglobin and transfusion as needed less than 7 3.  Okay for clear liquid diet for now.  If further signs of bleeding will need to be made NPO. 4.  Continue Protonix infusion 5.  Will need to monitor for further GI bleeding, could consider colonoscopy if needed/bleeding persists  Thank you for kind consultation, we will continue to follow    LOS: 1 day   Unk Lightning  12/13/2022, 11:51 AM

## 2022-12-13 NOTE — Progress Notes (Signed)
PROGRESS NOTE    Stephen Bowers  ZOX:[id number] DOB: 1961/04/19 DOA: 12/12/2022 PCP: Stephen Matar, MD    No chief complaint on file.   Brief Narrative:   Stephen Bowers is a 62 y.o. male with medical history significant for alcohol use disorder, alcohol associated fatty liver, history of GI bleed from duodenal ulcer, HTN, HLD, pancreatitis who presented to the ED for evaluation of hematochezia and seizure. - He reportedly went to work and had a seizure at work.  He returned home and his wife found him bleeding very weak.  EMS were again called and BP was 80/50 on scene.    Labs show hemoglobin 4.7, platelets 27,000, WBC 6.0, sodium 136, potassium 3.8, bicarb 13, BUN 9, creatinine 1.11, serum glucose 136, AST 163, ALT 43, alk phos 26, total bilirubin 0.8, INR 1.4, lipase 71, magnesium 1.5, lactic acid 7.2.  - CT angio GI bleed study did not show localized GI bleed.  Distal esophagus wall thickening noted.  Chronic bilateral perinephric stranding also seen.  Advanced hepatic steatosis reported.  - started on IV Protonix infusion.  Patient was ordered to receive 2 unit PRBC transfusion and 1 unit platelets.  GI consulted, EEG is negative.  Assessment & Plan:   Principal Problem:   Acute blood loss anemia Active Problems:   Alcohol withdrawal seizure (HCC)   Thrombocytopenia (HCC)   Symptomatic anemia   Hematochezia   ABLA (acute blood loss anemia)   Lactic acidosis   Acute blood loss anemia due to GI bleeding: Presenting with hematochezia.  Hemoglobin 4.7 and platelets 27.  Patient for lower GI bleed, CTA unrevealing. -Denies any NSAIDs use or any blood thinners use. -Transfused 2 units PRBC overnight, hemoglobin remains significantly low at 6, will give another 3 units. -Units of platelets in the setting of active GI bleed. -He remains n.p.o. for now, will await further recommendation from GI, continue to monitor CBC closely.   Thrombocytopenia: Platelets 27,000 on  admission, previously 583,000 six months ago.  It is most likely due to alcohol abuse . -Platelet count is 38 K today, but given still active GI bleed will transfuse another unit of platelets . -Will check peripheral smear, immature platelet lab .   Seizure: Patient reportedly had a seizure episode while at work prior to presentation.  Concern is for alcohol withdrawal seizure.  -EEG with no evidence of active seizures   Alcohol use disorder: As above, concern for alcohol withdrawal with seizure prior to presentation.  Patient reports last drink was 6/9. -Started on CIWA protocol with Ativan as needed -Continue thiamine, folate, MVM, will keep on high-dose thiamine for now   Lactic acidosis: Lactic acid 7.5.  He has not been hypotensive.  Suspect this is secondary to seizure activity. . As well due to poor perfusion from anemia c -Continue to trend lactic acid .   Alcohol associated cirrhosis: No evidence of decompensated cirrhosis or hepatic encephalopathy.  GI following.   DVT prophylaxis: SCD Code Status: Full Family Communication: none at bedisde Disposition:   Status is: Inpatient    Consultants:  GI  Subjective:  No significant overnight, he denies any bowel movement since admission, no nausea, no vomiting, no abdominal pain.  Objective: Vitals:   12/13/22 0350 12/13/22 0800 12/13/22 0900 12/13/22 0915  BP: (!) 143/92 132/86 132/86 (!) 141/84  Pulse: 87 77 77 74  Resp: 17 14 14 13   Temp: 98.4 F (36.9 C) 98.8 F (37.1 C) 98.8 F (37.1 C) 98.7  F (37.1 C)  TempSrc: Oral Oral Oral Oral  SpO2: 99% 99%  98%  Weight:      Height:        Intake/Output Summary (Last 24 hours) at Dec 14, 2022 0959 Last data filed at 2022/12/14 0838 Gross per 24 hour  Intake 3288.48 ml  Output 1275 ml  Net 2013.48 ml   Filed Weights   12/12/22 2339  Weight: 85.1 kg    Examination:  General exam: Appears calm and comfortable  Respiratory system: Clear to auscultation.  Respiratory effort normal. Cardiovascular system: S1 & S2 heard, RRR. No JVD, murmurs, rubs, gallops or clicks. No pedal edema. Gastrointestinal system: Abdomen is nondistended, soft and nontender. No organomegaly or masses felt. Normal bowel sounds heard. Central nervous system: Alert and oriented. No focal neurological deficits. Extremities: Symmetric 5 x 5 power. Skin: No rashes, lesions or ulcers Psychiatry: Judgement and insight appear normal. Mood & affect appropriate.     Data Reviewed: I have personally reviewed following labs and imaging studies  CBC: Recent Labs  Lab 12/12/22 1352 12/12/22 1455 14-Dec-2022 0603  WBC 6.0  --  7.7  NEUTROABS 4.3  --  5.7  HGB 4.7* 5.8* 6.0*  HCT 15.3* 17.0* 18.0*  MCV 89.0  --  83.3  PLT 27*  --  38*    Basic Metabolic Panel: Recent Labs  Lab 12/12/22 1352 12/12/22 1455 2022-12-14 0603  NA 136 135 135  K 3.8 3.8 3.6  CL 104 106 105  CO2 13*  --  16*  GLUCOSE 136* 133* 94  BUN 9 8 6   CREATININE 1.11 1.30* 0.95  CALCIUM 8.0*  --  7.6*  MG 1.5*  --  1.7    GFR: Estimated Creatinine Clearance: 88.1 mL/min (by C-G formula based on SCr of 0.95 mg/dL).  Liver Function Tests: Recent Labs  Lab 12/12/22 1352 2022-12-14 0603  AST 163* 133*  ALT 43 38  ALKPHOS 26* 27*  BILITOT 0.8 1.9*  PROT 6.7 6.1*  ALBUMIN 3.2* 3.0*    CBG: No results for input(s): "GLUCAP" in the last 168 hours.   No results found for this or any previous visit (from the past 240 hour(s)).       Radiology Studies: EEG adult  Result Date: 14-Dec-2022 Charlsie Quest, MD     14-Dec-2022  8:31 AM Patient Name: Stephen Bowers MRN: [id number] Epilepsy Attending: Charlsie Quest Referring Physician/Provider: Ernie Avena, MD Date: 12/12/2022 Duration: 27.22 mins Patient history: 62yo M patient reportedly had a seizure episode while at work prior to presentation. Concern is for alcohol withdrawal seizure. EEG to evaluate for seizure Level of alertness:  Awake AEDs during EEG study: None Technical aspects: This EEG study was done with scalp electrodes positioned according to the 10-20 International system of electrode placement. Electrical activity was reviewed with band pass filter of 1-70Hz , sensitivity of 7 uV/mm, display speed of 66mm/sec with a 60Hz  notched filter applied as appropriate. EEG data were recorded continuously and digitally stored.  Video monitoring was available and reviewed as appropriate. Description: The posterior dominant rhythm consists of 8-9 Hz activity of moderate voltage (25-35 uV) seen predominantly in posterior head regions, symmetric and reactive to eye opening and eye closing. Physiologic photic driving was not seen during photic stimulation.  Hyperventilation was performed.   IMPRESSION: This study is within normal limits. No seizures or epileptiform discharges were seen throughout the recording. A normal interictal EEG does not exclude the diagnosis of epilepsy. Priyanka Annabelle Harman  CT ANGIO GI BLEED  Result Date: 12/12/2022 CLINICAL DATA:  Acute mesenteric ischemia. GI bleed. EXAM: CTA ABDOMEN AND PELVIS WITHOUT AND WITH CONTRAST TECHNIQUE: Multidetector CT imaging of the abdomen and pelvis was performed using the standard protocol during bolus administration of intravenous contrast. Multiplanar reconstructed images and MIPs were obtained and reviewed to evaluate the vascular anatomy. RADIATION DOSE REDUCTION: This exam was performed according to the departmental dose-optimization program which includes automated exposure control, adjustment of the mA and/or kV according to patient size and/or use of iterative reconstruction technique. CONTRAST:  OMNIPAQUE IOHEXOL 350 MG/ML SOLN COMPARISON:  Abdominopelvic CT 05/22/2022 FINDINGS: VASCULAR Aorta: Moderately calcified. Normal caliber aorta without aneurysm, dissection, vasculitis or significant stenosis. Celiac: Patent without evidence of aneurysm, dissection, or vasculitis.  Mild stenosis at the origin due to the diaphragmatic crus. SMA: Patent without evidence of aneurysm, dissection, vasculitis or significant stenosis. Renals: Single right and 2 left renal arteries. All renal arteries are patent. Mild atherosclerosis. No dissection, aneurysm or vasculitis. IMA: Patent without evidence of aneurysm, dissection, vasculitis or significant stenosis. Inflow: Patent without evidence of aneurysm, dissection, vasculitis or significant stenosis. Proximal Outflow: Bilateral common femoral and visualized portions of the superficial and profunda femoral arteries are patent without evidence of aneurysm, dissection, vasculitis or significant stenosis. Veins: Venous phase imaging demonstrates patency of the iliac veins and IVC. No thrombus within the mesenteric or portal veins. The splenic vein is patent. Review of the MIP images confirms the above findings. NON-VASCULAR Lower chest: No basilar airspace disease or pleural effusion. Normal heart size with coronary artery calcifications. Hepatobiliary: Advanced hepatic steatosis. Focal fatty sparing adjacent to the gallbladder fossa. No focal liver abnormality Gallbladder physiologically distended, no calcified stone. No biliary dilatation. Pancreas: No ductal dilatation or inflammation. Spleen: No splenomegaly. No focal splenic abnormality. Adrenals/Urinary Tract: No adrenal nodule. There is chronic bilateral perinephric edema. No hydronephrosis. Subcentimeter cyst from the posterior left kidney a punctate calcification and is unchanged from prior exam. No specific imaging follow-up is needed. Unremarkable urinary bladder. Stomach/Bowel: There is no contrast accumulating in the GI tract to localize site of GI bleed. No bowel wall thickening or pneumatosis to suggest mesenteric ischemia. No bowel inflammation or obstruction. Again seen wall thickening of the distal esophagus. The appendix is normal. Lymphatic: No adenopathy. Reproductive: Prostate is  unremarkable. Other: No ascites. Mild patchy soft tissue edema in the left flank. Bilateral perinephric and retroperitoneal stranding is chronic and unchanged. Fat in both inguinal canals. Small subcutaneous lipoma overlies the right hip, chronic. Musculoskeletal: There are no acute or suspicious osseous abnormalities. IMPRESSION: 1. No contrast accumulating in the GI tract to localize site of GI bleed. No evidence of bowel inflammation, pneumatosis, or bowel wall thickening to suggest mesenteric ischemia. 2. Wall thickening about the distal esophagus can be seen with reflux or esophagitis. 3. Chronic bilateral perinephric stranding. 4. Aortic atherosclerosis.  Coronary artery calcifications. 5. Advanced hepatic steatosis. Aortic Atherosclerosis (ICD10-I70.0). Electronically Signed   By: Narda Rutherford M.D.   On: 12/12/2022 18:05        Scheduled Meds:  sodium chloride   Intravenous Once   sodium chloride   Intravenous Once   sodium chloride   Intravenous Once   folic acid  1 mg Oral Daily   furosemide  20 mg Intravenous Once   furosemide  20 mg Intravenous Once   multivitamin with minerals  1 tablet Oral Daily   sodium chloride flush  3 mL Intravenous Q12H   [START ON 12/16/2022]  thiamine  100 mg Oral Daily   Or   [START ON 12/16/2022] thiamine  100 mg Intravenous Daily   Continuous Infusions:  sodium chloride 125 mL/hr at 12/13/22 0555   pantoprazole 8 mg/hr (12/13/22 0555)   thiamine (VITAMIN B1) injection       LOS: 1 day        Huey Bienenstock, MD [facility]   To contact the attending provider between 7A-7P or the covering provider during after hours 7P-7A, please log into the web site [email or web address] and access using universal [place] password for that web site. If you do not have the password, please call the hospital operator.  12/13/2022, 9:59 AM

## 2022-12-14 DIAGNOSIS — Z789 Other specified health status: Secondary | ICD-10-CM

## 2022-12-14 LAB — COMPREHENSIVE METABOLIC PANEL
ALT: 37 U/L (ref 0–44)
AST: 110 U/L — ABNORMAL HIGH (ref 15–41)
Albumin: 3.2 g/dL — ABNORMAL LOW (ref 3.5–5.0)
Alkaline Phosphatase: 31 U/L — ABNORMAL LOW (ref 38–126)
Anion gap: 13 (ref 5–15)
BUN: 6 mg/dL (ref 6–20)
CO2: 21 mmol/L — ABNORMAL LOW (ref 22–32)
Calcium: 8.1 mg/dL — ABNORMAL LOW (ref 8.9–10.3)
Chloride: 101 mmol/L (ref 98–111)
Creatinine, Ser: 0.8 mg/dL (ref 0.61–1.24)
GFR, Estimated: >60 mL/min (ref >60)
Glucose, Bld: 115 mg/dL — ABNORMAL HIGH (ref 70–99)
Potassium: 3 mmol/L — ABNORMAL LOW (ref 3.5–5.1)
Sodium: 135 mmol/L (ref 135–145)
Total Bilirubin: 2 mg/dL — ABNORMAL HIGH (ref 0.3–1.2)
Total Protein: 6.8 g/dL (ref 6.5–8.1)

## 2022-12-14 LAB — TYPE AND SCREEN
ABO/RH(D): A POS
Unit division: 0
Unit division: 0
Unit division: 0
Unit division: 0

## 2022-12-14 LAB — HEMOGLOBIN AND HEMATOCRIT, BLOOD
HCT: 29.3 % — ABNORMAL LOW (ref 39.0–52.0)
HCT: 29.3 % — ABNORMAL LOW (ref 39.0–52.0)
Hemoglobin: 10.1 g/dL — ABNORMAL LOW (ref 13.0–17.0)
Hemoglobin: 10.1 g/dL — ABNORMAL LOW (ref 13.0–17.0)

## 2022-12-14 LAB — BPAM PLATELET PHERESIS
Blood Product Expiration Date: 202406142359
ISSUE DATE / TIME: 202406121755
Unit Type and Rh: 6200

## 2022-12-14 LAB — BPAM RBC
Blood Product Expiration Date: 202406282359
Blood Product Expiration Date: 202406292359
Blood Product Expiration Date: 202407032359
Blood Product Expiration Date: 202407032359
Blood Product Expiration Date: 202407032359
ISSUE DATE / TIME: 202406111558
ISSUE DATE / TIME: 202406120852
ISSUE DATE / TIME: 202406121141
ISSUE DATE / TIME: 202406121428
Unit Type and Rh: 6200
Unit Type and Rh: 6200
Unit Type and Rh: 6200

## 2022-12-14 LAB — CBC WITH DIFFERENTIAL/PLATELET
Abs Immature Granulocytes: 0.05 10*3/uL (ref 0.00–0.07)
Basophils Absolute: 0 10*3/uL (ref 0.0–0.1)
Basophils Relative: 1 %
Eosinophils Absolute: 0.2 10*3/uL (ref 0.0–0.5)
Eosinophils Relative: 2 %
HCT: 28.1 % — ABNORMAL LOW (ref 39.0–52.0)
Hemoglobin: 9.6 g/dL — ABNORMAL LOW (ref 13.0–17.0)
Immature Granulocytes: 1 %
Lymphocytes Relative: 12 %
Lymphs Abs: 1 10*3/uL (ref 0.7–4.0)
MCH: 27.7 pg (ref 26.0–34.0)
MCHC: 34.2 g/dL (ref 30.0–36.0)
MCV: 81 fL (ref 80.0–100.0)
Monocytes Absolute: 0.7 10*3/uL (ref 0.1–1.0)
Monocytes Relative: 8 %
Neutro Abs: 6.5 10*3/uL (ref 1.7–7.7)
Neutrophils Relative %: 76 %
Platelets: 43 10*3/uL — ABNORMAL LOW (ref 150–400)
RBC: 3.47 MIL/uL — ABNORMAL LOW (ref 4.22–5.81)
RDW: 16.6 % — ABNORMAL HIGH (ref 11.5–15.5)
WBC: 8.5 10*3/uL (ref 4.0–10.5)
nRBC: 0.5 % — ABNORMAL HIGH (ref 0.0–0.2)

## 2022-12-14 LAB — PREPARE PLATELET PHERESIS: Unit division: 0

## 2022-12-14 LAB — LACTATE DEHYDROGENASE: LDH: 210 U/L — ABNORMAL HIGH (ref 98–192)

## 2022-12-14 LAB — MAGNESIUM: Magnesium: 1.4 mg/dL — ABNORMAL LOW (ref 1.7–2.4)

## 2022-12-14 LAB — PHOSPHORUS: Phosphorus: 3 mg/dL (ref 2.5–4.6)

## 2022-12-14 LAB — BILIRUBIN, DIRECT: Bilirubin, Direct: 0.5 mg/dL — ABNORMAL HIGH (ref 0.0–0.2)

## 2022-12-14 MED ORDER — POTASSIUM CHLORIDE CRYS ER 20 MEQ PO TBCR
40.0000 meq | EXTENDED_RELEASE_TABLET | Freq: Four times a day (QID) | ORAL | Status: AC
  Administered 2022-12-14 (×2): 40 meq via ORAL
  Filled 2022-12-14 (×3): qty 2

## 2022-12-14 MED ORDER — MAGNESIUM SULFATE 4 GM/100ML IV SOLN
4.0000 g | Freq: Once | INTRAVENOUS | Status: AC
  Administered 2022-12-14: 4 g via INTRAVENOUS
  Filled 2022-12-14: qty 100

## 2022-12-14 NOTE — Progress Notes (Addendum)
PROGRESS NOTE    GURPREET GAUNCE  VWU:[id number] DOB: 08-15-1960 DOA: 12/12/2022 PCP: [name], MD    No chief complaint on file.   Brief Narrative:   Stephen SAGONA is a 62 y.o. male with medical history significant for alcohol use disorder, alcohol associated fatty liver, history of GI bleed from duodenal ulcer, HTN, HLD, pancreatitis who presented to the ED for evaluation of hematochezia and seizure. - He reportedly went to work and had a seizure at work.  He returned home and his wife found him bleeding very weak.  EMS were again called and BP was 80/50 on scene.    Labs show hemoglobin 4.7, platelets 27,000, WBC 6.0, sodium 136, potassium 3.8, bicarb 13, BUN 9, creatinine 1.11, serum glucose 136, AST 163, ALT 43, alk phos 26, total bilirubin 0.8, INR 1.4, lipase 71, magnesium 1.5, lactic acid 7.2.  - CT angio GI bleed study did not show localized GI bleed.  Distal esophagus wall thickening noted.  Chronic bilateral perinephric stranding also seen.  Advanced hepatic steatosis reported.  - started on IV Protonix infusion.  Patient was ordered to receive 2 unit PRBC transfusion and 1 unit platelets.  GI consulted, EEG is negative.  Assessment & Plan:   Principal Problem:   Acute blood loss anemia Active Problems:   Alcohol withdrawal seizure (HCC)   Thrombocytopenia (HCC)   Symptomatic anemia   Hematochezia   ABLA (acute blood loss anemia)   Lactic acidosis   Alcohol use   Acute blood loss anemia due to GI bleeding: Presenting with hematochezia.  Hemoglobin 4.7 and platelets 27.  Patient for lower GI bleed, CTA unrevealing. -Denies any NSAIDs use or any blood thinners use. -He received total of 5 units PRBC during hospital stay, hemoglobin remained stable overnight, . -He received 2 units of platelets in the setting of active GI bleed. -GI input greatly appreciated, differential include hemorrhoidal brisk bleeding, versus diverticular versus rectal varices, less  likely ischemic colitis, plan for outpatient colonoscopy if remains stable   Thrombocytopenia: Received 2 units platelet so far, hemoglobin at 43 K today, continue to monitor closely   Seizure: Patient reportedly had a seizure episode while at work prior to presentation.  Concern is for alcohol withdrawal seizure.  -EEG with no evidence of active seizures   Alcohol use disorder: As above, concern for alcohol withdrawal with seizure prior to presentation.  Patient reports last drink was 6/9. -Started on CIWA protocol with Ativan as needed -Continue thiamine, folate, MVM, will keep on high-dose thiamine for now   Lactic acidosis: Lactic acid 7.5.  He has not been hypotensive.  Suspect this is secondary to seizure activity. . As well due to poor perfusion from anemia c -Continue to trend lactic acid .   Alcohol associated cirrhosis: No evidence of decompensated cirrhosis or hepatic encephalopathy.  GI following.  Hypokalemia Hypomagnesemia -Repleted, monitor closely   DVT prophylaxis: SCD Code Status: Full Family Communication: none at bedisde Disposition:   Status is: Inpatient    Consultants:  GI  Subjective:  No significant events overnight, he denies any complaints today Objective: Vitals:   12/14/22 0825 12/14/22 1149 12/14/22 1200 12/14/22 1300  BP: (!) 133/101 (!) 140/90 (!) 141/93 137/86  Pulse:   84 (!) 108  Resp:      Temp: 98.4 F (36.9 C)     TempSrc: Oral Oral    SpO2:    95%  Weight:      Height:  Intake/Output Summary (Last 24 hours) at 12/14/2022 1445 Last data filed at 12/14/2022 0300 Gross per 24 hour  Intake 955 ml  Output 1850 ml  Net -895 ml   Filed Weights   12/12/22 2339  Weight: 85.1 kg    Examination:  Awake Alert, Oriented X 3, No new F.N deficits, Normal affect Symmetrical Chest wall movement, Good air movement bilaterally, CTAB RRR,No Gallops,Rubs or new Murmurs, No Parasternal Heave +ve B.Sounds, Abd Soft, No  tenderness, No rebound - guarding or rigidity. No Cyanosis, Clubbing or edema, No new Rash or bruise       Data Reviewed: I have personally reviewed following labs and imaging studies  CBC: Recent Labs  Lab 12/12/22 1352 12/12/22 1455 08-Jan-2023 0603 Jan 08, 2023 2141 12/14/22 0703  WBC 6.0  --  7.7  --  8.5  NEUTROABS 4.3  --  5.7  --  6.5  HGB 4.7* 5.8* 6.0* 9.2* 9.6*  HCT 15.3* 17.0* 18.0* 26.9* 28.1*  MCV 89.0  --  83.3  --  81.0  PLT 27*  --  38*  --  43*    Basic Metabolic Panel: Recent Labs  Lab 12/12/22 1352 12/12/22 1455 01-08-23 0603 12/14/22 0703  NA 136 135 135 135  K 3.8 3.8 3.6 3.0*  CL 104 106 105 101  CO2 13*  --  16* 21*  GLUCOSE 136* 133* 94 115*  BUN 9 8 6 6   CREATININE 1.11 1.30* 0.95 0.80  CALCIUM 8.0*  --  7.6* 8.1*  MG 1.5*  --  1.7 1.4*  PHOS  --   --   --  3.0    GFR: Estimated Creatinine Clearance: 104.6 mL/min (by C-G formula based on SCr of 0.8 mg/dL).  Liver Function Tests: Recent Labs  Lab 12/12/22 1352 01-08-23 0603 12/14/22 0703  AST 163* 133* 110*  ALT 43 38 37  ALKPHOS 26* 27* 31*  BILITOT 0.8 1.9* 2.0*  PROT 6.7 6.1* 6.8  ALBUMIN 3.2* 3.0* 3.2*    CBG: No results for input(s): "GLUCAP" in the last 168 hours.   No results found for this or any previous visit (from the past 240 hour(s)).       Radiology Studies: EEG adult  Result Date: 2023-01-08 Charlsie Quest, MD     2023/01/08  8:31 AM Patient Name: Stephen Bowers MRN: [id number] Epilepsy Attending: Charlsie Quest Referring Physician/Provider: Ernie Avena, MD Date: 12/12/2022 Duration: 27.22 mins Patient history: 62yo M patient reportedly had a seizure episode while at work prior to presentation. Concern is for alcohol withdrawal seizure. EEG to evaluate for seizure Level of alertness: Awake AEDs during EEG study: None Technical aspects: This EEG study was done with scalp electrodes positioned according to the 10-20 International system of electrode  placement. Electrical activity was reviewed with band pass filter of 1-70Hz , sensitivity of 7 uV/mm, display speed of 11mm/sec with a 60Hz  notched filter applied as appropriate. EEG data were recorded continuously and digitally stored.  Video monitoring was available and reviewed as appropriate. Description: The posterior dominant rhythm consists of 8-9 Hz activity of moderate voltage (25-35 uV) seen predominantly in posterior head regions, symmetric and reactive to eye opening and eye closing. Physiologic photic driving was not seen during photic stimulation.  Hyperventilation was performed.   IMPRESSION: This study is within normal limits. No seizures or epileptiform discharges were seen throughout the recording. A normal interictal EEG does not exclude the diagnosis of epilepsy. Priyanka Annabelle Harman   CT ANGIO GI  BLEED  Result Date: 12/12/2022 CLINICAL DATA:  Acute mesenteric ischemia. GI bleed. EXAM: CTA ABDOMEN AND PELVIS WITHOUT AND WITH CONTRAST TECHNIQUE: Multidetector CT imaging of the abdomen and pelvis was performed using the standard protocol during bolus administration of intravenous contrast. Multiplanar reconstructed images and MIPs were obtained and reviewed to evaluate the vascular anatomy. RADIATION DOSE REDUCTION: This exam was performed according to the departmental dose-optimization program which includes automated exposure control, adjustment of the mA and/or kV according to patient size and/or use of iterative reconstruction technique. CONTRAST:  OMNIPAQUE IOHEXOL 350 MG/ML SOLN COMPARISON:  Abdominopelvic CT 05/22/2022 FINDINGS: VASCULAR Aorta: Moderately calcified. Normal caliber aorta without aneurysm, dissection, vasculitis or significant stenosis. Celiac: Patent without evidence of aneurysm, dissection, or vasculitis. Mild stenosis at the origin due to the diaphragmatic crus. SMA: Patent without evidence of aneurysm, dissection, vasculitis or significant stenosis. Renals: Single  right and 2 left renal arteries. All renal arteries are patent. Mild atherosclerosis. No dissection, aneurysm or vasculitis. IMA: Patent without evidence of aneurysm, dissection, vasculitis or significant stenosis. Inflow: Patent without evidence of aneurysm, dissection, vasculitis or significant stenosis. Proximal Outflow: Bilateral common femoral and visualized portions of the superficial and profunda femoral arteries are patent without evidence of aneurysm, dissection, vasculitis or significant stenosis. Veins: Venous phase imaging demonstrates patency of the iliac veins and IVC. No thrombus within the mesenteric or portal veins. The splenic vein is patent. Review of the MIP images confirms the above findings. NON-VASCULAR Lower chest: No basilar airspace disease or pleural effusion. Normal heart size with coronary artery calcifications. Hepatobiliary: Advanced hepatic steatosis. Focal fatty sparing adjacent to the gallbladder fossa. No focal liver abnormality Gallbladder physiologically distended, no calcified stone. No biliary dilatation. Pancreas: No ductal dilatation or inflammation. Spleen: No splenomegaly. No focal splenic abnormality. Adrenals/Urinary Tract: No adrenal nodule. There is chronic bilateral perinephric edema. No hydronephrosis. Subcentimeter cyst from the posterior left kidney a punctate calcification and is unchanged from prior exam. No specific imaging follow-up is needed. Unremarkable urinary bladder. Stomach/Bowel: There is no contrast accumulating in the GI tract to localize site of GI bleed. No bowel wall thickening or pneumatosis to suggest mesenteric ischemia. No bowel inflammation or obstruction. Again seen wall thickening of the distal esophagus. The appendix is normal. Lymphatic: No adenopathy. Reproductive: Prostate is unremarkable. Other: No ascites. Mild patchy soft tissue edema in the left flank. Bilateral perinephric and retroperitoneal stranding is chronic and unchanged. Fat  in both inguinal canals. Small subcutaneous lipoma overlies the right hip, chronic. Musculoskeletal: There are no acute or suspicious osseous abnormalities. IMPRESSION: 1. No contrast accumulating in the GI tract to localize site of GI bleed. No evidence of bowel inflammation, pneumatosis, or bowel wall thickening to suggest mesenteric ischemia. 2. Wall thickening about the distal esophagus can be seen with reflux or esophagitis. 3. Chronic bilateral perinephric stranding. 4. Aortic atherosclerosis.  Coronary artery calcifications. 5. Advanced hepatic steatosis. Aortic Atherosclerosis (ICD10-I70.0). Electronically Signed   By: Narda Rutherford M.D.   On: 12/12/2022 18:05        Scheduled Meds:  sodium chloride   Intravenous Once   sodium chloride   Intravenous Once   folic acid  1 mg Oral Daily   furosemide  20 mg Intravenous Once   multivitamin with minerals  1 tablet Oral Daily   potassium chloride  40 mEq Oral Q6H   sodium chloride flush  3 mL Intravenous Q12H   [START ON 12/16/2022] thiamine  100 mg Oral Daily   Or   [  START ON 12/16/2022] thiamine  100 mg Intravenous Daily   Continuous Infusions:  sodium chloride 50 mL/hr at 12/14/22 1438   pantoprazole 8 mg/hr (12/14/22 1152)   thiamine (VITAMIN B1) injection 500 mg (12/14/22 0823)     LOS: 2 days        Huey Bienenstock, MD [facility]   To contact the attending provider between 7A-7P or the covering provider during after hours 7P-7A, please log into the web site [email or web address] and access using universal [place] password for that web site. If you do not have the password, please call the hospital operator.  12/14/2022, 2:45 PM

## 2022-12-14 NOTE — TOC CM/SW Note (Signed)
Transition of Care [facility]) - Inpatient Brief Assessment   Patient Details  Name: Stephen Bowers MRN: [id number] Date of Birth: 1960-11-17  Transition of Care [facility] CM/SW Contact:    Mearl Latin, LCSW Phone Number: 12/14/2022, 4:52 PM   Clinical Narrative: Patient admitted from home. CSW will follow for SA consult. CSW familiar with patient from admission in November of last year.    Transition of Care Asessment: Insurance and Status: Insurance coverage has been reviewed, Selfpay Patient has primary care physician: Yes Home environment has been reviewed: From home Prior level of function:: Independent Prior/Current Home Services: No current home services Social Determinants of Health Reivew: SDOH reviewed no interventions necessary Readmission risk has been reviewed: Yes Transition of care needs: transition of care needs identified, TOC will continue to follow

## 2022-12-14 NOTE — Progress Notes (Addendum)
Attending physician's note   I have taken a history, reviewed the chart, and examined the patient. I performed a substantive portion of this encounter, including complete performance of at least one of the key components, in conjunction with the APP. I agree with the APP's note, impression, and recommendations with my edits.   H/H stable after 2 unit PRBC transfusion yesterday.  Some scant maroon mixed in with stool earlier today.  Otherwise tolerating p.o. advancing diet.  Ok for d/c home from GI standpoint.  Will start making arrangements for outpatient labs 7-10 days after hospital discharge, outpatient follow-up, and outpatient colonoscopy as outlined below.  Inpatient GI service will sign off at this time.  Please do not hesitate to contact with additional questions or concerns.  [address] DO, FACG [phone] office         [facility] Progress Note  CC:  Hematochezia, acute blood loss anemia, history of cirrhosis   Subjective:  No further bleeding since 6/11.  Asking when we think he can go home.  No abdominal pain.  Objective:  Vital signs in last 24 hours: Temp:  [98.3 F (36.8 C)-98.8 F (37.1 C)] 98.4 F (36.9 C) (06/13 0825) Pulse Rate:  [59-93] 83 (06/13 0400) Resp:  [12-19] 18 (06/13 0400) BP: (123-153)/(77-107) 133/101 (06/13 0825) SpO2:  [97 %-100 %] 98 % (06/13 0400) Last BM Date : 12/12/22 General:  Alert, Well-developed, in NAD Heart:  Regular rate and rhythm; no murmurs Pulm:  CTAB.  No W/R/R. Abdomen:  Soft, non-distended.  BS present.  Non-tender.  Extremities:  Without edema. Neurologic:  Alert and oriented x 4;  grossly normal neurologically. Psych:  Alert and cooperative. Normal mood and affect.  Intake/Output from previous day: 06/12 0701 - 06/13 0700 In: 1825 [P.O.:477; I.V.:3; Blood:1345] Out: 4100 [Urine:4100]  Lab Results: Recent Labs    12/12/22 1352 12/12/22 1455 12/13/22 0603 12/13/22 2141 12/14/22 0703   WBC 6.0  --  7.7  --  8.5  HGB 4.7*   < > 6.0* 9.2* 9.6*  HCT 15.3*   < > 18.0* 26.9* 28.1*  PLT 27*  --  38*  --  43*   < > = values in this interval not displayed.   BMET Recent Labs    12/12/22 1352 12/12/22 1455 12/13/22 0603 12/14/22 0703  NA 136 135 135 135  K 3.8 3.8 3.6 3.0*  CL 104 106 105 101  CO2 13*  --  16* 21*  GLUCOSE 136* 133* 94 115*  BUN 9 8 6 6   CREATININE 1.11 1.30* 0.95 0.80  CALCIUM 8.0*  --  7.6* 8.1*   LFT Recent Labs    12/14/22 0703  PROT 6.8  ALBUMIN 3.2*  AST 110*  ALT 37  ALKPHOS 31*  BILITOT 2.0*   PT/INR Recent Labs    12/12/22 1352 12/13/22 0603  LABPROT 17.4* 17.8*  INR 1.4* 1.4*   EEG adult  Result Date: 12/13/2022 Charlsie Quest, MD     12/13/2022  8:31 AM Patient Name: Stephen Bowers MRN: [id number] Epilepsy Attending: Charlsie Quest Referring Physician/Provider: Ernie Avena, MD Date: 12/12/2022 Duration: 27.22 mins Patient history: 62yo M patient reportedly had a seizure episode while at work prior to presentation. Concern is for alcohol withdrawal seizure. EEG to evaluate for seizure Level of alertness: Awake AEDs during EEG study: None Technical aspects: This EEG study was done with scalp electrodes positioned according to the 10-20 International system of electrode placement.  Electrical activity was reviewed with band pass filter of 1-70Hz , sensitivity of 7 uV/mm, display speed of 63mm/sec with a 60Hz  notched filter applied as appropriate. EEG data were recorded continuously and digitally stored.  Video monitoring was available and reviewed as appropriate. Description: The posterior dominant rhythm consists of 8-9 Hz activity of moderate voltage (25-35 uV) seen predominantly in posterior head regions, symmetric and reactive to eye opening and eye closing. Physiologic photic driving was not seen during photic stimulation.  Hyperventilation was performed.   IMPRESSION: This study is within normal limits. No seizures or  epileptiform discharges were seen throughout the recording. A normal interictal EEG does not exclude the diagnosis of epilepsy. Priyanka Annabelle Harman   CT ANGIO GI BLEED  Result Date: 12/12/2022 CLINICAL DATA:  Acute mesenteric ischemia. GI bleed. EXAM: CTA ABDOMEN AND PELVIS WITHOUT AND WITH CONTRAST TECHNIQUE: Multidetector CT imaging of the abdomen and pelvis was performed using the standard protocol during bolus administration of intravenous contrast. Multiplanar reconstructed images and MIPs were obtained and reviewed to evaluate the vascular anatomy. RADIATION DOSE REDUCTION: This exam was performed according to the departmental dose-optimization program which includes automated exposure control, adjustment of the mA and/or kV according to patient size and/or use of iterative reconstruction technique. CONTRAST:  OMNIPAQUE IOHEXOL 350 MG/ML SOLN COMPARISON:  Abdominopelvic CT 05/22/2022 FINDINGS: VASCULAR Aorta: Moderately calcified. Normal caliber aorta without aneurysm, dissection, vasculitis or significant stenosis. Celiac: Patent without evidence of aneurysm, dissection, or vasculitis. Mild stenosis at the origin due to the diaphragmatic crus. SMA: Patent without evidence of aneurysm, dissection, vasculitis or significant stenosis. Renals: Single right and 2 left renal arteries. All renal arteries are patent. Mild atherosclerosis. No dissection, aneurysm or vasculitis. IMA: Patent without evidence of aneurysm, dissection, vasculitis or significant stenosis. Inflow: Patent without evidence of aneurysm, dissection, vasculitis or significant stenosis. Proximal Outflow: Bilateral common femoral and visualized portions of the superficial and profunda femoral arteries are patent without evidence of aneurysm, dissection, vasculitis or significant stenosis. Veins: Venous phase imaging demonstrates patency of the iliac veins and IVC. No thrombus within the mesenteric or portal veins. The splenic vein is patent.  Review of the MIP images confirms the above findings. NON-VASCULAR Lower chest: No basilar airspace disease or pleural effusion. Normal heart size with coronary artery calcifications. Hepatobiliary: Advanced hepatic steatosis. Focal fatty sparing adjacent to the gallbladder fossa. No focal liver abnormality Gallbladder physiologically distended, no calcified stone. No biliary dilatation. Pancreas: No ductal dilatation or inflammation. Spleen: No splenomegaly. No focal splenic abnormality. Adrenals/Urinary Tract: No adrenal nodule. There is chronic bilateral perinephric edema. No hydronephrosis. Subcentimeter cyst from the posterior left kidney a punctate calcification and is unchanged from prior exam. No specific imaging follow-up is needed. Unremarkable urinary bladder. Stomach/Bowel: There is no contrast accumulating in the GI tract to localize site of GI bleed. No bowel wall thickening or pneumatosis to suggest mesenteric ischemia. No bowel inflammation or obstruction. Again seen wall thickening of the distal esophagus. The appendix is normal. Lymphatic: No adenopathy. Reproductive: Prostate is unremarkable. Other: No ascites. Mild patchy soft tissue edema in the left flank. Bilateral perinephric and retroperitoneal stranding is chronic and unchanged. Fat in both inguinal canals. Small subcutaneous lipoma overlies the right hip, chronic. Musculoskeletal: There are no acute or suspicious osseous abnormalities. IMPRESSION: 1. No contrast accumulating in the GI tract to localize site of GI bleed. No evidence of bowel inflammation, pneumatosis, or bowel wall thickening to suggest mesenteric ischemia. 2. Wall thickening about the distal esophagus can  be seen with reflux or esophagitis. 3. Chronic bilateral perinephric stranding. 4. Aortic atherosclerosis.  Coronary artery calcifications. 5. Advanced hepatic steatosis. Aortic Atherosclerosis (ICD10-I70.0). Electronically Signed   By: Narda Rutherford M.D.   On:  12/12/2022 18:05    Assessment / Plan: 1.  Hematochezia: No further bleeding in a couple of days now. Hemoglobin still low yesterday and received 2 further units of PRBCs.  Again differential diagnosis includes brisk hemorrhoidal bleeding, diverticular bleed, rectal varices, or ischemic colitis/IBD which are less likely.  Hgb up to 9.6 grams today after the 2 units PRBCs on 6/12. 2.  Acute blood loss anemia: With above.  Hgb 9.6 grams this AM. 3.  Alcoholic cirrhosis with portal hypertension and thrombocytopenia in the setting of continued alcohol use: MELD 3.0 was 11, MDF 21. 4.  Coagulopathy with INR elevated at 1.4 5.  Thrombocytopenia with platelet count of 43 K  -Needs complete alcohol cessation. -Will advance diet to soft. -Likely plan for outpatient colonoscopy as long as no further bleeding.  Anticipate home later this evening or tomorrow AM if remains without issues.    LOS: 2 days   Princella Pellegrini. Zehr  12/14/2022, 9:40 AM

## 2022-12-15 LAB — CBC WITH DIFFERENTIAL/PLATELET
Abs Immature Granulocytes: 0.06 10*3/uL (ref 0.00–0.07)
Basophils Absolute: 0.1 10*3/uL (ref 0.0–0.1)
Basophils Relative: 1 %
Eosinophils Absolute: 0.1 10*3/uL (ref 0.0–0.5)
Eosinophils Relative: 1 %
HCT: 31.7 % — ABNORMAL LOW (ref 39.0–52.0)
Hemoglobin: 10.9 g/dL — ABNORMAL LOW (ref 13.0–17.0)
Immature Granulocytes: 1 %
Lymphocytes Relative: 6 %
Lymphs Abs: 0.7 10*3/uL (ref 0.7–4.0)
MCH: 29.1 pg (ref 26.0–34.0)
MCHC: 34.4 g/dL (ref 30.0–36.0)
MCV: 84.5 fL (ref 80.0–100.0)
Monocytes Absolute: 0.7 10*3/uL (ref 0.1–1.0)
Monocytes Relative: 6 %
Neutro Abs: 9.3 10*3/uL — ABNORMAL HIGH (ref 1.7–7.7)
Neutrophils Relative %: 85 %
Platelets: 41 10*3/uL — ABNORMAL LOW (ref 150–400)
RBC: 3.75 MIL/uL — ABNORMAL LOW (ref 4.22–5.81)
RDW: 16.8 % — ABNORMAL HIGH (ref 11.5–15.5)
WBC: 10.9 10*3/uL — ABNORMAL HIGH (ref 4.0–10.5)
nRBC: 0.2 % (ref 0.0–0.2)

## 2022-12-15 LAB — COMPREHENSIVE METABOLIC PANEL
ALT: 39 U/L (ref 0–44)
AST: 118 U/L — ABNORMAL HIGH (ref 15–41)
Albumin: 3.6 g/dL (ref 3.5–5.0)
Alkaline Phosphatase: 37 U/L — ABNORMAL LOW (ref 38–126)
Anion gap: 13 (ref 5–15)
BUN: <5 mg/dL — ABNORMAL LOW (ref 6–20)
CO2: 20 mmol/L — ABNORMAL LOW (ref 22–32)
Calcium: 8.3 mg/dL — ABNORMAL LOW (ref 8.9–10.3)
Chloride: 100 mmol/L (ref 98–111)
Creatinine, Ser: 0.87 mg/dL (ref 0.61–1.24)
GFR, Estimated: >60 mL/min (ref >60)
Glucose, Bld: 117 mg/dL — ABNORMAL HIGH (ref 70–99)
Potassium: 3.3 mmol/L — ABNORMAL LOW (ref 3.5–5.1)
Sodium: 133 mmol/L — ABNORMAL LOW (ref 135–145)
Total Bilirubin: 1.6 mg/dL — ABNORMAL HIGH (ref 0.3–1.2)
Total Protein: 7.5 g/dL (ref 6.5–8.1)

## 2022-12-15 LAB — HAPTOGLOBIN: Haptoglobin: 98 mg/dL (ref 29–370)

## 2022-12-15 LAB — PHOSPHORUS: Phosphorus: 2.8 mg/dL (ref 2.5–4.6)

## 2022-12-15 LAB — MAGNESIUM: Magnesium: 1.9 mg/dL (ref 1.7–2.4)

## 2022-12-15 MED ORDER — POTASSIUM CHLORIDE CRYS ER 20 MEQ PO TBCR: 40.0000 meq | EXTENDED_RELEASE_TABLET | Freq: Once | ORAL | Status: DC

## 2022-12-15 MED ORDER — CHLORDIAZEPOXIDE HCL 5 MG PO CAPS
10.0000 mg | ORAL_CAPSULE | Freq: Four times a day (QID) | ORAL | Status: DC
  Administered 2022-12-15 – 2022-12-18 (×13): 10 mg via ORAL
  Filled 2022-12-15 (×13): qty 2

## 2022-12-15 MED ORDER — CLONIDINE HCL 0.1 MG PO TABS
0.1000 mg | ORAL_TABLET | Freq: Three times a day (TID) | ORAL | Status: AC
  Administered 2022-12-15 – 2022-12-16 (×3): 0.1 mg via ORAL
  Filled 2022-12-15 (×5): qty 1

## 2022-12-15 MED ORDER — POTASSIUM CHLORIDE CRYS ER 20 MEQ PO TBCR
40.0000 meq | EXTENDED_RELEASE_TABLET | Freq: Four times a day (QID) | ORAL | Status: AC
  Administered 2022-12-15 (×2): 40 meq via ORAL
  Filled 2022-12-15 (×2): qty 2

## 2022-12-15 MED ORDER — PANTOPRAZOLE SODIUM 40 MG PO TBEC
40.0000 mg | DELAYED_RELEASE_TABLET | Freq: Two times a day (BID) | ORAL | Status: DC
  Administered 2022-12-15 – 2022-12-23 (×17): 40 mg via ORAL
  Filled 2022-12-15 (×17): qty 1

## 2022-12-15 NOTE — Progress Notes (Addendum)
Mobility Specialist Progress Note   12/15/22 1533  Mobility  Activity Ambulated with assistance to bathroom  Level of Assistance +2 (takes two people) (Heavy)  Assistive Device Other (Comment) (HHA)  Distance Ambulated (ft) 16 ft  Activity Response Tolerated fair  Mobility Referral Yes  $Mobility charge 1 Mobility  Mobility Specialist Start Time (ACUTE ONLY) 1450  Mobility Specialist Stop Time (ACUTE ONLY) 1531  Mobility Specialist Time Calculation (min) (ACUTE ONLY) 41 min   Pre Mobility: 91 HR, 95% SpO2 During Mobility: 122 HR, 92% SpO2 Post Mobility: 108 HR, 94% SpO2  Received pt in bed lethargic and difficult to keep aroused throughout entire session. ModA to get EOB and MinA to maintain sitting balance. Pt incontinent throughout entire sessions, attempted to transfer to BR but pt voided on floor. Heavy modA to maintain pt's balance d/t lethargy and posterior lean when ambulating to and from BR. Placed back on EOB where pt required +2A for pericare. Once clean, placed supine in bed w/ call bell in reach, bed alarm on and RN present in room.   Frederico Hamman Mobility Specialist Please contact via SecureChat or  Rehab office at [phone]

## 2022-12-15 NOTE — Progress Notes (Signed)
PROGRESS NOTE    Stephen Bowers  ZOX:[id number] DOB: 1960-10-17 DOA: 12/12/2022 PCP: [name], MD    No chief complaint on file.   Brief Narrative:   Stephen Bowers is a 62 y.o. male with medical history significant for alcohol use disorder, alcohol associated fatty liver, history of GI bleed from duodenal ulcer, HTN, HLD, pancreatitis who presented to the ED for evaluation of hematochezia and seizure. - He reportedly went to work and had a seizure at work.  He returned home and his wife found him bleeding very weak.  EMS were again called and BP was 80/50 on scene.    Labs show hemoglobin 4.7, platelets 27,000, WBC 6.0, sodium 136, potassium 3.8, bicarb 13, BUN 9, creatinine 1.11, serum glucose 136, AST 163, ALT 43, alk phos 26, total bilirubin 0.8, INR 1.4, lipase 71, magnesium 1.5, lactic acid 7.2.  - CT angio GI bleed study did not show localized GI bleed.  Distal esophagus wall thickening noted.  Chronic bilateral perinephric stranding also seen.  Advanced hepatic steatosis reported.  - started on IV Protonix infusion.  Patient was ordered to receive 2 unit PRBC transfusion and 1 unit platelets.  GI consulted, EEG is negative.  Assessment & Plan:   Principal Problem:   Acute blood loss anemia Active Problems:   Alcohol withdrawal seizure (HCC)   Thrombocytopenia (HCC)   Symptomatic anemia   Hematochezia   ABLA (acute blood loss anemia)   Lactic acidosis   Alcohol use   Acute blood loss anemia due to GI bleeding: Presenting with hematochezia.  Hemoglobin 4.7 and platelets 27.  Patient for lower GI bleed, CTA unrevealing. -Denies any NSAIDs use or any blood thinners use. -He received total of 5 units PRBC during hospital stay, hemoglobin remained stable .. -He received 2 units of platelets in the setting of active GI bleed. -GI input greatly appreciated, differential include hemorrhoidal brisk bleeding, versus diverticular versus rectal varices, less likely  ischemic colitis, plan for outpatient colonoscopy if remains stable  Alcohol abuse Alcohol withdrawal with DTs -As above, concern for alcohol withdrawal with seizure prior to presentation.  Patient reports last drink was 6/9. -He was kept on CIWA protocol, he is in withdrawal with DTs this morning, required multiple IV Ativan dosing overnight, will start on scheduled Librium and clonidine -On high-dose IV thiamine, multivitamins and folic acid  Thrombocytopenia: Received 2 units platelet so far, hemoglobin at 41 K today, continue to monitor closely   Seizure: Patient reportedly had a seizure episode while at work prior to presentation.  Concern is for alcohol withdrawal seizure.  -EEG with no evidence of active seizures   Alcohol use disorder:  -Started on CIWA protocol with Ativan as needed -Continue thiamine, folate, MVM, will keep on high-dose thiamine for now   Lactic acidosis: Lactic acid 7.5.  He has not been hypotensive.  Suspect this is secondary to seizure activity. . As well due to poor perfusion from anemia c -Continue to trend lactic acid .   Alcohol associated cirrhosis: No evidence of decompensated cirrhosis or hepatic encephalopathy.  GI following.  Hypokalemia Hypomagnesemia -Repleted, monitor closely   DVT prophylaxis: SCD Code Status: Full Family Communication: none at bedisde Disposition:   Status is: Inpatient    Consultants:  GI  Subjective:  Patient with significant withdrawals overnight required multiple dosing of IV Ativan Objective: Vitals:   12/14/22 2053 12/14/22 2200 12/14/22 2302 12/15/22 0400  BP: (!) 153/95 (!) 144/102 (!) 141/110 (!) 166/88  Pulse: (!) 112 (!) 105 (!) 114 99  Resp:   19 (!) 21  Temp:   98.4 F (36.9 C) 98.3 F (36.8 C)  TempSrc:   Oral Oral  SpO2:      Weight:      Height:        Intake/Output Summary (Last 24 hours) at 12/15/2022 1046 Last data filed at 12/14/2022 2220 Gross per 24 hour  Intake 343 ml   Output 900 ml  Net -557 ml   Filed Weights   12/12/22 2339  Weight: 85.1 kg    Examination:   patient is somnolent, but wakes and  up open eyes, significantly confused, with tremors symmetrical Chest wall movement, Good air movement bilaterally, CTAB RRR,No Gallops,Rubs or new Murmurs, No Parasternal Heave +ve B.Sounds, Abd Soft, No tenderness, No rebound - guarding or rigidity. No Cyanosis, Clubbing or edema, No new Rash or bruise       Data Reviewed: I have personally reviewed following labs and imaging studies  CBC: Recent Labs  Lab 12/12/22 1352 12/12/22 1455 12/13/22 0603 12/13/22 2141 12/14/22 0703 12/14/22 1429 12/14/22 2144 12/15/22 0353  WBC 6.0  --  7.7  --  8.5  --   --  10.9*  NEUTROABS 4.3  --  5.7  --  6.5  --   --  9.3*  HGB 4.7*   < > 6.0* 9.2* 9.6* 10.1* 10.1* 10.9*  HCT 15.3*   < > 18.0* 26.9* 28.1* 29.3* 29.3* 31.7*  MCV 89.0  --  83.3  --  81.0  --   --  84.5  PLT 27*  --  38*  --  43*  --   --  41*   < > = values in this interval not displayed.    Basic Metabolic Panel: Recent Labs  Lab 12/12/22 1352 12/12/22 1455 12/13/22 0603 12/14/22 0703 12/15/22 0353  NA 136 135 135 135 133*  K 3.8 3.8 3.6 3.0* 3.3*  CL 104 106 105 101 100  CO2 13*  --  16* 21* 20*  GLUCOSE 136* 133* 94 115* 117*  BUN 9 8 6 6  <5*  CREATININE 1.11 1.30* 0.95 0.80 0.87  CALCIUM 8.0*  --  7.6* 8.1* 8.3*  MG 1.5*  --  1.7 1.4* 1.9  PHOS  --   --   --  3.0 2.8    GFR: Estimated Creatinine Clearance: 96.2 mL/min (by C-G formula based on SCr of 0.87 mg/dL).  Liver Function Tests: Recent Labs  Lab 12/12/22 1352 12/13/22 0603 12/14/22 0703 12/15/22 0353  AST 163* 133* 110* 118*  ALT 43 38 37 39  ALKPHOS 26* 27* 31* 37*  BILITOT 0.8 1.9* 2.0* 1.6*  PROT 6.7 6.1* 6.8 7.5  ALBUMIN 3.2* 3.0* 3.2* 3.6    CBG: No results for input(s): "GLUCAP" in the last 168 hours.   No results found for this or any previous visit (from the past 240 hour(s)).        Radiology Studies: No results found.      Scheduled Meds:  sodium chloride   Intravenous Once   sodium chloride   Intravenous Once   chlordiazePOXIDE  10 mg Oral QID   cloNIDine  0.1 mg Oral TID   folic acid  1 mg Oral Daily   furosemide  20 mg Intravenous Once   multivitamin with minerals  1 tablet Oral Daily   potassium chloride  40 mEq Oral Q6H   sodium chloride flush  3 mL  Intravenous Q12H   [START ON 12/16/2022] thiamine  100 mg Oral Daily   Or   [START ON 12/16/2022] thiamine  100 mg Intravenous Daily   Continuous Infusions:  pantoprazole 8 mg/hr (12/15/22 0933)   thiamine (VITAMIN B1) injection 500 mg (12/15/22 0934)     LOS: 3 days        Huey Bienenstock, MD [facility]   To contact the attending provider between 7A-7P or the covering provider during after hours 7P-7A, please log into the web site [email or web address] and access using universal [place] password for that web site. If you do not have the password, please call the hospital operator.  12/15/2022, 10:46 AM

## 2022-12-16 LAB — COMPREHENSIVE METABOLIC PANEL
ALT: 35 U/L (ref 0–44)
AST: 106 U/L — ABNORMAL HIGH (ref 15–41)
Albumin: 3 g/dL — ABNORMAL LOW (ref 3.5–5.0)
Alkaline Phosphatase: 33 U/L — ABNORMAL LOW (ref 38–126)
Anion gap: 10 (ref 5–15)
BUN: 6 mg/dL (ref 6–20)
CO2: 19 mmol/L — ABNORMAL LOW (ref 22–32)
Calcium: 8.4 mg/dL — ABNORMAL LOW (ref 8.9–10.3)
Chloride: 102 mmol/L (ref 98–111)
Creatinine, Ser: 0.86 mg/dL (ref 0.61–1.24)
GFR, Estimated: >60 mL/min (ref >60)
Glucose, Bld: 122 mg/dL — ABNORMAL HIGH (ref 70–99)
Potassium: 3.9 mmol/L (ref 3.5–5.1)
Sodium: 131 mmol/L — ABNORMAL LOW (ref 135–145)
Total Bilirubin: 1.1 mg/dL (ref 0.3–1.2)
Total Protein: 6.5 g/dL (ref 6.5–8.1)

## 2022-12-16 LAB — CBC WITH DIFFERENTIAL/PLATELET
Abs Immature Granulocytes: 0.03 10*3/uL (ref 0.00–0.07)
Basophils Absolute: 0.1 10*3/uL (ref 0.0–0.1)
Basophils Relative: 1 %
Eosinophils Absolute: 0.1 10*3/uL (ref 0.0–0.5)
Eosinophils Relative: 2 %
HCT: 30 % — ABNORMAL LOW (ref 39.0–52.0)
Hemoglobin: 10.2 g/dL — ABNORMAL LOW (ref 13.0–17.0)
Immature Granulocytes: 0 %
Lymphocytes Relative: 10 %
Lymphs Abs: 0.8 10*3/uL (ref 0.7–4.0)
MCH: 29 pg (ref 26.0–34.0)
MCHC: 34 g/dL (ref 30.0–36.0)
MCV: 85.2 fL (ref 80.0–100.0)
Monocytes Absolute: 1 10*3/uL (ref 0.1–1.0)
Monocytes Relative: 13 %
Neutro Abs: 5.5 10*3/uL (ref 1.7–7.7)
Neutrophils Relative %: 74 %
Platelets: 29 10*3/uL — CL (ref 150–400)
RBC: 3.52 MIL/uL — ABNORMAL LOW (ref 4.22–5.81)
RDW: 17.2 % — ABNORMAL HIGH (ref 11.5–15.5)
WBC: 7.5 10*3/uL (ref 4.0–10.5)
nRBC: 0.3 % — ABNORMAL HIGH (ref 0.0–0.2)

## 2022-12-16 LAB — PHOSPHORUS: Phosphorus: 3.3 mg/dL (ref 2.5–4.6)

## 2022-12-16 LAB — MAGNESIUM: Magnesium: 1.7 mg/dL (ref 1.7–2.4)

## 2022-12-16 MED ORDER — LORAZEPAM 1 MG PO TABS: 1.0000 mg | ORAL_TABLET | ORAL | Status: DC | PRN

## 2022-12-16 MED ORDER — MAGNESIUM SULFATE 2 GM/50ML IV SOLN
2.0000 g | Freq: Once | INTRAVENOUS | Status: AC
  Administered 2022-12-16: 2 g via INTRAVENOUS
  Filled 2022-12-16: qty 50

## 2022-12-16 MED ORDER — LORAZEPAM 2 MG/ML IJ SOLN: 1.0000 mg | INTRAMUSCULAR | Status: DC | PRN

## 2022-12-16 NOTE — Progress Notes (Signed)
PT Cancellation Note  Patient Details Name: Stephen Bowers MRN: [id number] DOB: 15-Jun-1961   Cancelled Treatment:    Reason Eval/Treat Not Completed: Other (comment) (eating lunch). Pt currently eating lunch. PT will follow up for evaluation later as appropriate/available. Thank you.   Leonie Man 12/16/2022, 1:59 PM

## 2022-12-16 NOTE — Progress Notes (Signed)
Pt has a critical result; Platelet count: 29 as relayed by lab personnel. No significant complaints from the patient nor any signs of bleeding. Delma Post, MD made aware.

## 2022-12-16 NOTE — Progress Notes (Addendum)
PROGRESS NOTE    Stephen Bowers  ZOX:[id number] DOB: 1960/09/11 DOA: 12/12/2022 PCP: [name], MD    No chief complaint on file.   Brief Narrative:   Stephen Bowers is a 62 y.o. male with medical history significant for alcohol use disorder, alcohol associated fatty liver, history of GI bleed from duodenal ulcer, HTN, HLD, pancreatitis who presented to the ED for evaluation of hematochezia and seizure. - He reportedly went to work and had a seizure at work.  He returned home and his wife found him bleeding very weak.  EMS were again called and BP was 80/50 on scene.    Labs show hemoglobin 4.7, platelets 27,000, WBC 6.0, sodium 136, potassium 3.8, bicarb 13, BUN 9, creatinine 1.11, serum glucose 136, AST 163, ALT 43, alk phos 26, total bilirubin 0.8, INR 1.4, lipase 71, magnesium 1.5, lactic acid 7.2.  - CT angio GI bleed study did not show localized GI bleed.  Distal esophagus wall thickening noted.  Chronic bilateral perinephric stranding also seen.  Advanced hepatic steatosis reported.  - started on IV Protonix infusion.  Patient was ordered to receive 2 unit PRBC transfusion and 1 unit platelets.  GI consulted, EEG is negative.  Assessment & Plan:   Principal Problem:   Acute blood loss anemia Active Problems:   Alcohol withdrawal seizure (HCC)   Thrombocytopenia (HCC)   Symptomatic anemia   Hematochezia   ABLA (acute blood loss anemia)   Lactic acidosis   Alcohol use   Acute blood loss anemia due to GI bleeding: Presenting with hematochezia.  Hemoglobin 4.7 and platelets 27.  Patient for lower GI bleed, CTA unrevealing. -Denies any NSAIDs use or any blood thinners use. -He received total of 5 units PRBC during hospital stay, hemoglobin remained stable .. -He received 2 units of platelets in the setting of active GI bleed. -GI input greatly appreciated, differential include hemorrhoidal brisk bleeding, versus diverticular versus rectal varices, less likely  ischemic colitis, plan for outpatient colonoscopy if remains stable  Alcohol abuse Alcohol withdrawal with DTs -As above, concern for alcohol withdrawal with seizure prior to presentation.  Patient reports last drink was 6/9. -He was kept on CIWA protocol, he is in withdrawal with DTs this morning, required multiple IV Ativan dosing overnight, started on scheduled Librium and clonidine, improving, but remains in DTs today. -On high-dose IV thiamine, multivitamins and folic acid  Thrombocytopenia: Received 2 units platelet, hemoglobin still trending down at 29, discussed with hematology Dr. Parke Poisson, is most likely in the setting of alcohol toxicity, no evidence of hemolysis.   Seizure: Patient reportedly had a seizure episode while at work prior to presentation.  Concern is for alcohol withdrawal seizure.  -EEG with no evidence of active seizures     Lactic acidosis: Lactic acid 7.5.  He has not been hypotensive.  Suspect this is secondary to seizure activity. . As well due to poor perfusion from anemia c -Continue to trend lactic acid .   Alcohol associated cirrhosis: No evidence of decompensated cirrhosis or hepatic encephalopathy.  GI following.  Hypokalemia Hypomagnesemia -Repleted, monitor closely   DVT prophylaxis: SCD Code Status: Full Family Communication: Discussed with wife by phone Disposition:   Status is: Inpatient    Consultants:  GI  Subjective: No significant events overnight as discussed with staff, patient denies any complaints today Objective: Vitals:   12/15/22 2209 12/15/22 2324 12/16/22 0330 12/16/22 0855  BP: 103/80 91/71 (!) 142/91   Pulse:  80 66  Resp:  18 18   Temp:  98 F (36.7 C) 98.3 F (36.8 C) 97.6 F (36.4 C)  TempSrc:  Oral Oral Oral  SpO2:  98% 96%   Weight:      Height:        Intake/Output Summary (Last 24 hours) at 12/16/2022 1215 Last data filed at 12/15/2022 2209 Gross per 24 hour  Intake 3 ml  Output --  Net 3 ml    Filed Weights   12/12/22 2339  Weight: 85.1 kg    Examination:   Patient  is awake, but confused and restless Symmetrical Chest wall movement, Good air movement bilaterally, CTAB RRR,No Gallops,Rubs or new Murmurs, No Parasternal Heave +ve B.Sounds, Abd Soft, No tenderness, No rebound - guarding or rigidity. No Cyanosis, Clubbing or edema, No new Rash or bruise        Data Reviewed: I have personally reviewed following labs and imaging studies  CBC: Recent Labs  Lab 12/12/22 1352 12/12/22 1455 12/13/22 0603 12/13/22 2141 12/14/22 0703 12/14/22 1429 12/14/22 2144 12/15/22 0353 12/16/22 0233  WBC 6.0  --  7.7  --  8.5  --   --  10.9* 7.5  NEUTROABS 4.3  --  5.7  --  6.5  --   --  9.3* 5.5  HGB 4.7*   < > 6.0*   < > 9.6* 10.1* 10.1* 10.9* 10.2*  HCT 15.3*   < > 18.0*   < > 28.1* 29.3* 29.3* 31.7* 30.0*  MCV 89.0  --  83.3  --  81.0  --   --  84.5 85.2  PLT 27*  --  38*  --  43*  --   --  41* 29*   < > = values in this interval not displayed.    Basic Metabolic Panel: Recent Labs  Lab 12/12/22 1352 12/12/22 1455 12/13/22 0603 12/14/22 0703 12/15/22 0353 12/16/22 0233  NA 136 135 135 135 133* 131*  K 3.8 3.8 3.6 3.0* 3.3* 3.9  CL 104 106 105 101 100 102  CO2 13*  --  16* 21* 20* 19*  GLUCOSE 136* 133* 94 115* 117* 122*  BUN 9 8 6 6  <5* 6  CREATININE 1.11 1.30* 0.95 0.80 0.87 0.86  CALCIUM 8.0*  --  7.6* 8.1* 8.3* 8.4*  MG 1.5*  --  1.7 1.4* 1.9 1.7  PHOS  --   --   --  3.0 2.8 3.3    GFR: Estimated Creatinine Clearance: 97.3 mL/min (by C-G formula based on SCr of 0.86 mg/dL).  Liver Function Tests: Recent Labs  Lab 12/12/22 1352 12/13/22 0603 12/14/22 0703 12/15/22 0353 12/16/22 0233  AST 163* 133* 110* 118* 106*  ALT 43 38 37 39 35  ALKPHOS 26* 27* 31* 37* 33*  BILITOT 0.8 1.9* 2.0* 1.6* 1.1  PROT 6.7 6.1* 6.8 7.5 6.5  ALBUMIN 3.2* 3.0* 3.2* 3.6 3.0*    CBG: No results for input(s): "GLUCAP" in the last 168 hours.   No results found  for this or any previous visit (from the past 240 hour(s)).       Radiology Studies: No results found.      Scheduled Meds:  sodium chloride   Intravenous Once   sodium chloride   Intravenous Once   chlordiazePOXIDE  10 mg Oral QID   cloNIDine  0.1 mg Oral TID   folic acid  1 mg Oral Daily   furosemide  20 mg Intravenous Once   multivitamin with minerals  1 tablet Oral Daily   pantoprazole  40 mg Oral BID   sodium chloride flush  3 mL Intravenous Q12H   thiamine  100 mg Oral Daily   Or   thiamine  100 mg Intravenous Daily   Continuous Infusions:  thiamine (VITAMIN B1) injection 500 mg (12/16/22 0923)     LOS: 4 days        Huey Bienenstock, MD [facility]   To contact the attending provider between 7A-7P or the covering provider during after hours 7P-7A, please log into the web site [email or web address] and access using universal [place] password for that web site. If you do not have the password, please call the hospital operator.  12/16/2022, 12:15 PM

## 2022-12-17 LAB — CBC WITH DIFFERENTIAL/PLATELET
Abs Immature Granulocytes: 0.03 10*3/uL (ref 0.00–0.07)
Basophils Absolute: 0.1 10*3/uL (ref 0.0–0.1)
Basophils Relative: 1 %
Eosinophils Absolute: 0.2 10*3/uL (ref 0.0–0.5)
Eosinophils Relative: 2 %
HCT: 27.5 % — ABNORMAL LOW (ref 39.0–52.0)
Hemoglobin: 9.3 g/dL — ABNORMAL LOW (ref 13.0–17.0)
Immature Granulocytes: 0 %
Lymphocytes Relative: 15 %
Lymphs Abs: 1.2 10*3/uL (ref 0.7–4.0)
MCH: 28.5 pg (ref 26.0–34.0)
MCHC: 33.8 g/dL (ref 30.0–36.0)
MCV: 84.4 fL (ref 80.0–100.0)
Monocytes Absolute: 1.6 10*3/uL — ABNORMAL HIGH (ref 0.1–1.0)
Monocytes Relative: 20 %
Neutro Abs: 4.8 10*3/uL (ref 1.7–7.7)
Neutrophils Relative %: 62 %
Platelets: 28 10*3/uL — CL (ref 150–400)
RBC: 3.26 MIL/uL — ABNORMAL LOW (ref 4.22–5.81)
RDW: 17.2 % — ABNORMAL HIGH (ref 11.5–15.5)
WBC: 7.8 10*3/uL (ref 4.0–10.5)
nRBC: 0 % (ref 0.0–0.2)

## 2022-12-17 LAB — COMPREHENSIVE METABOLIC PANEL
ALT: 41 U/L (ref 0–44)
AST: 116 U/L — ABNORMAL HIGH (ref 15–41)
Albumin: 2.9 g/dL — ABNORMAL LOW (ref 3.5–5.0)
Alkaline Phosphatase: 30 U/L — ABNORMAL LOW (ref 38–126)
Anion gap: 8 (ref 5–15)
BUN: 8 mg/dL (ref 6–20)
CO2: 22 mmol/L (ref 22–32)
Calcium: 8.7 mg/dL — ABNORMAL LOW (ref 8.9–10.3)
Chloride: 101 mmol/L (ref 98–111)
Creatinine, Ser: 0.8 mg/dL (ref 0.61–1.24)
GFR, Estimated: >60 mL/min (ref >60)
Glucose, Bld: 127 mg/dL — ABNORMAL HIGH (ref 70–99)
Potassium: 3.8 mmol/L (ref 3.5–5.1)
Sodium: 131 mmol/L — ABNORMAL LOW (ref 135–145)
Total Bilirubin: 0.9 mg/dL (ref 0.3–1.2)
Total Protein: 6.3 g/dL — ABNORMAL LOW (ref 6.5–8.1)

## 2022-12-17 LAB — MAGNESIUM: Magnesium: 1.7 mg/dL (ref 1.7–2.4)

## 2022-12-17 LAB — PHOSPHORUS: Phosphorus: 4.3 mg/dL (ref 2.5–4.6)

## 2022-12-17 MED ORDER — MAGNESIUM SULFATE 2 GM/50ML IV SOLN
2.0000 g | Freq: Once | INTRAVENOUS | Status: AC
  Administered 2022-12-17: 2 g via INTRAVENOUS
  Filled 2022-12-17: qty 50

## 2022-12-17 NOTE — Evaluation (Signed)
Physical Therapy Evaluation Patient Details Name: CANON WYKLE MRN: [id number] DOB: 06-03-1961 Today's Date: 12/17/2022  History of Present Illness  KIEN COLOMBO is a 62 y.o. male who presented 12/12/22 to the ED for evaluation of hematochezia and seizure. CTA negative for GI bleed 6/11. With medical history significant for alcohol use disorder, alcohol associated fatty liver, history of GI bleed from duodenal ulcer, HTN, HLD, pancreatitis.   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 Today, pt with min G for transfers and gait using no AD, amb 120' with reduced speed, pt reports feels slower than usual. No significant LOB observed throughout session although PT will trial SPC vs. RW on next visit for additional support and improved gait speed. Pt will continue to benefit from acute PT during their admission to improve functional mobility and endurance.     Recommendations for follow up therapy are one component of a multi-disciplinary discharge planning process, led by the attending physician.  Recommendations may be updated based on patient status, additional functional criteria and insurance authorization.  Follow Up Recommendations       Assistance Recommended at Discharge PRN  Patient can return home with the following  A little help with walking and/or transfers;A little help with bathing/dressing/bathroom;Assistance with cooking/housework;Assist for transportation;Help with stairs or ramp for entrance    Equipment Recommendations Other (comment) (Pending progress)  Recommendations for Other Services       Functional Status Assessment Patient has had a recent decline in their functional status and demonstrates the ability to make significant improvements in function in a reasonable and predictable amount of  time.     Precautions / Restrictions Precautions Precautions: Fall Restrictions Weight Bearing Restrictions: No      Mobility  Bed Mobility Overal bed mobility: Independent             General bed mobility comments: Increased time, attempts to sit while tangled in bed sheets and needs cueing to correct    Transfers Overall transfer level: Needs assistance Equipment used: None Transfers: Sit to/from Stand Sit to Stand: Min guard           General transfer comment: Stands on 2nd attempt, uses BUE to push, increased trunk flexion    Ambulation/Gait Ambulation/Gait assistance: Min guard   Assistive device: None Gait Pattern/deviations: Step-through pattern, Drifts right/left, Decreased stride length Gait velocity: reduced Gait velocity interpretation: 1.31 - 2.62 ft/sec, indicative of limited community ambulator Pre-gait activities: multidirectional steps EOB General Gait Details: Reduced speed, one event of lateral step to correct minor LOB WNL. Would benefit from using [facility] for additional support and longer distances.  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 Unchange home setup from 05/24/22 PT eval, confirmed with eval today.    Prior Function Prior Level of Function : Independent/Modified Independent;Driving             Mobility Comments:  Pt reports they are on their feet throughout the day with custodial work, independent.       Hand Dominance   Dominant Hand: Right    Extremity/Trunk Assessment   Upper Extremity Assessment Upper Extremity Assessment: Overall WFL for tasks assessed    Lower Extremity Assessment Lower Extremity Assessment: Overall WFL for tasks assessed    Cervical / Trunk Assessment Cervical / Trunk Assessment: Normal  Communication   Communication: No difficulties  Cognition Arousal/Alertness: Awake/alert Behavior During Therapy: WFL for tasks assessed/performed                                            General Comments      Exercises     Assessment/Plan    PT Assessment Patient needs continued PT services  PT Problem List Decreased activity tolerance;Decreased balance       PT Treatment Interventions DME instruction;Gait training;Stair training    PT Goals (Current goals can be found in the Care Plan section)  Acute Rehab PT Goals Patient Stated Goal: go home PT Goal Formulation: With patient Time For Goal Achievement: 12/31/22 Potential to Achieve Goals: Good    Frequency Min 3X/week     Co-evaluation               AM-PAC PT "6 Clicks" Mobility  Outcome Measure Help needed turning from your back to your side while in a flat bed without using bedrails?: None Help needed moving from lying on your back to sitting on the side of a flat bed without using bedrails?: None Help needed moving to and from a bed to a chair (including a wheelchair)?: A Little Help needed standing up from a chair using your arms (e.g., wheelchair or bedside chair)?: A Little Help needed to walk in hospital room?: A Little Help needed climbing 3-5 steps with a railing? : A Little 6 Click Score: 20    End of Session Equipment Utilized During Treatment: Gait belt Activity Tolerance: Patient tolerated treatment well Patient left: in chair;with call bell/phone within  reach;with chair alarm set Nurse Communication: Mobility status PT Visit Diagnosis: Unsteadiness on feet (R26.81);Other abnormalities of gait and mobility (R26.89)    Time: 1100-1125 PT Time Calculation (min) (ACUTE ONLY): 25 min   Charges:   PT Evaluation $PT Eval Moderate Complexity: 1 Mod PT Treatments $Therapeutic Activity: 8-22 mins        Hendricks Milo, SPT  Acute Rehabilitation Services   Hendricks Milo 12/17/2022, 1:16 PM

## 2022-12-17 NOTE — Progress Notes (Signed)
PROGRESS NOTE    Stephen Bowers  ZOX:[id number] DOB: January 19, 1961 DOA: 12/12/2022 PCP: [name], MD    No chief complaint on file.   Brief Narrative:   Stephen Bowers is a 62 y.o. male with medical history significant for alcohol use disorder, alcohol associated fatty liver, history of GI bleed from duodenal ulcer, HTN, HLD, pancreatitis who presented to the ED for evaluation of hematochezia and seizure. - He reportedly went to work and had a seizure at work.  He returned home and his wife found him bleeding very weak.  EMS were again called and BP was 80/50 on scene.    Labs show hemoglobin 4.7, platelets 27,000, WBC 6.0, sodium 136, potassium 3.8, bicarb 13, BUN 9, creatinine 1.11, serum glucose 136, AST 163, ALT 43, alk phos 26, total bilirubin 0.8, INR 1.4, lipase 71, magnesium 1.5, lactic acid 7.2.  - CT angio GI bleed study did not show localized GI bleed.  Distal esophagus wall thickening noted.  Chronic bilateral perinephric stranding also seen.  Advanced hepatic steatosis reported.  - started on IV Protonix infusion.  Patient was ordered to receive total of 5 units PRBC, and 2 units of platelet, GI has been consulted, who recommended conservative management currently, and further workup as an outpatient, . -Patient did develop severe DTs for which she was started on CIWA protocol, and required maintenance Librium.     Assessment & Plan:   Principal Problem:   Acute blood loss anemia Active Problems:   Alcohol withdrawal seizure (HCC)   Thrombocytopenia (HCC)   Symptomatic anemia   Hematochezia   ABLA (acute blood loss anemia)   Lactic acidosis   Alcohol use   Acute blood loss anemia due to GI bleeding: Presenting with hematochezia.  Hemoglobin 4.7 and platelets 27.  Patient for lower GI bleed, CTA unrevealing. -Denies any NSAIDs use or any blood thinners use. -He received total of 5 units PRBC during hospital stay, hemoglobin remained stable .. -He received  2 units of platelets in the setting of active GI bleed. -GI input greatly appreciated, differential include hemorrhoidal brisk bleeding, versus diverticular versus rectal varices, less likely ischemic colitis, plan for outpatient colonoscopy if remains stable  Alcohol abuse Alcohol withdrawal with DTs -As above, concern for alcohol withdrawal with seizure prior to presentation.  Patient reports last drink was 6/9. -He was kept on CIWA protocol, he is in withdrawal with DTs , requiring multiple IV Ativan doses, added scheduled Librium and clonidine . -On high-dose IV thiamine, multivitamins and folic acid  Thrombocytopenia: Received 2 units platelet, hemoglobin still trending down at 28, discussed with hematology Dr. Parke Poisson, is most likely in the setting of alcohol toxicity, no evidence of hemolysis.  At this point we will hold on further platelet transfusion unless platelet <20 K, or he has evidence of active bleed.   Seizure: Patient reportedly had a seizure episode while at work prior to presentation.  Concern is for alcohol withdrawal seizure.  -EEG with no evidence of active seizures     Lactic acidosis: Lactic acid 7.5.  He has not been hypotensive.  Suspect this is secondary to seizure activity. . As well due to poor perfusion from anemia c -Continue to trend lactic acid .   Alcohol associated cirrhosis: No evidence of decompensated cirrhosis or hepatic encephalopathy.  GI following.  Hypokalemia Hypomagnesemia -Repleted, monitor closely   DVT prophylaxis: SCD Code Status: Full Family Communication: Discussed with wife by phone 6/15 Disposition:   Status is:  Inpatient    Consultants:  GI  Subjective:  No significant events overnight as discussed with staff, he denies any complaints today. Objective: Vitals:   12/16/22 1924 12/16/22 2137 12/17/22 0000 12/17/22 0321  BP: 119/81 (!) 141/90 119/87 109/67  Pulse: 81  73 70  Resp: 20  18 12   Temp: 98.8 F (37.1 C)   98.1 F (36.7 C) 98 F (36.7 C)  TempSrc: Oral  Oral Oral  SpO2: 97%  95% 99%  Weight:      Height:        Intake/Output Summary (Last 24 hours) at 12/17/2022 1042 Last data filed at 12/16/2022 1924 Gross per 24 hour  Intake --  Output 150 ml  Net -150 ml   Filed Weights   12/12/22 2339  Weight: 85.1 kg    Examination:   Awake Alert, he is oriented x 3 today, but remains slightly confused, restless. Symmetrical Chest wall movement, Good air movement bilaterally, CTAB RRR,No Gallops,Rubs or new Murmurs, No Parasternal Heave +ve B.Sounds, Abd Soft, No tenderness, No rebound - guarding or rigidity. No Cyanosis, Clubbing or edema, No new Rash or bruise         Data Reviewed: I have personally reviewed following labs and imaging studies  CBC: Recent Labs  Lab 12/13/22 0603 12/13/22 2141 12/14/22 0703 12/14/22 1429 12/14/22 2144 12/15/22 0353 12/16/22 0233 12/17/22 0336  WBC 7.7  --  8.5  --   --  10.9* 7.5 7.8  NEUTROABS 5.7  --  6.5  --   --  9.3* 5.5 4.8  HGB 6.0*   < > 9.6* 10.1* 10.1* 10.9* 10.2* 9.3*  HCT 18.0*   < > 28.1* 29.3* 29.3* 31.7* 30.0* 27.5*  MCV 83.3  --  81.0  --   --  84.5 85.2 84.4  PLT 38*  --  43*  --   --  41* 29* 28*   < > = values in this interval not displayed.    Basic Metabolic Panel: Recent Labs  Lab 12/13/22 0603 12/14/22 0703 12/15/22 0353 12/16/22 0233 12/17/22 0336  NA 135 135 133* 131* 131*  K 3.6 3.0* 3.3* 3.9 3.8  CL 105 101 100 102 101  CO2 16* 21* 20* 19* 22  GLUCOSE 94 115* 117* 122* 127*  BUN 6 6 <5* 6 8  CREATININE 0.95 0.80 0.87 0.86 0.80  CALCIUM 7.6* 8.1* 8.3* 8.4* 8.7*  MG 1.7 1.4* 1.9 1.7 1.7  PHOS  --  3.0 2.8 3.3 4.3    GFR: Estimated Creatinine Clearance: 104.6 mL/min (by C-G formula based on SCr of 0.8 mg/dL).  Liver Function Tests: Recent Labs  Lab 12/13/22 0603 12/14/22 0703 12/15/22 0353 12/16/22 0233 12/17/22 0336  AST 133* 110* 118* 106* 116*  ALT 38 37 39 35 41  ALKPHOS 27* 31*  37* 33* 30*  BILITOT 1.9* 2.0* 1.6* 1.1 0.9  PROT 6.1* 6.8 7.5 6.5 6.3*  ALBUMIN 3.0* 3.2* 3.6 3.0* 2.9*    CBG: No results for input(s): "GLUCAP" in the last 168 hours.   No results found for this or any previous visit (from the past 240 hour(s)).       Radiology Studies: No results found.      Scheduled Meds:  sodium chloride   Intravenous Once   sodium chloride   Intravenous Once   chlordiazePOXIDE  10 mg Oral QID   folic acid  1 mg Oral Daily   furosemide  20 mg Intravenous Once   multivitamin  with minerals  1 tablet Oral Daily   pantoprazole  40 mg Oral BID   sodium chloride flush  3 mL Intravenous Q12H   thiamine  100 mg Oral Daily   Or   thiamine  100 mg Intravenous Daily   Continuous Infusions:  magnesium sulfate bolus IVPB     thiamine (VITAMIN B1) injection 500 mg (12/16/22 0923)     LOS: 5 days        Huey Bienenstock, MD [facility]   To contact the attending provider between 7A-7P or the covering provider during after hours 7P-7A, please log into the web site [email or web address] and access using universal [place] password for that web site. If you do not have the password, please call the hospital operator.  12/17/2022, 10:42 AM

## 2022-12-17 NOTE — Plan of Care (Signed)

## 2022-12-18 ENCOUNTER — Other Ambulatory Visit: Payer: Self-pay

## 2022-12-18 ENCOUNTER — Telehealth: Payer: Self-pay

## 2022-12-18 DIAGNOSIS — D696 Thrombocytopenia, unspecified: Secondary | ICD-10-CM

## 2022-12-18 DIAGNOSIS — K852 Alcohol induced acute pancreatitis without necrosis or infection: Secondary | ICD-10-CM

## 2022-12-18 LAB — CBC WITH DIFFERENTIAL/PLATELET
Abs Immature Granulocytes: 0.03 10*3/uL (ref 0.00–0.07)
Basophils Absolute: 0.1 10*3/uL (ref 0.0–0.1)
Basophils Relative: 1 %
Eosinophils Absolute: 0.2 10*3/uL (ref 0.0–0.5)
Eosinophils Relative: 2 %
HCT: 28.8 % — ABNORMAL LOW (ref 39.0–52.0)
Hemoglobin: 9.8 g/dL — ABNORMAL LOW (ref 13.0–17.0)
Immature Granulocytes: 0 %
Lymphocytes Relative: 16 %
Lymphs Abs: 1.4 10*3/uL (ref 0.7–4.0)
MCH: 29.3 pg (ref 26.0–34.0)
MCHC: 34 g/dL (ref 30.0–36.0)
MCV: 86.2 fL (ref 80.0–100.0)
Monocytes Absolute: 2.2 10*3/uL — ABNORMAL HIGH (ref 0.1–1.0)
Monocytes Relative: 24 %
Neutro Abs: 5.1 10*3/uL (ref 1.7–7.7)
Neutrophils Relative %: 57 %
Platelets: 35 10*3/uL — ABNORMAL LOW (ref 150–400)
RBC: 3.34 MIL/uL — ABNORMAL LOW (ref 4.22–5.81)
RDW: 17.7 % — ABNORMAL HIGH (ref 11.5–15.5)
WBC: 9 10*3/uL (ref 4.0–10.5)
nRBC: 0 % (ref 0.0–0.2)

## 2022-12-18 LAB — COMPREHENSIVE METABOLIC PANEL
ALT: 42 U/L (ref 0–44)
AST: 87 U/L — ABNORMAL HIGH (ref 15–41)
Albumin: 3.1 g/dL — ABNORMAL LOW (ref 3.5–5.0)
Alkaline Phosphatase: 34 U/L — ABNORMAL LOW (ref 38–126)
Anion gap: 11 (ref 5–15)
BUN: 6 mg/dL (ref 6–20)
CO2: 21 mmol/L — ABNORMAL LOW (ref 22–32)
Calcium: 9 mg/dL (ref 8.9–10.3)
Chloride: 99 mmol/L (ref 98–111)
Creatinine, Ser: 0.88 mg/dL (ref 0.61–1.24)
GFR, Estimated: >60 mL/min (ref >60)
Glucose, Bld: 126 mg/dL — ABNORMAL HIGH (ref 70–99)
Potassium: 3.3 mmol/L — ABNORMAL LOW (ref 3.5–5.1)
Sodium: 131 mmol/L — ABNORMAL LOW (ref 135–145)
Total Bilirubin: 0.8 mg/dL (ref 0.3–1.2)
Total Protein: 7.2 g/dL (ref 6.5–8.1)

## 2022-12-18 LAB — PHOSPHORUS: Phosphorus: 5 mg/dL — ABNORMAL HIGH (ref 2.5–4.6)

## 2022-12-18 LAB — MAGNESIUM: Magnesium: 1.6 mg/dL — ABNORMAL LOW (ref 1.7–2.4)

## 2022-12-18 MED ORDER — MAGNESIUM SULFATE 4 GM/100ML IV SOLN
4.0000 g | Freq: Once | INTRAVENOUS | Status: AC
  Administered 2022-12-18: 4 g via INTRAVENOUS
  Filled 2022-12-18: qty 100

## 2022-12-18 MED ORDER — POTASSIUM CHLORIDE CRYS ER 20 MEQ PO TBCR
40.0000 meq | EXTENDED_RELEASE_TABLET | ORAL | Status: AC
  Administered 2022-12-18 (×2): 40 meq via ORAL
  Filled 2022-12-18: qty 2

## 2022-12-18 MED ORDER — CHLORDIAZEPOXIDE HCL 5 MG PO CAPS
10.0000 mg | ORAL_CAPSULE | Freq: Three times a day (TID) | ORAL | Status: DC
  Administered 2022-12-18 – 2022-12-19 (×3): 10 mg via ORAL
  Filled 2022-12-18 (×3): qty 2

## 2022-12-18 NOTE — Evaluation (Signed)
Occupational Therapy Evaluation Patient Details Name: Stephen Bowers MRN: [id number] DOB: Nov 17, 1960 Today's Date: 12/18/2022   History of Present Illness Stephen Bowers is a 62 y.o. male who presented 12/12/22 to the ED for evaluation of hematochezia and seizure. CTA negative for GI bleed 6/11. With medical history significant for alcohol use disorder, alcohol associated fatty liver, history of GI bleed from duodenal ulcer, HTN, HLD, pancreatitis.   Clinical Impression   This 62 yo male gainfully employed presents to acute OT with PLOF of being totally independent with basic ADLs, does some of ADLs, drives, and works as a Arboriculturist. He currently is setup/S-min A for basic ADLs with decreased safety awareness, decreased proprioception when completing a task in standing, decreased attention, and decreased balance all affecting basic ADLs, IADLs, and his employment. He will continue to benefit from acute OT with follow up from intensive inpatient follow up therapy, >3 hours/day to get him back to his home and work life in the most efficient and quickest route.       Recommendations for follow up therapy are one component of a multi-disciplinary discharge planning process, led by the attending physician.  Recommendations may be updated based on patient status, additional functional criteria and insurance authorization.   Assistance Recommended at Discharge Frequent or constant Supervision/Assistance  Patient can return home with the following A little help with walking and/or transfers;A little help with bathing/dressing/bathroom;Assistance with cooking/housework;Assist for transportation;Direct supervision/assist for financial management;Direct supervision/assist for medications management    Functional Status Assessment  Patient has had a recent decline in their functional status and demonstrates the ability to make significant improvements in function in a reasonable and predictable amount of  time.  Equipment Recommendations  None recommended by OT       Precautions / Restrictions Precautions Precautions: Fall Restrictions Weight Bearing Restrictions: No      Mobility Bed Mobility               General bed mobility comments: Pt up in recliner upon my arrival    Transfers Overall transfer level: Needs assistance Equipment used: Rolling walker (2 wheels) Transfers: Sit to/from Stand Sit to Stand: Min assist                  Balance Overall balance assessment: Needs assistance Sitting-balance support: No upper extremity supported, Feet supported Sitting balance-Leahy Scale: Good     Standing balance support: Single extremity supported, During functional activity Standing balance-Leahy Scale: Poor                             ADL either performed or assessed with clinical judgement   ADL Overall ADL's : Needs assistance/impaired Eating/Feeding: Independent;Sitting   Grooming: Moderate assistance;Standing;Oral care Grooming Details (indicate cue type and reason): Having difficulty getting toothpaste on toothbrush (undershooting) as he kept trying he kept bending more and more foreward at waist. I had to finally A him with getting toothpaste on toothbrush because he was emptying the toothpaste tube on the counter with toothbrush above it. Once assisted him with this he continued to stay in this stooped posture until I cued him to stand more upright. Upper Body Bathing: Set up;Supervision/ safety;Sitting   Lower Body Bathing: Minimal assistance;Sit to/from stand   Upper Body Dressing : Set up;Supervision/safety;Sitting   Lower Body Dressing: Min guard;Minimal assistance;Sit to/from stand   Toilet Transfer: Minimal assistance;Ambulation;Rolling walker (2 wheels) Toilet Transfer Details (indicate cue type and reason):  simulated recliner, sink to brush teeth, back to recliner Toileting- Clothing Manipulation and Hygiene: Moderate  assistance Toileting - Clothing Manipulation Details (indicate cue type and reason): min A sit<>stand             Vision Baseline Vision/History: 1 Wears glasses (reading) Ability to See in Adequate Light: 0 Adequate Patient Visual Report: No change from baseline Vision Assessment?: Yes Eye Alignment: Within Functional Limits Ocular Range of Motion: Within Functional Limits Alignment/Gaze Preference: Within Defined Limits Tracking/Visual Pursuits:  (eye lids are "droopy" so it is hard to tell if he is actually looing up or not at times) Saccades: Additional eye shifts occurred during testing (needed VCs to continue to look back and forth between the 2 colored pens) Visual Fields: No apparent deficits Additional Comments: Does not report double vision but does not directly touch the pen with either hand in different quadrants, off minimally. Had a hard time putting toothpaste on toothbrush (undershooting it). Able to locate all items on tray table in front of him (did reach for toothpaste instead of toothbrush when first asked)            Pertinent Vitals/Pain Pain Assessment Pain Assessment: No/denies pain     Hand Dominance Right   Extremity/Trunk Assessment Upper Extremity Assessment Upper Extremity Assessment: Generalized weakness (decreased AROM shoulder flexion both UEs)           Communication Communication Communication: No difficulties   Cognition Arousal/Alertness: Awake/alert Behavior During Therapy: WFL for tasks assessed/performed Overall Cognitive Status: Impaired/Different from baseline                                 General Comments: Slowness to respond to questions at times as well as repeating questions prior to answering them at time. A & O x4. Today when asked if he felt like if the Doctor released him today he could go back to work tomorrow he said no--which I would agree with.                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      Prior Functioning/Environment Prior Level of Function : Independent/Modified Independent;Driving             Mobility Comments: Pt reports he is on his feet throughout the day with custodial work, independent.          OT Problem List: Decreased strength;Decreased range of motion;Impaired balance (sitting and/or standing);Impaired vision/perception      OT Treatment/Interventions: Self-care/ADL training;DME and/or AE instruction;Patient/family education;Balance training    OT Goals(Current goals can be found in the care plan section) Acute Rehab OT Goals Patient Stated Goal: to be able to go back to work sooner than later OT Goal Formulation: With patient Time For Goal Achievement: 01/01/23 Potential to Achieve Goals: Good  OT Frequency: Min 1X/week       AM-PAC OT "6 Clicks" Daily Activity     Outcome Measure Help from another person eating meals?: None Help from another  person taking care of personal grooming?: A Little Help from another person toileting, which includes using toliet, bedpan, or urinal?: A Little Help from another person bathing (including washing, rinsing, drying)?: A Little Help from another person to put on and taking off regular upper body clothing?: A Little Help from another person to put on and taking off regular lower body clothing?: A Little 6 Click Score: 19   End of Session Equipment Utilized During Treatment: Gait  belt;Rolling walker (2 wheels)  Activity Tolerance: Patient tolerated treatment well Patient left: in chair;with call bell/phone within reach;with chair alarm set  OT Visit Diagnosis: Unsteadiness on feet (R26.81);Other abnormalities of gait and mobility (R26.89);Low vision, both eyes (H54.2)                Time: 1110-1147 OT Time Calculation (min): 37 min Charges:  OT General Charges $OT Visit: 1 Visit OT Evaluation $OT Eval Moderate Complexity: 1 Mod OT Treatments $Self Care/Home Management : 8-22 mins  Lindon Romp OT Acute Rehabilitation Services Office [phone]    Evette Georges 12/18/2022, 1:10 PM

## 2022-12-18 NOTE — Telephone Encounter (Signed)
-----   Message from Shellia Cleverly, DO sent at 12/14/2022  3:26 PM EDT ----- This is a Dr. [name] patient.  I anticipate this patient will be discharged in the next 24 hours or so.  Can you please arrange for the following:  -CBC check in 7-10 days after hospital discharge -Follow-up in the GI clinic with Dr. [name] or one of the APPs in 4-8 weeks - Will likely need outpatient colonoscopy in 8+ weeks  Thanks!

## 2022-12-18 NOTE — Progress Notes (Signed)
Inpatient Rehab Admissions Coordinator:   Per therapy recommendations pt was screened for CIR by Estill Dooms, PT, D PT.  At this time we are recommending a CIR consult and I will place an order per our protocol.  Note pt progressing rapidly and may not require a 3-day CIR stay.  Will f/u for full assessment tomorrow by one of our ACs.   Estill Dooms, PT, DPT Admissions Coordinator [phone] 12/18/22  5:32 PM

## 2022-12-18 NOTE — Telephone Encounter (Signed)
Order and reminder in epic for lab draw. Pt scheduled to see Dr. [name] 03/19/23 at 1:50pm. Appt letter mailed to pt.

## 2022-12-18 NOTE — TOC Progression Note (Addendum)
Transition of Care [facility]) - Progression Note    Patient Details  Name: Stephen Bowers MRN: [id number] Date of Birth: Dec 16, 1960  [facility] CM/SW Contact  Mearl Latin, LCSW Phone Number: 12/18/2022, 1:01 PM  Clinical Narrative:    1pm-CSW spoke with patient's spouse regarding discharge plan. She reported that patient has [employer] and that she will find the card info and call CSW back. CSW can send that info to CIR if patient is approved for their program. CSW explained to spouse that SNF search can also be conducted and cost will depend on patient's plan. She reported understanding.    2pm-Spouse contacted CSW back with the following insurance info: Rosann Auerbach, ID# [id number], plan# [id number].  Expected Discharge Plan: IP Rehab Facility Barriers to Discharge: Continued Medical Work up, Conservator, museum/gallery and Services In-house Referral: Clinical Social Work   Post Acute Care Choice: Skilled Nursing Facility, IP Rehab Living arrangements for the past 2 months: Single Family Home                                       Social Determinants of Health (SDOH) Interventions SDOH Screenings   Food Insecurity: No Food Insecurity (12/12/2022)  Housing: Low Risk  (12/12/2022)  Transportation Needs: No Transportation Needs (12/12/2022)  Utilities: Not At Risk (12/12/2022)  Depression (PHQ2-9): Low Risk  (05/07/2020)  Tobacco Use: Medium Risk (12/12/2022)    Readmission Risk Interventions     No data to display

## 2022-12-18 NOTE — Progress Notes (Signed)
PROGRESS NOTE        PATIENT DETAILS Name: Stephen Bowers Age: 62 y.o. Sex: male Date of Birth: [birth year] Admit Date: 12/12/2022 Admitting Physician Charlsie Quest, MD ZOX:WRUEAVW, Binnie Rail, MD  Brief Summary: Patient is a 62 y.o.  male with history of EtOH use, HTN, HLD-who presented with lower GI bleeding with acute blood loss anemia-and alcohol withdrawal seizures.  Hospital course complicated by delirium tremens and severe thrombocytopenia related to alcohol use.  Significant events: 6/11>> admit to [facility]  Significant studies: 6/11>> CT angio GI bleed: No localized site of GI bleeding. 6/12>> EEG: No seizures  Significant microbiology data: None  Procedures: None   Consults: GI  Subjective: Lying comfortably in bed-denies any chest pain or shortness of breath.  Objective: Vitals: Blood pressure (!) 139/93, pulse 91, temperature 98.7 F (37.1 C), temperature source Oral, resp. rate 15, height 5\' 11"  (1.803 m), weight 85.1 kg, SpO2 97 %.   Exam: Gen Exam:Alert awake-not in any distress HEENT:atraumatic, normocephalic Chest: B/L clear to auscultation anteriorly CVS:S1S2 regular Abdomen:soft non tender, non distended Extremities:no edema Neurology: Non focal Skin: no rash  Pertinent Labs/Radiology:    Latest Ref Rng & Units 12/18/2022    3:31 AM 12/17/2022    3:36 AM 12/16/2022    2:33 AM  CBC  WBC 4.0 - 10.5 K/uL 9.0  7.8  7.5   Hemoglobin 13.0 - 17.0 g/dL 9.8  9.3  09.8   Hematocrit 39.0 - 52.0 % 28.8  27.5  30.0   Platelets 150 - 400 K/uL 35  28  29     Lab Results  Component Value Date   NA 131 (L) 12/18/2022   K 3.3 (L) 12/18/2022   CL 99 12/18/2022   CO2 21 (L) 12/18/2022      Assessment/Plan: Lower GI bleeding with acute blood loss anemia Resolved Required a total of 5 units of PRBC Hb now stable GI has signed off.  Thrombocytopenia Thought to be due to alcohol related (prior MD Dr. Randol Kern discussed with  hematology/oncology Dr. 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   Weight as of this encounter: 85.1 kg.   Code status:   Code Status: Full Code   DVT Prophylaxis: SCDs Start: 12/12/22 1924   Family Communication: None at bedside   Disposition Plan: Status is: Inpatient Remains inpatient appropriate because: Severity of illness   Planned Discharge Destination:Home   Diet: Diet Order             DIET SOFT Room service appropriate? No; Fluid consistency: Thin  Diet effective now                     Antimicrobial agents: Anti-infectives (From admission, onward)    None        MEDICATIONS: Scheduled Meds:  chlordiazePOXIDE  10 mg Oral QID   folic acid  1 mg Oral Daily   multivitamin with minerals  1 tablet Oral Daily   pantoprazole  40 mg Oral BID   potassium chloride  40 mEq Oral Q4H   sodium chloride flush  3 mL Intravenous Q12H   thiamine  100 mg Oral Daily   Or   thiamine  100 mg Intravenous Daily   Continuous Infusions: PRN Meds:.acetaminophen **OR** acetaminophen, LORazepam **OR** LORazepam, ondansetron **OR** ondansetron (ZOFRAN) IV, senna-docusate   I have personally reviewed following labs and imaging studies  LABORATORY DATA: CBC: Recent Labs  Lab 12/14/22 0703 12/14/22 1429 12/14/22 2144 12/15/22 0353 12/16/22 0233 12/17/22 0336 12/18/22 0331  WBC 8.5  --   --  10.9* 7.5 7.8 9.0   NEUTROABS 6.5  --   --  9.3* 5.5 4.8 5.1  HGB 9.6*   < > 10.1* 10.9* 10.2* 9.3* 9.8*  HCT 28.1*   < > 29.3* 31.7* 30.0* 27.5* 28.8*  MCV 81.0  --   --  84.5 85.2 84.4 86.2  PLT 43*  --   --  41* 29* 28* 35*   < > = values in this interval not displayed.    Basic Metabolic Panel: Recent Labs  Lab 12/14/22 0703 12/15/22 0353 12/16/22 0233 12/17/22 0336 12/18/22 0331  NA 135 133* 131* 131* 131*  K 3.0* 3.3* 3.9 3.8 3.3*  CL 101 100 102 101 99  CO2 21* 20* 19* 22 21*  GLUCOSE 115* 117* 122* 127* 126*  BUN 6 <5* 6 8 6   CREATININE 0.80 0.87 0.86 0.80 0.88  CALCIUM 8.1* 8.3* 8.4* 8.7* 9.0  MG 1.4* 1.9 1.7 1.7 1.6*  PHOS 3.0 2.8 3.3 4.3 5.0*    GFR: Estimated Creatinine Clearance: 95.1 mL/min (by C-G formula based on SCr of 0.88 mg/dL).  Liver Function Tests: Recent Labs  Lab 12/14/22 0703 12/15/22 0353 12/16/22 0233 12/17/22 0336 12/18/22 0331  AST 110* 118* 106* 116* 87*  ALT 37 39 35 41 42  ALKPHOS 31* 37* 33* 30* 34*  BILITOT 2.0* 1.6* 1.1 0.9 0.8  PROT 6.8 7.5 6.5 6.3* 7.2  ALBUMIN 3.2* 3.6 3.0* 2.9* 3.1*   Recent Labs  Lab 12/12/22 1352  LIPASE 71*   No results for input(s): "AMMONIA" in the last 168 hours.  Coagulation Profile: Recent Labs  Lab 12/12/22 1352 12/13/22 0603  INR 1.4* 1.4*    Cardiac Enzymes: No results for input(s): "CKTOTAL", "CKMB", "CKMBINDEX", "TROPONINI" in the last 168 hours.  BNP (last 3 results) No results for input(s): "PROBNP" in the last 8760 hours.  Lipid Profile: No results for input(s): "CHOL", "HDL", "LDLCALC", "TRIG", "CHOLHDL", "LDLDIRECT" in the last 72 hours.  Thyroid Function Tests: No results for input(s): "TSH", "T4TOTAL", "FREET4", "T3FREE", "THYROIDAB" in the last 72 hours.  Anemia Panel: No results for input(s): "VITAMINB12", "FOLATE", "FERRITIN", "TIBC", "IRON", "RETICCTPCT" in the last 72 hours.  Urine analysis:    Component Value Date/Time   COLORURINE YELLOW 05/22/2022 0449   APPEARANCEUR  CLEAR 05/22/2022 0449   LABSPEC 1.038 (H) 05/22/2022 0449   PHURINE 5.0 05/22/2022 0449   GLUCOSEU NEGATIVE 05/22/2022 0449   HGBUR SMALL (A) 05/22/2022 0449   BILIRUBINUR NEGATIVE 05/22/2022 0449   BILIRUBINUR small (A) 11/22/2018 0906   BILIRUBINUR neg 08/02/2015 1039   KETONESUR NEGATIVE 05/22/2022 0449   PROTEINUR NEGATIVE 05/22/2022 0449   UROBILINOGEN 1.0 11/22/2018 0906   UROBILINOGEN 0.2 06/19/2014 1556   NITRITE NEGATIVE 05/22/2022 0449   LEUKOCYTESUR TRACE (A) 05/22/2022 0449    Sepsis Labs:  Lactic Acid, Venous    Component Value Date/Time   LATICACIDVEN 4.0 (HH) 12/12/2022 2022    MICROBIOLOGY: No results found for this or any previous visit (from the past 240 hour(s)).  RADIOLOGY STUDIES/RESULTS: No results found.   LOS: 6 days   Jeoffrey Massed, MD  [facility]    To contact the attending provider between 7A-7P or the covering provider during after hours 7P-7A, please log into the web site [email or web address] and access using universal [place] password for that web site. If you do not have the password, please call the hospital operator.  12/18/2022, 11:09 AM

## 2022-12-18 NOTE — Progress Notes (Signed)
Physical Therapy Treatment Patient Details Name: Stephen Bowers MRN: [id number] DOB: Jan 27, 1961 Today's Date: 12/18/2022   History of Present Illness Stephen Bowers is a 62 y.o. male who presented 12/12/22 to the ED for evaluation of hematochezia and seizure. CTA negative for GI bleed 6/11. With medical history significant for alcohol use disorder, alcohol associated fatty liver, history of GI bleed from duodenal ulcer, HTN, HLD, pancreatitis.    PT Comments    Pt greeted resting in bed on arrival and agreeable to session. Pt continues to be limited by decreased activity tolerance, impaired balance/postural reactions, poor proprioceptive and safety awareness. Pt requiring grossly min assist for transfers and gait with RW support with pt demonstrating poor environmental awareness on R needing max cues and hands on assist to negotiate all obstacles on R. Discussed with supervising PT and update recommendations, >3 hours/day to address deficits and maximize functional independence to return to home and work. Will continue to follow acutely.    Recommendations for follow up therapy are one component of a multi-disciplinary discharge planning process, led by the attending physician.  Recommendations may be updated based on patient status, additional functional criteria and insurance authorization.  Follow Up Recommendations       Assistance Recommended at Discharge PRN  Patient can return home with the following A little help with walking and/or transfers;A little help with bathing/dressing/bathroom;Assistance with cooking/housework;Assist for transportation;Help with stairs or ramp for entrance   Equipment Recommendations  Other (comment) (Pending progress)    Recommendations for Other Services Rehab consult     Precautions / Restrictions Precautions Precautions: Fall Restrictions Weight Bearing Restrictions: No     Mobility  Bed Mobility Overal bed mobility: Modified  Independent Bed Mobility: Supine to Sit     Supine to sit: Modified independent (Device/Increase time)     General bed mobility comments: increased time and use of bed features    Transfers Overall transfer level: Needs assistance Equipment used: Rolling walker (2 wheels) Transfers: Sit to/from Stand Sit to Stand: Min assist           General transfer comment: cues for hand placement on rise and min A to steady    Ambulation/Gait Ambulation/Gait assistance: Min Chemical engineer (Feet): 160 Feet Assistive device: Rolling walker (2 wheels) Gait Pattern/deviations: Step-through pattern, Drifts right/left, Decreased stride length Gait velocity: reduced     General Gait Details: pt needing min A to steady and manage RW, pt with R lateral lean needing cues for equal weight distribution through UEs, pt running into objects on R, when prompting pt to anticipate obstacle coming up pt moving RW but leaving feet outisde RW on R.   Stairs             Wheelchair Mobility    Modified Rankin (Stroke Patients Only)       Balance Overall balance assessment: Needs assistance Sitting-balance support: No upper extremity supported, Feet supported Sitting balance-Leahy Scale: Good     Standing balance support: Single extremity supported, During functional activity Standing balance-Leahy Scale: Poor Standing balance comment: min guard to min A with UE support                            Cognition Arousal/Alertness: Awake/alert Behavior During Therapy: WFL for tasks assessed/performed Overall Cognitive Status: Impaired/Different from baseline Area of Impairment: Safety/judgement, Awareness  Safety/Judgement: Decreased awareness of deficits Awareness: Emergent   General Comments: Slowness to respond to questions at times. A & O x4.        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    Prior Function            PT Goals (current goals can now be found in the care plan section) Acute Rehab PT Goals PT Goal Formulation: With patient Time For Goal Achievement: 12/31/22 Progress towards PT goals: Progressing toward goals    Frequency    Min 3X/week      PT Plan Discharge plan needs to be updated    Co-evaluation              AM-PAC PT "6 Clicks" Mobility   Outcome Measure  Help needed turning from your back to your side while in a flat bed without using bedrails?: A Little Help needed moving from lying on your back to sitting on the side of a flat bed without using bedrails?: A Little Help needed moving to and from a bed to a chair (including a wheelchair)?: A Little Help needed standing up from a chair using your arms (e.g., wheelchair or bedside chair)?: A Little Help needed to walk in hospital room?: A Little Help needed climbing 3-5 steps with a railing? : A Lot 6 Click Score: 17    End of Session Equipment Utilized During Treatment: Gait belt Activity Tolerance: Patient tolerated treatment well Patient left: in chair;with call bell/phone within reach;with chair alarm set Nurse Communication: Mobility status PT Visit Diagnosis: Unsteadiness on feet (R26.81);Other abnormalities of gait and mobility (R26.89)     Time: 1610-9604 PT Time Calculation (min) (ACUTE ONLY): 26 min  Charges:  $Gait Training: 8-22 mins $Therapeutic Activity: 8-22 mins                      Anabia Weatherwax R. PTA Acute Rehabilitation Services Office: [phone]   Catalina Antigua 12/18/2022, 3:50 PM

## 2022-12-19 DIAGNOSIS — K922 Gastrointestinal hemorrhage, unspecified: Secondary | ICD-10-CM

## 2022-12-19 LAB — COMPREHENSIVE METABOLIC PANEL
ALT: 37 U/L (ref 0–44)
AST: 77 U/L — ABNORMAL HIGH (ref 15–41)
Albumin: 3 g/dL — ABNORMAL LOW (ref 3.5–5.0)
Alkaline Phosphatase: 32 U/L — ABNORMAL LOW (ref 38–126)
Anion gap: 11 (ref 5–15)
BUN: 7 mg/dL (ref 6–20)
CO2: 19 mmol/L — ABNORMAL LOW (ref 22–32)
Calcium: 8.7 mg/dL — ABNORMAL LOW (ref 8.9–10.3)
Chloride: 102 mmol/L (ref 98–111)
Creatinine, Ser: 0.85 mg/dL (ref 0.61–1.24)
GFR, Estimated: >60 mL/min (ref >60)
Glucose, Bld: 121 mg/dL — ABNORMAL HIGH (ref 70–99)
Potassium: 3.8 mmol/L (ref 3.5–5.1)
Sodium: 132 mmol/L — ABNORMAL LOW (ref 135–145)
Total Bilirubin: 0.7 mg/dL (ref 0.3–1.2)
Total Protein: 7 g/dL (ref 6.5–8.1)

## 2022-12-19 LAB — CBC
HCT: 29.3 % — ABNORMAL LOW (ref 39.0–52.0)
Hemoglobin: 9.8 g/dL — ABNORMAL LOW (ref 13.0–17.0)
MCH: 29 pg (ref 26.0–34.0)
MCHC: 33.4 g/dL (ref 30.0–36.0)
MCV: 86.7 fL (ref 80.0–100.0)
Platelets: 64 10*3/uL — ABNORMAL LOW (ref 150–400)
RBC: 3.38 MIL/uL — ABNORMAL LOW (ref 4.22–5.81)
RDW: 18.3 % — ABNORMAL HIGH (ref 11.5–15.5)
WBC: 9.3 10*3/uL (ref 4.0–10.5)
nRBC: 0 % (ref 0.0–0.2)

## 2022-12-19 LAB — MAGNESIUM: Magnesium: 1.8 mg/dL (ref 1.7–2.4)

## 2022-12-19 MED ORDER — MAGNESIUM SULFATE 2 GM/50ML IV SOLN
2.0000 g | Freq: Once | INTRAVENOUS | Status: AC
  Administered 2022-12-19: 2 g via INTRAVENOUS
  Filled 2022-12-19: qty 50

## 2022-12-19 MED ORDER — POTASSIUM CHLORIDE CRYS ER 20 MEQ PO TBCR
20.0000 meq | EXTENDED_RELEASE_TABLET | Freq: Once | ORAL | Status: AC
  Administered 2022-12-19: 20 meq via ORAL
  Filled 2022-12-19: qty 1

## 2022-12-19 MED ORDER — CHLORDIAZEPOXIDE HCL 5 MG PO CAPS
5.0000 mg | ORAL_CAPSULE | Freq: Three times a day (TID) | ORAL | Status: AC
  Administered 2022-12-19 – 2022-12-20 (×3): 5 mg via ORAL
  Filled 2022-12-19 (×3): qty 1

## 2022-12-19 NOTE — Progress Notes (Signed)
IP rehab admissions - I met with patient at the bedside.  I explained inpatient rehab.  Patient wants to DC home with [facility] therapies.  He does not want an inpatient rehab stay.  Call me for questions.  [phone]

## 2022-12-19 NOTE — Progress Notes (Signed)
PROGRESS NOTE        PATIENT DETAILS Name: Stephen Bowers Age: 62 y.o. Sex: male Date of Birth: [birth year] Admit Date: 12/12/2022 Admitting Physician Charlsie Quest, MD ZOX:WRUEAVW, Binnie Rail, MD  Brief Summary: Patient is a 62 y.o.  male with history of EtOH use, HTN, HLD-who presented with lower GI bleeding with acute blood loss anemia-and alcohol withdrawal seizures.  Hospital course complicated by delirium tremens and severe thrombocytopenia related to alcohol use.  Significant events: 6/11>> admit to [facility]  Significant studies: 6/11>> CT angio GI bleed: No localized site of GI bleeding. 6/12>> EEG: No seizures  Significant microbiology data: None  Procedures: None   Consults: GI  Subjective: Completely awake and alert-wants to go home-[place] at the beach.  Objective: Vitals: Blood pressure 130/87, pulse 89, temperature 98.4 F (36.9 C), temperature source Oral, resp. rate 13, height 5\' 11"  (1.803 m), weight 85.1 kg, SpO2 100 %.   Exam: Gen Exam:Alert awake-not in any distress HEENT:atraumatic, normocephalic Chest: B/L clear to auscultation anteriorly CVS:S1S2 regular Abdomen:soft non tender, non distended Extremities:no edema Neurology: Non focal Skin: no rash  Pertinent Labs/Radiology:    Latest Ref Rng & Units 12/19/2022    3:36 AM 12/18/2022    3:31 AM 12/17/2022    3:36 AM  CBC  WBC 4.0 - 10.5 K/uL 9.3  9.0  7.8   Hemoglobin 13.0 - 17.0 g/dL 9.8  9.8  9.3   Hematocrit 39.0 - 52.0 % 29.3  28.8  27.5   Platelets 150 - 400 K/uL 64  35  28     Lab Results  Component Value Date   NA 132 (L) 12/19/2022   K 3.8 12/19/2022   CL 102 12/19/2022   CO2 19 (L) 12/19/2022      Assessment/Plan: Lower GI bleeding with acute blood loss anemia Resolved Required a total of 5 units of PRBC Hb now stable GI has signed off.  Thrombocytopenia Thought to be due to alcohol related (prior MD Dr. Randol Kern discussed with  hematology/oncology Dr. Parke Poisson) Platelet count continues to slowly improve.   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   Weight as of this encounter: 85.1 kg.   Code status:   Code Status: Full Code   DVT Prophylaxis: SCDs Start: 12/12/22 1924   Family Communication: None at bedside   Disposition Plan: Status is: Inpatient Remains inpatient appropriate because: Severity of illness   Planned Discharge Destination:Home   Diet: Diet Order             DIET SOFT Room service appropriate? No; Fluid consistency: Thin  Diet effective now                     Antimicrobial agents: Anti-infectives (From admission, onward)    None        MEDICATIONS: Scheduled Meds:  chlordiazePOXIDE  10 mg Oral TID   folic acid  1 mg Oral Daily   multivitamin with  minerals  1 tablet Oral Daily   pantoprazole  40 mg Oral BID   sodium chloride flush  3 mL Intravenous Q12H   thiamine  100 mg Oral Daily   Continuous Infusions: PRN Meds:.acetaminophen **OR** acetaminophen, ondansetron **OR** ondansetron (ZOFRAN) IV, senna-docusate   I have personally reviewed following labs and imaging studies  LABORATORY DATA: CBC: Recent Labs  Lab 12/14/22 0703 12/14/22 1429 12/15/22 0353 12/16/22 0233 12/17/22 0336 12/18/22 0331 12/19/22 0336  WBC 8.5  --  10.9* 7.5 7.8 9.0 9.3  NEUTROABS 6.5  --  9.3* 5.5 4.8 5.1  --   HGB 9.6*   < > 10.9* 10.2* 9.3* 9.8* 9.8*  HCT 28.1*   < > 31.7* 30.0* 27.5* 28.8* 29.3*  MCV 81.0  --  84.5 85.2 84.4  86.2 86.7  PLT 43*  --  41* 29* 28* 35* 64*   < > = values in this interval not displayed.     Basic Metabolic Panel: Recent Labs  Lab 12/14/22 0703 12/15/22 0353 12/16/22 0233 12/17/22 0336 12/18/22 0331 12/19/22 0336  NA 135 133* 131* 131* 131* 132*  K 3.0* 3.3* 3.9 3.8 3.3* 3.8  CL 101 100 102 101 99 102  CO2 21* 20* 19* 22 21* 19*  GLUCOSE 115* 117* 122* 127* 126* 121*  BUN 6 <5* 6 8 6 7   CREATININE 0.80 0.87 0.86 0.80 0.88 0.85  CALCIUM 8.1* 8.3* 8.4* 8.7* 9.0 8.7*  MG 1.4* 1.9 1.7 1.7 1.6* 1.8  PHOS 3.0 2.8 3.3 4.3 5.0*  --      GFR: Estimated Creatinine Clearance: 98.4 mL/min (by C-G formula based on SCr of 0.85 mg/dL).  Liver Function Tests: Recent Labs  Lab 12/15/22 0353 12/16/22 0233 12/17/22 0336 12/18/22 0331 12/19/22 0336  AST 118* 106* 116* 87* 77*  ALT 39 35 41 42 37  ALKPHOS 37* 33* 30* 34* 32*  BILITOT 1.6* 1.1 0.9 0.8 0.7  PROT 7.5 6.5 6.3* 7.2 7.0  ALBUMIN 3.6 3.0* 2.9* 3.1* 3.0*    Recent Labs  Lab 12/12/22 1352  LIPASE 71*    No results for input(s): "AMMONIA" in the last 168 hours.  Coagulation Profile: Recent Labs  Lab 12/12/22 1352 12/13/22 0603  INR 1.4* 1.4*     Cardiac Enzymes: No results for input(s): "CKTOTAL", "CKMB", "CKMBINDEX", "TROPONINI" in the last 168 hours.  BNP (last 3 results) No results for input(s): "PROBNP" in the last 8760 hours.  Lipid Profile: No results for input(s): "CHOL", "HDL", "LDLCALC", "TRIG", "CHOLHDL", "LDLDIRECT" in the last 72 hours.  Thyroid Function Tests: No results for input(s): "TSH", "T4TOTAL", "FREET4", "T3FREE", "THYROIDAB" in the last 72 hours.  Anemia Panel: No results for input(s): "VITAMINB12", "FOLATE", "FERRITIN", "TIBC", "IRON", "RETICCTPCT" in the last 72 hours.  Urine analysis:    Component Value Date/Time   COLORURINE YELLOW 05/22/2022 0449   APPEARANCEUR CLEAR 05/22/2022 0449   LABSPEC 1.038 (H) 05/22/2022 0449   PHURINE 5.0 05/22/2022 0449   GLUCOSEU  NEGATIVE 05/22/2022 0449   HGBUR SMALL (A) 05/22/2022 0449   BILIRUBINUR NEGATIVE 05/22/2022 0449   BILIRUBINUR small (A) 11/22/2018 0906   BILIRUBINUR neg 08/02/2015 1039   KETONESUR NEGATIVE 05/22/2022 0449   PROTEINUR NEGATIVE 05/22/2022 0449   UROBILINOGEN 1.0 11/22/2018 0906   UROBILINOGEN 0.2 06/19/2014 1556   NITRITE NEGATIVE 05/22/2022 0449   LEUKOCYTESUR TRACE (A) 05/22/2022 0449    Sepsis Labs: Lactic Acid, Venous    Component Value Date/Time   LATICACIDVEN 4.0 (HH) 12/12/2022 2022  MICROBIOLOGY: No results found for this or any previous visit (from the past 240 hour(s)).  RADIOLOGY STUDIES/RESULTS: No results found.   LOS: 7 days   Jeoffrey Massed, MD  [facility]    To contact the attending provider between 7A-7P or the covering provider during after hours 7P-7A, please log into the web site [email or web address] and access using universal [place] password for that web site. If you do not have the password, please call the hospital operator.  12/19/2022, 12:19 PM

## 2022-12-19 NOTE — Progress Notes (Signed)
Physical Therapy Treatment Patient Details Name: Stephen Bowers MRN: [id number] DOB: [birth year] Today's Date: 12/19/2022   History of Present Illness Stephen Bowers is a 62 y.o. male who presented 12/12/22 to the ED for evaluation of hematochezia and seizure. CTA negative for GI bleed 6/11. With medical history significant for alcohol use disorder, alcohol associated fatty liver, history of GI bleed from duodenal ulcer, HTN, HLD, pancreatitis.    PT Comments    Pt tolerated today's session well, but continues to require assist for safe mobility. MinA provided for sit<>stand trials to power up and steady once in standing, noted improved recall of hand placement with repetition. Pt continues to require minA for safe ambulation with RW, requiring assist for RW management and increased verbal cues for obstacle negotiation, especially on the R side. Pt with one LOB during today's session requiring assist to correct, as pt with R foot on the outside on the RW leg. Pt will continue to benefit from skilled acute PT to progress safety with mobility and reduce risk of falls. Discharge recommendations remain appropriate as pt will continue to benefit from high intensity PT to maximize independence prior to return home.     Recommendations for follow up therapy are one component of a multi-disciplinary discharge planning process, led by the attending physician.  Recommendations may be updated based on patient status, additional functional criteria and insurance authorization.  Follow Up Recommendations       Assistance Recommended at Discharge Intermittent Supervision/Assistance  Patient can return home with the following A little help with walking and/or transfers;A little help with bathing/dressing/bathroom;Assistance with cooking/housework;Assist for transportation;Help with stairs or ramp for entrance   Equipment Recommendations  Other (comment) (Pending progress)    Recommendations for Other  Services       Precautions / Restrictions Precautions Precautions: Fall Restrictions Weight Bearing Restrictions: No     Mobility  Bed Mobility Overal bed mobility: Needs Assistance Bed Mobility: Supine to Sit, Sit to Supine     Supine to sit: Supervision, HOB elevated Sit to supine: Supervision, HOB elevated   General bed mobility comments: increased time and use of bed rails, supervision for safety and cueing for fully scooting to EOB, as well as line management, pt declined sitting up in chair at end of session, returning to supine with supervision    Transfers Overall transfer level: Needs assistance Equipment used: Rolling walker (2 wheels) Transfers: Sit to/from Stand Sit to Stand: Min assist           General transfer comment: cues for proper hand placement and assist to power up and steady, progressing with proper hand placement with repetition    Ambulation/Gait Ambulation/Gait assistance: Min assist Gait Distance (Feet): 200 Feet Assistive device: Rolling walker (2 wheels) Gait Pattern/deviations: Step-through pattern, Drifts right/left, Decreased stride length, Trunk flexed Gait velocity: decreased     General Gait Details: Min A required for balance and RW management. Pt cued for proper posture and forward gaze, mildly improving obstacle negotiation, however pt intermittently bumping into obstacles on the R side, noted increase in difficulty navigating in narrow spaces, especially with fatiuge. Pt with one LOB requiring minA to correct due having R foot outside of RW leg. Increased cues for processing RW management at bed to safely return to sitting   Stairs             Wheelchair Mobility    Modified Rankin (Stroke Patients Only)       Balance Overall balance assessment:  Needs assistance Sitting-balance support: No upper extremity supported, Feet supported Sitting balance-Leahy Scale: Good     Standing balance support: During functional  activity, Bilateral upper extremity supported Standing balance-Leahy Scale: Poor Standing balance comment: assist and cues for RW management, reliant on BUE for balance with ambulation                            Cognition Arousal/Alertness: Awake/alert Behavior During Therapy: WFL for tasks assessed/performed Overall Cognitive Status: Impaired/Different from baseline Area of Impairment: Safety/judgement, Awareness, Problem solving                         Safety/Judgement: Decreased awareness of deficits, Decreased awareness of safety Awareness: Emergent Problem Solving: Slow processing, Difficulty sequencing, Requires verbal cues General Comments: increased time for processing, difficulty problem solving navigating RW in narrow spaces        Exercises Other Exercises Other Exercises: sit<>stand x5 reps from EOB    General Comments General comments (skin integrity, edema, etc.): HR elevating to 120s with ambulation      Pertinent Vitals/Pain Pain Assessment Pain Assessment: No/denies pain    Home Living                          Prior Function            PT Goals (current goals can now be found in the care plan section) Acute Rehab PT Goals Patient Stated Goal: go home PT Goal Formulation: With patient Time For Goal Achievement: 12/31/22 Potential to Achieve Goals: Good Progress towards PT goals: Progressing toward goals    Frequency    Min 3X/week      PT Plan Current plan remains appropriate    Co-evaluation              AM-PAC PT "6 Clicks" Mobility   Outcome Measure  Help needed turning from your back to your side while in a flat bed without using bedrails?: A Little Help needed moving from lying on your back to sitting on the side of a flat bed without using bedrails?: A Little Help needed moving to and from a bed to a chair (including a wheelchair)?: A Little Help needed standing up from a chair using your arms  (e.g., wheelchair or bedside chair)?: A Little Help needed to walk in hospital room?: A Little Help needed climbing 3-5 steps with a railing? : A Lot 6 Click Score: 17    End of Session Equipment Utilized During Treatment: Gait belt Activity Tolerance: Patient tolerated treatment well Patient left: with call bell/phone within reach;in bed;with bed alarm set Nurse Communication: Mobility status PT Visit Diagnosis: Unsteadiness on feet (R26.81);Other abnormalities of gait and mobility (R26.89)     Time: 1610-9604 PT Time Calculation (min) (ACUTE ONLY): 19 min  Charges:  $Gait Training: 8-22 mins                     Lindalou Hose, PT DPT Acute Rehabilitation Services Office [phone]    Leonie Man 12/19/2022, 10:17 AM

## 2022-12-20 LAB — CBC
HCT: 29.1 % — ABNORMAL LOW (ref 39.0–52.0)
Hemoglobin: 9.5 g/dL — ABNORMAL LOW (ref 13.0–17.0)
MCH: 28.9 pg (ref 26.0–34.0)
MCHC: 32.6 g/dL (ref 30.0–36.0)
MCV: 88.4 fL (ref 80.0–100.0)
Platelets: 125 10*3/uL — ABNORMAL LOW (ref 150–400)
RBC: 3.29 MIL/uL — ABNORMAL LOW (ref 4.22–5.81)
RDW: 18.3 % — ABNORMAL HIGH (ref 11.5–15.5)
WBC: 10 10*3/uL (ref 4.0–10.5)
nRBC: 0 % (ref 0.0–0.2)

## 2022-12-20 NOTE — Progress Notes (Signed)
Occupational Therapy Treatment Patient Details Name: Stephen Bowers MRN: [id number] DOB: Oct 10, 1960 Today's Date: 12/20/2022   History of present illness Stephen Bowers is a 62 y.o. male who presented 12/12/22 to the ED for evaluation of hematochezia and potential seizure while at work (EEG was negative). CTA negative for GI bleed 6/11. With medical history significant for alcohol use disorder, alcohol associated fatty liver, history of GI bleed from duodenal ulcer, HTN, HLD, pancreatitis.   OT comments  This 62 yo male doing better today overall. He did not have any LOB with me in hallway and we did not use an AD--his speed is much slower than his baseline he reports (he works as a Arboriculturist), he was able to get toothpaste on toothbrush today and stand upright at sink to brush his teeth. He will continue to benefit from acute OT with D/C now changed to [facility] as long as he has someone at home with him.   Recommendations for follow up therapy are one component of a multi-disciplinary discharge planning process, led by the attending physician.  Recommendations may be updated based on patient status, additional functional criteria and insurance authorization.    Assistance Recommended at Discharge Intermittent Supervision/Assistance  Patient can return home with the following  A little help with walking and/or transfers;A little help with bathing/dressing/bathroom;Assistance with cooking/housework;Help with stairs or ramp for entrance;Assist for transportation;Direct supervision/assist for financial management;Direct supervision/assist for medications management   Equipment Recommendations  None recommended by OT       Precautions / Restrictions Precautions Precautions: Fall Restrictions Weight Bearing Restrictions: No       Mobility Bed Mobility Overal bed mobility: Independent                  Transfers Overall transfer level: Needs assistance Equipment used:  None Transfers: Sit to/from Stand Sit to Stand: Min guard                 Balance Overall balance assessment: Needs assistance Sitting-balance support: No upper extremity supported, Feet supported Sitting balance-Leahy Scale: Good     Standing balance support: No upper extremity supported Standing balance-Leahy Scale: Fair Standing balance comment: pt able to ambulate around the unit with head turns left, right, up, down and no LOB as well as bend over and pick up a pen off the floor. He was also able to ambulate around all obstacles in the hallways without running into anything.                           ADL either performed or assessed with clinical judgement   ADL Overall ADL's : Needs assistance/impaired     Grooming: Oral care;Min guard;Standing Grooming Details (indicate cue type and reason): standing to brush teeth               Lower Body Dressing Details (indicate cue type and reason): Pt can cross his legs in "figure 4" position to get to socks for taking off and putting them on. Sit<>stand min guard A (close to S) Toilet Transfer: Hydrographic surveyor Details (indicate cue type and reason): simulated bed>out and around unit>to sink to groom>back to bed                Extremity/Trunk Assessment Upper Extremity Assessment Upper Extremity Assessment: Overall WFL for tasks assessed            Vision Baseline Vision/History: 1 Wears glasses (reading) Patient Visual  Report: No change from baseline Additional Comments: Pt was able to stand at the sink today and get the toothpaste on his toothbrush and stand upright to brush his teeth.          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                   Pertinent Vitals/ Pain       Pain Assessment Pain Assessment: No/denies pain         Frequency  Min 1X/week        Progress Toward Goals  OT Goals(current goals can now be found in the care plan section)  Progress towards OT goals: Progressing toward goals  Acute Rehab OT Goals Patient Stated Goal: to be able to leave and go home OT Goal Formulation: With patient Time For Goal Achievement: 01/01/23 Potential to Achieve Goals: Good  Plan Discharge plan needs to be updated       AM-PAC OT "6 Clicks" Daily Activity     Outcome Measure   Help from another person eating meals?: None Help from another person taking care of personal grooming?: A Little Help from another person toileting, which includes using toliet, bedpan, or urinal?: A Little Help from another person bathing (including washing, rinsing, drying)?: A Little Help from another person to put on and taking off regular upper body clothing?: A Little Help from another person to put on and taking off regular lower body clothing?: A Little 6 Click Score: 19    End of Session Equipment Utilized During Treatment: Gait belt;Rolling walker (2 wheels)  OT Visit Diagnosis: Unsteadiness on feet (R26.81);Other abnormalities of gait and mobility (R26.89);Other symptoms and signs involving cognitive function   Activity Tolerance Patient tolerated treatment well   Patient Left in bed;with call bell/phone within reach;with bed alarm set           Time: 1344-1406 OT Time Calculation (min): 22 min  Charges: OT General Charges $OT Visit: 1 Visit OT Treatments $Self Care/Home Management : 8-22  mins Lindon Romp OT Acute Rehabilitation Services Office [phone]   Evette Georges 12/20/2022, 2:29 PM

## 2022-12-20 NOTE — Progress Notes (Signed)
PROGRESS NOTE        PATIENT DETAILS Name: Stephen Bowers Age: 62 y.o. Sex: male Date of Birth: [birth year] Admit Date: 12/12/2022 Admitting Physician Charlsie Quest, MD UJW:[id number], Binnie Rail, MD  Brief Summary: Patient is a 62 y.o.  male with history of EtOH use, HTN, HLD-who presented with lower GI bleeding with acute blood loss anemia-and alcohol withdrawal seizures.  Hospital course complicated by delirium tremens and severe thrombocytopenia related to alcohol use.  Significant events: 6/11>> admit to [facility]  Significant studies: 6/11>> CT angio GI bleed: No localized site of GI bleeding. 6/12>> EEG: No seizures  Significant microbiology data: None  Procedures: None   Consults: GI  Subjective: Completely awake alert-wants to go home but he has no one at home-his wife is unfortunately at the beach.  I spoke with the wife-she does not want him discharged-however she is aware that patient has improved-is awake/alert-and can always leave the hospital if he desires too.  Patient has called the spouse from the hospital to let her know his intentions-she has also spoken to him a couple of times this morning.  Objective: Vitals: Blood pressure 127/64, pulse 75, temperature 98.6 F (37 C), temperature source Oral, resp. rate 13, height 5\' 11"  (1.803 m), weight 85.1 kg, SpO2 98 %.   Exam: Gen Exam:Alert awake-not in any distress HEENT:atraumatic, normocephalic Chest: B/L clear to auscultation anteriorly CVS:S1S2 regular Abdomen:soft non tender, non distended Extremities:no edema Neurology: Non focal Skin: no rash  Pertinent Labs/Radiology:    Latest Ref Rng & Units 12/20/2022    3:10 AM 12/19/2022    3:36 AM 12/18/2022    3:31 AM  CBC  WBC 4.0 - 10.5 K/uL 10.0  9.3  9.0   Hemoglobin 13.0 - 17.0 g/dL 9.5  9.8  9.8   Hematocrit 39.0 - 52.0 % 29.1  29.3  28.8   Platelets 150 - 400 K/uL 125  64  35     Lab Results  Component Value Date   NA 132  (L) 12/19/2022   K 3.8 12/19/2022   CL 102 12/19/2022   CO2 19 (L) 12/19/2022      Assessment/Plan: Lower GI bleeding with acute blood loss anemia Resolved Required a total of 5 units of PRBC Hb now stable GI has signed off.  Thrombocytopenia Thought to be due to alcohol related (prior MD Dr. Randol Kern discussed with hematology/oncology Dr. Parke Poisson) Platelet count continues to slowly improve.   Follow CBC-transfuse if major bleeding or if platelet count <20 K  History of alcohol use Alcohol withdrawal with delirium tremens Alcohol withdrawal seizure Significantly/rapidly improving-no tremors-completely awake and alert-he has called his wife x 2 today to talk to her about him going home. Librium has been tapered off-he should be out of the window for any further withdrawal symptoms. Counseled but unclear to me if he has any intention of quitting.  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  BMI: Estimated body mass index is 26.17 kg/m as calculated from the following:   Height as of this encounter: 5\' 11"  (1.803 m).   Weight as of this encounter: 85.1 kg.   Code status:   Code Status:  Full Code   DVT Prophylaxis: SCDs Start: 12/12/22 1924   Family Communication:    Disposition Plan: Status is: Inpatient Remains inpatient appropriate because: Severity of illness   Planned Discharge Destination:Home   Diet: Diet Order             DIET SOFT Room service appropriate? No; Fluid consistency: Thin  Diet effective now                     Antimicrobial agents: Anti-infectives (From admission, onward)    None        MEDICATIONS: Scheduled Meds:  folic acid  1 mg Oral Daily   multivitamin with minerals  1  tablet Oral Daily   pantoprazole  40 mg Oral BID   sodium chloride flush  3 mL Intravenous Q12H   thiamine  100 mg Oral Daily   Continuous Infusions: PRN Meds:.acetaminophen **OR** acetaminophen, ondansetron **OR** ondansetron (ZOFRAN) IV, senna-docusate   I have personally reviewed following labs and imaging studies  LABORATORY DATA: CBC: Recent Labs  Lab 12/14/22 0703 12/14/22 1429 12/15/22 0353 12/16/22 0233 12/17/22 0336 12/18/22 0331 12/19/22 0336 12/20/22 0310  WBC 8.5  --  10.9* 7.5 7.8 9.0 9.3 10.0  NEUTROABS 6.5  --  9.3* 5.5 4.8 5.1  --   --   HGB 9.6*   < > 10.9* 10.2* 9.3* 9.8* 9.8* 9.5*  HCT 28.1*   < > 31.7* 30.0* 27.5* 28.8* 29.3* 29.1*  MCV 81.0  --  84.5 85.2 84.4 86.2 86.7 88.4  PLT 43*  --  41* 29* 28* 35* 64* 125*   < > = values in this interval not displayed.     Basic Metabolic Panel: Recent Labs  Lab 12/14/22 0703 12/15/22 0353 12/16/22 0233 12/17/22 0336 12/18/22 0331 12/19/22 0336  NA 135 133* 131* 131* 131* 132*  K 3.0* 3.3* 3.9 3.8 3.3* 3.8  CL 101 100 102 101 99 102  CO2 21* 20* 19* 22 21* 19*  GLUCOSE 115* 117* 122* 127* 126* 121*  BUN 6 <5* 6 8 6 7   CREATININE 0.80 0.87 0.86 0.80 0.88 0.85  CALCIUM 8.1* 8.3* 8.4* 8.7* 9.0 8.7*  MG 1.4* 1.9 1.7 1.7 1.6* 1.8  PHOS 3.0 2.8 3.3 4.3 5.0*  --      GFR: Estimated Creatinine Clearance: 98.4 mL/min (by C-G formula based on SCr of 0.85 mg/dL).  Liver Function Tests: Recent Labs  Lab 12/15/22 0353 12/16/22 0233 12/17/22 0336 12/18/22 0331 12/19/22 0336  AST 118* 106* 116* 87* 77*  ALT 39 35 41 42 37  ALKPHOS 37* 33* 30* 34* 32*  BILITOT 1.6* 1.1 0.9 0.8 0.7  PROT 7.5 6.5 6.3* 7.2 7.0  ALBUMIN 3.6 3.0* 2.9* 3.1* 3.0*    No results for input(s): "LIPASE", "AMYLASE" in the last 168 hours.  No results for input(s): "AMMONIA" in the last 168 hours.  Coagulation Profile: No results for input(s): "INR", "PROTIME" in the last 168 hours.   Cardiac Enzymes: No results for  input(s): "CKTOTAL", "CKMB", "CKMBINDEX", "TROPONINI" in the last 168 hours.  BNP (last 3 results) No results for input(s): "PROBNP" in the last 8760 hours.  Lipid Profile: No results for input(s): "CHOL", "HDL", "LDLCALC", "TRIG", "CHOLHDL", "LDLDIRECT" in the last 72 hours.  Thyroid Function Tests: No results for input(s): "TSH", "T4TOTAL", "FREET4", "T3FREE", "THYROIDAB" in the last 72 hours.  Anemia Panel: No results for input(s): "VITAMINB12", "FOLATE", "FERRITIN", "TIBC", "IRON", "RETICCTPCT" in the last 72 hours.  Urine analysis:  Component Value Date/Time   COLORURINE YELLOW 05/22/2022 0449   APPEARANCEUR CLEAR 05/22/2022 0449   LABSPEC 1.038 (H) 05/22/2022 0449   PHURINE 5.0 05/22/2022 0449   GLUCOSEU NEGATIVE 05/22/2022 0449   HGBUR SMALL (A) 05/22/2022 0449   BILIRUBINUR NEGATIVE 05/22/2022 0449   BILIRUBINUR small (A) 11/22/2018 0906   BILIRUBINUR neg 08/02/2015 1039   KETONESUR NEGATIVE 05/22/2022 0449   PROTEINUR NEGATIVE 05/22/2022 0449   UROBILINOGEN 1.0 11/22/2018 0906   UROBILINOGEN 0.2 06/19/2014 1556   NITRITE NEGATIVE 05/22/2022 0449   LEUKOCYTESUR TRACE (A) 05/22/2022 0449    Sepsis Labs: Lactic Acid, Venous    Component Value Date/Time   LATICACIDVEN 4.0 (HH) 12/12/2022 2022    MICROBIOLOGY: No results found for this or any previous visit (from the past 240 hour(s)).  RADIOLOGY STUDIES/RESULTS: No results found.   LOS: 8 days   Jeoffrey Massed, MD  [facility]    To contact the attending provider between 7A-7P or the covering provider during after hours 7P-7A, please log into the web site [email or web address] and access using universal [place] password for that web site. If you do not have the password, please call the hospital operator.  12/20/2022, 11:07 AM

## 2022-12-20 NOTE — Plan of Care (Signed)

## 2022-12-21 NOTE — Progress Notes (Signed)
Patient off unit for HD 

## 2022-12-21 NOTE — Progress Notes (Signed)
PROGRESS NOTE        PATIENT DETAILS Name: Stephen Bowers Age: 62 y.o. Sex: male Date of Birth: [birth year] Admit Date: 12/12/2022 Admitting Physician Charlsie Quest, MD ZOX:WRUEAVW, Binnie Rail, MD  Brief Summary: Patient is a 62 y.o.  male with history of EtOH use, HTN, HLD-who presented with lower GI bleeding with acute blood loss anemia-and alcohol withdrawal seizures.  Hospital course complicated by delirium tremens and severe thrombocytopenia related to alcohol use.  Significant events: 6/11>> admit to [facility]  Significant studies: 6/11>> CT angio GI bleed: No localized site of GI bleeding. 6/12>> EEG: No seizures  Significant microbiology data: None  Procedures: None   Consults: GI  Subjective: Awake/alert-still wants to go home-but acknowledges that his wife does not want him to go-she is at the beach-and will not be back till Saturday.  Objective: Vitals: Blood pressure 118/84, pulse 91, temperature 97.6 F (36.4 C), resp. rate 16, height 5\' 11"  (1.803 m), weight 85.1 kg, SpO2 97 %.   Exam: Gen Exam:Alert awake-not in any distress HEENT:atraumatic, normocephalic Chest: B/L clear to auscultation anteriorly CVS:S1S2 regular Abdomen:soft non tender, non distended Extremities:no edema Neurology: Non focal Skin: no rash  Pertinent Labs/Radiology:    Latest Ref Rng & Units 12/20/2022    3:10 AM 12/19/2022    3:36 AM 12/18/2022    3:31 AM  CBC  WBC 4.0 - 10.5 K/uL 10.0  9.3  9.0   Hemoglobin 13.0 - 17.0 g/dL 9.5  9.8  9.8   Hematocrit 39.0 - 52.0 % 29.1  29.3  28.8   Platelets 150 - 400 K/uL 125  64  35     Lab Results  Component Value Date   NA 132 (L) 12/19/2022   K 3.8 12/19/2022   CL 102 12/19/2022   CO2 19 (L) 12/19/2022      Assessment/Plan: Lower GI bleeding with acute blood loss anemia Resolved Required a total of 5 units of PRBC Hb now stable GI has signed off.  Thrombocytopenia Thought to be due to alcohol  related (prior MD Dr. Randol Kern discussed with hematology/oncology Dr. Parke Poisson) Thankfully platelet count has improved with just supportive care. Repeat CBC in 1 week.  History of alcohol use Alcohol withdrawal with delirium tremens Alcohol withdrawal seizure Significantly improved over the past several days-completely awake and alert. No longer on Librium-has completed Ativan per CIWA protocol. Counseled extensively regarding importance of quitting. He should out of the window for withdrawal symptoms.   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  Discussed with PT/OT-unsafe to be home alone-unfortunately will have to wait for his family to come back from the beach before he can go home.  BMI: Estimated body mass index is 26.17 kg/m as calculated from the following:   Height as of this encounter: 5\' 11"  (1.803 m).   Weight as of this encounter: 85.1 kg.   Code status:   Code Status: Full Code   DVT Prophylaxis: SCDs Start: 12/12/22 1924   Family Communication:    Disposition Plan: Status is: Inpatient Remains inpatient appropriate because: Severity of illness   Planned Discharge Destination:Home with home health   Diet: Diet Order  DIET SOFT Room service appropriate? No; Fluid consistency: Thin  Diet effective now                     Antimicrobial agents: Anti-infectives (From admission, onward)    None        MEDICATIONS: Scheduled Meds:  folic acid  1 mg Oral Daily   multivitamin with minerals  1 tablet Oral Daily   pantoprazole  40 mg Oral BID   sodium chloride flush  3 mL Intravenous Q12H   thiamine  100 mg Oral Daily   Continuous Infusions: PRN Meds:.acetaminophen **OR** acetaminophen,  ondansetron **OR** ondansetron (ZOFRAN) IV, senna-docusate   I have personally reviewed following labs and imaging studies  LABORATORY DATA: CBC: Recent Labs  Lab 12/15/22 0353 12/16/22 0233 12/17/22 0336 12/18/22 0331 12/19/22 0336 12/20/22 0310  WBC 10.9* 7.5 7.8 9.0 9.3 10.0  NEUTROABS 9.3* 5.5 4.8 5.1  --   --   HGB 10.9* 10.2* 9.3* 9.8* 9.8* 9.5*  HCT 31.7* 30.0* 27.5* 28.8* 29.3* 29.1*  MCV 84.5 85.2 84.4 86.2 86.7 88.4  PLT 41* 29* 28* 35* 64* 125*     Basic Metabolic Panel: Recent Labs  Lab 12/15/22 0353 12/16/22 0233 12/17/22 0336 12/18/22 0331 12/19/22 0336  NA 133* 131* 131* 131* 132*  K 3.3* 3.9 3.8 3.3* 3.8  CL 100 102 101 99 102  CO2 20* 19* 22 21* 19*  GLUCOSE 117* 122* 127* 126* 121*  BUN <5* 6 8 6 7   CREATININE 0.87 0.86 0.80 0.88 0.85  CALCIUM 8.3* 8.4* 8.7* 9.0 8.7*  MG 1.9 1.7 1.7 1.6* 1.8  PHOS 2.8 3.3 4.3 5.0*  --      GFR: Estimated Creatinine Clearance: 98.4 mL/min (by C-G formula based on SCr of 0.85 mg/dL).  Liver Function Tests: Recent Labs  Lab 12/15/22 0353 12/16/22 0233 12/17/22 0336 12/18/22 0331 12/19/22 0336  AST 118* 106* 116* 87* 77*  ALT 39 35 41 42 37  ALKPHOS 37* 33* 30* 34* 32*  BILITOT 1.6* 1.1 0.9 0.8 0.7  PROT 7.5 6.5 6.3* 7.2 7.0  ALBUMIN 3.6 3.0* 2.9* 3.1* 3.0*    No results for input(s): "LIPASE", "AMYLASE" in the last 168 hours.  No results for input(s): "AMMONIA" in the last 168 hours.  Coagulation Profile: No results for input(s): "INR", "PROTIME" in the last 168 hours.   Cardiac Enzymes: No results for input(s): "CKTOTAL", "CKMB", "CKMBINDEX", "TROPONINI" in the last 168 hours.  BNP (last 3 results) No results for input(s): "PROBNP" in the last 8760 hours.  Lipid Profile: No results for input(s): "CHOL", "HDL", "LDLCALC", "TRIG", "CHOLHDL", "LDLDIRECT" in the last 72 hours.  Thyroid Function Tests: No results for input(s): "TSH", "T4TOTAL", "FREET4", "T3FREE", "THYROIDAB" in the last 72  hours.  Anemia Panel: No results for input(s): "VITAMINB12", "FOLATE", "FERRITIN", "TIBC", "IRON", "RETICCTPCT" in the last 72 hours.  Urine analysis:    Component Value Date/Time   COLORURINE YELLOW 05/22/2022 0449   APPEARANCEUR CLEAR 05/22/2022 0449   LABSPEC 1.038 (H) 05/22/2022 0449   PHURINE 5.0 05/22/2022 0449   GLUCOSEU NEGATIVE 05/22/2022 0449   HGBUR SMALL (A) 05/22/2022 0449   BILIRUBINUR NEGATIVE 05/22/2022 0449   BILIRUBINUR small (A) 11/22/2018 0906   BILIRUBINUR neg 08/02/2015 1039   KETONESUR NEGATIVE 05/22/2022 0449   PROTEINUR NEGATIVE 05/22/2022 0449   UROBILINOGEN 1.0 11/22/2018 0906   UROBILINOGEN 0.2 06/19/2014 1556   NITRITE NEGATIVE 05/22/2022 0449   LEUKOCYTESUR TRACE (A) 05/22/2022 0449    Sepsis  Labs: Lactic Acid, Venous    Component Value Date/Time   LATICACIDVEN 4.0 (HH) 12/12/2022 2022    MICROBIOLOGY: No results found for this or any previous visit (from the past 240 hour(s)).  RADIOLOGY STUDIES/RESULTS: No results found.   LOS: 9 days   Jeoffrey Massed, MD  [facility]    To contact the attending provider between 7A-7P or the covering provider during after hours 7P-7A, please log into the web site [email or web address] and access using universal [place] password for that web site. If you do not have the password, please call the hospital operator.  12/21/2022, 9:40 AM

## 2022-12-21 NOTE — Progress Notes (Signed)
Mobility Specialist Progress Note   12/21/22 1455  Mobility  Activity Ambulated with assistance in hallway  Level of Assistance Contact guard assist, steadying assist  Assistive Device None  Distance Ambulated (ft) 375 ft  Activity Response Tolerated well  Mobility Referral Yes  $Mobility charge 1 Mobility  Mobility Specialist Start Time (ACUTE ONLY) 1450  Mobility Specialist Stop Time (ACUTE ONLY) 1305  Mobility Specialist Time Calculation (min) (ACUTE ONLY) 1335 min   Received in bed asleep but easily aroused and agreeable. Pt having slight antalgic like gait during ambulation but having no faults or complaints. Returned back to bed w/ all needs met.  Frederico Hamman Mobility Specialist Please contact via SecureChat or  Rehab office at [phone]

## 2022-12-21 NOTE — Progress Notes (Signed)
Physical Therapy Treatment Patient Details Name: Stephen Bowers MRN: [id number] DOB: 1960-08-02 Today's Date: 12/21/2022   History of Present Illness HARSHIT Bowers is a 62 y.o. male who presented 12/12/22 to the ED for evaluation of hematochezia and potential seizure while at work (EEG was negative). CTA negative for GI bleed 6/11. With medical history significant for alcohol use disorder, alcohol associated fatty liver, history of GI bleed from duodenal ulcer, HTN, HLD, pancreatitis.    PT Comments    PT greeted resting in bed and agreeable to session with continued progress towards acute goals. Pt continues to require intermittent min A during safe gait without AD as pt with x1 LOB reaching to push rolling chair out of path as opposed to negotiating around chair, otherwise pt grossly min guard for transfers and gait. Pt able to complete ADLs standing at sink without UE support with no LOB. Pt continues to be limited by decreased activity tolerance, impaired balance/postural reactions and weakness. Pt continues to benefit from skilled PT services to progress toward functional mobility goals.    Recommendations for follow up therapy are one component of a multi-disciplinary discharge planning process, led by the attending physician.  Recommendations may be updated based on patient status, additional functional criteria and insurance authorization.  Follow Up Recommendations       Assistance Recommended at Discharge Intermittent Supervision/Assistance  Patient can return home with the following A little help with walking and/or transfers;A little help with bathing/dressing/bathroom;Assistance with cooking/housework;Assist for transportation;Help with stairs or ramp for entrance   Equipment Recommendations  Other (comment) (Pending progress)    Recommendations for Other Services       Precautions / Restrictions Precautions Precautions: Fall Restrictions Weight Bearing Restrictions:  No     Mobility  Bed Mobility Overal bed mobility: Independent Bed Mobility: Supine to Sit, Sit to Supine     Supine to sit: Supervision, HOB elevated Sit to supine: Supervision, HOB elevated        Transfers Overall transfer level: Needs assistance Equipment used: None Transfers: Sit to/from Stand Sit to Stand: Min guard           General transfer comment: min guard for safety, pt reaching for sink support in rise, increased time to find standing balance.    Ambulation/Gait Ambulation/Gait assistance: Min assist, Min guard Gait Distance (Feet): 225 Feet Assistive device: None Gait Pattern/deviations: Step-through pattern, Drifts right/left, Decreased stride length, Trunk flexed Gait velocity: variable     General Gait Details: Min A required intermittently for balance,  mildly improving obstacle negotiation Pt with one LOB requiring minA to correct when reaching to push rolling chair out of way as opposed to walking around chair, some increased anterior instability with increased gait speed   Stairs             Wheelchair Mobility    Modified Rankin (Stroke Patients Only)       Balance Overall balance assessment: Needs assistance Sitting-balance support: No upper extremity supported, Feet supported Sitting balance-Leahy Scale: Good     Standing balance support: No upper extremity supported Standing balance-Leahy Scale: Fair                              Cognition Arousal/Alertness: Awake/alert Behavior During Therapy: WFL for tasks assessed/performed (tends to be flat affect most of the time but can get a smile from him occassionally) Overall Cognitive Status: Impaired/Different from baseline Area of Impairment: Safety/judgement  Safety/Judgement: Decreased awareness of safety Awareness: Emergent Problem Solving: Slow processing, Difficulty sequencing, Requires verbal cues General Comments: He is  aware that he is moving more slowly than he normally would in his job and 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; at the same time he really wants to go home today        Exercises      General Comments        Pertinent Vitals/Pain Pain Assessment Pain Assessment: No/denies pain    Home Living                          Prior Function            PT Goals (current goals can now be found in the care plan section) Acute Rehab PT Goals Patient Stated Goal: go home PT Goal Formulation: With patient Time For Goal Achievement: 12/31/22 Progress towards PT goals: Progressing toward goals    Frequency    Min 3X/week      PT Plan Current plan remains appropriate    Co-evaluation              AM-PAC PT "6 Clicks" Mobility   Outcome Measure  Help needed turning from your back to your side while in a flat bed without using bedrails?: A Little Help needed moving from lying on your back to sitting on the side of a flat bed without using bedrails?: A Little Help needed moving to and from a bed to a chair (including a wheelchair)?: A Little Help needed standing up from a chair using your arms (e.g., wheelchair or bedside chair)?: A Little Help needed to walk in hospital room?: A Little Help needed climbing 3-5 steps with a railing? : A Lot 6 Click Score: 17    End of Session Equipment Utilized During Treatment: Gait belt Activity Tolerance: Patient tolerated treatment well Patient left: with call bell/phone within reach;in bed;with bed alarm set Nurse Communication: Mobility status PT Visit Diagnosis: Unsteadiness on feet (R26.81);Other abnormalities of gait and mobility (R26.89)     Time: 4540-9811 PT Time Calculation (min) (ACUTE ONLY): 13 min  Charges:  $Gait Training: 8-22 mins                     Baily Serpe R. PTA Acute Rehabilitation Services Office: [phone]   Catalina Antigua 12/21/2022, 9:37 AM

## 2022-12-22 NOTE — Progress Notes (Signed)
PROGRESS NOTE        PATIENT DETAILS Name: Stephen Bowers Age: 62 y.o. Sex: male Date of Birth: 1961-03-10 Admit Date: 12/12/2022 Admitting Physician Charlsie Quest, MD ZOX:WRUEAVW, Binnie Rail, MD  Brief Summary: Patient is a 62 y.o.  male with history of EtOH use, HTN, HLD-who presented with lower GI bleeding with acute blood loss anemia-and alcohol withdrawal seizures.  Hospital course complicated by delirium tremens and severe thrombocytopenia related to alcohol use.  Significant events: 6/11>> admit to [facility]  Significant studies: 6/11>> CT angio GI bleed: No localized site of GI bleeding. 6/12>> EEG: No seizures  Significant microbiology data: None  Procedures: None   Consults: GI  Subjective: No major issues overnight-no complaints-medically stable-wife at the beach and will return tomorrow.  Initially he was thinking of going home-but now reluctantly staying till his wife gets back.  Objective: Vitals: Blood pressure 121/78, pulse 86, temperature 98.9 F (37.2 C), temperature source Oral, resp. rate 15, height 5\' 11"  (1.803 m), weight 85.1 kg, SpO2 96 %.   Exam: Gen Exam:Alert awake-not in any distress HEENT:atraumatic, normocephalic Chest: B/L clear to auscultation anteriorly CVS:S1S2 regular Abdomen:soft non tender, non distended Extremities:no edema Neurology: Non focal Skin: no rash  Pertinent Labs/Radiology:    Latest Ref Rng & Units 12/20/2022    3:10 AM 12/19/2022    3:36 AM 12/18/2022    3:31 AM  CBC  WBC 4.0 - 10.5 K/uL 10.0  9.3  9.0   Hemoglobin 13.0 - 17.0 g/dL 9.5  9.8  9.8   Hematocrit 39.0 - 52.0 % 29.1  29.3  28.8   Platelets 150 - 400 K/uL 125  64  35     Lab Results  Component Value Date   NA 132 (L) 12/19/2022   K 3.8 12/19/2022   CL 102 12/19/2022   CO2 19 (L) 12/19/2022      Assessment/Plan: Lower GI bleeding with acute blood loss anemia Resolved Required a total of 5 units of PRBC Hb now  stable GI has signed off.  Thrombocytopenia Thought to be due to alcohol related (prior MD Dr. Randol Kern discussed with hematology/oncology Dr. Parke Poisson) Thankfully platelet count has improved with just supportive care. Repeat CBC in 1 week.  History of alcohol use Alcohol withdrawal with delirium tremens Alcohol withdrawal seizure Significantly improved over the past several days-completely awake and alert. No longer on Librium-has completed Ativan per CIWA protocol. Counseled extensively regarding importance of quitting. He should out of the window for withdrawal symptoms.   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  Discussed with PT/OT-unsafe to be home alone-unfortunately will have to wait for his family to come back from the beach before he can go home.  BMI: Estimated body mass index is 26.17 kg/m as calculated from the following:   Height as of this encounter: 5\' 11"  (1.803 m).   Weight as of this encounter: 85.1 kg.   Code status:   Code Status: Full Code   DVT Prophylaxis: SCDs Start: 12/12/22 1924   Family Communication:    Disposition Plan: Status is: Inpatient Remains inpatient appropriate because: Severity of illness   Planned Discharge Destination:Home with home health  Diet: Diet Order             DIET SOFT Room service appropriate? No; Fluid consistency: Thin  Diet effective now                     Antimicrobial agents: Anti-infectives (From admission, onward)    None        MEDICATIONS: Scheduled Meds:  folic acid  1 mg Oral Daily   multivitamin with minerals  1 tablet Oral Daily   pantoprazole  40 mg Oral BID   sodium chloride flush  3 mL Intravenous Q12H   thiamine  100 mg Oral  Daily   Continuous Infusions: PRN Meds:.acetaminophen **OR** acetaminophen, ondansetron **OR** ondansetron (ZOFRAN) IV, senna-docusate   I have personally reviewed following labs and imaging studies  LABORATORY DATA: CBC: Recent Labs  Lab 12/16/22 0233 12/17/22 0336 12/18/22 0331 12/19/22 0336 12/20/22 0310  WBC 7.5 7.8 9.0 9.3 10.0  NEUTROABS 5.5 4.8 5.1  --   --   HGB 10.2* 9.3* 9.8* 9.8* 9.5*  HCT 30.0* 27.5* 28.8* 29.3* 29.1*  MCV 85.2 84.4 86.2 86.7 88.4  PLT 29* 28* 35* 64* 125*     Basic Metabolic Panel: Recent Labs  Lab 12/16/22 0233 12/17/22 0336 12/18/22 0331 12/19/22 0336  NA 131* 131* 131* 132*  K 3.9 3.8 3.3* 3.8  CL 102 101 99 102  CO2 19* 22 21* 19*  GLUCOSE 122* 127* 126* 121*  BUN 6 8 6 7   CREATININE 0.86 0.80 0.88 0.85  CALCIUM 8.4* 8.7* 9.0 8.7*  MG 1.7 1.7 1.6* 1.8  PHOS 3.3 4.3 5.0*  --      GFR: Estimated Creatinine Clearance: 98.4 mL/min (by C-G formula based on SCr of 0.85 mg/dL).  Liver Function Tests: Recent Labs  Lab 12/16/22 0233 12/17/22 0336 12/18/22 0331 12/19/22 0336  AST 106* 116* 87* 77*  ALT 35 41 42 37  ALKPHOS 33* 30* 34* 32*  BILITOT 1.1 0.9 0.8 0.7  PROT 6.5 6.3* 7.2 7.0  ALBUMIN 3.0* 2.9* 3.1* 3.0*    No results for input(s): "LIPASE", "AMYLASE" in the last 168 hours.  No results for input(s): "AMMONIA" in the last 168 hours.  Coagulation Profile: No results for input(s): "INR", "PROTIME" in the last 168 hours.   Cardiac Enzymes: No results for input(s): "CKTOTAL", "CKMB", "CKMBINDEX", "TROPONINI" in the last 168 hours.  BNP (last 3 results) No results for input(s): "PROBNP" in the last 8760 hours.  Lipid Profile: No results for input(s): "CHOL", "HDL", "LDLCALC", "TRIG", "CHOLHDL", "LDLDIRECT" in the last 72 hours.  Thyroid Function Tests: No results for input(s): "TSH", "T4TOTAL", "FREET4", "T3FREE", "THYROIDAB" in the last 72 hours.  Anemia Panel: No results for input(s): "VITAMINB12",  "FOLATE", "FERRITIN", "TIBC", "IRON", "RETICCTPCT" in the last 72 hours.  Urine analysis:    Component Value Date/Time   COLORURINE YELLOW 05/22/2022 0449   APPEARANCEUR CLEAR 05/22/2022 0449   LABSPEC 1.038 (H) 05/22/2022 0449   PHURINE 5.0 05/22/2022 0449   GLUCOSEU NEGATIVE 05/22/2022 0449   HGBUR SMALL (A) 05/22/2022 0449   BILIRUBINUR NEGATIVE 05/22/2022 0449   BILIRUBINUR small (A) 11/22/2018 0906   BILIRUBINUR neg 08/02/2015 1039   KETONESUR NEGATIVE 05/22/2022 0449   PROTEINUR NEGATIVE 05/22/2022 0449   UROBILINOGEN 1.0 11/22/2018 0906   UROBILINOGEN 0.2 06/19/2014 1556   NITRITE NEGATIVE 05/22/2022 0449   LEUKOCYTESUR TRACE (A) 05/22/2022 0449    Sepsis Labs: Lactic Acid, Venous    Component Value Date/Time   LATICACIDVEN  4.0 (HH) 12/12/2022 2022    MICROBIOLOGY: No results found for this or any previous visit (from the past 240 hour(s)).  RADIOLOGY STUDIES/RESULTS: No results found.   LOS: 10 days   Jeoffrey Massed, MD  [facility]    To contact the attending provider between 7A-7P or the covering provider during after hours 7P-7A, please log into the web site [email or web address] and access using universal [place] password for that web site. If you do not have the password, please call the hospital operator.  12/22/2022, 11:21 AM

## 2022-12-22 NOTE — Progress Notes (Signed)
Occupational Therapy Treatment Patient Details Name: Stephen Bowers MRN: [id number] DOB: May 26, 1961 Today's Date: 12/22/2022   History of present illness Stephen Bowers is a 62 y.o. male who presented 12/12/22 to the ED for evaluation of hematochezia and potential seizure while at work (EEG was negative). CTA negative for GI bleed 6/11. With medical history significant for alcohol use disorder, alcohol associated fatty liver, history of GI bleed from duodenal ulcer, HTN, HLD, pancreatitis.   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 He will continue to benefit from acute OT with follow up at [facility].   Recommendations for follow up therapy are one component of a multi-disciplinary discharge planning process, led by the attending physician.  Recommendations may be updated based on patient status, additional functional criteria and insurance authorization.    Assistance Recommended at Discharge Intermittent Supervision/Assistance  Patient can return home with the following  A little help with walking and/or transfers;A little help with bathing/dressing/bathroom;Assistance with cooking/housework;Help with stairs or ramp for entrance;Assist for transportation;Direct supervision/assist for financial management;Direct supervision/assist for medications management   Equipment Recommendations  None recommended by OT       Precautions / Restrictions Precautions Precautions: Fall Restrictions Weight Bearing Restrictions: No       Mobility Bed Mobility Overal bed mobility: Independent                  Transfers Overall transfer level: Needs assistance Equipment used: None Transfers: Sit to/from Stand Sit to Stand: Min guard           General  transfer comment: Pt ambulates at a slower pace than he would if he were doing custoidal work (his job)     Development worker, international aid Overall balance assessment: Needs assistance Sitting-balance support: No upper extremity supported, Feet supported Sitting balance-Leahy Scale: Good     Standing balance support: No upper extremity supported Standing balance-Leahy Scale: Fair Standing balance comment: standing at sink to wash up                           ADL either performed or assessed with clinical judgement   ADL Overall ADL's : Needs assistance/impaired         Upper Body Bathing: Set up Upper Body Bathing Details (indicate cue type and reason): intermittent S sit<>stand at sink in room Lower Body Bathing: Set up Lower Body Bathing Details (indicate cue type and reason): intermittent S sit<>stand at sink in room         Toilet Transfer: Min Pension scheme manager Details (indicate cue type and reason): simulated OOB>to sink to wash up and get dresses> sit in recliner on other side of bed                Extremity/Trunk Assessment Upper Extremity Assessment Upper Extremity Assessment: Overall WFL for tasks assessed            Vision Baseline Vision/History: 1 Wears glasses (reading) Patient Visual Report: No change from baseline            Cognition Arousal/Alertness: Awake/alert Behavior During Therapy: WFL for tasks assessed/performed (tends to be flat affect mostly) Overall Cognitive Status: Impaired/Different from baseline Area  of Impairment: Problem solving                             Problem Solving: Slow processing, Difficulty sequencing General Comments: It took him more effort than the average person to figure out how to and get scrub bottoms and tops on, but he did ie                   Pertinent Vitals/ Pain       Pain Assessment Pain Assessment: No/denies pain         Frequency  Min 1X/week        Progress Toward  Goals  OT Goals(current goals can now be found in the care plan section)  Progress towards OT goals: Progressing toward goals  Acute Rehab OT Goals Patient Stated Goal: to go home tomorrow when wife returns from beach OT Goal Formulation: With patient Time For Goal Achievement: 01/01/23 Potential to Achieve Goals: Good  Plan Discharge plan remains appropriate       AM-PAC OT "6 Clicks" Daily Activity     Outcome Measure   Help from another person eating meals?: None Help from another person taking care of personal grooming?: A Little Help from another person toileting, which includes using toliet, bedpan, or urinal?: A Little Help from another person bathing (including washing, rinsing, drying)?: A Little Help from another person to put on and taking off regular upper body clothing?: A Little Help from another person to put on and taking off regular lower body clothing?: A Little 6 Click Score: 19    End of Session Equipment Utilized During Treatment:  (none)  OT Visit Diagnosis: Unsteadiness on feet (R26.81);Other abnormalities of gait and mobility (R26.89);Other symptoms and signs involving cognitive function   Activity Tolerance Patient tolerated treatment well   Patient Left in chair;with call bell/phone within reach;with chair alarm set           Time: 1007-1031 OT Time Calculation (min): 24 min  Charges: OT General Charges $OT Visit: 1 Visit OT Treatments $Self Care/Home Management : 23-37 mins  Lindon Romp OT Acute Rehabilitation Services Office [phone]    Evette Georges 12/22/2022, 11:12 AM

## 2022-12-22 NOTE — Care Management (Addendum)
Spoke w [facility] liaison they are unable to accept for [facility] services.  Patient was last walking 375 w mobility tech. No DME needs identified at this time

## 2022-12-22 NOTE — TOC Progression Note (Signed)
Transition of Care [facility]) - Progression Note    Patient Details  Name: Stephen Bowers MRN: [id number] Date of Birth: 25-Jan-1961  [facility] of [facility]) CM/SW Contact  Mearl Latin, LCSW Phone Number: 12/22/2022, 3:48 PM  Clinical Narrative:    Per Financial team, they ran the insurance information and stated the coverage is inactive.   Substance use resources placed on AVS.   Expected Discharge Plan: IP Rehab Facility Barriers to Discharge: Continued Medical Work up, Conservator, museum/gallery and Services In-house Referral: Clinical Social Work   Post Acute Care Choice: Skilled Nursing Facility, IP Rehab Living arrangements for the past 2 months: Single Family Home                                       Social Determinants of Health (SDOH) Interventions SDOH Screenings   Food Insecurity: No Food Insecurity (12/12/2022)  Housing: Low Risk  (12/12/2022)  Transportation Needs: No Transportation Needs (12/12/2022)  Utilities: Not At Risk (12/12/2022)  Depression (PHQ2-9): Low Risk  (05/07/2020)  Tobacco Use: Medium Risk (12/12/2022)    Readmission Risk Interventions     No data to display

## 2022-12-22 NOTE — Discharge Instructions (Signed)
In a time of Crisis: [facility]  Mobile Crisis Management provides immediate crisis response, 24/7.  Call [phone]  [facility] for MH/DD/SA [facility] is available 24 hours a day, 7 days a week. Customer Service Specialists will assist you to find a crisis provider that is well-matched with your needs. Your local number is: [phone]  [facility] ([facility]) IOP, individual counseling, medication management [phone] [place], [place] [address] [phone] Call for intake hours; Medicaid and Uninsured    Substance Use Outpatient Providers  [facility] ([facility] Group and individual counseling. [address]  [place], [place] [address] [phone] [place]: [phone]  [place]: [phone] Medicaid and uninsured.   [facility] Offers IOP groups multiple times per week. [address] [place], [place] [address] [phone] Takes Medicaid and other insurances.   [facility]  Chemical Dependency Intensive Outpatient Program (IOP) [address] [place], [place] [address] [phone] Takes Nurse, learning disability and [place].   [facility]  IOP and Partial Hospitalization Program  [address]  [place], [place] [address] [phone] Private Insurance, [place] only for partial hospitalization  [facility] [address], [place], [place] [address] [phone] Monday-Friday. Short and Long term options.  [facility] ([facility]) IOP, individual counseling, medication management [address] [place], [place] [address] [phone] Medicaid and [facility]  [facility] [address]  [place], [place] [address] [phone] Private Insurance and Self Pay   [facility] [address]  [place], [place] [address] [phone] Private Insurance, [place], and Self Pay   [facility]  [address] [place], [place] [address] [facility]  [address]  [place], [place] [address] [phone]  [facility]  [address] [place], [place] [address] [phone]      Residential Treatment Programs  [facility] [address] [place], [place] [address] [phone] or [phone] Detox and Residential Rehab 21 days (Medicaid, private insurance, and self pay. If Medicare, will look into funding). No methadone. Call for pre-screen.   [facility]) [address]  [place], [place] [address] [phone] Detox 3-7 days (self Pay and Medicaid Limited availability). Transitional Program for females needs 60 days clean first.  Rehab Only for Males (Medicare, Medicaid, and Self Pay)-No methadone.  [facility] [address] [place], [place] [address] [phone] or [phone] Private Insurance only  [facility] PHONE: [phone] FAX: [phone] Residential program for women 21 and over for up to a year through a Christian 12-step recovery model. Self-pay.    [facility] [address] [place], [place] [address] Phone:  [phone] Must be detoxed 72 hours prior to admission; 28 day program.  Self-pay.  [facility] [address]  [place], [place] [phone] [employer], Medicare, [place] (not straight [place]). They offer assistance with transportation.   [facility] [address] [place],  [place], [place] [address] [phone] Christian Based Program. Men only. No insurance  [facility] is a substance use disorder treatment program for women, including those who are pregnant, parenting, and/or whose lives have been touched by abuse and violence. (800)  [phone]  [facility] [address]  [place], [place] [address] Women's: [phone] Men's: [phone] No Medicaid.   [facility] [place] Locations across the [place] (mainly [place]) willing to help with transportation.  [phone] [employer]. [facility]  [address] [place]  [place], [place] [address] [phone] Treatment Only, must make assessment appointment, and must be sober for assessment appointment. Self pay, [facility], must be [facility] resident. No methadone.   [facility]  [address] [place], [place] [address] [phone] No pending legal charges, Long-term work program. No methadone. Call for assessment.  [facility]  [address] [place], [place] [address] [phone] or [phone] Commercial Insurance Only  [facility] - [phone] Private Insurance (no [place]). Males/Females, call to make referrals, multiple facilities.   [facility]: [facility] [address] [place], [place] [address] [phone]  [facility]-no methadone: [facility] [address] [place], [place] [address] [phone] [facility] [phone] [place], [place] For women, houses 8 residents for sober living. No Medicaid.

## 2022-12-22 NOTE — TOC Progression Note (Signed)
Transition of Care [facility]) - Progression Note    Patient Details  Name: Stephen Bowers MRN: [id number] Date of Birth: 1960/08/27  [facility]) CM/SW Contact  Gordy Clement, RN Phone Number: 12/22/2022, 12:38 PM  Clinical Narrative:     CM has called Wife to let her know that her Husband will be discharged Saturday morning. HH PT and OT have been ordered. Patient is uninsured.  Pending decision from [facility] .     TOC will continue to follow patient for any additional discharge needs      Expected Discharge Plan: IP Rehab Facility Barriers to Discharge: Continued Medical Work up, Conservator, museum/gallery and Services In-house Referral: Clinical Social Work   Post Acute Care Choice: Skilled Nursing Facility, IP Rehab Living arrangements for the past 2 months: Single Family Home                                       Social Determinants of Health (SDOH) Interventions SDOH Screenings   Food Insecurity: No Food Insecurity (12/12/2022)  Housing: Low Risk  (12/12/2022)  Transportation Needs: No Transportation Needs (12/12/2022)  Utilities: Not At Risk (12/12/2022)  Depression (PHQ2-9): Low Risk  (05/07/2020)  Tobacco Use: Medium Risk (12/12/2022)    Readmission Risk Interventions     No data to display

## 2022-12-23 ENCOUNTER — Other Ambulatory Visit (HOSPITAL_COMMUNITY): Payer: Self-pay

## 2022-12-23 MED ORDER — THIAMINE HCL 100 MG PO TABS
100.0000 mg | ORAL_TABLET | Freq: Every day | ORAL | 0 refills | Status: DC
  Filled 2022-12-23: qty 30, 30d supply, fill #0

## 2022-12-23 MED ORDER — ADULT MULTIVITAMIN W/MINERALS CH
1.0000 | ORAL_TABLET | Freq: Every day | ORAL | 0 refills | Status: DC
  Filled 2022-12-23: qty 30, 30d supply, fill #0

## 2022-12-23 MED ORDER — FOLIC ACID 1 MG PO TABS
1.0000 mg | ORAL_TABLET | Freq: Every day | ORAL | 0 refills | Status: DC
  Filled 2022-12-23: qty 30, 30d supply, fill #0

## 2022-12-23 MED ORDER — PANTOPRAZOLE SODIUM 40 MG PO TBEC
40.0000 mg | DELAYED_RELEASE_TABLET | Freq: Every day | ORAL | 1 refills | Status: DC
Start: 2022-12-23 — End: 2023-06-06
  Filled 2022-12-23: qty 30, 30d supply, fill #0

## 2022-12-23 NOTE — Progress Notes (Signed)
Discharge paperwork reviewed with patient. He is waiting on his wife to come pick him up. Patient spoke to his wife and reports she is on the way to pick him up from the beach. This RN called [facility] wife twice and was unable to leave a voicemail due to it being full. Will try again.

## 2022-12-23 NOTE — Progress Notes (Signed)
Patients wife has arrived to the bedside to pick her husband up and take him home.

## 2022-12-23 NOTE — Progress Notes (Signed)
Mobility Specialist Progress Note   12/23/22 1233  Mobility  Activity Ambulated with assistance in room;Dangled on edge of bed  Level of Assistance Contact guard assist, steadying assist  Assistive Device None  Distance Ambulated (ft) 25 ft  Range of Motion/Exercises Active;All extremities  Activity Response Tolerated well   Patient received standing at bedside with bed alarm active. Stated he was looking for his nurse. Ambulated min guard with slow steady gait. Returned to bed without complaint or incident. Was left in supine with all needs met, call bell in reach and bed alarm set.   Stephen Bowers, BS EXP Mobility Specialist Please contact via SecureChat or Rehab office at [phone]

## 2022-12-23 NOTE — Discharge Summary (Signed)
PATIENT DETAILS Name: Stephen Bowers Age: 62 y.o. Sex: male Date of Birth: 03/04/1961 MRN: [id number]. Admitting Physician: Charlsie Quest, MD WUJ:[id number], Binnie Rail, MD  Admit Date: 12/12/2022 Discharge date: 12/23/2022  Recommendations for Outpatient Follow-up:  Follow up with PCP in 1-2 weeks Please obtain CMP/CBC in one week  Admitted From:  Home  Disposition: Home health   Discharge Condition: good  CODE STATUS:   Code Status: Full Code   Diet recommendation:  Diet Order             Diet - low sodium heart healthy           DIET SOFT Room service appropriate? No; Fluid consistency: Thin  Diet effective now                    Brief Summary: Patient is a 62 y.o.  male with history of EtOH use, HTN, HLD-who presented with lower GI bleeding with acute blood loss anemia-and alcohol withdrawal seizures.  Hospital course complicated by delirium tremens and severe thrombocytopenia related to alcohol use.   Significant events: 6/11>> admit to [facility]   Significant studies: 6/11>> CT angio GI bleed: No localized site of GI bleeding. 6/12>> EEG: No seizures   Significant microbiology data: None   Procedures: None    Consults: GI  Brief Hospital Course: Lower GI bleeding with acute blood loss anemia Resolved Required a total of 5 units of PRBC Hb now stable GI has signed off.   Thrombocytopenia Thought to be due to alcohol related (prior MD Dr. Randol Kern discussed with hematology/oncology Dr. Parke Poisson) Thankfully platelet count has improved with just supportive care. Repeat CBC in 1 week.   History of alcohol use Alcohol withdrawal with delirium tremens Alcohol withdrawal seizure Significantly improved over the past several days-completely awake and alert. No longer on Librium-has completed Ativan per CIWA protocol. Counseled extensively regarding importance of quitting. He should out of the window for withdrawal symptoms.    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  Home health ordered.  His wife/family is coming back from the beach today-plan to discharge home with family today.   BMI: Estimated body mass index is 26.17 kg/m as calculated from the following:   Height as of this encounter: 5\' 11"  (1.803 m).   Weight as of this encounter: 85.1 kg.    Discharge Diagnoses:  Principal Problem:   Acute blood loss anemia Active Problems:   Alcohol withdrawal seizure (HCC)   Thrombocytopenia (HCC)   Symptomatic anemia   Hematochezia   ABLA (acute blood loss anemia)   Lactic acidosis   Alcohol use   Discharge Instructions:  Activity:  As tolerated    Discharge Instructions     Call MD for:  difficulty breathing, headache or visual disturbances   Complete by: As directed    Call MD for:  extreme fatigue   Complete by: As directed    Diet - low sodium heart healthy   Complete by: As directed    Discharge instructions   Complete by: As directed    Follow with Primary MD  [name], MD in 1-2 weeks  Your blood pressure medications have been held-as your blood pressure is stable without the use  of any antihypertensives.  Please reassess with your primary care practitioner at next visit.  Stop all alcohol use.  Please get a complete blood count and chemistry panel checked by your Primary MD at your next visit, and again as instructed by your Primary MD.  Get Medicines reviewed and adjusted: Please take all your medications with you for your next visit with your Primary MD  Laboratory/radiological data: Please request your Primary MD to go over all hospital tests and procedure/radiological results at  the follow up, please ask your Primary MD to get all Hospital records sent to his/her office.  In some cases, they will be blood work, cultures and biopsy results pending at the time of your discharge. Please request that your primary care M.D. follows up on these results.  Also Note the following: If you experience worsening of your admission symptoms, develop shortness of breath, life threatening emergency, suicidal or homicidal thoughts you must seek medical attention immediately by calling 911 or calling your MD immediately  if symptoms less severe.  You must read complete instructions/literature along with all the possible adverse reactions/side effects for all the Medicines you take and that have been prescribed to you. Take any new Medicines after you have completely understood and accpet all the possible adverse reactions/side effects.   Do not drive when taking Pain medications or sleeping medications (Benzodaizepines)  Do not take more than prescribed Pain, Sleep and Anxiety Medications. It is not advisable to combine anxiety,sleep and pain medications without talking with your primary care practitioner  Special Instructions: If you have smoked or chewed Tobacco  in the last 2 yrs please stop smoking, stop any regular Alcohol  and or any Recreational drug use.  Wear Seat belts while driving.  Please note: You were cared for by a hospitalist during your hospital stay. Once you are discharged, your primary care physician will handle any further medical issues. Please note that NO REFILLS for any discharge medications will be authorized once you are discharged, as it is imperative that you return to your primary care physician (or establish a relationship with a primary care physician if you do not have one) for your post hospital discharge needs so that they can reassess your need for medications and monitor your lab values.   Increase activity slowly   Complete by: As directed        Allergies as of 12/23/2022   No Known Allergies      Medication List     STOP taking these medications    amLODipine 10 MG tablet Commonly known as: NORVASC   colchicine 0.6 MG tablet   ferrous sulfate 325 (65 FE) MG tablet   thiamine 100 MG tablet Commonly known as: VITAMIN B1   ZINC PO       TAKE these medications    diphenhydramine-acetaminophen 25-500 MG Tabs tablet Commonly known as: TYLENOL PM Take 2 tablets by mouth at bedtime as needed (sleep, pain).   ELDERBERRY PO Take 1 capsule by mouth daily.   folic acid 1 MG tablet Commonly known as: FOLVITE Take 1 tablet (1 mg total) by mouth daily.   multivitamin with minerals Tabs tablet Take 1 tablet by mouth daily.   pantoprazole 40 MG tablet Commonly known as: PROTONIX Take 1 tablet (40 mg total) by mouth daily. What changed: when to take this   thiamine 100 MG tablet Commonly known as: Vitamin B-1 Take 1 tablet (100 mg total) by mouth daily.   VITAMIN C PO  Take 1 tablet by mouth daily.   VITAMIN D-3 PO Take 1 capsule by mouth daily.   VITAMIN E PO Take 1 capsule by mouth daily.        No Known Allergies   Other Procedures/Studies: EEG adult  Result Date: 12/29/22 Charlsie Quest, MD     December 29, 2022  8:31 AM Patient Name: Stephen Bowers MRN: [id number] Epilepsy Attending: Charlsie Quest Referring Physician/Provider: Ernie Avena, MD Date: 12/12/2022 Duration: 27.22 mins Patient history: 62yo M patient reportedly had a seizure episode while at work prior to presentation. Concern is for alcohol withdrawal seizure. EEG to evaluate for seizure Level of alertness: Awake AEDs during EEG study: None Technical aspects: This EEG study was done with scalp electrodes positioned according to the 10-20 International system of electrode placement. Electrical activity was reviewed with band pass filter of 1-70Hz , sensitivity of 7 uV/mm, display speed of 24mm/sec with a 60Hz  notched filter applied as  appropriate. EEG data were recorded continuously and digitally stored.  Video monitoring was available and reviewed as appropriate. Description: The posterior dominant rhythm consists of 8-9 Hz activity of moderate voltage (25-35 uV) seen predominantly in posterior head regions, symmetric and reactive to eye opening and eye closing. Physiologic photic driving was not seen during photic stimulation.  Hyperventilation was performed.   IMPRESSION: This study is within normal limits. No seizures or epileptiform discharges were seen throughout the recording. A normal interictal EEG does not exclude the diagnosis of epilepsy. Priyanka Annabelle Harman   CT ANGIO GI BLEED  Result Date: 12/12/2022 CLINICAL DATA:  Acute mesenteric ischemia. GI bleed. EXAM: CTA ABDOMEN AND PELVIS WITHOUT AND WITH CONTRAST TECHNIQUE: Multidetector CT imaging of the abdomen and pelvis was performed using the standard protocol during bolus administration of intravenous contrast. Multiplanar reconstructed images and MIPs were obtained and reviewed to evaluate the vascular anatomy. RADIATION DOSE REDUCTION: This exam was performed according to the departmental dose-optimization program which includes automated exposure control, adjustment of the mA and/or kV according to patient size and/or use of iterative reconstruction technique. CONTRAST:  OMNIPAQUE IOHEXOL 350 MG/ML SOLN COMPARISON:  Abdominopelvic CT 05/22/2022 FINDINGS: VASCULAR Aorta: Moderately calcified. Normal caliber aorta without aneurysm, dissection, vasculitis or significant stenosis. Celiac: Patent without evidence of aneurysm, dissection, or vasculitis. Mild stenosis at the origin due to the diaphragmatic crus. SMA: Patent without evidence of aneurysm, dissection, vasculitis or significant stenosis. Renals: Single right and 2 left renal arteries. All renal arteries are patent. Mild atherosclerosis. No dissection, aneurysm or vasculitis. IMA: Patent without evidence of aneurysm,  dissection, vasculitis or significant stenosis. Inflow: Patent without evidence of aneurysm, dissection, vasculitis or significant stenosis. Proximal Outflow: Bilateral common femoral and visualized portions of the superficial and profunda femoral arteries are patent without evidence of aneurysm, dissection, vasculitis or significant stenosis. Veins: Venous phase imaging demonstrates patency of the iliac veins and IVC. No thrombus within the mesenteric or portal veins. The splenic vein is patent. Review of the MIP images confirms the above findings. NON-VASCULAR Lower chest: No basilar airspace disease or pleural effusion. Normal heart size with coronary artery calcifications. Hepatobiliary: Advanced hepatic steatosis. Focal fatty sparing adjacent to the gallbladder fossa. No focal liver abnormality Gallbladder physiologically distended, no calcified stone. No biliary dilatation. Pancreas: No ductal dilatation or inflammation. Spleen: No splenomegaly. No focal splenic abnormality. Adrenals/Urinary Tract: No adrenal nodule. There is chronic bilateral perinephric edema. No hydronephrosis. Subcentimeter cyst from the posterior left kidney a punctate calcification and is unchanged from prior exam. No  specific imaging follow-up is needed. Unremarkable urinary bladder. Stomach/Bowel: There is no contrast accumulating in the GI tract to localize site of GI bleed. No bowel wall thickening or pneumatosis to suggest mesenteric ischemia. No bowel inflammation or obstruction. Again seen wall thickening of the distal esophagus. The appendix is normal. Lymphatic: No adenopathy. Reproductive: Prostate is unremarkable. Other: No ascites. Mild patchy soft tissue edema in the left flank. Bilateral perinephric and retroperitoneal stranding is chronic and unchanged. Fat in both inguinal canals. Small subcutaneous lipoma overlies the right hip, chronic. Musculoskeletal: There are no acute or suspicious osseous abnormalities. IMPRESSION:  1. No contrast accumulating in the GI tract to localize site of GI bleed. No evidence of bowel inflammation, pneumatosis, or bowel wall thickening to suggest mesenteric ischemia. 2. Wall thickening about the distal esophagus can be seen with reflux or esophagitis. 3. Chronic bilateral perinephric stranding. 4. Aortic atherosclerosis.  Coronary artery calcifications. 5. Advanced hepatic steatosis. Aortic Atherosclerosis (ICD10-I70.0). Electronically Signed   By: Narda Rutherford M.D.   On: 12/12/2022 18:05     TODAY-DAY OF DISCHARGE:  Subjective:   Mickle Plumb today has no headache,no chest abdominal pain,no new weakness tingling or numbness, feels much better wants to go home today.   Objective:   Blood pressure 121/78, pulse (!) 245, temperature 98.9 F (37.2 C), temperature source Oral, resp. rate 18, height 5\' 11"  (1.803 m), weight 85.1 kg, SpO2 (!) 85 %.  Intake/Output Summary (Last 24 hours) at 12/23/2022 0836 Last data filed at 12/22/2022 2300 Gross per 24 hour  Intake 603 ml  Output 975 ml  Net -372 ml   Filed Weights   12/12/22 2339  Weight: 85.1 kg    Exam: Awake Alert, Oriented *3, No new F.N deficits, Normal affect [place].AT,PERRAL Supple Neck,No JVD, No cervical lymphadenopathy appriciated.  Symmetrical Chest wall movement, Good air movement bilaterally, CTAB RRR,No Gallops,Rubs or new Murmurs, No Parasternal Heave +ve B.Sounds, Abd Soft, Non tender, No organomegaly appriciated, No rebound -guarding or rigidity. No Cyanosis, Clubbing or edema, No new Rash or bruise   PERTINENT RADIOLOGIC STUDIES: No results found.   PERTINENT LAB RESULTS: CBC: No results for input(s): "WBC", "HGB", "HCT", "PLT" in the last 72 hours. CMET CMP     Component Value Date/Time   NA 132 (L) 12/19/2022 0336   NA 139 04/22/2020 1508   K 3.8 12/19/2022 0336   CL 102 12/19/2022 0336   CO2 19 (L) 12/19/2022 0336   GLUCOSE 121 (H) 12/19/2022 0336   BUN 7 12/19/2022 0336   BUN 8  04/22/2020 1508   CREATININE 0.85 12/19/2022 0336   CREATININE 0.95 06/12/2016 1733   CALCIUM 8.7 (L) 12/19/2022 0336   PROT 7.0 12/19/2022 0336   PROT 8.2 04/22/2020 1508   ALBUMIN 3.0 (L) 12/19/2022 0336   ALBUMIN 4.0 04/22/2020 1508   AST 77 (H) 12/19/2022 0336   ALT 37 12/19/2022 0336   ALKPHOS 32 (L) 12/19/2022 0336   BILITOT 0.7 12/19/2022 0336   BILITOT 0.3 04/22/2020 1508   GFRNONAA >60 12/19/2022 0336   GFRNONAA >89 06/12/2016 1733    GFR Estimated Creatinine Clearance: 98.4 mL/min (by C-G formula based on SCr of 0.85 mg/dL). No results for input(s): "LIPASE", "AMYLASE" in the last 72 hours. No results for input(s): "CKTOTAL", "CKMB", "CKMBINDEX", "TROPONINI" in the last 72 hours. Invalid input(s): "POCBNP" No results for input(s): "DDIMER" in the last 72 hours. No results for input(s): "HGBA1C" in the last 72 hours. No results for input(s): "CHOL", "HDL", "LDLCALC", "  TRIG", "CHOLHDL", "LDLDIRECT" in the last 72 hours. No results for input(s): "TSH", "T4TOTAL", "T3FREE", "THYROIDAB" in the last 72 hours.  Invalid input(s): "FREET3" No results for input(s): "VITAMINB12", "FOLATE", "FERRITIN", "TIBC", "IRON", "RETICCTPCT" in the last 72 hours. Coags: No results for input(s): "INR" in the last 72 hours.  Invalid input(s): "PT" Microbiology: No results found for this or any previous visit (from the past 240 hour(s)).  FURTHER DISCHARGE INSTRUCTIONS:  Get Medicines reviewed and adjusted: Please take all your medications with you for your next visit with your Primary MD  Laboratory/radiological data: Please request your Primary MD to go over all hospital tests and procedure/radiological results at the follow up, please ask your Primary MD to get all Hospital records sent to his/her office.  In some cases, they will be blood work, cultures and biopsy results pending at the time of your discharge. Please request that your primary care M.D. goes through all the records of  your hospital data and follows up on these results.  Also Note the following: If you experience worsening of your admission symptoms, develop shortness of breath, life threatening emergency, suicidal or homicidal thoughts you must seek medical attention immediately by calling 911 or calling your MD immediately  if symptoms less severe.  You must read complete instructions/literature along with all the possible adverse reactions/side effects for all the Medicines you take and that have been prescribed to you. Take any new Medicines after you have completely understood and accpet all the possible adverse reactions/side effects.   Do not drive when taking Pain medications or sleeping medications (Benzodaizepines)  Do not take more than prescribed Pain, Sleep and Anxiety Medications. It is not advisable to combine anxiety,sleep and pain medications without talking with your primary care practitioner  Special Instructions: If you have smoked or chewed Tobacco  in the last 2 yrs please stop smoking, stop any regular Alcohol  and or any Recreational drug use.  Wear Seat belts while driving.  Please note: You were cared for by a hospitalist during your hospital stay. Once you are discharged, your primary care physician will handle any further medical issues. Please note that NO REFILLS for any discharge medications will be authorized once you are discharged, as it is imperative that you return to your primary care physician (or establish a relationship with a primary care physician if you do not have one) for your post hospital discharge needs so that they can reassess your need for medications and monitor your lab values.  Total Time spent coordinating discharge including counseling, education and face to face time equals greater than 30 minutes.  SignedJeoffrey Massed 12/23/2022 8:36 AM

## 2022-12-23 NOTE — Plan of Care (Signed)

## 2022-12-25 ENCOUNTER — Telehealth: Payer: Self-pay | Admitting: *Deleted

## 2022-12-25 NOTE — Telephone Encounter (Signed)
Patient called to notify of labs ordered via Dr. [name], hours 7:30-5pm. M-F., no appt needed. Patient understood and agreed.

## 2022-12-25 NOTE — Telephone Encounter (Signed)
-----   Message from Chrystie Nose, RN sent at 12/25/2022  7:58 AM EDT ----- Regarding: FW: CBC  ----- Message ----- From: Chrystie Nose, RN Sent: 12/25/2022  12:00 AM EDT To: Chrystie Nose, RN Subject: CBC                                            Pt needs to have a CBC drawn. Order already placed in epic.

## 2022-12-26 ENCOUNTER — Telehealth: Payer: Self-pay

## 2022-12-26 NOTE — Transitions of Care (Post Inpatient/ED Visit) (Signed)
   12/26/2022  Name: Stephen Bowers MRN: [id number] DOB: June 28, 1961  Today's TOC FU Call Status: Today's TOC FU Call Status:: Unsuccessul Call (1st Attempt) Unsuccessful Call (1st Attempt) Date: 12/26/22  Attempted to reach the patient regarding the most recent Inpatient/ED visit.  Follow Up Plan: Additional outreach attempts will be made to reach the patient to complete the Transitions of Care (Post Inpatient/ED visit) call.   Signature  Robyne Peers, RN

## 2022-12-27 ENCOUNTER — Telehealth: Payer: Self-pay

## 2022-12-27 NOTE — Transitions of Care (Post Inpatient/ED Visit) (Signed)
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[employer].) Any questions or concerns?: No  Items Reviewed: Did you receive and understand the discharge instructions provided?: Yes Medications obtained,verified, and reconciled?: No Medications Not Reviewed Reasons:: Other: (He said he has all medications and did not have any questions about the med regime or need to review the med list.) Any new allergies since your discharge?: No Dietary orders reviewed?: Yes Type of Diet Ordered:: heart healthy Do you have support at home?: Yes People in Home: spouse  Medications Reviewed Today: Medications Reviewed Today     Reviewed by Vonita Moss, CPhT (Pharmacy Technician) on 12/12/22 at 1755  Med List Status: Complete   Medication Order Taking? Sig Documenting Provider Last Dose Status Informant  amLODipine (NORVASC) 10 MG tablet [id number] Yes TAKE 1 TABLET (10 MG TOTAL) BY MOUTH DAILY.  Patient taking differently: Take 10 mg by mouth daily.   [name], MD 12/12/2022 Active Self, Pharmacy Records           Med Note (COFFELL, Marzella Schlein   Tue Dec 12, 2022  5:52 PM) Pt states he still takes this medication, no fill history found.  Ascorbic Acid (VITAMIN C PO) [id number] Yes Take 1 tablet by mouth daily. [provider] 12/11/2022 Active Self  Cholecalciferol (VITAMIN D-3 PO) [id number] Yes Take 1 capsule by mouth daily. [provider]  12/11/2022 Active Self  colchicine 0.6 MG tablet [id number] No TAKE 1 TABLET BY MOUTH EVERY OTHER DAY ON MONDAY, WEDNESDAY, AND FRIDAY.  Patient not taking: Reported on 12/12/2022   [name], MD Not Taking Active Spouse/Significant Other  diphenhydramine-acetaminophen (TYLENOL PM) 25-500 MG TABS tablet [id number] Yes Take 2 tablets by mouth at bedtime as needed (sleep, pain). [provider] 12/11/2022 Active Self  ELDERBERRY PO [id number] Yes Take 1 capsule by mouth daily. [provider] 12/11/2022 Active Self  ferrous sulfate 325 (65 FE) MG tablet [id number] No Take 1 tablet (325 mg total) by mouth 2 (two) times daily with a meal.  Patient not taking: Reported on 12/12/2022   [name], MD Not Taking Active Spouse/Significant Other  folic acid (FOLVITE) 1 MG tablet [id number] No Take 1 tablet (1 mg total) by mouth daily.  Patient not taking: Reported on 12/12/2022   Osvaldo Shipper, MD Not Taking Active Self  Multiple Vitamin (MULTIVITAMIN WITH MINERALS) TABS tablet [id number] Yes Take 1 tablet by mouth daily. [provider] 12/11/2022 Active Self  Multiple Vitamins-Minerals (ZINC PO) [id number] Yes Take 1 tablet by mouth daily. [provider] 12/11/2022 Active Self  pantoprazole (PROTONIX) 40 MG tablet [id number] No Take 1 tablet (40 mg total) by mouth 2 (two) times daily before a meal.  Patient not taking: Reported on 12/12/2022   Elgergawy, Leana Roe, MD Not Taking Active Self  thiamine (VITAMIN B1) 100 MG tablet [id number] No Take 1 tablet (100 mg total) by mouth daily.  Patient not taking: Reported on 12/12/2022   Elgergawy, Leana Roe, MD Not Taking Active Self  VITAMIN  E PO [id number] Yes Take 1 capsule by mouth daily. [provider] 12/11/2022 Active Self            Home Care and Equipment/Supplies: Were Home Health Services Ordered?: No Any new equipment or medical supplies ordered?: No  Functional Questionnaire: Do you need  assistance with bathing/showering or dressing?: No Do you need assistance with meal preparation?: No Do you need assistance with eating?: No Do you have difficulty maintaining continence: No Do you need assistance with getting out of bed/getting out of a chair/moving?: No Do you have difficulty managing or taking your medications?: No  Follow up appointments reviewed: PCP Follow-up appointment confirmed?: Yes Date of PCP follow-up appointment?: 01/17/23 Follow-up Provider: Dr Andrey Campanile.  the patient wanted an appointment at the end of the day due to his job and Dr Laural Benes did not have an appointment available. the patient said that [facility] is closer to his home. i provided him with the address and phone number for [facility] Hospital Follow-up appointment confirmed?: Yes Date of Specialist follow-up appointment?: 03/19/23 Follow-Up Specialty Provider:: GI Do you need transportation to your follow-up appointment?: No Do you understand care options if your condition(s) worsen?: Yes-patient verbalized understanding    SIGNATURE  Robyne Peers, RN

## 2023-01-17 ENCOUNTER — Inpatient Hospital Stay: Payer: Self-pay | Admitting: Family Medicine

## 2023-03-19 ENCOUNTER — Ambulatory Visit: Payer: Self-pay | Admitting: Internal Medicine

## 2023-03-29 ENCOUNTER — Ambulatory Visit
Admission: EM | Admit: 2023-03-29 | Discharge: 2023-03-29 | Disposition: A | Discharge status: Home / Self Care | Payer: Self-pay | Attending: Family Medicine | Admitting: Family Medicine

## 2023-03-29 ENCOUNTER — Ambulatory Visit (INDEPENDENT_AMBULATORY_CARE_PROVIDER_SITE_OTHER): Payer: Self-pay

## 2023-03-29 DIAGNOSIS — M25572 Pain in left ankle and joints of left foot: Secondary | ICD-10-CM

## 2023-03-29 MED ORDER — KETOROLAC TROMETHAMINE 10 MG PO TABS: 10.0000 mg | ORAL_TABLET | Freq: Four times a day (QID) | ORAL | 0 refills | Status: DC | PRN

## 2023-03-29 MED ORDER — KETOROLAC TROMETHAMINE 30 MG/ML IJ SOLN
30.0000 mg | Freq: Once | INTRAMUSCULAR | Status: AC
  Administered 2023-03-29: 30 mg via INTRAMUSCULAR

## 2023-03-29 NOTE — ED Triage Notes (Signed)
Patient states he came down stairs the wrong way, twisted ankle and can barely walk on left foot. No treatment used.

## 2023-03-29 NOTE — ED Provider Notes (Addendum)
[name]    CSN: [id number] Arrival date & time: 03/29/23  1552      History   Chief Complaint No chief complaint on file.   HPI Stephen Bowers is a 62 y.o. male.   HPI Here for left ankle and foot pain.  This afternoon at work he was coming down some steps any turned his ankle and fell.  He now has pain along the left lateral ankle and lateral left foot.  He denies allergies to medications  He does not take medications daily for chronic conditions. Past Medical History:  Diagnosis Date   Chronic musculoskeletal pain    CKD (chronic kidney disease)    follows with [facility] [name])   High cholesterol Dx 2011   Hypertension    Pancolitis (HCC)    Substance abuse (HCC)    Wears partial dentures    upper only    Patient Active Problem List   Diagnosis Date Noted   Alcohol use 12/13/2022   ABLA (acute blood loss anemia) 12/12/2022   Lactic acidosis 12/12/2022   Acute blood loss anemia 12/12/2022   Hematemesis with nausea 05/23/2022   Alcoholic cirrhosis of liver without ascites (HCC) 05/23/2022   Pneumonia due to COVID-19 virus 05/22/2022   Aspiration pneumonia (HCC) 05/22/2022   Alcoholic gastritis with bleeding 05/22/2022   Lactic acid acidosis 05/22/2022   Chronic liver failure (HCC) 05/22/2022   Gout 05/22/2022   Duodenal ulcer    Hematochezia    Iron deficiency anemia due to chronic blood loss    Symptomatic anemia 07/05/2021   URI (upper respiratory infection) 07/05/2021   Transaminitis 04/23/2020   Alcohol use disorder, severe, dependence (HCC) 04/23/2020   Acute gout of right elbow    Hyponatremia    Right elbow pain    Alcohol abuse    Fever    AMS (altered mental status)    Thrombocytopenia (HCC)    Seizure-like activity (HCC) 04/11/2020   Alcohol withdrawal seizure (HCC) 04/11/2020   Alcohol use disorder, mild, abuse 02/24/2019   Alcoholic fatty liver 02/24/2019   Prediabetes 02/24/2019   Normocytic anemia 02/24/2019    Acute pancreatitis 12/20/2017   Increased anion gap metabolic acidosis    Severe sepsis (HCC)    Abnormal LFTs 12/04/2017   Pancreatitis 12/04/2017   Hyperlipidemia 10/16/2016   AKI (acute kidney injury) (HCC)    HTN (hypertension) 03/02/2015   Vitamin D deficiency 01/19/2015   Low back pain potentially associated with radiculopathy 03/30/2014    Past Surgical History:  Procedure Laterality Date   BIOPSY  07/06/2021   Procedure: BIOPSY;  Surgeon: [name], MD;  Location: [facility];  Service: Endoscopy;;   COLONOSCOPY  12/2014   hx polyps - Pyrtle   ESOPHAGOGASTRODUODENOSCOPY (EGD) WITH PROPOFOL N/A 07/06/2021   Dr [name]. [facility] END[facility]COPY. findings: non-bleeding Duodenal ulcer, gastric erosions. Path: mild chronic gastritis, no H Pylori.   MULTIPLE TOOTH EXTRACTIONS         Home Medications    Prior to Admission medications   Medication Sig Start Date End Date Taking? Authorizing Provider  ketorolac (TORADOL) 10 MG tablet Take 1 tablet (10 mg total) by mouth every 6 (six) hours as needed (pain). 03/29/23  Yes [name], MD  Ascorbic Acid (VITAMIN C PO) Take 1 tablet by mouth daily.    [provider]  Cholecalciferol (VITAMIN D-3 PO) Take 1 capsule by mouth daily.    [provider]  diphenhydramine-acetaminophen (TYLENOL PM) 25-500  MG TABS tablet Take 2 tablets by mouth at bedtime as needed (sleep, pain).    [provider]  ELDERBERRY PO Take 1 capsule by mouth daily.    [provider]  folic acid (FOLVITE) 1 MG tablet Take 1 tablet (1 mg total) by mouth daily. 12/23/22   [name], [name], MD  Multiple Vitamin (MULTIVITAMIN WITH MINERALS) TABS tablet Take 1 tablet by mouth daily. 12/23/22   [name], [name], MD  pantoprazole (PROTONIX) 40 MG tablet Take 1 tablet (40 mg total) by mouth daily. 12/23/22   [name], [name], MD  thiamine (VITAMIN B1) 100 MG tablet Take 1 tablet (100 mg total) by mouth daily. 12/23/22    [name], [name], MD  VITAMIN E PO Take 1 capsule by mouth daily.    [provider]    Family History Family History  Problem Relation Age of Onset   Liver disease Father    Alcohol abuse Father    Diabetes Sister    Obesity Sister    Cancer Mother        breast    Kidney disease Mother    Colon cancer Neg Hx    Esophageal cancer Neg Hx    Rectal cancer Neg Hx    Stomach cancer Neg Hx    Colon polyps Neg Hx     Social History Social History   Tobacco Use   Smoking status: Former    Current packs/day: 0.00    Average packs/day: 0.3 packs/day for 6.0 years (1.5 ttl pk-yrs)    Types: Cigarettes    Start date: 39    Quit date: 1995    Years since quitting: 29.7   Smokeless tobacco: Never  Vaping Use   Vaping status: Never Used  Substance Use Topics   Alcohol use: Yes    Alcohol/week: 6.0 standard drinks of alcohol    Types: 6 Cans of beer per week    Comment: 6 drinks Beer /Liquor   Drug use: No     Allergies   Patient has no known allergies.   Review of Systems Review of Systems   Physical Exam Triage Vital Signs ED Triage Vitals  Encounter Vitals Group     BP 03/29/23 1616 138/86     Systolic BP Percentile --      Diastolic BP Percentile --      Pulse Rate 03/29/23 1616 96     Resp --      Temp 03/29/23 1616 98.1 F (36.7 C)     Temp Source 03/29/23 1616 Oral     SpO2 03/29/23 1616 98 %     Weight 03/29/23 1617 190 lb (86.2 kg)     Height 03/29/23 1617 5\' 11"  (1.803 m)     Head Circumference --      Peak Flow --      Pain Score --      Pain Loc --      Pain Education --      Exclude from Growth Chart --    No data found.  Updated Vital Signs BP 138/86 (BP Location: Left Arm)   Pulse 96   Temp 98.1 F (36.7 C) (Oral)   Ht 5\' 11"  (1.803 m)   Wt 86.2 kg   SpO2 98%   BMI 26.50 kg/m   Visual Acuity Right Eye Distance:   Left Eye Distance:   Bilateral Distance:    Right Eye Near:   Left Eye Near:    Bilateral  Near:      Physical Exam Vitals reviewed.  Constitutional:      General: He is not in acute distress.    Appearance: He is not ill-appearing, toxic-appearing or diaphoretic.  Musculoskeletal:     Comments: There is some mild swelling over the lateral malleolus and the lateral left.  Is also tender in that area.  There is no bruising.  Pulses are normal on the left foot.  Skin:    Coloration: Skin is not jaundiced or pale.  Neurological:     Mental Status: He is alert and oriented to person, place, and time.  Psychiatric:        Behavior: Behavior normal.      UC Treatments / Results  Labs (all labs ordered are listed, but only abnormal results are displayed) Labs Reviewed - No data to display  EKG   Radiology No results found.  Procedures Procedures (including critical care time)  Medications Ordered in UC Medications  ketorolac (TORADOL) 30 MG/ML injection 30 mg (has no administration in time range)    Initial Impression / Assessment and Plan / UC Course  I have reviewed the triage vital signs and the nursing notes.  Pertinent labs & imaging results that were available during my care of the patient were reviewed by me and considered in my medical decision making (see chart for details).        X-ray by my review does not show an acute fracture. He is advised of radiology overread.  He is placed in a boot, and provided crutches.  Toradol injection is given here and Toradol tablets are sent to the pharmacy.  He will ice and elevate it tonight.  He was given contact information for foot and ankle specialists  Radiology reading received and they saw a possible cortical irregularity on the medial side of the talus and question whether or not there was an avulsion fracture.  Of note this is not where the patient was hurting.  He was hurting on the lateral side of his ankle and lateral foot.  I did call and talk with him about this report, and have asked him to make sure he  follows up with the orthopedic specialist.  I do not think that irregularity of the medial talus is an acute finding, since he is not hurting there. Final Clinical Impressions(s) / UC Diagnoses   Final diagnoses:  Acute left ankle pain     Discharge Instructions      I do not see any fractures on your x-rays. The radiologist will also read your x-ray, and if their interpretation differs significantly from mine, we will call you.  You have been given a shot of Toradol 30 mg today.  Ketorolac 10 mg tablets--take 1 tablet every 6 hours as needed for pain.  This is the same medicine that is in the shot we just gave you  Ice and elevate your ankle and foot tonight and tomorrow.     ED Prescriptions     Medication Sig Dispense Auth. Provider   ketorolac (TORADOL) 10 MG tablet Take 1 tablet (10 mg total) by mouth every 6 (six) hours as needed (pain). 20 tablet Zykeem Bauserman, Janace Aris, MD      PDMP not reviewed this encounter.   [name], MD 03/29/23 1700    [name], MD 03/29/23 [id number]

## 2023-03-29 NOTE — Discharge Instructions (Signed)
I do not see any fractures on your x-rays. The radiologist will also read your x-ray, and if their interpretation differs significantly from mine, we will call you.  You have been given a shot of Toradol 30 mg today.  Ketorolac 10 mg tablets--take 1 tablet every 6 hours as needed for pain.  This is the same medicine that is in the shot we just gave you  Ice and elevate your ankle and foot tonight and tomorrow.

## 2023-05-15 ENCOUNTER — Ambulatory Visit: Payer: Self-pay

## 2023-05-15 ENCOUNTER — Ambulatory Visit: Payer: Self-pay | Attending: Family Medicine | Admitting: Family Medicine

## 2023-05-15 ENCOUNTER — Encounter: Payer: Self-pay | Admitting: Family Medicine

## 2023-05-15 ENCOUNTER — Other Ambulatory Visit: Payer: Self-pay

## 2023-05-15 VITALS — BP 121/74 | HR 88 | Ht 71.0 in | Wt 187.4 lb

## 2023-05-15 DIAGNOSIS — Z23 Encounter for immunization: Secondary | ICD-10-CM

## 2023-05-15 DIAGNOSIS — R42 Dizziness and giddiness: Secondary | ICD-10-CM

## 2023-05-15 DIAGNOSIS — R0609 Other forms of dyspnea: Secondary | ICD-10-CM

## 2023-05-15 MED ORDER — MECLIZINE HCL 25 MG PO TABS
25.0000 mg | ORAL_TABLET | Freq: Three times a day (TID) | ORAL | 0 refills | Status: DC | PRN
  Filled 2023-05-15: qty 60, 20d supply, fill #0

## 2023-05-15 NOTE — Patient Instructions (Signed)
Dizziness Dizziness is a common problem. It makes you feel unsteady or light-headed. You may feel like you're about to faint. Dizziness can lead to getting hurt if you stumble or fall. It's more common to feel dizzy if you're an older adult. Many things can cause you to feel dizzy. These include: Medicines. Dehydration. This is when there's not enough water in your body. Illness. Follow these instructions at home: Eating and drinking  Drink enough fluid to keep your pee (urine) pale yellow. This helps keep you from getting dehydrated. Try to drink more clear fluids, such as water. Do not drink alcohol. Try to limit how much caffeine you take in. Try to limit how much salt, also called sodium, you take in. Activity Try not to make quick movements. Stand up slowly from sitting in a chair. Steady yourself until you feel okay. In the morning, first sit up on the side of the bed. When you feel okay, hold onto something and slowly stand up. Do this until you know that your balance is okay. If you need to stand in one place for a long time, move your legs often. Tighten and relax the muscles in your legs while you're standing. Do not drive or use machines if you feel dizzy. Avoid bending down if you feel dizzy. Place items in your home so you can reach them without leaning over. Lifestyle Do not smoke, vape, or use products with nicotine or tobacco in them. If you need help quitting, talk with your health care provider. Try to lower your stress level. You can do this by using methods like yoga or meditation. Talk with your provider if you need help. General instructions Watch your dizziness for any changes. Take your medicines only as told by your provider. Talk with your provider if you think you're dizzy because of a medicine you're taking. Tell a friend or a family member that you're feeling dizzy. If they spot any changes in your behavior, have them call your provider. Contact a health care  provider if: Your dizziness doesn't go away, or you have new symptoms. Your dizziness gets worse. You feel like you may vomit. You have trouble hearing. You have a fever. You have neck pain or a stiff neck. You fall or get hurt. Get help right away if: You vomit each time you eat or drink. You have watery poop and can't eat or drink. You have trouble talking, walking, swallowing, or using your arms, hands, or legs. You feel very weak. You're bleeding. You're not thinking clearly, or you have trouble forming sentences. A friend or family member may spot this. Your vision changes, or you get a very bad headache. These symptoms may be an emergency. Call 911 right away. Do not wait to see if the symptoms will go away. Do not drive yourself to the hospital. This information is not intended to replace advice given to you by your health care provider. Make sure you discuss any questions you have with your health care provider. Document Revised: 08/03/2022 Document Reviewed: 08/03/2022 Elsevier Patient Education  2024 [employer].

## 2023-05-15 NOTE — Telephone Encounter (Signed)
Chief Complaint: Dizzy Symptoms: Mild to moderate dizziness, lightheaded, Staring off into space, mild SOB Frequency: Comes and goes Pertinent Negatives: Patient denies chest pain, fever, urinary symptoms Disposition: [] ED /[] Urgent Care (no appt availability in office) / [x] Appointment(In office/virtual)/ []  [facility]/ [] Home Care/ [] Refused Recommended Disposition /[] [facility]/ []  Follow-up with PCP Additional Notes: Spoke to patient's wife (DPR on File) who stated the patient has been complaining of dizziness and being lightheaded since Friday.  Elnita Maxwell stated she thought she was going to take him to the ED but patient went to work this morning and asked her to contact his PCP office for an appointment. Elnita Maxwell stated the last time patient had symptoms like these his blood pressure was elevated but she does not have a blood pressure monitor this time to check it. Care advice was given and patient has been scheduled for an appointment today at 1330. Advised if patient can not make to callback to rescheduled. Elnita Maxwell verbalized understanding.  Reason for Disposition  [1] MODERATE dizziness (e.g., interferes with normal activities) AND [2] has NOT been evaluated by doctor (or NP/PA) for this  (Exception: Dizziness caused by heat exposure, sudden standing, or poor fluid intake.)  Answer Assessment - Initial Assessment Questions 1. DESCRIPTION: "Describe your dizziness."     Lightheaded  2. LIGHTHEADED: "Do you feel lightheaded?" (e.g., somewhat faint, woozy, weak upon standing)     Woozy 3. VERTIGO: "Do you feel like either you or the room is spinning or tilting?" (i.e. vertigo)     No  4. SEVERITY: "How bad is it?"  "Do you feel like you are going to faint?" "Can you stand and walk?"   - MILD: Feels slightly dizzy, but walking normally.   - MODERATE: Feels unsteady when walking, but not falling; interferes with normal activities (e.g., school, work).   - SEVERE: Unable to  walk without falling, or requires assistance to walk without falling; feels like passing out now.      Mild to Moderate  5. ONSET:  "When did the dizziness begin?"     Friday  6. AGGRAVATING FACTORS: "Does anything make it worse?" (e.g., standing, change in head position)     Standing  7. HEART RATE: "Can you tell me your heart rate?" "How many beats in 15 seconds?"  (Note: not all patients can do this)       He is not here  8. CAUSE: "What do you think is causing the dizziness?"     Maybe his blood pressure is elevated  9. RECURRENT SYMPTOM: "Have you had dizziness before?" If Yes, ask: "When was the last time?" "What happened that time?"     Yes, he went to the hospital last time  10. OTHER SYMPTOMS: "Do you have any other symptoms?" (e.g., fever, chest pain, vomiting, diarrhea, bleeding)       Lightheadedness, staring off, mild SOB  Protocols used: Dizziness - Lightheadedness-A-AH

## 2023-05-15 NOTE — Progress Notes (Signed)
Subjective:  Patient ID: Stephen Bowers, male    DOB: 11-24-1960  Age: 62 y.o. MRN: [id number]  CC: Dizziness   HPI Stephen Bowers is a 4 y.o. year old male patient of Dr. Laural Benes with a history of alcoholic liver disease here for an acute visit, hypertension (diet-controlled)  Interval History: Discussed the use of AI scribe software for clinical note transcription with the patient, who gave verbal consent to proceed.  The patient, with a history of hypertension, presents with a two-week history of dizziness. The dizziness is most pronounced in the mornings and occasionally at work, especially when moving quickly. He denies associated tinnitus, headache, nausea, or vomiting. He was previously on antihypertensive medication, but it was discontinued during a recent hospital admission. The patient believes he may need to restart the medication, but his blood pressure is currently within normal limits.  The lady who accompanies him is wondering if the fact that he has mold in his apartment could be contributing to the dizziness as he has had some shortness of breath on some occasions.  His oxygen saturation is 100%. He endorses an inadequate intake of fluids.       Past Medical History:  Diagnosis Date   Chronic musculoskeletal pain    CKD (chronic kidney disease)    follows with [facility] [name])   High cholesterol Dx 2011   Hypertension    Pancolitis (HCC)    Substance abuse (HCC)    Wears partial dentures    upper only    Past Surgical History:  Procedure Laterality Date   BIOPSY  07/06/2021   Procedure: BIOPSY;  Surgeon: [name], MD;  Location: [facility];  Service: Endoscopy;;   COLONOSCOPY  12/2014   hx polyps - Pyrtle   ESOPHAGOGASTRODUODENOSCOPY (EGD) WITH PROPOFOL N/A 07/06/2021   Dr [name]. [facility] END[facility]COPY. findings: non-bleeding Duodenal ulcer, gastric erosions. Path: mild chronic gastritis, no H Pylori.   MULTIPLE TOOTH EXTRACTIONS      Family  History  Problem Relation Age of Onset   Liver disease Father    Alcohol abuse Father    Diabetes Sister    Obesity Sister    Cancer Mother        breast    Kidney disease Mother    Colon cancer Neg Hx    Esophageal cancer Neg Hx    Rectal cancer Neg Hx    Stomach cancer Neg Hx    Colon polyps Neg Hx     Social History   Socioeconomic History   Marital status: Married    Spouse name: Elnita Maxwell   Number of children: 1    Years of education: 12    Highest education level: Not on file  Occupational History   Occupation: Unemployed   Tobacco Use   Smoking status: Former    Current packs/day: 0.00    Average packs/day: 0.3 packs/day for 6.0 years (1.5 ttl pk-yrs)    Types: Cigarettes    Start date: 36    Quit date: 1995    Years since quitting: 29.8   Smokeless tobacco: Never  Vaping Use   Vaping status: Never Used  Substance and Sexual Activity   Alcohol use: Yes    Alcohol/week: 6.0 standard drinks of alcohol    Types: 6 Cans of beer per week    Comment: 6 drinks Beer /Liquor   Drug use: No   Sexual activity: Yes  Other Topics Concern   Not on file  Social  History Narrative   Lives in an [place], family house.    Child goes to [employer] in [place], 77 yo F,          Social Determinants of Health   Financial Resource Strain: Not on file  Food Insecurity: No Food Insecurity (12/12/2022)   Hunger Vital Sign    Worried About Running Out of Food in the Last Year: Never true    Ran Out of Food in the Last Year: Never true  Transportation Needs: No Transportation Needs (12/12/2022)   PRAPARE - Administrator, Civil Service (Medical): No    Lack of Transportation (Non-Medical): No  Physical Activity: Not on file  Stress: Not on file  Social Connections: Not on file    No Known Allergies  Outpatient Medications Prior to Visit  Medication Sig Dispense Refill   Ascorbic Acid (VITAMIN C PO) Take 1 tablet by mouth daily. (Patient not taking: Reported on  05/15/2023)     Cholecalciferol (VITAMIN D-3 PO) Take 1 capsule by mouth daily. (Patient not taking: Reported on 05/15/2023)     diphenhydramine-acetaminophen (TYLENOL PM) 25-500 MG TABS tablet Take 2 tablets by mouth at bedtime as needed (sleep, pain). (Patient not taking: Reported on 05/15/2023)     ELDERBERRY PO Take 1 capsule by mouth daily. (Patient not taking: Reported on 05/15/2023)     folic acid (FOLVITE) 1 MG tablet Take 1 tablet (1 mg total) by mouth daily. (Patient not taking: Reported on 05/15/2023) 30 tablet 0   ketorolac (TORADOL) 10 MG tablet Take 1 tablet (10 mg total) by mouth every 6 (six) hours as needed (pain). (Patient not taking: Reported on 05/15/2023) 20 tablet 0   Multiple Vitamin (MULTIVITAMIN WITH MINERALS) TABS tablet Take 1 tablet by mouth daily. (Patient not taking: Reported on 05/15/2023) 30 tablet 0   pantoprazole (PROTONIX) 40 MG tablet Take 1 tablet (40 mg total) by mouth daily. (Patient not taking: Reported on 05/15/2023) 30 tablet 1   thiamine (VITAMIN B1) 100 MG tablet Take 1 tablet (100 mg total) by mouth daily. (Patient not taking: Reported on 05/15/2023) 30 tablet 0   VITAMIN E PO Take 1 capsule by mouth daily. (Patient not taking: Reported on 05/15/2023)     No facility-administered medications prior to visit.     ROS Review of Systems  Constitutional:  Negative for activity change and appetite change.  HENT:  Negative for sinus pressure and sore throat.   Respiratory:  Positive for shortness of breath. Negative for chest tightness and wheezing.   Cardiovascular:  Negative for chest pain and palpitations.  Gastrointestinal:  Negative for abdominal distention, abdominal pain and constipation.  Genitourinary: Negative.   Musculoskeletal: Negative.   Neurological:  Positive for dizziness.  Psychiatric/Behavioral:  Negative for behavioral problems and dysphoric mood.     Objective:  BP 121/74   Pulse 88   Ht 5\' 11"  (1.803 m)   Wt 187 lb 6.4 oz (85  kg)   SpO2 100%   BMI 26.14 kg/m      05/15/2023    2:12 PM 03/29/2023    4:17 PM 03/29/2023    4:16 PM  BP/Weight  Systolic BP 121  295  Diastolic BP 74  86  Wt. (Lbs) 187.4 190   BMI 26.14 kg/m2 26.5 kg/m2       Physical Exam Constitutional:      Appearance: He is well-developed.  HENT:     Ears:     Comments: Negative Dix-Hallpike maneuver  Cardiovascular:     Rate and Rhythm: Normal rate.     Heart sounds: Normal heart sounds. No murmur heard. Pulmonary:     Effort: Pulmonary effort is normal.     Breath sounds: Normal breath sounds. No wheezing or rales.  Chest:     Chest wall: No tenderness.  Abdominal:     General: Bowel sounds are normal. There is no distension.     Palpations: Abdomen is soft. There is no mass.     Tenderness: There is no abdominal tenderness.  Musculoskeletal:        General: Normal range of motion.     Right lower leg: No edema.     Left lower leg: No edema.  Neurological:     Mental Status: He is alert and oriented to person, place, and time.  Psychiatric:        Mood and Affect: Mood normal.        Latest Ref Rng & Units 12/19/2022    3:36 AM 12/18/2022    3:31 AM 12/17/2022    3:36 AM  CMP  Glucose 70 - 99 mg/dL 160  737  106   BUN 6 - 20 mg/dL 7  6  8    Creatinine 0.61 - 1.24 mg/dL 2.69  4.85  4.62   Sodium 135 - 145 mmol/L 132  131  131   Potassium 3.5 - 5.1 mmol/L 3.8  3.3  3.8   Chloride 98 - 111 mmol/L 102  99  101   CO2 22 - 32 mmol/L 19  21  22    Calcium 8.9 - 10.3 mg/dL 8.7  9.0  8.7   Total Protein 6.5 - 8.1 g/dL 7.0  7.2  6.3   Total Bilirubin 0.3 - 1.2 mg/dL 0.7  0.8  0.9   Alkaline Phos 38 - 126 U/L 32  34  30   AST 15 - 41 U/L 77  87  116   ALT 0 - 44 U/L 37  42  41     Lipid Panel     Component Value Date/Time   CHOL 293 (H) 04/12/2020 0646   TRIG 82 05/22/2022 0509   HDL 95 04/12/2020 0646   CHOLHDL 3.1 04/12/2020 0646   VLDL 17 04/12/2020 0646   LDLCALC 181 (H) 04/12/2020 0646    CBC     Component Value Date/Time   WBC 10.0 12/20/2022 0310   RBC 3.29 (L) 12/20/2022 0310   HGB 9.5 (L) 12/20/2022 0310   HGB 11.3 (L) 04/22/2020 1508   HCT 29.1 (L) 12/20/2022 0310   HCT 32.9 (L) 04/22/2020 1508   PLT 125 (L) 12/20/2022 0310   PLT 607 (H) 02/24/2019 1028   MCV 88.4 12/20/2022 0310   MCV 96 04/22/2020 1508   MCH 28.9 12/20/2022 0310   MCHC 32.6 12/20/2022 0310   RDW 18.3 (H) 12/20/2022 0310   RDW 13.1 04/22/2020 1508   LYMPHSABS 1.4 12/18/2022 0331   LYMPHSABS 1.5 04/22/2020 1508   MONOABS 2.2 (H) 12/18/2022 0331   EOSABS 0.2 12/18/2022 0331   EOSABS 0.2 04/22/2020 1508   BASOSABS 0.1 12/18/2022 0331   BASOSABS 0.1 04/22/2020 1508    Lab Results  Component Value Date   HGBA1C 5.9 (H) 07/06/2021    Assessment & Plan:      Dizziness New onset, primarily in the morning and with quick movements. No associated tinnitus, headache, nausea, or vomiting. Normal blood pressure. Possible causes discussed include anemia, vertigo, and dehydration. -Order blood  work to check for anemia as last hemoglobin was 9.5 in 12/2022. -Prescribe Meclizine as needed for potential vertigo. -Blood pressure is normal also hypotension has been ruled out as a possible cause -Advise to increase fluid intake for hydration. -Explained that I do not think the presence of mold would explain his dizziness -If symptoms persist, consider further evaluation with imaging.   Shortness of Breath Mentioned by companion, not patient. No further details provided. -Oxygen saturation is normal -Needs further evaluation, will have him schedule appointment with PCP for this.         Meds ordered this encounter  Medications   meclizine (ANTIVERT) 25 MG tablet    Sig: Take 1 tablet (25 mg total) by mouth 3 (three) times daily as needed for dizziness.    Dispense:  60 tablet    Refill:  0    Follow-up: Return in about 1 month (around 06/14/2023) for Follow-up on dizziness with PCP.        [name], MD, FAAFP. [facility] [place], [place] [phone]   05/15/2023, 2:36 PM

## 2023-05-16 LAB — CBC WITH DIFFERENTIAL/PLATELET
Basophils Absolute: 0.2 10*3/uL (ref 0.0–0.2)
Basos: 2 %
EOS (ABSOLUTE): 0.1 10*3/uL (ref 0.0–0.4)
Eos: 1 %
Hematocrit: 18.2 % — ABNORMAL LOW (ref 37.5–51.0)
Hemoglobin: 5.6 g/dL — CL (ref 13.0–17.7)
Immature Grans (Abs): 0 10*3/uL (ref 0.0–0.1)
Immature Granulocytes: 0 %
Lymphocytes Absolute: 1.8 10*3/uL (ref 0.7–3.1)
Lymphs: 24 %
MCH: 25.8 pg — ABNORMAL LOW (ref 26.6–33.0)
MCHC: 30.8 g/dL — ABNORMAL LOW (ref 31.5–35.7)
MCV: 84 fL (ref 79–97)
Monocytes Absolute: 1 10*3/uL — ABNORMAL HIGH (ref 0.1–0.9)
Monocytes: 13 %
Neutrophils Absolute: 4.6 10*3/uL (ref 1.4–7.0)
Neutrophils: 60 %
Platelets: 121 10*3/uL — ABNORMAL LOW (ref 150–450)
RBC: 2.17 x10E6/uL — CL (ref 4.14–5.80)
RDW: 20.7 % — ABNORMAL HIGH (ref 11.6–15.4)
WBC: 7.7 10*3/uL (ref 3.4–10.8)

## 2023-05-16 NOTE — Progress Notes (Addendum)
Patient with patient. Advised per PCP "  he is severely anemic and needs to go to the hospital as he needs a transfusion.  This anemia explains why he has been dizzy and has been short of breath.  Patient voiced that he is going to [facility] ER.

## 2023-05-22 ENCOUNTER — Emergency Department (HOSPITAL_COMMUNITY)
Admission: EM | Admit: 2023-05-22 | Discharge: 2023-05-22 | Disposition: A | Discharge status: Home / Self Care | Payer: Self-pay | Attending: Emergency Medicine | Admitting: Emergency Medicine

## 2023-05-22 ENCOUNTER — Ambulatory Visit: Payer: Self-pay | Admitting: *Deleted

## 2023-05-22 ENCOUNTER — Other Ambulatory Visit: Payer: Self-pay

## 2023-05-22 ENCOUNTER — Telehealth: Payer: Self-pay

## 2023-05-22 ENCOUNTER — Encounter (HOSPITAL_COMMUNITY): Payer: Self-pay | Admitting: Emergency Medicine

## 2023-05-22 DIAGNOSIS — D649 Anemia, unspecified: Secondary | ICD-10-CM | POA: Insufficient documentation

## 2023-05-22 LAB — COMPREHENSIVE METABOLIC PANEL
ALT: 37 U/L (ref 0–44)
AST: 94 U/L — ABNORMAL HIGH (ref 15–41)
Albumin: 3.7 g/dL (ref 3.5–5.0)
Alkaline Phosphatase: 26 U/L — ABNORMAL LOW (ref 38–126)
Anion gap: 14 (ref 5–15)
BUN: 9 mg/dL (ref 6–20)
CO2: 17 mmol/L — ABNORMAL LOW (ref 22–32)
Calcium: 8.5 mg/dL — ABNORMAL LOW (ref 8.9–10.3)
Chloride: 104 mmol/L (ref 98–111)
Creatinine, Ser: 0.82 mg/dL (ref 0.61–1.24)
GFR, Estimated: >60 mL/min (ref >60)
Glucose, Bld: 159 mg/dL — ABNORMAL HIGH (ref 70–99)
Potassium: 3.5 mmol/L (ref 3.5–5.1)
Sodium: 135 mmol/L (ref 135–145)
Total Bilirubin: 0.3 mg/dL (ref <1.2)
Total Protein: 7.8 g/dL (ref 6.5–8.1)

## 2023-05-22 LAB — PREPARE RBC (CROSSMATCH)

## 2023-05-22 LAB — CBC
HCT: 17.2 % — ABNORMAL LOW (ref 39.0–52.0)
Hemoglobin: 5.2 g/dL — CL (ref 13.0–17.0)
MCH: 24.9 pg — ABNORMAL LOW (ref 26.0–34.0)
MCHC: 30.2 g/dL (ref 30.0–36.0)
MCV: 82.3 fL (ref 80.0–100.0)
Platelets: 275 10*3/uL (ref 150–400)
RBC: 2.09 MIL/uL — ABNORMAL LOW (ref 4.22–5.81)
RDW: 22.5 % — ABNORMAL HIGH (ref 11.5–15.5)
WBC: 6.8 10*3/uL (ref 4.0–10.5)
nRBC: 1 % — ABNORMAL HIGH (ref 0.0–0.2)

## 2023-05-22 LAB — I-STAT CHEM 8, ED
BUN: 7 mg/dL (ref 6–20)
Calcium, Ion: 0.95 mmol/L — ABNORMAL LOW (ref 1.15–1.40)
Chloride: 105 mmol/L (ref 98–111)
Creatinine, Ser: 1 mg/dL (ref 0.61–1.24)
Glucose, Bld: 159 mg/dL — ABNORMAL HIGH (ref 70–99)
HCT: 20 % — ABNORMAL LOW (ref 39.0–52.0)
Hemoglobin: 6.8 g/dL — CL (ref 13.0–17.0)
Potassium: 3.9 mmol/L (ref 3.5–5.1)
Sodium: 137 mmol/L (ref 135–145)
TCO2: 20 mmol/L — ABNORMAL LOW (ref 22–32)

## 2023-05-22 MED ORDER — SODIUM CHLORIDE 0.9% IV SOLUTION: Freq: Once | INTRAVENOUS | Status: AC

## 2023-05-22 MED ORDER — SODIUM CHLORIDE 0.9% IV SOLUTION: Freq: Once | INTRAVENOUS | Status: DC

## 2023-05-22 NOTE — ED Triage Notes (Signed)
Patient arrives ambulatory by POV c/o low hemoglobin. Patient states he had labs drawn last Tuesday and was supposed to come to ED after receiving call from nurse.

## 2023-05-22 NOTE — Telephone Encounter (Signed)
  Chief Complaint: dizziness low HBG, per pt wife, told to call back when patient arrived at ED.  Symptoms: dizziness, HBG 5.6 on 05/16/23 Frequency: na Pertinent Negatives: Patient denies na Disposition: [x] ED /[] Urgent Care (no appt availability in office) / [] Appointment(In office/virtual)/ []  [facility]/ [] Home Care/ [] Refused Recommended Disposition /[]  Mobile Bus/ []  Follow-up with PCP Additional Notes:   Patient and wife calling to report they were at [facility]- ED to report in for blood transfusion per PCP and C. Manson Passey, RN message on 05/16/23. Reviewed with wife and pt that message was meant to go to ED on 05/16/23. Wife reports patient would not go then . Continues to be dizzy. Recommended patient go into ED and have blood recollected and will possibly need blood transfusion and evaluate dizziness in ED.  Patient and wife agreed to go in to ED.        Reason for Disposition  [1] Follow-up call to recent contact AND [2] information only call, no triage required  Patient sounds very sick or weak to the triager  Answer Assessment - Initial Assessment Questions 1. REASON FOR CALL or QUESTION: "What is your reason for calling today?" or "How can I best help you?" or "What question do you have that I can help answer?"     Reports was told to call back when patient arrived at hospital for blood transfusion for anemia. Stephen Bowers , RN called patient on 05/16/23 and patient's wife reports patient just now willing to go .  Answer Assessment - Initial Assessment Questions 1. DESCRIPTION: "Describe your dizziness."     Still reports feeling dizzy from low HBG on 05/16/23 2. LIGHTHEADED: "Do you feel lightheaded?" (e.g., somewhat faint, woozy, weak upon standing)     na 3. VERTIGO: "Do you feel like either you or the room is spinning or tilting?" (i.e. vertigo)     na 4. SEVERITY: "How bad is it?"  "Do you feel like you are going to faint?" "Can you stand and walk?"    - MILD: Feels slightly dizzy, but walking normally.   - MODERATE: Feels unsteady when walking, but not falling; interferes with normal activities (e.g., school, work).   - SEVERE: Unable to walk without falling, or requires assistance to walk without falling; feels like passing out now.      na 5. ONSET:  "When did the dizziness begin?"     na 6. AGGRAVATING FACTORS: "Does anything make it worse?" (e.g., standing, change in head position)     na 7. HEART RATE: "Can you tell me your heart rate?" "How many beats in 15 seconds?"  (Note: not all patients can do this)       na 8. CAUSE: "What do you think is causing the dizziness?"     Low HBG 5.6 05/16/23 9. RECURRENT SYMPTOM: "Have you had dizziness before?" If Yes, ask: "When was the last time?" "What happened that time?"     Yes  10. OTHER SYMPTOMS: "Do you have any other symptoms?" (e.g., fever, chest pain, vomiting, diarrhea, bleeding)       Dizzy  11. PREGNANCY: "Is there any chance you are pregnant?" "When was your last menstrual period?"       Na  Protocols used: Information Only Call - No Triage-A-AH, Dizziness - Lightheadedness-A-AH

## 2023-05-22 NOTE — Discharge Instructions (Signed)
Follow up with your physician

## 2023-05-22 NOTE — ED Provider Notes (Signed)
10:06 PM Patient has received 2 units of blood, has had no reaction, is hemodynamically unremarkable, discharged, to follow-up with primary care.   Gerhard Munch, MD 05/22/23 2206

## 2023-05-22 NOTE — Telephone Encounter (Signed)
Call received for patient Wife. Patient is just headed to ER. Patient was advise to go to ED on 05/15/2023. Asked patient's wife what why did they wait so long to go to the ED. Wife voiced  the patient would not go. She just talked him in to going.  Patient is in the ED now.

## 2023-05-22 NOTE — ED Provider Notes (Signed)
[place] EMERGENCY DEPARTMENT AT [facility] Provider Note   CSN: [id number] Arrival date & time: 05/22/23  1324     History  Chief Complaint  Patient presents with   Abnormal Lab    Hgb low    Stephen Bowers is a 62 y.o. male.  62 yo M with a chief complaints of symptomatic anemia.  He denies any dark stool or blood in his stool.  Has a history of bright red blood per rectum but this was many months ago.  THe had seen his family doctor because he was not feeling well and they obtained blood work.  Suggested that he had a hemoglobin of 5.6.  He was then encouraged to come here for evaluation.  He has been feeling fatigued and lightheaded especially when getting up and moving around.  Denies abdominal pain denies vomiting.   Abnormal Lab      Home Medications Prior to Admission medications   Medication Sig Start Date End Date Taking? Authorizing Provider  Ascorbic Acid (VITAMIN C PO) Take 1 tablet by mouth daily. Patient not taking: Reported on 05/15/2023    [provider]  Cholecalciferol (VITAMIN D-3 PO) Take 1 capsule by mouth daily. Patient not taking: Reported on 05/15/2023    [provider]  diphenhydramine-acetaminophen (TYLENOL PM) 25-500 MG TABS tablet Take 2 tablets by mouth at bedtime as needed (sleep, pain). Patient not taking: Reported on 05/15/2023    [provider]  ELDERBERRY PO Take 1 capsule by mouth daily. Patient not taking: Reported on 05/15/2023    [provider]  folic acid (FOLVITE) 1 MG tablet Take 1 tablet (1 mg total) by mouth daily. Patient not taking: Reported on 05/15/2023 12/23/22   Maretta Bees, MD  ketorolac (TORADOL) 10 MG tablet Take 1 tablet (10 mg total) by mouth every 6 (six) hours as needed (pain). Patient not taking: Reported on 05/15/2023 03/29/23   [name], MD  meclizine (ANTIVERT) 25 MG tablet Take 1 tablet (25 mg total) by mouth 3 (three) times daily as needed for  dizziness. 05/15/23   [name], MD  Multiple Vitamin (MULTIVITAMIN WITH MINERALS) TABS tablet Take 1 tablet by mouth daily. Patient not taking: Reported on 05/15/2023 12/23/22   Maretta Bees, MD  pantoprazole (PROTONIX) 40 MG tablet Take 1 tablet (40 mg total) by mouth daily. Patient not taking: Reported on 05/15/2023 12/23/22   Maretta Bees, MD  thiamine (VITAMIN B1) 100 MG tablet Take 1 tablet (100 mg total) by mouth daily. Patient not taking: Reported on 05/15/2023 12/23/22   Maretta Bees, MD  VITAMIN E PO Take 1 capsule by mouth daily. Patient not taking: Reported on 05/15/2023    [provider]      Allergies    Patient has no known allergies.    Review of Systems   Review of Systems  Physical Exam Updated Vital Signs BP 115/72   Pulse 96   Temp 97.9 F (36.6 C) (Oral)   Resp 18   Ht 5\' 11"  (1.803 m)   Wt 85 kg   SpO2 99%   BMI 26.14 kg/m  Physical Exam Vitals and nursing note reviewed.  Constitutional:      Appearance: He is well-developed.     Comments: Marked pallor  HENT:     Head: Normocephalic and atraumatic.  Eyes:     Pupils: Pupils are equal, round, and reactive to light.  Neck:     Vascular:  No JVD.  Cardiovascular:     Rate and Rhythm: Normal rate and regular rhythm.     Heart sounds: No murmur heard.    No friction rub. No gallop.  Pulmonary:     Effort: No respiratory distress.     Breath sounds: No wheezing.  Abdominal:     General: There is no distension.     Tenderness: There is no abdominal tenderness. There is no guarding or rebound.  Musculoskeletal:        General: Normal range of motion.     Cervical back: Normal range of motion and neck supple.  Skin:    Coloration: Skin is not pale.     Findings: No rash.  Neurological:     Mental Status: He is alert and oriented to person, place, and time.  Psychiatric:        Behavior: Behavior normal.     ED Results / Procedures / Treatments   Labs (all  labs ordered are listed, but only abnormal results are displayed) Labs Reviewed  COMPREHENSIVE METABOLIC PANEL - Abnormal; Notable for the following components:      Result Value   CO2 17 (*)    Glucose, Bld 159 (*)    Calcium 8.5 (*)    AST 94 (*)    Alkaline Phosphatase 26 (*)    All other components within normal limits  CBC - Abnormal; Notable for the following components:   RBC 2.09 (*)    Hemoglobin 5.2 (*)    HCT 17.2 (*)    MCH 24.9 (*)    RDW 22.5 (*)    nRBC 1.0 (*)    All other components within normal limits  I-STAT CHEM 8, ED - Abnormal; Notable for the following components:   Glucose, Bld 159 (*)    Calcium, Ion 0.95 (*)    TCO2 20 (*)    Hemoglobin 6.8 (*)    HCT 20.0 (*)    All other components within normal limits  POC OCCULT BLOOD, ED  TYPE AND SCREEN  PREPARE RBC (CROSSMATCH)  PREPARE RBC (CROSSMATCH)    EKG None  Radiology No results found.  Procedures .Critical Care  Performed by: Melene Plan, DO Authorized by: Melene Plan, DO   Critical care provider statement:    Critical care time (minutes):  35   Critical care time was exclusive of:  Separately billable procedures and treating other patients   Critical care was time spent personally by me on the following activities:  Development of treatment plan with patient or surrogate, discussions with consultants, evaluation of patient's response to treatment, examination of patient, ordering and review of laboratory studies, ordering and review of radiographic studies, ordering and performing treatments and interventions, pulse oximetry, re-evaluation of patient's condition and review of old charts   Care discussed with: admitting provider       Medications Ordered in ED Medications  0.9 %  sodium chloride infusion (Manually program via Guardrails IV Fluids) (has no administration in time range)  0.9 %  sodium chloride infusion (Manually program via Guardrails IV Fluids) (has no administration in time  range)    ED Course/ Medical Decision Making/ A&P                                 Medical Decision Making Amount and/or Complexity of Data Reviewed Labs: ordered.  Risk Prescription drug management.   62 yo M with a  chief complaints of symptomatic anemia.  Going on for a couple days but has resolved a couple days ago.   Saw his family doctor.  Blood work drawn.  Hemoglobin less than 7.  Hemoglobin here 5.2.  Will transfuse 2 units.  I discussed the results with the patient.  As were not sure the cause of bleeding I did encourage him to stay in the hospital.  He is currently declining.  Would like to follow-up as an outpatient with his gastroenterologist.  Encouraged him to return anytime he changes mind or would like repeat evaluation.  Signed out to Dr. Jeraldine Loots, please see their note for further details of care in the ED.  The patients results and plan were reviewed and discussed.   Any x-rays performed were independently reviewed by myself.   Differential diagnosis were considered with the presenting HPI.  Medications  0.9 %  sodium chloride infusion (Manually program via Guardrails IV Fluids) (has no administration in time range)  0.9 %  sodium chloride infusion (Manually program via Guardrails IV Fluids) (has no administration in time range)    Vitals:   05/22/23 1330 05/22/23 1332 05/22/23 1340  BP:   115/72  Pulse:  96   Resp:  18   Temp:  97.9 F (36.6 C)   TempSrc:  Oral   SpO2:  99%   Weight: 85 kg    Height: 5\' 11"  (1.803 m)      Final diagnoses:  Symptomatic anemia            Final Clinical Impression(s) / ED Diagnoses Final diagnoses:  Symptomatic anemia    Rx / DC Orders ED Discharge Orders     None         Melene Plan, DO 05/22/23 1513

## 2023-05-23 ENCOUNTER — Encounter: Payer: Self-pay | Admitting: Internal Medicine

## 2023-05-23 LAB — TYPE AND SCREEN
ABO/RH(D): A POS
Antibody Screen: NEGATIVE
Unit division: 0
Unit division: 0

## 2023-05-23 LAB — BPAM RBC
Blood Product Expiration Date: 202412132359
Blood Product Expiration Date: 202412132359
ISSUE DATE / TIME: 202411191540
ISSUE DATE / TIME: 202411191848
Unit Type and Rh: 6200
Unit Type and Rh: 6200

## 2023-06-06 ENCOUNTER — Emergency Department (HOSPITAL_COMMUNITY): Payer: 59

## 2023-06-06 ENCOUNTER — Inpatient Hospital Stay (HOSPITAL_COMMUNITY)
Admission: EM | Admit: 2023-06-06 | Discharge: 2023-06-10 | DRG: 871 | Discharge status: Against Medical Advice | Payer: 59 | Attending: Family Medicine | Admitting: Family Medicine

## 2023-06-06 ENCOUNTER — Other Ambulatory Visit: Payer: Self-pay

## 2023-06-06 ENCOUNTER — Encounter (HOSPITAL_COMMUNITY): Payer: Self-pay

## 2023-06-06 DIAGNOSIS — Z8711 Personal history of peptic ulcer disease: Secondary | ICD-10-CM | POA: Diagnosis not present

## 2023-06-06 DIAGNOSIS — E872 Acidosis, unspecified: Secondary | ICD-10-CM | POA: Diagnosis present

## 2023-06-06 DIAGNOSIS — Z841 Family history of disorders of kidney and ureter: Secondary | ICD-10-CM

## 2023-06-06 DIAGNOSIS — Z560 Unemployment, unspecified: Secondary | ICD-10-CM | POA: Diagnosis not present

## 2023-06-06 DIAGNOSIS — I10 Essential (primary) hypertension: Secondary | ICD-10-CM | POA: Diagnosis present

## 2023-06-06 DIAGNOSIS — D6959 Other secondary thrombocytopenia: Secondary | ICD-10-CM | POA: Diagnosis present

## 2023-06-06 DIAGNOSIS — D689 Coagulation defect, unspecified: Secondary | ICD-10-CM | POA: Diagnosis present

## 2023-06-06 DIAGNOSIS — Z87891 Personal history of nicotine dependence: Secondary | ICD-10-CM | POA: Diagnosis not present

## 2023-06-06 DIAGNOSIS — K7030 Alcoholic cirrhosis of liver without ascites: Secondary | ICD-10-CM | POA: Diagnosis present

## 2023-06-06 DIAGNOSIS — Z811 Family history of alcohol abuse and dependence: Secondary | ICD-10-CM

## 2023-06-06 DIAGNOSIS — K922 Gastrointestinal hemorrhage, unspecified: Secondary | ICD-10-CM | POA: Diagnosis not present

## 2023-06-06 DIAGNOSIS — E7800 Pure hypercholesterolemia, unspecified: Secondary | ICD-10-CM | POA: Diagnosis present

## 2023-06-06 DIAGNOSIS — D696 Thrombocytopenia, unspecified: Secondary | ICD-10-CM | POA: Diagnosis present

## 2023-06-06 DIAGNOSIS — F10239 Alcohol dependence with withdrawal, unspecified: Secondary | ICD-10-CM | POA: Diagnosis not present

## 2023-06-06 DIAGNOSIS — R935 Abnormal findings on diagnostic imaging of other abdominal regions, including retroperitoneum: Secondary | ICD-10-CM

## 2023-06-06 DIAGNOSIS — F10939 Alcohol use, unspecified with withdrawal, unspecified: Secondary | ICD-10-CM

## 2023-06-06 DIAGNOSIS — J690 Pneumonitis due to inhalation of food and vomit: Secondary | ICD-10-CM | POA: Diagnosis present

## 2023-06-06 DIAGNOSIS — A419 Sepsis, unspecified organism: Secondary | ICD-10-CM | POA: Diagnosis present

## 2023-06-06 DIAGNOSIS — E861 Hypovolemia: Secondary | ICD-10-CM | POA: Diagnosis present

## 2023-06-06 DIAGNOSIS — R571 Hypovolemic shock: Secondary | ICD-10-CM | POA: Diagnosis present

## 2023-06-06 DIAGNOSIS — K760 Fatty (change of) liver, not elsewhere classified: Secondary | ICD-10-CM | POA: Diagnosis present

## 2023-06-06 DIAGNOSIS — J189 Pneumonia, unspecified organism: Secondary | ICD-10-CM | POA: Diagnosis present

## 2023-06-06 DIAGNOSIS — R040 Epistaxis: Secondary | ICD-10-CM | POA: Diagnosis present

## 2023-06-06 DIAGNOSIS — Z860101 Personal history of adenomatous and serrated colon polyps: Secondary | ICD-10-CM

## 2023-06-06 DIAGNOSIS — I700 Atherosclerosis of aorta: Secondary | ICD-10-CM | POA: Diagnosis present

## 2023-06-06 DIAGNOSIS — R251 Tremor, unspecified: Secondary | ICD-10-CM | POA: Diagnosis not present

## 2023-06-06 DIAGNOSIS — K2971 Gastritis, unspecified, with bleeding: Secondary | ICD-10-CM | POA: Diagnosis present

## 2023-06-06 DIAGNOSIS — Z860100 Personal history of colon polyps, unspecified: Secondary | ICD-10-CM

## 2023-06-06 DIAGNOSIS — D649 Anemia, unspecified: Secondary | ICD-10-CM

## 2023-06-06 DIAGNOSIS — D62 Acute posthemorrhagic anemia: Secondary | ICD-10-CM | POA: Diagnosis present

## 2023-06-06 LAB — MRSA NEXT GEN BY PCR, NASAL: MRSA by PCR Next Gen: NOT DETECTED

## 2023-06-06 LAB — COMPREHENSIVE METABOLIC PANEL
ALT: 38 U/L (ref 0–44)
AST: 95 U/L — ABNORMAL HIGH (ref 15–41)
Albumin: 3.3 g/dL — ABNORMAL LOW (ref 3.5–5.0)
Alkaline Phosphatase: 24 U/L — ABNORMAL LOW (ref 38–126)
Anion gap: 13 (ref 5–15)
BUN: 13 mg/dL (ref 6–20)
CO2: 17 mmol/L — ABNORMAL LOW (ref 22–32)
Calcium: 8 mg/dL — ABNORMAL LOW (ref 8.9–10.3)
Chloride: 107 mmol/L (ref 98–111)
Creatinine, Ser: 0.97 mg/dL (ref 0.61–1.24)
GFR, Estimated: >60 mL/min (ref >60)
Glucose, Bld: 154 mg/dL — ABNORMAL HIGH (ref 70–99)
Potassium: 3.8 mmol/L (ref 3.5–5.1)
Sodium: 137 mmol/L (ref 135–145)
Total Bilirubin: 1.6 mg/dL — ABNORMAL HIGH (ref <1.2)
Total Protein: 6.7 g/dL (ref 6.5–8.1)

## 2023-06-06 LAB — CBC
HCT: 15.4 % — ABNORMAL LOW (ref 39.0–52.0)
Hemoglobin: 4.6 g/dL — CL (ref 13.0–17.0)
MCH: 24.5 pg — ABNORMAL LOW (ref 26.0–34.0)
MCHC: 29.9 g/dL — ABNORMAL LOW (ref 30.0–36.0)
MCV: 81.9 fL (ref 80.0–100.0)
Platelets: 13 10*3/uL — CL (ref 150–400)
RBC: 1.88 MIL/uL — ABNORMAL LOW (ref 4.22–5.81)
RDW: 22.1 % — ABNORMAL HIGH (ref 11.5–15.5)
WBC: 17.2 10*3/uL — ABNORMAL HIGH (ref 4.0–10.5)
nRBC: 0.1 % (ref 0.0–0.2)

## 2023-06-06 LAB — GLUCOSE, CAPILLARY: Glucose-Capillary: 137 mg/dL — ABNORMAL HIGH (ref 70–99)

## 2023-06-06 LAB — DIC (DISSEMINATED INTRAVASCULAR COAGULATION)PANEL
D-Dimer, Quant: 2.4 ug{FEU}/mL — ABNORMAL HIGH (ref 0.00–0.50)
Fibrinogen: 217 mg/dL (ref 210–475)
INR: 1.4 — ABNORMAL HIGH (ref 0.8–1.2)
Platelets: 12 10*3/uL — CL (ref 150–400)
Prothrombin Time: 17.1 s — ABNORMAL HIGH (ref 11.4–15.2)
Smear Review: NONE SEEN
aPTT: 30 s (ref 24–36)

## 2023-06-06 LAB — LIPASE, BLOOD: Lipase: 24 U/L (ref 11–51)

## 2023-06-06 LAB — I-STAT CG4 LACTIC ACID, ED: Lactic Acid, Venous: 2.4 mmol/L (ref 0.5–1.9)

## 2023-06-06 LAB — RETIC PANEL
Immature Retic Fract: 32.1 % — ABNORMAL HIGH (ref 2.3–15.9)
RBC.: 2.19 MIL/uL — ABNORMAL LOW (ref 4.22–5.81)
Retic Count, Absolute: 36.1 10*3/uL (ref 19.0–186.0)
Retic Ct Pct: 1.7 % (ref 0.4–3.1)
Reticulocyte Hemoglobin: 36 pg (ref >27.9)

## 2023-06-06 LAB — IRON AND TIBC
Iron: 343 ug/dL — ABNORMAL HIGH (ref 45–182)
Saturation Ratios: 85 % — ABNORMAL HIGH (ref 17.9–39.5)
TIBC: 402 ug/dL (ref 250–450)
UIBC: 59 ug/dL

## 2023-06-06 LAB — PROTIME-INR
INR: 1.5 — ABNORMAL HIGH (ref 0.8–1.2)
Prothrombin Time: 17.8 s — ABNORMAL HIGH (ref 11.4–15.2)

## 2023-06-06 LAB — IMMATURE PLATELET FRACTION: Immature Platelet Fraction: 8.4 % (ref 1.2–8.6)

## 2023-06-06 LAB — LACTIC ACID, PLASMA: Lactic Acid, Venous: 2.6 mmol/L (ref 0.5–1.9)

## 2023-06-06 LAB — FERRITIN: Ferritin: 30 ng/mL (ref 24–336)

## 2023-06-06 LAB — PREPARE RBC (CROSSMATCH)

## 2023-06-06 MED ORDER — CHLORHEXIDINE GLUCONATE CLOTH 2 % EX PADS
6.0000 | MEDICATED_PAD | Freq: Every day | CUTANEOUS | Status: DC
  Administered 2023-06-06 – 2023-06-09 (×3): 6 via TOPICAL

## 2023-06-06 MED ORDER — IOHEXOL 350 MG/ML SOLN
100.0000 mL | Freq: Once | INTRAVENOUS | Status: AC | PRN
  Administered 2023-06-06: 100 mL via INTRAVENOUS

## 2023-06-06 MED ORDER — ALBUTEROL SULFATE (2.5 MG/3ML) 0.083% IN NEBU: 2.5000 mg | INHALATION_SOLUTION | RESPIRATORY_TRACT | Status: DC | PRN

## 2023-06-06 MED ORDER — PANTOPRAZOLE SODIUM 40 MG IV SOLR
40.0000 mg | Freq: Two times a day (BID) | INTRAVENOUS | Status: DC
  Administered 2023-06-06 – 2023-06-09 (×7): 40 mg via INTRAVENOUS
  Filled 2023-06-06 (×7): qty 10

## 2023-06-06 MED ORDER — ACETAMINOPHEN 325 MG PO TABS
650.0000 mg | ORAL_TABLET | Freq: Four times a day (QID) | ORAL | Status: DC | PRN
  Administered 2023-06-07 – 2023-06-09 (×2): 650 mg via ORAL
  Filled 2023-06-06 (×2): qty 2

## 2023-06-06 MED ORDER — ONDANSETRON HCL 4 MG/2ML IJ SOLN: 4.0000 mg | Freq: Four times a day (QID) | INTRAMUSCULAR | Status: DC | PRN

## 2023-06-06 MED ORDER — PANTOPRAZOLE SODIUM 40 MG IV SOLR
40.0000 mg | Freq: Once | INTRAVENOUS | Status: AC
  Administered 2023-06-06: 40 mg via INTRAVENOUS
  Filled 2023-06-06: qty 10

## 2023-06-06 MED ORDER — ORAL CARE MOUTH RINSE
15.0000 mL | OROMUCOSAL | Status: DC
  Administered 2023-06-07: 15 mL via OROMUCOSAL

## 2023-06-06 MED ORDER — MELATONIN 5 MG PO TABS
5.0000 mg | ORAL_TABLET | Freq: Every evening | ORAL | Status: AC | PRN
Start: 2023-06-06 — End: 2023-06-09
  Administered 2023-06-06 – 2023-06-09 (×3): 5 mg via ORAL
  Filled 2023-06-06 (×3): qty 1

## 2023-06-06 MED ORDER — SODIUM CHLORIDE 0.9% IV SOLUTION: Freq: Once | INTRAVENOUS | Status: AC

## 2023-06-06 MED ORDER — ONDANSETRON HCL 4 MG PO TABS
4.0000 mg | ORAL_TABLET | Freq: Four times a day (QID) | ORAL | Status: DC | PRN
  Administered 2023-06-06: 4 mg via ORAL
  Filled 2023-06-06: qty 1

## 2023-06-06 MED ORDER — SODIUM CHLORIDE 0.9 % IV BOLUS
500.0000 mL | Freq: Once | INTRAVENOUS | Status: AC
  Administered 2023-06-06: 500 mL via INTRAVENOUS

## 2023-06-06 MED ORDER — ORAL CARE MOUTH RINSE: 15.0000 mL | OROMUCOSAL | Status: DC | PRN

## 2023-06-06 MED ORDER — SODIUM CHLORIDE 0.9 % IV BOLUS: 1000.0000 mL | Freq: Once | INTRAVENOUS | Status: DC

## 2023-06-06 MED ORDER — IOHEXOL 300 MG/ML  SOLN: 100.0000 mL | Freq: Once | INTRAMUSCULAR | Status: DC | PRN

## 2023-06-06 MED ORDER — ACETAMINOPHEN 650 MG RE SUPP: 650.0000 mg | Freq: Four times a day (QID) | RECTAL | Status: DC | PRN

## 2023-06-06 NOTE — ED Notes (Signed)
ED TO INPATIENT HANDOFF REPORT  ED Nurse Name and Phone #: Deirdre Peer Name/Age/Gender Stephen Bowers 62 y.o. male Room/Bed: WA15/WA15  Code Status   Code Status: Prior  Home/SNF/Other Home Patient oriented to: self, place, time, and situation Is this baseline? Yes   Triage Complete: Triage complete  Chief Complaint Lower GI bleed [K92.2]  Triage Note EMS reports from home, GI bleed since last night. Red bloody stool. Pt has Hx of same and had transfusion 2 weeks ago. No blood thinners. EMS also states smell of ETOH profound and also hgas Hx of ETOH abuse.  BP 124/70 HR 112 RR 22 Sp02 98 RA CBG 396  18ga LAC NS enroute.   Allergies No Known Allergies  Level of Care/Admitting Diagnosis ED Disposition     ED Disposition  Admit   Condition  --   Comment  Hospital Area: [facility] [[id number]]  Level of Care: Stepdown [14]  Admit to SDU based on following criteria: Severe physiological/psychological symptoms:  Any diagnosis requiring assessment & intervention at least every 4 hours on an ongoing basis to obtain desired patient outcomes including stability and rehabilitation  May admit patient to [facility] or [facility] if equivalent level of care is available:: Yes  Covid Evaluation: Asymptomatic - no recent exposure (last 10 days) testing not required  Diagnosis: Lower GI bleed [161096]  Admitting Physician: Maryln Gottron [[id number]]  Attending Physician: Kirby Crigler, MIR Jaxson.Roy [[id number]]  Certification:: I certify this patient will need inpatient services for at least 2 midnights  Expected Medical Readiness: 06/09/2023          B Medical/Surgery History Past Medical History:  Diagnosis Date   Chronic musculoskeletal pain    CKD (chronic kidney disease)    follows with [facility] [name])   High cholesterol Dx 2011   Hypertension    Pancolitis (HCC)    Substance abuse (HCC)    Wears partial dentures    upper only   Past  Surgical History:  Procedure Laterality Date   BIOPSY  07/06/2021   Procedure: BIOPSY;  Surgeon: [name], MD;  Location: [facility] ENDOSCOPY;  Service: Endoscopy;;   COLONOSCOPY  12/2014   hx polyps - Pyrtle   ESOPHAGOGASTRODUODENOSCOPY (EGD) WITH PROPOFOL N/A 07/06/2021   Dr [name]. [facility] ENDOSCOPY. findings: non-bleeding Duodenal ulcer, gastric erosions. Path: mild chronic gastritis, no H Pylori.   MULTIPLE TOOTH EXTRACTIONS       A IV Location/Drains/Wounds Patient Lines/Drains/Airways Status     Active Line/Drains/Airways     Name Placement date Placement time Site Days   Peripheral IV 06/06/23 18 G Left Antecubital 06/06/23  1253  Antecubital  less than 1            Intake/Output Last 24 hours No intake or output data in the 24 hours ending 06/06/23 1644  Labs/Imaging Results for orders placed or performed during the hospital encounter of 06/06/23 (from the past 48 hour(s))  Comprehensive metabolic panel     Status: Abnormal   Collection Time: 06/06/23  2:24 PM  Result Value Ref Range   Sodium 137 135 - 145 mmol/L   Potassium 3.8 3.5 - 5.1 mmol/L   Chloride 107 98 - 111 mmol/L   CO2 17 (L) 22 - 32 mmol/L   Glucose, Bld 154 (H) 70 - 99 mg/dL    Comment: Glucose reference range applies only to samples taken after fasting for at least 8 hours.   BUN 13 6 -  20 mg/dL   Creatinine, Ser 1.32 0.61 - 1.24 mg/dL   Calcium 8.0 (L) 8.9 - 10.3 mg/dL   Total Protein 6.7 6.5 - 8.1 g/dL   Albumin 3.3 (L) 3.5 - 5.0 g/dL   AST 95 (H) 15 - 41 U/L   ALT 38 0 - 44 U/L   Alkaline Phosphatase 24 (L) 38 - 126 U/L   Total Bilirubin 1.6 (H) <1.2 mg/dL   GFR, Estimated >44 >01 mL/min    Comment: (NOTE) Calculated using the CKD-EPI Creatinine Equation (2021)    Anion gap 13 5 - 15    Comment: Performed at [facility], [address] [place], [place] [address]  CBC with Differential     Status: Abnormal   Collection Time: 06/06/23  2:24 PM  Result Value Ref Range    WBC 11.8 (H) 4.0 - 10.5 K/uL   RBC 1.85 (L) 4.22 - 5.81 MIL/uL   Hemoglobin 4.6 (LL) 13.0 - 17.0 g/dL    Comment: REPEATED TO VERIFY THIS CRITICAL RESULT HAS VERIFIED AND BEEN CALLED TO KISER,C RN BY GOLSON,M ON 12 04 2024 AT 1509, AND HAS BEEN READ BACK.     HCT 14.9 (L) 39.0 - 52.0 %   MCV 80.5 80.0 - 100.0 fL   MCH 24.9 (L) 26.0 - 34.0 pg   MCHC 30.9 30.0 - 36.0 g/dL   RDW 36.6 (H) 44.0 - 34.7 %   Platelets 13 (LL) 150 - 400 K/uL    Comment: SPECIMEN CHECKED FOR CLOTS Immature Platelet Fraction may be clinically indicated, consider ordering this additional test [id number] REPEATED TO VERIFY PLATELET COUNT CONFIRMED BY SMEAR    nRBC 0.3 (H) 0.0 - 0.2 %   Neutrophils Relative % 87 %   Neutro Abs 10.4 (H) 1.7 - 7.7 K/uL   Lymphocytes Relative 6 %   Lymphs Abs 0.7 0.7 - 4.0 K/uL   Monocytes Relative 6 %   Monocytes Absolute 0.7 0.1 - 1.0 K/uL   Eosinophils Relative 0 %   Eosinophils Absolute 0.0 0.0 - 0.5 K/uL   Basophils Relative 0 %   Basophils Absolute 0.0 0.0 - 0.1 K/uL   Immature Granulocytes 1 %   Abs Immature Granulocytes 0.08 (H) 0.00 - 0.07 K/uL   Stomatocytes PRESENT     Comment: Performed at [facility], [address], [place], [place] [address]  Lipase, blood     Status: None   Collection Time: 06/06/23  2:24 PM  Result Value Ref Range   Lipase 24 11 - 51 U/L    Comment: Performed at [facility], [address] [place], [place] [address]  Protime-INR     Status: Abnormal   Collection Time: 06/06/23  2:24 PM  Result Value Ref Range   Prothrombin Time 17.8 (H) 11.4 - 15.2 seconds   INR 1.5 (H) 0.8 - 1.2    Comment: (NOTE) INR goal varies based on device and disease states. Performed at [facility], [address], [place], [place] [address]   Type and screen [facility]     Status: None (Preliminary result)   Collection Time: 06/06/23  2:26 PM  Result Value Ref Range    ABO/RH(D) A POS    Antibody Screen NEG    Sample Expiration 06/09/2023,2359    Unit Number [id number]    Blood Component Type RED CELLS,LR    Unit division 00    Status of Unit ISSUED  Transfusion Status OK TO TRANSFUSE    Crossmatch Result      Compatible Performed at [facility], [address] [place], [place] [address]    Unit Number [id number]    Blood Component Type RED CELLS,LR    Unit division 00    Status of Unit ALLOCATED    Transfusion Status OK TO TRANSFUSE    Crossmatch Result Compatible    Unit Number [id number]    Blood Component Type RED CELLS,LR    Unit division 00    Status of Unit ALLOCATED    Transfusion Status OK TO TRANSFUSE    Crossmatch Result Compatible   I-Stat Lactic Acid, ED     Status: Abnormal   Collection Time: 06/06/23  3:17 PM  Result Value Ref Range   Lactic Acid, Venous 2.4 (HH) 0.5 - 1.9 mmol/L   Comment NOTIFIED PHYSICIAN   Prepare RBC     Status: None   Collection Time: 06/06/23  3:19 PM  Result Value Ref Range   Order Confirmation      ORDER PROCESSED BY BLOOD BANK Performed at [facility], [address], [place], [place] [address]   Prepare platelet pheresis     Status: None (Preliminary result)   Collection Time: 06/06/23  3:20 PM  Result Value Ref Range   Unit Number [id number]    Blood Component Type PLTP3 PSORALEN TREATED    Unit division 00    Status of Unit ALLOCATED    Transfusion Status OK TO TRANSFUSE    CT ANGIO GI BLEED  Result Date: 06/06/2023 CLINICAL DATA:  Suspect GI bleed EXAM: CTA ABDOMEN AND PELVIS WITHOUT AND WITH CONTRAST TECHNIQUE: Multidetector CT imaging of the abdomen and pelvis was performed using the standard protocol during bolus administration of intravenous contrast. Multiplanar reconstructed images and MIPs were obtained and reviewed to evaluate the vascular anatomy. RADIATION DOSE REDUCTION: This exam was performed according to the departmental  dose-optimization program which includes automated exposure control, adjustment of the mA and/or kV according to patient size and/or use of iterative reconstruction technique. CONTRAST:  OMNIPAQUE IOHEXOL 350 MG/ML SOLN COMPARISON:  12/12/2022 FINDINGS: VASCULAR Normal contour and caliber of the abdominal aorta. Moderate mixed calcific atherosclerosis no evidence of aneurysm, dissection, or other acute aortic pathology. Duplicated left, solitary right renal arteries with otherwise standard branching pattern of the abdominal aorta. Review of the MIP images confirms the above findings. NON-VASCULAR Lower Chest: Heterogeneous centrilobular nodular and ground-glass airspace opacity in the right-greater-than-left lung bases (series 4, image 2). Hepatobiliary: No solid liver abnormality is seen. Hepatic steatosis. No gallstones, gallbladder wall thickening, or biliary dilatation. Pancreas: Unremarkable. No pancreatic ductal dilatation or surrounding inflammatory changes. Spleen: Normal in size without significant abnormality. Adrenals/Urinary Tract: Adrenal glands are unremarkable. Unchanged perinephric fat stranding. Benign hemorrhagic or proteinaceous cyst of the lateral midportion of the left kidney, for which no further follow-up or characterization is required. Kidneys are otherwise normal, without renal calculi, solid lesion, or hydronephrosis. Bladder is unremarkable. Stomach/Bowel: Stomach is within normal limits. Appendix appears normal. No evidence of bowel wall thickening, distention, or inflammatory changes. Small focus of endoluminal arterial contrast blush in the cecal base (series 6, image 159) Lymphatic: No enlarged abdominal or pelvic lymph nodes. Reproductive: No mass or other significant abnormality. Other: Small, fat containing bilateral inguinal hernias. No ascites. Musculoskeletal: No acute osseous findings. IMPRESSION: 1. Small focus of endoluminal arterial contrast blush in the cecal base,  presumed nidus of GI bleeding. No  obvious anatomic abnormality visible in this vicinity, possibly this reflects a small vascular malformation or ulceration. 2. Heterogeneous centrilobular nodular and ground-glass airspace opacity in the right-greater-than-left lung bases, consistent with infection or aspiration. 3. Hepatic steatosis. Aortic Atherosclerosis (ICD10-I70.0). Electronically Signed   By: Jearld Lesch M.D.   On: 06/06/2023 15:28   DG Abd Acute W/Chest  Result Date: 06/06/2023 CLINICAL DATA:  Concern for GI bleed. EXAM: DG ABDOMEN ACUTE WITH 1 VIEW CHEST COMPARISON:  Chest radiograph dated May 27, 2022. FINDINGS: There is no evidence of dilated bowel loops or free intraperitoneal air. No radiopaque calculi or other significant radiographic abnormality is seen. Overlapping telemetry wires. Heart size and mediastinal contours are within normal limits. Hazy opacities at the right lung base. No pleural effusion or pneumothorax. IMPRESSION: 1. Negative abdominal radiographs. Nonobstructive bowel-gas pattern. 2. Hazy opacities at the right lung base may be secondary to an infectious/inflammatory etiology. Electronically Signed   By: Hart Robinsons M.D.   On: 06/06/2023 14:06    Pending Labs Unresulted Labs (From admission, onward)     Start     Ordered   06/07/23 0013  Hemoglobin and hematocrit, blood  Now then every 8 hours,   R (with TIMED occurrences)     Comments: Call MD for hgb less than 7 for transfusions orders    06/06/23 1620   06/06/23 1800  Lactic acid, plasma  Once,   R        06/06/23 1643   06/06/23 1611  CBC  ONCE - STAT,   STAT        06/06/23 1611            Vitals/Pain Today's Vitals   06/06/23 1523 06/06/23 1554 06/06/23 1613 06/06/23 1630  BP: 96/67 111/74 131/76 118/72  Pulse: (!) 141 (!) 119 (!) 127 (!) 126  Resp: (!) 21 20 18 19   Temp:  98.2 F (36.8 C) 98.9 F (37.2 C) 98.7 F (37.1 C)  TempSrc:  Oral Oral Oral  SpO2: 100%  98% 100%  PainSc:         Isolation Precautions No active isolations  Medications Medications  pantoprazole (PROTONIX) injection 40 mg (has no administration in time range)  sodium chloride 0.9 % bolus 1,000 mL (has no administration in time range)  pantoprazole (PROTONIX) injection 40 mg (40 mg Intravenous Given 06/06/23 1429)  iohexol (OMNIPAQUE) 350 MG/ML injection 100 mL (100 mLs Intravenous Contrast Given 06/06/23 1350)  0.9 %  sodium chloride infusion (Manually program via Guardrails IV Fluids) ( Intravenous New Bag/Given 06/06/23 1557)    Mobility non-ambulatory     Focused Assessments GI Bleed/ Acute anemia   R Recommendations: See Admitting Provider Note  Report given to:   Additional Notes: .

## 2023-06-06 NOTE — ED Triage Notes (Signed)
EMS reports from home, GI bleed since last night. Red bloody stool. Pt has Hx of same and had transfusion 2 weeks ago. No blood thinners. EMS also states smell of ETOH profound and also hgas Hx of ETOH abuse.  BP 124/70 HR 112 RR 22 Sp02 98 RA CBG 396  18ga LAC NS enroute.

## 2023-06-06 NOTE — ED Provider Notes (Signed)
Patient's care assumed at 3:30 PM.  Patient has a hemoglobin of of 4.6.  Patient has been having melena today.  Patient has blood transfusion ordered.  Patient is pending CT angio bleed study. CT scan shows Small focus of endoluminal arterial contrast blush in the cecal  base, presumed nidus of GI bleeding. No obvious anatomic abnormality  visible in this vicinity, possibly this reflects a small vascular  malformation or ulceration.   I spoke with Dr. Deanne Coffer interventional radiology who will see the patient for IR intervention. I spoke with Mike Gip GI PA on-call for [facility].  [facility] will consult on patient Hospitalist consulted.     Elson Areas, PA-C 06/06/23 1902    Rozelle Logan, DO 06/06/23 2340

## 2023-06-06 NOTE — ED Notes (Signed)
Pt can't stand up due unsteady gate

## 2023-06-06 NOTE — Consult Note (Addendum)
Consultation  Referring Provider: Edman Circle Wilkie Aye  Primary Care Physician:  [name], MD Primary Gastroenterologist:  Dr.Pyrtle  Reason for Consultation: Acute GI bleed, dark red blood per rectum at home x 2 large-volume, syncope  HPI: Stephen Bowers is a 62 y.o. male known to GI from prior colonoscopies.  He last had colonoscopy in 2019 per Dr. [name] done for follow-up with history of adenomatous polyps.  He had 3 small polyps removed at that time all 2 to 5 mm in size, these were hyperplastic and noted to have internal hemorrhoids. He was also seen when hospitalized in January 2023 with melena anemia and iron deficiency and at that time at EGD had a nonbleeding duodenal ulcer 5 mm and normal-appearing esophagus, no varices or evidence of portal gastropathy.  Patient does have history of EtOH abuse, had reported 5 drinks per day generally on arrival but tells me he does not drink every day, last drink yesterday.  He reports acute onset of bleeding yesterday with dark red blood per rectum at home early in the morning hours reported as a large amount in the commode per patient.  His wife says he tried to get up to go to the bathroom a second time, passed out and then had a large amount of dark red blood and clots on the floor.  Brought to the ER where he was found to be tachycardic but not hypotensive, on rectal exam dark red blood  Initial labs-BBC 11.8/hemoglobin 4.6/hematocrit 14.9 And platelet count 13,000 INR 1.5/pro time 17.8 Potassium 3.8/BUN 13/creatinine 0.97 LFTs with AST 95/ALT 38/T. bili 1.6  Interestingly patient had been in the emergency room on 05/22/2023.  Being seen by his primary doc as an outpatient.  He had labs done that showed a hemoglobin of 5.6.  Apparently patient had been complaining of fatigue and lightheadedness but was not aware of any acute bleeding or melena. He did not want to be admitted, was transfused 2 units of packed RBCs and allowed  discharge Platelet count at that time 275  Reviewing previous labs platelet count was 121 on 05/15/2023, 64,000 on 12/19/2022-he is also been anemic with hemoglobin in the 9-10 range over the past 5 to 6 months  CT angio abdomen pelvis done early this afternoon shows central lobar nodular and groundglass opacities in the right greater than left lung bases, no solid liver lesion, hepatic steatosis, stomach appears normal, there is a small focus of endoluminal arterial contrast blush in the cecal base to possibly reflect a small vascular malformation or ulceration.  Patient is currently being transfused first unit of 3 units of packed RBCs, platelets have been ordered  Not had any further bloody stool since arrival  I contacted IR and they are evaluating patient currently.  Has not had any associated abdominal pain or cramping, had not had any vomiting at home but has spit up some small amounts of red blood since he has been in the emergency room Neither he or his wife are aware of him having low platelet count  Says he has not had any prescription medications recently, denies any aspirin or NSAID use-he reports he was to follow-up with his primary care doctor   Past Medical History:  Diagnosis Date   Chronic musculoskeletal pain    CKD (chronic kidney disease)    follows with [facility] [name])   High cholesterol Dx 2011   Hypertension    Pancolitis (HCC)    Substance abuse (HCC)  Wears partial dentures    upper only    Past Surgical History:  Procedure Laterality Date   BIOPSY  07/06/2021   Procedure: BIOPSY;  Surgeon: [name], MD;  Location: [facility] ENDOSCOPY;  Service: Endoscopy;;   COLONOSCOPY  12/2014   hx polyps - Pyrtle   ESOPHAGOGASTRODUODENOSCOPY (EGD) WITH PROPOFOL N/A 07/06/2021   Dr [name]. [facility] ENDOSCOPY. findings: non-bleeding Duodenal ulcer, gastric erosions. Path: mild chronic gastritis, no H Pylori.   MULTIPLE TOOTH EXTRACTIONS      Prior to  Admission medications   Medication Sig Start Date End Date Taking? Authorizing Provider  Ascorbic Acid (VITAMIN C PO) Take 1 tablet by mouth daily. Patient not taking: Reported on 05/15/2023    [provider]  Cholecalciferol (VITAMIN D-3 PO) Take 1 capsule by mouth daily. Patient not taking: Reported on 05/15/2023    [provider]  diphenhydramine-acetaminophen (TYLENOL PM) 25-500 MG TABS tablet Take 2 tablets by mouth at bedtime as needed (sleep, pain). Patient not taking: Reported on 05/15/2023    [provider]  ELDERBERRY PO Take 1 capsule by mouth daily. Patient not taking: Reported on 05/15/2023    [provider]  folic acid (FOLVITE) 1 MG tablet Take 1 tablet (1 mg total) by mouth daily. Patient not taking: Reported on 05/15/2023 12/23/22   Maretta Bees, MD  ketorolac (TORADOL) 10 MG tablet Take 1 tablet (10 mg total) by mouth every 6 (six) hours as needed (pain). Patient not taking: Reported on 05/15/2023 03/29/23   [name], MD  meclizine (ANTIVERT) 25 MG tablet Take 1 tablet (25 mg total) by mouth 3 (three) times daily as needed for dizziness. 05/15/23   [name], MD  Multiple Vitamin (MULTIVITAMIN WITH MINERALS) TABS tablet Take 1 tablet by mouth daily. Patient not taking: Reported on 05/15/2023 12/23/22   Maretta Bees, MD  pantoprazole (PROTONIX) 40 MG tablet Take 1 tablet (40 mg total) by mouth daily. Patient not taking: Reported on 05/15/2023 12/23/22   Maretta Bees, MD  thiamine (VITAMIN B1) 100 MG tablet Take 1 tablet (100 mg total) by mouth daily. Patient not taking: Reported on 05/15/2023 12/23/22   Maretta Bees, MD  VITAMIN E PO Take 1 capsule by mouth daily. Patient not taking: Reported on 05/15/2023    [provider]    Current Facility-Administered Medications  Medication Dose Route Frequency Provider Last Rate Last Admin   pantoprazole (PROTONIX) injection 40 mg  40 mg Intravenous  Q12H [name], Amy S, PA-C       Current Outpatient Medications  Medication Sig Dispense Refill   Ascorbic Acid (VITAMIN C PO) Take 1 tablet by mouth daily. (Patient not taking: Reported on 05/15/2023)     Cholecalciferol (VITAMIN D-3 PO) Take 1 capsule by mouth daily. (Patient not taking: Reported on 05/15/2023)     diphenhydramine-acetaminophen (TYLENOL PM) 25-500 MG TABS tablet Take 2 tablets by mouth at bedtime as needed (sleep, pain). (Patient not taking: Reported on 05/15/2023)     ELDERBERRY PO Take 1 capsule by mouth daily. (Patient not taking: Reported on 05/15/2023)     folic acid (FOLVITE) 1 MG tablet Take 1 tablet (1 mg total) by mouth daily. (Patient not taking: Reported on 05/15/2023) 30 tablet 0   ketorolac (TORADOL) 10 MG tablet Take 1 tablet (10 mg total) by mouth every 6 (six) hours as needed (pain). (Patient not taking: Reported on 05/15/2023) 20 tablet 0   meclizine (ANTIVERT) 25 MG tablet Take  1 tablet (25 mg total) by mouth 3 (three) times daily as needed for dizziness. 60 tablet 0   Multiple Vitamin (MULTIVITAMIN WITH MINERALS) TABS tablet Take 1 tablet by mouth daily. (Patient not taking: Reported on 05/15/2023) 30 tablet 0   pantoprazole (PROTONIX) 40 MG tablet Take 1 tablet (40 mg total) by mouth daily. (Patient not taking: Reported on 05/15/2023) 30 tablet 1   thiamine (VITAMIN B1) 100 MG tablet Take 1 tablet (100 mg total) by mouth daily. (Patient not taking: Reported on 05/15/2023) 30 tablet 0   VITAMIN E PO Take 1 capsule by mouth daily. (Patient not taking: Reported on 05/15/2023)      Allergies as of 06/06/2023   (No Known Allergies)    Family History  Problem Relation Age of Onset   Liver disease Father    Alcohol abuse Father    Diabetes Sister    Obesity Sister    Cancer Mother        breast    Kidney disease Mother    Colon cancer Neg Hx    Esophageal cancer Neg Hx    Rectal cancer Neg Hx    Stomach cancer Neg Hx    Colon polyps Neg Hx      Social History   Socioeconomic History   Marital status: Married    Spouse name: Elnita Maxwell   Number of children: 1    Years of education: 12    Highest education level: Not on file  Occupational History   Occupation: Unemployed   Tobacco Use   Smoking status: Former    Current packs/day: 0.00    Average packs/day: 0.3 packs/day for 6.0 years (1.5 ttl pk-yrs)    Types: Cigarettes    Start date: 63    Quit date: 1995    Years since quitting: 29.9   Smokeless tobacco: Never  Vaping Use   Vaping status: Never Used  Substance and Sexual Activity   Alcohol use: Yes    Alcohol/week: 6.0 standard drinks of alcohol    Types: 6 Cans of beer per week    Comment: 6 drinks Beer /Liquor   Drug use: No   Sexual activity: Yes  Other Topics Concern   Not on file  Social History Narrative   [place] [address] family house.    Child goes to [employer] in [place], 22 yo F,          Social Determinants of Health   Financial Resource Strain: Low Risk  (05/15/2023)   Overall Financial Resource Strain (CARDIA)    Difficulty of Paying Living Expenses: Not hard at all  Food Insecurity: No Food Insecurity (12/12/2022)   Hunger Vital Sign    Worried About Running Out of Food in the Last Year: Never true    Ran Out of Food in the Last Year: Never true  Transportation Needs: No Transportation Needs (12/12/2022)   PRAPARE - Administrator, Civil Service (Medical): No    Lack of Transportation (Non-Medical): No  Physical Activity: Sufficiently Active (05/15/2023)   Exercise Vital Sign    Days of Exercise per Week: 5 days    Minutes of Exercise per Session: 30 min  Stress: No Stress Concern Present (05/15/2023)   [employer] of Occupational Health - Occupational Stress Questionnaire    Feeling of Stress : Not at all  Social Connections: Moderately Isolated (05/15/2023)   Social Connection and Isolation Panel [NHANES]    Frequency of Communication with Friends and Family:  Once a week    Frequency of Social Gatherings with Friends and Family: Once a week    Attends Religious Services: Never    Database administrator or Organizations: Yes    Attends Banker Meetings: Never    Marital Status: Married  Catering manager Violence: Not At Risk (12/12/2022)   Humiliation, Afraid, Rape, and Kick questionnaire    Fear of Current or Ex-Partner: No    Emotionally Abused: No    Physically Abused: No    Sexually Abused: No    Review of Systems: Pertinent positive and negative review of systems were noted in the above HPI section.  All other review of systems was otherwise negative.   Physical Exam: Vital signs in last 24 hours: Temp:  [98.2 F (36.8 C)-98.9 F (37.2 C)] 98.9 F (37.2 C) (12/04 1613) Pulse Rate:  [119-141] 127 (12/04 1613) Resp:  [18-21] 18 (12/04 1613) BP: (96-131)/(64-76) 131/76 (12/04 1613) SpO2:  [96 %-100 %] 98 % (12/04 1613)   General:   Alert,  Well-developed, well-nourished, acutely ill-appearing older African-American male ,pleasant and cooperative in NAD-wife present at bedside-has small amounts of red blood on his gown has been spitting up some blood Head:  Normocephalic and atraumatic. Eyes:  Sclera clear, no icterus.   Conjunctiva pale Ears:  Normal auditory acuity. Nose:  No deformity, discharge,  or lesions. Mouth:  No deformity or lesions.   Neck:  Supple; no masses or thyromegaly. Lungs:  Clear throughout to auscultation.   No wheezes, crackles, or rhonchi.  Heart: Tachy Regular rate and rhythm; no murmurs, clicks, rubs,  or gallops. Abdomen:  Soft,nontender, BS active,nonpalp mass or hsm.   Rectal: Dark red blood per rectum per ER provider Msk:  Symmetrical without gross deformities. . Pulses:  Normal pulses noted. Extremities:  Without clubbing or edema. Neurologic:  Alert and  oriented x4;  grossly normal neurologically. Skin:  Intact without significant lesions or rashes.. Psych:  Alert and cooperative.  Normal mood and affect.  Intake/Output from previous day: No intake/output data recorded. Intake/Output this shift: No intake/output data recorded.  Lab Results: Recent Labs    06/06/23 1424  WBC 11.8*  HGB 4.6*  HCT 14.9*  PLT 13*   BMET Recent Labs    06/06/23 1424  NA 137  K 3.8  CL 107  CO2 17*  GLUCOSE 154*  BUN 13  CREATININE 0.97  CALCIUM 8.0*   LFT Recent Labs    06/06/23 1424  PROT 6.7  ALBUMIN 3.3*  AST 95*  ALT 38  ALKPHOS 24*  BILITOT 1.6*   PT/INR Recent Labs    06/06/23 1424  LABPROT 17.8*  INR 1.5*   Hepatitis Panel No results for input(s): "HEPBSAG", "HCVAB", "HEPAIGM", "HEPBIGM" in the last 72 hours.    IMPRESSION:  #37 62 year old African-American male who presents with an acute lower GI bleed with 2 large volume dark red bloody bowel movements at home, syncopized with the second episode  No associated nausea vomiting pain cramping etc. Hemoglobin 4.6 on arrival-however he does have history of a chronic anemia and hemoglobin was 5.8 on 05/22/2023 when he came to the emergency room after outpatient labs showed severe anemia.  At that time was not having any evidence of acute bleeding, was transfused 2 units and allowed discharge to home he did not want to be admitted.  CT angio today positive for an arterial blush in the cecal base bleed secondary to an AVM versus mucosal ulceration  Patient was also hospitalized June 2024 with hematochezia, CTA at that time was negative, did require transfusion, no endoscopic evaluation-platelets 43 at that time  Likely has been having intermittent GI blood loss over the past several months.  #2 profound thrombocytopenia-unclear if this is an accurate platelet count of 13,000 today His platelet count was 275 about 3 weeks ago however reviewing his labs he has had history of thrombocytopenia without any clear diagnosis  Question ITP  #3 history of EtOH abuse-actively drinking, last EtOH yesterday  patient says he does not drink daily No evidence of cirrhosis by CT angio  #4 history of duodenal ulcer January 2023 at EGD-nonbleeding patient hospitalized at that time with melena and iron deficiency  #5 history of adenomatous colon polyps-last colonoscopy 2019 3 diminutive polyps removed which were hyperplastic and noted internal hemorrhoids   PLAN: Keep n.p.o. Transfuse 3 units of packed RBCs as ordered Urgent IR consultation with positive CT angiography with arterial blush at the cecal base-consider arteriogram and embolization  If his profound thrombocytopenia is real this may preclude IR embolization until we can get his platelet count to a safer level. Repeating stat CBC now to reassess platelet count  IV Protonix twice daily Suspect underlying hematologic disorder with review of his labs and persistent thrombocytopenia will need hematologic evaluation/consultation.  Do not think we can explain that by his EtOH use without clear evidence of decompensated cirrhosis.  GI will follow with you   Amy EsterwoodPA-C  06/06/2023, 4:22 PM  GI ATTENDING  History, labs, radiology, endoscopy reports all personally reviewed. Agree with Amy [name]'s comprehensive consultation note as outlined without additions or deletions. Agree with transfusions and IR embolization for recurrent bleeding cecal lesion, likely AVM or ulcer. No role for GI endoscopy in this case at this point. Will follow. Thanks   Wilhemina Bonito. Eda Keys., M.D. [facility] Division of Gastroenterology

## 2023-06-06 NOTE — Consult Note (Addendum)
Chief Complaint: Patient was seen in consultation today for visceral/mesenteric arteriogram with possible embolization Chief Complaint  Patient presents with   GI Bleeding    Referring Physician(s): [name],A,PA-C  Supervising Physician: [name]  Patient Status: [facility] - ED  History of Present Illness: Stephen Bowers is a 62 y.o. male ,remote smoker, with past medical history significant for  hyperlipidemia, hypertension, substance/alcohol abuse, early cirrhosis on prior imaging in 2023, duodenal ulcer /gastric erosions noted on 2023 EGD, anemia, thrombocytopenia , prior history of GI bleeding last month with subsequent transfusion but patient left without further treatment.  He now presents to [facility] ED with recurrent rectal bleeding, weakness, syncopal episode at home.  Last alcohol intake yesterday.  Patient has also had 1 episode of bloody emesis this afternoon.  CT angiogram today revealed small focus of endoluminal arterial contrast blush in the cecal base.  Latest hemoglobin 4.6, patient receiving transfusion at this time.  Platelet count 13K.  Potassium 3.8, creatinine normal.  PT 17.8, INR 1.5.  Request now received from GI team for visceral angio with possible embolization.  BP okay.  Patient tachycardic, afebrile.  Past Medical History:  Diagnosis Date   Chronic musculoskeletal pain    CKD (chronic kidney disease)    follows with [name])   High cholesterol Dx 2011   Hypertension    Pancolitis (HCC)    Substance abuse (HCC)    Wears partial dentures    upper only    Past Surgical History:  Procedure Laterality Date   BIOPSY  07/06/2021   Procedure: BIOPSY;  Surgeon: [name], MD;  Location: [facility] ENDOSCOPY;  Service: Endoscopy;;   COLONOSCOPY  12/2014   hx polyps - Pyrtle   ESOPHAGOGASTRODUODENOSCOPY (EGD) WITH PROPOFOL N/A 07/06/2021   Dr [name]. [facility] ENDOSCOPY. findings: non-bleeding Duodenal ulcer, gastric erosions. Path: mild chronic  gastritis, no H Pylori.   MULTIPLE TOOTH EXTRACTIONS      Allergies: Patient has no known allergies.  Medications: Prior to Admission medications   Medication Sig Start Date End Date Taking? Authorizing Provider  Ascorbic Acid (VITAMIN C PO) Take 1 tablet by mouth daily. Patient not taking: Reported on 05/15/2023    [provider]  Cholecalciferol (VITAMIN D-3 PO) Take 1 capsule by mouth daily. Patient not taking: Reported on 05/15/2023    [provider]  diphenhydramine-acetaminophen (TYLENOL PM) 25-500 MG TABS tablet Take 2 tablets by mouth at bedtime as needed (sleep, pain). Patient not taking: Reported on 05/15/2023    [provider]  ELDERBERRY PO Take 1 capsule by mouth daily. Patient not taking: Reported on 05/15/2023    [provider]  folic acid (FOLVITE) 1 MG tablet Take 1 tablet (1 mg total) by mouth daily. Patient not taking: Reported on 05/15/2023 12/23/22   Maretta Bees, MD  ketorolac (TORADOL) 10 MG tablet Take 1 tablet (10 mg total) by mouth every 6 (six) hours as needed (pain). Patient not taking: Reported on 05/15/2023 03/29/23   [name], MD  meclizine (ANTIVERT) 25 MG tablet Take 1 tablet (25 mg total) by mouth 3 (three) times daily as needed for dizziness. 05/15/23   [name], MD  Multiple Vitamin (MULTIVITAMIN WITH MINERALS) TABS tablet Take 1 tablet by mouth daily. Patient not taking: Reported on 05/15/2023 12/23/22   Maretta Bees, MD  pantoprazole (PROTONIX) 40 MG tablet Take 1 tablet (40 mg total) by mouth daily. Patient not taking: Reported on 05/15/2023 12/23/22   [name],  [name], MD  thiamine (VITAMIN B1) 100 MG tablet Take 1 tablet (100 mg total) by mouth daily. Patient not taking: Reported on 05/15/2023 12/23/22   Maretta Bees, MD  VITAMIN E PO Take 1 capsule by mouth daily. Patient not taking: Reported on 05/15/2023    [provider]     Family History  Problem Relation  Age of Onset   Liver disease Father    Alcohol abuse Father    Diabetes Sister    Obesity Sister    Cancer Mother        breast    Kidney disease Mother    Colon cancer Neg Hx    Esophageal cancer Neg Hx    Rectal cancer Neg Hx    Stomach cancer Neg Hx    Colon polyps Neg Hx     Social History   Socioeconomic History   Marital status: Married    Spouse name: Elnita Maxwell   Number of children: 1    Years of education: 12    Highest education level: Not on file  Occupational History   Occupation: Unemployed   Tobacco Use   Smoking status: Former    Current packs/day: 0.00    Average packs/day: 0.3 packs/day for 6.0 years (1.5 ttl pk-yrs)    Types: Cigarettes    Start date: 5    Quit date: 1995    Years since quitting: 29.9   Smokeless tobacco: Never  Vaping Use   Vaping status: Never Used  Substance and Sexual Activity   Alcohol use: Yes    Alcohol/week: 6.0 standard drinks of alcohol    Types: 6 Cans of beer per week    Comment: 6 drinks Beer /Liquor   Drug use: No   Sexual activity: Yes  Other Topics Concern   Not on file  Social History Narrative   Lives in an [place], family house.    Child goes to [employer] in [place], 83 yo F,          Social Determinants of Health   Financial Resource Strain: Low Risk  (05/15/2023)   Overall Financial Resource Strain (CARDIA)    Difficulty of Paying Living Expenses: Not hard at all  Food Insecurity: No Food Insecurity (12/12/2022)   Hunger Vital Sign    Worried About Running Out of Food in the Last Year: Never true    Ran Out of Food in the Last Year: Never true  Transportation Needs: No Transportation Needs (12/12/2022)   PRAPARE - Administrator, Civil Service (Medical): No    Lack of Transportation (Non-Medical): No  Physical Activity: Sufficiently Active (05/15/2023)   Exercise Vital Sign    Days of Exercise per Week: 5 days    Minutes of Exercise per Session: 30 min  Stress: No Stress Concern Present  (05/15/2023)   [employer] of Occupational Health - Occupational Stress Questionnaire    Feeling of Stress : Not at all  Social Connections: Moderately Isolated (05/15/2023)   Social Connection and Isolation Panel [NHANES]    Frequency of Communication with Friends and Family: Once a week    Frequency of Social Gatherings with Friends and Family: Once a week    Attends Religious Services: Never    Database administrator or Organizations: Yes    Attends Banker Meetings: Never    Marital Status: Married    .  Review of Systems see above; currently denies fever, headache, chest pain, worsening dyspnea, cough, abdominal,  back pain, nausea.  Vital Signs: BP 131/76 (BP Location: Right Arm)   Pulse (!) 127   Temp 98.9 F (37.2 C) (Oral)   Resp 18   SpO2 98%     Physical Exam: Patient awake, noted left arm tremor; chest clear to auscultation bilaterally.  Heart with tachycardic but regular rhythm.  Abdomen soft, positive bowel sounds, nontender.  No significant lower extremity edema, intact distal pulses.  Imaging: CT ANGIO GI BLEED  Result Date: 06/06/2023 CLINICAL DATA:  Suspect GI bleed EXAM: CTA ABDOMEN AND PELVIS WITHOUT AND WITH CONTRAST TECHNIQUE: Multidetector CT imaging of the abdomen and pelvis was performed using the standard protocol during bolus administration of intravenous contrast. Multiplanar reconstructed images and MIPs were obtained and reviewed to evaluate the vascular anatomy. RADIATION DOSE REDUCTION: This exam was performed according to the departmental dose-optimization program which includes automated exposure control, adjustment of the mA and/or kV according to patient size and/or use of iterative reconstruction technique. CONTRAST:  OMNIPAQUE IOHEXOL 350 MG/ML SOLN COMPARISON:  12/12/2022 FINDINGS: VASCULAR Normal contour and caliber of the abdominal aorta. Moderate mixed calcific atherosclerosis no evidence of aneurysm, dissection, or  other acute aortic pathology. Duplicated left, solitary right renal arteries with otherwise standard branching pattern of the abdominal aorta. Review of the MIP images confirms the above findings. NON-VASCULAR Lower Chest: Heterogeneous centrilobular nodular and ground-glass airspace opacity in the right-greater-than-left lung bases (series 4, image 2). Hepatobiliary: No solid liver abnormality is seen. Hepatic steatosis. No gallstones, gallbladder wall thickening, or biliary dilatation. Pancreas: Unremarkable. No pancreatic ductal dilatation or surrounding inflammatory changes. Spleen: Normal in size without significant abnormality. Adrenals/Urinary Tract: Adrenal glands are unremarkable. Unchanged perinephric fat stranding. Benign hemorrhagic or proteinaceous cyst of the lateral midportion of the left kidney, for which no further follow-up or characterization is required. Kidneys are otherwise normal, without renal calculi, solid lesion, or hydronephrosis. Bladder is unremarkable. Stomach/Bowel: Stomach is within normal limits. Appendix appears normal. No evidence of bowel wall thickening, distention, or inflammatory changes. Small focus of endoluminal arterial contrast blush in the cecal base (series 6, image 159) Lymphatic: No enlarged abdominal or pelvic lymph nodes. Reproductive: No mass or other significant abnormality. Other: Small, fat containing bilateral inguinal hernias. No ascites. Musculoskeletal: No acute osseous findings. IMPRESSION: 1. Small focus of endoluminal arterial contrast blush in the cecal base, presumed nidus of GI bleeding. No obvious anatomic abnormality visible in this vicinity, possibly this reflects a small vascular malformation or ulceration. 2. Heterogeneous centrilobular nodular and ground-glass airspace opacity in the right-greater-than-left lung bases, consistent with infection or aspiration. 3. Hepatic steatosis. Aortic Atherosclerosis (ICD10-I70.0). Electronically Signed   By:  Jearld Lesch M.D.   On: 06/06/2023 15:28   DG Abd Acute W/Chest  Result Date: 06/06/2023 CLINICAL DATA:  Concern for GI bleed. EXAM: DG ABDOMEN ACUTE WITH 1 VIEW CHEST COMPARISON:  Chest radiograph dated May 27, 2022. FINDINGS: There is no evidence of dilated bowel loops or free intraperitoneal air. No radiopaque calculi or other significant radiographic abnormality is seen. Overlapping telemetry wires. Heart size and mediastinal contours are within normal limits. Hazy opacities at the right lung base. No pleural effusion or pneumothorax. IMPRESSION: 1. Negative abdominal radiographs. Nonobstructive bowel-gas pattern. 2. Hazy opacities at the right lung base may be secondary to an infectious/inflammatory etiology. Electronically Signed   By: Hart Robinsons M.D.   On: 06/06/2023 14:06    Labs:  CBC: Recent Labs    12/20/22 0310 05/15/23 1440 05/22/23 1400 05/22/23 1433 06/06/23  1424  WBC 10.0 7.7 6.8  --  11.8*  HGB 9.5* 5.6* 5.2* 6.8* 4.6*  HCT 29.1* 18.2* 17.2* 20.0* 14.9*  PLT 125* 121* 275  --  13*    COAGS: Recent Labs    12/12/22 1352 12/13/22 0603 06/06/23 1424  INR 1.4* 1.4* 1.5*    BMP: Recent Labs    12/18/22 0331 12/19/22 0336 05/22/23 1400 05/22/23 1433 06/06/23 1424  NA 131* 132* 135 137 137  K 3.3* 3.8 3.5 3.9 3.8  CL 99 102 104 105 107  CO2 21* 19* 17*  --  17*  GLUCOSE 126* 121* 159* 159* 154*  BUN 6 7 9 7 13   CALCIUM 9.0 8.7* 8.5*  --  8.0*  CREATININE 0.88 0.85 0.82 1.00 0.97  GFRNONAA >60 >60 >60  --  >60    LIVER FUNCTION TESTS: Recent Labs    12/18/22 0331 12/19/22 0336 05/22/23 1400 06/06/23 1424  BILITOT 0.8 0.7 0.3 1.6*  AST 87* 77* 94* 95*  ALT 42 37 37 38  ALKPHOS 34* 32* 26* 24*  PROT 7.2 7.0 7.8 6.7  ALBUMIN 3.1* 3.0* 3.7 3.3*    TUMOR MARKERS: No results for input(s): "AFPTM", "CEA", "CA199", "CHROMGRNA" in the last 8760 hours.  Assessment and Plan: 62 y.o. male ,remote smoker, with past medical history  significant for  hyperlipidemia, hypertension, substance/alcohol abuse, early cirrhosis on prior imaging in 2023, duodenal ulcer /gastric erosions noted on 2023 EGD, anemia, thrombocytopenia , prior history of GI bleeding last month with subsequent transfusion but patient left without further treatment.  He now presents to [facility] ED with recurrent rectal bleeding, weakness, syncopal episode at home.  Last alcohol intake yesterday.  Patient has also had 1 episode of bloody emesis this afternoon.  CT angiogram today revealed small focus of endoluminal arterial contrast blush in the cecal base.  Latest hemoglobin 4.6, patient receiving transfusion at this time.  Platelet count 13K.  Potassium 3.8, creatinine normal.  PT 17.8, INR 1.5;  request now received from GI team for visceral angio with possible embolization.  BP okay.  Patient tachycardic, afebrile.  Latest imaging/case reviewed by Dr. Deanne Coffer.  Risks and benefits of procedure were discussed with the patient /spouse including, but not limited to bleeding, infection, vascular injury or contrast induced renal failure.  This interventional procedure involves the use of X-rays and because of the nature of the planned procedure, it is possible that we will have prolonged use of X-ray fluoroscopy.  Potential radiation risks to you include (but are not limited to) the following: - A slightly elevated risk for cancer  several years later in life. This risk is typically less than 0.5% percent. This risk is low in comparison to the normal incidence of human cancer, which is 33% for women and 50% for men according to the American Cancer Society. - Radiation induced injury can include skin redness, resembling a rash, tissue breakdown / ulcers and hair loss (which can be temporary or permanent).   The likelihood of either of these occurring depends on the difficulty of the procedure and whether you are sensitive to radiation due to previous procedures, disease, or  genetic conditions.   IF your procedure requires a prolonged use of radiation, you will be notified and given written instructions for further action.  It is your responsibility to monitor the irradiated area for the 2 weeks following the procedure and to notify your physician if you are concerned that you have suffered a radiation induced injury.  All of the patient's questions were answered, patient is agreeable to proceed.  Consent signed and in chart.   Await follow-up CBC to recheck platelet count prior to angio- counts should be closer to 50k to safely perform angio   Thank you for this interesting consult.  I greatly enjoyed meeting [facility] and look forward to participating in their care.  A copy of this report was sent to the requesting provider on this date.  Electronically Signed: D. Jeananne Rama, PA-C 06/06/2023, 4:25 PM   I spent a total of  25 minutes   in face to face in clinical consultation, greater than 50% of which was counseling/coordinating care for visceral/mesenteric arteriogram with possible embolization

## 2023-06-06 NOTE — ED Provider Notes (Addendum)
[place] EMERGENCY DEPARTMENT AT [facility] Provider Note   CSN: [id number] Arrival date & time: 06/06/23  1232     History  Chief Complaint  Patient presents with   GI Bleeding    Stephen Bowers is a 62 y.o. male alcoholic cirrhosis, gastritis with bleeding, duodenal ulcer, alcohol abuse, anemia requiring transfusions thrombocytopenia presenting with 1 day of melena.  Patient was recently in the ER and was found to be anemic requiring blood transfusions however declined admission at that time.  Patient has any blood thinners but notes that last night began having dark stool with weakness and generalized abdominal pain.  Patient notes that when he stands up he becomes lightheaded and will almost pass out.  Patient denies any hematemesis.  Patient states he has roughly 5 drinks of alcohol every other day that he last had alcohol last night but does not feel that he was going through withdrawals.  Patient notes that in the last time he left after receiving blood transfusions patient would like to stay at this time.    Home Medications Prior to Admission medications   Medication Sig Start Date End Date Taking? Authorizing Provider  Ascorbic Acid (VITAMIN C PO) Take 1 tablet by mouth daily. Patient not taking: Reported on 05/15/2023    [provider]  Cholecalciferol (VITAMIN D-3 PO) Take 1 capsule by mouth daily. Patient not taking: Reported on 05/15/2023    [provider]  diphenhydramine-acetaminophen (TYLENOL PM) 25-500 MG TABS tablet Take 2 tablets by mouth at bedtime as needed (sleep, pain). Patient not taking: Reported on 05/15/2023    [provider]  ELDERBERRY PO Take 1 capsule by mouth daily. Patient not taking: Reported on 05/15/2023    [provider]  folic acid (FOLVITE) 1 MG tablet Take 1 tablet (1 mg total) by mouth daily. Patient not taking: Reported on 05/15/2023 12/23/22   Maretta Bees, MD  ketorolac (TORADOL)  10 MG tablet Take 1 tablet (10 mg total) by mouth every 6 (six) hours as needed (pain). Patient not taking: Reported on 05/15/2023 03/29/23   [name], MD  meclizine (ANTIVERT) 25 MG tablet Take 1 tablet (25 mg total) by mouth 3 (three) times daily as needed for dizziness. 05/15/23   [name], MD  Multiple Vitamin (MULTIVITAMIN WITH MINERALS) TABS tablet Take 1 tablet by mouth daily. Patient not taking: Reported on 05/15/2023 12/23/22   Maretta Bees, MD  pantoprazole (PROTONIX) 40 MG tablet Take 1 tablet (40 mg total) by mouth daily. Patient not taking: Reported on 05/15/2023 12/23/22   Maretta Bees, MD  thiamine (VITAMIN B1) 100 MG tablet Take 1 tablet (100 mg total) by mouth daily. Patient not taking: Reported on 05/15/2023 12/23/22   Maretta Bees, MD  VITAMIN E PO Take 1 capsule by mouth daily. Patient not taking: Reported on 05/15/2023    [provider]      Allergies    Patient has no known allergies.    Review of Systems   Review of Systems  Physical Exam Updated Vital Signs BP 120/64   Pulse (!) 123   Temp 98.4 F (36.9 C) (Oral)   Resp 19   SpO2 99%  Physical Exam Constitutional:      General: He is in acute distress.  Eyes:     Extraocular Movements: Extraocular movements intact.     Conjunctiva/sclera: Conjunctivae normal.     Pupils: Pupils are equal, round, and reactive to light.  Cardiovascular:     Rate and Rhythm: Regular rhythm. Tachycardia present.     Pulses: Normal pulses.     Heart sounds: Normal heart sounds.  Pulmonary:     Effort: Pulmonary effort is normal.     Breath sounds: Normal breath sounds.  Abdominal:     Palpations: Abdomen is soft.     Tenderness: There is abdominal tenderness (Generalized). There is no guarding or rebound.  Genitourinary:    Comments: Chaperone: Mike, RN Obvious bright red blood on the rectal area No fissure or fistula noted Rectal tone intact Skin:    General: Skin is warm  and dry.     Capillary Refill: Capillary refill takes more than 3 seconds.     Coloration: Skin is pale.  Neurological:     Mental Status: He is alert and oriented to person, place, and time.     Comments: No tongue fasciculations  Psychiatric:        Mood and Affect: Mood normal.     ED Results / Procedures / Treatments   Labs (all labs ordered are listed, but only abnormal results are displayed) Labs Reviewed  COMPREHENSIVE METABOLIC PANEL - Abnormal; Notable for the following components:      Result Value   CO2 17 (*)    Glucose, Bld 154 (*)    Calcium 8.0 (*)    Albumin 3.3 (*)    AST 95 (*)    Alkaline Phosphatase 24 (*)    Total Bilirubin 1.6 (*)    All other components within normal limits  PROTIME-INR - Abnormal; Notable for the following components:   Prothrombin Time 17.8 (*)    INR 1.5 (*)    All other components within normal limits  LIPASE, BLOOD  CBC WITH DIFFERENTIAL/PLATELET  POC OCCULT BLOOD, ED  I-STAT CG4 LACTIC ACID, ED  TYPE AND SCREEN    EKG None  Radiology DG Abd Acute W/Chest  Result Date: 06/06/2023 CLINICAL DATA:  Concern for GI bleed. EXAM: DG ABDOMEN ACUTE WITH 1 VIEW CHEST COMPARISON:  Chest radiograph dated May 27, 2022. FINDINGS: There is no evidence of dilated bowel loops or free intraperitoneal air. No radiopaque calculi or other significant radiographic abnormality is seen. Overlapping telemetry wires. Heart size and mediastinal contours are within normal limits. Hazy opacities at the right lung base. No pleural effusion or pneumothorax. IMPRESSION: 1. Negative abdominal radiographs. Nonobstructive bowel-gas pattern. 2. Hazy opacities at the right lung base may be secondary to an infectious/inflammatory etiology. Electronically Signed   By: Hart Robinsons M.D.   On: 06/06/2023 14:06    Procedures .Critical Care  Performed by: Netta Corrigan, PA-C Authorized by: Netta Corrigan, PA-C   Critical care provider statement:     Critical care time (minutes):  40   Critical care time was exclusive of:  Separately billable procedures and treating other patients   Critical care was necessary to treat or prevent imminent or life-threatening deterioration of the following conditions: Anemia requiring transfusion.   Critical care was time spent personally by me on the following activities:  Blood draw for specimens, development of treatment plan with patient or surrogate, evaluation of patient's response to treatment, obtaining history from patient or surrogate, review of old charts, re-evaluation of patient's condition, pulse oximetry, ordering and review of radiographic studies, ordering and review of laboratory studies, ordering and performing treatments and interventions and examination of patient   I assumed direction of critical care for this patient from another provider in  my specialty: no     Care discussed with comment:  Oncoming provider     Medications Ordered in ED Medications  pantoprazole (PROTONIX) injection 40 mg (40 mg Intravenous Given 06/06/23 1429)  iohexol (OMNIPAQUE) 350 MG/ML injection 100 mL (100 mLs Intravenous Contrast Given 06/06/23 1350)    ED Course/ Medical Decision Making/ A&P                                 Medical Decision Making Amount and/or Complexity of Data Reviewed Labs: ordered. Radiology: ordered.  Risk Prescription drug management.   Stephen Bowers 15 y.o. presented today for GIB. Working DDx that I considered at this time includes, but not limited to, Esophagitis, Mallory Weiss/Boerhaave, Variceal bleeding, PUD/gastritis/ulcers, diverticular bleed, colon cancer, rectal bleed, internal/external hemorrhoids  R/o DDx: Pending  Review of prior external notes: 05/22/2023 ED  Unique Tests and My Interpretation:  CBC: Hemoglobin 4.6, platelets 13 CMP: Slightly elevated total bilirubin otherwise unremarkable Lipase: Unremarkable PT/INR: Pending I-STAT lactic acid:  2.4 POC occult card: Positive Type and screen: Pending Acute abdomen chest x-ray: Opacities noted the right lower lung base CT angio GI bleed: pending  Social Determinants of Health: none  Discussion with Independent Historian:  EMS  Discussion of Management of Tests: none  Risk: High: hospitalization or escalation of hospital-level care  Risk Stratification Score: None  Staffed with Eloise Harman, MD  Plan: On exam patient was in acute distress and tachycardic 121.  Patient does have generalized tenderness on exam but otherwise had reassuring exam.  Patient does have history of GI bleeds along with anemia requiring transfusion and so given his history of melena will give Protonix and obtain Hemoccults and labs.  Patient did have abdominal tenderness and so we will get CT angio GI bleed.  Patient is tachycardic in the 120s however is not having tremors does not feel that he is in alcohol withdrawal.  Do feel the tachycardia is related to the melena.  Will obtain type and screen as well.  Rectal exam was conducted with a chaperone does show frank bright red blood throughout the rectal area however no fissures or fistulas were noted or any other rectal abnormalities.  I spoke to the patient about the possibility of blood transfusions as we are still waiting his hematocrit and hemoglobin level. Patient does not have any religious beliefs that would restrict him from receiving blood. I spoke to the patient about the need for a blood transfusion and the risks associated with this such as transfusion reaction, allergic reaction, incompatibility, hemolysis, circulatory overload, etc.  Patient verbalized understanding and acceptance of these risks. Conversation witnessed by Kathlene November, Charity fundraiser. Type and screen has already been ordered. Patient's hemoglobin is 4.6 and so we will give 3 units along with 1 unit of platelets as his platelets are 13.  Lower GI was consulted given physical exam showing gross  hematochezia is he will need scope.  At this time still waiting some labs however patient will be signed out to the next provider.  Lactic acid was also elevated with the anemia however this is most likely secondary to the anemia.  Patient signed out to [place], [place].  Please review their note for the continuation of patient's care.  The plan at this point is speak with GI for patient to be admitted as he most likely has a GI bleed causing his symptoms.  This chart was dictated using voice recognition  software.  Despite best efforts to proofread,  errors can occur which can change the documentation meaning.         Final Clinical Impression(s) / ED Diagnoses Final diagnoses:  Acute GI bleeding    Rx / DC Orders ED Discharge Orders     None         Remi Deter 06/06/23 1514    Netta Corrigan, PA-C 06/06/23 1525    Rondel Baton, MD 06/09/23 [phone]

## 2023-06-06 NOTE — H&P (Addendum)
History and Physical  Stephen Bowers KVQ:[id number] DOB: 07/17/1960 DOA: 06/06/2023  PCP: [name], MD   Chief Complaint: Bright red blood per rectum  HPI: Stephen Bowers is a 61 y.o. male with medical history significant for CKD, hypertension, suspected recent GI bleeding being admitted to the hospital with red blood per rectum, active GI bleed and severe symptomatic anemia.  Several months ago, he had a couple bouts of bright red blood per rectum, he was recently seen in the emergency department on 05/22/2023 for symptomatic anemia with a hemoglobin of 5.6.  He was transfused 2 units of blood, was feeling better, and discharged home with a plan for outpatient GI follow-up.  This has not yet happened.  He was doing well until last evening, when he started having painless bright red bleeding per rectum.  He had numerous bowel movements which were intermittently bright red blood, or dark red blood.  Denies any abdominal pain or nausea, though he did vomit once after arrival to the emergency department, and it was bright red.  While at home earlier this morning, he went to stand up in his room, fell onto the ground, lost consciousness for a few seconds.  Denies any chest pain, fevers, chills, or pain.  He is not on any blood thinners, has had no prior severe GI bleeding requiring blood transfusion until last month.  In the emergency department, lab work demonstrates severe anemia, thrombocytopenia (which is new).  He is hemodynamically stable, though tachycardic.  He was seen in consultation by interventional radiology after CT scan as below shows evidence of active bleed.  He was also seen by gastroenterology.  Hospitalist was contacted for admission.  Review of Systems: Please see HPI for pertinent positives and negatives. A complete 10 system review of systems are otherwise negative.  Past Medical History:  Diagnosis Date   Chronic musculoskeletal pain    CKD (chronic kidney disease)     follows with [name])   High cholesterol Dx 2011   Hypertension    Pancolitis (HCC)    Substance abuse (HCC)    Wears partial dentures    upper only   Past Surgical History:  Procedure Laterality Date   BIOPSY  07/06/2021   Procedure: BIOPSY;  Surgeon: [name], MD;  Location: [facility] ENDOSCOPY;  Service: Endoscopy;;   COLONOSCOPY  12/2014   hx polyps - Pyrtle   ESOPHAGOGASTRODUODENOSCOPY (EGD) WITH PROPOFOL N/A 07/06/2021   Dr [name]. [facility] ENDOSCOPY. findings: non-bleeding Duodenal ulcer, gastric erosions. Path: mild chronic gastritis, no H Pylori.   MULTIPLE TOOTH EXTRACTIONS      Social History:  reports that he quit smoking about 29 years ago. His smoking use included cigarettes. He started smoking about 35 years ago. He has a 1.5 pack-year smoking history. He has never used smokeless tobacco. He reports current alcohol use of about 6.0 standard drinks of alcohol per week. He reports that he does not use drugs.   No Known Allergies  Family History  Problem Relation Age of Onset   Liver disease Father    Alcohol abuse Father    Diabetes Sister    Obesity Sister    Cancer Mother        breast    Kidney disease Mother    Colon cancer Neg Hx    Esophageal cancer Neg Hx    Rectal cancer Neg Hx    Stomach cancer Neg Hx    Colon polyps Neg Hx  Prior to Admission medications   Medication Sig Start Date End Date Taking? Authorizing Provider  Ascorbic Acid (VITAMIN C PO) Take 1 tablet by mouth daily. Patient not taking: Reported on 05/15/2023    [provider]  Cholecalciferol (VITAMIN D-3 PO) Take 1 capsule by mouth daily. Patient not taking: Reported on 05/15/2023    [provider]  diphenhydramine-acetaminophen (TYLENOL PM) 25-500 MG TABS tablet Take 2 tablets by mouth at bedtime as needed (sleep, pain). Patient not taking: Reported on 05/15/2023    [provider]  ELDERBERRY PO Take 1 capsule by mouth daily. Patient  not taking: Reported on 05/15/2023    [provider]  folic acid (FOLVITE) 1 MG tablet Take 1 tablet (1 mg total) by mouth daily. Patient not taking: Reported on 05/15/2023 12/23/22   Maretta Bees, MD  ketorolac (TORADOL) 10 MG tablet Take 1 tablet (10 mg total) by mouth every 6 (six) hours as needed (pain). Patient not taking: Reported on 05/15/2023 03/29/23   [name], MD  meclizine (ANTIVERT) 25 MG tablet Take 1 tablet (25 mg total) by mouth 3 (three) times daily as needed for dizziness. 05/15/23   [name], MD  Multiple Vitamin (MULTIVITAMIN WITH MINERALS) TABS tablet Take 1 tablet by mouth daily. Patient not taking: Reported on 05/15/2023 12/23/22   Maretta Bees, MD  pantoprazole (PROTONIX) 40 MG tablet Take 1 tablet (40 mg total) by mouth daily. Patient not taking: Reported on 05/15/2023 12/23/22   Maretta Bees, MD  thiamine (VITAMIN B1) 100 MG tablet Take 1 tablet (100 mg total) by mouth daily. Patient not taking: Reported on 05/15/2023 12/23/22   Maretta Bees, MD  VITAMIN E PO Take 1 capsule by mouth daily. Patient not taking: Reported on 05/15/2023    [provider]    Physical Exam: BP 131/76 (BP Location: Right Arm)   Pulse (!) 127   Temp 98.9 F (37.2 C) (Oral)   Resp 18   SpO2 98%   General:  Alert, oriented, calm, in no acute distress, looks a little lethargic, but not comfortable, his wife is at the bedside Cardiovascular: Tachycardic and regular, no murmurs or rubs, no peripheral edema  Respiratory: clear to auscultation bilaterally, no wheezes, no crackles  Abdomen: soft, nontender, nondistended, normal bowel tones heard  Skin: dry, no rashes  Musculoskeletal: no joint effusions, normal range of motion  Psychiatric: appropriate affect, normal speech  Neurologic: extraocular muscles intact, clear speech, moving all extremities with intact sensorium         Labs on Admission:  Basic Metabolic Panel: Recent Labs   Lab 06/06/23 1424  NA 137  K 3.8  CL 107  CO2 17*  GLUCOSE 154*  BUN 13  CREATININE 0.97  CALCIUM 8.0*   Liver Function Tests: Recent Labs  Lab 06/06/23 1424  AST 95*  ALT 38  ALKPHOS 24*  BILITOT 1.6*  PROT 6.7  ALBUMIN 3.3*   Recent Labs  Lab 06/06/23 1424  LIPASE 24   No results for input(s): "AMMONIA" in the last 168 hours. CBC: Recent Labs  Lab 06/06/23 1424  WBC 11.8*  NEUTROABS 10.4*  HGB 4.6*  HCT 14.9*  MCV 80.5  PLT 13*   Cardiac Enzymes: No results for input(s): "CKTOTAL", "CKMB", "CKMBINDEX", "TROPONINI" in the last 168 hours.  BNP (last 3 results) No results for input(s): "BNP" in the last 8760 hours.  ProBNP (last 3 results) No results for input(s): "PROBNP" in the last 8760  hours.  CBG: No results for input(s): "GLUCAP" in the last 168 hours.  Radiological Exams on Admission: CT ANGIO GI BLEED  Result Date: 06/06/2023 CLINICAL DATA:  Suspect GI bleed EXAM: CTA ABDOMEN AND PELVIS WITHOUT AND WITH CONTRAST TECHNIQUE: Multidetector CT imaging of the abdomen and pelvis was performed using the standard protocol during bolus administration of intravenous contrast. Multiplanar reconstructed images and MIPs were obtained and reviewed to evaluate the vascular anatomy. RADIATION DOSE REDUCTION: This exam was performed according to the departmental dose-optimization program which includes automated exposure control, adjustment of the mA and/or kV according to patient size and/or use of iterative reconstruction technique. CONTRAST:  OMNIPAQUE IOHEXOL 350 MG/ML SOLN COMPARISON:  12/12/2022 FINDINGS: VASCULAR Normal contour and caliber of the abdominal aorta. Moderate mixed calcific atherosclerosis no evidence of aneurysm, dissection, or other acute aortic pathology. Duplicated left, solitary right renal arteries with otherwise standard branching pattern of the abdominal aorta. Review of the MIP images confirms the above findings. NON-VASCULAR Lower  Chest: Heterogeneous centrilobular nodular and ground-glass airspace opacity in the right-greater-than-left lung bases (series 4, image 2). Hepatobiliary: No solid liver abnormality is seen. Hepatic steatosis. No gallstones, gallbladder wall thickening, or biliary dilatation. Pancreas: Unremarkable. No pancreatic ductal dilatation or surrounding inflammatory changes. Spleen: Normal in size without significant abnormality. Adrenals/Urinary Tract: Adrenal glands are unremarkable. Unchanged perinephric fat stranding. Benign hemorrhagic or proteinaceous cyst of the lateral midportion of the left kidney, for which no further follow-up or characterization is required. Kidneys are otherwise normal, without renal calculi, solid lesion, or hydronephrosis. Bladder is unremarkable. Stomach/Bowel: Stomach is within normal limits. Appendix appears normal. No evidence of bowel wall thickening, distention, or inflammatory changes. Small focus of endoluminal arterial contrast blush in the cecal base (series 6, image 159) Lymphatic: No enlarged abdominal or pelvic lymph nodes. Reproductive: No mass or other significant abnormality. Other: Small, fat containing bilateral inguinal hernias. No ascites. Musculoskeletal: No acute osseous findings. IMPRESSION: 1. Small focus of endoluminal arterial contrast blush in the cecal base, presumed nidus of GI bleeding. No obvious anatomic abnormality visible in this vicinity, possibly this reflects a small vascular malformation or ulceration. 2. Heterogeneous centrilobular nodular and ground-glass airspace opacity in the right-greater-than-left lung bases, consistent with infection or aspiration. 3. Hepatic steatosis. Aortic Atherosclerosis (ICD10-I70.0). Electronically Signed   By: Jearld Lesch M.D.   On: 06/06/2023 15:28   DG Abd Acute W/Chest  Result Date: 06/06/2023 CLINICAL DATA:  Concern for GI bleed. EXAM: DG ABDOMEN ACUTE WITH 1 VIEW CHEST COMPARISON:  Chest radiograph dated  May 27, 2022. FINDINGS: There is no evidence of dilated bowel loops or free intraperitoneal air. No radiopaque calculi or other significant radiographic abnormality is seen. Overlapping telemetry wires. Heart size and mediastinal contours are within normal limits. Hazy opacities at the right lung base. No pleural effusion or pneumothorax. IMPRESSION: 1. Negative abdominal radiographs. Nonobstructive bowel-gas pattern. 2. Hazy opacities at the right lung base may be secondary to an infectious/inflammatory etiology. Electronically Signed   By: Hart Robinsons M.D.   On: 06/06/2023 14:06    Assessment/Plan Stephen Bowers is a 62 y.o. male with medical history significant for CKD, hypertension, suspected recent GI bleeding being admitted to the hospital with red blood per rectum, active GI bleed and severe symptomatic anemia.   Bright red blood per rectum-with small focus of endoluminal arterial contrast blush in the cecal base.  He is hemodynamically stable, though tachycardic.  Concern for active GI bleed as mentioned.  He was actually  hospitalized in June for lower GI bleed, was transfused a total of 5 units of blood, was seen by GI at that time who planned outpatient colonoscopy however this was never done it seems. -Inpatient admission to stepdown -Continuous telemetry -Given tachycardia, will bolus 1 L normal saline wide open x 1 now -Discussed with bedside RN in the ED, will obtain second large-bore IV -Discussed with gastroenterology and IR at the bedside, anticipate IR intervention as soon as repeat CBC is back  Thrombocytopenia-his platelet count was normal just a couple weeks ago, but he does have a history of chronic thrombocytopenia likely related to alcohol abuse.  Replete platelet count confirms 13,000.  Discussed with Dr. Mosetta Putt of hematology. -Check DIC panel -Check immature platelet fraction -Dr. Mosetta Putt will chart review, and plan to see patient in consultation in the  morning  Acute blood loss anemia-due to lower GI bleed as mentioned -Transfuse 3 units PRBC, first unit is transfusing now -Monitor every 8 hours hemoglobin -Will plan to transfuse to keep hemoglobin greater than 7  Lactate 2.4-I suspect this is due to poor perfusion in the setting of severe anemia, sepsis is not suspected.  Will repeat this in a couple hours after resuscitation.  DVT prophylaxis: SCDs only  Full code  Consults called: Gastroenterology, interventional radiology  Admission status: The appropriate patient status for this patient is INPATIENT. Inpatient status is judged to be reasonable and necessary in order to provide the required intensity of service to ensure the patient's safety. The patient's presenting symptoms, physical exam findings, and initial radiographic and laboratory data in the context of their chronic comorbidities is felt to place them at high risk for further clinical deterioration. Furthermore, it is not anticipated that the patient will be medically stable for discharge from the hospital within 2 midnights of admission.    I certify that at the point of admission it is my clinical judgment that the patient will require inpatient hospital care spanning beyond 2 midnights from the point of admission due to high intensity of service, high risk for further deterioration and high frequency of surveillance required  Time spent: 59 minutes  Aniceto Kyser Sharlette Dense MD [facility] Pager [phone]  If 7PM-7AM, please contact night-coverage [email or web address] Password [facility]  06/06/2023, 4:29 PM

## 2023-06-07 ENCOUNTER — Inpatient Hospital Stay (HOSPITAL_COMMUNITY): Payer: 59

## 2023-06-07 ENCOUNTER — Encounter (HOSPITAL_COMMUNITY): Payer: Self-pay | Admitting: Internal Medicine

## 2023-06-07 DIAGNOSIS — F10939 Alcohol use, unspecified with withdrawal, unspecified: Secondary | ICD-10-CM | POA: Diagnosis not present

## 2023-06-07 DIAGNOSIS — Z860101 Personal history of adenomatous and serrated colon polyps: Secondary | ICD-10-CM | POA: Diagnosis not present

## 2023-06-07 DIAGNOSIS — K922 Gastrointestinal hemorrhage, unspecified: Secondary | ICD-10-CM | POA: Diagnosis not present

## 2023-06-07 DIAGNOSIS — D62 Acute posthemorrhagic anemia: Secondary | ICD-10-CM | POA: Diagnosis not present

## 2023-06-07 LAB — BASIC METABOLIC PANEL
Anion gap: 10 (ref 5–15)
BUN: 14 mg/dL (ref 6–20)
CO2: 19 mmol/L — ABNORMAL LOW (ref 22–32)
Calcium: 7.9 mg/dL — ABNORMAL LOW (ref 8.9–10.3)
Chloride: 111 mmol/L (ref 98–111)
Creatinine, Ser: 0.94 mg/dL (ref 0.61–1.24)
GFR, Estimated: >60 mL/min (ref >60)
Glucose, Bld: 119 mg/dL — ABNORMAL HIGH (ref 70–99)
Potassium: 3.7 mmol/L (ref 3.5–5.1)
Sodium: 140 mmol/L (ref 135–145)

## 2023-06-07 LAB — CBC WITH DIFFERENTIAL/PLATELET
Abs Immature Granulocytes: 0.14 10*3/uL — ABNORMAL HIGH (ref 0.00–0.07)
Basophils Absolute: 0.1 10*3/uL (ref 0.0–0.1)
Basophils Relative: 0 %
Eosinophils Absolute: 0.1 10*3/uL (ref 0.0–0.5)
Eosinophils Relative: 0 %
HCT: 20.9 % — ABNORMAL LOW (ref 39.0–52.0)
Hemoglobin: 7.2 g/dL — ABNORMAL LOW (ref 13.0–17.0)
Immature Granulocytes: 1 %
Lymphocytes Relative: 9 %
Lymphs Abs: 1.3 10*3/uL (ref 0.7–4.0)
MCH: 28.5 pg (ref 26.0–34.0)
MCHC: 34.4 g/dL (ref 30.0–36.0)
MCV: 82.6 fL (ref 80.0–100.0)
Monocytes Absolute: 0.7 10*3/uL (ref 0.1–1.0)
Monocytes Relative: 5 %
Neutro Abs: 11.6 10*3/uL — ABNORMAL HIGH (ref 1.7–7.7)
Neutrophils Relative %: 85 %
Platelets: 34 10*3/uL — ABNORMAL LOW (ref 150–400)
RBC: 2.53 MIL/uL — ABNORMAL LOW (ref 4.22–5.81)
RDW: 18.4 % — ABNORMAL HIGH (ref 11.5–15.5)
WBC: 13.8 10*3/uL — ABNORMAL HIGH (ref 4.0–10.5)
nRBC: 0 % (ref 0.0–0.2)

## 2023-06-07 LAB — HEMOGLOBIN AND HEMATOCRIT, BLOOD
HCT: 21 % — ABNORMAL LOW (ref 39.0–52.0)
HCT: 21.4 % — ABNORMAL LOW (ref 39.0–52.0)
Hemoglobin: 7.3 g/dL — ABNORMAL LOW (ref 13.0–17.0)
Hemoglobin: 7.3 g/dL — ABNORMAL LOW (ref 13.0–17.0)

## 2023-06-07 LAB — HIV ANTIBODY (ROUTINE TESTING W REFLEX): HIV Screen 4th Generation wRfx: NONREACTIVE

## 2023-06-07 LAB — PREPARE RBC (CROSSMATCH)

## 2023-06-07 LAB — CBC
HCT: 20.4 % — ABNORMAL LOW (ref 39.0–52.0)
Hemoglobin: 6.8 g/dL — CL (ref 13.0–17.0)
MCH: 27.6 pg (ref 26.0–34.0)
MCHC: 33.3 g/dL (ref 30.0–36.0)
MCV: 82.9 fL (ref 80.0–100.0)
Platelets: 28 10*3/uL — CL (ref 150–400)
RBC: 2.46 MIL/uL — ABNORMAL LOW (ref 4.22–5.81)
RDW: 18 % — ABNORMAL HIGH (ref 11.5–15.5)
WBC: 15.3 10*3/uL — ABNORMAL HIGH (ref 4.0–10.5)
nRBC: 0.2 % (ref 0.0–0.2)

## 2023-06-07 LAB — BPAM PLATELET PHERESIS
Blood Product Expiration Date: 202412062359
ISSUE DATE / TIME: 202412041945
Unit Type and Rh: 7300

## 2023-06-07 LAB — TECHNOLOGIST SMEAR REVIEW: Plt Morphology: DECREASED

## 2023-06-07 LAB — DIRECT ANTIGLOBULIN TEST (NOT AT ARMC)
DAT, IgG: NEGATIVE
DAT, complement: NEGATIVE

## 2023-06-07 LAB — PREPARE PLATELET PHERESIS: Unit division: 0

## 2023-06-07 LAB — LACTIC ACID, PLASMA: Lactic Acid, Venous: 1 mmol/L (ref 0.5–1.9)

## 2023-06-07 MED ORDER — SODIUM CHLORIDE 0.9% IV SOLUTION: Freq: Once | INTRAVENOUS | Status: DC

## 2023-06-07 MED ORDER — ACETAMINOPHEN 325 MG PO TABS
650.0000 mg | ORAL_TABLET | Freq: Once | ORAL | Status: AC
  Administered 2023-06-07: 650 mg via ORAL
  Filled 2023-06-07: qty 2

## 2023-06-07 MED ORDER — ORAL CARE MOUTH RINSE: 15.0000 mL | OROMUCOSAL | Status: DC | PRN

## 2023-06-07 MED ORDER — LORAZEPAM 2 MG/ML IJ SOLN
1.0000 mg | INTRAMUSCULAR | Status: DC | PRN
  Administered 2023-06-08: 3 mg via INTRAVENOUS
  Administered 2023-06-08: 2 mg via INTRAVENOUS
  Filled 2023-06-07: qty 1
  Filled 2023-06-07: qty 2

## 2023-06-07 MED ORDER — OXYMETAZOLINE HCL 0.05 % NA SOLN
1.0000 | NASAL | Status: DC
  Filled 2023-06-07: qty 15

## 2023-06-07 MED ORDER — ADULT MULTIVITAMIN W/MINERALS CH
1.0000 | ORAL_TABLET | Freq: Every day | ORAL | Status: DC
  Administered 2023-06-07 – 2023-06-08 (×2): 1 via ORAL
  Filled 2023-06-07 (×2): qty 1

## 2023-06-07 MED ORDER — SODIUM CHLORIDE 0.9% IV SOLUTION: Freq: Once | INTRAVENOUS | Status: AC

## 2023-06-07 MED ORDER — IOHEXOL 300 MG/ML  SOLN
80.0000 mL | Freq: Once | INTRAMUSCULAR | Status: AC | PRN
  Administered 2023-06-07: 80 mL via INTRAVENOUS

## 2023-06-07 MED ORDER — FUROSEMIDE 10 MG/ML IJ SOLN
20.0000 mg | Freq: Once | INTRAMUSCULAR | Status: AC
  Administered 2023-06-07: 20 mg via INTRAVENOUS
  Filled 2023-06-07: qty 2

## 2023-06-07 MED ORDER — METHYLPREDNISOLONE SODIUM SUCC 125 MG IJ SOLR
125.0000 mg | Freq: Every day | INTRAMUSCULAR | Status: DC
  Administered 2023-06-07 – 2023-06-08 (×2): 125 mg via INTRAVENOUS
  Filled 2023-06-07 (×2): qty 2

## 2023-06-07 MED ORDER — SODIUM CHLORIDE 0.9 % IV SOLN
3.0000 g | Freq: Four times a day (QID) | INTRAVENOUS | Status: DC
  Administered 2023-06-07 – 2023-06-09 (×8): 3 g via INTRAVENOUS
  Filled 2023-06-07 (×10): qty 8

## 2023-06-07 MED ORDER — LORAZEPAM 1 MG PO TABS
1.0000 mg | ORAL_TABLET | ORAL | Status: DC | PRN
  Administered 2023-06-08: 3 mg via ORAL
  Filled 2023-06-07: qty 3

## 2023-06-07 MED ORDER — THIAMINE MONONITRATE 100 MG PO TABS
100.0000 mg | ORAL_TABLET | Freq: Every day | ORAL | Status: DC
  Administered 2023-06-07 – 2023-06-08 (×2): 100 mg via ORAL
  Filled 2023-06-07 (×2): qty 1

## 2023-06-07 MED ORDER — DIPHENHYDRAMINE HCL 25 MG PO CAPS
25.0000 mg | ORAL_CAPSULE | Freq: Once | ORAL | Status: AC
  Administered 2023-06-07: 25 mg via ORAL
  Filled 2023-06-07: qty 1

## 2023-06-07 MED ORDER — VITAMIN K1 10 MG/ML IJ SOLN
10.0000 mg | Freq: Once | INTRAVENOUS | Status: AC
  Administered 2023-06-07: 10 mg via INTRAVENOUS
  Filled 2023-06-07: qty 1

## 2023-06-07 MED ORDER — THIAMINE HCL 100 MG/ML IJ SOLN
100.0000 mg | Freq: Every day | INTRAMUSCULAR | Status: DC
Start: 2023-06-07 — End: 2023-06-10
  Administered 2023-06-09: 100 mg via INTRAVENOUS
  Filled 2023-06-07: qty 2

## 2023-06-07 MED ORDER — FOLIC ACID 1 MG PO TABS
1.0000 mg | ORAL_TABLET | Freq: Every day | ORAL | Status: DC
Start: 2023-06-07 — End: 2023-06-10
  Administered 2023-06-07 – 2023-06-08 (×2): 1 mg via ORAL
  Filled 2023-06-07 (×2): qty 1

## 2023-06-07 MED ORDER — OXYMETAZOLINE HCL 0.05 % NA SOLN: 1.0000 | NASAL | Status: DC | PRN

## 2023-06-07 NOTE — Progress Notes (Signed)
Received call from ICU RN pt had bloody stool.  Informed J Slate IR Tech and Jeananne Rama PA-C

## 2023-06-07 NOTE — Progress Notes (Addendum)
Progress Note  Primary GI: Dr. [name] DOA: 06/06/2023         Hospital Day: 2   Subjective  Chief Complaint:  Acute GI bleed, dark red blood per rectum at home x 2 large-volume, syncope   No family was present at the time of my evaluation. Patient states last bloody bowel movement was last night at 9 PM has not had any further bowel movement since that time. He did however have a nosebleed this morning which he states he has had since he was young kid. Also states potential family history of bleeding issues. Denies nausea, vomiting, fever, chills, abdominal pain.    Objective   Vital signs in last 24 hours: Temp:  [98.2 F (36.8 C)-100.4 F (38 C)] 99.2 F (37.3 C) (12/05 0820) Pulse Rate:  [83-141] 88 (12/05 0820) Resp:  [12-25] 15 (12/05 0820) BP: (94-149)/(46-96) 149/96 (12/05 0820) SpO2:  [96 %-100 %] 98 % (12/05 0820) Weight:  [85.3 kg] 85.3 kg (12/04 2001) Last BM Date : 06/06/23 Last BM recorded by nurses in past 5 days Stool Type: Type 6 (Mushy consistency with ragged edges) (06/06/2023  8:30 PM)  General:   male in no acute distress  Heart:  Regular rate and rhythm; no murmurs Pulm: Clear anteriorly; no wheezing Abdomen:  Soft, Obese AB, Active bowel sounds. No tenderness Extremities:  without  edema. Neurologic:  Alert and  oriented x4;  No focal deficits.  Psych:  Cooperative. Normal mood and affect.  Intake/Output from previous day: 12/04 0701 - 12/05 0700 In: 1260 [Blood:1260] Out: 200 [Urine:200] Intake/Output this shift: Total I/O In: 290 [Blood:290] Out: 120 [Urine:120]  Studies/Results: CT ANGIO GI BLEED  Result Date: 06/06/2023 CLINICAL DATA:  Suspect GI bleed EXAM: CTA ABDOMEN AND PELVIS WITHOUT AND WITH CONTRAST TECHNIQUE: Multidetector CT imaging of the abdomen and pelvis was performed using the standard protocol during bolus administration of intravenous contrast. Multiplanar reconstructed images and MIPs were obtained and reviewed to  evaluate the vascular anatomy. RADIATION DOSE REDUCTION: This exam was performed according to the departmental dose-optimization program which includes automated exposure control, adjustment of the mA and/or kV according to patient size and/or use of iterative reconstruction technique. CONTRAST:  OMNIPAQUE IOHEXOL 350 MG/ML SOLN COMPARISON:  12/12/2022 FINDINGS: VASCULAR Normal contour and caliber of the abdominal aorta. Moderate mixed calcific atherosclerosis no evidence of aneurysm, dissection, or other acute aortic pathology. Duplicated left, solitary right renal arteries with otherwise standard branching pattern of the abdominal aorta. Review of the MIP images confirms the above findings. NON-VASCULAR Lower Chest: Heterogeneous centrilobular nodular and ground-glass airspace opacity in the right-greater-than-left lung bases (series 4, image 2). Hepatobiliary: No solid liver abnormality is seen. Hepatic steatosis. No gallstones, gallbladder wall thickening, or biliary dilatation. Pancreas: Unremarkable. No pancreatic ductal dilatation or surrounding inflammatory changes. Spleen: Normal in size without significant abnormality. Adrenals/Urinary Tract: Adrenal glands are unremarkable. Unchanged perinephric fat stranding. Benign hemorrhagic or proteinaceous cyst of the lateral midportion of the left kidney, for which no further follow-up or characterization is required. Kidneys are otherwise normal, without renal calculi, solid lesion, or hydronephrosis. Bladder is unremarkable. Stomach/Bowel: Stomach is within normal limits. Appendix appears normal. No evidence of bowel wall thickening, distention, or inflammatory changes. Small focus of endoluminal arterial contrast blush in the cecal base (series 6, image 159) Lymphatic: No enlarged abdominal or pelvic lymph nodes. Reproductive: No mass or other significant abnormality. Other: Small, fat containing bilateral inguinal hernias. No ascites. Musculoskeletal: No  acute osseous  findings. IMPRESSION: 1. Small focus of endoluminal arterial contrast blush in the cecal base, presumed nidus of GI bleeding. No obvious anatomic abnormality visible in this vicinity, possibly this reflects a small vascular malformation or ulceration. 2. Heterogeneous centrilobular nodular and ground-glass airspace opacity in the right-greater-than-left lung bases, consistent with infection or aspiration. 3. Hepatic steatosis. Aortic Atherosclerosis (ICD10-I70.0). Electronically Signed   By: Jearld Lesch M.D.   On: 06/06/2023 15:28   DG Abd Acute W/Chest  Result Date: 06/06/2023 CLINICAL DATA:  Concern for GI bleed. EXAM: DG ABDOMEN ACUTE WITH 1 VIEW CHEST COMPARISON:  Chest radiograph dated May 27, 2022. FINDINGS: There is no evidence of dilated bowel loops or free intraperitoneal air. No radiopaque calculi or other significant radiographic abnormality is seen. Overlapping telemetry wires. Heart size and mediastinal contours are within normal limits. Hazy opacities at the right lung base. No pleural effusion or pneumothorax. IMPRESSION: 1. Negative abdominal radiographs. Nonobstructive bowel-gas pattern. 2. Hazy opacities at the right lung base may be secondary to an infectious/inflammatory etiology. Electronically Signed   By: Hart Robinsons M.D.   On: 06/06/2023 14:06    Lab Results: Recent Labs    06/06/23 1424 06/06/23 1617 06/06/23 1857 06/07/23 0258  WBC 11.8* 17.2*  --  15.3*  HGB 4.6* 4.6*  --  6.8*  HCT 14.9* 15.4*  --  20.4*  PLT 13* 13* 12* 28*   BMET Recent Labs    06/06/23 1424 06/07/23 0258  NA 137 140  K 3.8 3.7  CL 107 111  CO2 17* 19*  GLUCOSE 154* 119*  BUN 13 14  CREATININE 0.97 0.94  CALCIUM 8.0* 7.9*   LFT Recent Labs    06/06/23 1424  PROT 6.7  ALBUMIN 3.3*  AST 95*  ALT 38  ALKPHOS 24*  BILITOT 1.6*   PT/INR Recent Labs    06/06/23 1424 06/06/23 1857  LABPROT 17.8* 17.1*  INR 1.5* 1.4*     Scheduled Meds:  sodium  chloride   Intravenous Once   Chlorhexidine Gluconate Cloth  6 each Topical Q0600   mouth rinse  15 mL Mouth Rinse 4 times per day   oxymetazoline  1 spray Each Nare Q2H   pantoprazole (PROTONIX) IV  40 mg Intravenous Q12H   Continuous Infusions:  phytonadione (VITAMIN K) 10 mg in dextrose 5 % 50 mL IVPB     sodium chloride        Patient profile:   62 year old AA male history of alcohol abuse, acute lower GI bleed 2 large-volume dark red bloody bowel movements, Hgb 4.6 on arrival, CTA positive cecal base   Impression/Plan:   Acute lower GI bleed Unremarkable colonoscopy 2019 Dr. [name] Previous GI bleed June 2024 CT negative no endoscopic evaluation Hgb 4.6 status post 4 units PRBC Hgb 6.8 1 completed units of platelets transfusing second unit CTA positive for arterial blush in cecal base secondary to AVM versus mucosal ulceration --Continue to monitor H&H with transfusion as needed to maintain hemoglobin greater than 7. -IR with embolization for positive CTA was patient optimized and if patient continues to have bleeding -No plan for endoscopic evaluation at this time  Profound thrombocytopenia Platelets 13,000 status post 1 platelets, transfusing second Platelets this morning at 28 Recheck platelets, ideally 1 above 50 for IR procedure -Possible ITP versus ETOH ( no cirrhosis on CTA), hematology consulted -Continue to transfuse platelets to goal of 50  History of alcohol abuse No evidence of cirrhosis on CT angio Counseled on ETOH cessation  History of duodenal ulcer January 2023 1 EGD Continue Protonix IV 40 mg twice daily  Principal Problem:   Lower GI bleed Active Problems:   Abnormal CT of the abdomen    LOS: 1 day   Doree Albee  06/07/2023, 8:42 AM  GI ATTENDING  Interval history data reviewed.  Patient personally seen and examined.  Agree with interval progress note as outlined above.  Currently stable.  Recurrent bleeding from cecal lesion.   Continue close observation, transfuse as needed, address thrombocytopenia, and IR if possible for recurrent bleeding.  Will follow.  Wilhemina Bonito. Eda Keys., M.D. [facility] Division of Gastroenterology

## 2023-06-07 NOTE — Progress Notes (Signed)
HOSPITALIST ROUNDING NOTE Stephen Bowers KVQ:[id number]  DOB: 03-20-1961  DOA: 06/06/2023  PCP: [name], MD  06/07/2023,6:59 AM   LOS: 1 day      Code Status: Full code From: Home  current Dispo: Likely home     62 year old black male Previous EtOH complicated by DTs, severe thrombocytopenia HTN HLD-prior duodenal ulcer without active bleed gastric erosions status post EGD 1/23 admission-previous colonoscopy 2019 2-5 mm polyps rectum, transverse small internal hemorrhoids Still drinks about 5 drinks a day not daily Came to ED initially 05/22/2023 fatigue lightheaded no active bleed transfused 2 units sent home platelets 275 Return to ED 06/06/2023 large rectal bleed syncopal episode last EtOH 12/3 CT angio = small focus endoluminal arterial contrast blush cecal base hemoglobin 4.6 platelet 13 INR 1.5--transfused about 3 units PRBC GI consulted Interventional radiology consulted  Large bleed again early a.m. 12/5 transfused 1 more unit platelets this morning and then 1 more unit of blood this afternoon  Plan  Large GI bleed with hypovolemic shock on arrival (tachycardic hypotensive) IR/GI on standby for workup although no urgency to dictate scope for endoluminal arterial blush seen at cecum Supportive transfusions for hemoglobin below 8  Elevated D-dimer on arrival History not in keeping with DVT/PE Nonemergent scan abdomen chest pelvis with contrast later today as below  Epistaxis--Anterior nasal bleed---" wet"platelet bleeding--- this has happened to him several times before Discussed with ENT Dr.Hoshal--- no need for rhino rocket as blood seems to have dried up with Afrin around-the-clock and this has to be enforced to the patient at least for the next 24 hours until his platelets rebound 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 --platelet morphology is normal only showing target cells no giant megakaryocytes --there is no splenic sequestration on prior CT abdomen pelvis 05/2022 although there is  early cirrhosis noted given the fact  that 12/12/2022 platelet 27 (prior At 583 November 2023) with platelets in the low 20 range we await oncology input to give [place] some guidance--- will complete workup with HCV and HIV suspect that he may have a possible malignancy versus inherited platelet disorder -- CT chest ordered D/w Dr Antony Haste DAT/ADAMSt, and will give a dose solu-medrol 125 now  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 Chronic EtOH 5 drinks or so per day Early cirrhosis Heavy drinker in the past but has not had a drink since the past "week" he says He is slightly tremulous I will order CIWA alone and we can place him on ativan while in ICU  Low-grade fever on arrival unclear if septic Lactic acidosis on arrival Probably secondary to hypovolemia-unclear if septic -start Zosyn-chest CT now-peripheral smear shows a predominant leukocytosis/left shift  Ordering HIV/HCV as above  Metabolic acidosis nongap with lactic acidosis Improving slowly  CRITICAL CARE Performed by: Rhetta Mura   Total critical care time: 50 minutes  Critical care time was exclusive of separately billable procedures and treating other patients.  Critical care was necessary to treat or prevent imminent or life-threatening deterioration.  Critical care was time spent personally by me on the following activities: development of treatment plan with patient and/or surrogate as well as nursing, discussions with consultants, evaluation of patient's response to treatment, examination of patient, obtaining history from patient or surrogate, ordering and performing treatments and interventions, ordering and review of laboratory studies, ordering and review of radiographic studies, pulse oximetry and re-evaluation of patient's condition.   DVT prophylaxis: SCD  Status is: Inpatient Remains inpatient appropriate because:   Remains relatively sick  Subjective: Seen at the bedside with active nasal  bleeding that was oozing copiously but no clots bright red Patient was told to keep his head back and Afrin was applied with good effect after discussion with ENT He is quite tremulous   Objective + exam Vitals:   06/07/23 0500 06/07/23 0517 06/07/23 0519 06/07/23 0534  BP:  (!) 135/53 (!) 135/53 107/62  Pulse: 91 88 98 93  Resp: (!) 25 16 14 16   Temp:  98.8 F (37.1 C) 98.8 F (37.1 C) 98.5 F (36.9 C)  TempSrc:  Oral Oral Oral  SpO2: 98% 98% 96% 99%  Weight:      Height:       Filed Weights   06/06/23 2001  Weight: 85.3 kg    Examination:  Awake black male no distress coherent slightly jittery S1-S2 no murmur No shifting dullness poor exam to abdomen likely will be No lower extremity edema No petechiae no purpura  Data Reviewed: reviewed   CBC    Component Value Date/Time   WBC 15.3 (H) 06/07/2023 0258   RBC 2.46 (L) 06/07/2023 0258   HGB 6.8 (LL) 06/07/2023 0258   HGB 5.6 (LL) 05/15/2023 1440   HCT 20.4 (L) 06/07/2023 0258   HCT 18.2 (L) 05/15/2023 1440   PLT 28 (LL) 06/07/2023 0258   PLT 121 (L) 05/15/2023 1440   MCV 82.9 06/07/2023 0258   MCV 84 05/15/2023 1440   MCH 27.6 06/07/2023 0258   MCHC 33.3 06/07/2023 0258   RDW 18.0 (H) 06/07/2023 0258   RDW 20.7 (H) 05/15/2023 1440   LYMPHSABS 0.7 06/06/2023 1424   LYMPHSABS 1.8 05/15/2023 1440   MONOABS 0.7 06/06/2023 1424   EOSABS 0.0 06/06/2023 1424   EOSABS 0.1 05/15/2023 1440   BASOSABS 0.0 06/06/2023 1424   BASOSABS 0.2 05/15/2023 1440      Latest Ref Rng & Units 06/07/2023    2:58 AM 06/06/2023    2:24 PM 05/22/2023    2:33 PM  CMP  Glucose 70 - 99 mg/dL 166  063  016   BUN 6 - 20 mg/dL 14  13  7    Creatinine 0.61 - 1.24 mg/dL 0.10  9.32  3.55   Sodium 135 - 145 mmol/L 140  137  137   Potassium 3.5 - 5.1 mmol/L 3.7  3.8  3.9   Chloride 98 - 111 mmol/L 111  107  105   CO2 22 - 32 mmol/L 19  17    Calcium 8.9 - 10.3 mg/dL 7.9  8.0    Total Protein 6.5 - 8.1 g/dL  6.7    Total Bilirubin <1.2  mg/dL  1.6    Alkaline Phos 38 - 126 U/L  24    AST 15 - 41 U/L  95    ALT 0 - 44 U/L  38       Scheduled Meds:  sodium chloride   Intravenous Once   sodium chloride   Intravenous Once   Chlorhexidine Gluconate Cloth  6 each Topical Q0600   mouth rinse  15 mL Mouth Rinse 4 times per day   pantoprazole (PROTONIX) IV  40 mg Intravenous Q12H   Continuous Infusions:  sodium chloride      Time  70  Rhetta Mura, MD  [facility]

## 2023-06-07 NOTE — Progress Notes (Signed)
Patient had 1 large frank red BM with clots. IR notified. Vital signs stable.

## 2023-06-07 NOTE — Plan of Care (Signed)

## 2023-06-07 NOTE — Progress Notes (Signed)
    Patient Name: Stephen Bowers           DOB: 1960-12-11  MRN: [id number]      Admission Date: 06/06/2023  Attending Provider: Maryln Gottron, MD  Primary Diagnosis: Lower GI bleed   Level of care: Stepdown    CROSS COVER NOTE   Date of Service   06/07/2023   Stephen Bowers, 62 y.o. male, was admitted on 06/06/2023 for Lower GI bleed.    HPI/Events of Note   Critical labs Hemoglobin 6.8- s/p 3 units PRBC Platelets 28- s/p 1 unit platelets.    Interventions/ Plan   Transfuse PRBC, 1 unit Transfused platelets, 1 unit IR consulted on admission, prior to angio, platelet count should be closer to 50K        Stephen Harada, DNP, ACNPC- AG [facility] [place]

## 2023-06-07 NOTE — Progress Notes (Signed)
Pharmacy Antibiotic Note  Stephen Bowers is a 62 y.o. male admitted on 06/06/2023 with aspiration PNA.  Pharmacy has been consulted for Unasyn dosing.  Plan: Unasyn 3 gm IV q6h  Height: 5\' 11"  (180.3 cm) Weight: 85.3 kg (188 lb 0.8 oz) IBW/kg (Calculated) : 75.3  Temp (24hrs), Avg:99.2 F (37.3 C), Min:98.4 F (36.9 C), Max:100.4 F (38 C)  Recent Labs  Lab 06/06/23 1424 06/06/23 1517 06/06/23 1617 06/06/23 1826 06/07/23 0258 06/07/23 1333  WBC 11.8*  --  17.2*  --  15.3* 13.8*  CREATININE 0.97  --   --   --  0.94  --   LATICACIDVEN  --  2.4*  --  2.6* 1.0  --     Estimated Creatinine Clearance: 89 mL/min (by C-G formula based on SCr of 0.94 mg/dL).    No Known Allergies  Antimicrobials this admission: 12/5 Unasyn>>  Dose adjustments this admission:  Microbiology results: 12/4 MRSA -  Thank you for allowing pharmacy to be a part of this patient's care.   [name], Pharm.D Use secure chat for questions 06/07/2023 4:28 PM

## 2023-06-07 NOTE — TOC Initial Note (Signed)
Transition of Care [facility]) - Initial/Assessment Note    Patient Details  Name: Stephen Bowers MRN: [id number] Date of Birth: Aug 05, 1960  [facility] CM/SW Contact:    Lanier Clam, RN Phone Number: 06/07/2023, 8:51 AM  Clinical Narrative:  /c plan home.                 Expected Discharge Plan: Home/Self Care Barriers to Discharge: Continued Medical Work up   Patient Goals and CMS Choice Patient states their goals for this hospitalization and ongoing recovery are:: Home CMS Medicare.gov Compare Post Acute Care list provided to:: Patient Choice offered to / list presented to : Patient [place] ownership interest in [facility].provided to:: Patient    Expected Discharge Plan and Services   Discharge Planning Services: CM Consult Post Acute Care Choice: Resumption of Svcs/PTA Provider Living arrangements for the past 2 months: Single Family Home                                      Prior Living Arrangements/Services Living arrangements for the past 2 months: Single Family Home Lives with:: Spouse Patient language and need for interpreter reviewed:: Yes Do you feel safe going back to the place where you live?: Yes      Need for Family Participation in Patient Care: Yes (Comment) Care giver support system in place?: Yes (comment)   Criminal Activity/Legal Involvement Pertinent to Current Situation/Hospitalization: No - Comment as needed  Activities of Daily Living   ADL Screening (condition at time of admission) Independently performs ADLs?: Yes (appropriate for developmental age) Is the patient deaf or have difficulty hearing?: No Does the patient have difficulty seeing, even when wearing glasses/contacts?: Yes Does the patient have difficulty concentrating, remembering, or making decisions?: No  Permission Sought/Granted Permission sought to share information with : Case Manager Permission granted to share information with : Yes,  Verbal Permission Granted  Share Information with NAME: Case Manager           Emotional Assessment Appearance:: Appears stated age            Admission diagnosis:  Thrombocytopenia (HCC) [D69.6] Acute GI bleeding [K92.2] Lower GI bleed [K92.2] Anemia, unspecified type [D64.9] Patient Active Problem List   Diagnosis Date Noted   Lower GI bleed 06/06/2023   Abnormal CT of the abdomen 06/06/2023   Alcohol use 12/13/2022   ABLA (acute blood loss anemia) 12/12/2022   Lactic acidosis 12/12/2022   Acute blood loss anemia 12/12/2022   Hematemesis with nausea 05/23/2022   Alcoholic cirrhosis of liver without ascites (HCC) 05/23/2022   Pneumonia due to COVID-19 virus 05/22/2022   Aspiration pneumonia (HCC) 05/22/2022   Alcoholic gastritis with bleeding 05/22/2022   Lactic acid acidosis 05/22/2022   Chronic liver failure (HCC) 05/22/2022   Gout 05/22/2022   Duodenal ulcer    Hematochezia    Iron deficiency anemia due to chronic blood loss    Symptomatic anemia 07/05/2021   URI (upper respiratory infection) 07/05/2021   Transaminitis 04/23/2020   Alcohol use disorder, severe, dependence (HCC) 04/23/2020   Acute gout of right elbow    Hyponatremia    Right elbow pain    Alcohol abuse    Fever    AMS (altered mental status)    Thrombocytopenia (HCC)    Seizure-like activity (HCC) 04/11/2020   Alcohol withdrawal seizure (HCC) 04/11/2020   Alcohol use disorder,  mild, abuse 02/24/2019   Alcoholic fatty liver 02/24/2019   Prediabetes 02/24/2019   Normocytic anemia 02/24/2019   Acute pancreatitis 12/20/2017   Increased anion gap metabolic acidosis    Severe sepsis (HCC)    Abnormal LFTs 12/04/2017   Pancreatitis 12/04/2017   Hyperlipidemia 10/16/2016   AKI (acute kidney injury) (HCC)    HTN (hypertension) 03/02/2015   Vitamin D deficiency 01/19/2015   Low back pain potentially associated with radiculopathy 03/30/2014   PCP:  [name], MD Pharmacy:   [facility]  MEDICAL [facility]ENTER - [facility] [address], [address] [place] [address] Phone: [phone] Fax: [phone]  [id number] - [place], [place] - [address] [place] [address] Phone: [phone] Fax: [phone]  [facility] [address] [place] [address] Phone: [phone] Fax: [phone]     Social Determinants of Health (SDOH) Social History: SDOH Screenings   Food Insecurity: No Food Insecurity (06/06/2023)  Housing: Low Risk  (06/06/2023)  Transportation Needs: No Transportation Needs (06/06/2023)  Utilities: Not At Risk (06/06/2023)  Alcohol Screen: Low Risk  (05/15/2023)  Depression (PHQ2-9): Low Risk  (05/15/2023)  Financial Resource Strain: Low Risk  (05/15/2023)  Physical Activity: Sufficiently Active (05/15/2023)  Social Connections: Moderately Isolated (05/15/2023)  Stress: No Stress Concern Present (05/15/2023)  Tobacco Use: Medium Risk (06/06/2023)  Health Literacy: Adequate Health Literacy (05/15/2023)   SDOH Interventions:     Readmission Risk Interventions     No data to display

## 2023-06-07 NOTE — Consult Note (Addendum)
[facility]  [phone] [phone]    HEMATOLOGY CONSULTATION  PURPOSE OF CONSULTATION/CHIEF COMPLAINT: Recurrent GI Bleed/ Anemia/Thrombocytopenia  Referring MD:  Dr. Mahala Menghini  HPI: This is a 62 year old male patient who came to the ED because of painless bright red bleeding from his rectum.  Patient states that about 2 weeks ago he started having bright red to dark stools however it was on and off.  Admits to fatigue and shortness of breath which states has improved today.  Denies abdominal pain nausea or vomiting.  Patient denies chest pain, fevers, or chills. Workup was done in the ED.  CBC showed a hemoglobin 4.6 with platelets 13,000.  Patient was transfused.  Seen by gastroenterology in consultation.   Past medical history is significant for previous GI bleeds and transfusions.  Patient has a hx of CKD, hypertension, and hyperlipidemia.  Social history includes ETOH abuse, patient admits to drinking 3 bottles of "plane-size" alcohol +3 bottles of beer every day.  Denies tobacco use.  Denies illicit drug use. Hematology consult now requested.   ASSESSMENT AND PLAN:   Recurrent GI bleed -CT angio done 06/06/23 shows small focus of endoluminal arterial contrast blush in cecal base likely source of bleed.  -history of previous gi bleeds.  Patient states that his last bowel bloody bowel movement was last night around 9 PM.  Denies any bloody bowel movement today  -pt seen by IR for possible embolization  -GI following  2.  Severe anemia -likely due to acute GI bleeding -admitted with hgb 4.6. s/p prbc transfusions -Hgb improving, 6.8-->7.3 most recent today -Recommend prbc transfusion for hgb <7.0 -monitor closely  3.  Severe thrombocytopenia -admitted with platelets 13k. S/p 1u platelet transfusion -platelets improving although remains low 28k today -Recommend transfusion for platelets <20k or <50k with active bleeding or for  procedures. -monitor closely  4. Alcohol use disorder -discussed with patient that alcohol is causing his gi bleeding. Agrees to discontinue. -encourage cessation.  5.  Hypertension -elevated bp -antihypertensives as ordered -monitor bp closely  6. CKD -may be contributory to anemia    Past Medical History:  Diagnosis Date   Chronic musculoskeletal pain    CKD (chronic kidney disease)    follows with [facility] [name])   High cholesterol Dx 2011   Hypertension    Pancolitis (HCC)    Substance abuse (HCC)    Wears partial dentures    upper only  :  Past Surgical History:  Procedure Laterality Date   BIOPSY  07/06/2021   Procedure: BIOPSY;  Surgeon: [name], MD;  Location: [facility] ENDOSCOPY;  Service: Endoscopy;;   COLONOSCOPY  12/2014   hx polyps - Pyrtle   ESOPHAGOGASTRODUODENOSCOPY (EGD) WITH PROPOFOL N/A 07/06/2021   Dr [name]. [facility] ENDOSCOPY. findings: non-bleeding Duodenal ulcer, gastric erosions. Path: mild chronic gastritis, no H Pylori.   MULTIPLE TOOTH EXTRACTIONS    :  No Known Allergies:   Family History  Problem Relation Age of Onset   Liver disease Father    Alcohol abuse Father    Diabetes Sister    Obesity Sister    Cancer Mother        breast    Kidney disease Mother    Colon cancer Neg Hx    Esophageal cancer Neg Hx    Rectal cancer Neg Hx    Stomach cancer Neg Hx    Colon polyps Neg Hx   :   Social History   Socioeconomic  History   Marital status: Married    Spouse name: Elnita Maxwell   Number of children: 1    Years of education: 12    Highest education level: Not on file  Occupational History   Occupation: Unemployed   Tobacco Use   Smoking status: Former    Current packs/day: 0.00    Average packs/day: 0.3 packs/day for 6.0 years (1.5 ttl pk-yrs)    Types: Cigarettes    Start date: 18    Quit date: 1995    Years since quitting: 29.9   Smokeless tobacco: Never  Vaping Use   Vaping status: Never Used  Substance and  Sexual Activity   Alcohol use: Yes    Alcohol/week: 6.0 standard drinks of alcohol    Types: 6 Cans of beer per week    Comment: 6 drinks Beer /Liquor   Drug use: No   Sexual activity: Yes  Other Topics Concern   Not on file  Social History Narrative   Lives in an [place], family house.    Child goes to [employer] in [place], 30 yo F,          Social Determinants of Health   Financial Resource Strain: Low Risk  (05/15/2023)   Overall Financial Resource Strain (CARDIA)    Difficulty of Paying Living Expenses: Not hard at all  Food Insecurity: No Food Insecurity (06/06/2023)   Hunger Vital Sign    Worried About Running Out of Food in the Last Year: Never true    Ran Out of Food in the Last Year: Never true  Transportation Needs: No Transportation Needs (06/06/2023)   PRAPARE - Administrator, Civil Service (Medical): No    Lack of Transportation (Non-Medical): No  Physical Activity: Sufficiently Active (05/15/2023)   Exercise Vital Sign    Days of Exercise per Week: 5 days    Minutes of Exercise per Session: 30 min  Stress: No Stress Concern Present (05/15/2023)   [employer] of Occupational Health - Occupational Stress Questionnaire    Feeling of Stress : Not at all  Social Connections: Moderately Isolated (05/15/2023)   Social Connection and Isolation Panel [NHANES]    Frequency of Communication with Friends and Family: Once a week    Frequency of Social Gatherings with Friends and Family: Once a week    Attends Religious Services: Never    Database administrator or Organizations: Yes    Attends Banker Meetings: Never    Marital Status: Married  Catering manager Violence: Not At Risk (06/06/2023)   Humiliation, Afraid, Rape, and Kick questionnaire    Fear of Current or Ex-Partner: No    Emotionally Abused: No    Physically Abused: No    Sexually Abused: No  :   CURRENT MEDS: Current Facility-Administered Medications  Medication Dose Route  Frequency Provider Last Rate Last Admin   0.9 %  sodium chloride infusion (Manually program via Guardrails IV Fluids)   Intravenous Once Anthoney Harada, NP       acetaminophen (TYLENOL) tablet 650 mg  650 mg Oral Q6H PRN Kirby Crigler, Mir M, MD   650 mg at 06/07/23 9604   Or   acetaminophen (TYLENOL) suppository 650 mg  650 mg Rectal Q6H PRN Kirby Crigler, Mir M, MD       albuterol (PROVENTIL) (2.5 MG/3ML) 0.083% nebulizer solution 2.5 mg  2.5 mg Nebulization Q2H PRN Kirby Crigler, Mir M, MD       Chlorhexidine Gluconate Cloth 2 % PADS 6 each  6 each Topical Q0600 Lorin Glass, MD   6 each at 06/06/23 1930   melatonin tablet 5 mg  5 mg Oral QHS PRN Anthoney Harada, NP   5 mg at 06/06/23 2348   ondansetron (ZOFRAN) tablet 4 mg  4 mg Oral Q6H PRN Maryln Gottron, MD   4 mg at 06/06/23 2348   Or   ondansetron (ZOFRAN) injection 4 mg  4 mg Intravenous Q6H PRN Maryln Gottron, MD       Oral care mouth rinse  15 mL Mouth Rinse 4 times per day Lorin Glass, MD   15 mL at 06/07/23 0750   Oral care mouth rinse  15 mL Mouth Rinse PRN Lorin Glass, MD       Oral care mouth rinse  15 mL Mouth Rinse PRN Kirby Crigler, Mir M, MD       oxymetazoline (AFRIN) 0.05 % nasal spray 1 spray  1 spray Each Nare Q2H Rhetta Mura, MD       pantoprazole (PROTONIX) injection 40 mg  40 mg Intravenous Q12H [name], Amy S, PA-C   40 mg at 06/06/23 2348   phytonadione (VITAMIN K) 10 mg in dextrose 5 % 50 mL IVPB  10 mg Intravenous Once Rhetta Mura, MD       sodium chloride 0.9 % bolus 1,000 mL  1,000 mL Intravenous Once Kirby Crigler, Mir M, MD        REVIEW OF SYSTEMS:   Constitutional: +fatigue, Denies fevers, chills or abnormal night sweats Eyes: Denies blurriness of vision, double vision or watery eyes Ears, nose, mouth, throat, and face: Denies mucositis or sore throat Respiratory: +sob. Denies cough, dyspnea or wheezes Cardiovascular: Denies palpitation, chest discomfort or lower extremity  swelling Gastrointestinal: Denies nausea, heartburn +melena  Skin: Denies abnormal skin rashes Lymphatics: Denies new lymphadenopathy or easy bruising Neurological: Denies numbness, tingling or new weaknesses Behavioral/Psych: Mood is stable, no new changes  All other systems were reviewed with the patient and are negative.  PHYSICAL EXAMINATION: ECOG PERFORMANCE STATUS: 2 - Symptomatic, <50% confined to bed  Vitals:   06/07/23 0842 06/07/23 0900  BP: 135/83 (!) 140/78  Pulse: 85 82  Resp: 19 15  Temp: 98.5 F (36.9 C)   SpO2: 98% 96%   Filed Weights   06/06/23 2001  Weight: 188 lb 0.8 oz (85.3 kg)    GENERAL: alert, no distress and comfortable SKIN: +pale, skin color, texture, turgor are normal, no rashes or significant lesions EYES: normal, conjunctiva are pink and non-injected, sclera clear OROPHARYNX: no exudate, no erythema and lips, buccal mucosa, and tongue normal  NECK: supple, thyroid normal size, non-tender, without nodularity LYMPH: no palpable lymphadenopathy in the cervical, axillary or inguinal LUNGS: clear to auscultation and percussion with normal breathing effort HEART: regular rate & rhythm and no murmurs and no lower extremity edema ABDOMEN: abdomen soft, non-tender and normal bowel sounds MUSCULOSKELETAL: no cyanosis of digits and no clubbing  PSYCH: alert & oriented x 3 with fluent speech NEURO: no focal motor/sensory deficits   LABS: Lab Results  Component Value Date   WBC 15.3 (H) 06/07/2023   HGB 6.8 (LL) 06/07/2023   HCT 20.4 (L) 06/07/2023   MCV 82.9 06/07/2023   PLT 28 (LL) 06/07/2023    Lab Results  Component Value Date   WBC 15.3 (H) 06/07/2023   HGB 6.8 (LL) 06/07/2023   HCT 20.4 (L) 06/07/2023   PLT 28 (LL) 06/07/2023   GLUCOSE 119 (H) 06/07/2023  CHOL 293 (H) 04/12/2020   TRIG 82 05/22/2022   HDL 95 04/12/2020   LDLCALC 181 (H) 04/12/2020   ALT 38 06/06/2023   AST 95 (H) 06/06/2023   NA 140 06/07/2023   K 3.7 06/07/2023    CL 111 06/07/2023   CREATININE 0.94 06/07/2023   BUN 14 06/07/2023   CO2 19 (L) 06/07/2023   INR 1.4 (H) 06/06/2023   HGBA1C 5.9 (H) 07/06/2021    CT ANGIO GI BLEED  Result Date: 06/06/2023 CLINICAL DATA:  Suspect GI bleed EXAM: CTA ABDOMEN AND PELVIS WITHOUT AND WITH CONTRAST TECHNIQUE: Multidetector CT imaging of the abdomen and pelvis was performed using the standard protocol during bolus administration of intravenous contrast. Multiplanar reconstructed images and MIPs were obtained and reviewed to evaluate the vascular anatomy. RADIATION DOSE REDUCTION: This exam was performed according to the departmental dose-optimization program which includes automated exposure control, adjustment of the mA and/or kV according to patient size and/or use of iterative reconstruction technique. CONTRAST:  OMNIPAQUE IOHEXOL 350 MG/ML SOLN COMPARISON:  12/12/2022 FINDINGS: VASCULAR Normal contour and caliber of the abdominal aorta. Moderate mixed calcific atherosclerosis no evidence of aneurysm, dissection, or other acute aortic pathology. Duplicated left, solitary right renal arteries with otherwise standard branching pattern of the abdominal aorta. Review of the MIP images confirms the above findings. NON-VASCULAR Lower Chest: Heterogeneous centrilobular nodular and ground-glass airspace opacity in the right-greater-than-left lung bases (series 4, image 2). Hepatobiliary: No solid liver abnormality is seen. Hepatic steatosis. No gallstones, gallbladder wall thickening, or biliary dilatation. Pancreas: Unremarkable. No pancreatic ductal dilatation or surrounding inflammatory changes. Spleen: Normal in size without significant abnormality. Adrenals/Urinary Tract: Adrenal glands are unremarkable. Unchanged perinephric fat stranding. Benign hemorrhagic or proteinaceous cyst of the lateral midportion of the left kidney, for which no further follow-up or characterization is required. Kidneys are otherwise normal,  without renal calculi, solid lesion, or hydronephrosis. Bladder is unremarkable. Stomach/Bowel: Stomach is within normal limits. Appendix appears normal. No evidence of bowel wall thickening, distention, or inflammatory changes. Small focus of endoluminal arterial contrast blush in the cecal base (series 6, image 159) Lymphatic: No enlarged abdominal or pelvic lymph nodes. Reproductive: No mass or other significant abnormality. Other: Small, fat containing bilateral inguinal hernias. No ascites. Musculoskeletal: No acute osseous findings. IMPRESSION: 1. Small focus of endoluminal arterial contrast blush in the cecal base, presumed nidus of GI bleeding. No obvious anatomic abnormality visible in this vicinity, possibly this reflects a small vascular malformation or ulceration. 2. Heterogeneous centrilobular nodular and ground-glass airspace opacity in the right-greater-than-left lung bases, consistent with infection or aspiration. 3. Hepatic steatosis. Aortic Atherosclerosis (ICD10-I70.0). Electronically Signed   By: Jearld Lesch M.D.   On: 06/06/2023 15:28   DG Abd Acute W/Chest  Result Date: 06/06/2023 CLINICAL DATA:  Concern for GI bleed. EXAM: DG ABDOMEN ACUTE WITH 1 VIEW CHEST COMPARISON:  Chest radiograph dated May 27, 2022. FINDINGS: There is no evidence of dilated bowel loops or free intraperitoneal air. No radiopaque calculi or other significant radiographic abnormality is seen. Overlapping telemetry wires. Heart size and mediastinal contours are within normal limits. Hazy opacities at the right lung base. No pleural effusion or pneumothorax. IMPRESSION: 1. Negative abdominal radiographs. Nonobstructive bowel-gas pattern. 2. Hazy opacities at the right lung base may be secondary to an infectious/inflammatory etiology. Electronically Signed   By: Hart Robinsons M.D.   On: 06/06/2023 14:06     The total time spent in the appointment was 40 minutes encounter with patients including  review of  chart and various tests results, discussions about plan of care and coordination of care plan   All questions were answered. The patient knows to call the clinic with any problems, questions or concerns. No barriers to learning was detected.  Thank you for the courtesy of this consultation, Dawson Bills, NP  12/5/20249:15 AM  Attending Note  I personally saw the patient, reviewed the chart and examined the patient. The plan of care was discussed with the patient and the admitting team. I agree with the assessment and plan as documented above. Thank you very much for the consultation.  Severe anemia: Acute GI bleed made worse by severe thrombocytopenia.  Status post blood and platelet transfusions.  Workup for the anemia was negative for autoimmune hemolysis or iron deficiency.  Agree with supportive care and transfusions.  I would like to recheck the iron studies since that does not appear to be accurate Liver dysfunction: Secondary to alcoholism.  Coagulation studies are abnormal along with elevated bilirubin.  CT scan of the chest showed fatty liver changes.  He may have splenomegaly as well. Thrombocytopenia: Possibly multifactorial secondary to liver dysfunction, consumption (immature platelet fraction is 8.4% suggesting bone marrow recovery) unlikely to be ITP.  Platelet counts are stable.  Fibrinogen of 217 makes DIC less likely.  Receiving vitamin K and thiamine We will follow along.

## 2023-06-07 NOTE — Progress Notes (Signed)
Patient ID: Stephen Bowers, male   DOB: [birth year], 62 y.o.   MRN: [id number] Pt reportedly had bright red bloody bowel movement this am with clot formation; currently denies fever,HA, CP,dyspnea, abd/back,N/V. BP 119/61  HR 91. Latest hgb 7.3(6.8), plts 28k. Findings d/w Dr. Elby Showers. Will cont to monitor for persistent bloody BM's, labs for now; pt should remain NPO except meds . Should hemodynamics change/bleeding persists or hgb drop despite transfusion would proceed with angio.

## 2023-06-08 ENCOUNTER — Telehealth: Payer: Self-pay

## 2023-06-08 ENCOUNTER — Other Ambulatory Visit: Payer: Self-pay

## 2023-06-08 DIAGNOSIS — Z860101 Personal history of adenomatous and serrated colon polyps: Secondary | ICD-10-CM | POA: Diagnosis not present

## 2023-06-08 DIAGNOSIS — F10939 Alcohol use, unspecified with withdrawal, unspecified: Secondary | ICD-10-CM | POA: Diagnosis not present

## 2023-06-08 DIAGNOSIS — K922 Gastrointestinal hemorrhage, unspecified: Secondary | ICD-10-CM | POA: Diagnosis not present

## 2023-06-08 DIAGNOSIS — D62 Acute posthemorrhagic anemia: Secondary | ICD-10-CM | POA: Diagnosis not present

## 2023-06-08 DIAGNOSIS — K852 Alcohol induced acute pancreatitis without necrosis or infection: Secondary | ICD-10-CM

## 2023-06-08 LAB — CBC WITH DIFFERENTIAL/PLATELET
Abs Immature Granulocytes: 0.08 10*3/uL — ABNORMAL HIGH (ref 0.00–0.07)
Basophils Absolute: 0 10*3/uL (ref 0.0–0.1)
Basophils Relative: 0 %
Eosinophils Absolute: 0 10*3/uL (ref 0.0–0.5)
Eosinophils Relative: 0 %
HCT: 14.9 % — ABNORMAL LOW (ref 39.0–52.0)
Hemoglobin: 4.6 g/dL — CL (ref 13.0–17.0)
Immature Granulocytes: 1 %
Lymphocytes Relative: 6 %
Lymphs Abs: 0.7 10*3/uL (ref 0.7–4.0)
MCH: 24.9 pg — ABNORMAL LOW (ref 26.0–34.0)
MCHC: 30.9 g/dL (ref 30.0–36.0)
MCV: 80.5 fL (ref 80.0–100.0)
Monocytes Absolute: 0.7 10*3/uL (ref 0.1–1.0)
Monocytes Relative: 6 %
Neutro Abs: 10.4 10*3/uL — ABNORMAL HIGH (ref 1.7–7.7)
Neutrophils Relative %: 87 %
Platelets: 13 10*3/uL — CL (ref 150–400)
RBC: 1.85 MIL/uL — ABNORMAL LOW (ref 4.22–5.81)
RDW: 21.6 % — ABNORMAL HIGH (ref 11.5–15.5)
WBC: 11.8 10*3/uL — ABNORMAL HIGH (ref 4.0–10.5)
nRBC: 0.3 % — ABNORMAL HIGH (ref 0.0–0.2)

## 2023-06-08 LAB — CBC
HCT: 25.9 % — ABNORMAL LOW (ref 39.0–52.0)
Hemoglobin: 9 g/dL — ABNORMAL LOW (ref 13.0–17.0)
MCH: 28.7 pg (ref 26.0–34.0)
MCHC: 34.7 g/dL (ref 30.0–36.0)
MCV: 82.5 fL (ref 80.0–100.0)
Platelets: 30 10*3/uL — ABNORMAL LOW (ref 150–400)
RBC: 3.14 MIL/uL — ABNORMAL LOW (ref 4.22–5.81)
RDW: 17.5 % — ABNORMAL HIGH (ref 11.5–15.5)
WBC: 10.1 10*3/uL (ref 4.0–10.5)
nRBC: 0.3 % — ABNORMAL HIGH (ref 0.0–0.2)

## 2023-06-08 LAB — IRON AND TIBC
Iron: 324 ug/dL — ABNORMAL HIGH (ref 45–182)
Saturation Ratios: 77 % — ABNORMAL HIGH (ref 17.9–39.5)
TIBC: 419 ug/dL (ref 250–450)
UIBC: 95 ug/dL

## 2023-06-08 LAB — COMPREHENSIVE METABOLIC PANEL
ALT: 40 U/L (ref 0–44)
AST: 115 U/L — ABNORMAL HIGH (ref 15–41)
Albumin: 3.3 g/dL — ABNORMAL LOW (ref 3.5–5.0)
Alkaline Phosphatase: 26 U/L — ABNORMAL LOW (ref 38–126)
Anion gap: 11 (ref 5–15)
BUN: 16 mg/dL (ref 6–20)
CO2: 21 mmol/L — ABNORMAL LOW (ref 22–32)
Calcium: 8.2 mg/dL — ABNORMAL LOW (ref 8.9–10.3)
Chloride: 102 mmol/L (ref 98–111)
Creatinine, Ser: 0.94 mg/dL (ref 0.61–1.24)
GFR, Estimated: >60 mL/min (ref >60)
Glucose, Bld: 198 mg/dL — ABNORMAL HIGH (ref 70–99)
Potassium: 3.9 mmol/L (ref 3.5–5.1)
Sodium: 134 mmol/L — ABNORMAL LOW (ref 135–145)
Total Bilirubin: 1.9 mg/dL — ABNORMAL HIGH (ref <1.2)
Total Protein: 6.7 g/dL (ref 6.5–8.1)

## 2023-06-08 LAB — PROTIME-INR
INR: 1.3 — ABNORMAL HIGH (ref 0.8–1.2)
Prothrombin Time: 16.2 s — ABNORMAL HIGH (ref 11.4–15.2)

## 2023-06-08 LAB — FERRITIN: Ferritin: 184 ng/mL (ref 24–336)

## 2023-06-08 LAB — HEMOGLOBIN AND HEMATOCRIT, BLOOD
HCT: 28.7 % — ABNORMAL LOW (ref 39.0–52.0)
Hemoglobin: 9.4 g/dL — ABNORMAL LOW (ref 13.0–17.0)

## 2023-06-08 LAB — HCV AB W REFLEX TO QUANT PCR: HCV Ab: NONREACTIVE

## 2023-06-08 LAB — HCV INTERPRETATION

## 2023-06-08 MED ORDER — CHLORDIAZEPOXIDE HCL 25 MG PO CAPS
50.0000 mg | ORAL_CAPSULE | Freq: Once | ORAL | Status: DC
  Filled 2023-06-08: qty 2

## 2023-06-08 MED ORDER — DEXMEDETOMIDINE HCL IN NACL 400 MCG/100ML IV SOLN
0.2000 ug/kg/h | INTRAVENOUS | Status: DC
  Administered 2023-06-08: 0.2 ug/kg/h via INTRAVENOUS
  Administered 2023-06-09: 0.4 ug/kg/h via INTRAVENOUS
  Filled 2023-06-08 (×2): qty 100

## 2023-06-08 MED ORDER — PHENOBARBITAL SODIUM 130 MG/ML IJ SOLN
130.0000 mg | Freq: Once | INTRAMUSCULAR | Status: AC
  Administered 2023-06-08: 130 mg via INTRAVENOUS
  Filled 2023-06-08: qty 1

## 2023-06-08 MED ORDER — LORAZEPAM 2 MG/ML IJ SOLN
1.0000 mg | INTRAMUSCULAR | Status: DC | PRN
  Administered 2023-06-08: 2 mg via INTRAVENOUS
  Filled 2023-06-08: qty 1

## 2023-06-08 NOTE — Telephone Encounter (Signed)
-----   Message from Doree Albee sent at 06/08/2023  1:53 PM EST ----- Regarding: Please set up a hospital follow up! Thanks! Patient of Dr. [name], Patient with recurrent GI bleed, CTA + cecal, unable to do IR secondary to severe thrombocytopenia, potentially ETOH versus ITP, hematology involved.  Needs hospital follow up with me or Dr. [name], get repeat CBC, CMET, INR and schedule for colonoscopy if everything is stable.  Can be over next 2 months. Thanks!

## 2023-06-08 NOTE — Plan of Care (Signed)
  Problem: Education: Goal: Knowledge of General Education information will improve Description: Including pain rating scale, medication(s)/side effects and non-pharmacologic comfort measures Outcome: Progressing   Problem: Health Behavior/Discharge Planning: Goal: Ability to manage health-related needs will improve Outcome: Progressing   Problem: Clinical Measurements: Goal: Will remain free from infection Outcome: Progressing Goal: Diagnostic test results will improve Outcome: Progressing Goal: Respiratory complications will improve Outcome: Progressing Goal: Cardiovascular complication will be avoided Outcome: Progressing   Problem: Activity: Goal: Risk for activity intolerance will decrease Outcome: Progressing   Problem: Nutrition: Goal: Adequate nutrition will be maintained Outcome: Progressing   Problem: Coping: Goal: Level of anxiety will decrease Outcome: Progressing   Problem: Elimination: Goal: Will not experience complications related to bowel motility Outcome: Progressing Goal: Will not experience complications related to urinary retention Outcome: Progressing   Problem: Pain Management: Goal: General experience of comfort will improve Outcome: Progressing   Problem: Safety: Goal: Ability to remain free from injury will improve Outcome: Progressing   Problem: Skin Integrity: Goal: Risk for impaired skin integrity will decrease Outcome: Progressing   Problem: Clinical Measurements: Goal: Ability to maintain clinical measurements within normal limits will improve Outcome: Not Progressing

## 2023-06-08 NOTE — Telephone Encounter (Signed)
Lab orders are in epic. OV scheduled with Selena Lesser PA 06/18/23 at 11:30am. Colon to be scheduled if labs ok.

## 2023-06-08 NOTE — Progress Notes (Addendum)
Stephen Bowers   DOB:12/27/1960   OZ#:[id number]      ASSESSMENT & PLAN:  Recurrent GI bleed  -history of previous GI bleeds -CT angio done 06/06/23; small focus of endoluminal arterial contrast blush in cecal base likely source of bleed.  -Followed by IR for possible embolization; however as patient is stable, there are no plans for this at this time.  -Labs done negative for HCV, HIV, DAT.-GI following   2.  Anemia -Hemoglobin improving status post transfusions.   -Hgb today 9.0; no indication to transfuse at this time -Iron studies done:  Ferritin wnl at 184; sat 77%.  No further IV iron indicated at this time.  -Recommend PRBC transfusions for Hgb less than 7.0 -Recheck CBC with differential daily  3.  Thrombocytopenia -Likely alcohol induced -Platelets somewhat improved to 30K today -Transfuse platelets for counts less than 20K or less than 50K with active bleeding or prior to procedures -Monitor closely  4.  Alcohol use disorder -Encourage cessation.   -Patient has agreed to discontinue.  Code Status Full   Subjective:  Patient seen awake and alert laying supine in bed. Reports that he feels much better. Denies new melena or new acute symptoms. No acute distress noted.   Objective:  Vitals:   06/08/23 0800 06/08/23 0827  BP: (!) 121/52   Pulse: 89   Resp: 16   Temp:  99 F (37.2 C)  SpO2: 95%      Intake/Output Summary (Last 24 hours) at 06/08/2023 0950 Last data filed at 06/08/2023 0719 Gross per 24 hour  Intake 1438.63 ml  Output 1675 ml  Net -236.37 ml     REVIEW OF SYSTEMS:   Constitutional: Denies fevers, chills or abnormal night sweats Eyes: Denies blurriness of vision, double vision or watery eyes Ears, nose, mouth, throat, and face: Denies mucositis or sore throat Respiratory: Denies cough, dyspnea or wheezes Cardiovascular: Denies palpitation, chest discomfort or lower extremity swelling Gastrointestinal:  Denies nausea, heartburn or change in  bowel habits Skin: Denies abnormal skin rashes Lymphatics: Denies new lymphadenopathy or easy bruising Neurological: Denies numbness, tingling or new weaknesses Behavioral/Psych: Mood is stable, no new changes  All other systems were reviewed with the patient and are negative.  PHYSICAL EXAMINATION: ECOG PERFORMANCE STATUS: 1 - Symptomatic but completely ambulatory  Vitals:   06/08/23 0800 06/08/23 0827  BP: (!) 121/52   Pulse: 89   Resp: 16   Temp:  99 F (37.2 C)  SpO2: 95%    Filed Weights   06/06/23 2001  Weight: 188 lb 0.8 oz (85.3 kg)    GENERAL: alert, no distress and comfortable SKIN: +pale, skin color, texture, turgor are normal, no rashes or significant lesions EYES: normal, conjunctiva are pink and non-injected, sclera clear OROPHARYNX: no exudate, no erythema and lips, buccal mucosa, and tongue normal  NECK: supple, thyroid normal size, non-tender, without nodularity LYMPH: no palpable lymphadenopathy in the cervical, axillary or inguinal LUNGS: clear to auscultation and percussion with normal breathing effort HEART: regular rate & rhythm and no murmurs and no lower extremity edema ABDOMEN: abdomen soft, non-tender and normal bowel sounds MUSCULOSKELETAL: no cyanosis of digits and no clubbing  PSYCH: alert & oriented x 3 with fluent speech NEURO: no focal motor/sensory deficits   All questions were answered. The patient knows to call the clinic with any problems, questions or concerns.   The total time spent in the appointment was 25 minutes encounter with patient including review of chart and various tests  results, discussions about plan of care and coordination of care plan  Dawson Bills, NP 06/08/2023 9:50 AM    Labs Reviewed:  Lab Results  Component Value Date   WBC 10.1 06/08/2023   HGB 9.0 (L) 06/08/2023   HCT 25.9 (L) 06/08/2023   MCV 82.5 06/08/2023   PLT 30 (L) 06/08/2023   Recent Labs    12/14/22 0703 12/15/22 0353 05/22/23 1400  05/22/23 1433 06/06/23 1424 06/07/23 0258 06/08/23 0020  NA 135   < > 135   < > 137 140 134*  K 3.0*   < > 3.5   < > 3.8 3.7 3.9  CL 101   < > 104   < > 107 111 102  CO2 21*   < > 17*  --  17* 19* 21*  GLUCOSE 115*   < > 159*   < > 154* 119* 198*  BUN 6   < > 9   < > 13 14 16   CREATININE 0.80   < > 0.82   < > 0.97 0.94 0.94  CALCIUM 8.1*   < > 8.5*  --  8.0* 7.9* 8.2*  GFRNONAA >60   < > >60  --  >60 >60 >60  PROT 6.8   < > 7.8  --  6.7  --  6.7  ALBUMIN 3.2*   < > 3.7  --  3.3*  --  3.3*  AST 110*   < > 94*  --  95*  --  115*  ALT 37   < > 37  --  38  --  40  ALKPHOS 31*   < > 26*  --  24*  --  26*  BILITOT 2.0*   < > 0.3  --  1.6*  --  1.9*  BILIDIR 0.5*  --   --   --   --   --   --    < > = values in this interval not displayed.    Studies Reviewed:  CT CHEST W CONTRAST  Result Date: 06/08/2023 CLINICAL DATA:  Flu infection with pulmonary embolus suspected. Low to intermediate probability. Negative D-dimer. Metastatic non-small cell lung cancer. EXAM: CT CHEST WITH CONTRAST TECHNIQUE: Multidetector CT imaging of the chest was performed during intravenous contrast administration. RADIATION DOSE REDUCTION: This exam was performed according to the departmental dose-optimization program which includes automated exposure control, adjustment of the mA and/or kV according to patient size and/or use of iterative reconstruction technique. CONTRAST:  80mL OMNIPAQUE IOHEXOL 300 MG/ML  SOLN COMPARISON:  05/22/2022 FINDINGS: Cardiovascular: A CTA examination was not performed. Contrast opacification of the pulmonary arteries is not sufficient for evaluation of possibility of pulmonary embolus. Heart size is normal. No pericardial effusions. Normal caliber thoracic aorta. No aortic dissection. Calcification of the aorta and coronary arteries. Mediastinum/Nodes: Thyroid gland is unremarkable. Mediastinal lymph nodes are not pathologically enlarged. Esophagus is decompressed. Lungs/Pleura: Patchy  infiltrates in the lower lungs bilaterally, more prominent on the right. This may represent post treatment changes related to history of lung cancer. No dominant mass lesion identified. Areas of infiltration were more prominent on the prior study from 2023. Early developing multifocal pneumonia could also have this appearance. No pleural effusions. No pneumothorax. Upper Abdomen: Diffuse fatty infiltration of the liver. No acute abnormalities. Musculoskeletal: Mild degenerative changes in the spine. Old rib fractures. No destructive bone lesions. IMPRESSION: 1. Patchy infiltrates in the lower lungs bilaterally, possibly residual post treatment changes or developing early pneumonia. 2.  No dominant mass lesion identified. 3. No evidence of metastatic disease. 4. Aortic atherosclerosis. 5. Fatty infiltration of the liver. Electronically Signed   By: Burman Nieves M.D.   On: 06/08/2023 01:11   CT ANGIO GI BLEED  Result Date: 06/06/2023 CLINICAL DATA:  Suspect GI bleed EXAM: CTA ABDOMEN AND PELVIS WITHOUT AND WITH CONTRAST TECHNIQUE: Multidetector CT imaging of the abdomen and pelvis was performed using the standard protocol during bolus administration of intravenous contrast. Multiplanar reconstructed images and MIPs were obtained and reviewed to evaluate the vascular anatomy. RADIATION DOSE REDUCTION: This exam was performed according to the departmental dose-optimization program which includes automated exposure control, adjustment of the mA and/or kV according to patient size and/or use of iterative reconstruction technique. CONTRAST:  OMNIPAQUE IOHEXOL 350 MG/ML SOLN COMPARISON:  12/12/2022 FINDINGS: VASCULAR Normal contour and caliber of the abdominal aorta. Moderate mixed calcific atherosclerosis no evidence of aneurysm, dissection, or other acute aortic pathology. Duplicated left, solitary right renal arteries with otherwise standard branching pattern of the abdominal aorta. Review of the MIP images  confirms the above findings. NON-VASCULAR Lower Chest: Heterogeneous centrilobular nodular and ground-glass airspace opacity in the right-greater-than-left lung bases (series 4, image 2). Hepatobiliary: No solid liver abnormality is seen. Hepatic steatosis. No gallstones, gallbladder wall thickening, or biliary dilatation. Pancreas: Unremarkable. No pancreatic ductal dilatation or surrounding inflammatory changes. Spleen: Normal in size without significant abnormality. Adrenals/Urinary Tract: Adrenal glands are unremarkable. Unchanged perinephric fat stranding. Benign hemorrhagic or proteinaceous cyst of the lateral midportion of the left kidney, for which no further follow-up or characterization is required. Kidneys are otherwise normal, without renal calculi, solid lesion, or hydronephrosis. Bladder is unremarkable. Stomach/Bowel: Stomach is within normal limits. Appendix appears normal. No evidence of bowel wall thickening, distention, or inflammatory changes. Small focus of endoluminal arterial contrast blush in the cecal base (series 6, image 159) Lymphatic: No enlarged abdominal or pelvic lymph nodes. Reproductive: No mass or other significant abnormality. Other: Small, fat containing bilateral inguinal hernias. No ascites. Musculoskeletal: No acute osseous findings. IMPRESSION: 1. Small focus of endoluminal arterial contrast blush in the cecal base, presumed nidus of GI bleeding. No obvious anatomic abnormality visible in this vicinity, possibly this reflects a small vascular malformation or ulceration. 2. Heterogeneous centrilobular nodular and ground-glass airspace opacity in the right-greater-than-left lung bases, consistent with infection or aspiration. 3. Hepatic steatosis. Aortic Atherosclerosis (ICD10-I70.0). Electronically Signed   By: Jearld Lesch M.D.   On: 06/06/2023 15:28   DG Abd Acute W/Chest  Result Date: 06/06/2023 CLINICAL DATA:  Concern for GI bleed. EXAM: DG ABDOMEN ACUTE WITH 1 VIEW  CHEST COMPARISON:  Chest radiograph dated May 27, 2022. FINDINGS: There is no evidence of dilated bowel loops or free intraperitoneal air. No radiopaque calculi or other significant radiographic abnormality is seen. Overlapping telemetry wires. Heart size and mediastinal contours are within normal limits. Hazy opacities at the right lung base. No pleural effusion or pneumothorax. IMPRESSION: 1. Negative abdominal radiographs. Nonobstructive bowel-gas pattern. 2. Hazy opacities at the right lung base may be secondary to an infectious/inflammatory etiology. Electronically Signed   By: Hart Robinsons M.D.   On: 06/06/2023 14:06

## 2023-06-08 NOTE — Progress Notes (Signed)
Patient no longer compliant, using curse words, attempting to get out of bed, unable to redirect. MD called to bedside to assess patient. MD quickly to bedside. Patient now requiring restraints. Patient's wife updated.

## 2023-06-08 NOTE — Telephone Encounter (Signed)
Lab orders in epic. Pt scheduled to see Quentin Mulling PA 06/18/23 at 11:30am. Colon to be scheduled if lab results are ok.

## 2023-06-08 NOTE — TOC Initial Note (Signed)
Transition of Care [facility]) - Initial/Assessment Note    Patient Details  Name: Stephen Bowers MRN: [id number] Date of Birth: Jul 28, 1960  [facility]) CM/SW Contact:    Lanier Clam, RN Phone Number: 06/08/2023, 12:56 PM  Clinical Narrative:  Spoke to patient about d/c plans-d/c plan home;referral for Substance use resources-patient in agreement-provided on AVS. No further CM needs.                 Expected Discharge Plan: Home/Self Care Barriers to Discharge: Continued Medical Work up   Patient Goals and CMS Choice Patient states their goals for this hospitalization and ongoing recovery are:: Home CMS Medicare.gov Compare Post Acute Care list provided to:: Patient Choice offered to / list presented to : Patient [place] ownership interest in [facility].provided to:: Patient    Expected Discharge Plan and Services   Discharge Planning Services: CM Consult Post Acute Care Choice: Resumption of Svcs/PTA Provider Living arrangements for the past 2 months: Apartment                                      Prior Living Arrangements/Services Living arrangements for the past 2 months: Apartment Lives with:: Spouse Patient language and need for interpreter reviewed:: Yes Do you feel safe going back to the place where you live?: Yes      Need for Family Participation in Patient Care: Yes (Comment) Care giver support system in place?: Yes (comment)   Criminal Activity/Legal Involvement Pertinent to Current Situation/Hospitalization: No - Comment as needed  Activities of Daily Living   ADL Screening (condition at time of admission) Independently performs ADLs?: Yes (appropriate for developmental age) Is the patient deaf or have difficulty hearing?: No Does the patient have difficulty seeing, even when wearing glasses/contacts?: Yes Does the patient have difficulty concentrating, remembering, or making decisions?: No  Permission  Sought/Granted Permission sought to share information with : Case Manager Permission granted to share information with : Yes, Verbal Permission Granted  Share Information with NAME: Case Manager           Emotional Assessment Appearance:: Appears stated age            Admission diagnosis:  Thrombocytopenia (HCC) [D69.6] Acute GI bleeding [K92.2] Lower GI bleed [K92.2] Anemia, unspecified type [D64.9] Patient Active Problem List   Diagnosis Date Noted   Lower GI bleed 06/06/2023   Abnormal CT of the abdomen 06/06/2023   Alcohol use 12/13/2022   ABLA (acute blood loss anemia) 12/12/2022   Lactic acidosis 12/12/2022   Acute blood loss anemia 12/12/2022   Hematemesis with nausea 05/23/2022   Alcoholic cirrhosis of liver without ascites (HCC) 05/23/2022   Pneumonia due to COVID-19 virus 05/22/2022   Aspiration pneumonia (HCC) 05/22/2022   Alcoholic gastritis with bleeding 05/22/2022   Lactic acid acidosis 05/22/2022   Chronic liver failure (HCC) 05/22/2022   Gout 05/22/2022   Duodenal ulcer    Hematochezia    Iron deficiency anemia due to chronic blood loss    Symptomatic anemia 07/05/2021   URI (upper respiratory infection) 07/05/2021   Transaminitis 04/23/2020   Alcohol use disorder, severe, dependence (HCC) 04/23/2020   Acute gout of right elbow    Hyponatremia    Right elbow pain    Alcohol abuse    Fever    AMS (altered mental status)    Thrombocytopenia (HCC)    Seizure-like activity (HCC)  04/11/2020   Alcohol withdrawal seizure (HCC) 04/11/2020   Alcohol use disorder, mild, abuse 02/24/2019   Alcoholic fatty liver 02/24/2019   Prediabetes 02/24/2019   Normocytic anemia 02/24/2019   Acute pancreatitis 12/20/2017   Increased anion gap metabolic acidosis    Severe sepsis (HCC)    Abnormal LFTs 12/04/2017   Pancreatitis 12/04/2017   Hyperlipidemia 10/16/2016   AKI (acute kidney injury) (HCC)    HTN (hypertension) 03/02/2015   Vitamin D deficiency  01/19/2015   Low back pain potentially associated with radiculopathy 03/30/2014   PCP:  [name], MD Pharmacy:   [facility] [address], [address] [place] [address] Phone: [phone] Fax: [phone]  [id number] - [place], [place] - [address] [place] [address] Phone: [phone] Fax: [phone]  [facility] [address] [place] [address] Phone: [phone] Fax: [phone]     Social Determinants of Health (SDOH) Social History: SDOH Screenings   Food Insecurity: No Food Insecurity (06/06/2023)  Housing: Low Risk  (06/06/2023)  Transportation Needs: No Transportation Needs (06/06/2023)  Utilities: Not At Risk (06/06/2023)  Alcohol Screen: Low Risk  (05/15/2023)  Depression (PHQ2-9): Low Risk  (05/15/2023)  Financial Resource Strain: Low Risk  (05/15/2023)  Physical Activity: Sufficiently Active (05/15/2023)  Social Connections: Moderately Isolated (05/15/2023)  Stress: No Stress Concern Present (05/15/2023)  Tobacco Use: Medium Risk (06/06/2023)  Health Literacy: Adequate Health Literacy (05/15/2023)   SDOH Interventions:     Readmission Risk Interventions    06/07/2023    8:51 AM  Readmission Risk Prevention Plan  Transportation Screening Complete  PCP or Specialist Appt within 3-5 Days Complete  HRI or Home Care Consult Complete  Social Work Consult for Recovery Care Planning/Counseling Complete  Palliative Care Screening Complete  Medication Review Oceanographer) Complete

## 2023-06-08 NOTE — Progress Notes (Addendum)
Patient ID: Stephen Bowers, male   DOB: Apr 17, 1961, 62 y.o.   MRN: [id number] Pt reports no further GI bleeding since yesterday am; vitals are stable; hgb 9(7.3), plts 30 k(34); creat nl; t bili 1.9, PT 16.2/INR 1.3; CT chest yesterday revealed:  1. Patchy infiltrates in the lower lungs bilaterally, possibly residual post treatment changes or developing early pneumonia. 2. No dominant mass lesion identified. 3. No evidence of metastatic disease. 4. Aortic atherosclerosis. 5. Fatty infiltration of the liver.  No plans for visceral angio/embolization at this time as pt is hemodynamically stable; will be on stand by should pt's clinical status worsen; pt updated; further plans as per TRH/GI/hematology

## 2023-06-08 NOTE — Progress Notes (Signed)
Patient becoming more restless, anxious. Tremors more visible. Spoke with patient's wife earlier per patient's request. Wife notified RN that patient is "an alcoholic," last drink was on Tuesday night and stated that he would "start causing problems soon." Patient compliant with care, with more symptoms noted, CIWA perform with score of 18. Ativan given per order. Patient remains cooperative.

## 2023-06-08 NOTE — Progress Notes (Addendum)
Progress Note  Primary GI: Dr. [name] DOA: 06/06/2023         Hospital Day: 3   Subjective  Chief Complaint:  Acute GI bleed, dark red blood per rectum at home x 2 large-volume, syncope   No family was present at the time of my evaluation. Patient is not had another GI bleed since yesterday morning. States overall he is feeling better and would like to be discharge by Monday to be able to get to work, works in Air traffic controller for [employer]. No abdominal pain, nausea vomiting, fever or chills.  No further nosebleeds.    Objective   Vital signs in last 24 hours: Temp:  [98.2 F (36.8 C)-99.6 F (37.6 C)] 99 F (37.2 C) (12/06 0400) Pulse Rate:  [71-108] 97 (12/06 0740) Resp:  [13-23] 16 (12/06 0740) BP: (105-149)/(47-96) 124/53 (12/06 0600) SpO2:  [96 %-100 %] 99 % (12/06 0740) Last BM Date : 06/06/23 Last BM recorded by nurses in past 5 days Stool Type: Type 6 (Mushy consistency with ragged edges) (06/07/2023 11:33 AM)  General:   male in no acute distress  Heart:  Regular rate and rhythm; no murmurs Pulm: Clear anteriorly; no wheezing Abdomen:  Soft, Obese AB, Active bowel sounds. No tenderness Extremities:  without  edema. Neurologic:  Alert and  oriented x4;  No focal deficits.  Mild tremor Psych:  Cooperative. Normal mood and affect.  Intake/Output from previous day: 12/05 0701 - 12/06 0700 In: 1628.6 [I.V.:62.2; Blood:1315.9; IV Piggyback:250.6] Out: 1795 [Urine:1795] Intake/Output this shift: Total I/O In: 100 [IV Piggyback:100] Out: -   Studies/Results: CT CHEST W CONTRAST  Result Date: 06/08/2023 CLINICAL DATA:  Flu infection with pulmonary embolus suspected. Low to intermediate probability. Negative D-dimer. Metastatic non-small cell lung cancer. EXAM: CT CHEST WITH CONTRAST TECHNIQUE: Multidetector CT imaging of the chest was performed during intravenous contrast administration. RADIATION DOSE REDUCTION: This exam was performed according to the  departmental dose-optimization program which includes automated exposure control, adjustment of the mA and/or kV according to patient size and/or use of iterative reconstruction technique. CONTRAST:  80mL OMNIPAQUE IOHEXOL 300 MG/ML  SOLN COMPARISON:  05/22/2022 FINDINGS: Cardiovascular: A CTA examination was not performed. Contrast opacification of the pulmonary arteries is not sufficient for evaluation of possibility of pulmonary embolus. Heart size is normal. No pericardial effusions. Normal caliber thoracic aorta. No aortic dissection. Calcification of the aorta and coronary arteries. Mediastinum/Nodes: Thyroid gland is unremarkable. Mediastinal lymph nodes are not pathologically enlarged. Esophagus is decompressed. Lungs/Pleura: Patchy infiltrates in the lower lungs bilaterally, more prominent on the right. This may represent post treatment changes related to history of lung cancer. No dominant mass lesion identified. Areas of infiltration were more prominent on the prior study from 2023. Early developing multifocal pneumonia could also have this appearance. No pleural effusions. No pneumothorax. Upper Abdomen: Diffuse fatty infiltration of the liver. No acute abnormalities. Musculoskeletal: Mild degenerative changes in the spine. Old rib fractures. No destructive bone lesions. IMPRESSION: 1. Patchy infiltrates in the lower lungs bilaterally, possibly residual post treatment changes or developing early pneumonia. 2. No dominant mass lesion identified. 3. No evidence of metastatic disease. 4. Aortic atherosclerosis. 5. Fatty infiltration of the liver. Electronically Signed   By: Burman Nieves M.D.   On: 06/08/2023 01:11   CT ANGIO GI BLEED  Result Date: 06/06/2023 CLINICAL DATA:  Suspect GI bleed EXAM: CTA ABDOMEN AND PELVIS WITHOUT AND WITH CONTRAST TECHNIQUE: Multidetector CT imaging of the abdomen and pelvis was performed  using the standard protocol during bolus administration of intravenous contrast.  Multiplanar reconstructed images and MIPs were obtained and reviewed to evaluate the vascular anatomy. RADIATION DOSE REDUCTION: This exam was performed according to the departmental dose-optimization program which includes automated exposure control, adjustment of the mA and/or kV according to patient size and/or use of iterative reconstruction technique. CONTRAST:  OMNIPAQUE IOHEXOL 350 MG/ML SOLN COMPARISON:  12/12/2022 FINDINGS: VASCULAR Normal contour and caliber of the abdominal aorta. Moderate mixed calcific atherosclerosis no evidence of aneurysm, dissection, or other acute aortic pathology. Duplicated left, solitary right renal arteries with otherwise standard branching pattern of the abdominal aorta. Review of the MIP images confirms the above findings. NON-VASCULAR Lower Chest: Heterogeneous centrilobular nodular and ground-glass airspace opacity in the right-greater-than-left lung bases (series 4, image 2). Hepatobiliary: No solid liver abnormality is seen. Hepatic steatosis. No gallstones, gallbladder wall thickening, or biliary dilatation. Pancreas: Unremarkable. No pancreatic ductal dilatation or surrounding inflammatory changes. Spleen: Normal in size without significant abnormality. Adrenals/Urinary Tract: Adrenal glands are unremarkable. Unchanged perinephric fat stranding. Benign hemorrhagic or proteinaceous cyst of the lateral midportion of the left kidney, for which no further follow-up or characterization is required. Kidneys are otherwise normal, without renal calculi, solid lesion, or hydronephrosis. Bladder is unremarkable. Stomach/Bowel: Stomach is within normal limits. Appendix appears normal. No evidence of bowel wall thickening, distention, or inflammatory changes. Small focus of endoluminal arterial contrast blush in the cecal base (series 6, image 159) Lymphatic: No enlarged abdominal or pelvic lymph nodes. Reproductive: No mass or other significant abnormality. Other: Small, fat  containing bilateral inguinal hernias. No ascites. Musculoskeletal: No acute osseous findings. IMPRESSION: 1. Small focus of endoluminal arterial contrast blush in the cecal base, presumed nidus of GI bleeding. No obvious anatomic abnormality visible in this vicinity, possibly this reflects a small vascular malformation or ulceration. 2. Heterogeneous centrilobular nodular and ground-glass airspace opacity in the right-greater-than-left lung bases, consistent with infection or aspiration. 3. Hepatic steatosis. Aortic Atherosclerosis (ICD10-I70.0). Electronically Signed   By: Jearld Lesch M.D.   On: 06/06/2023 15:28   DG Abd Acute W/Chest  Result Date: 06/06/2023 CLINICAL DATA:  Concern for GI bleed. EXAM: DG ABDOMEN ACUTE WITH 1 VIEW CHEST COMPARISON:  Chest radiograph dated May 27, 2022. FINDINGS: There is no evidence of dilated bowel loops or free intraperitoneal air. No radiopaque calculi or other significant radiographic abnormality is seen. Overlapping telemetry wires. Heart size and mediastinal contours are within normal limits. Hazy opacities at the right lung base. No pleural effusion or pneumothorax. IMPRESSION: 1. Negative abdominal radiographs. Nonobstructive bowel-gas pattern. 2. Hazy opacities at the right lung base may be secondary to an infectious/inflammatory etiology. Electronically Signed   By: Hart Robinsons M.D.   On: 06/06/2023 14:06    Lab Results: Recent Labs    06/07/23 0258 06/07/23 1047 06/07/23 1333 06/07/23 1545 06/08/23 0020  WBC 15.3*  --  13.8*  --  10.1  HGB 6.8*   < > 7.2* 7.3* 9.0*  HCT 20.4*   < > 20.9* 21.4* 25.9*  PLT 28*  --  34*  --  30*   < > = values in this interval not displayed.   BMET Recent Labs    06/06/23 1424 06/07/23 0258 06/08/23 0020  NA 137 140 134*  K 3.8 3.7 3.9  CL 107 111 102  CO2 17* 19* 21*  GLUCOSE 154* 119* 198*  BUN 13 14 16   CREATININE 0.97 0.94 0.94  CALCIUM 8.0* 7.9* 8.2*  LFT Recent Labs    06/08/23 0020   PROT 6.7  ALBUMIN 3.3*  AST 115*  ALT 40  ALKPHOS 26*  BILITOT 1.9*   PT/INR Recent Labs    06/06/23 1857 06/08/23 0020  LABPROT 17.1* 16.2*  INR 1.4* 1.3*     Scheduled Meds:  sodium chloride   Intravenous Once   Chlorhexidine Gluconate Cloth  6 each Topical Q0600   folic acid  1 mg Oral Daily   methylPREDNISolone (SOLU-MEDROL) injection  125 mg Intravenous Daily   multivitamin with minerals  1 tablet Oral Daily   pantoprazole (PROTONIX) IV  40 mg Intravenous Q12H   thiamine  100 mg Oral Daily   Or   thiamine  100 mg Intravenous Daily   Continuous Infusions:  ampicillin-sulbactam (UNASYN) IV Stopped (06/08/23 0700)   sodium chloride        Patient profile:   62 year old AA male history of alcohol abuse, acute lower GI bleed 2 large-volume dark red bloody bowel movements, Hgb 4.6 on arrival, CTA positive cecal base   Impression/Plan:   Acute lower GI bleed 06/08/2023  HGB 9.0 MCV 82.5 Platelets 30 06/06/2023 Iron 343 Ferritin 30 B12 1,222 Recent Labs    05/15/23 1440 05/22/23 1400 05/22/23 1433 06/06/23 1424 06/06/23 1617 06/07/23 0258 06/07/23 1047 06/07/23 1333 06/07/23 1545 06/08/23 0020  HGB 5.6* 5.2* 6.8* 4.6* 4.6* 6.8* 7.3* 7.2* 7.3* 9.0*   12/10/2014 colonoscopy for screening purposes good prep with Suprep showed sessile polyp ileocecal valve, 3 polyps 3 to 5 mm cecum, 3 sessile polyps 4 to 6 mm sigmoid colon 4 polyps were tubular adenomatous polyps without dysplasia, 3 were hyperplastic.  03/07/2018 colonoscopy for surveillance showed 3 polyps 2 to 5 mm rectum sigmoid transverse colon, small internal hemorrhoids All 3 polyps were hyperplastic recall 5 years, 03/2024 Previous GI bleed June 2024 CT negative no endoscopic evaluation Hgb 4.6 status post 4 units PRBC Hgb 6.8 s/p 1 unit PRBC currently at 9 Platelets s/p 2 units went from 13-->30 -12/4 iron 343, saturations 85, ferritin 30 pending repeat iron and ferritin --Continue to monitor H&H with  transfusion as needed to maintain hemoglobin greater than 7. -IR with embolization for positive CTA when patient optimized and if patient continues to have bleeding -Patient with recurrent GI bleed, CTA positive with arterial blush in cecal base, most likely this still represents AVM versus mucosal ulceration, very low risk for malignancy with previous unremarkable colonoscopy 2019 with only hyperplastic polyps patient high risk for endoscopic evaluation right now with low platelets. -Will plan for outpatient follow-up with our office to discuss repeat colonoscopy outpatient  Profound thrombocytopenia Platelets s/p 2 units went from 13-->30 -Hematology seen the patient negative DIC panel, unremarkable HIV, hepatitis C, normal immature platelet fraction  thought less likely to be ITP possibly multifactorial from liver dysfunction/alcohol - pending ADAMTS13 -Given dose of Solu-Medrol 125 yesterday -Continue to transfuse platelets as needed to goal of 50  Elevated liver functions in setting of alcohol use AST greater than ALT, likely alcohol driven with fatty liver CTA this admission and previous abdominal CTs reviewed and shows advanced hepatic steatosis, CT on 05/2022 mentions mildly nodular hepatic contour suggestive of early cirrhosis. In all of the abdominal CTs no evidence of splenomegaly or portal hypertension Most likely this represents alcoholic hepatitis with advanced fatty liver versus potentially early cirrhosis.  Patient does have slight increased INR however this could be nutritional/infection/acute, received vitamin K 10 mg yesterday INR 1.4-->1.3 Would suggest complete cessation of  alcohol and continue monitoring of LFTs with possible elastography outpatient after alcohol cessation. Can consider hepatocellular workup outpatient  History of alcohol abuse Counseled on ETOH cessation On thiamine CIWA protocol  History of duodenal ulcer January 2023 1 EGD Continue Protonix IV 40  mg twice daily  Principal Problem:   Lower GI bleed Active Problems:   Abnormal CT of the abdomen    LOS: 2 days   Doree Albee  06/08/2023, 8:12 AM  GI ATTENDING  Interval history data reviewed.  Patient seen and examined as outlined above.  Agree with interval progress note.  The patient is clinically stable.  The patient presented with acute bleeding secondary to a vascular cecal lesion.  This in the face of profound thrombocytopenia and coagulopathy.  This most likely represents an AVM.  Could be ulcerative lesion as mentioned previously.  Much less likely to be cancer (unusual presentation and 2 colonoscopies within the past 8 years as outlined).  Without active bleeding, colonoscopy would be a good next step toward evaluation and possible treatment.  If he were to have an AVM, for example, this could be treated endoscopically with ablative therapy.  HOWEVER, he is not a candidate at this time with ongoing significant thrombocytopenia and mild coagulopathy.  We appreciate oncology's help with this issue.  Continue with close monitoring and supportive measures as you have been doing.  We will follow along.  Thanks  Wilhemina Bonito. Eda Keys., M.D. [facility] Division of Gastroenterology.

## 2023-06-08 NOTE — Care Plan (Signed)
Stephen Bowers expressed concern and some reasonable aggravation regarding his care goals for today. He is concerned that there is no scheduled procedure while he is NPO. He states that he needs to get back to work "to make some money for my wife and kids". He did mention that he would like to leave AMA if he doesn't get answers, though he would stay if he had reason.  We had a discussion about why he is NPO, and he said he felt reassured after my assurance that I would pass on his concerns to the daytime care team and request that he be informed of the plan.  There was no escalation. Mr. Mclarney was calmly irritated.

## 2023-06-08 NOTE — Progress Notes (Signed)
HOSPITALIST ROUNDING NOTE ANKIT KOPECKY ZOX:[id number]  DOB: 04/23/1961  DOA: 06/06/2023  PCP: [name], MD  06/08/2023,9:37 AM   LOS: 2 days      Code Status: Full code From: Home  current Dispo: Likely home     62 year old black male Previous EtOH complicated by DTs, severe thrombocytopenia HTN HLD-prior duodenal ulcer without active bleed gastric erosions status post EGD 1/23 admission-previous colonoscopy 2019 2-5 mm polyps rectum, transverse small internal hemorrhoids Still drinks about 5 drinks a day not daily Came to ED initially 05/22/2023 fatigue lightheaded no active bleed transfused 2 units sent home platelets 275 Return to ED 06/06/2023 large rectal bleed syncopal episode last EtOH 12/3 CT angio = small focus endoluminal arterial contrast blush cecal base hemoglobin 4.6 platelet 13 INR 1.5--transfused about 3 units PRBC GI consulted Interventional radiology consulted  Large bleed again early a.m. 12/5 transfused 1 more unit platelets this morning and then 1 more unit of blood this afternoon 12/5 oncology consulted, ENT consulted, CT angiogram chest shows pneumonia  Plan  Hypovolemic shock on arrival secondary to large GI bleed Hemodynamics improved shock physiology improved-  Epistaxis and severe GI bleed on admission secondary to severe thrombocytopenia For epistaxis can continue Afrin every 3 as needed but liberalized to every 6 Status post several transfusions this hospital stay of PRBC Received platelets X2 in addition to VIt K No urgent role for IR angio Await GI input re: diet, >? Need colonoscopy non-emergently this admit  Sepsis on admission 2/2 early developing pneumonia Unasyn started 12/5 Monitor trends of the labs etc. overnight and likely de-escalate to Augmentin 24 to 48 hours  ITP vs consumption/Liver dyscrasia ? Cirrhosis vs splenomegaly--not felt by Heme y o be DIC/ ITP--stop steroids.  Supportive management thiamine 100 daily, hold vitamin K  for now, folic acid 1 mg started Await DAT ,ADAMST13, HCV HIV  Coagulopathy 2/2 EtOH No heavy withdrawal Stop protocol-needs to quit drinking  Impaired glu tolerance Get A1c Start SSI if continues to have elevated cbg  Metabolic acidosis on admission lactic acidosis secondary to probable sepsis Gap is improved  DVT prophylaxis: SCD  Status is: Inpatient Remains inpatient appropriate because:   Remains relatively sick    Subjective:  Want to go home No bleed no fever chills no nasal bleed  Objective + exam Vitals:   06/08/23 0719 06/08/23 0740 06/08/23 0800 06/08/23 0827  BP:   (!) 121/52   Pulse: (!) 108 97 89   Resp: 18 16 16    Temp:    99 F (37.2 C)  TempSrc:    Oral  SpO2: 96% 99% 95%   Weight:      Height:       Filed Weights   06/06/23 2001  Weight: 85.3 kg    Examination:  Eomi ncat, no nasal bleed Slight tremulousness No focal deficit Abd soft nt nd no rebound no guard Rom intact Power 5/5   Data Reviewed: reviewed   CBC    Component Value Date/Time   WBC 10.1 06/08/2023 0020   RBC 3.14 (L) 06/08/2023 0020   HGB 9.0 (L) 06/08/2023 0020   HGB 5.6 (LL) 05/15/2023 1440   HCT 25.9 (L) 06/08/2023 0020   HCT 18.2 (L) 05/15/2023 1440   PLT 30 (L) 06/08/2023 0020   PLT 121 (L) 05/15/2023 1440   MCV 82.5 06/08/2023 0020   MCV 84 05/15/2023 1440   MCH 28.7 06/08/2023 0020   MCHC 34.7 06/08/2023 0020   RDW 17.5 (  H) 06/08/2023 0020   RDW 20.7 (H) 05/15/2023 1440   LYMPHSABS 1.3 06/07/2023 1333   LYMPHSABS 1.8 05/15/2023 1440   MONOABS 0.7 06/07/2023 1333   EOSABS 0.1 06/07/2023 1333   EOSABS 0.1 05/15/2023 1440   BASOSABS 0.1 06/07/2023 1333   BASOSABS 0.2 05/15/2023 1440      Latest Ref Rng & Units 06/08/2023   12:20 AM 06/07/2023    2:58 AM 06/06/2023    2:24 PM  CMP  Glucose 70 - 99 mg/dL 952  841  324   BUN 6 - 20 mg/dL 16  14  13    Creatinine 0.61 - 1.24 mg/dL 4.01  0.27  2.53   Sodium 135 - 145 mmol/L 134  140  137   Potassium  3.5 - 5.1 mmol/L 3.9  3.7  3.8   Chloride 98 - 111 mmol/L 102  111  107   CO2 22 - 32 mmol/L 21  19  17    Calcium 8.9 - 10.3 mg/dL 8.2  7.9  8.0   Total Protein 6.5 - 8.1 g/dL 6.7   6.7   Total Bilirubin <1.2 mg/dL 1.9   1.6   Alkaline Phos 38 - 126 U/L 26   24   AST 15 - 41 U/L 115   95   ALT 0 - 44 U/L 40   38      Scheduled Meds:  sodium chloride   Intravenous Once   Chlorhexidine Gluconate Cloth  6 each Topical Q0600   folic acid  1 mg Oral Daily   methylPREDNISolone (SOLU-MEDROL) injection  125 mg Intravenous Daily   multivitamin with minerals  1 tablet Oral Daily   pantoprazole (PROTONIX) IV  40 mg Intravenous Q12H   thiamine  100 mg Oral Daily   Or   thiamine  100 mg Intravenous Daily   Continuous Infusions:  ampicillin-sulbactam (UNASYN) IV Stopped (06/08/23 0700)   sodium chloride      Time  70  Rhetta Mura, MD  [facility]

## 2023-06-09 DIAGNOSIS — F10939 Alcohol use, unspecified with withdrawal, unspecified: Secondary | ICD-10-CM | POA: Diagnosis not present

## 2023-06-09 DIAGNOSIS — K922 Gastrointestinal hemorrhage, unspecified: Secondary | ICD-10-CM | POA: Diagnosis not present

## 2023-06-09 DIAGNOSIS — D62 Acute posthemorrhagic anemia: Secondary | ICD-10-CM | POA: Diagnosis not present

## 2023-06-09 DIAGNOSIS — Z860101 Personal history of adenomatous and serrated colon polyps: Secondary | ICD-10-CM | POA: Diagnosis not present

## 2023-06-09 LAB — BPAM PLATELET PHERESIS
Blood Product Expiration Date: 202412062359
Blood Product Expiration Date: 202412072359
ISSUE DATE / TIME: 202412050820
Unit Type and Rh: 6200
Unit Type and Rh: 6200

## 2023-06-09 LAB — COMPREHENSIVE METABOLIC PANEL
ALT: 42 U/L (ref 0–44)
AST: 76 U/L — ABNORMAL HIGH (ref 15–41)
Albumin: 3.4 g/dL — ABNORMAL LOW (ref 3.5–5.0)
Alkaline Phosphatase: 27 U/L — ABNORMAL LOW (ref 38–126)
Anion gap: 9 (ref 5–15)
BUN: 15 mg/dL (ref 6–20)
CO2: 22 mmol/L (ref 22–32)
Calcium: 8.8 mg/dL — ABNORMAL LOW (ref 8.9–10.3)
Chloride: 103 mmol/L (ref 98–111)
Creatinine, Ser: 0.77 mg/dL (ref 0.61–1.24)
GFR, Estimated: >60 mL/min (ref >60)
Glucose, Bld: 163 mg/dL — ABNORMAL HIGH (ref 70–99)
Potassium: 3.4 mmol/L — ABNORMAL LOW (ref 3.5–5.1)
Sodium: 134 mmol/L — ABNORMAL LOW (ref 135–145)
Total Bilirubin: 1.2 mg/dL — ABNORMAL HIGH (ref <1.2)
Total Protein: 7.2 g/dL (ref 6.5–8.1)

## 2023-06-09 LAB — PREPARE PLATELET PHERESIS
Unit division: 0
Unit division: 0

## 2023-06-09 LAB — CBC
HCT: 31 % — ABNORMAL LOW (ref 39.0–52.0)
Hemoglobin: 10.3 g/dL — ABNORMAL LOW (ref 13.0–17.0)
MCH: 28.9 pg (ref 26.0–34.0)
MCHC: 33.2 g/dL (ref 30.0–36.0)
MCV: 87.1 fL (ref 80.0–100.0)
Platelets: 36 10*3/uL — ABNORMAL LOW (ref 150–400)
RBC: 3.56 MIL/uL — ABNORMAL LOW (ref 4.22–5.81)
RDW: 18 % — ABNORMAL HIGH (ref 11.5–15.5)
WBC: 10.1 10*3/uL (ref 4.0–10.5)
nRBC: 0.4 % — ABNORMAL HIGH (ref 0.0–0.2)

## 2023-06-09 MED ORDER — SODIUM CHLORIDE 0.9 % IV SOLN
3.0000 g | Freq: Four times a day (QID) | INTRAVENOUS | Status: DC
  Administered 2023-06-09 – 2023-06-10 (×3): 3 g via INTRAVENOUS
  Filled 2023-06-09 (×3): qty 8

## 2023-06-09 MED ORDER — HYDRALAZINE HCL 20 MG/ML IJ SOLN
5.0000 mg | Freq: Four times a day (QID) | INTRAMUSCULAR | Status: DC | PRN
  Administered 2023-06-09: 5 mg via INTRAVENOUS
  Filled 2023-06-09: qty 1

## 2023-06-09 MED ORDER — PHENOBARBITAL 32.4 MG PO TABS: 64.8000 mg | ORAL_TABLET | Freq: Three times a day (TID) | ORAL | Status: DC

## 2023-06-09 MED ORDER — SODIUM CHLORIDE 0.9 % IV SOLN
260.0000 mg | Freq: Once | INTRAVENOUS | Status: AC
  Administered 2023-06-09: 260 mg via INTRAVENOUS
  Filled 2023-06-09: qty 2

## 2023-06-09 MED ORDER — PHENOBARBITAL 32.4 MG PO TABS: 32.4000 mg | ORAL_TABLET | Freq: Three times a day (TID) | ORAL | Status: DC

## 2023-06-09 MED ORDER — PHENOBARBITAL 32.4 MG PO TABS
97.2000 mg | ORAL_TABLET | Freq: Three times a day (TID) | ORAL | Status: DC
  Administered 2023-06-09 – 2023-06-10 (×2): 97.2 mg via ORAL
  Filled 2023-06-09 (×2): qty 3

## 2023-06-09 NOTE — Progress Notes (Signed)
HOSPITALIST ROUNDING NOTE [place] Stephen Bowers:[id number]  DOB: 06/24/1961  DOA: 06/06/2023  PCP: [name], MD  06/09/2023,4:02 PM   LOS: 3 days      Code Status: Full code From: Home  current Dispo: Likely home     62 year old black male Previous EtOH complicated by DTs, severe thrombocytopenia HTN HLD-prior duodenal ulcer without active bleed gastric erosions status post EGD 1/23 admission-previous colonoscopy 2019 2-5 mm polyps rectum, transverse small internal hemorrhoids Still drinks about 5 drinks a day not daily Came to ED initially 05/22/2023 fatigue lightheaded no active bleed transfused 2 units sent home platelets 275 Return to ED 06/06/2023 large rectal bleed syncopal episode last EtOH 12/3 CT angio = small focus endoluminal arterial contrast blush cecal base hemoglobin 4.6 platelet 13 INR 1.5--transfused about 3 units PRBC GI consulted Interventional radiology consulted  Large bleed again early a.m. 12/5 transfused 1 more unit platelets this morning and then 1 more unit of blood this afternoon 12/5 oncology consulted, ENT consulted, CT angiogram chest shows pneumonia 12/6 severe withdrawals Precedex initiated 12/7 Precedex stopped on phenobarb taper more responsive  Plan  Hypovolemic shock on admission secondary to GI bleed probably diverticular Shock physiology improved-monitor on stepdown/ICU Eventual colonoscopy? Clear liquid diet as per GI Continues Protonix 40 twice daily  Severe EtOH withdrawal--drinks 1/2 pint-1 pint E&J liquor and 6 pack beer for about for a "few years" [> 15 years] Started to withdrawal 12/6 PM---meld na 3.0 only 12.0 [06/09/2023] Not responsive to multiple rounds of 3 mg IV Ativan Decision made to place on Precedex gtt. which has been weaned off today 12/7 Currently on phenobarb taper with close monitoring of CIWA- Seems stable and protecting airway-critical care aware peripherally of patient if decompensates May need restraints  renewed  Epistaxis 12/5 Resolved with Afrin-periodic rechecks-no longer an acute issue  Sepsis on arrival 2/2?  Pneumonia-Unasyn started 12/5 Unable to safely take p.o. 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  DVT prophylaxis: SCD  Status is: Inpatient Remains inpatient appropriate because:   Remains relatively sick    Subjective:  Somnolent arouses some but quite sedated from the Precedex etc. No reports of bleeding Unable to take p.o.   Objective + exam Vitals:   06/09/23 1200 06/09/23 1300 06/09/23 1400 06/09/23 1500  BP: 105/71  (!) 141/74   Pulse: 71 66 68 73  Resp: 14 15 17  (!) 9  Temp: (!) 97.2 F (36.2 C)     TempSrc: Oral     SpO2: 97% 93% 99% 98%  Weight:      Height:       Filed Weights   06/06/23 2001  Weight: 85.3 kg    Examination:  EOMI NCAT no blood in nares Chest clear S1-S2 no murmur Abdomen soft no shifting dullness no HSM Cannot appreciate or examine given somnolence his neuro or psych    Data Reviewed: reviewed   CBC    Component Value Date/Time   WBC 10.1 06/09/2023 0242   RBC 3.56 (L) 06/09/2023 0242   HGB 10.3 (L) 06/09/2023 0242   HGB  5.6 (LL) 05/15/2023 1440   HCT 31.0 (L) 06/09/2023 0242   HCT 18.2 (L) 05/15/2023 1440   PLT 36 (L) 06/09/2023 0242   PLT 121 (L) 05/15/2023 1440   MCV 87.1 06/09/2023 0242   MCV 84 05/15/2023 1440   MCH 28.9 06/09/2023 0242   MCHC 33.2 06/09/2023 0242   RDW 18.0 (H) 06/09/2023 0242   RDW 20.7 (H) 05/15/2023 1440   LYMPHSABS 1.3  06/07/2023 1333   LYMPHSABS 1.8 05/15/2023 1440   MONOABS 0.7 06/07/2023 1333   EOSABS 0.1 06/07/2023 1333   EOSABS 0.1 05/15/2023 1440   BASOSABS 0.1 06/07/2023 1333   BASOSABS 0.2 05/15/2023 1440      Latest Ref Rng & Units 06/09/2023    2:42 AM 06/08/2023   12:20 AM 06/07/2023    2:58 AM  CMP  Glucose 70 - 99 mg/dL 782  956  213   BUN 6 - 20 mg/dL 15  16  14    Creatinine 0.61 - 1.24 mg/dL 0.86  5.78  4.69   Sodium 135 - 145 mmol/L 134  134  140   Potassium 3.5 - 5.1 mmol/L 3.4  3.9  3.7   Chloride 98 - 111 mmol/L 103  102  111   CO2 22 - 32 mmol/L 22  21  19    Calcium 8.9 - 10.3 mg/dL 8.8  8.2  7.9   Total Protein 6.5 - 8.1 g/dL 7.2  6.7    Total Bilirubin <1.2 mg/dL 1.2  1.9    Alkaline Phos 38 - 126 U/L 27  26    AST 15 - 41 U/L 76  115    ALT 0 - 44 U/L 42  40       Scheduled Meds:  sodium chloride   Intravenous Once   chlordiazePOXIDE  50 mg Oral Once   Chlorhexidine Gluconate Cloth  6 each Topical Q0600   folic acid  1 mg Oral Daily   multivitamin with minerals  1 tablet Oral Daily   pantoprazole (PROTONIX) IV  40 mg Intravenous Q12H   phenobarbital  97.2 mg Oral Q8H   Followed by   [START ON 06/11/2023] phenobarbital  64.8 mg Oral Q8H   Followed by   [START ON 06/13/2023] phenobarbital  32.4 mg Oral Q8H   thiamine  100 mg Oral Daily   Or   thiamine  100 mg Intravenous Daily   Continuous Infusions:  ampicillin-sulbactam (UNASYN) IV Stopped (06/09/23 1211)   dexmedetomidine (PRECEDEX) IV infusion Stopped (06/09/23 1336)   sodium chloride      Time  70  Rhetta Mura, MD  [facility]

## 2023-06-09 NOTE — Plan of Care (Signed)
  Problem: Clinical Measurements: Goal: Will remain free from infection Outcome: Progressing Goal: Respiratory complications will improve Outcome: Progressing Goal: Cardiovascular complication will be avoided Outcome: Progressing   Problem: Skin Integrity: Goal: Risk for impaired skin integrity will decrease Outcome: Progressing

## 2023-06-09 NOTE — Progress Notes (Signed)
HISTORY OF PRESENT ILLNESS:  Stephen Bowers is a 62 y.o. male admitted with acute GI bleeding.  Positive CTA in the region of the cecum.  Did not undergo planned IR embolization due to profound thrombocytopenia and mild coagulopathy.  Supportive transfusions.  Fortunately, stop bleeding.  Yesterday had issues with confusion felt to be alcohol withdrawal.  Remains clinically stable.  Less confused today.  No complaints.  Nursing reports no bowel movements.  Laboratories: Hemoglobin 10.3 (up from 9.4) Platelets 36,000 (up from 30,000) Potassium 3.4 Sodium 134 CT of the chest June 07, 2023: No PE.  Question early pneumonia  REVIEW OF SYSTEMS:  All non-GI ROS negative. Past Medical History:  Diagnosis Date   Chronic musculoskeletal pain    CKD (chronic kidney disease)    follows with [facility] [name])   High cholesterol Dx 2011   Hypertension    Pancolitis (HCC)    Substance abuse (HCC)    Wears partial dentures    upper only    Past Surgical History:  Procedure Laterality Date   BIOPSY  07/06/2021   Procedure: BIOPSY;  Surgeon: [name], MD;  Location: [facility] ENDOSCOPY;  Service: Endoscopy;;   COLONOSCOPY  12/2014   hx polyps - Pyrtle   ESOPHAGOGASTRODUODENOSCOPY (EGD) WITH PROPOFOL N/A 07/06/2021   Dr [name]. [facility] ENDOSCOPY. findings: non-bleeding Duodenal ulcer, gastric erosions. Path: mild chronic gastritis, no H Pylori.   MULTIPLE TOOTH EXTRACTIONS      Social History Stephen Bowers  reports that he quit smoking about 29 years ago. His smoking use included cigarettes. He started smoking about 35 years ago. He has a 1.5 pack-year smoking history. He has never used smokeless tobacco. He reports current alcohol use of about 6.0 standard drinks of alcohol per week. He reports that he does not use drugs.  family history includes Alcohol abuse in his father; Cancer in his mother; Diabetes in his sister; Kidney disease in his mother; Liver disease in his father;  Obesity in his sister.  No Known Allergies     PHYSICAL EXAMINATION: Vital signs: BP 105/71 (BP Location: Right Arm)   Pulse 71   Temp (!) 97.2 F (36.2 C) (Oral)   Resp 14   Ht 5\' 11"  (1.803 m)   Wt 85.3 kg   SpO2 97%   BMI 26.23 kg/m   Constitutional: Resting in bed, fatigued appearing, no acute distress Psychiatric: Fairly alert, cooperative, asking to go home Eyes: extraocular movements intact, anicteric, conjunctiva pink Mouth: oral pharynx moist, no lesions Neck: supple no lymphadenopathy Cardiovascular: heart regular rate and rhythm, no murmur Lungs: clear to auscultation bilaterally with decreased breath sounds at the bases Abdomen: soft, nontender, mildly distended, no obvious ascites, no peritoneal signs, normal bowel sounds, no organomegaly Rectal: Omitted Extremities: no clubbing or cyanosis.  Trace lower extremity edema bilaterally Skin: no lesions on visible extremities Neuro: No focal deficits.  Cranial nerves intact  ASSESSMENT:  1.  Acute lower GI bleed secondary to undefined cecal vascular lesion.  AVM most likely.  Possibly isolated diverticulum.  Consider ulcer.  Doubt cancer. Worsening, he has no bleeding.  Hemoglobin stable.  Vital signs stable.  Still with significant thrombocytopenia. 2.  Alcohol withdrawal.  Stable   PLAN:  1.  Continue with supportive care.  Monitor for rebleeding.  Transfuse as needed. 2.  Would like to perform colonoscopy, if possible, prior to discharge if reasonable platelet count. 3.  Supportive measures for alcohol withdrawal  We will follow-up Monday morning,  but are available in the interim if needed.  Thanks  Wilhemina Bonito. Eda Keys., M.D. [facility] Division of Gastroenterology

## 2023-06-10 DIAGNOSIS — K922 Gastrointestinal hemorrhage, unspecified: Secondary | ICD-10-CM | POA: Diagnosis not present

## 2023-06-10 LAB — BPAM RBC
Blood Product Expiration Date: 202412272359
Blood Product Expiration Date: 202412272359
Blood Product Expiration Date: 202412272359
Blood Product Expiration Date: 202412272359
Blood Product Expiration Date: 202412272359
Blood Product Expiration Date: 202412282359
Blood Product Expiration Date: 202412282359
ISSUE DATE / TIME: 202412041546
ISSUE DATE / TIME: 202412041942
ISSUE DATE / TIME: 202412042332
ISSUE DATE / TIME: 202412050511
ISSUE DATE / TIME: 202412051657
Unit Type and Rh: 6200
Unit Type and Rh: 6200
Unit Type and Rh: 6200
Unit Type and Rh: 6200
Unit Type and Rh: 6200
Unit Type and Rh: 6200
Unit Type and Rh: 6200

## 2023-06-10 LAB — TYPE AND SCREEN
ABO/RH(D): A POS
Antibody Screen: NEGATIVE
Unit division: 0
Unit division: 0
Unit division: 0
Unit division: 0
Unit division: 0
Unit division: 0
Unit division: 0

## 2023-06-10 LAB — COMPREHENSIVE METABOLIC PANEL
ALT: 52 U/L — ABNORMAL HIGH (ref 0–44)
AST: 97 U/L — ABNORMAL HIGH (ref 15–41)
Albumin: 3.1 g/dL — ABNORMAL LOW (ref 3.5–5.0)
Alkaline Phosphatase: 27 U/L — ABNORMAL LOW (ref 38–126)
Anion gap: 11 (ref 5–15)
BUN: 13 mg/dL (ref 6–20)
CO2: 23 mmol/L (ref 22–32)
Calcium: 8.2 mg/dL — ABNORMAL LOW (ref 8.9–10.3)
Chloride: 97 mmol/L — ABNORMAL LOW (ref 98–111)
Creatinine, Ser: 0.87 mg/dL (ref 0.61–1.24)
GFR, Estimated: >60 mL/min (ref >60)
Glucose, Bld: 102 mg/dL — ABNORMAL HIGH (ref 70–99)
Potassium: 2.8 mmol/L — ABNORMAL LOW (ref 3.5–5.1)
Sodium: 131 mmol/L — ABNORMAL LOW (ref 135–145)
Total Bilirubin: 0.8 mg/dL (ref <1.2)
Total Protein: 6.6 g/dL (ref 6.5–8.1)

## 2023-06-10 LAB — CBC
HCT: 29.1 % — ABNORMAL LOW (ref 39.0–52.0)
Hemoglobin: 9.6 g/dL — ABNORMAL LOW (ref 13.0–17.0)
MCH: 28.4 pg (ref 26.0–34.0)
MCHC: 33 g/dL (ref 30.0–36.0)
MCV: 86.1 fL (ref 80.0–100.0)
Platelets: 47 10*3/uL — ABNORMAL LOW (ref 150–400)
RBC: 3.38 MIL/uL — ABNORMAL LOW (ref 4.22–5.81)
RDW: 18 % — ABNORMAL HIGH (ref 11.5–15.5)
WBC: 11.1 10*3/uL — ABNORMAL HIGH (ref 4.0–10.5)
nRBC: 0.6 % — ABNORMAL HIGH (ref 0.0–0.2)

## 2023-06-10 MED ORDER — POTASSIUM CHLORIDE CRYS ER 20 MEQ PO TBCR
40.0000 meq | EXTENDED_RELEASE_TABLET | Freq: Once | ORAL | Status: AC
  Administered 2023-06-10: 40 meq via ORAL
  Filled 2023-06-10: qty 2

## 2023-06-10 MED ORDER — POTASSIUM CHLORIDE 10 MEQ/100ML IV SOLN
10.0000 meq | INTRAVENOUS | Status: AC
  Administered 2023-06-10: 10 meq via INTRAVENOUS
  Filled 2023-06-10: qty 100

## 2023-06-10 NOTE — Discharge Summary (Signed)
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  Please see below for itemized issues addressed in HOpsital- refer to other progress notes for clarity if needed  Discharge Condition: Guarded  Diet recommendation: no recommendation made  [facility] Weights   06/06/23 2001  Weight: 85.3 kg    History of present illness:  62 year old black male Previous EtOH drinks half to 1 pint of ENG liquor a day sixpack of beer for over 15 to 20 years-previously complicated by DTs, severe thrombocytopenia HTN HLD- prior duodenal ulcer without active bleed gastric erosions status post EGD 1/23 admission-previous colonoscopy 2019 2-5 mm polyps rectum, transverse small internal hemorrhoids  Came to ED initially 05/22/2023 fatigue lightheaded no active bleed transfused 2 units sent home platelets 275  Return to ED 06/06/2023 large rectal bleed syncopal episode last EtOH 12/3 CT angio = small focus endoluminal arterial contrast blush cecal base hemoglobin 4.6 platelet 13 INR 1.5--transfused about 3 units PRBC GI consulted Interventional radiology consulted Conservative management initially was pursued   Large bleed again early a.m. 12/5 transfused 1 more unit platelets this morning and then 1 more unit of blood  He had minor epistaxis which was treated with Afrin on 12/5 after the phone discussion with ENT  12/5 oncology consulted, ENT consulted,  CT angiogram chest shows pneumonia 12/6 severe withdrawals Precedex initiated after failing to redirect with IV Ativan and becoming more combative etc. 12/7 Precedex stopped on phenobarb taper more responsive  Patient bleeding stopped during hospital stay his platelets rebounded up to a peak of 46 it was felt that he had probable cirrhosis causing his splenic sequestration his DAT ADAMTS13 HIV/HCV were negative and his labs were stable  He was on tapering doses of phenobarbital and was supposed to receive a phenobarb taper over the next 3 to 4 days I went to the bedside on 12/8 to talk with him and examined him and he informed me he wanted to leave I explained to him clearly that he is too sick to leave he has a pneumonia which is likely an aspiration pneumonia that is being treated he also has labs that are still somewhat abnormal and he is at high risk for bleeding He was coherent in his right mind and knew that this was the hospital and could orient completely was not tremulous his CIWA score was 1-2 He explained to me that he wanted to go "to work tomorrow" I suspect he is going home to drink I have counseled him strongly against the same He still decided to exercises independent decision after much discussion with myself and nursing to leave AGAINST MEDICAL ADVICE He had called his wife who refused to pick him up and he left the hospital in scrubs despite it being 27 F outside He is being discharged without prejudice and has been urged to come back to the emergency room if he becomes more sick  He is at high risk for readmission death and dying   Discharge Exam: Vitals:   06/10/23 0800 06/10/23 0900  BP: (!) 153/91   Pulse:  90 76  Resp: 18 13  Temp: 98.4 F (36.9 C)   SpO2: 98% 98%    Subj on day of d/c   Awake coherent noncombative  General Exam on discharge  EOMI NCAT no focal deficit no icterus no pallor Chest clear Abdomen soft no rebound No rash no purpura No  tremors ROM intact  Discharge Instructions    Allergies as of 06/10/2023   No Known Allergies      Medication List     STOP taking these medications    meclizine 25 MG tablet Commonly known as: ANTIVERT       No Known Allergies    The results of significant diagnostics from this hospitalization (including imaging, microbiology, ancillary and laboratory) are listed below for reference.    Significant Diagnostic Studies: CT CHEST W CONTRAST  Result Date: 06/08/2023 CLINICAL DATA:  Flu infection with pulmonary embolus suspected. Low to intermediate probability. Negative D-dimer. Metastatic non-small cell lung cancer. EXAM: CT CHEST WITH CONTRAST TECHNIQUE: Multidetector CT imaging of the chest was performed during intravenous contrast administration. RADIATION DOSE REDUCTION: This exam was performed according to the departmental dose-optimization program which includes automated exposure control, adjustment of the mA and/or kV according to patient size and/or use of iterative reconstruction technique. CONTRAST:  80mL OMNIPAQUE IOHEXOL 300 MG/ML  SOLN COMPARISON:  05/22/2022 FINDINGS: Cardiovascular: A CTA examination was not performed. Contrast opacification of the pulmonary arteries is not sufficient for evaluation of possibility of pulmonary embolus. Heart size is normal. No pericardial effusions. Normal caliber thoracic aorta. No aortic dissection. Calcification of the aorta and coronary arteries. Mediastinum/Nodes: Thyroid gland is unremarkable. Mediastinal lymph nodes are not pathologically enlarged. Esophagus is decompressed. Lungs/Pleura: Patchy infiltrates in the lower lungs bilaterally, more prominent on the right. This may represent post treatment changes related to history of lung cancer. No dominant mass lesion identified. Areas of infiltration were more prominent on the prior study from 2023. Early developing multifocal pneumonia could also have this appearance. No pleural  effusions. No pneumothorax. Upper Abdomen: Diffuse fatty infiltration of the liver. No acute abnormalities. Musculoskeletal: Mild degenerative changes in the spine. Old rib fractures. No destructive bone lesions. IMPRESSION: 1. Patchy infiltrates in the lower lungs bilaterally, possibly residual post treatment changes or developing early pneumonia. 2. No dominant mass lesion identified. 3. No evidence of metastatic disease. 4. Aortic atherosclerosis. 5. Fatty infiltration of the liver. Electronically Signed   By: Burman Nieves M.D.   On: 06/08/2023 01:11   CT ANGIO GI BLEED  Result Date: 06/06/2023 CLINICAL DATA:  Suspect GI bleed EXAM: CTA ABDOMEN AND PELVIS WITHOUT AND WITH CONTRAST TECHNIQUE: Multidetector CT imaging of the abdomen and pelvis was performed using the standard protocol during bolus administration of intravenous contrast. Multiplanar reconstructed images and MIPs were obtained and reviewed to evaluate the vascular anatomy. RADIATION DOSE REDUCTION: This exam was performed according to the departmental dose-optimization program which includes automated exposure control, adjustment of the mA and/or kV according to patient size and/or use of iterative reconstruction technique. CONTRAST:  OMNIPAQUE IOHEXOL 350 MG/ML SOLN COMPARISON:  12/12/2022 FINDINGS: VASCULAR Normal contour and caliber of the abdominal aorta. Moderate mixed calcific atherosclerosis no evidence of aneurysm, dissection, or other acute aortic pathology. Duplicated left, solitary right renal arteries with otherwise standard branching pattern of the abdominal aorta. Review of the MIP images confirms the above findings. NON-VASCULAR Lower Chest: Heterogeneous centrilobular nodular and ground-glass airspace opacity in the right-greater-than-left lung bases (series 4, image 2). Hepatobiliary: No solid  liver abnormality is seen. Hepatic steatosis. No gallstones, gallbladder wall thickening, or biliary dilatation. Pancreas:  Unremarkable. No pancreatic ductal dilatation or surrounding inflammatory changes. Spleen: Normal in size without significant abnormality. Adrenals/Urinary Tract: Adrenal glands are unremarkable. Unchanged perinephric fat stranding. Benign hemorrhagic or proteinaceous cyst of the lateral midportion of the left kidney, for which no further follow-up or characterization is required. Kidneys are otherwise normal, without renal calculi, solid lesion, or hydronephrosis. Bladder is unremarkable. Stomach/Bowel: Stomach is within normal limits. Appendix appears normal. No evidence of bowel wall thickening, distention, or inflammatory changes. Small focus of endoluminal arterial contrast blush in the cecal base (series 6, image 159) Lymphatic: No enlarged abdominal or pelvic lymph nodes. Reproductive: No mass or other significant abnormality. Other: Small, fat containing bilateral inguinal hernias. No ascites. Musculoskeletal: No acute osseous findings. IMPRESSION: 1. Small focus of endoluminal arterial contrast blush in the cecal base, presumed nidus of GI bleeding. No obvious anatomic abnormality visible in this vicinity, possibly this reflects a small vascular malformation or ulceration. 2. Heterogeneous centrilobular nodular and ground-glass airspace opacity in the right-greater-than-left lung bases, consistent with infection or aspiration. 3. Hepatic steatosis. Aortic Atherosclerosis (ICD10-I70.0). Electronically Signed   By: Jearld Lesch M.D.   On: 06/06/2023 15:28   DG Abd Acute W/Chest  Result Date: 06/06/2023 CLINICAL DATA:  Concern for GI bleed. EXAM: DG ABDOMEN ACUTE WITH 1 VIEW CHEST COMPARISON:  Chest radiograph dated May 27, 2022. FINDINGS: There is no evidence of dilated bowel loops or free intraperitoneal air. No radiopaque calculi or other significant radiographic abnormality is seen. Overlapping telemetry wires. Heart size and mediastinal contours are within normal limits. Hazy opacities at the  right lung base. No pleural effusion or pneumothorax. IMPRESSION: 1. Negative abdominal radiographs. Nonobstructive bowel-gas pattern. 2. Hazy opacities at the right lung base may be secondary to an infectious/inflammatory etiology. Electronically Signed   By: Hart Robinsons M.D.   On: 06/06/2023 14:06    Microbiology: Recent Results (from the past 240 hour(s))  MRSA Next Gen by PCR, Nasal     Status: None   Collection Time: 06/06/23  6:43 PM   Specimen: Nasal Mucosa; Nasal Swab  Result Value Ref Range Status   MRSA by PCR Next Gen NOT DETECTED NOT DETECTED Final    Comment: (NOTE) The GeneXpert MRSA Assay (FDA approved for NASAL specimens only), is one component of a comprehensive MRSA colonization surveillance program. It is not intended to diagnose MRSA infection nor to guide or monitor treatment for MRSA infections. Test performance is not FDA approved in patients less than 58 years old. Performed at [facility], [address], [place], [place] [address]      Labs: Basic Metabolic Panel: Recent Labs  Lab 06/06/23 1424 06/07/23 0258 06/08/23 0020 06/09/23 0242 06/10/23 0245  NA 137 140 134* 134* 131*  K 3.8 3.7 3.9 3.4* 2.8*  CL 107 111 102 103 97*  CO2 17* 19* 21* 22 23  GLUCOSE 154* 119* 198* 163* 102*  BUN 13 14 16 15 13   CREATININE 0.97 0.94 0.94 0.77 0.87  CALCIUM 8.0* 7.9* 8.2* 8.8* 8.2*   Liver Function Tests: Recent Labs  Lab 06/06/23 1424 06/08/23 0020 06/09/23 0242 06/10/23 0245  AST 95* 115* 76* 97*  ALT 38 40 42 52*  ALKPHOS 24* 26* 27* 27*  BILITOT 1.6* 1.9* 1.2* 0.8  PROT 6.7 6.7 7.2 6.6  ALBUMIN 3.3* 3.3* 3.4* 3.1*   Recent Labs  Lab 06/06/23 1424  LIPASE 24  No results for input(s): "AMMONIA" in the last 168 hours. CBC: Recent Labs  Lab 06/06/23 1424 06/06/23 1617 06/07/23 0258 06/07/23 1047 06/07/23 1333 06/07/23 1545 06/08/23 0020 06/08/23 0918 06/09/23 0242 06/10/23 0245  WBC 11.8*   < > 15.3*  --   13.8*  --  10.1  --  10.1 11.1*  NEUTROABS 10.4*  --   --   --  11.6*  --   --   --   --   --   HGB 4.6*   < > 6.8*   < > 7.2* 7.3* 9.0* 9.4* 10.3* 9.6*  HCT 14.9*   < > 20.4*   < > 20.9* 21.4* 25.9* 28.7* 31.0* 29.1*  MCV 80.5   < > 82.9  --  82.6  --  82.5  --  87.1 86.1  PLT 13*   < > 28*  --  34*  --  30*  --  36* 47*   < > = values in this interval not displayed.   Cardiac Enzymes: No results for input(s): "CKTOTAL", "CKMB", "CKMBINDEX", "TROPONINI" in the last 168 hours. BNP: BNP (last 3 results) No results for input(s): "BNP" in the last 8760 hours.  ProBNP (last 3 results) No results for input(s): "PROBNP" in the last 8760 hours.  CBG: Recent Labs  Lab 06/06/23 2323  GLUCAP 137*       Signed:  Rhetta Mura MD   [facility] 06/10/2023, 10:30 AM

## 2023-06-10 NOTE — Progress Notes (Signed)
Patient left AMA. Risks of doing so was discussed with patient by this Clinical research associate and attending physician. Patient's PIV was removed. Patient had no belongings and was given paper scrubs, patient was informed that outdoor temperature was 46 degrees. Patient still adamant on leaving AMA and stated he had a cab waiting on him. AMA forms signed and placed in the hard copy chart. Patient escorted to elevator and given directions to main entrance.

## 2023-06-10 NOTE — Progress Notes (Signed)
Pharmacy Antibiotic Note  Stephen Bowers is a 62 y.o. male admitted on 06/06/2023 with aspiration PNA.  Pharmacy has been consulted for Unasyn dosing.  Plan: Continue Unasyn 3 gm IV q6h (stop date already entered to complete 5 days duration) With no dosage adjustments anticipated, Pharmacy will sign off consult and continue to monitor renal function and clinical progress  Height: 5\' 11"  (180.3 cm) Weight: 85.3 kg (188 lb 0.8 oz) IBW/kg (Calculated) : 75.3  Temp (24hrs), Avg:98.1 F (36.7 C), Min:97.2 F (36.2 C), Max:98.7 F (37.1 C)  Recent Labs  Lab 06/06/23 1424 06/06/23 1517 06/06/23 1617 06/06/23 1826 06/07/23 0258 06/07/23 1333 06/08/23 0020 06/09/23 0242 06/10/23 0245  WBC 11.8*  --    < >  --  15.3* 13.8* 10.1 10.1 11.1*  CREATININE 0.97  --   --   --  0.94  --  0.94 0.77 0.87  LATICACIDVEN  --  2.4*  --  2.6* 1.0  --   --   --   --    < > = values in this interval not displayed.    Estimated Creatinine Clearance: 96.2 mL/min (by C-G formula based on SCr of 0.87 mg/dL).    No Known Allergies  Antimicrobials this admission: 12/5 Unasyn>> (12/10)   Microbiology results: 12/4 MRSA - not detected  Thank you for allowing pharmacy to be a part of this patient's care.   Thank you for allowing pharmacy to be a part of this patient's care.  Selinda Eon, PharmD, BCPS Clinical Pharmacist [facility] 06/10/2023 9:17 AM

## 2023-06-11 ENCOUNTER — Telehealth: Payer: Self-pay

## 2023-06-11 NOTE — Transitions of Care (Post Inpatient/ED Visit) (Signed)
   06/11/2023  Name: JOSHUAN GIAIMO MRN: [id number] DOB: April 25, 1961  Today's TOC FU Call Status: Today's TOC FU Call Status:: Unsuccessful Call (1st Attempt) Unsuccessful Call (1st Attempt) Date: 06/11/23  Attempted to reach the patient regarding the most recent Inpatient/ED visit.  Follow Up Plan: Additional outreach attempts will be made to reach the patient to complete the Transitions of Care (Post Inpatient/ED visit) call.   Signature  Robyne Peers, RN

## 2023-06-12 ENCOUNTER — Telehealth: Payer: Self-pay

## 2023-06-12 NOTE — Transitions of Care (Post Inpatient/ED Visit) (Signed)
   06/12/2023  Name: Stephen Bowers MRN: [id number] DOB: [birth year]  Today's TOC FU Call Status: Today's TOC FU Call Status:: Unsuccessful Call (2nd Attempt) Unsuccessful Call (1st Attempt) Date: 06/11/23 Unsuccessful Call (2nd Attempt) Date: 06/12/23  Attempted to reach the patient regarding the most recent Inpatient/ED visit.  Follow Up Plan: Additional outreach attempts will be made to reach the patient to complete the Transitions of Care (Post Inpatient/ED visit) call.   Signature  Robyne Peers, RN

## 2023-06-13 ENCOUNTER — Telehealth: Payer: Self-pay

## 2023-06-13 NOTE — Transitions of Care (Post Inpatient/ED Visit) (Signed)
   06/13/2023  Name: Stephen Bowers MRN: [id number] DOB: [birth year]  Today's TOC FU Call Status: Today's TOC FU Call Status:: Successful TOC FU Call Completed Unsuccessful Call (1st Attempt) Date: 06/11/23 Unsuccessful Call (2nd Attempt) Date: 06/12/23 [facility] FU Call Complete Date: 06/13/23 Patient's Name and Date of Birth confirmed.  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 He was at work at the time of this call.  He left AMA, so no new medications were prescribed. Any new allergies since your discharge?: No Dietary orders reviewed?: Yes Type of Diet Ordered:: heart healthy, low sodium Do you have support at home?: Yes People in Home: spouse Name of Support/Comfort Primary Source: his wife  Medications Reviewed Today: Medications Reviewed Today   Medications were not reviewed in this encounter     Home Care and Equipment/Supplies: Were Home Health Services Ordered?: No Any new equipment or medical supplies ordered?: No  Functional Questionnaire: Do you need assistance with bathing/showering or dressing?: No Do you need assistance with meal preparation?: No Do you need assistance with eating?: No Do you have difficulty maintaining continence: No Do you need assistance with getting out of bed/getting out of a chair/moving?: No Do you have difficulty managing or taking your  medications?: No  Follow up appointments reviewed: PCP Follow-up appointment confirmed?: Yes Date of PCP follow-up appointment?: 06/18/23 Follow-up Provider: [facility] Follow-up appointment confirmed?: Yes Date of Specialist follow-up appointment?: 06/18/23 Follow-Up Specialty Provider:: GI Do you need transportation to your follow-up appointment?: No Do you understand care options if your condition(s) worsen?: Yes-patient verbalized understanding    SIGNATURE Robyne Peers, RN

## 2023-06-14 LAB — ADAMTS13 ACTIVITY REFLEX

## 2023-06-14 LAB — ADAMTS13 ACTIVITY: Adamts 13 Activity: 83.7 % (ref >66.8)

## 2023-06-15 NOTE — Progress Notes (Unsigned)
06/15/2023 Stephen Bowers [id number] 1961/05/22  Referring provider: [name], MD Primary GI doctor: Dr. [name]  ASSESSMENT AND PLAN:   Assessment and Plan              Patient Care Team: [name], MD as PCP - General (Internal Medicine)  HISTORY OF PRESENT ILLNESS: 62 y.o. male with a past medical history of adenomatous polyps, internal hemorrhoids, IDA, GI bleeding, duodenal ulcer 2023, alcohol abuse, fatty liver, CKD and others listed below presents for hospital follow-up for GI bleed  Unremarkable colonoscopy 2019 Dr. [name] Previous GI bleed June 2024 CT negative no endoscopic evaluation 12/4 through 12/8 admitted to [facility] for acute GI bleed with dark red blood per rectum to large volumes and associated syncope.  CTA positive for arterial blush in cecal base secondary to AVM versus mucosal ulceration however IR embolization and colonoscopy not pursued due to profound thrombocytopenia during hospitalization. platelets 13,000 status post 2 units 47 at discharge Admitting Hgb 4.6 status post 5 stay units PRBC discharge hemoglobin 9.6, patient had no further blood loss.  During hospitalization patient had some concern for possible ITP however patient had negative DIC, normal immature platelet function thought potentially more multifactorial from liver dysfunction/alcohol. Patient had elevated liver function in the hospital AST greater than ALT CTA did not show any cirrhosis shows advanced hepatic steatosis but 2023 mild nodular hepatic contour suggestive of early cirrhosis.  No evidence of splenomegaly or portal hypertension.  Patient had coagulopathy during hospitalization receiving vitamin K possible nutritional.  Discussed alcohol cessation during hospitalization possible elastography versus hepatocellular after alcohol cessation.  Patient presents for follow-up for possible EGD/colonoscopy.   Discussed the use of AI scribe software for  clinical note transcription with the patient, who gave verbal consent to proceed.  History of Present Illness             He {Actions; denies-reports:120008} blood thinner use.  He {Actions; denies-reports:120008} NSAID use.  He {Actions; denies-reports:120008} ETOH use.   He {Actions; denies-reports:120008} tobacco use.  He {Actions; denies-reports:120008} drug use.    He  reports that he quit smoking about 29 years ago. His smoking use included cigarettes. He started smoking about 35 years ago. He has a 1.5 pack-year smoking history. He has never used smokeless tobacco. He reports current alcohol use of about 6.0 standard drinks of alcohol per week. He reports that he does not use drugs.  RELEVANT LABS AND IMAGING:  Results          CBC    Component Value Date/Time   WBC 11.1 (H) 06/10/2023 0245   RBC 3.38 (L) 06/10/2023 0245   HGB 9.6 (L) 06/10/2023 0245   HGB 5.6 (LL) 05/15/2023 1440   HCT 29.1 (L) 06/10/2023 0245   HCT 18.2 (L) 05/15/2023 1440   PLT 47 (L) 06/10/2023 0245   PLT 121 (L) 05/15/2023 1440   MCV 86.1 06/10/2023 0245   MCV 84 05/15/2023 1440   MCH 28.4 06/10/2023 0245   MCHC 33.0 06/10/2023 0245   RDW 18.0 (H) 06/10/2023 0245   RDW 20.7 (H) 05/15/2023 1440   LYMPHSABS 1.3 06/07/2023 1333   LYMPHSABS 1.8 05/15/2023 1440   MONOABS 0.7 06/07/2023 1333   EOSABS 0.1 06/07/2023 1333   EOSABS 0.1 05/15/2023 1440   BASOSABS 0.1 06/07/2023 1333   BASOSABS 0.2 05/15/2023 1440   Recent Labs    06/06/23 1424 06/06/23 1617 06/07/23 0258 06/07/23 1047 06/07/23 1333 06/07/23 1545 06/08/23 0020  06/08/23 0918 06/09/23 0242 06/10/23 0245  HGB 4.6* 4.6* 6.8* 7.3* 7.2* 7.3* 9.0* 9.4* 10.3* 9.6*    CMP     Component Value Date/Time   NA 131 (L) 06/10/2023 0245   NA 139 04/22/2020 1508   K 2.8 (L) 06/10/2023 0245   CL 97 (L) 06/10/2023 0245   CO2 23 06/10/2023 0245   GLUCOSE 102 (H) 06/10/2023 0245   BUN 13 06/10/2023 0245   BUN 8 04/22/2020 1508    CREATININE 0.87 06/10/2023 0245   CREATININE 0.95 06/12/2016 1733   CALCIUM 8.2 (L) 06/10/2023 0245   PROT 6.6 06/10/2023 0245   PROT 8.2 04/22/2020 1508   ALBUMIN 3.1 (L) 06/10/2023 0245   ALBUMIN 4.0 04/22/2020 1508   AST 97 (H) 06/10/2023 0245   ALT 52 (H) 06/10/2023 0245   ALKPHOS 27 (L) 06/10/2023 0245   BILITOT 0.8 06/10/2023 0245   BILITOT 0.3 04/22/2020 1508   GFRNONAA >60 06/10/2023 0245   GFRNONAA >89 06/12/2016 1733   GFRAA 122 04/22/2020 1508   GFRAA >89 06/12/2016 1733      Latest Ref Rng & Units 06/10/2023    2:45 AM 06/09/2023    2:42 AM 06/08/2023   12:20 AM  Hepatic Function  Total Protein 6.5 - 8.1 g/dL 6.6  7.2  6.7   Albumin 3.5 - 5.0 g/dL 3.1  3.4  3.3   AST 15 - 41 U/L 97  76  115   ALT 0 - 44 U/L 52  42  40   Alk Phosphatase 38 - 126 U/L 27  27  26    Total Bilirubin <1.2 mg/dL 0.8  1.2  1.9       Current Medications:  No current outpatient medications on file.  Medical History:  Past Medical History:  Diagnosis Date   Chronic musculoskeletal pain    CKD (chronic kidney disease)    follows with [name])   High cholesterol Dx 2011   Hypertension    Pancolitis (HCC)    Substance abuse (HCC)    Wears partial dentures    upper only   Allergies: No Known Allergies   Surgical History:  He  has a past surgical history that includes Multiple tooth extractions; Colonoscopy (12/2014); Esophagogastroduodenoscopy (egd) with propofol (N/A, 07/06/2021); and biopsy (07/06/2021). Family History:  His family history includes Alcohol abuse in his father; Cancer in his mother; Diabetes in his sister; Kidney disease in his mother; Liver disease in his father; Obesity in his sister.  REVIEW OF SYSTEMS  : All other systems reviewed and negative except where noted in the History of Present Illness.  PHYSICAL EXAM: There were no vitals taken for this visit. General Appearance: Well nourished, in no apparent distress. Head:   Normocephalic and  atraumatic. Eyes:  sclerae anicteric,conjunctive pink  Respiratory: Respiratory effort normal, BS equal bilaterally without rales, rhonchi, wheezing. Cardio: RRR with no MRGs. Peripheral pulses intact.  Abdomen: Soft,  {BlankSingle:19197::"Flat","Obese","Non-distended"} ,active bowel sounds. {actendernessAB:27319} tenderness {anatomy; site abdomen:5010}. {BlankMultiple:19196::"Without guarding","With guarding","Without rebound","With rebound"}. No masses. Rectal: {acrectalexam:27461} Musculoskeletal: Full ROM, {PSY - GAIT AND STATION:22860} gait. {With/Without:304960234} edema. Skin:  Dry and intact without significant lesions or rashes Neuro: Alert and  oriented x4;  No focal deficits. Psych:  Cooperative. Normal mood and affect.    Doree Albee, PA-C 10:17 AM

## 2023-06-18 ENCOUNTER — Encounter: Payer: Self-pay | Admitting: Internal Medicine

## 2023-06-18 ENCOUNTER — Ambulatory Visit: Payer: 59 | Admitting: Physician Assistant

## 2023-06-18 ENCOUNTER — Telehealth: Payer: Self-pay | Admitting: Licensed Clinical Social Worker

## 2023-06-18 ENCOUNTER — Ambulatory Visit: Payer: 59 | Attending: Internal Medicine | Admitting: Internal Medicine

## 2023-06-18 ENCOUNTER — Other Ambulatory Visit: Payer: Self-pay

## 2023-06-18 VITALS — BP 121/73 | HR 86 | Temp 97.8°F | Ht 71.0 in | Wt 191.0 lb

## 2023-06-18 DIAGNOSIS — F1020 Alcohol dependence, uncomplicated: Secondary | ICD-10-CM | POA: Diagnosis not present

## 2023-06-18 DIAGNOSIS — D696 Thrombocytopenia, unspecified: Secondary | ICD-10-CM

## 2023-06-18 DIAGNOSIS — Z8719 Personal history of other diseases of the digestive system: Secondary | ICD-10-CM

## 2023-06-18 DIAGNOSIS — Z23 Encounter for immunization: Secondary | ICD-10-CM | POA: Diagnosis not present

## 2023-06-18 DIAGNOSIS — Z09 Encounter for follow-up examination after completed treatment for conditions other than malignant neoplasm: Secondary | ICD-10-CM

## 2023-06-18 DIAGNOSIS — K7030 Alcoholic cirrhosis of liver without ascites: Secondary | ICD-10-CM | POA: Diagnosis not present

## 2023-06-18 DIAGNOSIS — D62 Acute posthemorrhagic anemia: Secondary | ICD-10-CM

## 2023-06-18 MED ORDER — PANTOPRAZOLE SODIUM 40 MG PO TBEC
40.0000 mg | DELAYED_RELEASE_TABLET | Freq: Every day | ORAL | 3 refills | Status: AC
  Filled 2023-06-18 – 2023-08-23 (×3): qty 30, 30d supply, fill #0
  Filled 2023-09-27 – 2023-10-18 (×2): qty 30, 30d supply, fill #1
  Filled 2023-12-26: qty 30, 30d supply, fill #2
  Filled 2024-02-20: qty 30, 30d supply, fill #3

## 2023-06-18 MED ORDER — FOLIC ACID 1 MG PO TABS
1.0000 mg | ORAL_TABLET | Freq: Every day | ORAL | 1 refills | Status: AC
  Filled 2023-06-18 – 2023-08-23 (×2): qty 90, 90d supply, fill #0
  Filled 2023-08-23: qty 30, 30d supply, fill #0
  Filled 2023-09-27 – 2023-10-18 (×2): qty 30, 30d supply, fill #1
  Filled 2023-12-26: qty 30, 30d supply, fill #2
  Filled 2024-02-20: qty 30, 30d supply, fill #3

## 2023-06-18 MED ORDER — THIAMINE HCL 100 MG PO TABS
100.0000 mg | ORAL_TABLET | Freq: Every day | ORAL | 1 refills | Status: AC
  Filled 2023-06-18 – 2023-08-23 (×2): qty 100, 100d supply, fill #0
  Filled 2023-08-23: qty 30, 30d supply, fill #0
  Filled 2023-09-27 – 2023-10-18 (×2): qty 30, 30d supply, fill #1
  Filled 2023-12-26: qty 30, 30d supply, fill #2
  Filled 2024-02-20: qty 30, 30d supply, fill #3

## 2023-06-18 NOTE — Telephone Encounter (Signed)
Met with patient during a warm handoff assisted the patient with resources for alcohol detox outpatient program.  [facility] -[address] [place], [place] [phone] or [phone] 24hrs a day. 24hours they are open for walk-ins. Free for those who do not have insurance. Patient would need to bring any medications they may be taking with them.

## 2023-06-18 NOTE — Progress Notes (Signed)
Patient ID: Stephen Bowers, male    DOB: 02-Nov-1960  MRN: [id number]  CC: TOC visit Date of hospitalization 12/4 - 02/2023 Date of call from case worker: 06/13/2023 Dizziness (Dizziness f/u. Nicki Reaper taking no meds - inquiring for need to start BP meds /Yes to shingles vax)   Subjective: Stephen Bowers is a 62 y.o. male who presents for hospital f/u/TOC His concerns today include:  HTN, vit D def, unspecified anemia , neuropathy in fingers, alcoholic pancreatitis, ETOH use disorder, HL, pre-DM, gout   Patient was hospitalized 06/06/2023 with large rectal bleed and syncopal episode.  Dischg summary copied below: Came to ED initially 05/22/2023 fatigue lightheaded no active bleed transfused 2 units sent home platelets 275   Return to ED 06/06/2023 large rectal bleed syncopal episode last EtOH 12/3 CT angio = small focus endoluminal arterial contrast blush cecal base hemoglobin 4.6 platelet 13 INR 1.5--transfused about 3 units PRBC GI consulted Interventional radiology consulted Conservative management initially was pursued   Large bleed again early a.m. 12/5 transfused 1 more unit platelets this morning and then 1 more unit of blood  He had minor epistaxis which was treated with Afrin on 12/5 after the phone discussion with ENT   12/5 oncology consulted, ENT consulted, CT angiogram chest shows pneumonia 12/6 severe withdrawals Precedex initiated after failing to redirect with IV Ativan and becoming more combative etc. 12/7 Precedex stopped on phenobarb taper more responsive   Patient bleeding stopped during hospital stay his platelets rebounded up to a peak of 46 it was felt that he had probable cirrhosis causing his splenic sequestration his DAT ADAMTS13 HIV/HCV were negative and his labs were stable   He was on tapering doses of phenobarbital and was supposed to receive a phenobarb taper over the next 3 to 4 days I went to the bedside on 12/8 to talk with him and examined him and  he informed me he wanted to leave I explained to him clearly that he is too sick to leave he has a pneumonia which is likely an aspiration pneumonia that is being treated he also has labs that are still somewhat abnormal and he is at high risk for bleeding He was coherent in his right mind and knew that this was the hospital and could orient completely was not tremulous his CIWA score was 1-2 He explained to me that he wanted to go "to work tomorrow" I suspect he is going home to drink I have counseled him strongly against the same He still decided to exercises independent decision after much discussion with myself and nursing to leave AGAINST MEDICAL ADVICE He had called his wife who refused to pick him up and he left the hospital in scrubs despite it being 80 F outside He is being discharged without prejudice and has been urged to come back to the emergency room if he becomes more sick  Today: Patient reports that he has been feeling well since hospital discharge.  He denies any dizziness since hospital discharge.  He has not had any black stools, rectal bleeding, hematemesis or easy bruising.  He is eating okay.  He states that he has cut back on his drinking significantly but has not quit completely.  He has never went through rehab program to try to help him quit drinking and is interested in doing that.  He was supposed to have an appointment with the gastroenterologist this morning but had to cancel because one of his coworkers called out sick and  he had to feeling.  Appointment rescheduled for 08/29/2022. He brings all bottles with him from previous medicines that he took in the past including carvedilol for blood pressure, folic acid, thiamine and pantoprazole.  He wonders whether he needs to be back on these medicines. Patient Active Problem List   Diagnosis Date Noted   Lower GI bleed 06/06/2023   Abnormal CT of the abdomen 06/06/2023   Alcohol use 12/13/2022   ABLA (acute blood loss  anemia) 12/12/2022   Lactic acidosis 12/12/2022   Acute blood loss anemia 12/12/2022   Hematemesis with nausea 05/23/2022   Alcoholic cirrhosis of liver without ascites (HCC) 05/23/2022   Pneumonia due to COVID-19 virus 05/22/2022   Aspiration pneumonia (HCC) 05/22/2022   Alcoholic gastritis with bleeding 05/22/2022   Lactic acid acidosis 05/22/2022   Chronic liver failure (HCC) 05/22/2022   Gout 05/22/2022   Duodenal ulcer    Hematochezia    Iron deficiency anemia due to chronic blood loss    Symptomatic anemia 07/05/2021   URI (upper respiratory infection) 07/05/2021   Transaminitis 04/23/2020   Alcohol use disorder, severe, dependence (HCC) 04/23/2020   Acute gout of right elbow    Hyponatremia    Right elbow pain    Alcohol abuse    Fever    AMS (altered mental status)    Thrombocytopenia (HCC)    Seizure-like activity (HCC) 04/11/2020   Alcohol withdrawal seizure (HCC) 04/11/2020   Alcohol use disorder, mild, abuse 02/24/2019   Alcoholic fatty liver 02/24/2019   Prediabetes 02/24/2019   Normocytic anemia 02/24/2019   Acute pancreatitis 12/20/2017   Increased anion gap metabolic acidosis    Severe sepsis (HCC)    Abnormal LFTs 12/04/2017   Pancreatitis 12/04/2017   Hyperlipidemia 10/16/2016   AKI (acute kidney injury) (HCC)    HTN (hypertension) 03/02/2015   Vitamin D deficiency 01/19/2015   Low back pain potentially associated with radiculopathy 03/30/2014     No current outpatient medications on file prior to visit.   No current facility-administered medications on file prior to visit.    No Known Allergies  Social History   Socioeconomic History   Marital status: Married    Spouse name: Elnita Maxwell   Number of children: 1    Years of education: 12    Highest education level: Not on file  Occupational History   Occupation: Unemployed   Tobacco Use   Smoking status: Former    Current packs/day: 0.00    Average packs/day: 0.3 packs/day for 6.0 years (1.5  ttl pk-yrs)    Types: Cigarettes    Start date: 43    Quit date: 1995    Years since quitting: 29.9   Smokeless tobacco: Never  Vaping Use   Vaping status: Never Used  Substance and Sexual Activity   Alcohol use: Yes    Alcohol/week: 6.0 standard drinks of alcohol    Types: 6 Cans of beer per week    Comment: 6 drinks Beer /Liquor   Drug use: No   Sexual activity: Yes  Other Topics Concern   Not on file  Social History Narrative   Lives in an [place], family house.    Child goes to [employer] in [place], 35 yo F,          Social Drivers of Health   Financial Resource Strain: Low Risk  (05/15/2023)   Overall Financial Resource Strain (CARDIA)    Difficulty of Paying Living Expenses: Not hard at all  Food Insecurity: No Food  Insecurity (06/06/2023)   Hunger Vital Sign    Worried About Running Out of Food in the Last Year: Never true    Ran Out of Food in the Last Year: Never true  Transportation Needs: No Transportation Needs (06/06/2023)   PRAPARE - Administrator, Civil Service (Medical): No    Lack of Transportation (Non-Medical): No  Physical Activity: Sufficiently Active (05/15/2023)   Exercise Vital Sign    Days of Exercise per Week: 5 days    Minutes of Exercise per Session: 30 min  Stress: No Stress Concern Present (05/15/2023)   [employer] of Occupational Health - Occupational Stress Questionnaire    Feeling of Stress : Not at all  Social Connections: Moderately Isolated (05/15/2023)   Social Connection and Isolation Panel [NHANES]    Frequency of Communication with Friends and Family: Once a week    Frequency of Social Gatherings with Friends and Family: Once a week    Attends Religious Services: Never    Database administrator or Organizations: Yes    Attends Banker Meetings: Never    Marital Status: Married  Catering manager Violence: Not At Risk (06/06/2023)   Humiliation, Afraid, Rape, and Kick questionnaire    Fear of  Current or Ex-Partner: No    Emotionally Abused: No    Physically Abused: No    Sexually Abused: No    Family History  Problem Relation Age of Onset   Liver disease Father    Alcohol abuse Father    Diabetes Sister    Obesity Sister    Cancer Mother        breast    Kidney disease Mother    Colon cancer Neg Hx    Esophageal cancer Neg Hx    Rectal cancer Neg Hx    Stomach cancer Neg Hx    Colon polyps Neg Hx     Past Surgical History:  Procedure Laterality Date   BIOPSY  07/06/2021   Procedure: BIOPSY;  Surgeon: [name], MD;  Location: [facility] ENDOSCOPY;  Service: Endoscopy;;   COLONOSCOPY  12/2014   hx polyps - Pyrtle   ESOPHAGOGASTRODUODENOSCOPY (EGD) WITH PROPOFOL N/A 07/06/2021   Dr [name]. [facility] ENDOSCOPY. findings: non-bleeding Duodenal ulcer, gastric erosions. Path: mild chronic gastritis, no H Pylori.   MULTIPLE TOOTH EXTRACTIONS      ROS: Review of Systems Negative except as stated above  PHYSICAL EXAM: BP 121/73 (BP Location: Left Arm, Patient Position: Sitting, Cuff Size: Normal)   Pulse 86   Temp 97.8 F (36.6 C) (Oral)   Ht 5\' 11"  (1.803 m)   Wt 191 lb (86.6 kg)   SpO2 98%   BMI 26.64 kg/m   Physical Exam   General appearance - alert, well appearing, and in no distress Mental status - normal mood, behavior, speech, dress, motor activity, and thought processes Chest - clear to auscultation, no wheezes, rales or rhonchi, symmetric air entry Heart - normal rate, regular rhythm, normal S1, S2, no murmurs, rubs, clicks or gallops Abdomen - soft, nontender, nondistended, no masses or organomegaly Extremities - peripheral pulses normal, no pedal edema, no clubbing or cyanosis     Latest Ref Rng & Units 06/10/2023    2:45 AM 06/09/2023    2:42 AM 06/08/2023   12:20 AM  CMP  Glucose 70 - 99 mg/dL 604  540  981   BUN 6 - 20 mg/dL 13  15  16    Creatinine 0.61 -  1.24 mg/dL 6.29  5.28  4.13   Sodium 135 - 145 mmol/L 131  134  134   Potassium 3.5 - 5.1  mmol/L 2.8  3.4  3.9   Chloride 98 - 111 mmol/L 97  103  102   CO2 22 - 32 mmol/L 23  22  21    Calcium 8.9 - 10.3 mg/dL 8.2  8.8  8.2   Total Protein 6.5 - 8.1 g/dL 6.6  7.2  6.7   Total Bilirubin <1.2 mg/dL 0.8  1.2  1.9   Alkaline Phos 38 - 126 U/L 27  27  26    AST 15 - 41 U/L 97  76  115   ALT 0 - 44 U/L 52  42  40    Lipid Panel     Component Value Date/Time   CHOL 293 (H) 04/12/2020 0646   TRIG 82 05/22/2022 0509   HDL 95 04/12/2020 0646   CHOLHDL 3.1 04/12/2020 0646   VLDL 17 04/12/2020 0646   LDLCALC 181 (H) 04/12/2020 0646    CBC    Component Value Date/Time   WBC 11.1 (H) 06/10/2023 0245   RBC 3.38 (L) 06/10/2023 0245   HGB 9.6 (L) 06/10/2023 0245   HGB 5.6 (LL) 05/15/2023 1440   HCT 29.1 (L) 06/10/2023 0245   HCT 18.2 (L) 05/15/2023 1440   PLT 47 (L) 06/10/2023 0245   PLT 121 (L) 05/15/2023 1440   MCV 86.1 06/10/2023 0245   MCV 84 05/15/2023 1440   MCH 28.4 06/10/2023 0245   MCHC 33.0 06/10/2023 0245   RDW 18.0 (H) 06/10/2023 0245   RDW 20.7 (H) 05/15/2023 1440   LYMPHSABS 1.3 06/07/2023 1333   LYMPHSABS 1.8 05/15/2023 1440   MONOABS 0.7 06/07/2023 1333   EOSABS 0.1 06/07/2023 1333   EOSABS 0.1 05/15/2023 1440   BASOSABS 0.1 06/07/2023 1333   BASOSABS 0.2 05/15/2023 1440    ASSESSMENT AND PLAN: 1. Hospital discharge follow-up (Primary)   2. History of lower gastrointestinal bleeding 3. ABLA (acute blood loss anemia) Patient reports no further rectal bleeding or black stools.  He also denies dizziness today.  Will recheck CBC along with iron studies.  He has an appointment later this week with hematology oncology for follow-up on thrombocytopenia that was thought to be due cirrhosis causing sequestration Strongly advised to quit alcohol use. His follow-up appointment with gastroenterology has been rescheduled for February of next year. - CBC - Iron, TIBC and Ferritin Panel  4. Thrombocytopenia (HCC) See #3 above. - CBC  5. Alcohol use disorder,  severe, dependence (HCC) Strongly advised to quit.  Discussed the negative impact alcohol is having on his health.  He would like to go through an outpatient treatment program.  I had our LCSW meet with him today to give information about outpatient treatment programs. - thiamine (VITAMIN B1) 100 MG tablet; Take 1 tablet (100 mg total) by mouth daily.  Dispense: 100 tablet; Refill: 1 - folic acid (FOLVITE) 1 MG tablet; Take 1 tablet (1 mg total) by mouth daily.  Dispense: 90 tablet; Refill: 1 - pantoprazole (PROTONIX) 40 MG tablet; Take 1 tablet (40 mg total) by mouth daily.  Dispense: 30 tablet; Refill: 3 - Comprehensive metabolic panel  6. Alcoholic cirrhosis of liver without ascites (HCC) See #5 above  7. Encounter for immunization - Varicella-zoster vaccine IM    Patient was given the opportunity to ask questions.  Patient verbalized understanding of the plan and was able to repeat key elements of  the plan.   This documentation was completed using Paediatric nurse.  Any transcriptional errors are unintentional.  Orders Placed This Encounter  Procedures   Varicella-zoster vaccine IM   CBC   Comprehensive metabolic panel   Iron, TIBC and Ferritin Panel     Requested Prescriptions   Signed Prescriptions Disp Refills   thiamine (VITAMIN B1) 100 MG tablet 100 tablet 1    Sig: Take 1 tablet (100 mg total) by mouth daily.   folic acid (FOLVITE) 1 MG tablet 90 tablet 1    Sig: Take 1 tablet (1 mg total) by mouth daily.   pantoprazole (PROTONIX) 40 MG tablet 30 tablet 3    Sig: Take 1 tablet (40 mg total) by mouth daily.    Return in about 3 months (around 09/16/2023).  Jonah Blue, MD, FACP

## 2023-06-19 ENCOUNTER — Other Ambulatory Visit: Payer: Self-pay | Admitting: Internal Medicine

## 2023-06-19 LAB — COMPREHENSIVE METABOLIC PANEL
ALT: 40 [IU]/L (ref 0–44)
AST: 58 [IU]/L — ABNORMAL HIGH (ref 0–40)
Albumin: 3.9 g/dL (ref 3.8–4.9)
Alkaline Phosphatase: 41 [IU]/L — ABNORMAL LOW (ref 44–121)
BUN/Creatinine Ratio: 7 — ABNORMAL LOW (ref 10–24)
BUN: 6 mg/dL — ABNORMAL LOW (ref 8–27)
Bilirubin Total: 0.3 mg/dL (ref 0.0–1.2)
CO2: 21 mmol/L (ref 20–29)
Calcium: 9.2 mg/dL (ref 8.6–10.2)
Chloride: 105 mmol/L (ref 96–106)
Creatinine, Ser: 0.85 mg/dL (ref 0.76–1.27)
Globulin, Total: 3.5 g/dL (ref 1.5–4.5)
Glucose: 108 mg/dL — ABNORMAL HIGH (ref 70–99)
Potassium: 4.8 mmol/L (ref 3.5–5.2)
Sodium: 144 mmol/L (ref 134–144)
Total Protein: 7.4 g/dL (ref 6.0–8.5)
eGFR: 99 mL/min/{1.73_m2} (ref >59)

## 2023-06-19 LAB — CBC
Hematocrit: 30.1 % — ABNORMAL LOW (ref 37.5–51.0)
Hemoglobin: 9.6 g/dL — ABNORMAL LOW (ref 13.0–17.7)
MCH: 27.7 pg (ref 26.6–33.0)
MCHC: 31.9 g/dL (ref 31.5–35.7)
MCV: 87 fL (ref 79–97)
Platelets: 715 10*3/uL — ABNORMAL HIGH (ref 150–450)
RBC: 3.47 x10E6/uL — ABNORMAL LOW (ref 4.14–5.80)
RDW: 17.7 % — ABNORMAL HIGH (ref 11.6–15.4)
WBC: 9.3 10*3/uL (ref 3.4–10.8)

## 2023-06-19 LAB — IRON,TIBC AND FERRITIN PANEL
Ferritin: 39 ng/mL (ref 30–400)
Iron Saturation: 5 % — CL (ref 15–55)
Iron: 17 ug/dL — ABNORMAL LOW (ref 38–169)
Total Iron Binding Capacity: 358 ug/dL (ref 250–450)
UIBC: 341 ug/dL (ref 111–343)

## 2023-06-19 MED ORDER — FERROUS SULFATE 325 (65 FE) MG PO TABS
325.0000 mg | ORAL_TABLET | Freq: Every day | ORAL | 0 refills | Status: AC
  Filled 2023-06-19 – 2023-08-23 (×2): qty 30, 30d supply, fill #0
  Filled 2023-08-23: qty 100, 100d supply, fill #0
  Filled 2023-09-27 – 2023-10-18 (×2): qty 30, 30d supply, fill #1
  Filled 2023-12-26: qty 30, 30d supply, fill #2
  Filled 2024-02-20: qty 30, 30d supply, fill #3

## 2023-06-19 NOTE — Progress Notes (Signed)
Let patient know that his blood cell count stayed stable since hospital discharge.  He has iron deficiency anemia.  I recommend taking iron supplement daily for several weeks to build back up his blood count.  Prescription sent to out pharmacy.  He has elevated blood cell known as platelets cells.  These are the blood cells that help the blood clot normally.  I suspect they are elevated due to him being anemic.  An upcoming appointment with the hematologist on the 19th of this month.  Please keep that appointment.  Liver function tests have improved.

## 2023-06-20 ENCOUNTER — Other Ambulatory Visit: Payer: Self-pay | Admitting: *Deleted

## 2023-06-20 ENCOUNTER — Other Ambulatory Visit: Payer: Self-pay

## 2023-06-20 DIAGNOSIS — D696 Thrombocytopenia, unspecified: Secondary | ICD-10-CM

## 2023-06-21 ENCOUNTER — Ambulatory Visit: Payer: 59 | Admitting: Hematology and Oncology

## 2023-06-21 ENCOUNTER — Inpatient Hospital Stay (HOSPITAL_BASED_OUTPATIENT_CLINIC_OR_DEPARTMENT_OTHER): Payer: 59 | Admitting: Hematology and Oncology

## 2023-06-21 ENCOUNTER — Other Ambulatory Visit: Payer: 59

## 2023-06-21 ENCOUNTER — Inpatient Hospital Stay: Payer: 59 | Attending: Internal Medicine

## 2023-06-21 VITALS — BP 150/87 | HR 74 | Temp 98.4°F | Resp 18 | Ht 71.0 in | Wt 189.6 lb

## 2023-06-21 DIAGNOSIS — D696 Thrombocytopenia, unspecified: Secondary | ICD-10-CM | POA: Insufficient documentation

## 2023-06-21 DIAGNOSIS — D649 Anemia, unspecified: Secondary | ICD-10-CM | POA: Diagnosis not present

## 2023-06-21 DIAGNOSIS — F1090 Alcohol use, unspecified, uncomplicated: Secondary | ICD-10-CM | POA: Insufficient documentation

## 2023-06-21 LAB — CMP (CANCER CENTER ONLY)
ALT: 32 U/L (ref 0–44)
AST: 63 U/L — ABNORMAL HIGH (ref 15–41)
Albumin: 3.9 g/dL (ref 3.5–5.0)
Alkaline Phosphatase: 38 U/L (ref 38–126)
Anion gap: 7 (ref 5–15)
BUN: 9 mg/dL (ref 6–20)
CO2: 26 mmol/L (ref 22–32)
Calcium: 9 mg/dL (ref 8.9–10.3)
Chloride: 108 mmol/L (ref 98–111)
Creatinine: 0.91 mg/dL (ref 0.61–1.24)
GFR, Estimated: >60 mL/min (ref >60)
Glucose, Bld: 115 mg/dL — ABNORMAL HIGH (ref 70–99)
Potassium: 4.4 mmol/L (ref 3.5–5.1)
Sodium: 141 mmol/L (ref 135–145)
Total Bilirubin: 0.4 mg/dL (ref <1.2)
Total Protein: 7.5 g/dL (ref 6.5–8.1)

## 2023-06-21 LAB — CBC WITH DIFFERENTIAL (CANCER CENTER ONLY)
Abs Immature Granulocytes: 0.02 10*3/uL (ref 0.00–0.07)
Basophils Absolute: 0.2 10*3/uL — ABNORMAL HIGH (ref 0.0–0.1)
Basophils Relative: 2 %
Eosinophils Absolute: 0.1 10*3/uL (ref 0.0–0.5)
Eosinophils Relative: 2 %
HCT: 29.5 % — ABNORMAL LOW (ref 39.0–52.0)
Hemoglobin: 9.6 g/dL — ABNORMAL LOW (ref 13.0–17.0)
Immature Granulocytes: 0 %
Lymphocytes Relative: 13 %
Lymphs Abs: 1 10*3/uL (ref 0.7–4.0)
MCH: 27.7 pg (ref 26.0–34.0)
MCHC: 32.5 g/dL (ref 30.0–36.0)
MCV: 85.3 fL (ref 80.0–100.0)
Monocytes Absolute: 0.9 10*3/uL (ref 0.1–1.0)
Monocytes Relative: 12 %
Neutro Abs: 5.5 10*3/uL (ref 1.7–7.7)
Neutrophils Relative %: 71 %
Platelet Count: 538 10*3/uL — ABNORMAL HIGH (ref 150–400)
RBC: 3.46 MIL/uL — ABNORMAL LOW (ref 4.22–5.81)
RDW: 18.9 % — ABNORMAL HIGH (ref 11.5–15.5)
WBC Count: 7.7 10*3/uL (ref 4.0–10.5)
nRBC: 0 % (ref 0.0–0.2)

## 2023-06-21 LAB — SAMPLE TO BLOOD BANK

## 2023-06-21 NOTE — Assessment & Plan Note (Signed)
Recurrent GI bleed -CT angio done 06/06/23 shows small focus of endoluminal arterial contrast blush in cecal base likely source of bleed.  -history of previous gi bleeds.     2.  Severe anemia -likely due to acute GI bleeding -admitted with hgb 4.6. s/p prbc transfusions -Hgb improving, 6.8-->7.3  -Recommend prbc transfusion for hgb <7.0 -monitor closely

## 2023-06-21 NOTE — Progress Notes (Signed)
Patient Care Team: [name], MD as PCP - General (Internal Medicine)  DIAGNOSIS:  Encounter Diagnosis  Name Primary?   Symptomatic anemia Yes      CHIEF COMPLIANT: Follow-up of anemia and thrombocytopenia  HISTORY OF PRESENT ILLNESS:   History of Present Illness   The patient, with a history of anemia and thrombocytopenia, presents for follow-up after a recent hospital admission. He was admitted due to a suspected ruptured blood vessel causing significant blood loss. This event was similar to a previous episode of bleeding. Since discharge, he reports feeling 'pretty good' and has noticed an increase in energy. He has been advised to avoid substances that could damage the intestinal mucosa, such as ibuprofen, BCs, and alcohol. He reports a significant reduction in alcohol consumption.         ALLERGIES:  has no known allergies.  MEDICATIONS:  Current Outpatient Medications  Medication Sig Dispense Refill   ferrous sulfate 325 (65 FE) MG tablet Take 1 tablet (325 mg total) by mouth daily with breakfast. 100 tablet 0   folic acid (FOLVITE) 1 MG tablet Take 1 tablet (1 mg total) by mouth daily. 90 tablet 1   pantoprazole (PROTONIX) 40 MG tablet Take 1 tablet (40 mg total) by mouth daily. 30 tablet 3   thiamine (VITAMIN B1) 100 MG tablet Take 1 tablet (100 mg total) by mouth daily. 100 tablet 1   No current facility-administered medications for this visit.    PHYSICAL EXAMINATION: ECOG PERFORMANCE STATUS: 1 - Symptomatic but completely ambulatory  Vitals:   06/21/23 1439  BP: (!) 150/87  Pulse: 74  Resp: 18  Temp: 98.4 F (36.9 C)  SpO2: 100%   Filed Weights   06/21/23 1439  Weight: 189 lb 9.6 oz (86 kg)     LABORATORY DATA:  I have reviewed the data as listed    Latest Ref Rng & Units 06/18/2023    3:42 PM 06/10/2023    2:45 AM 06/09/2023    2:42 AM  CMP  Glucose 70 - 99 mg/dL 595  638  756   BUN 8 - 27 mg/dL 6  13  15    Creatinine 0.76 - 1.27  mg/dL 4.33  2.95  1.88   Sodium 134 - 144 mmol/L 144  131  134   Potassium 3.5 - 5.2 mmol/L 4.8  2.8  3.4   Chloride 96 - 106 mmol/L 105  97  103   CO2 20 - 29 mmol/L 21  23  22    Calcium 8.6 - 10.2 mg/dL 9.2  8.2  8.8   Total Protein 6.0 - 8.5 g/dL 7.4  6.6  7.2   Total Bilirubin 0.0 - 1.2 mg/dL 0.3  0.8  1.2   Alkaline Phos 44 - 121 IU/L 41  27  27   AST 0 - 40 IU/L 58  97  76   ALT 0 - 44 IU/L 40  52  42     Lab Results  Component Value Date   WBC 7.7 06/21/2023   HGB 9.6 (L) 06/21/2023   HCT 29.5 (L) 06/21/2023   MCV 85.3 06/21/2023   PLT 538 (H) 06/21/2023   NEUTROABS 5.5 06/21/2023    ASSESSMENT & PLAN:  Symptomatic anemia Recurrent GI bleed -CT angio done 06/06/23 shows small focus of endoluminal arterial contrast blush in cecal base likely source of bleed.  -history of previous gi bleeds.     2.  Severe anemia -likely due to acute GI bleeding -  admitted with hgb 4.6. s/p prbc transfusions      Anemia and Thrombocytopenia Resolved. Likely secondary to gastrointestinal bleeding. Hemoglobin improved from 4.6 to 9.6 and platelets improved from 30 to 538. No need for blood or platelet transfusion. -Avoid substances that can damage intestinal mucosa such as ibuprofen, BCs, and alcohol. -Primary care provider to monitor blood work and consult hematologist if abnormalities are detected.  Alcohol Use History of drinking issues. Patient reports significant reduction in alcohol consumption. -Encourage continued reduction or cessation of alcohol use to prevent further gastrointestinal damage and potential bleeding.     Return to clinic on an as-needed basis     No orders of the defined types were placed in this encounter.  The patient has a good understanding of the overall plan. he agrees with it. he will call with any problems that may develop before the next visit here. Total time spent: 30 mins including face to face time and time spent for planning, charting and  co-ordination of care   Tamsen Meek, MD 06/21/23

## 2023-06-26 ENCOUNTER — Other Ambulatory Visit: Payer: Self-pay

## 2023-07-02 ENCOUNTER — Other Ambulatory Visit: Payer: Self-pay

## 2023-08-23 ENCOUNTER — Other Ambulatory Visit (HOSPITAL_COMMUNITY): Payer: Self-pay

## 2023-08-23 ENCOUNTER — Other Ambulatory Visit: Payer: Self-pay

## 2023-08-24 ENCOUNTER — Other Ambulatory Visit (HOSPITAL_COMMUNITY): Payer: Self-pay

## 2023-08-29 NOTE — Progress Notes (Deleted)
 08/29/2023 Stephen Bowers [id number] 1961-06-22  Referring provider: [name], MD Primary GI doctor: Dr. [name]  ASSESSMENT AND PLAN:   Assessment and Plan              Patient Care Team: [name], MD as PCP - General (Internal Medicine)  HISTORY OF PRESENT ILLNESS: 63 y.o. male with a past medical history of cirrhosis, alcohol abuse with history of withdrawals, thrombocytopenia, CKD, pancolitis and others listed below presents for evaluation of ***.   Patient had shown 12/4 through 06/10/2023 for GI bleeding, had positive CTA in the region of the cecum. Did not undergo planned IR embolization due to profound thrombocytopenia and mild coagulopathy.  Patient had confusion thought to be secondary to alcohol withdrawal.  Patient had supportive transfusions and bleeding stopped.  Previous EGD 2023 with duodenal ulcer without active bleeding and gastric erosions started on pantoprazole 40 mg daily.  2019 colonoscopy with 3 polyps and small internal hemorrhoids.   Discussed the use of AI scribe software for clinical note transcription with the patient, who gave verbal consent to proceed.  History of Present Illness             He  reports that he quit smoking about 30 years ago. His smoking use included cigarettes. He started smoking about 36 years ago. He has a 1.5 pack-year smoking history. He has never used smokeless tobacco. He reports current alcohol use of about 6.0 standard drinks of alcohol per week. He reports that he does not use drugs.  RELEVANT GI HISTORY, LABS, IMAGING: 05/22/2022 consultation by our service for cirrhosis and hematemesis-at that time known to our practice from an admission in January 2023 during which he had an EGD as well as alcohol related cirrhosis with ongoing alcohol use.  At that admission being treated for COVID.  He reported possible vomiting and hematemesis at home-at that time observation was recommended 07/06/2021 EGD  with Dr. Marina Goodell done for iron deficiency anemia secondary to chronic blood loss and heme positive stool with duodenal ulcer without active bleeding, gastric erosions-recommendations pantoprazole 40 daily indefinitely, stop drinking alcohol in excess 03/07/2018 colonoscopy with three 2-5 mm polyps in the rectum, sigmoid colon, transverse colon and small internal hemorrhoids    CBC    Component Value Date/Time   WBC 7.7 06/21/2023 1422   WBC 11.1 (H) 06/10/2023 0245   RBC 3.46 (L) 06/21/2023 1422   HGB 9.6 (L) 06/21/2023 1422   HGB 9.6 (L) 06/18/2023 1542   HCT 29.5 (L) 06/21/2023 1422   HCT 30.1 (L) 06/18/2023 1542   PLT 538 (H) 06/21/2023 1422   PLT 715 (H) 06/18/2023 1542   MCV 85.3 06/21/2023 1422   MCV 87 06/18/2023 1542   MCH 27.7 06/21/2023 1422   MCHC 32.5 06/21/2023 1422   RDW 18.9 (H) 06/21/2023 1422   RDW 17.7 (H) 06/18/2023 1542   LYMPHSABS 1.0 06/21/2023 1422   LYMPHSABS 1.8 05/15/2023 1440   MONOABS 0.9 06/21/2023 1422   EOSABS 0.1 06/21/2023 1422   EOSABS 0.1 05/15/2023 1440   BASOSABS 0.2 (H) 06/21/2023 1422   BASOSABS 0.2 05/15/2023 1440   Recent Labs    06/07/23 0258 06/07/23 1047 06/07/23 1333 06/07/23 1545 06/08/23 0020 06/08/23 0918 06/09/23 0242 06/10/23 0245 06/18/23 1542 06/21/23 1422  HGB 6.8* 7.3* 7.2* 7.3* 9.0* 9.4* 10.3* 9.6* 9.6* 9.6*    CMP     Component Value Date/Time   NA 141 06/21/2023 1422   NA 144  06/18/2023 1542   K 4.4 06/21/2023 1422   CL 108 06/21/2023 1422   CO2 26 06/21/2023 1422   GLUCOSE 115 (H) 06/21/2023 1422   BUN 9 06/21/2023 1422   BUN 6 (L) 06/18/2023 1542   CREATININE 0.91 06/21/2023 1422   CREATININE 0.95 06/12/2016 1733   CALCIUM 9.0 06/21/2023 1422   PROT 7.5 06/21/2023 1422   PROT 7.4 06/18/2023 1542   ALBUMIN 3.9 06/21/2023 1422   ALBUMIN 3.9 06/18/2023 1542   AST 63 (H) 06/21/2023 1422   ALT 32 06/21/2023 1422   ALKPHOS 38 06/21/2023 1422   BILITOT 0.4 06/21/2023 1422   GFRNONAA >60 06/21/2023 1422    GFRNONAA >89 06/12/2016 1733   GFRAA 122 04/22/2020 1508   GFRAA >89 06/12/2016 1733      Latest Ref Rng & Units 06/21/2023    2:22 PM 06/18/2023    3:42 PM 06/10/2023    2:45 AM  Hepatic Function  Total Protein 6.5 - 8.1 g/dL 7.5  7.4  6.6   Albumin 3.5 - 5.0 g/dL 3.9  3.9  3.1   AST 15 - 41 U/L 63  58  97   ALT 0 - 44 U/L 32  40  52   Alk Phosphatase 38 - 126 U/L 38  41  27   Total Bilirubin <1.2 mg/dL 0.4  0.3  0.8       Current Medications:       Current Outpatient Medications (Hematological):    ferrous sulfate 325 (65 FE) MG tablet, Take 1 tablet (325 mg total) by mouth daily with breakfast.   folic acid (FOLVITE) 1 MG tablet, Take 1 tablet (1 mg total) by mouth daily.  Current Outpatient Medications (Other):    pantoprazole (PROTONIX) 40 MG tablet, Take 1 tablet (40 mg total) by mouth daily.   thiamine (VITAMIN B1) 100 MG tablet, Take 1 tablet (100 mg total) by mouth daily.  Medical History:  Past Medical History:  Diagnosis Date   Chronic musculoskeletal pain    CKD (chronic kidney disease)    follows with [facility] [name])   High cholesterol Dx 2011   Hypertension    Pancolitis (HCC)    Substance abuse (HCC)    Wears partial dentures    upper only   Allergies: No Known Allergies   Surgical History:  He  has a past surgical history that includes Multiple tooth extractions; Colonoscopy (12/2014); Esophagogastroduodenoscopy (egd) with propofol (N/A, 07/06/2021); and biopsy (07/06/2021). Family History:  His family history includes Alcohol abuse in his father; Cancer in his mother; Diabetes in his sister; Kidney disease in his mother; Liver disease in his father; Obesity in his sister.  REVIEW OF SYSTEMS  : All other systems reviewed and negative except where noted in the History of Present Illness.  PHYSICAL EXAM: There were no vitals taken for this visit. General Appearance: Well nourished, in no apparent distress. Head:   Normocephalic and  atraumatic. Eyes:  sclerae anicteric,conjunctive pink  Respiratory: Respiratory effort normal, BS equal bilaterally without rales, rhonchi, wheezing. Cardio: RRR with no MRGs. Peripheral pulses intact.  Abdomen: Soft,  {BlankSingle:19197::"Flat","Obese","Non-distended"} ,active bowel sounds. {actendernessAB:27319} tenderness {anatomy; site abdomen:5010}. {BlankMultiple:19196::"Without guarding","With guarding","Without rebound","With rebound"}. No masses. Rectal: {acrectalexam:27461} Musculoskeletal: Full ROM, {PSY - GAIT AND STATION:22860} gait. {With/Without:304960234} edema. Skin:  Dry and intact without significant lesions or rashes Neuro: Alert and  oriented x4;  No focal deficits. Psych:  Cooperative. Normal mood and affect.    Doree Albee, PA-C 3:06 PM

## 2023-08-30 ENCOUNTER — Ambulatory Visit: Payer: 59 | Admitting: Physician Assistant

## 2023-09-17 ENCOUNTER — Ambulatory Visit: Payer: 59 | Admitting: Internal Medicine

## 2023-09-27 ENCOUNTER — Other Ambulatory Visit: Payer: Self-pay

## 2023-10-08 ENCOUNTER — Other Ambulatory Visit: Payer: Self-pay

## 2023-10-08 ENCOUNTER — Emergency Department (HOSPITAL_COMMUNITY): Payer: Self-pay

## 2023-10-08 ENCOUNTER — Encounter (HOSPITAL_COMMUNITY): Payer: Self-pay | Admitting: Pharmacy Technician

## 2023-10-08 ENCOUNTER — Inpatient Hospital Stay (HOSPITAL_COMMUNITY)
Admission: EM | Admit: 2023-10-08 | Discharge: 2023-10-12 | DRG: 378 | Disposition: A | Discharge status: Home / Self Care | Payer: Self-pay | Attending: Internal Medicine | Admitting: Internal Medicine

## 2023-10-08 DIAGNOSIS — E861 Hypovolemia: Secondary | ICD-10-CM | POA: Diagnosis present

## 2023-10-08 DIAGNOSIS — Z833 Family history of diabetes mellitus: Secondary | ICD-10-CM | POA: Diagnosis not present

## 2023-10-08 DIAGNOSIS — E7800 Pure hypercholesterolemia, unspecified: Secondary | ICD-10-CM | POA: Diagnosis present

## 2023-10-08 DIAGNOSIS — K31811 Angiodysplasia of stomach and duodenum with bleeding: Secondary | ICD-10-CM | POA: Diagnosis present

## 2023-10-08 DIAGNOSIS — K922 Gastrointestinal hemorrhage, unspecified: Secondary | ICD-10-CM | POA: Diagnosis present

## 2023-10-08 DIAGNOSIS — D649 Anemia, unspecified: Secondary | ICD-10-CM | POA: Diagnosis not present

## 2023-10-08 DIAGNOSIS — K648 Other hemorrhoids: Secondary | ICD-10-CM | POA: Diagnosis present

## 2023-10-08 DIAGNOSIS — R933 Abnormal findings on diagnostic imaging of other parts of digestive tract: Secondary | ICD-10-CM

## 2023-10-08 DIAGNOSIS — D500 Iron deficiency anemia secondary to blood loss (chronic): Secondary | ICD-10-CM

## 2023-10-08 DIAGNOSIS — K254 Chronic or unspecified gastric ulcer with hemorrhage: Secondary | ICD-10-CM | POA: Diagnosis present

## 2023-10-08 DIAGNOSIS — I129 Hypertensive chronic kidney disease with stage 1 through stage 4 chronic kidney disease, or unspecified chronic kidney disease: Secondary | ICD-10-CM | POA: Diagnosis present

## 2023-10-08 DIAGNOSIS — K7460 Unspecified cirrhosis of liver: Secondary | ICD-10-CM | POA: Diagnosis present

## 2023-10-08 DIAGNOSIS — F10139 Alcohol abuse with withdrawal, unspecified: Secondary | ICD-10-CM | POA: Diagnosis not present

## 2023-10-08 DIAGNOSIS — F1010 Alcohol abuse, uncomplicated: Secondary | ICD-10-CM | POA: Diagnosis present

## 2023-10-08 DIAGNOSIS — K31819 Angiodysplasia of stomach and duodenum without bleeding: Secondary | ICD-10-CM | POA: Diagnosis not present

## 2023-10-08 DIAGNOSIS — K760 Fatty (change of) liver, not elsewhere classified: Secondary | ICD-10-CM | POA: Diagnosis present

## 2023-10-08 DIAGNOSIS — Z87891 Personal history of nicotine dependence: Secondary | ICD-10-CM | POA: Diagnosis not present

## 2023-10-08 DIAGNOSIS — Q438 Other specified congenital malformations of intestine: Secondary | ICD-10-CM

## 2023-10-08 DIAGNOSIS — N189 Chronic kidney disease, unspecified: Secondary | ICD-10-CM | POA: Diagnosis present

## 2023-10-08 DIAGNOSIS — F10930 Alcohol use, unspecified with withdrawal, uncomplicated: Secondary | ICD-10-CM

## 2023-10-08 DIAGNOSIS — K766 Portal hypertension: Secondary | ICD-10-CM | POA: Diagnosis present

## 2023-10-08 DIAGNOSIS — D62 Acute posthemorrhagic anemia: Secondary | ICD-10-CM | POA: Diagnosis present

## 2023-10-08 DIAGNOSIS — K264 Chronic or unspecified duodenal ulcer with hemorrhage: Secondary | ICD-10-CM | POA: Diagnosis present

## 2023-10-08 DIAGNOSIS — Z841 Family history of disorders of kidney and ureter: Secondary | ICD-10-CM

## 2023-10-08 DIAGNOSIS — D689 Coagulation defect, unspecified: Secondary | ICD-10-CM | POA: Diagnosis present

## 2023-10-08 DIAGNOSIS — Z860101 Personal history of adenomatous and serrated colon polyps: Secondary | ICD-10-CM

## 2023-10-08 DIAGNOSIS — K5520 Angiodysplasia of colon without hemorrhage: Secondary | ICD-10-CM | POA: Diagnosis not present

## 2023-10-08 DIAGNOSIS — Z560 Unemployment, unspecified: Secondary | ICD-10-CM

## 2023-10-08 DIAGNOSIS — R55 Syncope and collapse: Secondary | ICD-10-CM | POA: Diagnosis present

## 2023-10-08 DIAGNOSIS — F10130 Alcohol abuse with withdrawal, uncomplicated: Secondary | ICD-10-CM | POA: Diagnosis not present

## 2023-10-08 DIAGNOSIS — D696 Thrombocytopenia, unspecified: Secondary | ICD-10-CM | POA: Diagnosis present

## 2023-10-08 DIAGNOSIS — K5521 Angiodysplasia of colon with hemorrhage: Principal | ICD-10-CM | POA: Diagnosis present

## 2023-10-08 DIAGNOSIS — I9589 Other hypotension: Secondary | ICD-10-CM | POA: Diagnosis present

## 2023-10-08 DIAGNOSIS — K921 Melena: Secondary | ICD-10-CM | POA: Diagnosis present

## 2023-10-08 DIAGNOSIS — Z811 Family history of alcohol abuse and dependence: Secondary | ICD-10-CM | POA: Diagnosis not present

## 2023-10-08 DIAGNOSIS — R000 Tachycardia, unspecified: Secondary | ICD-10-CM | POA: Diagnosis not present

## 2023-10-08 DIAGNOSIS — F10939 Alcohol use, unspecified with withdrawal, unspecified: Secondary | ICD-10-CM | POA: Insufficient documentation

## 2023-10-08 LAB — CBC WITH DIFFERENTIAL/PLATELET
Abs Immature Granulocytes: 0.07 10*3/uL (ref 0.00–0.07)
Basophils Absolute: 0.1 10*3/uL (ref 0.0–0.1)
Basophils Relative: 1 %
Eosinophils Absolute: 0 10*3/uL (ref 0.0–0.5)
Eosinophils Relative: 0 %
HCT: 20.3 % — ABNORMAL LOW (ref 39.0–52.0)
Hemoglobin: 6.1 g/dL — CL (ref 13.0–17.0)
Immature Granulocytes: 1 %
Lymphocytes Relative: 10 %
Lymphs Abs: 1 10*3/uL (ref 0.7–4.0)
MCH: 27.2 pg (ref 26.0–34.0)
MCHC: 30 g/dL (ref 30.0–36.0)
MCV: 90.6 fL (ref 80.0–100.0)
Monocytes Absolute: 1.1 10*3/uL — ABNORMAL HIGH (ref 0.1–1.0)
Monocytes Relative: 10 %
Neutro Abs: 8.3 10*3/uL — ABNORMAL HIGH (ref 1.7–7.7)
Neutrophils Relative %: 78 %
Platelets: 57 10*3/uL — ABNORMAL LOW (ref 150–400)
RBC: 2.24 MIL/uL — ABNORMAL LOW (ref 4.22–5.81)
RDW: 20 % — ABNORMAL HIGH (ref 11.5–15.5)
Smear Review: DECREASED
WBC: 10.5 10*3/uL (ref 4.0–10.5)
nRBC: 0.2 % (ref 0.0–0.2)

## 2023-10-08 LAB — COMPREHENSIVE METABOLIC PANEL WITH GFR
ALT: 30 U/L (ref 0–44)
AST: 82 U/L — ABNORMAL HIGH (ref 15–41)
Albumin: 3.2 g/dL — ABNORMAL LOW (ref 3.5–5.0)
Alkaline Phosphatase: 26 U/L — ABNORMAL LOW (ref 38–126)
Anion gap: 19 — ABNORMAL HIGH (ref 5–15)
BUN: 16 mg/dL (ref 8–23)
CO2: 16 mmol/L — ABNORMAL LOW (ref 22–32)
Calcium: 8.4 mg/dL — ABNORMAL LOW (ref 8.9–10.3)
Chloride: 101 mmol/L (ref 98–111)
Creatinine, Ser: 1.19 mg/dL (ref 0.61–1.24)
GFR, Estimated: >60 mL/min (ref >60)
Glucose, Bld: 157 mg/dL — ABNORMAL HIGH (ref 70–99)
Potassium: 3.7 mmol/L (ref 3.5–5.1)
Sodium: 136 mmol/L (ref 135–145)
Total Bilirubin: 1 mg/dL (ref 0.0–1.2)
Total Protein: 6.7 g/dL (ref 6.5–8.1)

## 2023-10-08 LAB — URINALYSIS, ROUTINE W REFLEX MICROSCOPIC
Bilirubin Urine: NEGATIVE
Glucose, UA: NEGATIVE mg/dL
Hgb urine dipstick: NEGATIVE
Ketones, ur: 20 mg/dL — AB
Leukocytes,Ua: NEGATIVE
Nitrite: NEGATIVE
Protein, ur: NEGATIVE mg/dL
Specific Gravity, Urine: >1.046 — ABNORMAL HIGH (ref 1.005–1.030)
pH: 6 (ref 5.0–8.0)

## 2023-10-08 LAB — I-STAT CHEM 8, ED
BUN: 17 mg/dL (ref 8–23)
Calcium, Ion: 1.08 mmol/L — ABNORMAL LOW (ref 1.15–1.40)
Chloride: 103 mmol/L (ref 98–111)
Creatinine, Ser: 1.1 mg/dL (ref 0.61–1.24)
Glucose, Bld: 150 mg/dL — ABNORMAL HIGH (ref 70–99)
HCT: 21 % — ABNORMAL LOW (ref 39.0–52.0)
Hemoglobin: 7.1 g/dL — ABNORMAL LOW (ref 13.0–17.0)
Potassium: 3.8 mmol/L (ref 3.5–5.1)
Sodium: 136 mmol/L (ref 135–145)
TCO2: 17 mmol/L — ABNORMAL LOW (ref 22–32)

## 2023-10-08 LAB — I-STAT CG4 LACTIC ACID, ED: Lactic Acid, Venous: 5.9 mmol/L (ref 0.5–1.9)

## 2023-10-08 LAB — LIPASE, BLOOD: Lipase: 59 U/L — ABNORMAL HIGH (ref 11–51)

## 2023-10-08 LAB — PROTIME-INR
INR: 1.3 — ABNORMAL HIGH (ref 0.8–1.2)
Prothrombin Time: 16.8 s — ABNORMAL HIGH (ref 11.4–15.2)

## 2023-10-08 LAB — PREPARE RBC (CROSSMATCH)

## 2023-10-08 LAB — LACTIC ACID, PLASMA: Lactic Acid, Venous: 1.4 mmol/L (ref 0.5–1.9)

## 2023-10-08 MED ORDER — THIAMINE MONONITRATE 100 MG PO TABS
100.0000 mg | ORAL_TABLET | Freq: Every day | ORAL | Status: DC
  Administered 2023-10-08 – 2023-10-12 (×5): 100 mg via ORAL
  Filled 2023-10-08 (×5): qty 1

## 2023-10-08 MED ORDER — LORAZEPAM 2 MG/ML IJ SOLN: 1.0000 mg | INTRAMUSCULAR | Status: AC | PRN

## 2023-10-08 MED ORDER — ONDANSETRON HCL 4 MG PO TABS: 4.0000 mg | ORAL_TABLET | Freq: Four times a day (QID) | ORAL | Status: DC | PRN

## 2023-10-08 MED ORDER — LORAZEPAM 2 MG/ML IJ SOLN
1.0000 mg | Freq: Once | INTRAMUSCULAR | Status: AC
  Administered 2023-10-08: 1 mg via INTRAVENOUS
  Filled 2023-10-08: qty 1

## 2023-10-08 MED ORDER — IOHEXOL 350 MG/ML SOLN
75.0000 mL | Freq: Once | INTRAVENOUS | Status: AC | PRN
Start: 2023-10-08 — End: 2023-10-08
  Administered 2023-10-08: 75 mL via INTRAVENOUS

## 2023-10-08 MED ORDER — SODIUM CHLORIDE 0.9 % IV SOLN
50.0000 ug/h | INTRAVENOUS | Status: DC
Start: 2023-10-08 — End: 2023-10-09
  Administered 2023-10-08 – 2023-10-09 (×3): 50 ug/h via INTRAVENOUS
  Filled 2023-10-08 (×3): qty 1

## 2023-10-08 MED ORDER — ADULT MULTIVITAMIN W/MINERALS CH
1.0000 | ORAL_TABLET | Freq: Every day | ORAL | Status: DC
  Administered 2023-10-08 – 2023-10-11 (×4): 1 via ORAL
  Filled 2023-10-08 (×4): qty 1

## 2023-10-08 MED ORDER — LORAZEPAM 1 MG PO TABS
1.0000 mg | ORAL_TABLET | ORAL | Status: AC | PRN
  Administered 2023-10-09 – 2023-10-10 (×2): 1 mg via ORAL
  Filled 2023-10-08 (×2): qty 1

## 2023-10-08 MED ORDER — OCTREOTIDE LOAD VIA INFUSION
50.0000 ug | Freq: Once | INTRAVENOUS | Status: AC
  Administered 2023-10-08: 50 ug via INTRAVENOUS
  Filled 2023-10-08: qty 25

## 2023-10-08 MED ORDER — ONDANSETRON HCL 4 MG/2ML IJ SOLN: 4.0000 mg | Freq: Four times a day (QID) | INTRAMUSCULAR | Status: DC | PRN

## 2023-10-08 MED ORDER — PANTOPRAZOLE SODIUM 40 MG IV SOLR
40.0000 mg | Freq: Two times a day (BID) | INTRAVENOUS | Status: DC
Start: 2023-10-08 — End: 2023-10-12
  Administered 2023-10-08 – 2023-10-11 (×7): 40 mg via INTRAVENOUS
  Filled 2023-10-08 (×7): qty 10

## 2023-10-08 MED ORDER — LACTATED RINGERS IV BOLUS
500.0000 mL | Freq: Once | INTRAVENOUS | Status: AC
  Administered 2023-10-08: 500 mL via INTRAVENOUS

## 2023-10-08 MED ORDER — SODIUM CHLORIDE 0.9% IV SOLUTION: Freq: Once | INTRAVENOUS | Status: DC

## 2023-10-08 MED ORDER — FOLIC ACID 1 MG PO TABS
1.0000 mg | ORAL_TABLET | Freq: Every day | ORAL | Status: DC
Start: 2023-10-08 — End: 2023-10-12
  Administered 2023-10-08 – 2023-10-12 (×5): 1 mg via ORAL
  Filled 2023-10-08 (×5): qty 1

## 2023-10-08 MED ORDER — THIAMINE HCL 100 MG/ML IJ SOLN: 100.0000 mg | Freq: Every day | INTRAMUSCULAR | Status: DC

## 2023-10-08 MED ORDER — CHLORDIAZEPOXIDE HCL 25 MG PO CAPS
25.0000 mg | ORAL_CAPSULE | Freq: Once | ORAL | Status: AC
  Administered 2023-10-08: 25 mg via ORAL
  Filled 2023-10-08: qty 1

## 2023-10-08 NOTE — H&P (Addendum)
 History and Physical    Patient: Stephen Bowers GNF:[id number] DOB: January 26, 1961 DOA: 10/08/2023 DOS: the patient was seen and examined on 10/08/2023 PCP: [name], MD  Patient coming from: Home  Chief Complaint:  Chief Complaint  Patient presents with   Loss of Consciousness   GI Bleeding   HPI: Stephen Bowers is a 63 y.o. male with medical history significant for specialization for GI bleed and alcohol withdrawal 4 months ago.  He also has severe thrombocytopenia and history of cirrhosis The patient reports black tarry stools over the last 2 days.  He also has been dizzy.  He had another black tarry bowel movement today and when he got up from the toilet he must of passed out.  His wife found him and called EMS.  He denies any abdominal pain.  He has not had any chest pain or shortness of breath or fevers. In the emergency department he was found to have a hemoglobin of 6.1.  His initial blood pressure was 96/78.  The patient ended up getting 1 unit of uncrossed match blood.  His blood pressures have been better since that time and he is completing his third unit of packed red blood cells.  The patient had severe alcohol withdrawal during his last hospital stay but ended up leaving AGAINST MEDICAL ADVICE after about 4 days.  He is agreeable to stay at this time, but is worried about missing work and his paycheck.    Review of Systems: As mentioned in the history of present illness. All other systems reviewed and are negative. Past Medical History:  Diagnosis Date   Chronic musculoskeletal pain    CKD (chronic kidney disease)    follows with [name])   High cholesterol Dx 2011   Hypertension    Pancolitis (HCC)    Substance abuse (HCC)    Wears partial dentures    upper only   Past Surgical History:  Procedure Laterality Date   BIOPSY  07/06/2021   Procedure: BIOPSY;  Surgeon: [name], MD;  Location: [facility] ENDOSCOPY;  Service: Endoscopy;;   COLONOSCOPY   12/2014   hx polyps - Pyrtle   ESOPHAGOGASTRODUODENOSCOPY (EGD) WITH PROPOFOL N/A 07/06/2021   Dr [name]. [facility] ENDOSCOPY. findings: non-bleeding Duodenal ulcer, gastric erosions. Path: mild chronic gastritis, no H Pylori.   MULTIPLE TOOTH EXTRACTIONS     Social History:  reports that he quit smoking about 30 years ago. His smoking use included cigarettes. He started smoking about 36 years ago. He has a 1.5 pack-year smoking history. He has never used smokeless tobacco. He reports current alcohol use of about 6.0 standard drinks of alcohol per week. He reports that he does not use drugs.  No Known Allergies  Family History  Problem Relation Age of Onset   Liver disease Father    Alcohol abuse Father    Diabetes Sister    Obesity Sister    Cancer Mother        breast    Kidney disease Mother    Colon cancer Neg Hx    Esophageal cancer Neg Hx    Rectal cancer Neg Hx    Stomach cancer Neg Hx    Colon polyps Neg Hx     Prior to Admission medications   Medication Sig Start Date End Date Taking? Authorizing Provider  ferrous sulfate 325 (65 FE) MG tablet Take 1 tablet (325 mg total) by mouth daily with breakfast. 06/19/23   [name],  MD  folic acid (FOLVITE) 1 MG tablet Take 1 tablet (1 mg total) by mouth daily. 06/18/23   [name], MD  pantoprazole (PROTONIX) 40 MG tablet Take 1 tablet (40 mg total) by mouth daily. 06/18/23   [name], MD  thiamine (VITAMIN B1) 100 MG tablet Take 1 tablet (100 mg total) by mouth daily. 06/18/23   [name], MD    Physical Exam: Vitals:   10/08/23 2113 10/08/23 2115 10/08/23 2128 10/08/23 2130  BP: (!) 137/99 (!) 144/86 (!) 134/99 138/87  Pulse: 81 84 91 87  Resp: 13 13 15 20   Temp: 98.9 F (37.2 C)  98.8 F (37.1 C)   TempSrc: Oral  Oral   SpO2: 100% 100% 100% 100%   Physical Exam:  General: No acute distress, chronically ill appearing HEENT: Normocephalic, atraumatic, PERRL Cardiovascular: Normal  rate and rhythm. Distal pulses intact. Pulmonary: Normal pulmonary effort, normal breath sounds Gastrointestinal: Nondistended abdomen, soft, non-tender, normoactive bowel sounds Musculoskeletal:Normal ROM, no lower ext edema Skin: Skin is warm and dry. Neuro: No focal deficits noted, AAOx3.tremor almost udetectable PSYCH: Attentive and cooperative  Data Reviewed:  Results for orders placed or performed during the hospital encounter of 10/08/23 (from the past 24 hours)  Type and screen [facility]     Status: None (Preliminary result)   Collection Time: 10/08/23  3:30 PM  Result Value Ref Range   ABO/RH(D) A POS    Antibody Screen NEG    Sample Expiration 10/11/2023,2359    Unit Number [id number]    Blood Component Type RED CELLS,LR    Unit division 00    Status of Unit ISSUED    Unit tag comment VERBAL ORDERS PER DR DICKSON    Transfusion Status OK TO TRANSFUSE    Crossmatch Result COMPATIBLE    Unit Number [id number]    Blood Component Type RED CELLS,LR    Unit division 00    Status of Unit ISSUED    Transfusion Status OK TO TRANSFUSE    Crossmatch Result      Compatible Performed at [facility], [address], [place], [place] [address]    Unit Number [id number]    Blood Component Type RED CELLS,LR    Unit division 00    Status of Unit ISSUED    Transfusion Status OK TO TRANSFUSE    Crossmatch Result Compatible   Comprehensive metabolic panel     Status: Abnormal   Collection Time: 10/08/23  3:32 PM  Result Value Ref Range   Sodium 136 135 - 145 mmol/L   Potassium 3.7 3.5 - 5.1 mmol/L   Chloride 101 98 - 111 mmol/L   CO2 16 (L) 22 - 32 mmol/L   Glucose, Bld 157 (H) 70 - 99 mg/dL   BUN 16 8 - 23 mg/dL   Creatinine, Ser 4.74 0.61 - 1.24 mg/dL   Calcium 8.4 (L) 8.9 - 10.3 mg/dL   Total Protein 6.7 6.5 - 8.1 g/dL   Albumin 3.2 (L) 3.5 - 5.0 g/dL   AST 82 (H) 15 - 41 U/L   ALT 30 0 - 44 U/L   Alkaline Phosphatase 26 (L) 38 - 126  U/L   Total Bilirubin 1.0 0.0 - 1.2 mg/dL   GFR, Estimated >25 >95 mL/min   Anion gap 19 (H) 5 - 15  Lipase, blood     Status: Abnormal   Collection Time: 10/08/23  3:32 PM  Result Value Ref Range  Lipase 59 (H) 11 - 51 U/L  CBC with Diff     Status: Abnormal   Collection Time: 10/08/23  3:32 PM  Result Value Ref Range   WBC 10.5 4.0 - 10.5 K/uL   RBC 2.24 (L) 4.22 - 5.81 MIL/uL   Hemoglobin 6.1 (LL) 13.0 - 17.0 g/dL   HCT 16.1 (L) 09.6 - 04.5 %   MCV 90.6 80.0 - 100.0 fL   MCH 27.2 26.0 - 34.0 pg   MCHC 30.0 30.0 - 36.0 g/dL   RDW 40.9 (H) 81.1 - 91.4 %   Platelets 57 (L) 150 - 400 K/uL   nRBC 0.2 0.0 - 0.2 %   Neutrophils Relative % 78 %   Neutro Abs 8.3 (H) 1.7 - 7.7 K/uL   Lymphocytes Relative 10 %   Lymphs Abs 1.0 0.7 - 4.0 K/uL   Monocytes Relative 10 %   Monocytes Absolute 1.1 (H) 0.1 - 1.0 K/uL   Eosinophils Relative 0 %   Eosinophils Absolute 0.0 0.0 - 0.5 K/uL   Basophils Relative 1 %   Basophils Absolute 0.1 0.0 - 0.1 K/uL   WBC Morphology MORPHOLOGY UNREMARKABLE    RBC Morphology See Note    Smear Review PLATELETS APPEAR DECREASED    Immature Granulocytes 1 %   Abs Immature Granulocytes 0.07 0.00 - 0.07 K/uL   Polychromasia PRESENT    Target Cells PRESENT   Protime-INR     Status: Abnormal   Collection Time: 10/08/23  3:32 PM  Result Value Ref Range   Prothrombin Time 16.8 (H) 11.4 - 15.2 seconds   INR 1.3 (H) 0.8 - 1.2  Prepare RBC     Status: None   Collection Time: 10/08/23  3:34 PM  Result Value Ref Range   Order Confirmation      ORDER PROCESSED BY BLOOD BANK Performed at [facility], [address] [place], [place] [address]   I-Stat Chem 8, ED     Status: Abnormal   Collection Time: 10/08/23  3:47 PM  Result Value Ref Range   Sodium 136 135 - 145 mmol/L   Potassium 3.8 3.5 - 5.1 mmol/L   Chloride 103 98 - 111 mmol/L   BUN 17 8 - 23 mg/dL   Creatinine, Ser 6.21 0.61 - 1.24 mg/dL   Glucose, Bld 308 (H) 70 - 99 mg/dL   Calcium, Ion  6.57 (L) 1.15 - 1.40 mmol/L   TCO2 17 (L) 22 - 32 mmol/L   Hemoglobin 7.1 (L) 13.0 - 17.0 g/dL   HCT 84.6 (L) 96.2 - 95.2 %  I-Stat Lactic Acid, ED     Status: Abnormal   Collection Time: 10/08/23  3:49 PM  Result Value Ref Range   Lactic Acid, Venous 5.9 (HH) 0.5 - 1.9 mmol/L   Comment NOTIFIED PHYSICIAN   Prepare RBC (crossmatch)     Status: None   Collection Time: 10/08/23  5:00 PM  Result Value Ref Range   Order Confirmation      ORDER PROCESSED BY BLOOD BANK Performed at [facility], [address] [place], [place] [address]   Urinalysis, Routine w reflex microscopic -Urine, Clean Catch     Status: Abnormal   Collection Time: 10/08/23  8:28 PM  Result Value Ref Range   Color, Urine YELLOW YELLOW   APPearance CLEAR CLEAR   Specific Gravity, Urine >1.046 (H) 1.005 - 1.030   pH 6.0 5.0 - 8.0   Glucose, UA NEGATIVE NEGATIVE mg/dL  Hgb urine dipstick NEGATIVE NEGATIVE   Bilirubin Urine NEGATIVE NEGATIVE   Ketones, ur 20 (A) NEGATIVE mg/dL   Protein, ur NEGATIVE NEGATIVE mg/dL   Nitrite NEGATIVE NEGATIVE   Leukocytes,Ua NEGATIVE NEGATIVE     Assessment and Plan: # GI Bleed / Acute blood loss anemia / thrombocytopenia  - 3 units packed red blood cells ordered - IV Protonix - [facility] consulted - N.p.o. - Octreotide drip - Transfuse platelets if <20  - Patient agrees to stay at least through Wednesday  # Hypotension -resolved after transfusion  # Alcohol withdrawal -patient had severe DTs requiring Precedex and phenobarbital in addition to benzos during his last hospitalization.  He says he drinks only on weekends now which is much less than he did before. - CIWA protocol - Thiamine, folate, - Follow-up magnesium and potassium levels    Advance Care Planning:   Code Status: Full Code the patient named his wife as a Runner, broadcasting/film/video and wants to be full code  Consults: [facility]  Family Communication: None  Severity of Illness: The appropriate  patient status for this patient is INPATIENT. Inpatient status is judged to be reasonable and necessary in order to provide the required intensity of service to ensure the patient's safety. The patient's presenting symptoms, physical exam findings, and initial radiographic and laboratory data in the context of their chronic comorbidities is felt to place them at high risk for further clinical deterioration. Furthermore, it is not anticipated that the patient will be medically stable for discharge from the hospital within 2 midnights of admission.   * I certify that at the point of admission it is my clinical judgment that the patient will require inpatient hospital care spanning beyond 2 midnights from the point of admission due to high intensity of service, high risk for further deterioration and high frequency of surveillance required.*  Author: Buena Irish, MD 10/08/2023 10:24 PM  For on call review [email or web address].

## 2023-10-08 NOTE — ED Provider Notes (Signed)
 [place] EMERGENCY DEPARTMENT AT [facility] Provider Note   CSN: [id number] Arrival date & time: 10/08/23  1523     History  Chief Complaint  Patient presents with   Loss of Consciousness   GI Bleeding    Stephen Bowers is a 63 y.o. male.  HPI Patient presents for hematochezia. Medical history includes HTN, HLD, alcohol abuse, cirrhosis, anemia, prediabetes.  In December, he was admitted with symptomatic anemia secondary to GI bleed.  His hemoglobin at the time was 4.6.  He was transfused 3 units PRBCs.  CTA showed small focus of endoluminal arterial contrast blush at the cecal base.  GI and interventional radiology were involved.  Conservative management was pursued.  He had severe alcohol withdrawal while admitted, requiring Precedex and phenobarbital.  He required additional transfusions of PRBCs as well as platelets.  GI bleeding seemed to resolve while in the hospital.  Prior to completing his phenobarbital taper, he left AMA.  Patient does continue to drink but states that he typically does not drink every day.  His last alcoholic beverage was 2 days ago.  Starting yesterday, he had nonbloody emesis with bloody stools.  He estimates 3 episodes of large-volume bloody stools.  During his episode this afternoon, he had a syncopal episode while on the toilet.  Duration of LOC was approximately 3 minutes.  EMS noted patient to be hypotensive with SBP in the 70s.  This improved with 450 cc of IVF given prior to arrival.  Patient currently denies any areas of pain.  He states that he does have fatigue, generalized weakness, and lightheadedness.    Home Medications Prior to Admission medications   Medication Sig Start Date End Date Taking? Authorizing Provider  ferrous sulfate 325 (65 FE) MG tablet Take 1 tablet (325 mg total) by mouth daily with breakfast. 06/19/23   [name], MD  folic acid (FOLVITE) 1 MG tablet Take 1 tablet (1 mg total) by mouth daily. 06/18/23    [name], MD  pantoprazole (PROTONIX) 40 MG tablet Take 1 tablet (40 mg total) by mouth daily. 06/18/23   [name], MD  thiamine (VITAMIN B1) 100 MG tablet Take 1 tablet (100 mg total) by mouth daily. 06/18/23   [name], MD      Allergies    Patient has no known allergies.    Review of Systems   Review of Systems  Constitutional:  Positive for fatigue.  Gastrointestinal:  Positive for blood in stool, nausea and vomiting.  Neurological:  Positive for syncope, weakness (Generalized) and light-headedness.  All other systems reviewed and are negative.   Physical Exam Updated Vital Signs BP 138/87   Pulse 87   Temp 98.8 F (37.1 C) (Oral)   Resp 20   SpO2 100%  Physical Exam Vitals and nursing note reviewed. Exam conducted with a chaperone present.  Constitutional:      General: He is not in acute distress.    Appearance: Normal appearance. He is well-developed. He is ill-appearing. He is not toxic-appearing or diaphoretic.  HENT:     Head: Normocephalic and atraumatic.     Right Ear: External ear normal.     Left Ear: External ear normal.     Nose: Nose normal.     Mouth/Throat:     Mouth: Mucous membranes are moist.  Eyes:     Extraocular Movements: Extraocular movements intact.     Conjunctiva/sclera: Conjunctivae normal.  Cardiovascular:  Rate and Rhythm: Tachycardia present. Rhythm irregular.     Heart sounds: No murmur heard. Pulmonary:     Effort: Pulmonary effort is normal. No respiratory distress.     Breath sounds: Normal breath sounds. No wheezing or rales.  Chest:     Chest wall: No tenderness.  Abdominal:     General: There is no distension.     Palpations: Abdomen is soft.     Tenderness: There is no abdominal tenderness.  Genitourinary:    Comments: Presence of maroon blood around rectum.  No active bleeding. Musculoskeletal:        General: No swelling.     Cervical back: Normal range of motion and neck supple.   Skin:    General: Skin is warm and dry.     Coloration: Skin is not jaundiced or pale.  Neurological:     General: No focal deficit present.     Mental Status: He is alert and oriented to person, place, and time.     Comments: Mildly tremulous  Psychiatric:        Mood and Affect: Mood normal.        Behavior: Behavior normal.     ED Results / Procedures / Treatments   Labs (all labs ordered are listed, but only abnormal results are displayed) Labs Reviewed  COMPREHENSIVE METABOLIC PANEL WITH GFR - Abnormal; Notable for the following components:      Result Value   CO2 16 (*)    Glucose, Bld 157 (*)    Calcium 8.4 (*)    Albumin 3.2 (*)    AST 82 (*)    Alkaline Phosphatase 26 (*)    Anion gap 19 (*)    All other components within normal limits  LIPASE, BLOOD - Abnormal; Notable for the following components:   Lipase 59 (*)    All other components within normal limits  CBC WITH DIFFERENTIAL/PLATELET - Abnormal; Notable for the following components:   RBC 2.24 (*)    Hemoglobin 6.1 (*)    HCT 20.3 (*)    RDW 20.0 (*)    Platelets 57 (*)    Neutro Abs 8.3 (*)    Monocytes Absolute 1.1 (*)    All other components within normal limits  URINALYSIS, ROUTINE W REFLEX MICROSCOPIC - Abnormal; Notable for the following components:   Specific Gravity, Urine >1.046 (*)    Ketones, ur 20 (*)    All other components within normal limits  PROTIME-INR - Abnormal; Notable for the following components:   Prothrombin Time 16.8 (*)    INR 1.3 (*)    All other components within normal limits  I-STAT CHEM 8, ED - Abnormal; Notable for the following components:   Glucose, Bld 150 (*)    Calcium, Ion 1.08 (*)    TCO2 17 (*)    Hemoglobin 7.1 (*)    HCT 21.0 (*)    All other components within normal limits  I-STAT CG4 LACTIC ACID, ED - Abnormal; Notable for the following components:   Lactic Acid, Venous 5.9 (*)    All other components within normal limits  MAGNESIUM  LACTIC ACID,  PLASMA  TYPE AND SCREEN  PREPARE RBC (CROSSMATCH)  PREPARE RBC (CROSSMATCH)    EKG EKG Interpretation Date/Time:  Monday October 08 2023 15:28:06 EDT Ventricular Rate:  124 PR Interval:  131 QRS Duration:  76 QT Interval:  332 QTC Calculation: 477 R Axis:   50  Text Interpretation: Sinus tachycardia Borderline T abnormalities,  inferior leads Borderline prolonged QT interval Confirmed by [name] [phone]) on 10/08/2023 4:10:19 PM  Radiology CT Angio Abd/Pel W and/or Wo Contrast Result Date: 10/08/2023 CLINICAL DATA:  Lower GI bleed EXAM: CTA ABDOMEN AND PELVIS WITHOUT AND WITH CONTRAST TECHNIQUE: Multidetector CT imaging of the abdomen and pelvis was performed using the standard protocol during bolus administration of intravenous contrast. Multiplanar reconstructed images and MIPs were obtained and reviewed to evaluate the vascular anatomy. RADIATION DOSE REDUCTION: This exam was performed according to the departmental dose-optimization program which includes automated exposure control, adjustment of the mA and/or kV according to patient size and/or use of iterative reconstruction technique. CONTRAST:  75mL OMNIPAQUE IOHEXOL 350 MG/ML SOLN COMPARISON:  CT 06/06/2023 FINDINGS: VASCULAR Aorta: Normal caliber aorta without aneurysm, dissection, vasculitis or significant stenosis. Moderate aortic atherosclerosis. Celiac: Patent without evidence of aneurysm, dissection, vasculitis or significant stenosis. SMA: Patent without evidence of aneurysm, dissection, vasculitis or significant stenosis. Renals: 2 left and single right renal arteries are patent without evidence of aneurysm, dissection, vasculitis, fibromuscular dysplasia or significant stenosis. IMA: Patent without evidence of aneurysm, dissection, vasculitis or significant stenosis. Inflow: Patent without evidence of aneurysm, dissection, vasculitis or significant stenosis. Proximal Outflow: Bilateral common femoral and visualized portions of the  superficial and profunda femoral arteries are patent without evidence of aneurysm, dissection, vasculitis or significant stenosis. Veins: Patent portal and splenic veins. Review of the MIP images confirms the above findings. NON-VASCULAR Lower chest: Lung bases are clear Hepatobiliary: Hepatic steatosis. No calcified gallstone or biliary dilatation Pancreas: Possible mild stranding at the pancreatic head and uncinate process. No ductal dilatation Spleen: Normal in size without focal abnormality. Adrenals/Urinary Tract: Adrenal glands are normal. No hydronephrosis. Nonspecific perinephric fat stranding. Stable subcentimeter hypodense focus with punctate calcification at the mid left kidney for which no specific imaging follow-up is recommended. Bladder is normal Stomach/Bowel: Hyperdense material in the cecum, ascending and transverse colon. No definite intraluminal extravasation of contrast to suggest active GI bleeding. Negative appendix. No bowel wall thickening Lymphatic: No suspicious lymph nodes Reproductive: Negative prostate Other: Negative for free air or pelvic effusion Musculoskeletal: No acute or suspicious osseous abnormality IMPRESSION: 1. Negative for acute aortic dissection or aneurysm. No significant stenosis or occlusive disease. 2. Negative for active GI bleeding on this exam. Hyperdense intraluminal material, most evident at the cecum, ascending and transverse colon, nonspecific but in the setting of lower GI bleed, could represent blood product/hemorrhagic material. Correlation with tagged RBC scan could be considered for further assessment. 3. Hepatic steatosis. 4. Possible mild stranding at the pancreatic head and uncinate process, correlate with serum enzymes for acute pancreatitis. 5. Aortic atherosclerosis. Electronically Signed   By: Jasmine Pang M.D.   On: 10/08/2023 21:20    Procedures Procedures    Medications Ordered in ED Medications  0.9 %  sodium chloride infusion (Manually  program via Guardrails IV Fluids) (has no administration in time range)  pantoprazole (PROTONIX) injection 40 mg (40 mg Intravenous Given 10/08/23 1706)  0.9 %  sodium chloride infusion (Manually program via Guardrails IV Fluids) (has no administration in time range)  octreotide (SANDOSTATIN) 2 mcg/mL load via infusion 50 mcg (50 mcg Intravenous Bolus from Bag 10/08/23 1814)    And  octreotide (SANDOSTATIN) 500 mcg in sodium chloride 0.9 % 250 mL (2 mcg/mL) infusion (50 mcg/hr Intravenous New Bag/Given 10/08/23 1809)  LORazepam (ATIVAN) tablet 1-4 mg (has no administration in time range)    Or  LORazepam (ATIVAN) injection 1-4 mg (has no administration  in time range)  thiamine (VITAMIN B1) tablet 100 mg (has no administration in time range)    Or  thiamine (VITAMIN B1) injection 100 mg (has no administration in time range)  folic acid (FOLVITE) tablet 1 mg (has no administration in time range)  multivitamin with minerals tablet 1 tablet (has no administration in time range)  chlordiazePOXIDE (LIBRIUM) capsule 25 mg (25 mg Oral Given 10/08/23 1544)  LORazepam (ATIVAN) injection 1 mg (1 mg Intravenous Given 10/08/23 1544)  lactated ringers bolus 500 mL (0 mLs Intravenous Stopped 10/08/23 1711)  iohexol (OMNIPAQUE) 350 MG/ML injection 75 mL (75 mLs Intravenous Contrast Given 10/08/23 1842)    ED Course/ Medical Decision Making/ A&P                                 Medical Decision Making Amount and/or Complexity of Data Reviewed Labs: ordered. Radiology: ordered.  Risk OTC drugs. Prescription drug management. Decision regarding hospitalization.   This patient presents to the ED for concern of blood in stool, this involves an extensive number of treatment options, and is a complaint that carries with it a high risk of complications and morbidity.  The differential diagnosis includes diverticular bleed, internal hemorrhoids, colitis, upper GI bleed with rapid transit   Co morbidities that  complicate the patient evaluation  HTN, HLD, alcohol abuse, cirrhosis, anemia, prediabetes   Additional history obtained:  Additional history obtained from EMS External records from outside source obtained and reviewed including EMR   Lab Tests:  I Ordered, and personally interpreted labs.  The pertinent results include: Acute on chronic anemia, thrombocytopenia, anion gap metabolic acidosis and lactic acidosis consistent with chronic alcohol abuse   Imaging Studies ordered:  I ordered imaging studies including CTA of abdomen and pelvis I independently visualized and interpreted imaging which showed no active extravasation.  There is hyperdense material throughout colon, consistent with possible blood products.  Mild stranding at the pancreatic head and uncinate process. I agree with the radiologist interpretation   Cardiac Monitoring: / EKG:  The patient was maintained on a cardiac monitor.  I personally viewed and interpreted the cardiac monitored which showed an underlying rhythm of: Sinus rhythm   Problem List / ED Course / Critical interventions / Medication management  Patient presenting for syncopal episode in the setting of hematochezia for the past 2 days.  Patient describes large-volume blood loss at home since yesterday.  EMS noted hypotension and tachycardia.  On arrival, patient's blood pressure remained soft with SBP in the 90s.  He is tachycardic in the 110s.  There is concern of hemorrhagic shock.  1 unit PRBCs was ordered emergency release.  On exam, patient's abdomen is soft and nontender.  He has maroon blood present in gluteal cleft.  No active bleeding is identified.  He is found to be mildly tremulous.  Last alcoholic drink was 2 days ago.  Will treat empirically for alcohol withdrawal with Librium and Ativan.  Workup was initiated.  Lab work confirms acute on chronic anemia.  Hemoglobin today was 6.1.  Additional PRBCs were ordered.  Patient's lactate was found to  be elevated.  I suspect this is secondary to his chronic alcohol abuse.  I spoke with gastroenterologist on-call, Dr. Meridee Score, who agrees with Protonix and recommends initiation of octreotide.  [facility] will see in consult.  On CTA, there was no evidence of active extravasation.  Patient's vital signs normalized following PRBCs  and IV fluids.  CIWA protocol was ordered for ongoing surveillance of alcohol withdrawal.  Patient was admitted for further management. I ordered medication including PRBCs for symptomatic anemia; Librium and Ativan for alcohol withdrawal; Protonix and octreotide for empiric treatment of UGIB Reevaluation of the patient after these medicines showed that the patient improved I have reviewed the patients home medicines and have made adjustments as needed   Consultations Obtained:  I requested consultation with the gastroenterologist, Dr. Meridee Score,  and discussed lab and imaging findings as well as pertinent plan - they recommend: Protonix, octreotide, GI will see in consult   Social Determinants of Health:  Ongoing alcohol abuse  CRITICAL CARE Performed by: [name]   Total critical care time: 32 minutes  Critical care time was exclusive of separately billable procedures and treating other patients.  Critical care was necessary to treat or prevent imminent or life-threatening deterioration.  Critical care was time spent personally by me on the following activities: development of treatment plan with patient and/or surrogate as well as nursing, discussions with consultants, evaluation of patient's response to treatment, examination of patient, obtaining history from patient or surrogate, ordering and performing treatments and interventions, ordering and review of laboratory studies, ordering and review of radiographic studies, pulse oximetry and re-evaluation of patient's condition.         Final Clinical Impression(s) / ED Diagnoses Final diagnoses:   Syncope and collapse  Symptomatic anemia  Hematochezia    Rx / DC Orders ED Discharge Orders     None         [name], MD 10/08/23 2206

## 2023-10-08 NOTE — ED Triage Notes (Signed)
 Pt bib ems from home after syncopal episode while straining to have a BM. Pt endorses blood in stool since yesterday. Pt denies blood thinners or head/neck/back pain. Pt also with some vomiting, denies blood in vomit. Initial BP 70/40, after 450cc NS en route, BP improved to 126/80.

## 2023-10-09 ENCOUNTER — Other Ambulatory Visit: Payer: Self-pay

## 2023-10-09 ENCOUNTER — Inpatient Hospital Stay (HOSPITAL_COMMUNITY): Payer: Self-pay

## 2023-10-09 DIAGNOSIS — D62 Acute posthemorrhagic anemia: Secondary | ICD-10-CM | POA: Diagnosis not present

## 2023-10-09 DIAGNOSIS — K922 Gastrointestinal hemorrhage, unspecified: Secondary | ICD-10-CM | POA: Diagnosis not present

## 2023-10-09 DIAGNOSIS — K921 Melena: Secondary | ICD-10-CM | POA: Diagnosis not present

## 2023-10-09 DIAGNOSIS — R933 Abnormal findings on diagnostic imaging of other parts of digestive tract: Secondary | ICD-10-CM

## 2023-10-09 DIAGNOSIS — K760 Fatty (change of) liver, not elsewhere classified: Secondary | ICD-10-CM | POA: Diagnosis not present

## 2023-10-09 LAB — FERRITIN: Ferritin: 34 ng/mL (ref 24–336)

## 2023-10-09 LAB — VITAMIN B12: Vitamin B-12: 551 pg/mL (ref 180–914)

## 2023-10-09 LAB — BASIC METABOLIC PANEL WITH GFR
Anion gap: 13 (ref 5–15)
BUN: 13 mg/dL (ref 8–23)
CO2: 21 mmol/L — ABNORMAL LOW (ref 22–32)
Calcium: 8.4 mg/dL — ABNORMAL LOW (ref 8.9–10.3)
Chloride: 100 mmol/L (ref 98–111)
Creatinine, Ser: 0.97 mg/dL (ref 0.61–1.24)
GFR, Estimated: >60 mL/min (ref >60)
Glucose, Bld: 164 mg/dL — ABNORMAL HIGH (ref 70–99)
Potassium: 4 mmol/L (ref 3.5–5.1)
Sodium: 134 mmol/L — ABNORMAL LOW (ref 135–145)

## 2023-10-09 LAB — DIFFERENTIAL
Abs Immature Granulocytes: 0.04 10*3/uL (ref 0.00–0.07)
Basophils Absolute: 0.1 10*3/uL (ref 0.0–0.1)
Basophils Relative: 1 %
Eosinophils Absolute: 0.1 10*3/uL (ref 0.0–0.5)
Eosinophils Relative: 1 %
Immature Granulocytes: 1 %
Lymphocytes Relative: 17 %
Lymphs Abs: 1.4 10*3/uL (ref 0.7–4.0)
Monocytes Absolute: 1 10*3/uL (ref 0.1–1.0)
Monocytes Relative: 12 %
Neutro Abs: 5.9 10*3/uL (ref 1.7–7.7)
Neutrophils Relative %: 68 %
Smear Review: DECREASED

## 2023-10-09 LAB — CBC
HCT: 26.6 % — ABNORMAL LOW (ref 39.0–52.0)
HCT: 26.7 % — ABNORMAL LOW (ref 39.0–52.0)
Hemoglobin: 8.7 g/dL — ABNORMAL LOW (ref 13.0–17.0)
Hemoglobin: 8.8 g/dL — ABNORMAL LOW (ref 13.0–17.0)
MCH: 27.4 pg (ref 26.0–34.0)
MCH: 27.9 pg (ref 26.0–34.0)
MCHC: 32.7 g/dL (ref 30.0–36.0)
MCHC: 33 g/dL (ref 30.0–36.0)
MCV: 83.7 fL (ref 80.0–100.0)
MCV: 84.8 fL (ref 80.0–100.0)
Platelets: 41 10*3/uL — ABNORMAL LOW (ref 150–400)
Platelets: 39 10*3/uL — ABNORMAL LOW (ref 150–400)
RBC: 3.17 MIL/uL — ABNORMAL LOW (ref 4.22–5.81)
RBC: 3.15 MIL/uL — ABNORMAL LOW (ref 4.22–5.81)
RDW: 18.8 % — ABNORMAL HIGH (ref 11.5–15.5)
RDW: 19 % — ABNORMAL HIGH (ref 11.5–15.5)
WBC: 8 10*3/uL (ref 4.0–10.5)
WBC: 8.5 10*3/uL (ref 4.0–10.5)
nRBC: 0 % (ref 0.0–0.2)
nRBC: 0.2 % (ref 0.0–0.2)

## 2023-10-09 LAB — HEMOGLOBIN AND HEMATOCRIT, BLOOD
HCT: 27.1 % — ABNORMAL LOW (ref 39.0–52.0)
HCT: 25 % — ABNORMAL LOW (ref 39.0–52.0)
HCT: 23 % — ABNORMAL LOW (ref 39.0–52.0)
Hemoglobin: 8.8 g/dL — ABNORMAL LOW (ref 13.0–17.0)
Hemoglobin: 8.4 g/dL — ABNORMAL LOW (ref 13.0–17.0)
Hemoglobin: 7.6 g/dL — ABNORMAL LOW (ref 13.0–17.0)

## 2023-10-09 LAB — FOLATE: Folate: >40 ng/mL (ref >5.9)

## 2023-10-09 LAB — MAGNESIUM: Magnesium: 1.7 mg/dL (ref 1.7–2.4)

## 2023-10-09 LAB — MRSA NEXT GEN BY PCR, NASAL: MRSA by PCR Next Gen: NOT DETECTED

## 2023-10-09 LAB — GAMMA GT: GGT: 222 U/L — ABNORMAL HIGH (ref 7–50)

## 2023-10-09 MED ORDER — MAGNESIUM SULFATE 2 GM/50ML IV SOLN
2.0000 g | Freq: Once | INTRAVENOUS | Status: AC
  Administered 2023-10-09: 2 g via INTRAVENOUS
  Filled 2023-10-09: qty 50

## 2023-10-09 NOTE — Consult Note (Signed)
 [facility]  [phone] [phone]    HEMATOLOGY CONSULTATION  PURPOSE OF CONSULTATION/CHIEF COMPLAINT: Thrombocytopenia/Rec GI bleed/Coagulopathy  Referring MD:  Dr. Sharlene Dory   HPI: Mr. Stephen Bowers is a 63 year old male patient who came to the ED on 10/08/2023 due to complaints of loss of consciousness with GI bleeding.  Patient reported that he had black tarry stools x 2 days beginning 4/6 and last episode early morning 10/08/23 and was feeling dizzy.  He states he stood up from the toilet and thinks that was when he passed out.  Apparently patient's wife found him and called EMS. Workup was done in the ED including lab work at which time patient found to have low hemoglobin and low platelets.  Hematology consult has therefore been requested. Patient is seen, assessed and examined today. Reports history as stated above.  Denies chest pain, abdominal pain, other active GI bleeding. Denies current lightheadedness or dizziness.  Medical history significant for recurrent GI bleed, anemia, thrombocytopenia, and alcohol use disorder. Surgical history is noncontributory, includes endoscopy and colonoscopy. Family history includes father with liver disease and alcohol abuse. Social history is significant for alcohol use, states he has cut down significantly and only drinks two to four beers on Fridays and Saturdays.  Admits to 6-year tobacco use, quit over 30 years ago. Denies recreational and illicit drug use.  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     ASSESSMENT AND PLAN:  Recurrent GI bleed History of previous GI bleed History of duodenal ulcer - CT angio abdomen and pelvis done 10/08/23 negative for active GI bleeding. However hyperdense material in cecum, ascending and transverse colon, nonspecific but in setting of lower GI bleed, could represent blood product/hemorrhagic material.   - Patient seen December 2024 with same.  At that  time CT angio done 06/06/2023 showed small focus of endoluminal arterial contrast blush in the cecal base likely source of bleed.  Seen by IR however no embolization at that time.  Labs at that time were negative for HCV, HIV, DAT. - Seen in outpatient hematology office by Dr. Pamelia Hoit on 06/21/2023.  CBC showed stable hemoglobin 9.6 and platelets 500 38K at that time.  Patient was advised to return to outpatient hematology on an as-needed basis. - GI following. Now on clear liquid diet.  - Recommend repeat endoscopy and colonoscopy.   - Patient agrees to stay until all work-up is completed.  - Hematology/Dr. Cherly Hensen following for this admission.  Thrombocytopenia Coagulopathy - Unclear etiology.  Patient has no history of cirrhosis however he does have a history of alcohol use. - recommend U/S liver - Platelet counts are low 41K - Transfuse platelets for counts <20 K or <50 K with active bleeding or prior to procedures. - Peripheral smear review pending - Continue to monitor CBC with differential  Anemia - Hemoglobin low 6.1 on admission 10/08/2023.  Status post PRBC transfusions. - Hemoglobin currently 8.7 today, no transfusion or intervention indicated at this time. - Transfuse PRBC for hemoglobin <7 0.0-7.5 or symptomatic anemia - Iron studies pending, replete as needed.  - Continue to monitor CBC with differential  Alcohol use disorder - History of excessive drinking - Continue CIWA protocol -Encourage continued reduction or cessation to prevent further GI issues and recurrent bleeding     Past Medical History:  Diagnosis Date   Chronic musculoskeletal pain    CKD (chronic kidney disease)    follows with [name])   High cholesterol  Dx 2011   Hypertension    Pancolitis (HCC)    Substance abuse (HCC)    Wears partial dentures    upper only  :  Past Surgical History:  Procedure Laterality Date   BIOPSY  07/06/2021   Procedure: BIOPSY;  Surgeon: [name],  MD;  Location: [facility] ENDOSCOPY;  Service: Endoscopy;;   COLONOSCOPY  12/2014   hx polyps - Pyrtle   ESOPHAGOGASTRODUODENOSCOPY (EGD) WITH PROPOFOL N/A 07/06/2021   Dr [name]. [facility] ENDOSCOPY. findings: non-bleeding Duodenal ulcer, gastric erosions. Path: mild chronic gastritis, no H Pylori.   MULTIPLE TOOTH EXTRACTIONS    :  No Known Allergies:   Family History  Problem Relation Age of Onset   Liver disease Father    Alcohol abuse Father    Diabetes Sister    Obesity Sister    Cancer Mother        breast    Kidney disease Mother    Colon cancer Neg Hx    Esophageal cancer Neg Hx    Rectal cancer Neg Hx    Stomach cancer Neg Hx    Colon polyps Neg Hx   :   Social History   Socioeconomic History   Marital status: Married    Spouse name: Elnita Maxwell   Number of children: 1    Years of education: 12    Highest education level: Not on file  Occupational History   Occupation: Unemployed   Tobacco Use   Smoking status: Former    Current packs/day: 0.00    Average packs/day: 0.3 packs/day for 6.0 years (1.5 ttl pk-yrs)    Types: Cigarettes    Start date: 67    Quit date: 1995    Years since quitting: 30.2   Smokeless tobacco: Never  Vaping Use   Vaping status: Never Used  Substance and Sexual Activity   Alcohol use: Yes    Alcohol/week: 6.0 standard drinks of alcohol    Types: 6 Cans of beer per week    Comment: 6 drinks Beer /Liquor   Drug use: No   Sexual activity: Yes  Other Topics Concern   Not on file  Social History Narrative   Lives in an [place], family house.    Child goes to [employer] in [place], 23 yo F,          Social Drivers of Health   Financial Resource Strain: Low Risk  (05/15/2023)   Overall Financial Resource Strain (CARDIA)    Difficulty of Paying Living Expenses: Not hard at all  Food Insecurity: No Food Insecurity (10/09/2023)   Hunger Vital Sign    Worried About Running Out of Food in the Last Year: Never true    Ran Out of Food in the  Last Year: Never true  Transportation Needs: No Transportation Needs (10/09/2023)   PRAPARE - Administrator, Civil Service (Medical): No    Lack of Transportation (Non-Medical): No  Physical Activity: Sufficiently Active (05/15/2023)   Exercise Vital Sign    Days of Exercise per Week: 5 days    Minutes of Exercise per Session: 30 min  Stress: No Stress Concern Present (05/15/2023)   [employer] of Occupational Health - Occupational Stress Questionnaire    Feeling of Stress : Not at all  Social Connections: Moderately Isolated (05/15/2023)   Social Connection and Isolation Panel [NHANES]    Frequency of Communication with Friends and Family: Once a week    Frequency of Social Gatherings with Friends and  Family: Once a week    Attends Religious Services: Never    Active Member of Clubs or Organizations: Yes    Attends Banker Meetings: Never    Marital Status: Married  Catering manager Violence: Not At Risk (10/09/2023)   Humiliation, Afraid, Rape, and Kick questionnaire    Fear of Current or Ex-Partner: No    Emotionally Abused: No    Physically Abused: No    Sexually Abused: No  :   CURRENT MEDS: Current Facility-Administered Medications  Medication Dose Route Frequency Provider Last Rate Last Admin   0.9 %  sodium chloride infusion (Manually program via Guardrails IV Fluids)   Intravenous Once [name], MD       0.9 %  sodium chloride infusion (Manually program via Guardrails IV Fluids)   Intravenous Once [name], MD       folic acid (FOLVITE) tablet 1 mg  1 mg Oral Daily [name], MD   1 mg at 10/09/23 1017   LORazepam (ATIVAN) tablet 1-4 mg  1-4 mg Oral Q1H PRN [name], MD       Or   LORazepam (ATIVAN) injection 1-4 mg  1-4 mg Intravenous Q1H PRN [name], MD       multivitamin with minerals tablet 1 tablet  1 tablet Oral Daily [name], MD   1 tablet at 10/09/23 1017   octreotide (SANDOSTATIN) 500 mcg in sodium chloride 0.9 %  250 mL (2 mcg/mL) infusion  50 mcg/hr Intravenous Continuous [name], MD 25 mL/hr at 10/09/23 0343 50 mcg/hr at 10/09/23 0343   ondansetron (ZOFRAN) tablet 4 mg  4 mg Oral Q6H PRN Buena Irish, MD       Or   ondansetron (ZOFRAN) injection 4 mg  4 mg Intravenous Q6H PRN Buena Irish, MD       pantoprazole (PROTONIX) injection 40 mg  40 mg Intravenous Q12H [name], MD   40 mg at 10/09/23 1017   thiamine (VITAMIN B1) tablet 100 mg  100 mg Oral Daily [name], MD   100 mg at 10/09/23 1017   Or   thiamine (VITAMIN B1) injection 100 mg  100 mg Intravenous Daily [name], MD       Current Outpatient Medications  Medication Sig Dispense Refill   diclofenac Sodium (VOLTAREN) 1 % GEL Apply 4 g topically in the morning and at bedtime. Apply to bilateral knees     docusate sodium (COLACE) 100 MG capsule Take 100 mg by mouth 2 (two) times a week. Take on Monday and Wednesday     ferrous sulfate 325 (65 FE) MG tablet Take 1 tablet (325 mg total) by mouth daily with breakfast. 100 tablet 0   fexofenadine (ALLEGRA) 180 MG tablet Take 180 mg by mouth daily.     folic acid (FOLVITE) 1 MG tablet Take 1 tablet (1 mg total) by mouth daily. 90 tablet 1   ibuprofen (ADVIL) 200 MG tablet Take 200 mg by mouth every 6 (six) hours as needed for mild pain (pain score 1-3) or headache.     Misc Natural Products (ELDERBERRY/VITAMIN C/ZINC) CHEW Chew 1 Dose by mouth daily.     Multiple Vitamin (MULTIVITAMIN WITH MINERALS) TABS tablet Take 1 tablet by mouth daily.     pantoprazole (PROTONIX) 40 MG tablet Take 1 tablet (40 mg total) by mouth daily. 30 tablet 3   thiamine (VITAMIN B1) 100 MG tablet Take 1 tablet (100 mg total) by mouth daily. 100 tablet 1  REVIEW OF SYSTEMS:   Constitutional: Denies fevers, chills or abnormal night sweats Eyes: Denies blurriness of vision, double vision or watery eyes Ears, nose, mouth, throat, and face: Denies mucositis or sore throat Respiratory: Denies cough,  dyspnea or wheezes Cardiovascular: Denies palpitation, chest discomfort or lower extremity swelling Gastrointestinal: +black tarry stools 4/6 and 4/7, none today Skin: Denies abnormal skin rashes Lymphatics: Denies new lymphadenopathy or easy bruising Neurological: Denies numbness, tingling or new weaknesses Behavioral/Psych: Mood is stable, no new changes  All other systems were reviewed with the patient and are negative.  PHYSICAL EXAMINATION: ECOG PERFORMANCE STATUS: 3 - Symptomatic, >50% confined to bed  Vitals:   10/09/23 1000 10/09/23 1100  BP: 119/78 111/84  Pulse: 85 78  Resp: 19 18  Temp:    SpO2: 95% 97%   Filed Weights   10/09/23 0743  Weight: 189 lb 9.5 oz (86 kg)    GENERAL: alert, no distress and comfortable SKIN: skin color, texture, turgor are normal, no rashes or significant lesions EYES: normal, conjunctiva are pink and non-injected, sclera clear OROPHARYNX: no exudate, no erythema and lips, buccal mucosa, and tongue normal  NECK: supple, thyroid normal size, non-tender, without nodularity LYMPH: no palpable lymphadenopathy in the cervical, axillary or inguinal LUNGS: clear to auscultation and percussion with normal breathing effort HEART: regular rate & rhythm and no murmurs and no lower extremity edema ABDOMEN: abdomen soft, non-tender and normal bowel sounds MUSCULOSKELETAL: no cyanosis of digits and no clubbing  PSYCH: alert & oriented x 3 with fluent speech NEURO: no focal motor/sensory deficits   LABS: Lab Results  Component Value Date   WBC 8.0 10/09/2023   HGB 8.7 (L) 10/09/2023   HCT 26.6 (L) 10/09/2023   MCV 83.7 10/09/2023   PLT 41 (L) 10/09/2023    Lab Results  Component Value Date   WBC 8.0 10/09/2023   HGB 8.7 (L) 10/09/2023   HCT 26.6 (L) 10/09/2023   PLT 41 (L) 10/09/2023   GLUCOSE 164 (H) 10/09/2023   CHOL 293 (H) 04/12/2020   TRIG 82 05/22/2022   HDL 95 04/12/2020   LDLCALC 181 (H) 04/12/2020   ALT 30 10/08/2023   AST 82  (H) 10/08/2023   NA 134 (L) 10/09/2023   K 4.0 10/09/2023   CL 100 10/09/2023   CREATININE 0.97 10/09/2023   BUN 13 10/09/2023   CO2 21 (L) 10/09/2023   INR 1.3 (H) 10/08/2023   HGBA1C 5.9 (H) 07/06/2021    CT Angio Abd/Pel W and/or Wo Contrast Result Date: 10/08/2023 CLINICAL DATA:  Lower GI bleed EXAM: CTA ABDOMEN AND PELVIS WITHOUT AND WITH CONTRAST TECHNIQUE: Multidetector CT imaging of the abdomen and pelvis was performed using the standard protocol during bolus administration of intravenous contrast. Multiplanar reconstructed images and MIPs were obtained and reviewed to evaluate the vascular anatomy. RADIATION DOSE REDUCTION: This exam was performed according to the departmental dose-optimization program which includes automated exposure control, adjustment of the mA and/or kV according to patient size and/or use of iterative reconstruction technique. CONTRAST:  75mL OMNIPAQUE IOHEXOL 350 MG/ML SOLN COMPARISON:  CT 06/06/2023 FINDINGS: VASCULAR Aorta: Normal caliber aorta without aneurysm, dissection, vasculitis or significant stenosis. Moderate aortic atherosclerosis. Celiac: Patent without evidence of aneurysm, dissection, vasculitis or significant stenosis. SMA: Patent without evidence of aneurysm, dissection, vasculitis or significant stenosis. Renals: 2 left and single right renal arteries are patent without evidence of aneurysm, dissection, vasculitis, fibromuscular dysplasia or significant stenosis. IMA: Patent without evidence of aneurysm, dissection, vasculitis or significant  stenosis. Inflow: Patent without evidence of aneurysm, dissection, vasculitis or significant stenosis. Proximal Outflow: Bilateral common femoral and visualized portions of the superficial and profunda femoral arteries are patent without evidence of aneurysm, dissection, vasculitis or significant stenosis. Veins: Patent portal and splenic veins. Review of the MIP images confirms the above findings. NON-VASCULAR Lower  chest: Lung bases are clear Hepatobiliary: Hepatic steatosis. No calcified gallstone or biliary dilatation Pancreas: Possible mild stranding at the pancreatic head and uncinate process. No ductal dilatation Spleen: Normal in size without focal abnormality. Adrenals/Urinary Tract: Adrenal glands are normal. No hydronephrosis. Nonspecific perinephric fat stranding. Stable subcentimeter hypodense focus with punctate calcification at the mid left kidney for which no specific imaging follow-up is recommended. Bladder is normal Stomach/Bowel: Hyperdense material in the cecum, ascending and transverse colon. No definite intraluminal extravasation of contrast to suggest active GI bleeding. Negative appendix. No bowel wall thickening Lymphatic: No suspicious lymph nodes Reproductive: Negative prostate Other: Negative for free air or pelvic effusion Musculoskeletal: No acute or suspicious osseous abnormality IMPRESSION: 1. Negative for acute aortic dissection or aneurysm. No significant stenosis or occlusive disease. 2. Negative for active GI bleeding on this exam. Hyperdense intraluminal material, most evident at the cecum, ascending and transverse colon, nonspecific but in the setting of lower GI bleed, could represent blood product/hemorrhagic material. Correlation with tagged RBC scan could be considered for further assessment. 3. Hepatic steatosis. 4. Possible mild stranding at the pancreatic head and uncinate process, correlate with serum enzymes for acute pancreatitis. 5. Aortic atherosclerosis. Electronically Signed   By: Jasmine Pang M.D.   On: 10/08/2023 21:20     The total time spent in the appointment was 55 minutes encounter with patients including review of chart and various tests results, discussions about plan of care and coordination of care plan   All questions were answered. The patient knows to call the clinic with any problems, questions or concerns. No barriers to learning was detected.  Thank  you for the courtesy of this consultation, Dawson Bills, NP  4/8/202512:44 PM

## 2023-10-09 NOTE — Consult Note (Addendum)
 Consultation  Referring Provider: [facility] Primary Care Physician:  Stephen Matar, MD Primary Gastroenterologist:  Dr. Rhea Bowers  Reason for Consultation: Acute GI bleed/major with syncope  HPI: Stephen Bowers is a 63 y.o. male known to the GI service, who presented to the emergency room last p.m. after he had a syncopal episode at home in the bathroom after a bloody bowel movement. Patient says that he had onset of symptoms on Sunday initially with nausea and vomiting (nonbloody) then passage of stool with dark red blood mixed in.  He says on Monday he had 2-3 episodes of more grossly bloody bowel movements, then the syncopal episode.  He has not had any associated abdominal pain. He denies any aspirin or NSAID use though he does have Advil listed on his med list he says he cannot take any of those things. Patient also has history of EtOH use disorder chronic, hypertension, chronic kidney disease, adenomatous colon polyps and prior history of GI bleeding including a bleed in 2023 secondary to a duodenal ulcer. On presentation yesterday WBC 10.5/hemoglobin 6.1/hematocrit 21.3/platelets 57 Pro time 16.8/INR 1.3 BUN 16/creatinine 1.19 Lactate 5.9 T. bili 1.0/AST 82/alk phos 26.  CT angio of the abdomen and pelvis was done last p.m. with finding of hepatic steatosis, no evidence of cirrhosis or portal hypertension, no definite extravasation to suggest acute bleeding but there was a lot of hyperdense material in the colon-including the cecum/ascending and transverse colon suggestive of blood-no bowel wall thickening.  He was transfused 2 units of packed RBCs and hemoglobin up to 8.7 this morning/hematocrit 26.6 and platelets 41,000.  He says he feels much better today he has not had any further bowel movements overnight.  He is asking about eating.  Patient says he is cut way back on his alcohol use and is now only drinking on the weekends but is not specific as to the amount.  He  says he knows he needs to completely stop. He is worried about how long he will need to be in the hospital as he is missing work.  Last EGD in January 2023 done for iron deficiency anemia and heme positive stool showed 1 nonbleeding duodenal ulcer normal-appearing esophagus and gastric erosions. Last colonoscopy 2019 per Dr. Rhea Bowers done for history of adenomatous polyps with finding of 3 sessile polyps and internal hemorrhoids.  No diverticuli mentioned. Path showed hyperplastic polyps with indication to follow-up in 5 years.  Patient had an admission in December 2024 with very similar presentation of acute onset of maroon stools, and had a hemoglobin in the 4 range with that admission and platelets of 13,000.  CTA at that time was positive for extravasation in the cecum, IR embolization was contemplated but this was not done due to his profound thrombocytopenia and mild coagulopathy.  He was treated with transfusions, and ultimately the bleeding stopped.  He was also seen by hematology during that admission-and thoughts at that time were that his thrombocytopenia was secondary to alcohol.  He did require platelet transfusion during that admission.  He has not been seen by hematology since.   Past Medical History:  Diagnosis Date   Chronic musculoskeletal pain    CKD (chronic kidney disease)    follows with [facility] Bowers)   High cholesterol Dx 2011   Hypertension    Pancolitis (HCC)    Substance abuse (HCC)    Wears partial dentures    upper only    Past Surgical History:  Procedure Laterality Date  BIOPSY  07/06/2021   Procedure: BIOPSY;  Surgeon: [name], MD;  [facility] ENDOSCOPY;  Service: Endoscopy;;   COLONOSCOPY  12/2014   hx polyps - Pyrtle   ESOPHAGOGASTRODUODENOSCOPY (EGD) WITH PROPOFOL N/A 07/06/2021   Dr [name]. [facility] ENDOSCOPY. findings: non-bleeding Duodenal ulcer, gastric erosions. Path: mild chronic gastritis, no H Pylori.   MULTIPLE TOOTH  EXTRACTIONS      Prior to Admission medications   Medication Sig Start Date End Date Taking? Authorizing Provider  diclofenac Sodium (VOLTAREN) 1 % GEL Apply 4 g topically in the morning and at bedtime. Apply to bilateral knees   Yes [provider]  docusate sodium (COLACE) 100 MG capsule Take 100 mg by mouth 2 (two) times a week. Take on Monday and Wednesday   Yes [provider]  ferrous sulfate 325 (65 FE) MG tablet Take 1 tablet (325 mg total) by mouth daily with breakfast. 06/19/23  Yes Stephen Matar, MD  fexofenadine (ALLEGRA) 180 MG tablet Take 180 mg by mouth daily.   Yes [provider]  folic acid (FOLVITE) 1 MG tablet Take 1 tablet (1 mg total) by mouth daily. 06/18/23  Yes Stephen Matar, MD  ibuprofen (ADVIL) 200 MG tablet Take 200 mg by mouth every 6 (six) hours as needed for mild pain (pain score 1-3) or headache.   Yes [provider]  Misc Natural Products (ELDERBERRY/VITAMIN C/ZINC) CHEW Chew 1 Dose by mouth daily.   Yes [provider]  Multiple Vitamin (MULTIVITAMIN WITH MINERALS) TABS tablet Take 1 tablet by mouth daily.   Yes [provider]  pantoprazole (PROTONIX) 40 MG tablet Take 1 tablet (40 mg total) by mouth daily. 06/18/23  Yes Stephen Matar, MD  thiamine (VITAMIN B1) 100 MG tablet Take 1 tablet (100 mg total) by mouth daily. 06/18/23  Yes Stephen Matar, MD    Current Facility-Administered Medications  Medication Dose Route Frequency Provider Last Rate Last Admin   0.9 %  sodium chloride infusion (Manually program via Guardrails IV Fluids)   Intravenous Once [name], MD       0.9 %  sodium chloride infusion (Manually program via Guardrails IV Fluids)   Intravenous Once [name], MD       folic acid (FOLVITE) tablet 1 mg  1 mg Oral Daily [name], MD   1 mg at 10/08/23 2225   LORazepam (ATIVAN) tablet 1-4 mg  1-4 mg Oral Q1H PRN [name], MD       Or   LORazepam (ATIVAN)  injection 1-4 mg  1-4 mg Intravenous Q1H PRN [name], MD       multivitamin with minerals tablet 1 tablet  1 tablet Oral Daily [name], MD   1 tablet at 10/08/23 2225   octreotide (SANDOSTATIN) 500 mcg in sodium chloride 0.9 % 250 mL (2 mcg/mL) infusion  50 mcg/hr Intravenous Continuous [name], MD 25 mL/hr at 10/09/23 0343 50 mcg/hr at 10/09/23 0343   ondansetron (ZOFRAN) tablet 4 mg  4 mg Oral Q6H PRN Buena Irish, MD       Or   ondansetron [facility]) injection 4 mg  4 mg Intravenous Q6H PRN Buena Irish, MD       pantoprazole (PROTONIX) injection 40 mg  40 mg Intravenous Q12H [name], MD   40 mg at 10/08/23 1706   thiamine (VITAMIN B1) tablet 100 mg  100 mg Oral Daily [name], MD   100 mg at 10/08/23 2225  Or   thiamine (VITAMIN B1) injection 100 mg  100 mg Intravenous Daily [name], MD       Current Outpatient Medications  Medication Sig Dispense Refill   diclofenac Sodium (VOLTAREN) 1 % GEL Apply 4 g topically in the morning and at bedtime. Apply to bilateral knees     docusate sodium (COLACE) 100 MG capsule Take 100 mg by mouth 2 (two) times a week. Take on Monday and Wednesday     ferrous sulfate 325 (65 FE) MG tablet Take 1 tablet (325 mg total) by mouth daily with breakfast. 100 tablet 0   fexofenadine (ALLEGRA) 180 MG tablet Take 180 mg by mouth daily.     folic acid (FOLVITE) 1 MG tablet Take 1 tablet (1 mg total) by mouth daily. 90 tablet 1   ibuprofen (ADVIL) 200 MG tablet Take 200 mg by mouth every 6 (six) hours as needed for mild pain (pain score 1-3) or headache.     Misc Natural Products (ELDERBERRY/VITAMIN C/ZINC) CHEW Chew 1 Dose by mouth daily.     Multiple Vitamin (MULTIVITAMIN WITH MINERALS) TABS tablet Take 1 tablet by mouth daily.     pantoprazole (PROTONIX) 40 MG tablet Take 1 tablet (40 mg total) by mouth daily. 30 tablet 3   thiamine (VITAMIN B1) 100 MG tablet Take 1 tablet (100 mg total) by mouth daily. 100 tablet 1    Allergies  as of 10/08/2023   (No Known Allergies)    Family History  Problem Relation Age of Onset   Liver disease Father    Alcohol abuse Father    Diabetes Sister    Obesity Sister    Cancer Mother        breast    Kidney disease Mother    Colon cancer Neg Hx    Esophageal cancer Neg Hx    Rectal cancer Neg Hx    Stomach cancer Neg Hx    Colon polyps Neg Hx     Social History   Socioeconomic History   Marital status: Married    Spouse name: Elnita Maxwell   Number of children: 1    Years of education: 12    Highest education level: Not on file  Occupational History   Occupation: Unemployed   Tobacco Use   Smoking status: Former    Current packs/day: 0.00    Average packs/day: 0.3 packs/day for 6.0 years (1.5 ttl pk-yrs)    Types: Cigarettes    Start date: 10    Quit date: 1995    Years since quitting: 30.2   Smokeless tobacco: Never  Vaping Use   Vaping status: Never Used  Substance and Sexual Activity   Alcohol use: Yes    Alcohol/week: 6.0 standard drinks of alcohol    Types: 6 Cans of beer per week    Comment: 6 drinks Beer /Liquor   Drug use: No   Sexual activity: Yes  Other Topics Concern   Not on file  Social History Narrative   Lives in an [place], family house.    Child goes to [employer] in [place], 89 yo F,          Social Drivers of Health   Financial Resource Strain: Low Risk  (05/15/2023)   Overall Financial Resource Strain (CARDIA)    Difficulty of Paying Living Expenses: Not hard at all  Food Insecurity: No Food Insecurity (06/06/2023)   Hunger Vital Sign    Worried About Running Out of Food in the Last Year: Never  true    Ran Out of Food in the Last Year: Never true  Transportation Needs: No Transportation Needs (06/06/2023)   PRAPARE - Administrator, Civil Service (Medical): No    Lack of Transportation (Non-Medical): No  Physical Activity: Sufficiently Active (05/15/2023)   Exercise Vital Sign    Days of Exercise per Week: 5 days     Minutes of Exercise per Session: 30 min  Stress: No Stress Concern Present (05/15/2023)   [employer] of Occupational Health - Occupational Stress Questionnaire    Feeling of Stress : Not at all  Social Connections: Moderately Isolated (05/15/2023)   Social Connection and Isolation Panel [NHANES]    Frequency of Communication with Friends and Family: Once a week    Frequency of Social Gatherings with Friends and Family: Once a week    Attends Religious Services: Never    Database administrator or Organizations: Yes    Attends Banker Meetings: Never    Marital Status: Married  Catering manager Violence: Not At Risk (06/06/2023)   Humiliation, Afraid, Rape, and Kick questionnaire    Fear of Current or Ex-Partner: No    Emotionally Abused: No    Physically Abused: No    Sexually Abused: No    Review of Systems: Pertinent positive and negative review of systems were noted in the above HPI section.  All other review of systems was otherwise negative.   Physical Exam: Vital signs in last 24 hours: Temp:  [98 F (36.7 C)-100.4 F (38 C)] 98.6 F (37 C) (04/08 0748) Pulse Rate:  [38-114] 109 (04/08 0900) Resp:  [12-26] 26 (04/08 0900) BP: (96-166)/(59-107) 149/87 (04/08 0900) SpO2:  [89 %-100 %] 97 % (04/08 0900) Weight:  [86 kg] 86 kg (04/08 0743)   General:   Alert,  Well-developed, well-nourished,older AA male pleasant and cooperative in NAD sitting up on side of bed- mentating well Head:  Normocephalic and atraumatic. Eyes:  Sclera clear, no icterus.   Conjunctiva pale  Ears:  Normal auditory acuity. Nose:  No deformity, discharge,  or lesions. Mouth:  No deformity or lesions.   Neck:  Supple; no masses or thyromegaly. Lungs:  Clear throughout to auscultation.   No wheezes, crackles, or rhonchi.  Heart:  tachy Regular rate and rhythm; no murmurs, clicks, rubs,  or gallops. Abdomen:  Soft,nontender, BS active,nonpalp mass or hsm.   Rectal:  not done-maroon  blood noted at rectum per ER MD Msk:  Symmetrical without gross deformities. . Pulses:  Normal pulses noted. Extremities:  Without clubbing or edema. Neurologic:  Alert and  oriented x4;  grossly normal neurologically-no tremor, mentation intact helps with the ibuprofen Skin:  Intact without significant lesions or rashes.. Psych:  Alert and cooperative. Normal mood and affect.  Intake/Output from previous day: 04/07 0701 - 04/08 0700 In: -  Out: 350 [Urine:350] Intake/Output this shift: No intake/output data recorded.  Lab Results: Recent Labs    10/08/23 1532 10/08/23 1547 10/09/23 0412  WBC 10.5  --  8.0  HGB 6.1* 7.1* 8.7*  HCT 20.3* 21.0* 26.6*  PLT 57*  --  41*   BMET Recent Labs    10/08/23 1532 10/08/23 1547 10/09/23 0412  NA 136 136 134*  K 3.7 3.8 4.0  CL 101 103 100  CO2 16*  --  21*  GLUCOSE 157* 150* 164*  BUN 16 17 13   CREATININE 1.19 1.10 0.97  CALCIUM 8.4*  --  8.4*   LFT  Recent Labs    10/08/23 1532  PROT 6.7  ALBUMIN 3.2*  AST 82*  ALT 30  ALKPHOS 26*  BILITOT 1.0   PT/INR Recent Labs    10/08/23 1532  LABPROT 16.8*  INR 1.3*   Hepatitis Panel No results for input(s): "HEPBSAG", "HCVAB", "HEPAIGM", "HEPBIGM" in the last 72 hours.    IMPRESSION:   #45 63 year old African-American male with EtOH use disorder without evidence for cirrhosis or portal hypertension who presents with acute GI bleed onset Sunday, 10/07/2023 with dark bloody bowel movements.  Patient had several episodes yesterday culminating in a syncopal episode at home and was brought to the ER. No associated abdominal pain, he did have an episode of nausea and vomiting on Sunday at outset but this was nonbloody. Hemoglobin 6.1 on presentation platelets 57,000 pro time 16.8/INR 1.3.  CTA was done last night that shows hepatic steatosis, and no definite extravasation to suggest ongoing bleeding but he has a lot of hyperdense material in the colon noted in the cecum and  ascending colon and transverse colon suggestive of blood.  Patient has been transfused 2 units of packed RBCs, is stable this morning with hemoglobin 8.7/platelets 41,000.  He says he feels a lot better and has not had any further bleeding overnight  He had a similar presentation in December 2024 for which she was hospitalized and did not undergo colonoscopy at that time or embolization even though he had a positive CTA at that time suggestive of active bleeding from the cecum because he had severe thrombocytopenia with initial platelet count of 13,000.  He did require platelet transfusion during that admission, he was seen by hematology who felt that thrombocytopenia was being driven by EtOH.  As this patient does not have any evidence of cirrhosis or portal hypertension, need further hematology evaluation to look for other hematologic disorder contributing to his significant thrombocytopenia and mild coagulopathy.  This bleed is very likely secondary to a lower GI bleed with source in the cecum as was the case in December 2024. Question AVM or other mucosal lesion  #2 anemia acute on chronic secondary to above #3 chronic kidney disease #4 history of hypertension #5 prior history of duodenal ulcer at EGD 2023 #6 history of adenomatous and hyperplastic colon polyps last colonoscopy 2019  Plan; Clear liquid diet today Hematology consultation Serial hemoglobins every 6 hours and transfuse for hemoglobin 7 or less Hopefully he can have a colonoscopy this admission, possibly tomorrow based on his platelet counts and hematology recommendations. Plans were discussed in detail with patient at bedside and he understands.  I strongly recommended that he stay put in the hospital this time until we complete adequate workup. GI will follow with you   Amy EsterwoodPA-C  10/09/2023, 9:24 AM  I have taken an interval history, thoroughly reviewed the chart and examined the patient. I agree with the Advanced  Practitioner's note, impression and recommendations, and have recorded additional findings, impressions and recommendations below. I performed a substantive portion of this encounter (>50% time spent), including a complete performance of the medical decision making.  My additional thoughts are as follows:  Cecal/proximal ascending colon bleeding of an as yet unknown source, though suspected to be an AVM.  Would be less likely for polyp/mass to behave like this, though perhaps it could in the setting of thrombocytopenia.  He does need a colonoscopy during this admission, but I feel that we need hematology evaluation first for better understanding of his thrombocytopenia.  While  he does have a history of heavy alcohol use, multiple CTs of the abdomen and pelvis have not shown morphologic changes of cirrhosis or any evidence of portal hypertension. Hematology consultation during the December hospitalization for GI bleeding felt this was likely due to liver disease, was not from DIC and ITP was doubted. However, my opinion is there is no convincing evidence that this patient has cirrhosis and he does not have reported splenomegaly or intra-abdominal collateral vessels to suggest portal hypertension. Consideration of ITP seems in order, though I understand that is likely to be a diagnosis of exclusion.  Depending on the outcome of the hematology evaluation, we will then plan the timing and prep for his colonoscopy including any necessary platelet transfusion.  If there is an underlying condition contributing to his thrombocytopenia that is amenable to particular treatment leading to improvement in his platelet count, that would be ideal because it would greatly decrease the risk of any necessary endoscopic intervention for what ever is found during colonoscopy.  He is anxious to be discharged soon as possible, and I have made it clear that he needs a diagnosis for this bleeding source prior to discharge if  he is willing to stay that long. Lastly, I have discontinued the octreotide that was started last evening after phone consultation with our covering physician.  This does not appear to be a variceal bleed.   [name] Office:[phone]

## 2023-10-09 NOTE — ED Notes (Signed)
 Patients heart monitor showing increased heart rate, EKG taken and MD [facility] notified. See new orders

## 2023-10-09 NOTE — Progress Notes (Signed)
  Progress Note   Patient: Stephen Bowers:[id number] DOB: April 12, 1961 DOA: 10/08/2023     1 DOS: the patient was seen and examined on 10/09/2023   Brief hospital course:  63 y.o. male with medical history significant for specialization for GI bleed and alcohol withdrawal 4 months ago.  He also has severe thrombocytopenia and history of cirrhosis The patient reports black tarry stools over the last 2 days.   Assessment and Plan:  Acute blood loss anemia - Hemoglobin 6.1 on presentation, status post 2 units PRBCs.  Hemoglobin 8.7 this morning.  Will continue to monitor for bleeding, recheck hemoglobin.  Acute GI bleed - Etiology unclear at this time, unknown if upper or lower.  Currently on PPI twice daily, octreotide.  GI following closely.  Will likely pursue endoscopies.  Concern for patient's coagulopathy/thrombocytopenia.  Will consult hematology.  Likely upper/lower endoscopies pending per GI.  Coagulopathy and thrombocytopenia - Etiology unclear.  Likely contributing to patient's anemia.  No history of cirrhosis however does have alcohol abuse and history of withdrawal.  Evaluation by hematology at previous hospitalization suggested likely secondary to alcohol use.  Will continue to monitor CBC, transfuse if platelets less than 20 since actively bleeding.  Alcohol abuse with withdrawal - History of severe DTs requiring Precedex and phenobarb at last hospitalization.  Patient admits that he drinks much less than he did before.  Continues on CIWA protocol.  Vitamin supplementation on board.  Monitor closely.  Hypotension - Resolved after fluid/blood resuscitation.      Subjective: Patient sitting up at the bedside this morning.  States he feels much better after receiving blood.  Denies any bowel movements, dark stools.  No shortness of breath, chest pain, nausea, vomiting, abdominal pain.  Physical Exam: Vitals:   10/09/23 0800 10/09/23 0845 10/09/23 0900 10/09/23 1000  BP: (!)  166/101  (!) 149/87 119/78  Pulse: (!) 107 95 (!) 109 85  Resp: 19 (!) 21 (!) 26 19  Temp:      TempSrc:      SpO2: 100% 97% 97% 95%  Weight:      Height:       GENERAL:  Alert, pleasant, no acute distress  HEENT:  EOMI CARDIOVASCULAR:  RRR, no murmurs appreciated RESPIRATORY:  Clear to auscultation, no wheezing, rales, or rhonchi GASTROINTESTINAL:  Soft, nontender, nondistended EXTREMITIES:  No LE edema bilaterally NEURO:  No new focal deficits appreciated SKIN:  No rashes noted PSYCH:  Appropriate mood and affect   Data Reviewed:  There are no new results to review at this time.  Family Communication: None at bedside  Disposition: Status is: Inpatient Remains inpatient appropriate because: GI bleed with anemia  Planned Discharge Destination: Home    Time spent: 36 minutes  Author: Deanna Artis, DO 10/09/2023 10:55 AM  For on call review [email or web address].

## 2023-10-10 ENCOUNTER — Inpatient Hospital Stay (HOSPITAL_COMMUNITY): Payer: PRIVATE HEALTH INSURANCE

## 2023-10-10 DIAGNOSIS — F1010 Alcohol abuse, uncomplicated: Secondary | ICD-10-CM | POA: Diagnosis not present

## 2023-10-10 DIAGNOSIS — F10130 Alcohol abuse with withdrawal, uncomplicated: Secondary | ICD-10-CM

## 2023-10-10 DIAGNOSIS — D649 Anemia, unspecified: Secondary | ICD-10-CM

## 2023-10-10 DIAGNOSIS — D62 Acute posthemorrhagic anemia: Secondary | ICD-10-CM | POA: Diagnosis not present

## 2023-10-10 DIAGNOSIS — R933 Abnormal findings on diagnostic imaging of other parts of digestive tract: Secondary | ICD-10-CM

## 2023-10-10 DIAGNOSIS — D696 Thrombocytopenia, unspecified: Secondary | ICD-10-CM

## 2023-10-10 DIAGNOSIS — E861 Hypovolemia: Secondary | ICD-10-CM

## 2023-10-10 DIAGNOSIS — K760 Fatty (change of) liver, not elsewhere classified: Secondary | ICD-10-CM | POA: Diagnosis not present

## 2023-10-10 DIAGNOSIS — K922 Gastrointestinal hemorrhage, unspecified: Secondary | ICD-10-CM | POA: Diagnosis not present

## 2023-10-10 DIAGNOSIS — K921 Melena: Secondary | ICD-10-CM | POA: Diagnosis not present

## 2023-10-10 LAB — HEMOGLOBIN AND HEMATOCRIT, BLOOD
HCT: 22.7 % — ABNORMAL LOW (ref 39.0–52.0)
HCT: 26.2 % — ABNORMAL LOW (ref 39.0–52.0)
HCT: 22.7 % — ABNORMAL LOW (ref 39.0–52.0)
Hemoglobin: 7.6 g/dL — ABNORMAL LOW (ref 13.0–17.0)
Hemoglobin: 8.6 g/dL — ABNORMAL LOW (ref 13.0–17.0)
Hemoglobin: 7.5 g/dL — ABNORMAL LOW (ref 13.0–17.0)

## 2023-10-10 LAB — CBC WITH DIFFERENTIAL/PLATELET
Abs Immature Granulocytes: 0.04 10*3/uL (ref 0.00–0.07)
Basophils Absolute: 0.1 10*3/uL (ref 0.0–0.1)
Basophils Relative: 1 %
Eosinophils Absolute: 0.1 10*3/uL (ref 0.0–0.5)
Eosinophils Relative: 1 %
HCT: 24.8 % — ABNORMAL LOW (ref 39.0–52.0)
Hemoglobin: 8.3 g/dL — ABNORMAL LOW (ref 13.0–17.0)
Immature Granulocytes: 1 %
Lymphocytes Relative: 13 %
Lymphs Abs: 1.1 10*3/uL (ref 0.7–4.0)
MCH: 27.9 pg (ref 26.0–34.0)
MCHC: 33.5 g/dL (ref 30.0–36.0)
MCV: 83.5 fL (ref 80.0–100.0)
Monocytes Absolute: 0.9 10*3/uL (ref 0.1–1.0)
Monocytes Relative: 11 %
Neutro Abs: 5.8 10*3/uL (ref 1.7–7.7)
Neutrophils Relative %: 73 %
Platelets: 58 10*3/uL — ABNORMAL LOW (ref 150–400)
RBC: 2.97 MIL/uL — ABNORMAL LOW (ref 4.22–5.81)
RDW: 18.6 % — ABNORMAL HIGH (ref 11.5–15.5)
WBC: 7.9 10*3/uL (ref 4.0–10.5)
nRBC: 0 % (ref 0.0–0.2)

## 2023-10-10 LAB — COMPREHENSIVE METABOLIC PANEL WITH GFR
ALT: 25 U/L (ref 0–44)
AST: 79 U/L — ABNORMAL HIGH (ref 15–41)
Albumin: 3.2 g/dL — ABNORMAL LOW (ref 3.5–5.0)
Alkaline Phosphatase: 22 U/L — ABNORMAL LOW (ref 38–126)
Anion gap: 10 (ref 5–15)
BUN: 8 mg/dL (ref 8–23)
CO2: 22 mmol/L (ref 22–32)
Calcium: 8.5 mg/dL — ABNORMAL LOW (ref 8.9–10.3)
Chloride: 100 mmol/L (ref 98–111)
Creatinine, Ser: 0.94 mg/dL (ref 0.61–1.24)
GFR, Estimated: >60 mL/min (ref >60)
Glucose, Bld: 182 mg/dL — ABNORMAL HIGH (ref 70–99)
Potassium: 3.6 mmol/L (ref 3.5–5.1)
Sodium: 132 mmol/L — ABNORMAL LOW (ref 135–145)
Total Bilirubin: 1 mg/dL (ref 0.0–1.2)
Total Protein: 6.4 g/dL — ABNORMAL LOW (ref 6.5–8.1)

## 2023-10-10 LAB — MAGNESIUM: Magnesium: 1.8 mg/dL (ref 1.7–2.4)

## 2023-10-10 MED ORDER — HYDROXYZINE HCL 25 MG PO TABS: 25.0000 mg | ORAL_TABLET | Freq: Four times a day (QID) | ORAL | Status: DC | PRN

## 2023-10-10 MED ORDER — BISACODYL 5 MG PO TBEC
10.0000 mg | DELAYED_RELEASE_TABLET | Freq: Once | ORAL | Status: AC
  Administered 2023-10-10: 10 mg via ORAL
  Filled 2023-10-10: qty 2

## 2023-10-10 MED ORDER — NA SULFATE-K SULFATE-MG SULF 17.5-3.13-1.6 GM/177ML PO SOLN: 0.5000 | Freq: Once | ORAL | Status: DC

## 2023-10-10 MED ORDER — CHLORDIAZEPOXIDE HCL 25 MG PO CAPS
25.0000 mg | ORAL_CAPSULE | Freq: Four times a day (QID) | ORAL | Status: AC
  Administered 2023-10-10 (×4): 25 mg via ORAL
  Filled 2023-10-10 (×4): qty 1

## 2023-10-10 MED ORDER — CHLORDIAZEPOXIDE HCL 25 MG PO CAPS
25.0000 mg | ORAL_CAPSULE | Freq: Three times a day (TID) | ORAL | Status: AC
  Administered 2023-10-11 (×2): 25 mg via ORAL
  Filled 2023-10-10 (×2): qty 1

## 2023-10-10 MED ORDER — CHLORDIAZEPOXIDE HCL 25 MG PO CAPS: 25.0000 mg | ORAL_CAPSULE | Freq: Every day | ORAL | Status: DC

## 2023-10-10 MED ORDER — CHLORDIAZEPOXIDE HCL 25 MG PO CAPS
25.0000 mg | ORAL_CAPSULE | ORAL | Status: DC
  Administered 2023-10-12: 25 mg via ORAL
  Filled 2023-10-10: qty 1

## 2023-10-10 MED ORDER — NA SULFATE-K SULFATE-MG SULF 17.5-3.13-1.6 GM/177ML PO SOLN
0.5000 | Freq: Once | ORAL | Status: DC
  Filled 2023-10-10: qty 1

## 2023-10-10 MED ORDER — LOPERAMIDE HCL 2 MG PO CAPS: 2.0000 mg | ORAL_CAPSULE | ORAL | Status: DC | PRN

## 2023-10-10 MED ORDER — CHLORDIAZEPOXIDE HCL 25 MG PO CAPS: 25.0000 mg | ORAL_CAPSULE | Freq: Four times a day (QID) | ORAL | Status: DC | PRN

## 2023-10-10 MED ORDER — THIAMINE HCL 100 MG/ML IJ SOLN: 100.0000 mg | Freq: Once | INTRAMUSCULAR | Status: AC

## 2023-10-10 MED ORDER — ADULT MULTIVITAMIN W/MINERALS CH
1.0000 | ORAL_TABLET | Freq: Every day | ORAL | Status: DC
  Administered 2023-10-12: 1 via ORAL
  Filled 2023-10-10 (×2): qty 1

## 2023-10-10 MED ORDER — PEG 3350-KCL-NA BICARB-NACL 420 G PO SOLR
4000.0000 mL | Freq: Once | ORAL | Status: AC
  Administered 2023-10-10: 4000 mL via ORAL
  Filled 2023-10-10: qty 4000

## 2023-10-10 NOTE — TOC CM/SW Note (Signed)
 Transition of Care [facility]) - Inpatient Brief Assessment   Patient Details  Name: Stephen Bowers MRN: [id number] Date of Birth: 1961-03-14  [facility] CM/SW Contact:    Harriet Masson, RN Phone Number: 10/10/2023, 2:25 PM   Clinical Narrative:  Patient lives with wife and works everyday.  Patient has market Conservation officer, nature.  Patient is scheduled for colonoscopy tomorrow.  No TOC needs at this time.   Transition of Care Asessment: Insurance and Status: Insurance coverage has been reviewed Patient has primary care physician: Yes Home environment has been reviewed: safe to discharge home Prior level of function:: independent Prior/Current Home Services: No current home services Social Drivers of Health Review: SDOH reviewed no interventions necessary Readmission risk has been reviewed: Yes Transition of care needs: no transition of care needs at this time

## 2023-10-10 NOTE — H&P (View-Only) (Signed)
 Patient ID: Stephen Bowers, male   DOB: [birth year], 63 y.o.   MRN: [id number]    Progress Note   Subjective   Day # 2 CC; acute major GI bleed with syncope  Patient says his stools are now pinkish-brown looking this morning, no obvious grossly bloody stools overnight.  No complaints of abdominal pain, no nausea or vomiting, tolerating clear liquids  Labs this a.m. hemoglobin 7.6/hematocrit 22.7-platelets pending Ferritin 34 Abdominal ultrasound ordered per hematology, no evidence of cirrhosis, hepatic steatosis present, portal vein patent   Objective   Vital signs in last 24 hours: Temp:  [98.3 F (36.8 C)-99.3 F (37.4 C)] 98.9 F (37.2 C) (04/09 0800) Pulse Rate:  [66-147] 116 (04/09 0800) Resp:  [15-23] 20 (04/09 0800) BP: (105-141)/(62-101) 123/62 (04/09 0800) SpO2:  [90 %-100 %] 98 % (04/09 0400) Weight:  [82.9 kg] 82.9 kg (04/08 1708) Last BM Date : 10/09/23 General:    Older African-American male in NAD Heart: Tachy regular rate and rhythm; no murmurs Lungs: Respirations even and unlabored, lungs CTA bilaterally Abdomen:  Soft, nontender and nondistended. Normal bowel sounds. Extremities:  Without edema. Neurologic:  Alert and oriented,  grossly normal neurologically. Psych:  Cooperative. Normal mood and affect.  Intake/Output from previous day: 04/08 0701 - 04/09 0700 In: 760.8 [P.O.:240; I.V.:518.4; IV Piggyback:2.4] Out: 375 [Urine:375] Intake/Output this shift: No intake/output data recorded.  Lab Results: Recent Labs    10/08/23 1532 10/08/23 1547 10/09/23 0412 10/09/23 1245 10/09/23 1746 10/09/23 2149 10/10/23 0332  WBC 10.5  --  8.5  8.0  --   --   --   --   HGB 6.1*   < > 8.8*  8.7*   < > 8.4* 7.6* 7.6*  HCT 20.3*   < > 26.7*  26.6*   < > 25.0* 23.0* 22.7*  PLT 57*  --  39*  41*  --   --   --   --    < > = values in this interval not displayed.   BMET Recent Labs    10/08/23 1532 10/08/23 1547 10/09/23 0412  NA 136 136 134*  K 3.7  3.8 4.0  CL 101 103 100  CO2 16*  --  21*  GLUCOSE 157* 150* 164*  BUN 16 17 13   CREATININE 1.19 1.10 0.97  CALCIUM 8.4*  --  8.4*   LFT Recent Labs    10/08/23 1532  PROT 6.7  ALBUMIN 3.2*  AST 82*  ALT 30  ALKPHOS 26*  BILITOT 1.0   PT/INR Recent Labs    10/08/23 1532  LABPROT 16.8*  INR 1.3*    Studies/Results: US Abdomen Limited RUQ (LIVER/GB) Result Date: 10/10/2023 CLINICAL DATA:  History of cirrhosis. Cirrhosis versus steatosis. Technologist notes rule out cirrhosis. EXAM: ULTRASOUND ABDOMEN LIMITED RIGHT UPPER QUADRANT COMPARISON:  CT of the abdomen from 2 days prior FINDINGS: Gallbladder: No gallstones or wall thickening visualized. No sonographic Murphy sign noted by sonographer. Common bile duct: Diameter: 4 mm Liver: Echogenic liver correlating with steatosis by prior CT without contrast. Cirrhotic morphologic changes not depicted on this study. No evidence of liver mass portal vein is patent on color Doppler imaging with normal direction of blood flow towards the liver. IMPRESSION: Hepatic steatosis. No detected morphologic changes of cirrhosis, although certainly not a definitive test. Negative gallbladder. Electronically Signed   By: Tiburcio Pea M.D.   On: 10/10/2023 05:00   CT Angio Abd/Pel W and/or Wo Contrast Result Date: 10/08/2023 CLINICAL DATA:  Lower GI bleed EXAM: CTA ABDOMEN AND PELVIS WITHOUT AND WITH CONTRAST TECHNIQUE: Multidetector CT imaging of the abdomen and pelvis was performed using the standard protocol during bolus administration of intravenous contrast. Multiplanar reconstructed images and MIPs were obtained and reviewed to evaluate the vascular anatomy. RADIATION DOSE REDUCTION: This exam was performed according to the departmental dose-optimization program which includes automated exposure control, adjustment of the mA and/or kV according to patient size and/or use of iterative reconstruction technique. CONTRAST:  75mL OMNIPAQUE IOHEXOL 350  MG/ML SOLN COMPARISON:  CT 06/06/2023 FINDINGS: VASCULAR Aorta: Normal caliber aorta without aneurysm, dissection, vasculitis or significant stenosis. Moderate aortic atherosclerosis. Celiac: Patent without evidence of aneurysm, dissection, vasculitis or significant stenosis. SMA: Patent without evidence of aneurysm, dissection, vasculitis or significant stenosis. Renals: 2 left and single right renal arteries are patent without evidence of aneurysm, dissection, vasculitis, fibromuscular dysplasia or significant stenosis. IMA: Patent without evidence of aneurysm, dissection, vasculitis or significant stenosis. Inflow: Patent without evidence of aneurysm, dissection, vasculitis or significant stenosis. Proximal Outflow: Bilateral common femoral and visualized portions of the superficial and profunda femoral arteries are patent without evidence of aneurysm, dissection, vasculitis or significant stenosis. Veins: Patent portal and splenic veins. Review of the MIP images confirms the above findings. NON-VASCULAR Lower chest: Lung bases are clear Hepatobiliary: Hepatic steatosis. No calcified gallstone or biliary dilatation Pancreas: Possible mild stranding at the pancreatic head and uncinate process. No ductal dilatation Spleen: Normal in size without focal abnormality. Adrenals/Urinary Tract: Adrenal glands are normal. No hydronephrosis. Nonspecific perinephric fat stranding. Stable subcentimeter hypodense focus with punctate calcification at the mid left kidney for which no specific imaging follow-up is recommended. Bladder is normal Stomach/Bowel: Hyperdense material in the cecum, ascending and transverse colon. No definite intraluminal extravasation of contrast to suggest active GI bleeding. Negative appendix. No bowel wall thickening Lymphatic: No suspicious lymph nodes Reproductive: Negative prostate Other: Negative for free air or pelvic effusion Musculoskeletal: No acute or suspicious osseous abnormality  IMPRESSION: 1. Negative for acute aortic dissection or aneurysm. No significant stenosis or occlusive disease. 2. Negative for active GI bleeding on this exam. Hyperdense intraluminal material, most evident at the cecum, ascending and transverse colon, nonspecific but in the setting of lower GI bleed, could represent blood product/hemorrhagic material. Correlation with tagged RBC scan could be considered for further assessment. 3. Hepatic steatosis. 4. Possible mild stranding at the pancreatic head and uncinate process, correlate with serum enzymes for acute pancreatitis. 5. Aortic atherosclerosis. Electronically Signed   By: Jasmine Pang M.D.   On: 10/08/2023 21:20       Assessment / Plan:    #47 63 year old African-American male with EtOH use disorder without evidence of cirrhosis or portal hypertension who presented with acute GI bleed onset on 10/07/2023 with dark bloody bowel movements.  He had several episodes on 10/08/2023 then resulting in a syncopal episode at home and was brought to the emergency room. No associated abdominal pain, did have 1 episode of nonbloody emesis. Hemoglobin was 6.1 on presentation/platelets 57/pro time 16.8/INR 1.3.  CTA showed hepatic steatosis, no definite extravasation to suggest ongoing bleeding but there was a lot of hyperdense material in the colon in the cecum/ascending colon and transverse colon suggestive of blood  He has been transfused 2 units of packed RBCs, hemoglobin 8.7 yesterday> 7.6 this a.m.  Per CTA findings and per similar presentation in December 2024 at which time he was also admitted with acute GI bleeding and had a positive CTA at the base  of the cecum at that time we suspect that his bleeding is from his right colon/cecum  No active bleeding overnight  #2 anemia acute on chronic secondary to above #3 thrombocytopenia-which has been severe in the past down to 13,000 when he was admitted in December 2020 for  Platelets here 41  Hematology  consult yesterday still suggesting that this is all secondary to EtOH, though patient does not have clinical or radiographic evidence of cirrhosis and portal hypertension. Certainly he is having some component secondary to consumption with his episodes of acute GI bleeding Query if this could be ITP  #4 mild EtOH withdrawal, mentating well, tachycardic this a.m. #5 chronic kidney disease 6.  History of hyper 7.  Prior history of duodenal ulcer 2023 8.  History of adenomatous and hyperplastic polyps last colonoscopy 2019  Plan; clear liquids today Bowel prep with Nulytely this evening N.p.o. after midnight Patient will be scheduled for colonoscopy and EGD with Dr. Myrtie Neither for tomorrow 10/11/2023.  Procedures were discussed in detail with the patient including indications risk and benefits and he is agreeable to proceed Continue to trend hemoglobin every 6 hours and transfuse as indicated for hemoglobin 7 or less  Will follow-up platelet count later this morning and repeat CBC in a.m. We plan to give him platelets preprocedure tomorrow.  GI will continue to follow with you   Principal Problem:   ABLA (acute blood loss anemia) Active Problems:   Hepatic steatosis   Alcohol abuse   GI bleed   Alcohol withdrawal (HCC)   Abnormal finding on GI tract imaging   Melena     LOS: 2 days   Amy EsterwoodPA-C  10/10/2023, 10:01 AM  I have taken an interval history, thoroughly reviewed the chart and examined the patient. I agree with the Advanced Practitioner's note, impression and recommendations, and have recorded additional findings, impressions and recommendations below. I performed a substantive portion of this encounter (>50% time spent), including a complete performance of the medical decision making.  My additional thoughts are as follows:  Hemoglobin relatively stable since yesterday, and his bleeding seems to have either considerably slowed down or stopped at this point. Hematology  consultant saw this patient without additional insight or recommendations regarding thrombocytopenia beyond the December 2020 for consultation at the same clinical setting. As noted in our initial consult and above, this patient does not have convincing evidence of cirrhosis and portal hypertension, so there may yet be another explanation for his thrombocytopenia beyond liver disease despite his history of heavy alcohol use.  Our plan is for a colonoscopy and upper endoscopy tomorrow.  Colonoscopy to evaluate the probable source of bleeding based on his CT angiograms, though it is not clear if any therapeutic intervention will be feasible depending on what is found as well as his platelet count.  If his platelet count is 50,000 or below, he will be administered platelets prior to his procedure tomorrow.  We will make that decision in the morning when we see the results of his CBC. Upper endoscopy is to ensure that there is not some source of bleeding in the upper GI tract, particular with this ongoing question of the patient's liver disease.  He was agreeable to the procedures after a thorough description of them along with the risks and benefits.  The benefits and risks of the planned procedure(s) were described in detail with the patient or (when appropriate) their health care proxy.  Risks were outlined as including, but not limited to,  bleeding, infection, perforation, adverse medication reaction leading to cardiac or pulmonary decompensation, pancreatitis (if ERCP).  The limitation of incomplete mucosal visualization was also discussed.  No guarantees or warranties were given.  Patient at increased risk for cardiopulmonary complications of procedure due to medical comorbidities.    [name] Office:[phone]

## 2023-10-10 NOTE — Progress Notes (Signed)
   10/10/23 2030  What Happened  Was fall witnessed? No  Was patient injured? No  Patient found on floor  Found by Staff-comment Abundio Miu RN)  Stated prior activity to/from bed, chair, or stretcher  Provider Notification  Provider Name/Title Toniann Fail MD  Date Provider Notified 10/10/23  Time Provider Notified 2120  Method of Notification Page  Notification Reason Fall  Provider response See new orders  Date of Provider Response 10/10/23  Time of Provider Response 2120  Follow Up  Family notified No - patient refusal  Additional tests Yes-comment (Xray to pelvis)  Adult Fall Risk Assessment  Risk Factor Category (scoring not indicated) History of more than one fall within 6 months before admission (document High fall risk)  Patient Fall Risk Level High fall risk  Adult Fall Risk Interventions  Required Bundle Interventions *See Row Information* High fall risk - low, moderate, and high requirements implemented  Screening for Fall Injury Risk (To be completed on HIGH fall risk patients) - Assessing Need for Floor Mats  Risk For Fall Injury- Criteria for Floor Mats None identified - No additional interventions needed  Pain Assessment  Pain Scale 0-10  Pain Score 0  Neurological  Neuro (WDL) WDL  Level of Consciousness Alert  Orientation Level Oriented X4  Cognition Appropriate at baseline  Speech Clear  Neuro Symptoms None  Musculoskeletal  Musculoskeletal (WDL) X  Assistive Device None  Generalized Weakness Yes  Weight Bearing Restrictions Per Provider Order No  Integumentary  Integumentary (WDL) WDL

## 2023-10-10 NOTE — Progress Notes (Signed)
 PROGRESS NOTE    Stephen Bowers  ZOX:[id number] DOB: 01-23-1961 DOA: 10/08/2023 PCP: [name], MD    Chief Complaint  Patient presents with   Loss of Consciousness   GI Bleeding    Brief Narrative:  63 y.o. male with medical history significant for specialization for GI bleed and alcohol withdrawal 4 months ago.  He also has severe thrombocytopenia and history of cirrhosis The patient reports black tarry stools over the last 2 days.  CT angiogram abdomen and pelvis done with no acute bleeding noted however did show hyperdense material in the colon in the cecum/ascending colon and transverse colon suggestive of blood products. -GI consulted and patient for EGD colonoscopy 10/11/2023.   Assessment & Plan:   Principal Problem:   ABLA (acute blood loss anemia) Active Problems:   Hepatic steatosis   Alcohol abuse   GI bleed   Alcohol withdrawal (HCC)   Abnormal finding on GI tract imaging   Melena   Hypotension due to hypovolemia   #1 GI bleed/acute blood loss anemia/symptomatic anemia -Patient presenting with acute GI bleed with bloody bowel movements 2 days prior to admission. -CT angiogram abdomen and pelvis showed hepatic steatosis, no acute bleeding, hyperdense material in the colon in the cecum/ascending colon and transverse colon suggestive of blood products. -Posttransfusion 2 units PRBCs hemoglobin currently at 8.6 this morning. -Patient seen by hematology and do not feel patient's anemia hematological in nature. -Patient seen in consultation by GI and patient for EGD colonoscopy scheduled for 10/11/2023. -Continue IV PPI every 12 hours. -GI following and appreciate input and recommendations.  2.  Coagulopathy and thrombocytopenia -Patient with a history of thrombocytopenia, likely secondary to alcohol use. -Platelet count as low as 39K during this hospitalization currently at 58K today. -Patient seen in consultation by hematology/oncology, no clear  etiology. -Patient with no history of cirrhosis however does have a history of chronic alcohol use. -Right upper quadrant ultrasound obtained with hepatic steatosis, no detected morphologic changes of cirrhosis although certainly not a definitive test.  Negative gallbladder. -Per hematology transfuse for platelet count < 20k or <50k with active bleed or prior to procedures. -Per hematology/oncology.  3.  Alcohol use disorder -States only drinks on the weekends as he has to work on Mondays. -Patient with a history of excessive alcohol drinking. -Patient with history of severe DTs requiring Precedex and phenobarbital during last hospitalization. -Was on the Ativan CIWA protocol. -Place on the Librium detox protocol. -Continue thiamine, folic acid, multivitamin. -Alcohol cessation stressed to patient.  4.  Hypotension -Resolved with hydration and transfusion of PRBCs.   DVT prophylaxis: SCDs Code Status: Full Family Communication: No family at bedside. Disposition: Likely home when clinically improved and cleared by gastroenterology and oncology.  Status is: Inpatient Remains inpatient appropriate because: Severity of illness   Consultants:  Gastroenterology: Dr.Danis III Oncology: Dr. Cherly Hensen 10/09/2023  Procedures: Transfusion 2 units PRBCs 10/08/2023 CT angiogram abdomen and pelvis 10/08/2023 Right upper quadrant ultrasound 10/10/2023   Antimicrobials:  Anti-infectives (From admission, onward)    None         Subjective: Patient sitting up in chair eating an [place] ice.  Denies any chest pain or shortness of breath.  Denies any abdominal pain.  States had some pinkish-brown stool this morning.  Overall feeling well and much better than he did on admission.  Patient states he only drinks on the weekends and does not drink during the week or on Sundays.  Objective: Vitals:   10/10/23 1100  10/10/23 1621 10/10/23 1625 10/10/23 1918  BP: 102/65 (!) 89/65 (!) 90/52 118/65   Pulse: 74  (!) 110   Resp: 18  18 16   Temp: 98.9 F (37.2 C)  98.8 F (37.1 C) 98.3 F (36.8 C)  TempSrc: Oral  Oral Oral  SpO2: 98%  99% 94%  Weight:      Height:        Intake/Output Summary (Last 24 hours) at 10/10/2023 2148 Last data filed at 10/10/2023 0020 Gross per 24 hour  Intake --  Output 375 ml  Net -375 ml   Filed Weights   10/09/23 0743 10/09/23 1708  Weight: 86 kg 82.9 kg    Examination:  General exam: Appears calm and comfortable  Respiratory system: Clear to auscultation.  No wheezes, no crackles, no rhonchi.  Fair air movement.  Speaking in full sentences.  Respiratory effort normal. Cardiovascular system: Tachycardia. No JVD, murmurs, rubs, gallops or clicks. No pedal edema. Gastrointestinal system: Abdomen is nondistended, soft and nontender. No organomegaly or masses felt. Normal bowel sounds heard. Central nervous system: Alert and oriented. No focal neurological deficits. Extremities: Symmetric 5 x 5 power. Skin: No rashes, lesions or ulcers Psychiatry: Judgement and insight appear normal. Mood & affect appropriate.     Data Reviewed: I have personally reviewed following labs and imaging studies  CBC: Recent Labs  Lab 10/08/23 1532 10/08/23 1547 10/09/23 0412 10/09/23 1245 10/09/23 1746 10/09/23 2149 10/10/23 0332 10/10/23 0929 10/10/23 1554  WBC 10.5  --  8.5  8.0  --   --   --   --  7.9  --   NEUTROABS 8.3*  --  5.9  --   --   --   --  5.8  --   HGB 6.1*   < > 8.8*  8.7*   < > 8.4* 7.6* 7.6* 8.3* 8.6*  HCT 20.3*   < > 26.7*  26.6*   < > 25.0* 23.0* 22.7* 24.8* 26.2*  MCV 90.6  --  84.8  83.7  --   --   --   --  83.5  --   PLT 57*  --  39*  41*  --   --   --   --  58*  --    < > = values in this interval not displayed.    Basic Metabolic Panel: Recent Labs  Lab 10/08/23 1532 10/08/23 1547 10/09/23 0412 10/10/23 0929  NA 136 136 134* 132*  K 3.7 3.8 4.0 3.6  CL 101 103 100 100  CO2 16*  --  21* 22  GLUCOSE 157* 150* 164*  182*  BUN 16 17 13 8   CREATININE 1.19 1.10 0.97 0.94  CALCIUM 8.4*  --  8.4* 8.5*  MG  --   --  1.7 1.8    GFR: Estimated Creatinine Clearance: 87.9 mL/min (by C-G formula based on SCr of 0.94 mg/dL).  Liver Function Tests: Recent Labs  Lab 10/08/23 1532 10/10/23 0929  AST 82* 79*  ALT 30 25  ALKPHOS 26* 22*  BILITOT 1.0 1.0  PROT 6.7 6.4*  ALBUMIN 3.2* 3.2*    CBG: No results for input(s): "GLUCAP" in the last 168 hours.   Recent Results (from the past 240 hours)  MRSA Next Gen by PCR, Nasal     Status: None   Collection Time: 10/09/23  5:40 PM   Specimen: Nasal Mucosa; Nasal Swab  Result Value Ref Range Status   MRSA by PCR Next Gen NOT  DETECTED NOT DETECTED Final    Comment: (NOTE) The GeneXpert MRSA Assay (FDA approved for NASAL specimens only), is one component of a comprehensive MRSA colonization surveillance program. It is not intended to diagnose MRSA infection nor to guide or monitor treatment for MRSA infections. Test performance is not FDA approved in patients less than 56 years old. Performed at [facility], [address], [place], [place] [address]          Radiology Studies: US Abdomen Limited RUQ (LIVER/GB) Result Date: 10/10/2023 CLINICAL DATA:  History of cirrhosis. Cirrhosis versus steatosis. Technologist notes rule out cirrhosis. EXAM: ULTRASOUND ABDOMEN LIMITED RIGHT UPPER QUADRANT COMPARISON:  CT of the abdomen from 2 days prior FINDINGS: Gallbladder: No gallstones or wall thickening visualized. No sonographic Murphy sign noted by sonographer. Common bile duct: Diameter: 4 mm Liver: Echogenic liver correlating with steatosis by prior CT without contrast. Cirrhotic morphologic changes not depicted on this study. No evidence of liver mass portal vein is patent on color Doppler imaging with normal direction of blood flow towards the liver. IMPRESSION: Hepatic steatosis. No detected morphologic changes of cirrhosis, although certainly not a  definitive test. Negative gallbladder. Electronically Signed   By: Tiburcio Pea M.D.   On: 10/10/2023 05:00        Scheduled Meds:  chlordiazePOXIDE  25 mg Oral QID   Followed by   Melene Muller ON 10/11/2023] chlordiazePOXIDE  25 mg Oral TID   Followed by   Melene Muller ON 10/12/2023] chlordiazePOXIDE  25 mg Oral BH-qamhs   Followed by   Melene Muller ON 10/13/2023] chlordiazePOXIDE  25 mg Oral Daily   folic acid  1 mg Oral Daily   multivitamin with minerals  1 tablet Oral Daily   multivitamin with minerals  1 tablet Oral Daily   pantoprazole (PROTONIX) IV  40 mg Intravenous Q12H   thiamine  100 mg Oral Daily   Or   thiamine  100 mg Intravenous Daily   Continuous Infusions:   LOS: 2 days    Time spent: 40 minutes    Ramiro Harvest, MD [facility]   To contact the attending provider between 7A-7P or the covering provider during after hours 7P-7A, please log into the web site [email or web address] and access using universal [place] password for that web site. If you do not have the password, please call the hospital operator.  10/10/2023, 9:48 PM

## 2023-10-10 NOTE — Plan of Care (Signed)

## 2023-10-10 NOTE — Progress Notes (Addendum)
 Patient ID: Stephen Bowers, male   DOB: [birth year], 63 y.o.   MRN: [id number]    Progress Note   Subjective   Day # 2 CC; acute major GI bleed with syncope  Patient says his stools are now pinkish-brown looking this morning, no obvious grossly bloody stools overnight.  No complaints of abdominal pain, no nausea or vomiting, tolerating clear liquids  Labs this a.m. hemoglobin 7.6/hematocrit 22.7-platelets pending Ferritin 34 Abdominal ultrasound ordered per hematology, no evidence of cirrhosis, hepatic steatosis present, portal vein patent   Objective   Vital signs in last 24 hours: Temp:  [98.3 F (36.8 C)-99.3 F (37.4 C)] 98.9 F (37.2 C) (04/09 0800) Pulse Rate:  [66-147] 116 (04/09 0800) Resp:  [15-23] 20 (04/09 0800) BP: (105-141)/(62-101) 123/62 (04/09 0800) SpO2:  [90 %-100 %] 98 % (04/09 0400) Weight:  [82.9 kg] 82.9 kg (04/08 1708) Last BM Date : 10/09/23 General:    Older African-American male in NAD Heart: Tachy regular rate and rhythm; no murmurs Lungs: Respirations even and unlabored, lungs CTA bilaterally Abdomen:  Soft, nontender and nondistended. Normal bowel sounds. Extremities:  Without edema. Neurologic:  Alert and oriented,  grossly normal neurologically. Psych:  Cooperative. Normal mood and affect.  Intake/Output from previous day: 04/08 0701 - 04/09 0700 In: 760.8 [P.O.:240; I.V.:518.4; IV Piggyback:2.4] Out: 375 [Urine:375] Intake/Output this shift: No intake/output data recorded.  Lab Results: Recent Labs    10/08/23 1532 10/08/23 1547 10/09/23 0412 10/09/23 1245 10/09/23 1746 10/09/23 2149 10/10/23 0332  WBC 10.5  --  8.5  8.0  --   --   --   --   HGB 6.1*   < > 8.8*  8.7*   < > 8.4* 7.6* 7.6*  HCT 20.3*   < > 26.7*  26.6*   < > 25.0* 23.0* 22.7*  PLT 57*  --  39*  41*  --   --   --   --    < > = values in this interval not displayed.   BMET Recent Labs    10/08/23 1532 10/08/23 1547 10/09/23 0412  NA 136 136 134*  K 3.7  3.8 4.0  CL 101 103 100  CO2 16*  --  21*  GLUCOSE 157* 150* 164*  BUN 16 17 13   CREATININE 1.19 1.10 0.97  CALCIUM 8.4*  --  8.4*   LFT Recent Labs    10/08/23 1532  PROT 6.7  ALBUMIN 3.2*  AST 82*  ALT 30  ALKPHOS 26*  BILITOT 1.0   PT/INR Recent Labs    10/08/23 1532  LABPROT 16.8*  INR 1.3*    Studies/Results: US Abdomen Limited RUQ (LIVER/GB) Result Date: 10/10/2023 CLINICAL DATA:  History of cirrhosis. Cirrhosis versus steatosis. Technologist notes rule out cirrhosis. EXAM: ULTRASOUND ABDOMEN LIMITED RIGHT UPPER QUADRANT COMPARISON:  CT of the abdomen from 2 days prior FINDINGS: Gallbladder: No gallstones or wall thickening visualized. No sonographic Murphy sign noted by sonographer. Common bile duct: Diameter: 4 mm Liver: Echogenic liver correlating with steatosis by prior CT without contrast. Cirrhotic morphologic changes not depicted on this study. No evidence of liver mass portal vein is patent on color Doppler imaging with normal direction of blood flow towards the liver. IMPRESSION: Hepatic steatosis. No detected morphologic changes of cirrhosis, although certainly not a definitive test. Negative gallbladder. Electronically Signed   By: Tiburcio Pea M.D.   On: 10/10/2023 05:00   CT Angio Abd/Pel W and/or Wo Contrast Result Date: 10/08/2023 CLINICAL DATA:  Lower GI bleed EXAM: CTA ABDOMEN AND PELVIS WITHOUT AND WITH CONTRAST TECHNIQUE: Multidetector CT imaging of the abdomen and pelvis was performed using the standard protocol during bolus administration of intravenous contrast. Multiplanar reconstructed images and MIPs were obtained and reviewed to evaluate the vascular anatomy. RADIATION DOSE REDUCTION: This exam was performed according to the departmental dose-optimization program which includes automated exposure control, adjustment of the mA and/or kV according to patient size and/or use of iterative reconstruction technique. CONTRAST:  75mL OMNIPAQUE IOHEXOL 350  MG/ML SOLN COMPARISON:  CT 06/06/2023 FINDINGS: VASCULAR Aorta: Normal caliber aorta without aneurysm, dissection, vasculitis or significant stenosis. Moderate aortic atherosclerosis. Celiac: Patent without evidence of aneurysm, dissection, vasculitis or significant stenosis. SMA: Patent without evidence of aneurysm, dissection, vasculitis or significant stenosis. Renals: 2 left and single right renal arteries are patent without evidence of aneurysm, dissection, vasculitis, fibromuscular dysplasia or significant stenosis. IMA: Patent without evidence of aneurysm, dissection, vasculitis or significant stenosis. Inflow: Patent without evidence of aneurysm, dissection, vasculitis or significant stenosis. Proximal Outflow: Bilateral common femoral and visualized portions of the superficial and profunda femoral arteries are patent without evidence of aneurysm, dissection, vasculitis or significant stenosis. Veins: Patent portal and splenic veins. Review of the MIP images confirms the above findings. NON-VASCULAR Lower chest: Lung bases are clear Hepatobiliary: Hepatic steatosis. No calcified gallstone or biliary dilatation Pancreas: Possible mild stranding at the pancreatic head and uncinate process. No ductal dilatation Spleen: Normal in size without focal abnormality. Adrenals/Urinary Tract: Adrenal glands are normal. No hydronephrosis. Nonspecific perinephric fat stranding. Stable subcentimeter hypodense focus with punctate calcification at the mid left kidney for which no specific imaging follow-up is recommended. Bladder is normal Stomach/Bowel: Hyperdense material in the cecum, ascending and transverse colon. No definite intraluminal extravasation of contrast to suggest active GI bleeding. Negative appendix. No bowel wall thickening Lymphatic: No suspicious lymph nodes Reproductive: Negative prostate Other: Negative for free air or pelvic effusion Musculoskeletal: No acute or suspicious osseous abnormality  IMPRESSION: 1. Negative for acute aortic dissection or aneurysm. No significant stenosis or occlusive disease. 2. Negative for active GI bleeding on this exam. Hyperdense intraluminal material, most evident at the cecum, ascending and transverse colon, nonspecific but in the setting of lower GI bleed, could represent blood product/hemorrhagic material. Correlation with tagged RBC scan could be considered for further assessment. 3. Hepatic steatosis. 4. Possible mild stranding at the pancreatic head and uncinate process, correlate with serum enzymes for acute pancreatitis. 5. Aortic atherosclerosis. Electronically Signed   By: Jasmine Pang M.D.   On: 10/08/2023 21:20       Assessment / Plan:    #47 63 year old African-American male with EtOH use disorder without evidence of cirrhosis or portal hypertension who presented with acute GI bleed onset on 10/07/2023 with dark bloody bowel movements.  He had several episodes on 10/08/2023 then resulting in a syncopal episode at home and was brought to the emergency room. No associated abdominal pain, did have 1 episode of nonbloody emesis. Hemoglobin was 6.1 on presentation/platelets 57/pro time 16.8/INR 1.3.  CTA showed hepatic steatosis, no definite extravasation to suggest ongoing bleeding but there was a lot of hyperdense material in the colon in the cecum/ascending colon and transverse colon suggestive of blood  He has been transfused 2 units of packed RBCs, hemoglobin 8.7 yesterday> 7.6 this a.m.  Per CTA findings and per similar presentation in December 2024 at which time he was also admitted with acute GI bleeding and had a positive CTA at the base  of the cecum at that time we suspect that his bleeding is from his right colon/cecum  No active bleeding overnight  #2 anemia acute on chronic secondary to above #3 thrombocytopenia-which has been severe in the past down to 13,000 when he was admitted in December 2020 for  Platelets here 41  Hematology  consult yesterday still suggesting that this is all secondary to EtOH, though patient does not have clinical or radiographic evidence of cirrhosis and portal hypertension. Certainly he is having some component secondary to consumption with his episodes of acute GI bleeding Query if this could be ITP  #4 mild EtOH withdrawal, mentating well, tachycardic this a.m. #5 chronic kidney disease 6.  History of hyper 7.  Prior history of duodenal ulcer 2023 8.  History of adenomatous and hyperplastic polyps last colonoscopy 2019  Plan; clear liquids today Bowel prep with Nulytely this evening N.p.o. after midnight Patient will be scheduled for colonoscopy and EGD with Dr. Myrtie Neither for tomorrow 10/11/2023.  Procedures were discussed in detail with the patient including indications risk and benefits and he is agreeable to proceed Continue to trend hemoglobin every 6 hours and transfuse as indicated for hemoglobin 7 or less  Will follow-up platelet count later this morning and repeat CBC in a.m. We plan to give him platelets preprocedure tomorrow.  GI will continue to follow with you   Principal Problem:   ABLA (acute blood loss anemia) Active Problems:   Hepatic steatosis   Alcohol abuse   GI bleed   Alcohol withdrawal (HCC)   Abnormal finding on GI tract imaging   Melena     LOS: 2 days   Amy EsterwoodPA-C  10/10/2023, 10:01 AM  I have taken an interval history, thoroughly reviewed the chart and examined the patient. I agree with the Advanced Practitioner's note, impression and recommendations, and have recorded additional findings, impressions and recommendations below. I performed a substantive portion of this encounter (>50% time spent), including a complete performance of the medical decision making.  My additional thoughts are as follows:  Hemoglobin relatively stable since yesterday, and his bleeding seems to have either considerably slowed down or stopped at this point. Hematology  consultant saw this patient without additional insight or recommendations regarding thrombocytopenia beyond the December 2020 for consultation at the same clinical setting. As noted in our initial consult and above, this patient does not have convincing evidence of cirrhosis and portal hypertension, so there may yet be another explanation for his thrombocytopenia beyond liver disease despite his history of heavy alcohol use.  Our plan is for a colonoscopy and upper endoscopy tomorrow.  Colonoscopy to evaluate the probable source of bleeding based on his CT angiograms, though it is not clear if any therapeutic intervention will be feasible depending on what is found as well as his platelet count.  If his platelet count is 50,000 or below, he will be administered platelets prior to his procedure tomorrow.  We will make that decision in the morning when we see the results of his CBC. Upper endoscopy is to ensure that there is not some source of bleeding in the upper GI tract, particular with this ongoing question of the patient's liver disease.  He was agreeable to the procedures after a thorough description of them along with the risks and benefits.  The benefits and risks of the planned procedure(s) were described in detail with the patient or (when appropriate) their health care proxy.  Risks were outlined as including, but not limited to,  bleeding, infection, perforation, adverse medication reaction leading to cardiac or pulmonary decompensation, pancreatitis (if ERCP).  The limitation of incomplete mucosal visualization was also discussed.  No guarantees or warranties were given.  Patient at increased risk for cardiopulmonary complications of procedure due to medical comorbidities.    [name] Office:[phone]

## 2023-10-11 ENCOUNTER — Inpatient Hospital Stay (HOSPITAL_COMMUNITY): Admitting: Anesthesiology

## 2023-10-11 ENCOUNTER — Encounter (HOSPITAL_COMMUNITY)
Admission: EM | Disposition: A | Discharge status: Home / Self Care | Payer: Self-pay | Source: Home / Self Care | Attending: Internal Medicine

## 2023-10-11 ENCOUNTER — Encounter (HOSPITAL_COMMUNITY): Payer: Self-pay | Admitting: Internal Medicine

## 2023-10-11 DIAGNOSIS — K31819 Angiodysplasia of stomach and duodenum without bleeding: Secondary | ICD-10-CM

## 2023-10-11 DIAGNOSIS — K5520 Angiodysplasia of colon without hemorrhage: Secondary | ICD-10-CM

## 2023-10-11 DIAGNOSIS — Q438 Other specified congenital malformations of intestine: Secondary | ICD-10-CM | POA: Diagnosis not present

## 2023-10-11 DIAGNOSIS — K5521 Angiodysplasia of colon with hemorrhage: Secondary | ICD-10-CM

## 2023-10-11 DIAGNOSIS — K921 Melena: Secondary | ICD-10-CM

## 2023-10-11 DIAGNOSIS — E861 Hypovolemia: Secondary | ICD-10-CM | POA: Diagnosis not present

## 2023-10-11 DIAGNOSIS — D62 Acute posthemorrhagic anemia: Secondary | ICD-10-CM | POA: Diagnosis not present

## 2023-10-11 DIAGNOSIS — K648 Other hemorrhoids: Secondary | ICD-10-CM

## 2023-10-11 HISTORY — PX: ESOPHAGOGASTRODUODENOSCOPY: SHX5428

## 2023-10-11 HISTORY — PX: HOT HEMOSTASIS: SHX5433

## 2023-10-11 HISTORY — PX: COLONOSCOPY: SHX5424

## 2023-10-11 HISTORY — PX: HEMOSTASIS CLIP PLACEMENT: SHX6857

## 2023-10-11 LAB — CBC WITH DIFFERENTIAL/PLATELET
Abs Immature Granulocytes: 0.04 10*3/uL (ref 0.00–0.07)
Basophils Absolute: 0.1 10*3/uL (ref 0.0–0.1)
Basophils Relative: 1 %
Eosinophils Absolute: 0.1 10*3/uL (ref 0.0–0.5)
Eosinophils Relative: 2 %
HCT: 20.9 % — ABNORMAL LOW (ref 39.0–52.0)
Hemoglobin: 7 g/dL — ABNORMAL LOW (ref 13.0–17.0)
Immature Granulocytes: 1 %
Lymphocytes Relative: 14 %
Lymphs Abs: 1 10*3/uL (ref 0.7–4.0)
MCH: 28.2 pg (ref 26.0–34.0)
MCHC: 33.5 g/dL (ref 30.0–36.0)
MCV: 84.3 fL (ref 80.0–100.0)
Monocytes Absolute: 1.2 10*3/uL — ABNORMAL HIGH (ref 0.1–1.0)
Monocytes Relative: 18 %
Neutro Abs: 4.5 10*3/uL (ref 1.7–7.7)
Neutrophils Relative %: 64 %
Platelets: 59 10*3/uL — ABNORMAL LOW (ref 150–400)
RBC: 2.48 MIL/uL — ABNORMAL LOW (ref 4.22–5.81)
RDW: 18.3 % — ABNORMAL HIGH (ref 11.5–15.5)
WBC: 6.9 10*3/uL (ref 4.0–10.5)
nRBC: 0 % (ref 0.0–0.2)

## 2023-10-11 LAB — PREPARE RBC (CROSSMATCH)

## 2023-10-11 LAB — METHYLMALONIC ACID, SERUM: Methylmalonic Acid, Quantitative: 154 nmol/L (ref 0–378)

## 2023-10-11 LAB — HEMOGLOBIN AND HEMATOCRIT, BLOOD
HCT: 25.2 % — ABNORMAL LOW (ref 39.0–52.0)
HCT: 24.2 % — ABNORMAL LOW (ref 39.0–52.0)
Hemoglobin: 8.2 g/dL — ABNORMAL LOW (ref 13.0–17.0)
Hemoglobin: 8 g/dL — ABNORMAL LOW (ref 13.0–17.0)

## 2023-10-11 SURGERY — COLONOSCOPY
Anesthesia: Monitor Anesthesia Care

## 2023-10-11 MED ORDER — PROPOFOL 10 MG/ML IV BOLUS
INTRAVENOUS | Status: DC | PRN
  Administered 2023-10-11 (×2): 50 mg via INTRAVENOUS
  Administered 2023-10-11: 70 mg via INTRAVENOUS

## 2023-10-11 MED ORDER — EPHEDRINE SULFATE-NACL 50-0.9 MG/10ML-% IV SOSY
PREFILLED_SYRINGE | INTRAVENOUS | Status: DC | PRN
  Administered 2023-10-11: 5 mg via INTRAVENOUS

## 2023-10-11 MED ORDER — PROPOFOL 500 MG/50ML IV EMUL
INTRAVENOUS | Status: DC | PRN
  Administered 2023-10-11: 75 ug/kg/min via INTRAVENOUS

## 2023-10-11 MED ORDER — LIDOCAINE 2% (20 MG/ML) 5 ML SYRINGE
INTRAMUSCULAR | Status: DC | PRN
Start: 2023-10-11 — End: 2023-10-11
  Administered 2023-10-11: 100 mg via INTRAVENOUS

## 2023-10-11 MED ORDER — SODIUM CHLORIDE 0.9% IV SOLUTION: Freq: Once | INTRAVENOUS | Status: AC

## 2023-10-11 MED ORDER — PHENYLEPHRINE HCL-NACL 20-0.9 MG/250ML-% IV SOLN
INTRAVENOUS | Status: DC | PRN
  Administered 2023-10-11: 50 ug/min via INTRAVENOUS

## 2023-10-11 MED ORDER — SODIUM CHLORIDE 0.9 % IV SOLN: INTRAVENOUS | Status: DC | PRN

## 2023-10-11 MED ORDER — NA FERRIC GLUC CPLX IN SUCROSE 12.5 MG/ML IV SOLN
250.0000 mg | Freq: Once | INTRAVENOUS | Status: AC
  Administered 2023-10-11: 250 mg via INTRAVENOUS
  Filled 2023-10-11: qty 20

## 2023-10-11 MED ORDER — ACETAMINOPHEN 325 MG PO TABS
650.0000 mg | ORAL_TABLET | Freq: Once | ORAL | Status: AC
  Administered 2023-10-11: 650 mg via ORAL
  Filled 2023-10-11 (×2): qty 2

## 2023-10-11 MED ORDER — FUROSEMIDE 10 MG/ML IJ SOLN
20.0000 mg | Freq: Once | INTRAMUSCULAR | Status: AC
  Administered 2023-10-11: 20 mg via INTRAVENOUS
  Filled 2023-10-11: qty 2

## 2023-10-11 MED ORDER — PHENYLEPHRINE 80 MCG/ML (10ML) SYRINGE FOR IV PUSH (FOR BLOOD PRESSURE SUPPORT)
PREFILLED_SYRINGE | INTRAVENOUS | Status: DC | PRN
  Administered 2023-10-11 (×3): 160 ug via INTRAVENOUS
  Administered 2023-10-11: 80 ug via INTRAVENOUS

## 2023-10-11 MED ORDER — DIPHENHYDRAMINE HCL 25 MG PO CAPS
25.0000 mg | ORAL_CAPSULE | Freq: Once | ORAL | Status: AC
  Administered 2023-10-11: 25 mg via ORAL
  Filled 2023-10-11 (×2): qty 1

## 2023-10-11 NOTE — Anesthesia Preprocedure Evaluation (Addendum)
 Anesthesia Evaluation  Patient identified by MRN, date of birth, ID band Patient awake    Reviewed: Allergy & Precautions, NPO status , Patient's Chart, lab work & pertinent test results  Airway Mallampati: II  TM Distance: >3 FB Neck ROM: Full    Dental  (+) Edentulous Upper, Teeth Intact, Dental Advisory Given   Pulmonary former smoker   Pulmonary exam normal breath sounds clear to auscultation       Cardiovascular hypertension (133/85 preop), Normal cardiovascular exam Rhythm:Regular Rate:Normal     Neuro/Psych Seizures - (a/w etoh withdrawal), Well Controlled,   negative psych ROS   GI/Hepatic PUD,GERD  Medicated and Controlled,,(+) Cirrhosis     substance abuse  Acute GIB- black tarry stools   Endo/Other  negative endocrine ROS    Renal/GU negative Renal ROS  negative genitourinary   Musculoskeletal  (+) Arthritis , Osteoarthritis,    Abdominal   Peds  Hematology  (+) Blood dyscrasia, anemia Hb 7, plt 59   Anesthesia Other Findings   Reproductive/Obstetrics negative OB ROS                             Anesthesia Physical Anesthesia Plan  ASA: 4  Anesthesia Plan: MAC   Post-op Pain Management:    Induction:   PONV Risk Score and Plan: 2 and Propofol infusion and TIVA  Airway Management Planned: Natural Airway and Simple Face Mask  Additional Equipment: None  Intra-op Plan:   Post-operative Plan:   Informed Consent: I have reviewed the patients History and Physical, chart, labs and discussed the procedure including the risks, benefits and alternatives for the proposed anesthesia with the patient or authorized representative who has indicated his/her understanding and acceptance.       Plan Discussed with: CRNA  Anesthesia Plan Comments:         Anesthesia Quick Evaluation

## 2023-10-11 NOTE — Addendum Note (Signed)
 Addendum  created 10/11/23 1056 by Bethena Midget, MD   Intraprocedure Attestations deleted

## 2023-10-11 NOTE — Transfer of Care (Signed)
 Immediate Anesthesia Transfer of Care Note  Patient: Stephen Bowers  Procedure(s) Performed: COLONOSCOPY EGD (ESOPHAGOGASTRODUODENOSCOPY) ARGON PLASMA COAGULATION CONTROL OF HEMORRHAGE, GI TRACT, ENDOSCOPIC, BY CLIPPING OR OVERSEWING  Patient Location: PACU  Anesthesia Type:MAC  Level of Consciousness: awake and patient cooperative  Airway & Oxygen Therapy: Patient Spontanous Breathing and Patient connected to face mask oxygen  Post-op Assessment: Report given to RN and Post -op Vital signs reviewed and stable  Post vital signs: Reviewed and stable  Last Vitals:  Vitals Value Taken Time  BP    Temp    Pulse 98 10/11/23 1016  Resp 20 10/11/23 1016  SpO2 99 % 10/11/23 1016  Vitals shown include unfiled device data.  Last Pain:  Vitals:   10/11/23 0805  TempSrc: Temporal  PainSc: 0-No pain      Patients Stated Pain Goal: 0 (10/09/23 2145)  Complications: No notable events documented.

## 2023-10-11 NOTE — Anesthesia Postprocedure Evaluation (Signed)
 Anesthesia Post Note  Patient: Stephen Bowers  Procedure(s) Performed: COLONOSCOPY EGD (ESOPHAGOGASTRODUODENOSCOPY) ARGON PLASMA COAGULATION CONTROL OF HEMORRHAGE, GI TRACT, ENDOSCOPIC, BY CLIPPING OR OVERSEWING     Patient location during evaluation: PACU Anesthesia Type: MAC Level of consciousness: awake and alert Pain management: pain level controlled Vital Signs Assessment: post-procedure vital signs reviewed and stable Respiratory status: spontaneous breathing, nonlabored ventilation and respiratory function stable Cardiovascular status: blood pressure returned to baseline and stable Postop Assessment: no apparent nausea or vomiting Anesthetic complications: no   No notable events documented.  Last Vitals:  Vitals:   10/11/23 1030 10/11/23 1051  BP: 98/66 119/74  Pulse: 91 85  Resp: (!) 21 20  Temp:  36.6 C  SpO2: 94% 99%    Last Pain:  Vitals:   10/11/23 1051  TempSrc: Oral  PainSc: 0-No pain                 Lannie Fields

## 2023-10-11 NOTE — Interval H&P Note (Signed)
 History and Physical Interval Note:  10/11/2023 8:44 AM  Stephen Bowers  has presented today for surgery, with the diagnosis of acute GI bleeding.  The various methods of treatment have been discussed with the patient and family. After consideration of risks, benefits and other options for treatment, the patient has consented to  Procedure(s): COLONOSCOPY (N/A) EGD (ESOPHAGOGASTRODUODENOSCOPY) (N/A) as a surgical intervention.  The patient's history has been reviewed, patient examined, no change in status, stable for surgery.  I have reviewed the patient's chart and labs.  Questions were answered to the patient's satisfaction.    Plt 59k today, so no platelet transfusion before procedures.   Hgb 7.0  [name]

## 2023-10-11 NOTE — Op Note (Signed)
 [facility] Patient Name: Stephen Bowers Procedure Date : 10/11/2023 MRN: [id number] Attending MD: Starr Lake. Myrtie Neither , MD, [id number] Date of Birth: 07-Sep-1960 CSN: [id number] Age: 63 Admit Type: Inpatient Procedure:                Colonoscopy Indications:              Melena, Acute post hemorrhagic anemia, Abnormal CT                            of the GI tract (CTA suggesting bleeding source in                            proximal right colon)                           Chronic thrombocytopenia of unknown cause (see                            consult and subsequent notes) -platelets 59K today Providers:                Starr Lake. Myrtie Neither, MD, Lorenza Evangelist, RN, Rozetta Nunnery, Technician Referring MD:             [facility] hospitalist Medicines:                Monitored Anesthesia Care Complications:            No immediate complications. Estimated Blood Loss:     Estimated blood loss was minimal. Procedure:                Pre-Anesthesia Assessment:                           - Prior to the procedure, a History and Physical                            was performed, and patient medications and                            allergies were reviewed. The patient's tolerance of                            previous anesthesia was also reviewed. The risks                            and benefits of the procedure and the sedation                            options and risks were discussed with the patient.                            All questions were answered, and informed consent  was obtained. Prior Anticoagulants: The patient has                            taken no anticoagulant or antiplatelet agents. ASA                            Grade Assessment: III - A patient with severe                            systemic disease. After reviewing the risks and                            benefits, the patient was deemed in satisfactory                             condition to undergo the procedure.                           After obtaining informed consent, the colonoscope                            was passed under direct vision. Throughout the                            procedure, the patient's blood pressure, pulse, and                            oxygen saturations were monitored continuously. The                            CF-HQ190L ([id number]) Olympus coloscope was                            introduced through the anus and advanced to the the                            terminal ileum, with identification of the                            appendiceal orifice and IC valve. The colonoscopy                            was somewhat difficult due to a redundant colon.                            Successful completion of the procedure was aided by                            using manual pressure and straightening and                            shortening the scope to obtain bowel loop  reduction. The patient tolerated the procedure                            well. The quality of the bowel preparation was fair                            in some areas despite extensive lavage                            (particularly proximal right colon). The terminal                            ileum, ileocecal valve, appendiceal orifice, and                            rectum were photographed. The bowel preparation                            used was GoLYTELY via split dose instruction. Scope In: 9:11:51 AM Scope Out: 9:44:39 AM Scope Withdrawal Time: 0 hours 26 minutes 40 seconds  Total Procedure Duration: 0 hours 32 minutes 48 seconds  Findings:      The perianal and digital rectal examinations were normal.      The terminal ileum appeared normal.      Two small angioectasias without bleeding were found in the cecum, 1 of       which had a diminutive central erosion. Coagulation for hemostasis using       argon plasma was  successful. Scant self-limited posttreatment bleeding       from one of the sites. To prevent bleeding post-intervention (in a       patient with thrombocytopenia), four hemostatic clips were successfully       placed (MR conditional) -2 on each site. Clip manufacturer: [employer]. There was no bleeding at the end of the procedure.      Internal hemorrhoids were found. The hemorrhoids were large.      The exam was otherwise without abnormality on direct and retroflexion       views. Impression:               - Preparation of the colon was fair.                           - The examined portion of the ileum was normal.                           - Two non-bleeding colonic angioectasias. Treated                            with argon plasma coagulation (APC). Clips (MR                            conditional) were placed. Clip manufacturer: [employer].                           -  Internal hemorrhoids.                           - The examination was otherwise normal on direct                            and retroflexion views.                           - No specimens collected. Recommendation:           - Return patient to hospital ward for ongoing care.                           -Resume previous diet                           - Repeat colonoscopy in 1 year for polyp                            surveillance purposes with Dr. [name] at our office                            (history of hyperplastic polyps on 2019 colonoscopy                            but fair prep on that exam)                           See other procedure report for further                            recommendations. Procedure Code(s):        --- Professional ---                           [phone], Colonoscopy, flexible; with control of                            bleeding, any method Diagnosis Code(s):        --- Professional ---                           K64.8, Other hemorrhoids                            K55.20, Angiodysplasia of colon without hemorrhage                           K92.1, Melena (includes Hematochezia)                           D62, Acute posthemorrhagic anemia                           R93.3, Abnormal findings on diagnostic imaging of  other parts of digestive tract CPT copyright 2022 American [employer]. All rights reserved. The codes documented in this report are preliminary and upon coder review may  be revised to meet current compliance requirements. Nur Krasinski L. Myrtie Neither, MD 10/11/2023 10:17:34 AM This report has been signed electronically. Number of Addenda: 0

## 2023-10-11 NOTE — Plan of Care (Signed)

## 2023-10-11 NOTE — Op Note (Signed)
 [facility] Patient Name: Stephen Bowers Procedure Date : 10/11/2023 MRN: [id number] Attending MD: Starr Lake. Myrtie Neither , MD, [id number] Date of Birth: [birth year] CSN: [id number] Age: 63 Admit Type: Inpatient Procedure:                Upper GI endoscopy Indications:              Acute (on chronic) post hemorrhagic anemia, Melena                           See GI consult note for details and colonoscopy                            report from today Providers:                Starr Lake. Myrtie Neither, MD, Lorenza Evangelist, RN, Rozetta Nunnery, Technician Referring MD:             [facility] Medicines:                Monitored Anesthesia Care Complications:            No immediate complications. Estimated Blood Loss:     Estimated blood loss: none. Procedure:                Pre-Anesthesia Assessment:                           - Prior to the procedure, a History and Physical                            was performed, and patient medications and                            allergies were reviewed. The patient's tolerance of                            previous anesthesia was also reviewed. The risks                            and benefits of the procedure and the sedation                            options and risks were discussed with the patient.                            All questions were answered, and informed consent                            was obtained. Prior Anticoagulants: The patient has                            taken no anticoagulant or antiplatelet agents. ASA  Grade Assessment: III - A patient with severe                            systemic disease. After reviewing the risks and                            benefits, the patient was deemed in satisfactory                            condition to undergo the procedure.                           - Prior to the procedure, a History and Physical                            was  performed, and patient medications and                            allergies were reviewed. The patient's tolerance of                            previous anesthesia was also reviewed. The risks                            and benefits of the procedure and the sedation                            options and risks were discussed with the patient.                            All questions were answered, and informed consent                            was obtained. Prior Anticoagulants: The patient has                            taken no anticoagulant or antiplatelet agents. ASA                            Grade Assessment: III - A patient with severe                            systemic disease. After reviewing the risks and                            benefits, the patient was deemed in satisfactory                            condition to undergo the procedure.                           After obtaining informed consent, the endoscope was  passed under direct vision. Throughout the                            procedure, the patient's blood pressure, pulse, and                            oxygen saturations were monitored continuously. The                            [id number]-[id number] ([id number]) Olympus endoscope was introduced                            through the mouth, and advanced to the second part                            of duodenum. The upper GI endoscopy was                            accomplished without difficulty. The patient                            tolerated the procedure fairly well. Scope In: Scope Out: Findings:      The esophagus was normal.      A single diminutive angioectasia with no bleeding was found on the       greater curvature of the gastric body. Coagulation for hemostasis using       argon plasma was successful. To prevent bleeding post-intervention (in a       patient with thrombocytopenia), one hemostatic clip was successfully       placed (MR  conditional). Clip manufacturer: [employer]. There was       no bleeding at the end of the procedure.      The exam of the stomach was otherwise normal, including on retroflexion.      The examined duodenum was normal. Impression:               - Normal esophagus.                           - A single non-bleeding angioectasia in the                            stomach. Treated with argon plasma coagulation                            (APC). Clip (MR conditional) was placed. Clip                            manufacturer: [employer].                           - Normal examined duodenum.                           - No specimens collected. Recommendation:           - Return patient to hospital ward for ongoing care.                           -  Resume regular diet.                           - See colonoscopy report from today for further                            details                           In my opinion, this patient can be discharged home                            after transfusion of another unit of PRBCs for                            hemoglobin of 7.0 this morning.                           He needs close outpatient follow-up with hematology                            for his chronic thrombocytopenia.                           As previously noted in our consult and subsequent                            progress notes, there is no current clinical,                            radiographic or endoscopic evidence of cirrhosis or                            portal hypertension to explain this patient's                            thrombocytopenia. Consider ITP                           Iron supplements at the time of discharge. Procedure Code(s):        --- Professional ---                           [phone], Esophagogastroduodenoscopy, flexible,                            transoral; with control of bleeding, any method Diagnosis Code(s):        --- Professional ---                            K31.819, Angiodysplasia of stomach and duodenum                            without bleeding  D62, Acute posthemorrhagic anemia                           K92.1, Melena (includes Hematochezia) CPT copyright 2022 American [employer] Association. All rights reserved. The codes documented in this report are preliminary and upon coder review may  be revised to meet current compliance requirements. Johnda Billiot L. Myrtie Neither, MD 10/11/2023 10:23:10 AM This report has been signed electronically. Number of Addenda: 0

## 2023-10-11 NOTE — Progress Notes (Signed)
 Stephen Bowers   DOB:[birth year]   WG#:[id number]    ASSESSMENT & PLAN:  63 y.o.m with medical history significant for GI bleed, ulcers, gastric erosions, hepatis steatosis and previous heavy alcohol intake presented with syncope found profound anemia, thrombocytopenia. Melena resolved. He has thrombocytopenia with presented with acute medical conditions in the past.  Acute blood loss anemia From acute GIB Resolved. Low iron storage Hgb stable s/p transfusions Will give a dose of IV iron for low iron storage.  Thrombocytopenia Steatosis, elevated INR and GGT B12, folate adequate. Stable without transfusion. Recurrent on his recurrent acute process May follow up with outpatient lab in a few weeks  Discharge planning Will request outpatient follow up with lab in a few weeks.  All questions were answered.    Thank you for the consult. Will sign off. Please call if questions.  Melven Sartorius, MD 10/11/2023 5:13 PM  Subjective:  Stony reports feeling better. No bloody stool or melena. No other bleeding.  Objective:  Vitals:   10/11/23 1230 10/11/23 1522  BP: 122/71 101/65  Pulse: 99 77  Resp: 19 20  Temp: 98 F (36.7 C) 98 F (36.7 C)  SpO2: 100% 100%     Intake/Output Summary (Last 24 hours) at 10/11/2023 1713 Last data filed at 10/11/2023 1705 Gross per 24 hour  Intake 2349.5 ml  Output 1000 ml  Net 1349.5 ml    GENERAL: alert, no distress and comfortable LUNGS: normal breathing effort.   ABDOMEN: abdomen soft, non-tender and non-distended NEURO: alert with fluent speech   Labs:  Recent Labs    12/14/22 0703 12/15/22 0353 06/21/23 1422 10/08/23 1532 10/08/23 1547 10/09/23 0412 10/10/23 0929  NA 135   < > 141 136 136 134* 132*  K 3.0*   < > 4.4 3.7 3.8 4.0 3.6  CL 101   < > 108 101 103 100 100  CO2 21*   < > 26 16*  --  21* 22  GLUCOSE 115*   < > 115* 157* 150* 164* 182*  BUN 6   < > 9 16 17 13 8   CREATININE 0.80   < > 0.91 1.19 1.10 0.97 0.94   CALCIUM 8.1*   < > 9.0 8.4*  --  8.4* 8.5*  GFRNONAA >60   < > >60 >60  --  >60 >60  PROT 6.8   < > 7.5 6.7  --   --  6.4*  ALBUMIN 3.2*   < > 3.9 3.2*  --   --  3.2*  AST 110*   < > 63* 82*  --   --  79*  ALT 37   < > 32 30  --   --  25  ALKPHOS 31*   < > 38 26*  --   --  22*  BILITOT 2.0*   < > 0.4 1.0  --   --  1.0  BILIDIR 0.5*  --   --   --   --   --   --    < > = values in this interval not displayed.    Studies:  DG Pelvis 1-2 Views Result Date: 10/10/2023 CLINICAL DATA:  Pain after fall. EXAM: PELVIS - 1-2 VIEW COMPARISON:  None Available. FINDINGS: The cortical margins of the bony pelvis are intact. No fracture. Pubic symphysis and sacroiliac joints are congruent. Both femoral heads are well-seated in the respective acetabula. IMPRESSION: No pelvic fracture. Electronically Signed   By: Ivette Loyal.D.  On: 10/10/2023 22:20   US Abdomen Limited RUQ (LIVER/GB) Result Date: 10/10/2023 CLINICAL DATA:  History of cirrhosis. Cirrhosis versus steatosis. Technologist notes rule out cirrhosis. EXAM: ULTRASOUND ABDOMEN LIMITED RIGHT UPPER QUADRANT COMPARISON:  CT of the abdomen from 2 days prior FINDINGS: Gallbladder: No gallstones or wall thickening visualized. No sonographic Murphy sign noted by sonographer. Common bile duct: Diameter: 4 mm Liver: Echogenic liver correlating with steatosis by prior CT without contrast. Cirrhotic morphologic changes not depicted on this study. No evidence of liver mass portal vein is patent on color Doppler imaging with normal direction of blood flow towards the liver. IMPRESSION: Hepatic steatosis. No detected morphologic changes of cirrhosis, although certainly not a definitive test. Negative gallbladder. Electronically Signed   By: Tiburcio Pea M.D.   On: 10/10/2023 05:00   CT Angio Abd/Pel W and/or Wo Contrast Result Date: 10/08/2023 CLINICAL DATA:  Lower GI bleed EXAM: CTA ABDOMEN AND PELVIS WITHOUT AND WITH CONTRAST TECHNIQUE: Multidetector CT  imaging of the abdomen and pelvis was performed using the standard protocol during bolus administration of intravenous contrast. Multiplanar reconstructed images and MIPs were obtained and reviewed to evaluate the vascular anatomy. RADIATION DOSE REDUCTION: This exam was performed according to the departmental dose-optimization program which includes automated exposure control, adjustment of the mA and/or kV according to patient size and/or use of iterative reconstruction technique. CONTRAST:  75mL OMNIPAQUE IOHEXOL 350 MG/ML SOLN COMPARISON:  CT 06/06/2023 FINDINGS: VASCULAR Aorta: Normal caliber aorta without aneurysm, dissection, vasculitis or significant stenosis. Moderate aortic atherosclerosis. Celiac: Patent without evidence of aneurysm, dissection, vasculitis or significant stenosis. SMA: Patent without evidence of aneurysm, dissection, vasculitis or significant stenosis. Renals: 2 left and single right renal arteries are patent without evidence of aneurysm, dissection, vasculitis, fibromuscular dysplasia or significant stenosis. IMA: Patent without evidence of aneurysm, dissection, vasculitis or significant stenosis. Inflow: Patent without evidence of aneurysm, dissection, vasculitis or significant stenosis. Proximal Outflow: Bilateral common femoral and visualized portions of the superficial and profunda femoral arteries are patent without evidence of aneurysm, dissection, vasculitis or significant stenosis. Veins: Patent portal and splenic veins. Review of the MIP images confirms the above findings. NON-VASCULAR Lower chest: Lung bases are clear Hepatobiliary: Hepatic steatosis. No calcified gallstone or biliary dilatation Pancreas: Possible mild stranding at the pancreatic head and uncinate process. No ductal dilatation Spleen: Normal in size without focal abnormality. Adrenals/Urinary Tract: Adrenal glands are normal. No hydronephrosis. Nonspecific perinephric fat stranding. Stable subcentimeter  hypodense focus with punctate calcification at the mid left kidney for which no specific imaging follow-up is recommended. Bladder is normal Stomach/Bowel: Hyperdense material in the cecum, ascending and transverse colon. No definite intraluminal extravasation of contrast to suggest active GI bleeding. Negative appendix. No bowel wall thickening Lymphatic: No suspicious lymph nodes Reproductive: Negative prostate Other: Negative for free air or pelvic effusion Musculoskeletal: No acute or suspicious osseous abnormality IMPRESSION: 1. Negative for acute aortic dissection or aneurysm. No significant stenosis or occlusive disease. 2. Negative for active GI bleeding on this exam. Hyperdense intraluminal material, most evident at the cecum, ascending and transverse colon, nonspecific but in the setting of lower GI bleed, could represent blood product/hemorrhagic material. Correlation with tagged RBC scan could be considered for further assessment. 3. Hepatic steatosis. 4. Possible mild stranding at the pancreatic head and uncinate process, correlate with serum enzymes for acute pancreatitis. 5. Aortic atherosclerosis. Electronically Signed   By: Jasmine Pang M.D.   On: 10/08/2023 21:20

## 2023-10-11 NOTE — Interval H&P Note (Signed)
 History and Physical Interval Note:  10/11/2023 8:52 AM  Stephen Bowers  has presented today for surgery, with the diagnosis of acute GI bleeding.  The various methods of treatment have been discussed with the patient and family. After consideration of risks, benefits and other options for treatment, the patient has consented to  Procedure(s): COLONOSCOPY (N/A) EGD (ESOPHAGOGASTRODUODENOSCOPY) (N/A) as a surgical intervention.  The patient's history has been reviewed, patient examined, no change in status, stable for surgery.  I have reviewed the patient's chart and labs.  Questions were answered to the patient's satisfaction.    Plt 59 k, hgb 7.9 today.  Patient reports clear prep output  [name]

## 2023-10-11 NOTE — Progress Notes (Signed)
 PROGRESS NOTE    Stephen Bowers  QIO:[id number] DOB: [birth year] DOA: 10/08/2023 PCP: [name], MD    Chief Complaint  Patient presents with   Loss of Consciousness   GI Bleeding    Brief Narrative:  63 y.o. male with medical history significant for specialization for GI bleed and alcohol withdrawal 4 months ago.  He also has severe thrombocytopenia and history of cirrhosis The patient reports black tarry stools over the last 2 days.  CT angiogram abdomen and pelvis done with no acute bleeding noted however did show hyperdense material in the colon in the cecum/ascending colon and transverse colon suggestive of blood products. -GI consulted and patient for EGD colonoscopy 10/11/2023.   Assessment & Plan:   Principal Problem:   ABLA (acute blood loss anemia) Active Problems:   Hepatic steatosis   Alcohol abuse   GI bleed   Alcohol withdrawal (HCC)   Abnormal finding on GI tract imaging   Melena   Hypotension due to hypovolemia   Gastric AVM   AVM (arteriovenous malformation) of colon with hemorrhage   #1 GI bleed/acute blood loss anemia/symptomatic anemia -Patient presenting with acute GI bleed with bloody bowel movements 2 days prior to admission. -CT angiogram abdomen and pelvis showed hepatic steatosis, no acute bleeding, hyperdense material in the colon in the cecum/ascending colon and transverse colon suggestive of blood products. -Posttransfusion 2 units PRBCs hemoglobin currently at 7.0 this morning. -Patient seen by hematology and do not feel patient's anemia hematological in nature. -Anemia panel from 06/18/2023 with iron level of 17, ferritin of 39, iron sats of 5, TIBC of 358. -IV iron being ordered by hematology. -Transfuse 2 units PRBCs. -Patient seen in consultation by GI and patient status post EGD colonoscopy which was done this morning, 10/11/2023.  -EGD noted normal esophagus, single nonbleeding angiectasia in the stomach treated with argon plasma  coagulation, status post clip placement, normal duodenum. -Colonoscopy done noted to nonbleeding colonic angioectasias treated with argon plasma coagulation and clip placed.  Internal hemorrhoids noted. -Continue IV PPI every 12 hours and likely transition to oral PPI in the next 24 hours.[name] -GI following and appreciate input and recommendations.  2.  Coagulopathy and thrombocytopenia -Patient with a history of thrombocytopenia, likely secondary to alcohol use. -Platelet count as low as 39K during this hospitalization currently at 59K today. -Patient seen in consultation by hematology/oncology, no clear etiology. -Patient with no history of cirrhosis however does have a history of chronic alcohol use. -Right upper quadrant ultrasound obtained with hepatic steatosis, no detected morphologic changes of cirrhosis although certainly not a definitive test.  Negative gallbladder. -Per hematology transfuse for platelet count < 20k or <50k with active bleed or prior to procedures. -Will need outpatient follow-up with hematology/oncology for repeat labs and further assessment. -Per hematology/oncology.  3.  Alcohol use disorder -States only drinks on the weekends as he has to work on Mondays. -Patient with a history of excessive alcohol drinking. -Patient with history of severe DTs requiring Precedex and phenobarbital during last hospitalization. -Was on the Ativan CIWA protocol. -Continue Librium detox protocol. -Continue thiamine, folic acid, multivitamin. -Alcohol cessation stressed to patient.  4.  Hypotension -Resolved with hydration and transfusion of PRBCs.   DVT prophylaxis: SCDs Code Status: Full Family Communication: No family at bedside. Disposition: Likely home when clinically improved and cleared by gastroenterology and oncology.  Status is: Inpatient Remains inpatient appropriate because: Severity of illness   Consultants:  Gastroenterology: Dr.Danis III Hematology/oncology:  Dr. Cherly Hensen  10/09/2023  Procedures: Transfusion 2 units PRBCs 10/08/2023 CT angiogram abdomen and pelvis 10/08/2023 Right upper quadrant ultrasound 10/10/2023 Transfused units PRBCs 10/11/2023 pending EGD/colonoscopy 10/11/2023 per Dr.Danis III  Antimicrobials:  Anti-infectives (From admission, onward)    None         Subjective: Patient just returned from EGD/Colonoscopy. Denies any further bloody BM today.  Objective: Vitals:   10/11/23 1020 10/11/23 1030 10/11/23 1051 10/11/23 1129  BP: (!) 105/58 98/66 119/74 112/69  Pulse: 67 91 85   Resp: (!) 25 (!) 21 20 17   Temp:   97.8 F (36.6 C) 98.1 F (36.7 C)  TempSrc:   Oral Oral  SpO2: 97% 94% 99%   Weight:      Height:        Intake/Output Summary (Last 24 hours) at 10/11/2023 1155 Last data filed at 10/11/2023 1130 Gross per 24 hour  Intake 1850 ml  Output --  Net 1850 ml   Filed Weights   10/09/23 0743 10/09/23 1708  Weight: 86 kg 82.9 kg    Examination:  General exam: NAD Respiratory system: CTAB.  No wheezes, no crackles, no rhonchi.  Fair air movement.  Speaking in full sentences.   Cardiovascular system: Tachycardia.  No JVD, no murmurs rubs or gallops.  No lower extremity edema.  Gastrointestinal system: Abdomen is soft, nontender, nondistended, positive bowel sounds.  No rebound.  No guarding.  Central nervous system: Alert and oriented. No focal neurological deficits. Extremities: Symmetric 5 x 5 power. Skin: No rashes, lesions or ulcers Psychiatry: Judgement and insight appear normal. Mood & affect appropriate.     Data Reviewed: I have personally reviewed following labs and imaging studies  CBC: Recent Labs  Lab 10/08/23 1532 10/08/23 1547 10/09/23 0412 10/09/23 1245 10/10/23 0332 10/10/23 0929 10/10/23 1554 10/10/23 2210 10/11/23 0613  WBC 10.5  --  8.5  8.0  --   --  7.9  --   --  6.9  NEUTROABS 8.3*  --  5.9  --   --  5.8  --   --  4.5  HGB 6.1*   < > 8.8*  8.7*   < > 7.6* 8.3* 8.6* 7.5*  7.0*  HCT 20.3*   < > 26.7*  26.6*   < > 22.7* 24.8* 26.2* 22.7* 20.9*  MCV 90.6  --  84.8  83.7  --   --  83.5  --   --  84.3  PLT 57*  --  39*  41*  --   --  58*  --   --  59*   < > = values in this interval not displayed.    Basic Metabolic Panel: Recent Labs  Lab 10/08/23 1532 10/08/23 1547 10/09/23 0412 10/10/23 0929  NA 136 136 134* 132*  K 3.7 3.8 4.0 3.6  CL 101 103 100 100  CO2 16*  --  21* 22  GLUCOSE 157* 150* 164* 182*  BUN 16 17 13 8   CREATININE 1.19 1.10 0.97 0.94  CALCIUM 8.4*  --  8.4* 8.5*  MG  --   --  1.7 1.8    GFR: Estimated Creatinine Clearance: 87.9 mL/min (by C-G formula based on SCr of 0.94 mg/dL).  Liver Function Tests: Recent Labs  Lab 10/08/23 1532 10/10/23 0929  AST 82* 79*  ALT 30 25  ALKPHOS 26* 22*  BILITOT 1.0 1.0  PROT 6.7 6.4*  ALBUMIN 3.2* 3.2*    CBG: No results for input(s): "GLUCAP" in the last 168 hours.  Recent Results (from the past 240 hours)  MRSA Next Gen by PCR, Nasal     Status: None   Collection Time: 10/09/23  5:40 PM   Specimen: Nasal Mucosa; Nasal Swab  Result Value Ref Range Status   MRSA by PCR Next Gen NOT DETECTED NOT DETECTED Final    Comment: (NOTE) The GeneXpert MRSA Assay (FDA approved for NASAL specimens only), is one component of a comprehensive MRSA colonization surveillance program. It is not intended to diagnose MRSA infection nor to guide or monitor treatment for MRSA infections. Test performance is not FDA approved in patients less than 64 years old. Performed at [facility], [address] [place], [place] [address]          Radiology Studies: DG Pelvis 1-2 Views Result Date: 10/10/2023 CLINICAL DATA:  Pain after fall. EXAM: PELVIS - 1-2 VIEW COMPARISON:  None Available. FINDINGS: The cortical margins of the bony pelvis are intact. No fracture. Pubic symphysis and sacroiliac joints are congruent. Both femoral heads are well-seated in the respective acetabula. IMPRESSION:  No pelvic fracture. Electronically Signed   By: Narda Rutherford M.D.   On: 10/10/2023 22:20   US Abdomen Limited RUQ (LIVER/GB) Result Date: 10/10/2023 CLINICAL DATA:  History of cirrhosis. Cirrhosis versus steatosis. Technologist notes rule out cirrhosis. EXAM: ULTRASOUND ABDOMEN LIMITED RIGHT UPPER QUADRANT COMPARISON:  CT of the abdomen from 2 days prior FINDINGS: Gallbladder: No gallstones or wall thickening visualized. No sonographic Murphy sign noted by sonographer. Common bile duct: Diameter: 4 mm Liver: Echogenic liver correlating with steatosis by prior CT without contrast. Cirrhotic morphologic changes not depicted on this study. No evidence of liver mass portal vein is patent on color Doppler imaging with normal direction of blood flow towards the liver. IMPRESSION: Hepatic steatosis. No detected morphologic changes of cirrhosis, although certainly not a definitive test. Negative gallbladder. Electronically Signed   By: Tiburcio Pea M.D.   On: 10/10/2023 05:00        Scheduled Meds:  chlordiazePOXIDE  25 mg Oral TID   Followed by   Melene Muller ON 10/12/2023] chlordiazePOXIDE  25 mg Oral BH-qamhs   Followed by   Melene Muller ON 10/13/2023] chlordiazePOXIDE  25 mg Oral Daily   folic acid  1 mg Oral Daily   furosemide  20 mg Intravenous Once   multivitamin with minerals  1 tablet Oral Daily   multivitamin with minerals  1 tablet Oral Daily   pantoprazole (PROTONIX) IV  40 mg Intravenous Q12H   thiamine  100 mg Oral Daily   Or   thiamine  100 mg Intravenous Daily   Continuous Infusions:   LOS: 3 days    Time spent: 40 minutes    Ramiro Harvest, MD [facility]   To contact the attending provider between 7A-7P or the covering provider during after hours 7P-7A, please log into the web site [email or web address] and access using universal [place] password for that web site. If you do not have the password, please call the hospital operator.  10/11/2023, 11:55 AM

## 2023-10-12 ENCOUNTER — Other Ambulatory Visit (HOSPITAL_COMMUNITY): Payer: Self-pay

## 2023-10-12 DIAGNOSIS — K31819 Angiodysplasia of stomach and duodenum without bleeding: Secondary | ICD-10-CM | POA: Diagnosis not present

## 2023-10-12 DIAGNOSIS — F1010 Alcohol abuse, uncomplicated: Secondary | ICD-10-CM | POA: Diagnosis not present

## 2023-10-12 DIAGNOSIS — D62 Acute posthemorrhagic anemia: Secondary | ICD-10-CM | POA: Diagnosis not present

## 2023-10-12 DIAGNOSIS — K5521 Angiodysplasia of colon with hemorrhage: Secondary | ICD-10-CM | POA: Diagnosis not present

## 2023-10-12 LAB — TYPE AND SCREEN
ABO/RH(D): A POS
Antibody Screen: NEGATIVE
Unit division: 0
Unit division: 0
Unit division: 0
Unit division: 0
Unit division: 0

## 2023-10-12 LAB — BPAM RBC
Blood Product Expiration Date: 202505052359
Blood Product Expiration Date: 202505052359
Blood Product Expiration Date: 202505072359
Blood Product Expiration Date: 202505112359
Blood Product Expiration Date: 202505112359
ISSUE DATE / TIME: 202504071551
ISSUE DATE / TIME: 202504071720
ISSUE DATE / TIME: 202504072105
ISSUE DATE / TIME: 202504101157
ISSUE DATE / TIME: 202504102257
Unit Type and Rh: 5100
Unit Type and Rh: 6200
Unit Type and Rh: 6200
Unit Type and Rh: 6200
Unit Type and Rh: 6200

## 2023-10-12 LAB — BASIC METABOLIC PANEL WITH GFR
Anion gap: 9 (ref 5–15)
BUN: <5 mg/dL — ABNORMAL LOW (ref 8–23)
CO2: 21 mmol/L — ABNORMAL LOW (ref 22–32)
Calcium: 8.2 mg/dL — ABNORMAL LOW (ref 8.9–10.3)
Chloride: 102 mmol/L (ref 98–111)
Creatinine, Ser: 0.98 mg/dL (ref 0.61–1.24)
GFR, Estimated: >60 mL/min (ref >60)
Glucose, Bld: 123 mg/dL — ABNORMAL HIGH (ref 70–99)
Potassium: 2.9 mmol/L — ABNORMAL LOW (ref 3.5–5.1)
Sodium: 132 mmol/L — ABNORMAL LOW (ref 135–145)

## 2023-10-12 LAB — CBC WITH DIFFERENTIAL/PLATELET
Abs Immature Granulocytes: 0.05 10*3/uL (ref 0.00–0.07)
Basophils Absolute: 0.1 10*3/uL (ref 0.0–0.1)
Basophils Relative: 1 %
Eosinophils Absolute: 0.1 10*3/uL (ref 0.0–0.5)
Eosinophils Relative: 1 %
HCT: 26 % — ABNORMAL LOW (ref 39.0–52.0)
Hemoglobin: 9 g/dL — ABNORMAL LOW (ref 13.0–17.0)
Immature Granulocytes: 1 %
Lymphocytes Relative: 11 %
Lymphs Abs: 0.8 10*3/uL (ref 0.7–4.0)
MCH: 29.1 pg (ref 26.0–34.0)
MCHC: 34.6 g/dL (ref 30.0–36.0)
MCV: 84.1 fL (ref 80.0–100.0)
Monocytes Absolute: 1 10*3/uL (ref 0.1–1.0)
Monocytes Relative: 14 %
Neutro Abs: 5.2 10*3/uL (ref 1.7–7.7)
Neutrophils Relative %: 72 %
Platelets: 61 10*3/uL — ABNORMAL LOW (ref 150–400)
RBC: 3.09 MIL/uL — ABNORMAL LOW (ref 4.22–5.81)
RDW: 17.4 % — ABNORMAL HIGH (ref 11.5–15.5)
WBC: 7.1 10*3/uL (ref 4.0–10.5)
nRBC: 0.4 % — ABNORMAL HIGH (ref 0.0–0.2)

## 2023-10-12 LAB — MAGNESIUM: Magnesium: 1.5 mg/dL — ABNORMAL LOW (ref 1.7–2.4)

## 2023-10-12 MED ORDER — POTASSIUM CHLORIDE CRYS ER 20 MEQ PO TBCR
40.0000 meq | EXTENDED_RELEASE_TABLET | ORAL | Status: AC
  Administered 2023-10-12 (×2): 40 meq via ORAL
  Filled 2023-10-12 (×2): qty 2

## 2023-10-12 MED ORDER — ACETAMINOPHEN 325 MG PO TABS
650.0000 mg | ORAL_TABLET | ORAL | 2 refills | Status: AC | PRN
  Filled 2023-10-12: qty 100, 9d supply, fill #0

## 2023-10-12 MED ORDER — POLYSACCHARIDE IRON COMPLEX 150 MG PO CAPS
150.0000 mg | ORAL_CAPSULE | Freq: Every day | ORAL | Status: DC
  Administered 2023-10-12: 150 mg via ORAL
  Filled 2023-10-12: qty 1

## 2023-10-12 MED ORDER — MAGNESIUM OXIDE 400 MG PO TABS: 400.0000 mg | ORAL_TABLET | Freq: Two times a day (BID) | ORAL | Status: AC

## 2023-10-12 MED ORDER — MAGNESIUM SULFATE 4 GM/100ML IV SOLN
4.0000 g | Freq: Once | INTRAVENOUS | Status: AC
  Administered 2023-10-12: 4 g via INTRAVENOUS
  Filled 2023-10-12: qty 100

## 2023-10-12 MED ORDER — PANTOPRAZOLE SODIUM 40 MG PO TBEC
40.0000 mg | DELAYED_RELEASE_TABLET | Freq: Two times a day (BID) | ORAL | Status: DC
  Administered 2023-10-12: 40 mg via ORAL
  Filled 2023-10-12: qty 1

## 2023-10-12 NOTE — Plan of Care (Signed)

## 2023-10-12 NOTE — Discharge Summary (Signed)
 Physician Discharge Summary  Stephen Bowers:[id number] DOB: [birth year] DOA: 10/08/2023  PCP: [name], MD  Admit date: 10/08/2023 Discharge date: 10/12/2023  Time spent: 60 minutes  Recommendations for Outpatient Follow-up:  Follow-up with [name], MD in 1 to 2 weeks.  On follow-up patient will need a CBC done to follow-up on counts, patient also need a basic metabolic profile and magnesium level to follow-up on electrolytes and renal function. Follow-up with Dr. [name], gastroenterology in 1 month Follow-up with Dr. Cherly Hensen, hematology in 2 weeks.   Discharge Diagnoses:  Principal Problem:   ABLA (acute blood loss anemia) Active Problems:   Hepatic steatosis   Alcohol abuse   GI bleed   Alcohol withdrawal (HCC)   Abnormal finding on GI tract imaging   Melena   Hypotension due to hypovolemia   Gastric AVM   AVM (arteriovenous malformation) of colon with hemorrhage   Discharge Condition: Stable and improved.  Diet recommendation: Regular  Filed Weights   10/09/23 0743 10/09/23 1708  Weight: 86 kg 82.9 kg    History of present illness:  HPI per Dr.Claiborne Stephen Bowers is a 63 y.o. male with medical history significant for specialization for GI bleed and alcohol withdrawal 4 months ago.  He also has severe thrombocytopenia and history of cirrhosis The patient reports black tarry stools over the last 2 days.  He also has been dizzy.  He had another black tarry bowel movement today and when he got up from the toilet he must of passed out.  His wife found him and called EMS.  He denies any abdominal pain.  He has not had any chest pain or shortness of breath or fevers. In the emergency department he was found to have a hemoglobin of 6.1.  His initial blood pressure was 96/78.  The patient ended up getting 1 unit of uncrossed match blood.  His blood pressures have been better since that time and he is completing his third unit of packed red blood cells.   The patient had severe alcohol withdrawal during his last hospital stay but ended up leaving AGAINST MEDICAL ADVICE after about 4 days.  He is agreeable to stay at this time, but is worried about missing work and his paycheck.    Hospital Course:  #1 GI bleed/acute blood loss anemia/symptomatic anemia -Patient presented with acute GI bleed with bloody bowel movements 2 days prior to admission. -CT angiogram abdomen and pelvis showed hepatic steatosis, no acute bleeding, hyperdense material in the colon in the cecum/ascending colon and transverse colon suggestive of blood products. -Patient underwent transfusion of a total of 4 units of PRBCs with hemoglobin stabilizing at 9.0 by day of discharge. -Patient seen by hematology and do not feel patient's anemia hematological in nature. -Anemia panel from 06/18/2023 with iron level of 17, ferritin of 39, iron sats of 5, TIBC of 358. -IV iron ordered by hematology during the hospitalization and patient be discharged back on home regimen of oral iron supplementation.. -Patient seen in consultation by GI and patient status post EGD colonoscopy which was done, 10/11/2023.  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 -Colonoscopy done noted to nonbleeding colonic angioectasias treated with argon plasma coagulation and clip placed.  Internal hemorrhoids noted. -Patient maintained on IV PPI during the hospitalization and transition back to oral PPI daily.   -Patient improved clinically and be discharged home in stable and  improved condition with outpatient follow-up with GI, Dr. [name] in 1 month.    2.  Coagulopathy and thrombocytopenia -Patient with a history of thrombocytopenia, likely secondary to alcohol use. -Platelet count as low as 39K during this hospitalization and was up to 61K by day of discharge.  -Patient seen in consultation by  hematology/oncology, no clear etiology. -Patient with no history of cirrhosis however does have a history of chronic alcohol use. -Right upper quadrant ultrasound obtained with hepatic steatosis, no detected morphologic changes of cirrhosis although certainly not a definitive test.  Negative gallbladder. -Per hematology transfuse for platelet count < 20k or <50k with active bleed or prior to procedures. -Will need outpatient follow-up with hematology/oncology for repeat labs and further assessment.   3.  Alcohol use disorder -States only drinks on the weekends as he has to work on Mondays. -Patient with a history of excessive alcohol drinking. -Patient with history of severe DTs requiring Precedex and phenobarbital during last hospitalization. -Was on the Ativan CIWA protocol. -Patient placed on the Librium detox protocol during the hospitalization as well as thiamine, folic acid, multivitamin.   -Alcohol cessation stressed to patient.   -Outpatient follow-up.    4.  Hypotension -Resolved with hydration and transfusion of PRBCs.      Procedures: Transfusion 2 units PRBCs 10/08/2023 CT angiogram abdomen and pelvis 10/08/2023 Right upper quadrant ultrasound 10/10/2023 Transfused units PRBCs 10/11/2023 pending EGD/colonoscopy 10/11/2023 per Dr.Danis III Transfusion 2 units PRBCs 10/11/2023  Consultations: Gastroenterology: Dr.Danis III Hematology/oncology: Dr. Cherly Hensen 10/09/2023  Discharge Exam: Vitals:   10/12/23 1150 10/12/23 1528  BP: 135/69 109/71  Pulse:  76  Resp: 16 12  Temp: 98.8 F (37.1 C) 98.9 F (37.2 C)  SpO2: 98% 91%    General: NAD Cardiovascular: RRR no murmurs rubs or gallops.  No JVD.  No lower extremity edema. Respiratory: CTAB.  No wheezes, no crackles, no rhonchi.  Fair air movement.  Speaking in full sentences.  Discharge Instructions   Discharge Instructions     Ambulatory referral to Hematology / Oncology   Complete by: As directed    HFU needed with Dr  Cherly Hensen   Diet general   Complete by: As directed    Increase activity slowly   Complete by: As directed       Allergies as of 10/12/2023   No Known Allergies      Medication List     STOP taking these medications    ibuprofen 200 MG tablet Commonly known as: ADVIL       TAKE these medications    acetaminophen 325 MG tablet Commonly known as: Tylenol Take 2 tablets (650 mg total) by mouth every 4 (four) hours as needed.   diclofenac Sodium 1 % Gel Commonly known as: VOLTAREN Apply 4 g topically in the morning and at bedtime. Apply to bilateral knees   docusate sodium 100 MG capsule Commonly known as: COLACE Take 100 mg by mouth 2 (two) times a week. Take on Monday and Wednesday   Elderberry/Vitamin C/Zinc Chew Chew 1 Dose by mouth daily.   FeroSul 325 (65 Fe) MG tablet Generic drug: ferrous sulfate Take 1 tablet (325 mg total) by mouth daily with breakfast.   fexofenadine 180 MG tablet Commonly known as: ALLEGRA Take 180 mg by mouth daily.   folic acid 1 MG tablet Commonly known as: FOLVITE Take 1 tablet (1 mg total) by mouth daily.   magnesium oxide 400 MG tablet Commonly known as: MAG-OX Take 1 tablet (400  mg total) by mouth 2 (two) times daily for 7 days.   multivitamin with minerals Tabs tablet Take 1 tablet by mouth daily.   pantoprazole 40 MG tablet Commonly known as: PROTONIX Take 1 tablet (40 mg total) by mouth daily.   thiamine 100 MG tablet Commonly known as: VITAMIN B1 Take 1 tablet (100 mg total) by mouth daily.       No Known Allergies  Follow-up Information     [name], MD. Schedule an appointment as soon as possible for a visit in 1 week(s).   Specialty: Internal Medicine Why: f/u 1-2 weeks. Contact information: [address] [place] [address] [phone]         Melven Sartorius, MD. Schedule an appointment as soon as possible for a visit in 2 week(s).   Specialty: Oncology Contact  information: [address] [place] [address] [phone]         Beverley Fiedler, MD. Schedule an appointment as soon as possible for a visit in 1 month(s).   Specialty: Gastroenterology Contact information: [address] [place] [address] [phone]                  The results of significant diagnostics from this hospitalization (including imaging, microbiology, ancillary and laboratory) are listed below for reference.    Significant Diagnostic Studies: DG Pelvis 1-2 Views Result Date: 10/10/2023 CLINICAL DATA:  Pain after fall. EXAM: PELVIS - 1-2 VIEW COMPARISON:  None Available. FINDINGS: The cortical margins of the bony pelvis are intact. No fracture. Pubic symphysis and sacroiliac joints are congruent. Both femoral heads are well-seated in the respective acetabula. IMPRESSION: No pelvic fracture. Electronically Signed   By: Narda Rutherford M.D.   On: 10/10/2023 22:20   US Abdomen Limited RUQ (LIVER/GB) Result Date: 10/10/2023 CLINICAL DATA:  History of cirrhosis. Cirrhosis versus steatosis. Technologist notes rule out cirrhosis. EXAM: ULTRASOUND ABDOMEN LIMITED RIGHT UPPER QUADRANT COMPARISON:  CT of the abdomen from 2 days prior FINDINGS: Gallbladder: No gallstones or wall thickening visualized. No sonographic Murphy sign noted by sonographer. Common bile duct: Diameter: 4 mm Liver: Echogenic liver correlating with steatosis by prior CT without contrast. Cirrhotic morphologic changes not depicted on this study. No evidence of liver mass portal vein is patent on color Doppler imaging with normal direction of blood flow towards the liver. IMPRESSION: Hepatic steatosis. No detected morphologic changes of cirrhosis, although certainly not a definitive test. Negative gallbladder. Electronically Signed   By: Tiburcio Pea M.D.   On: 10/10/2023 05:00   CT Angio Abd/Pel W and/or Wo Contrast Result Date: 10/08/2023 CLINICAL DATA:  Lower GI bleed EXAM: CTA ABDOMEN  AND PELVIS WITHOUT AND WITH CONTRAST TECHNIQUE: Multidetector CT imaging of the abdomen and pelvis was performed using the standard protocol during bolus administration of intravenous contrast. Multiplanar reconstructed images and MIPs were obtained and reviewed to evaluate the vascular anatomy. RADIATION DOSE REDUCTION: This exam was performed according to the departmental dose-optimization program which includes automated exposure control, adjustment of the mA and/or kV according to patient size and/or use of iterative reconstruction technique. CONTRAST:  75mL OMNIPAQUE IOHEXOL 350 MG/ML SOLN COMPARISON:  CT 06/06/2023 FINDINGS: VASCULAR Aorta: Normal caliber aorta without aneurysm, dissection, vasculitis or significant stenosis. Moderate aortic atherosclerosis. Celiac: Patent without evidence of aneurysm, dissection, vasculitis or significant stenosis. SMA: Patent without evidence of aneurysm, dissection, vasculitis or significant stenosis. Renals: 2 left and single right renal arteries are patent without evidence of  aneurysm, dissection, vasculitis, fibromuscular dysplasia or significant stenosis. IMA: Patent without evidence of aneurysm, dissection, vasculitis or significant stenosis. Inflow: Patent without evidence of aneurysm, dissection, vasculitis or significant stenosis. Proximal Outflow: Bilateral common femoral and visualized portions of the superficial and profunda femoral arteries are patent without evidence of aneurysm, dissection, vasculitis or significant stenosis. Veins: Patent portal and splenic veins. Review of the MIP images confirms the above findings. NON-VASCULAR Lower chest: Lung bases are clear Hepatobiliary: Hepatic steatosis. No calcified gallstone or biliary dilatation Pancreas: Possible mild stranding at the pancreatic head and uncinate process. No ductal dilatation Spleen: Normal in size without focal abnormality. Adrenals/Urinary Tract: Adrenal glands are normal. No hydronephrosis.  Nonspecific perinephric fat stranding. Stable subcentimeter hypodense focus with punctate calcification at the mid left kidney for which no specific imaging follow-up is recommended. Bladder is normal Stomach/Bowel: Hyperdense material in the cecum, ascending and transverse colon. No definite intraluminal extravasation of contrast to suggest active GI bleeding. Negative appendix. No bowel wall thickening Lymphatic: No suspicious lymph nodes Reproductive: Negative prostate Other: Negative for free air or pelvic effusion Musculoskeletal: No acute or suspicious osseous abnormality IMPRESSION: 1. Negative for acute aortic dissection or aneurysm. No significant stenosis or occlusive disease. 2. Negative for active GI bleeding on this exam. Hyperdense intraluminal material, most evident at the cecum, ascending and transverse colon, nonspecific but in the setting of lower GI bleed, could represent blood product/hemorrhagic material. Correlation with tagged RBC scan could be considered for further assessment. 3. Hepatic steatosis. 4. Possible mild stranding at the pancreatic head and uncinate process, correlate with serum enzymes for acute pancreatitis. 5. Aortic atherosclerosis. Electronically Signed   By: Jasmine Pang M.D.   On: 10/08/2023 21:20    Microbiology: Recent Results (from the past 240 hours)  MRSA Next Gen by PCR, Nasal     Status: None   Collection Time: 10/09/23  5:40 PM   Specimen: Nasal Mucosa; Nasal Swab  Result Value Ref Range Status   MRSA by PCR Next Gen NOT DETECTED NOT DETECTED Final    Comment: (NOTE) The GeneXpert MRSA Assay (FDA approved for NASAL specimens only), is one component of a comprehensive MRSA colonization surveillance program. It is not intended to diagnose MRSA infection nor to guide or monitor treatment for MRSA infections. Test performance is not FDA approved in patients less than 44 years old. Performed at [facility], [address], [place],  [place] [address]      Labs: Basic Metabolic Panel: Recent Labs  Lab 10/08/23 1532 10/08/23 1547 10/09/23 0412 10/10/23 0929 10/12/23 0539  NA 136 136 134* 132* 132*  K 3.7 3.8 4.0 3.6 2.9*  CL 101 103 100 100 102  CO2 16*  --  21* 22 21*  GLUCOSE 157* 150* 164* 182* 123*  BUN 16 17 13 8  <5*  CREATININE 1.19 1.10 0.97 0.94 0.98  CALCIUM 8.4*  --  8.4* 8.5* 8.2*  MG  --   --  1.7 1.8 1.5*   Liver Function Tests: Recent Labs  Lab 10/08/23 1532 10/10/23 0929  AST 82* 79*  ALT 30 25  ALKPHOS 26* 22*  BILITOT 1.0 1.0  PROT 6.7 6.4*  ALBUMIN 3.2* 3.2*   Recent Labs  Lab 10/08/23 1532  LIPASE 59*   No results for input(s): "AMMONIA" in the last 168 hours. CBC: Recent Labs  Lab 10/08/23 1532 10/08/23 1547 10/09/23 7253 10/09/23 1245 10/10/23 6644 10/10/23 1554 10/10/23 2210 10/11/23 0347 10/11/23 1207 10/11/23 2134 10/12/23 4259  WBC 10.5  --  8.5  8.0  --  7.9  --   --  6.9  --   --  7.1  NEUTROABS 8.3*  --  5.9  --  5.8  --   --  4.5  --   --  5.2  HGB 6.1*   < > 8.8*  8.7*   < > 8.3*   < > 7.5* 7.0* 8.2* 8.0* 9.0*  HCT 20.3*   < > 26.7*  26.6*   < > 24.8*   < > 22.7* 20.9* 25.2* 24.2* 26.0*  MCV 90.6  --  84.8  83.7  --  83.5  --   --  84.3  --   --  84.1  PLT 57*  --  39*  41*  --  58*  --   --  59*  --   --  61*   < > = values in this interval not displayed.   Cardiac Enzymes: No results for input(s): "CKTOTAL", "CKMB", "CKMBINDEX", "TROPONINI" in the last 168 hours. BNP: BNP (last 3 results) No results for input(s): "BNP" in the last 8760 hours.  ProBNP (last 3 results) No results for input(s): "PROBNP" in the last 8760 hours.  CBG: No results for input(s): "GLUCAP" in the last 168 hours.     Signed:  Ramiro Harvest MD.  [facility] 10/12/2023, 3:50 PM

## 2023-10-15 ENCOUNTER — Telehealth: Payer: Self-pay

## 2023-10-15 ENCOUNTER — Encounter (HOSPITAL_COMMUNITY): Payer: Self-pay | Admitting: Gastroenterology

## 2023-10-15 NOTE — Transitions of Care (Post Inpatient/ED Visit) (Signed)
   10/15/2023  Name: Stephen Bowers MRN: [id number] DOB: 04-May-1961  Today's TOC FU Call Status: Today's TOC FU Call Status:: Successful TOC FU Call Completed TOC FU Call Complete Date: 10/15/23 Patient's Name and Date of Birth confirmed.  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  He said he has all of his meds and did not have any questions about the med regime.) Any new allergies since your discharge?: No Dietary orders reviewed?: Yes Type of Diet Ordered:: heart healthy, low sodium Do you have support at home?: Yes People in Home [RPT]: spouse  Medications Reviewed Today: Medications Reviewed Today   Medications were not reviewed in this encounter     Home Care and Equipment/Supplies: Were Home Health Services Ordered?: No Any new equipment or medical supplies ordered?: No  Functional Questionnaire: Do you need assistance with bathing/showering or dressing?: No Do you need assistance with meal preparation?: No Do you need assistance with eating?: No Do you have difficulty maintaining continence: No Do you need assistance with getting out of bed/getting out of a chair/moving?: No Do you have difficulty managing or taking your medications?: No  Follow up appointments reviewed: PCP Follow-up appointment confirmed?: Yes Date of PCP follow-up appointment?: 11/06/23 Follow-up Provider: Dr Lincoln Renshaw.   I offered him an  appointment to be seen by Angelique Barer, PA 10/31/2023 but he declined and said he wanted to wait to see Dr Lincoln Renshaw  I also explained to him that we have a MMU in the community and he can be seen by a provider before 11/06/2023 if needed. Specialist Hospital Follow-up appointment confirmed?: No Reason Specialist Follow-Up Not Confirmed: Patient has Specialist Provider Number and will Call for Appointment (The number for GI and oncology are on his AVS and he said he will need to call to schedule the appointments.) Do you need transportation to your follow-up appointment?: No Do you understand care options if your condition(s) worsen?: Yes-patient verbalized understanding    SIGNATURE Burnett Carson, RN

## 2023-10-16 ENCOUNTER — Telehealth: Payer: Self-pay | Admitting: Hematology and Oncology

## 2023-10-16 NOTE — Telephone Encounter (Signed)
 Spoke with patient confirming upcoming appointment

## 2023-10-18 ENCOUNTER — Other Ambulatory Visit: Payer: Self-pay

## 2023-10-22 ENCOUNTER — Telehealth: Payer: Self-pay

## 2023-10-22 ENCOUNTER — Other Ambulatory Visit: Payer: Self-pay

## 2023-10-22 DIAGNOSIS — D696 Thrombocytopenia, unspecified: Secondary | ICD-10-CM

## 2023-10-22 DIAGNOSIS — D649 Anemia, unspecified: Secondary | ICD-10-CM

## 2023-10-22 NOTE — Telephone Encounter (Signed)
Recall entered as requested

## 2023-10-22 NOTE — Telephone Encounter (Signed)
-----   Message from Amber Bail Pyrtle sent at 10/22/2023 12:48 PM EDT ----- Regarding: FW: Hospital follow-up Just ensuring 1 year surveillance colonoscopy recall in place ----- Message ----- From: Albertina Hugger, MD Sent: 10/12/2023   7:54 AM EDT To: Nannette Babe, MD Subject: Hospital follow-up                             Marijean Shouts,  Patient of yours here with recurrent lower GI bleed, source found and treated. Please see our consult and procedure notes for details. Mainly needs surveillance colonoscopy with you in about a year.  Fair prep on inpatient exam.  HD

## 2023-10-23 ENCOUNTER — Inpatient Hospital Stay (HOSPITAL_BASED_OUTPATIENT_CLINIC_OR_DEPARTMENT_OTHER): Admitting: Hematology and Oncology

## 2023-10-23 ENCOUNTER — Inpatient Hospital Stay: Attending: Hematology and Oncology

## 2023-10-23 VITALS — BP 119/82 | HR 98 | Temp 98.6°F | Resp 17 | Ht 71.0 in | Wt 189.5 lb

## 2023-10-23 DIAGNOSIS — D649 Anemia, unspecified: Secondary | ICD-10-CM | POA: Diagnosis present

## 2023-10-23 DIAGNOSIS — K922 Gastrointestinal hemorrhage, unspecified: Secondary | ICD-10-CM | POA: Insufficient documentation

## 2023-10-23 DIAGNOSIS — D696 Thrombocytopenia, unspecified: Secondary | ICD-10-CM

## 2023-10-23 LAB — CBC WITH DIFFERENTIAL (CANCER CENTER ONLY)
Abs Immature Granulocytes: 0.03 10*3/uL (ref 0.00–0.07)
Basophils Absolute: 0.1 10*3/uL (ref 0.0–0.1)
Basophils Relative: 1 %
Eosinophils Absolute: 0.1 10*3/uL (ref 0.0–0.5)
Eosinophils Relative: 2 %
HCT: 32.7 % — ABNORMAL LOW (ref 39.0–52.0)
Hemoglobin: 10.9 g/dL — ABNORMAL LOW (ref 13.0–17.0)
Immature Granulocytes: 0 %
Lymphocytes Relative: 18 %
Lymphs Abs: 1.3 10*3/uL (ref 0.7–4.0)
MCH: 29.1 pg (ref 26.0–34.0)
MCHC: 33.3 g/dL (ref 30.0–36.0)
MCV: 87.4 fL (ref 80.0–100.0)
Monocytes Absolute: 0.7 10*3/uL (ref 0.1–1.0)
Monocytes Relative: 10 %
Neutro Abs: 5.2 10*3/uL (ref 1.7–7.7)
Neutrophils Relative %: 69 %
Platelet Count: 338 10*3/uL (ref 150–400)
RBC: 3.74 MIL/uL — ABNORMAL LOW (ref 4.22–5.81)
RDW: 19.4 % — ABNORMAL HIGH (ref 11.5–15.5)
WBC Count: 7.5 10*3/uL (ref 4.0–10.5)
nRBC: 0 % (ref 0.0–0.2)

## 2023-10-23 LAB — CMP (CANCER CENTER ONLY)
ALT: 29 U/L (ref 0–44)
AST: 81 U/L — ABNORMAL HIGH (ref 15–41)
Albumin: 4 g/dL (ref 3.5–5.0)
Alkaline Phosphatase: 33 U/L — ABNORMAL LOW (ref 38–126)
Anion gap: 10 (ref 5–15)
BUN: 9 mg/dL (ref 8–23)
CO2: 22 mmol/L (ref 22–32)
Calcium: 9.1 mg/dL (ref 8.9–10.3)
Chloride: 105 mmol/L (ref 98–111)
Creatinine: 1.04 mg/dL (ref 0.61–1.24)
GFR, Estimated: >60 mL/min (ref >60)
Glucose, Bld: 101 mg/dL — ABNORMAL HIGH (ref 70–99)
Potassium: 4.4 mmol/L (ref 3.5–5.1)
Sodium: 137 mmol/L (ref 135–145)
Total Bilirubin: 0.4 mg/dL (ref 0.0–1.2)
Total Protein: 7.9 g/dL (ref 6.5–8.1)

## 2023-10-23 LAB — IRON AND IRON BINDING CAPACITY (CC-WL,HP ONLY)
Iron: 119 ug/dL (ref 45–182)
Saturation Ratios: 28 % (ref 17.9–39.5)
TIBC: 424 ug/dL (ref 250–450)
UIBC: 305 ug/dL (ref 117–376)

## 2023-10-23 NOTE — Progress Notes (Signed)
 Patient Care Team: Lawrance Presume, MD as PCP - General (Internal Medicine)  DIAGNOSIS:  Encounter Diagnosis  Name Primary?   Symptomatic anemia Yes    CHIEF COMPLIANT: Follow-up after recent hospitalization for severe blood loss anemia  HISTORY OF PRESENT ILLNESS:   History of Present Illness The patient, with a history of recurrent severe anemia, presents for follow-up after a recent hospital admission. He was admitted with severe anemia and received five pints of blood transfusion. During the hospital stay, he underwent a colonoscopy and endoscopy, but no source of bleeding was identified. Since discharge, he reports feeling much better and has returned to work. He has a history of alcohol consumption but reports a significant reduction in intake. He has been taking oral iron  supplements prescribed by his primary care physician. He has not noticed any blood in his stool recently.     ALLERGIES:  has no known allergies.  MEDICATIONS:  Current Outpatient Medications  Medication Sig Dispense Refill   acetaminophen  (TYLENOL ) 325 MG tablet Take 2 tablets (650 mg total) by mouth every 4 (four) hours as needed. 100 tablet 2   diclofenac Sodium (VOLTAREN) 1 % GEL Apply 4 g topically in the morning and at bedtime. Apply to bilateral knees     docusate sodium  (COLACE) 100 MG capsule Take 100 mg by mouth 2 (two) times a week. Take on Monday and Wednesday     ferrous sulfate  325 (65 FE) MG tablet Take 1 tablet (325 mg total) by mouth daily with breakfast. 100 tablet 0   fexofenadine (ALLEGRA) 180 MG tablet Take 180 mg by mouth daily.     folic acid  (FOLVITE ) 1 MG tablet Take 1 tablet (1 mg total) by mouth daily. 90 tablet 1   Misc Natural Products (ELDERBERRY/VITAMIN C/ZINC) CHEW Chew 1 Dose by mouth daily.     Multiple Vitamin (MULTIVITAMIN WITH MINERALS) TABS tablet Take 1 tablet by mouth daily.     pantoprazole  (PROTONIX ) 40 MG tablet Take 1 tablet (40 mg total) by mouth daily. 30  tablet 3   thiamine  (VITAMIN B1) 100 MG tablet Take 1 tablet (100 mg total) by mouth daily. 100 tablet 1   No current facility-administered medications for this visit.    PHYSICAL EXAMINATION: ECOG PERFORMANCE STATUS: 1 - Symptomatic but completely ambulatory  Vitals:   10/23/23 1525  BP: 119/82  Pulse: 98  Resp: 17  Temp: 98.6 F (37 C)  SpO2: 100%   Filed Weights   10/23/23 1525  Weight: 189 lb 8 oz (86 kg)      LABORATORY DATA:  I have reviewed the data as listed    Latest Ref Rng & Units 10/12/2023    5:39 AM 10/10/2023    9:29 AM 10/09/2023    4:12 AM  CMP  Glucose 70 - 99 mg/dL 102  725  366   BUN 8 - 23 mg/dL 5  8  13    Creatinine 0.61 - 1.24 mg/dL 4.40  3.47  4.25   Sodium 135 - 145 mmol/L 132  132  134   Potassium 3.5 - 5.1 mmol/L 2.9  3.6  4.0   Chloride 98 - 111 mmol/L 102  100  100   CO2 22 - 32 mmol/L 21  22  21    Calcium  8.9 - 10.3 mg/dL 8.2  8.5  8.4   Total Protein 6.5 - 8.1 g/dL  6.4    Total Bilirubin 0.0 - 1.2 mg/dL  1.0    Alkaline Phos  38 - 126 U/L  22    AST 15 - 41 U/L  79    ALT 0 - 44 U/L  25      Lab Results  Component Value Date   WBC 7.5 10/23/2023   HGB 10.9 (L) 10/23/2023   HCT 32.7 (L) 10/23/2023   MCV 87.4 10/23/2023   PLT 338 10/23/2023   NEUTROABS 5.2 10/23/2023    ASSESSMENT & PLAN:  Symptomatic anemia Recurrent GI bleed -CT angio done 06/06/23 shows small focus of endoluminal arterial contrast blush in cecal base likely source of bleed.  -history of previous gi bleeds.   Secondary to heavy alcohol abuse   2.  Severe anemia -likely due to acute GI bleeding  Hospitalization 10/08/2023-10/12/2023: Acute blood loss anemia from GI bleed (hemoglobin 6.1) received 4 units of PRBC, AG colonoscopy: Nonbleeding angiectasia, discharged home at hemoglobin 9 and platelets 61 ------------------------------------- Assessment and Plan Assessment & Plan Severe anemia Severe anemia required hospitalization and transfusion. Hemoglobin  improved to nearly 11. Stabilized with no immediate transfusion needed. Awaiting iron  test results to assess IV iron  necessity. - Continue oral iron  supplementation. - Await iron  test results to determine need for IV iron . - Recheck hemoglobin levels in six months.  Recurrent GI bleed Recent colonoscopy and endoscopy showed no active bleeding. Hemoglobin stabilized, indicating cessation of bleeding. Episodes are intermittent and self-limiting. - Monitor for signs of gastrointestinal bleeding. - Reassess in six months.  Alcohol use Reduced alcohol consumption reported. Alcohol may contribute to anemia and GI bleeding. - Encourage continued reduction in alcohol consumption.      No orders of the defined types were placed in this encounter.  The patient has a good understanding of the overall plan. he agrees with it. he will call with any problems that may develop before the next visit here. Total time spent: 30 mins including face to face time and time spent for planning, charting and co-ordination of care   Margert Sheerer, MD 10/23/23

## 2023-10-23 NOTE — Assessment & Plan Note (Signed)
 Recurrent GI bleed -CT angio done 06/06/23 shows small focus of endoluminal arterial contrast blush in cecal base likely source of bleed.  -history of previous gi bleeds.   Secondary to heavy alcohol abuse   2.  Severe anemia -likely due to acute GI bleeding  Hospitalization 10/08/2023-10/12/2023: Acute blood loss anemia from GI bleed (hemoglobin 6.1) received 4 units of PRBC, AG colonoscopy: Nonbleeding angiectasia, discharged home at hemoglobin 9 and platelets 61

## 2023-10-24 LAB — FERRITIN: Ferritin: 153 ng/mL (ref 24–336)

## 2023-11-06 ENCOUNTER — Ambulatory Visit: Admitting: Internal Medicine

## 2023-12-26 ENCOUNTER — Other Ambulatory Visit: Payer: Self-pay

## 2023-12-28 ENCOUNTER — Other Ambulatory Visit: Payer: Self-pay

## 2024-01-08 ENCOUNTER — Ambulatory Visit: Admitting: Internal Medicine

## 2024-02-20 ENCOUNTER — Other Ambulatory Visit: Payer: Self-pay

## 2024-02-20 ENCOUNTER — Other Ambulatory Visit: Payer: Self-pay | Admitting: Internal Medicine

## 2024-02-26 ENCOUNTER — Ambulatory Visit: Admitting: Internal Medicine

## 2024-02-27 ENCOUNTER — Other Ambulatory Visit: Payer: Self-pay

## 2024-02-29 ENCOUNTER — Other Ambulatory Visit: Payer: Self-pay

## 2024-03-01 ENCOUNTER — Other Ambulatory Visit: Payer: Self-pay

## 2024-04-22 ENCOUNTER — Inpatient Hospital Stay: Attending: Internal Medicine

## 2024-04-22 ENCOUNTER — Inpatient Hospital Stay: Admitting: Hematology and Oncology

## 2024-04-22 NOTE — Assessment & Plan Note (Deleted)
 Secondary to recurrent GI bleed due to heavy alcohol abuse -CT angio done 06/06/23 shows small focus of endoluminal arterial contrast blush in cecal base likely source of bleed.  -history of previous gi bleeds.   Hospitalization 10/08/2023-10/12/2023: Acute blood loss anemia from GI bleed (hemoglobin 6.1) received 4 units of PRBC, AG colonoscopy: Nonbleeding angiectasia, discharged home at hemoglobin 9 and platelets 61   Lab review: 10/23/2023: Hemoglobin 10.9, MCV 87.4, RDW 19.4, platelets 338

## 2024-04-23 ENCOUNTER — Other Ambulatory Visit

## 2024-04-23 ENCOUNTER — Ambulatory Visit: Admitting: Hematology and Oncology

## 2024-05-26 ENCOUNTER — Ambulatory Visit: Payer: Self-pay

## 2024-05-26 ENCOUNTER — Ambulatory Visit: Payer: Self-pay | Admitting: Internal Medicine

## 2024-05-26 NOTE — Telephone Encounter (Signed)
 FYI Only or Action Required?: Action required by provider: request for appointment.  Patient was last seen in primary care on 06/18/2023 by Vicci Barnie NOVAK, MD.  Called Nurse Triage reporting Pain.  Symptoms began several days ago.  Interventions attempted: Nothing.  Symptoms are: unchanged.  Triage Disposition: See Physician Within 24 Hours  Patient/caregiver understands and will follow disposition?: Yes   Patient's wife and patient called back and reports patient will not be able to make appt today.  Patient reports only wants appt with Dr. Vicci.  NT cancelled appt per patient's request.

## 2024-05-26 NOTE — Telephone Encounter (Signed)
 FYI Only or Action Required?: FYI only for provider: appointment scheduled on 05/26/2024.  Patient was last seen in primary care on 06/18/2023 by Vicci Barnie NOVAK, MD.  Called Nurse Triage reporting Pain.  Symptoms began a week ago.  Interventions attempted: Rest, hydration, or home remedies.  Symptoms are: gradually worsening.  Triage Disposition: See Physician Within 24 Hours  Patient/caregiver understands and will follow disposition?: Yes  Copied from CRM #[id number]. Topic: Clinical - Red Word Triage >> May 26, 2024  8:21 AM Willma SAUNDERS wrote: [facility] that prompted transfer to Nurse Triage: Patient has been experiencing pain in his both knees for the last week. Also needs to be seen to get medication refills. Reason for Disposition  [1] Painful rash AND [2] multiple small blisters grouped together (i.e., dermatomal distribution or band or stripe)  Answer Assessment - Initial Assessment Questions 1. ONSET: When did the pain start?      X week 2. LOCATION: Where is the pain located?      Bilateral knees 3. PAIN: How bad is the pain?    (Scale 1-10; or mild, moderate, severe)     Moderate to severe 4. WORK OR EXERCISE: Has there been any recent work or exercise that involved this part of the body?      no 5. CAUSE: What do you think is causing the leg pain?     unknown 6. OTHER SYMPTOMS: Do you have any other symptoms? (e.g., chest pain, back pain, breathing difficulty, swelling, rash, fever, numbness, weakness)     Swelling 7. PREGNANCY: Is there any chance you are pregnant? When was your last menstrual period?     Na  And patient needs refills on all his medications  Protocols used: Leg Pain-A-AH

## 2024-05-26 NOTE — Telephone Encounter (Signed)
 Will document on previous triage note.

## 2024-05-26 NOTE — Telephone Encounter (Signed)
  Copied from CRM #[id number]. Topic: Clinical - Red Word Triage >> May 26, 2024  8:21 AM Willma SAUNDERS wrote: [facility] that prompted transfer to Nurse Triage: Patient has been experiencing pain in his both knees for the last week. Also needs to be seen to get medication refills. >> May 26, 2024  8:54 AM Antwanette L wrote: [place], the pt wife is calling to r/s appt

## 2024-06-12 ENCOUNTER — Ambulatory Visit: Admitting: Internal Medicine

## 2024-08-05 ENCOUNTER — Ambulatory Visit: Payer: Self-pay | Admitting: Internal Medicine
